# Patient Record
Sex: Female | Born: 1940
Health system: Southern US, Community
[De-identification: ages and names within clinical notes are randomized; demographics above are authoritative.]

## PROBLEM LIST (undated history)

## (undated) DIAGNOSIS — Z1211 Encounter for screening for malignant neoplasm of colon: Secondary | ICD-10-CM

## (undated) DIAGNOSIS — E7800 Pure hypercholesterolemia, unspecified: Secondary | ICD-10-CM

## (undated) DIAGNOSIS — N6459 Other signs and symptoms in breast: Secondary | ICD-10-CM

## (undated) DIAGNOSIS — K219 Gastro-esophageal reflux disease without esophagitis: Secondary | ICD-10-CM

## (undated) DIAGNOSIS — I480 Paroxysmal atrial fibrillation: Secondary | ICD-10-CM

## (undated) DIAGNOSIS — I2510 Atherosclerotic heart disease of native coronary artery without angina pectoris: Secondary | ICD-10-CM

## (undated) DIAGNOSIS — C801 Malignant (primary) neoplasm, unspecified: Secondary | ICD-10-CM

## (undated) DIAGNOSIS — IMO0002 Reserved for concepts with insufficient information to code with codable children: Secondary | ICD-10-CM

## (undated) DIAGNOSIS — I5189 Other ill-defined heart diseases: Secondary | ICD-10-CM

## (undated) DIAGNOSIS — Z9889 Other specified postprocedural states: Secondary | ICD-10-CM

## (undated) DIAGNOSIS — N3010 Interstitial cystitis (chronic) without hematuria: Secondary | ICD-10-CM

## (undated) DIAGNOSIS — Z1239 Encounter for other screening for malignant neoplasm of breast: Secondary | ICD-10-CM

## (undated) DIAGNOSIS — R55 Syncope and collapse: Secondary | ICD-10-CM

## (undated) DIAGNOSIS — Z87891 Personal history of nicotine dependence: Secondary | ICD-10-CM

## (undated) DIAGNOSIS — I10 Essential (primary) hypertension: Secondary | ICD-10-CM

## (undated) DIAGNOSIS — R112 Nausea with vomiting, unspecified: Secondary | ICD-10-CM

## (undated) HISTORY — DX: Personal history of nicotine dependence: Z87.891

## (undated) HISTORY — DX: Other ill-defined heart diseases: I51.89

## (undated) HISTORY — DX: Syncope and collapse: R55

## (undated) HISTORY — DX: Malignant (primary) neoplasm, unspecified: C80.1

## (undated) HISTORY — DX: Atherosclerotic heart disease of native coronary artery without angina pectoris: I25.10

## (undated) HISTORY — PX: APPENDECTOMY: SHX54

## (undated) HISTORY — DX: Pure hypercholesterolemia, unspecified: E78.00

## (undated) HISTORY — PX: CERVICAL DISCECTOMY: SHX98

## (undated) HISTORY — DX: Essential (primary) hypertension: I10

## (undated) HISTORY — DX: Interstitial cystitis (chronic) without hematuria: N30.10

## (undated) HISTORY — PX: DILATION AND CURETTAGE OF UTERUS: SHX78

## (undated) HISTORY — PX: MELANOMA EXCISION: SHX5266

## (undated) HISTORY — DX: Gastro-esophageal reflux disease without esophagitis: K21.9

## (undated) HISTORY — DX: Reserved for concepts with insufficient information to code with codable children: IMO0002

## (undated) HISTORY — DX: Encounter for other screening for malignant neoplasm of breast: Z12.39

## (undated) HISTORY — DX: Paroxysmal atrial fibrillation: I48.0

## (undated) HISTORY — DX: Other signs and symptoms in breast: N64.59

## (undated) HISTORY — DX: Encounter for screening for malignant neoplasm of colon: Z12.11

---

## 1988-09-29 HISTORY — PX: CHOLECYSTECTOMY: SHX55

## 1990-09-29 DIAGNOSIS — C801 Malignant (primary) neoplasm, unspecified: Secondary | ICD-10-CM

## 1990-09-29 HISTORY — PX: ABDOMINAL HYSTERECTOMY: SHX81

## 1990-09-29 HISTORY — DX: Malignant (primary) neoplasm, unspecified: C80.1

## 1999-12-11 ENCOUNTER — Encounter: Payer: Self-pay | Admitting: Emergency Medicine

## 1999-12-11 ENCOUNTER — Inpatient Hospital Stay (HOSPITAL_COMMUNITY): Admission: EM | Admit: 1999-12-11 | Discharge: 1999-12-12 | Payer: Self-pay | Admitting: Emergency Medicine

## 2000-08-05 ENCOUNTER — Observation Stay (HOSPITAL_COMMUNITY): Admission: RE | Admit: 2000-08-05 | Discharge: 2000-08-07 | Payer: Self-pay | Admitting: Neurosurgery

## 2000-09-04 ENCOUNTER — Encounter: Admission: RE | Admit: 2000-09-04 | Discharge: 2000-09-04 | Payer: Self-pay | Admitting: Neurosurgery

## 2000-12-04 ENCOUNTER — Encounter: Admission: RE | Admit: 2000-12-04 | Discharge: 2000-12-04 | Payer: Self-pay | Admitting: Neurosurgery

## 2004-11-21 ENCOUNTER — Ambulatory Visit: Payer: Self-pay | Admitting: Internal Medicine

## 2004-12-31 ENCOUNTER — Ambulatory Visit: Payer: Self-pay | Admitting: Internal Medicine

## 2005-06-04 ENCOUNTER — Ambulatory Visit: Payer: Self-pay | Admitting: Unknown Physician Specialty

## 2005-06-12 ENCOUNTER — Ambulatory Visit: Payer: Self-pay | Admitting: Unknown Physician Specialty

## 2005-11-06 ENCOUNTER — Ambulatory Visit: Payer: Self-pay | Admitting: Unknown Physician Specialty

## 2006-01-07 ENCOUNTER — Ambulatory Visit: Payer: Self-pay | Admitting: Internal Medicine

## 2006-06-25 ENCOUNTER — Ambulatory Visit: Payer: Self-pay | Admitting: Internal Medicine

## 2006-09-29 DIAGNOSIS — E7800 Pure hypercholesterolemia, unspecified: Secondary | ICD-10-CM

## 2006-09-29 HISTORY — DX: Pure hypercholesterolemia, unspecified: E78.00

## 2006-11-27 ENCOUNTER — Ambulatory Visit: Payer: Self-pay | Admitting: Urology

## 2007-01-25 ENCOUNTER — Ambulatory Visit: Payer: Self-pay | Admitting: Internal Medicine

## 2007-03-30 DIAGNOSIS — C44722 Squamous cell carcinoma of skin of right lower limb, including hip: Secondary | ICD-10-CM

## 2007-03-30 DIAGNOSIS — C4491 Basal cell carcinoma of skin, unspecified: Secondary | ICD-10-CM

## 2007-03-30 HISTORY — DX: Squamous cell carcinoma of skin of right lower limb, including hip: C44.722

## 2007-03-30 HISTORY — DX: Basal cell carcinoma of skin, unspecified: C44.91

## 2007-11-19 ENCOUNTER — Encounter (INDEPENDENT_AMBULATORY_CARE_PROVIDER_SITE_OTHER): Payer: Self-pay | Admitting: Urology

## 2007-11-19 ENCOUNTER — Ambulatory Visit (HOSPITAL_BASED_OUTPATIENT_CLINIC_OR_DEPARTMENT_OTHER): Admission: RE | Admit: 2007-11-19 | Discharge: 2007-11-19 | Payer: Self-pay | Admitting: Urology

## 2007-11-25 ENCOUNTER — Ambulatory Visit: Payer: Self-pay | Admitting: Internal Medicine

## 2008-02-18 ENCOUNTER — Ambulatory Visit: Payer: Self-pay | Admitting: Internal Medicine

## 2008-03-28 DIAGNOSIS — C4492 Squamous cell carcinoma of skin, unspecified: Secondary | ICD-10-CM

## 2008-03-28 HISTORY — DX: Squamous cell carcinoma of skin, unspecified: C44.92

## 2009-02-08 ENCOUNTER — Ambulatory Visit: Payer: Self-pay | Admitting: Internal Medicine

## 2009-02-21 ENCOUNTER — Ambulatory Visit: Payer: Self-pay | Admitting: Internal Medicine

## 2010-01-15 ENCOUNTER — Ambulatory Visit: Payer: Self-pay | Admitting: Unknown Physician Specialty

## 2010-05-20 ENCOUNTER — Ambulatory Visit: Payer: Self-pay | Admitting: Internal Medicine

## 2010-12-09 ENCOUNTER — Ambulatory Visit: Payer: Self-pay | Admitting: Internal Medicine

## 2011-02-11 NOTE — Op Note (Signed)
Rachael Austin, Rachael Austin NO.:  [id number]   MEDICAL RECORD NO.:  [id number]          PATIENT TYPE:  AMB   LOCATION:  [facility]                         FACILITY:  [facility]   PHYSICIAN:  Jamison Neighbor, M.D.  DATE OF BIRTH:  1941-01-06   DATE OF PROCEDURE:  11/19/2007  DATE OF DISCHARGE:                               OPERATIVE REPORT   PREOPERATIVE DIAGNOSIS:  Interstitial cystitis.   POSTOPERATIVE DIAGNOSIS:  Interstitial cystitis.   OPERATION PERFORMED:  1. Cystoscopy.  2. Urethral calibration.  3. Hydrodistention of the bladder with Marcaine and Pyridium      instillation.  4. Marcaine and Kenalog injection.  5. Bladder biopsy.   SURGEON:  Jamison Neighbor, M.D.   ANESTHESIA:  General.   COMPLICATIONS:  None.   DRAINS:  None.   BRIEF HISTORY:  This 70 year old female has had a tentative diagnosis of  interstitial cystitis made on clinical grounds.  She was initially seen  and evaluated by Dr. Assunta Gambles over in [place].  Review of the  records indicated that she had a pubovaginal sling placed for correction  of stress incontinence.  The patient has tried to for medication and has  had a whole lot of improvement.  She has not had  instillation therapy.  She would like to have a formal cystoscopy and hydrodistention to  determine if she does indeed have interstitial cystitis and to render  her a second opinion as to the best method of treatment that problem  if  it exists.  When she undergoes the evaluation we will also determine if  there are any complications or problems from the sling.  The patient  understood the risks and benefits of the procedure and gave full and  informed consent.   DESCRIPTION OF THE OPERATION:  After the successful induction of general  anesthesia the patient was placed in the dorsal lithotomy position, was  prepped with Betadine and draped in the usual sterile fashion.  Careful  bimanual examination revealed a well-supported  urethra with no signs of  over correction.  There was no angulation to the urethra.  There was no  cystocele, rectocele or enterocele to speak of.  There was no sign of  sling erosion and no erosion in the vagina.   The urethra was calibrated to 32 French 15-mm urethral sound with no  signs of stenosis or stricture.  The cystoscope was inserted.  The  bladder was carefully inspected.  No tumors or stones could be seen.  Both ureteral orifices were normal in configuration and location.  The  bladder neck appeared to be appropriately supported, but not overly  angulated.  There was no sign of any erosion of sling material into the  bladder.  Hydrodistention of the bladder was performed.  The bladder was  distended at a pressure of 100 cmH2O for 5 minutes and then the bladder  was drained.  Glomerulation was seen throughout the bladder.  The  bladder capacity with a thousand milliliters was very close to normal,  but clear cut ulcer formation and glomerulation were consistent with the  interstitial cystitis diagnosis.  It was felt, however, that  prognostically her good bladder capacity would portend a good long-term  result with appropriate medical therapy.   A bladder biopsy was done and was sent for mast cell analysis.  The  biopsy site was cauterized with a Bugbee electrode.  A mixture of  Marcaine and Pyridium was left in the bladder.  Marcaine and Kenalog  were injected paraurethrally.   The patient tolerated the procedure and was taken to recovery in good  condition.      Jamison Neighbor, M.D.  Electronically Signed     RJE/MEDQ  D:  11/19/2007  T:  11/20/2007  Job:  [id number]   cc:   Dale Bath  Fax: [phone]

## 2011-02-14 NOTE — Op Note (Signed)
[place]. [facility]  Patient:    Rachael Austin, Rachael Austin                     MRN: [id number] Proc. Date: 08/05/00 Adm. Date:  [id number] Attending:  Tressie Stalker D                           Operative Report  PREOPERATIVE DIAGNOSIS:  C7-T1 degenerative disease, herniated nucleus pulposus, spinal stenosis, spondylosis.  POSTOPERATIVE DIAGNOSIS: C7-T1 degenerative disease, herniated nucleus pulposus, spinal stenosis, spondylosis.  OPERATION:  C7-T1 anterior cervical diskectomy, interbody iliac crest Allograft arthrodesis, anterior cervical plating at C7-T1 (Codman titanium).  SURGEON:  Cristi Loron, M.D.  ASSISTANT:  Stefani Dama, M.D.  ANESTHESIA:  General endotracheal.  ESTIMATED BLOOD LOSS:  Less than 100.  SPECIMENS:  None.  DRAINS:  None.  COMPLICATIONS:  None.  BRIEF HISTORY:  The patient is a 70 year old white female who suffers from neck and left arm pain.  She failed medical management and was worked up with cervical MRI that demonstrated a herniated nucleus pulposus C7-T1 on the left. Her signs, symptoms and physical exam were consistent with a left C8 radiculopathy.  She therefore weighed the risks, benefits and alternatives of surgery and decided to proceed with surgery.  DESCRIPTION OF PROCEDURE:  The patient was brought to the operating room by the anesthesia team.  General endotracheal anesthesia was induced.  She remained in the supine position. A roll was placed under her shoulder to place the neck in slight extension.  Her anterior cervical region was then prepped with Betadine scrub and Betadine solution.  Sterile drapes were applied and I injected the area to be incised with Marcaine with epinephrine solution.  I used the scalpel to make a left sided transverse incision in her anterior neck.  I used Metzenbaum scissors to dissect down to the platysma muscle and divided it along the direction of the skin incision.  I then  dissected medial to the sternocleidomastoid muscle, jugular vein and carotid artery with the Metzenbaum scissors and then bluntly dissected down to the anterior cervical spine.  I carefully identified the esophagus and retracted it medially.  I carefully cleared the soft tissue from the anterior cervical spine using Kitner swabs.  I inserted a bent spinal needle into the upper exposed interspace and obtained intraoperative radiograph.  It demonstrated the needle was in the C6-7. I then turned my attention to the next lower interspace, at the C7-T1 interspace.  I used the electrocautery to detach the medial border of the longus colli muscle bilaterally from the C7-T1 interspace and then inserted the Caspar self retaining retractor for exposure.  Outside the C7-T1 intervertebral disc performed a partial diskectomy with pituitary forceps.  I then inserted distraction pin to the C7 and T1 vertebral body.  I distracted the interspace and then used the Midas Rex high speed drill to decorticate the vertebral end plates at J4-N8 and throw away the remainder of the intravertebral disc.  I thinned out the posterior longitudinal ligament with drill and incised it with arachnoid knife.  There was a large free fragment disc herniation on the left compressing the left C8 nerve root.  I removed several fragments with the pituitary forceps and then removed the ligamentum flavum with the Kerrison punch undercutting the vertebral end plates of C7 and T1 and performed a generous foraminotomy about the C8 nerve roots.  At this point, I had  a good decompression of the thecal sac and bilateral C8 nerve root.  I now turned my attention to arthrodesis.  I obtained a 7 mm iliac crest tricortical Allograft bone graft and fashioned it to these proximal dimensions, 7 mm in height and 1 cm in depth.  I then inserted the distracted C7-T1 interspace and then removed distraction pins. There was a good snug fit of the  bone graft.  I then obtained the appropriate length Codman anterior cervical plate, laid along the anterior aspect of C7-T1 drilled two holes at C7, two at T1, tapped the holes and secured the plate to the vertebral bodies with 12 mm screws.  I then obtained intraoperative radiograph.  It demonstrated good position of the plate and screws.  I then secured the screws to the plate with the camp tightener. I copiously irrigated the wound with Bacitracin solution and removed the solution and then removed the Caspar self retaining retractor.  Inspected the esophagus for any damage. There was none.  I then reapproximated the patients platysma muscle with interrupted 3-0 Vicryl and the subcutaneous tissue with interrupted 3-0 Vicryl and the skin with Steri-Strips and Benzoin.  The wound was then coated with Bacitracin ointment.  Sterile dressing was applied.  Drapes were removed and the patient was subsequently extubated by the anesthesia team and transported to the post-anesthesia care unit in stable condition.  All sponge, instrument and needle counts were correct at the end of the case. DD:  08/05/00 TD:  08/06/00 Job: [id number]

## 2011-02-14 NOTE — Discharge Summary (Signed)
[place]. [facility]  Patient:    Rachael Austin, Rachael Austin                     MRN: [id number] Adm. Date:  [id number] Disc. Date: [id number] Attending:  Tressie Stalker D                           Discharge Summary  ADMISSION DIAGNOSIS:  Left arm pain secondary to a displaced disk C7-T1 on the left side.  PROCEDURES:  Anterior cervical diskectomy and arthrodesis C7-T1 with Codman plate and iliac crest wedge 7 mm allograft.  COMPLICATIONS:  None.  CONDITION ON DISCHARGE:  Alive and well.  MEDICATIONS:  Vicodin 1-2 p.o. q.4h. for pain.  HOSPITAL COURSE:  [name] was admitted secondary to pain in the left upper extremity due to a displaced disk at C7-T1.  Conservative treatment did not alleviate the pain.  It was, therefore, recommended that she undergo an anterior cervical diskectomy and arthrodesis.  She was admitted to the hospital on August 05, 2000, and had an uncomplicated procedure. Postoperatively she did quite well, having only some minor anterior chest wall pain as a result of the dissection for operation.  At the time of discharge, that has resolved.  Her wound is clean, dry, and without signs of infection. She is moving upper and lower extremities well.  Her voice is normal at the time of discharge.  [name] was given instructions of no lifting, bending, twisting, or driving.  She will have a return appointment to see Dr. Lovell Sheehan in two to three weeks. DD:  08/07/00 TD:  08/08/00 Job: [id number]

## 2011-02-14 NOTE — H&P (Signed)
[place]. [facility]  Patient:    Rachael Austin, Rachael Austin                     MRN: [id number] Adm. Date:  [id number] Disc. Date: [id number] Attending:  Tressie Stalker D                         History and Physical  CHIEF COMPLAINT: Left arm pain.  HISTORY OF PRESENT ILLNESS: The patient is a 70 year old white female who complains of a several month history of severe left arm pain.  She failed medical management and was worked up with a cervical MRI that demonstrated herniated disk at C7-T1, and she therefore weighed the risks and benefits and alternatives of surgery and decided to proceed with anterior cervical diskectomy and fusion with plating.  PAST MEDICAL HISTORY:  1. Melanoma in 1994.  2. Remote history of cholecystitis.  PAST SURGICAL HISTORY:  1. Colonoscopy in June 2001.  2. Removal of melanoma in 1994.  3. Cholecystectomy in 1991.  4. Hysterectomy in 1992.  MEDICATIONS PRIOR TO ADMISSION:  1. Prevacid 30 mg p.o. q.d.  2. Premarin 1 p.o. q.d.  3. OxyContin 20 mg p.o. b.i.d.  4. Hydrocodone p.r.n.  ALLERGIES: CODEINE causes nausea.  FAMILY HISTORY: The patients mother died at the age of 77 secondary to Hodgkins disease and melanoma.  The patients father died at the age of 86 secondary to emphysema and heart disease.  SOCIAL HISTORY: The patient is married.  She has two daughters.  [place]  She denies tobacco, ethanol, and drug use.  She is employed as a Geologist, engineering.  REVIEW OF SYSTEMS: Negative except as above.  PHYSICAL EXAMINATION:  GENERAL: The patient is a pleasant, well-developed, well-nourished 70 year old white female complaining of left arm pain.  VITAL SIGNS: Height 5 feet 6 inches.  Weight 150 pounds.  HEENT: Normal.  NECK: Supple.  No masses, deformities, tracheal deviation, jugular venous distention, or carotid bruits.  She has moderately limited cervical range of motion.  Spurling  test positive on the left, negative on the right. Lhermittes sign present.  THORAX: Symmetric.  LUNGS: Clear to auscultation.  HEART: Regular rate and rhythm.  ABDOMEN: Soft, nontender.  EXTREMITIES: No obvious deformities.  BACK: Examination normal.  NEUROLOGIC: The patient is alert and oriented x 3.  Cranial nerves 2-12 grossly intact bilaterally except she had some chronic decreased visual acuity of the left eye.  Hearing grossly normal bilaterally.  Motor strength 5/5 in bilateral deltoid, biceps, triceps, wrist extensors, psoas, quadriceps, gastrocnemius, extensor hallucis longus, right hand grip, and interosseous. Left hand grip and interosseous strength is diminished at 4/5.  Cerebellar examination intact with rapid alternating movements of the upper extremities bilaterally.  Sensory examination demonstrates decreased light touch sensation in the left C8 distribution, otherwise normal.  IMAGING STUDIES: Cervical MRI was performed at [facility] on July 16, 2000 and demonstrated a large left-sided herniated nucleus pulposus at C7-T1 on the left.  She had some mild spondylosis and spinal stenosis at C5-6, C6-7.  ASSESSMENT/PLAN: C7-T1 herniated nucleus pulposus, spondylosis, stenosis, degenerative disk disease, C8 radiculopathy.  I discussed the situation with the patient and her husband and reviewed the MRI scan and pointed out the abnormalities.  With her signs and symptoms and physical examination with left C8 radiculopathy I discussed the options including continued medical management, doing nothing, and surgery.  I described the surgical  options of both anterior and posterior surgery and recommended that if she was to proceed with surgery she should consider a C7-T1 anterior cervical diskectomy with fusion and plating.  I described the surgical procedure, showed her surgical models, and discussed the risks of the surgery extensively.  The  patient weighed the risks and benefits and alternatives of surgery and elected to proceed with a C7-T1 anterior cervical diskectomy with fusion and plating on August 05, 2000. DD:  08/05/00 TD:  08/06/00 Job: [id number]

## 2011-02-14 NOTE — Discharge Summary (Signed)
[place]. [facility]  Patient:    Rachael Austin, Rachael Austin                     MRN: [id number] Adm. Date:  [id number] Disc. Date: 12/12/99 Attending:  Heber  Dictator:   Lavella Hammock, P.A.-C. CC:         Dale Richardson, M.D.                           Discharge Summary  DATE OF BIRTH:  1940-10-22  PROCEDURE:  Stress echocardiogram.  HISTORY OF PRESENT ILLNESS:  Ms. Selle is a 70 year old female with no known  coronary artery disease, who was seen in the office by Dr. Lewayne Bunting on November 29, 1999, for chest pain and scheduled for a stress echocardiogram.  She had recurrent chest pain, however, and presented to the emergency room and was admitted for further evaluation from there.  She was pain-free at the time of admission. She ruled out for a myocardial infarction, and the next day had a stress echocardiogram which was negative for ischemia.  She reached stage 4 on the exercise protocol nd her heart rate was into the target range.  She had good blood pressure response to exertion and had no chest pain and no significant electrocardiogram changes, and no wall motion abnormalities.  With enzymes negative for a myocardial infarction and a stress echocardiogram negative for ischemia, she was considered stable for discharge on December 12, 1999.  LABORATORY DATA:  CPK-MB and troponin I serially negative for a myocardial infarction.  D-dimer 0.15.  Sodium 140, potassium 3.8, chloride 104, CO2 of 25, BUN 7, creatinine 0.7, glucose 111.  Hemoglobin 12.2, hematocrit 34.5, WBC 6.0, platelets 252.  CONDITION ON DISCHARGE:  Stable.  CONSULTATION:  None.  COMPLICATIONS:  None.  DISCHARGE DIAGNOSES: 1. Chest pain, no ischemia by stress echocardiogram and no myocardial infarction    by enzymes.  FOLLOWUP:  With Dr. Lewayne Bunting and with Dr. Dale Lakota.  2. Degenerative joint disease. 3. Occasional sinusitis. 4. History of  gastroesophageal reflux disease symptoms. 5. History of appendectomy, cholecystectomy, D&C, hysterectomy, and melanoma    removal.  DISCHARGE INSTRUCTIONS: 1. Her activity is not restricted. 2. She is to stick to a low-fat diet. 3. She is to call Dr. Lorin Picket for a follow-up appointment. 4. She will be set up with an appointment with Dr. Andee Lineman prior to discharge.  DISCHARGE MEDICATIONS: 1. Zantac 150 mg b.i.d. 2. Coated aspirin 81 mg q.d. 3. Premarin 0.625 mg q.d. 4. Multivitamin q.d. 5. Garlic pills q.d. DD:  12/12/99 TD:  12/12/99 Job: [id number]

## 2011-06-17 ENCOUNTER — Ambulatory Visit: Payer: Self-pay | Admitting: Internal Medicine

## 2011-06-17 LAB — HM MAMMOGRAPHY

## 2011-06-20 LAB — POCT HEMOGLOBIN-HEMACUE
Hemoglobin: 14.6
Operator id: 268271

## 2011-07-22 LAB — FECAL OCCULT BLOOD, GUAIAC: Fecal Occult Blood: NEGATIVE

## 2011-09-30 DIAGNOSIS — N6459 Other signs and symptoms in breast: Secondary | ICD-10-CM

## 2011-09-30 DIAGNOSIS — Z1239 Encounter for other screening for malignant neoplasm of breast: Secondary | ICD-10-CM

## 2011-09-30 HISTORY — DX: Other signs and symptoms in breast: N64.59

## 2011-09-30 HISTORY — DX: Encounter for other screening for malignant neoplasm of breast: Z12.39

## 2011-12-16 LAB — LIPID PANEL: Cholesterol: 182 mg/dL (ref 0–200)

## 2012-07-06 ENCOUNTER — Telehealth: Payer: Self-pay | Admitting: Internal Medicine

## 2012-07-06 ENCOUNTER — Ambulatory Visit: Payer: Self-pay | Admitting: Internal Medicine

## 2012-07-06 DIAGNOSIS — N644 Mastodynia: Secondary | ICD-10-CM

## 2012-07-06 NOTE — Telephone Encounter (Signed)
Rachael Austin is calling saying that she is having pain at the 2 o'clock position on left Austin. Delford Field wants to know if you could put in Diagnostic Mammo order and Ultrasound Order for patient.

## 2012-07-06 NOTE — Telephone Encounter (Signed)
I put in the order for the diagnostic mammo and ultrasound

## 2012-07-09 ENCOUNTER — Telehealth: Payer: Self-pay | Admitting: Internal Medicine

## 2012-07-09 NOTE — Telephone Encounter (Signed)
Please notify pt that mammogram and ultrasound per radiology read - no significant abnormality.  I would like to get her in for a follow up breast exam.  I can work her in next week or if she desires - can do at the 08/09/12 appt.

## 2012-07-16 NOTE — Telephone Encounter (Signed)
Left message at home number for pt to return call.

## 2012-07-16 NOTE — Telephone Encounter (Signed)
Left message on mobile # to call back. 

## 2012-07-21 NOTE — Telephone Encounter (Signed)
Called patient, informed her about results and also about breast exam. Patient stated that she would rather come next week. Will call patient back with new appointment time.

## 2012-08-04 NOTE — Telephone Encounter (Signed)
Called pt back.  She is ok with waiting until her appt 08/09/12.

## 2012-08-06 ENCOUNTER — Encounter: Payer: Self-pay | Admitting: *Deleted

## 2012-08-09 ENCOUNTER — Ambulatory Visit (INDEPENDENT_AMBULATORY_CARE_PROVIDER_SITE_OTHER): Payer: Medicare Other | Admitting: Internal Medicine

## 2012-08-09 ENCOUNTER — Encounter: Payer: Self-pay | Admitting: Internal Medicine

## 2012-08-09 VITALS — BP 151/82 | HR 82 | Temp 98.2°F | Ht 64.0 in | Wt 152.0 lb

## 2012-08-09 DIAGNOSIS — R5381 Other malaise: Secondary | ICD-10-CM

## 2012-08-09 DIAGNOSIS — N3010 Interstitial cystitis (chronic) without hematuria: Secondary | ICD-10-CM

## 2012-08-09 DIAGNOSIS — E7800 Pure hypercholesterolemia, unspecified: Secondary | ICD-10-CM | POA: Insufficient documentation

## 2012-08-09 DIAGNOSIS — N644 Mastodynia: Secondary | ICD-10-CM

## 2012-08-09 DIAGNOSIS — Z139 Encounter for screening, unspecified: Secondary | ICD-10-CM

## 2012-08-09 DIAGNOSIS — R5383 Other fatigue: Secondary | ICD-10-CM

## 2012-08-09 LAB — COMPREHENSIVE METABOLIC PANEL
ALT: 14 U/L (ref 0–35)
AST: 17 U/L (ref 0–37)
Alkaline Phosphatase: 63 U/L (ref 39–117)
CO2: 28 mEq/L (ref 19–32)
Sodium: 140 mEq/L (ref 135–145)
Total Bilirubin: 0.6 mg/dL (ref 0.3–1.2)
Total Protein: 6.8 g/dL (ref 6.0–8.3)

## 2012-08-09 LAB — LIPID PANEL
HDL: 68.8 mg/dL (ref >39.00)
Total CHOL/HDL Ratio: 3
Triglycerides: 63 mg/dL (ref 0.0–149.0)

## 2012-08-09 LAB — CBC WITH DIFFERENTIAL/PLATELET
Basophils Absolute: 0 10*3/uL (ref 0.0–0.1)
Eosinophils Absolute: 0.1 10*3/uL (ref 0.0–0.7)
HCT: 40.4 % (ref 36.0–46.0)
Lymphs Abs: 1.5 10*3/uL (ref 0.7–4.0)
Monocytes Absolute: 0.5 10*3/uL (ref 0.1–1.0)
Monocytes Relative: 10.1 % (ref 3.0–12.0)
Platelets: 216 10*3/uL (ref 150.0–400.0)
RDW: 13.8 % (ref 11.5–14.6)

## 2012-08-09 NOTE — Progress Notes (Signed)
Subjective:    Patient ID: ONYX EDGLEY, female    DOB: 1940/11/12, 71 y.o.   MRN: [id number]  HPI 71 year old female with past history of hypercholesterolemia and interstitial cystitis who comes in today to follow up on these issues as well as for a complete physical exam.  States overall she has been doing relatively well.  Increased stress with her husband's medical issues.  She does not feel she needs any further intervention at this point.  She has noticed some discomfort - left breast.  States she has noticed more fullness of the breast for a while, but recently noticed some increased discomfort and what she describes as a lump.  She also has noticed some "wetness" in her bra.  Has not been able to express anything from her nipple.  No erythema.    Past Medical History  Diagnosis Date  . Degenerative disk disease   . Hypercholesterolemia   . GERD (gastroesophageal reflux disease)   . Interstitial cystitis     followed by Dr Achilles Dunk    Review of Systems Patient denies any headache, lightheadedness or dizziness.  No significant sinus or allergy symptoms.  No chest pain, tightness or palpitations.  No increased shortness of breath, cough or congestion.  No nausea or vomiting.  No abdominal pain or cramping.  No bowel change, such as diarrhea, constipation, BRBPR or melana.  No urine change.  States her bladder is stable.  Continues to follow with Dr Achilles Dunk.        Objective:   Physical Exam Filed Vitals:   08/09/12 0801  BP: 151/82  Pulse: 82  Temp: 98.2 F (36.8 C)   Blood pressure recheck:  58/69  71 year old female in no acute distress.   HEENT:  Nares- clear.  Oropharynx - without lesions. NECK:  Supple.  Nontender.  No audible bruit.  HEART:  Appears to be regular. LUNGS:  No crackles or wheezing audible.  Respirations even and unlabored.  RADIAL PULSE:  Equal bilaterally.    BREASTS:  No nipple discharge or nipple retraction present.  Could not appreciate any distinct  nodules or axillary adenopathy.  She did have some increased tissue fullness - 2 o'clock region left breast (outer edge).   ABDOMEN:  Soft, nontender.  Bowel sounds present and normal.  No audible abdominal bruit.  GU:  Normal external genitalia.  Vaginal vault without lesions.  S/P hysterectomy.  Could not appreciate any adnexal masses or tenderness.   RECTAL:  Heme negative.   EXTREMITIES:  No increased edema present.  DP pulses palpable and equal bilaterally.          Assessment & Plan:  BREAST TENDERNESS.  Some fullness on exam.  No distinct nodule palpated.  Recent mammogram and ultrasound - negative.  Discussed with pt today - regarding options.  Will refer to Dr Lemar Livings for evaluation.    INCREASED PSYCHOSOCIAL STRESSORS.  She feels she is handling things relatively well.  Does not feel she needs anything more at this point.  Follow.  Will let me know if she desires or needs any further intervention.    ELEVATED BLOOD PRESSURE.  Blood pressure on recheck today wnl.  Follow.    FATIGUE.  Did report some fatigue.  May be related to the increased stress.  States she is having to do everything around the house.  Will check cbc, met c and tsh to confirm no underlying metabolic etiology.   HEALTH MAINTENANCE.  Physical today.  Is s/p hysterectomy and does not require yearly pap smears.  Overdue colonoscopy.  Will schedule an appt with GI for screening.  She has declined bone density.

## 2012-08-09 NOTE — Patient Instructions (Addendum)
It was nice seeing you today. We are going to get an appt scheduled with Dr Lemar Livings - to evaluate your breast.  I am also going to refer you back to GI for you screening colonoscopy.  Let me know if you need anything.

## 2012-08-10 NOTE — Assessment & Plan Note (Signed)
Stable.  Following with Dr.  Cope.    

## 2012-08-10 NOTE — Assessment & Plan Note (Signed)
Low cholesterol diet and exercise.  Continue lovastatin.  Check lipid panel and liver panel today.

## 2012-10-18 ENCOUNTER — Encounter: Payer: Self-pay | Admitting: Internal Medicine

## 2012-12-11 ENCOUNTER — Encounter: Payer: Self-pay | Admitting: General Surgery

## 2013-01-14 ENCOUNTER — Other Ambulatory Visit: Payer: Self-pay | Admitting: Internal Medicine

## 2013-01-14 MED ORDER — OMEPRAZOLE 20 MG PO CPDR: 20.0000 mg | DELAYED_RELEASE_CAPSULE | Freq: Two times a day (BID) | ORAL | Status: DC

## 2013-01-14 NOTE — Progress Notes (Signed)
Refilled omeprazole x 5

## 2013-01-17 NOTE — Telephone Encounter (Signed)
Rx sent to pharmacy by escript  

## 2013-01-18 ENCOUNTER — Telehealth: Payer: Self-pay

## 2013-01-24 ENCOUNTER — Encounter: Payer: Self-pay | Admitting: *Deleted

## 2013-01-24 DIAGNOSIS — R251 Tremor, unspecified: Secondary | ICD-10-CM | POA: Insufficient documentation

## 2013-01-28 NOTE — Telephone Encounter (Signed)
Opened in error

## 2013-02-04 DIAGNOSIS — N3281 Overactive bladder: Secondary | ICD-10-CM | POA: Insufficient documentation

## 2013-02-08 ENCOUNTER — Ambulatory Visit (INDEPENDENT_AMBULATORY_CARE_PROVIDER_SITE_OTHER): Payer: Medicare Other | Admitting: Internal Medicine

## 2013-02-08 ENCOUNTER — Encounter: Payer: Self-pay | Admitting: Internal Medicine

## 2013-02-08 ENCOUNTER — Telehealth: Payer: Self-pay | Admitting: Internal Medicine

## 2013-02-08 VITALS — BP 130/80 | HR 75 | Temp 98.1°F | Ht 64.0 in | Wt 155.5 lb

## 2013-02-08 DIAGNOSIS — E7800 Pure hypercholesterolemia, unspecified: Secondary | ICD-10-CM

## 2013-02-08 DIAGNOSIS — N3010 Interstitial cystitis (chronic) without hematuria: Secondary | ICD-10-CM

## 2013-02-08 MED ORDER — LOVASTATIN 20 MG PO TABS: 20.0000 mg | ORAL_TABLET | Freq: Every day | ORAL | Status: DC

## 2013-02-08 MED ORDER — OMEPRAZOLE 20 MG PO CPDR: DELAYED_RELEASE_CAPSULE | ORAL | Status: DC

## 2013-02-14 ENCOUNTER — Other Ambulatory Visit: Payer: Medicare Other

## 2013-02-16 ENCOUNTER — Encounter: Payer: Self-pay | Admitting: General Surgery

## 2013-02-16 ENCOUNTER — Other Ambulatory Visit (INDEPENDENT_AMBULATORY_CARE_PROVIDER_SITE_OTHER): Payer: Medicare Other

## 2013-02-16 ENCOUNTER — Ambulatory Visit (INDEPENDENT_AMBULATORY_CARE_PROVIDER_SITE_OTHER): Payer: Medicare Other | Admitting: General Surgery

## 2013-02-16 VITALS — BP 130/72 | HR 72 | Resp 14 | Ht 64.0 in | Wt 155.0 lb

## 2013-02-16 DIAGNOSIS — N644 Mastodynia: Secondary | ICD-10-CM

## 2013-02-16 DIAGNOSIS — E7800 Pure hypercholesterolemia, unspecified: Secondary | ICD-10-CM

## 2013-02-16 LAB — HEPATIC FUNCTION PANEL
AST: 17 U/L (ref 0–37)
Albumin: 4.1 g/dL (ref 3.5–5.2)

## 2013-02-16 LAB — LIPID PANEL
HDL: 64.4 mg/dL (ref >39.00)
LDL Cholesterol: 119 mg/dL — ABNORMAL HIGH (ref 0–99)
Total CHOL/HDL Ratio: 3
Triglycerides: 62 mg/dL (ref 0.0–149.0)
VLDL: 12.4 mg/dL (ref 0.0–40.0)

## 2013-02-16 NOTE — Progress Notes (Signed)
[name], female   DOB: 06/24/1941, 72 y.o.   MRN: [id number]  No chief complaint on file.   HPI Rachael Austin is a 72 y.o. female.  Patient here today for follow up left breast evaluation.  No new breast issues and still has soreness/aching in left breast upper outer which is ongoing. She was here in Nov 2013 for similar symptoms.  No nipple drainage. Denies any family history of breast cancer.  The discomfort the patient reported in October 2013 is still present, although less prominent than at that time. She notes that she is more likely to have discomfort when she wears her bra than not. Underwire bra seem to be particularly uncomfortable. There is a significant size discrepancy in her breast, and she normally fits her brought [place] to the smaller breast raising the possibility that compression is aggravating her discomfort.  HPI  Past Medical History  Diagnosis Date  . Degenerative disk disease   . GERD (gastroesophageal reflux disease)   . Interstitial cystitis     followed by Dr Achilles Dunk  . Cancer 1992    skin  . Hypercholesterolemia 2008  . Personal history of tobacco use, presenting hazards to health   . Breast screening, unspecified 2013  . Special screening for malignant neoplasms, colon   . Other sign and symptom in breast 2013    Left upper outer quadrant breast "soreness" Ultrasound exam of right breast in the 2 o'clock position with the breast distracted medially showed a 0.3-0.4 with 0.5 cm simple cyst. In the 1 o'clock position where pt reported tenderness US exam was negatiive.  Because of her history of intermittent nipple drainage, ultrasound was completed of the retroareolar area.    Past Surgical History  Procedure Laterality Date  . Cervical discectomy      S/P C7-T1 discectomy with fusion  . Appendectomy    . Cholecystectomy  1990  . Dilation and curettage of uterus    . Abdominal hysterectomy  1992  . Melanoma excision      removed from  Left calf 1994    Family History  Problem Relation Age of Onset  . Hodgkin's lymphoma Mother   . Heart failure Father   . Heart attack Father   . Arthritis Sister     Three sisters w/ degeneratve disk disease  . Headache Sister     Two sisters hx of headache  . Breast cancer Neg Hx   . Colon cancer Neg Hx     Social History History  Substance Use Topics  . Smoking status: Former Smoker -- 1.00 packs/day for 15 years    Types: Cigarettes  . Smokeless tobacco: Never Used  . Alcohol Use: No    Allergies  Allergen Reactions  . Augmentin (Amoxicillin-Pot Clavulanate) Swelling and Rash    Swelling of the lips  . Codeine Sulfate Other (See Comments)    "Makes her hyper"    Current Outpatient Prescriptions  Medication Sig Dispense Refill  . aspirin EC 81 MG tablet Take 81 mg by mouth daily.      Marland Kitchen imipramine (TOFRANIL) 25 MG tablet       . lovastatin (MEVACOR) 20 MG tablet Take 1 tablet (20 mg total) by mouth at bedtime.  90 tablet  3  . Multiple Vitamin (MULTI-VITAMIN DAILY PO) Take 1 tablet by mouth daily.      Marland Kitchen omeprazole (PRILOSEC) 20 MG capsule Take one per day  90 capsule  3   No  current facility-administered medications for this visit.    Review of Systems Review of Systems  Constitutional: Negative.   Respiratory: Negative.   Cardiovascular: Negative.    the patient reports no further nipple drainage.  Blood pressure 130/72, pulse 72, resp. rate 14, height 5\' 4"  (1.626 m), weight 155 lb (70.308 kg), last menstrual period 08/09/2012.  Physical Exam Physical Exam  Constitutional: She is oriented to person, place, and time. She appears well-developed and well-nourished.  Cardiovascular: Normal rate and regular rhythm.   Pulmonary/Chest: Effort normal and breath sounds normal. Right breast exhibits no inverted nipple, no mass, no nipple discharge, no skin change and no tenderness. Left breast exhibits tenderness. Left breast exhibits no inverted nipple, no mass,  no nipple discharge and no skin change.  Lymphadenopathy:    She has no cervical adenopathy.    She has no axillary adenopathy.  Neurological: She is alert and oriented to person, place, and time.  Skin: Skin is warm and dry.  Lateral tenderness along pecterlaterious muscle lateral to the left breast  Left > right breast 1 cup size.   Data Reviewed 2013 mammograms were reviewed.  Assessment    Chest wall/mild breast discomfort, improving.    Plan    The patient reports that she has had her brought [place] that in the past. She is encouraged to be sure a copy large enough to encompass a larger breast is worn.  With no point tenderness within the breast and no palpable abnormality or persistent nipple drainage, followup here will be on as-needed basis. She should resume screening mammograms in October 2014 with her primary care provider.       Rachael Austin 02/16/2013, 1:43 PM

## 2013-02-16 NOTE — Patient Instructions (Addendum)
Continue self breast exams. Call office for any new breast issues or concerns. 

## 2013-02-17 ENCOUNTER — Encounter: Payer: Self-pay | Admitting: *Deleted

## 2013-02-21 ENCOUNTER — Encounter: Payer: Self-pay | Admitting: Internal Medicine

## 2013-02-21 NOTE — Progress Notes (Signed)
Subjective:    [name], female    DOB: 10/17/1940, 72 y.o.   MRN: [id number]  HPI 73 year old female with past history of hypercholesterolemia and interstitial cystitis who comes in today for a scheduled follow up.  States overall she has been doing relatively well.  Increased stress with her husband's medical issues.  She does not feel she needs any further intervention at this point. Eating and drinking well.  Bowels stable.  Feels things are stable from a urinary standpoint.  On imipramine.  Sees Dr Achilles Dunk.  Had seen Dr Lemar Livings for her breast.  He had recommended a 6 month f/u.  She did not feel she needed to f/u.  Discussed with her today.  She is in agreement for a f/u.  Overall she feels things are stable.    Past Medical History  Diagnosis Date  . Degenerative disk disease   . GERD (gastroesophageal reflux disease)   . Interstitial cystitis     followed by Dr Achilles Dunk  . Cancer 1992    skin  . Hypercholesterolemia 2008  . Personal history of tobacco use, presenting hazards to health   . Breast screening, unspecified 2013  . Special screening for malignant neoplasms, colon   . Other sign and symptom in breast 2013    Left upper outer quadrant breast "soreness" Ultrasound exam of right breast in the 2 o'clock position with the breast distracted medially showed a 0.3-0.4 with 0.5 cm simple cyst. In the 1 o'clock position where pt reported tenderness US exam was negatiive.  Because of her history of intermittent nipple drainage, ultrasound was completed of the retroareolar area.    Outpatient Encounter Prescriptions as of 02/08/2013  Medication Sig Dispense Refill  . aspirin EC 81 MG tablet Take 81 mg by mouth daily.      Marland Kitchen lovastatin (MEVACOR) 20 MG tablet Take 1 tablet (20 mg total) by mouth at bedtime.  90 tablet  3  . Multiple Vitamin (MULTI-VITAMIN DAILY PO) Take 1 tablet by mouth daily.      Marland Kitchen omeprazole (PRILOSEC) 20 MG capsule Take one per day  90 capsule  3   . [DISCONTINUED] lovastatin (MEVACOR) 20 MG tablet TAKE ONE TABLET BY MOUTH EVERY NIGHT AT BEDTIME FOR CHOLESTEROL  30 tablet  0  . [DISCONTINUED] omeprazole (PRILOSEC) 20 MG capsule Take 1 capsule (20 mg total) by mouth 2 (two) times daily.  60 capsule  4   No facility-administered encounter medications on file as of 02/08/2013.    Review of Systems Patient denies any headache, lightheadedness or dizziness.  No significant sinus or allergy symptoms.  No chest pain, tightness or palpitations.  No increased shortness of breath, cough or congestion.  No nausea or vomiting.  No abdominal pain or cramping.  No bowel change, such as diarrhea, constipation, BRBPR or melana.  No urine change.  States her bladder is stable.  Continues to follow with Dr Achilles Dunk.        Objective:   Physical Exam  Filed Vitals:   02/08/13 1107  BP: 130/80  Pulse: 75  Temp: 98.1 F (60.13 C)   72 year old female in no acute distress.   HEENT:  Nares- clear.  Oropharynx - without lesions. NECK:  Supple.  Nontender.  No audible bruit.  HEART:  Appears to be regular. LUNGS:  No crackles or wheezing audible.  Respirations even and unlabored.  RADIAL PULSE:  Equal bilaterally.   ABDOMEN:  Soft, nontender.  Bowel sounds present and normal.  No audible abdominal bruit.    EXTREMITIES:  No increased edema present.  DP pulses palpable and equal bilaterally.          Assessment & Plan:  BREAST EXAM.  Will schedule her six month f/u with Dr Lemar Livings.  To discuss with him regarding need for futher w/up.    INCREASED PSYCHOSOCIAL STRESSORS.  She feels she is handling things relatively well.  Does not feel she needs anything more at this point.  Follow.  Will let me know if she desires or needs any further intervention.    HEALTH MAINTENANCE.  Physical 08/09/12.  Is s/p hysterectomy and does not require yearly pap smears.  Overdue colonoscopy.  Was referred to GI last visit for colon screening.  She has declined bone density.   Mammogram as outlined above.  Will see Dr Lemar Livings.

## 2013-02-21 NOTE — Assessment & Plan Note (Signed)
Low cholesterol diet and exercise.  Continue lovastatin.  Follow lipid panel and liver function.   

## 2013-02-21 NOTE — Assessment & Plan Note (Signed)
Stable.  Following with Dr.  Cope.    

## 2013-03-08 ENCOUNTER — Encounter: Payer: Self-pay | Admitting: Internal Medicine

## 2013-05-02 ENCOUNTER — Encounter: Payer: Self-pay | Admitting: Adult Health

## 2013-05-02 ENCOUNTER — Ambulatory Visit (INDEPENDENT_AMBULATORY_CARE_PROVIDER_SITE_OTHER): Payer: Medicare Other | Admitting: Adult Health

## 2013-05-02 VITALS — BP 140/66 | HR 79 | Temp 98.2°F | Resp 12 | Wt 155.0 lb

## 2013-05-02 DIAGNOSIS — T148 Other injury of unspecified body region: Secondary | ICD-10-CM

## 2013-05-02 DIAGNOSIS — W57XXXA Bitten or stung by nonvenomous insect and other nonvenomous arthropods, initial encounter: Secondary | ICD-10-CM

## 2013-05-02 DIAGNOSIS — R5381 Other malaise: Secondary | ICD-10-CM

## 2013-05-02 DIAGNOSIS — R5383 Other fatigue: Secondary | ICD-10-CM

## 2013-05-02 DIAGNOSIS — J329 Chronic sinusitis, unspecified: Secondary | ICD-10-CM

## 2013-05-02 LAB — BASIC METABOLIC PANEL
CO2: 28 mEq/L (ref 19–32)
Calcium: 9.4 mg/dL (ref 8.4–10.5)
Sodium: 140 mEq/L (ref 135–145)

## 2013-05-02 LAB — CBC WITH DIFFERENTIAL/PLATELET
Basophils Relative: 0.6 % (ref 0.0–3.0)
Eosinophils Absolute: 0 10*3/uL (ref 0.0–0.7)
Hemoglobin: 13.4 g/dL (ref 12.0–15.0)
Lymphocytes Relative: 30.8 % (ref 12.0–46.0)
MCHC: 32.9 g/dL (ref 30.0–36.0)
Monocytes Relative: 8.7 % (ref 3.0–12.0)
Neutro Abs: 4 10*3/uL (ref 1.4–7.7)
RBC: 4.46 Mil/uL (ref 3.87–5.11)

## 2013-05-02 MED ORDER — DOXYCYCLINE HYCLATE 100 MG PO TABS: 100.0000 mg | ORAL_TABLET | Freq: Two times a day (BID) | ORAL | Status: DC

## 2013-05-02 NOTE — Progress Notes (Signed)
  Subjective:    [name], female    DOB: [birth year], 72 y.o.   MRN: [id number]  HPI  Patient reports having a tick bite approximately 2 weeks ago. She was bit on the right buttock. Her husband noticed it. She denies ever getting a rash or fever with this tick bite. She has reported decreased energy, stomach upset, HA. She reports these symptoms began approximately 1 week ago. She has not started any new medications. No changes in diet.   Additionally she is reporting left ear fullness, sinus drainage. She began to notice this several days ago. She has yellow drainage. Muffled sound in her ear especially when swallowing.    Current Outpatient Prescriptions on File Prior to Visit  Medication Sig Dispense Refill  . aspirin EC 81 MG tablet Take 81 mg by mouth daily.      Marland Kitchen imipramine (TOFRANIL) 25 MG tablet Take 25 mg by mouth at bedtime.       . lovastatin (MEVACOR) 20 MG tablet Take 1 tablet (20 mg total) by mouth at bedtime.  90 tablet  3  . Multiple Vitamin (MULTI-VITAMIN DAILY PO) Take 1 tablet by mouth daily.      Marland Kitchen omeprazole (PRILOSEC) 20 MG capsule Take one per day  90 capsule  3   No current facility-administered medications on file prior to visit.      Review of Systems  Constitutional: Positive for fatigue.  HENT: Positive for ear pain, postnasal drip and sinus pressure.   Respiratory: Negative.  Negative for cough.   Cardiovascular: Negative.   Gastrointestinal:       Occasional stomach upset  Genitourinary: Negative.   Neurological: Positive for light-headedness and headaches. Negative for dizziness, tremors, weakness and numbness.  Psychiatric/Behavioral: Negative.      BP 140/66  Pulse 79  Temp(Src) 98.2 F (36.8 C) (Oral)  Resp 12  Wt 155 lb (70.308 kg)  BMI 26.59 kg/m2  SpO2 97%  LMP 08/09/2012    Objective:   Physical Exam  Constitutional: She is oriented to person, place, and time. She appears well-developed and well-nourished.   HENT:  Head: Normocephalic and atraumatic.  Pharyngeal drainage. Mild erythema without exudate.  Cardiovascular: Normal rate, regular rhythm, normal heart sounds and intact distal pulses.  Exam reveals no gallop and no friction rub.   No murmur heard. Pulmonary/Chest: Effort normal and breath sounds normal. No respiratory distress. She has no wheezes. She has no rales.  Abdominal: Soft. Bowel sounds are normal.  Musculoskeletal: Normal range of motion.  Lymphadenopathy:    She has no cervical adenopathy.  Neurological: She is alert and oriented to person, place, and time.  Psychiatric: She has a normal mood and affect. Her behavior is normal. Judgment and thought content normal.          Assessment & Plan:

## 2013-05-02 NOTE — Patient Instructions (Addendum)
  I am treating you for a sinus infection and also for your recent hx of tick bite.  I suspect your symptoms are more related to the sinus problem however.  Start doxycycline 100 mg twice a day for 10 days.  Make sure her to drink plenty of fluids to stay hydrated.  Please have your labs drawn prior to leaving the office.  I will call you once the results are available.  Please let us know if your symptoms worsen or if they do not improve within 4-5 days.

## 2013-05-02 NOTE — Assessment & Plan Note (Addendum)
Patient's symptoms are consistent with sinusitis with yellow drainage and pressure within sinuses and ears. She is allergic to penicillin. I will treat her with doxycycline to cover the sinus infection as well as treat empirically for tickborne illness. Check CBC and metabolic panel.

## 2013-05-02 NOTE — Assessment & Plan Note (Addendum)
Patient reports take it approximately 2 weeks ago with symptoms reported in history of present illness developing approximately one week later. Patient without rash or fever. She also presents with sinus symptoms. I suspect her symptoms are more related to her sinus problem then to this tick bite. I will treat empirically for tick borne illness. Check CBC and metabolic panel.

## 2013-06-17 ENCOUNTER — Ambulatory Visit (INDEPENDENT_AMBULATORY_CARE_PROVIDER_SITE_OTHER): Payer: Medicare Other | Admitting: Adult Health

## 2013-06-17 ENCOUNTER — Encounter: Payer: Self-pay | Admitting: Adult Health

## 2013-06-17 VITALS — BP 124/62 | HR 87 | Temp 98.2°F | Resp 12 | Ht 64.0 in | Wt 152.5 lb

## 2013-06-17 DIAGNOSIS — H9209 Otalgia, unspecified ear: Secondary | ICD-10-CM

## 2013-06-17 DIAGNOSIS — H9202 Otalgia, left ear: Secondary | ICD-10-CM

## 2013-06-17 DIAGNOSIS — K219 Gastro-esophageal reflux disease without esophagitis: Secondary | ICD-10-CM | POA: Insufficient documentation

## 2013-06-17 MED ORDER — AZELASTINE-FLUTICASONE 137-50 MCG/ACT NA SUSP: 1.0000 | Freq: Two times a day (BID) | NASAL | Status: DC

## 2013-06-17 MED ORDER — AZITHROMYCIN 250 MG PO TABS: ORAL_TABLET | ORAL | Status: DC

## 2013-06-17 NOTE — Assessment & Plan Note (Addendum)
Having considerable amount of sinus drainage. No s/s of otitis. Will treat sinus since symptoms ongoing for greater than 10 days. Start Azithromycin. Provided with sample of Dymista.

## 2013-06-17 NOTE — Progress Notes (Signed)
  Subjective:    [name], female    DOB: 1941-09-19, 72 y.o.   MRN: [id number]  HPI  Patient present to clinic with c/o sinus drainage, irritated throat, clearing her throat constantly. She reports starting to cough mainly when she lies down. She reports having hx of GERD and has been taking omeprazole for years. Some hoarseness. She also has pain in her left ear. She has been taking claritin D and takes benadryl occasionally. She has taken tylenol for the pain in her ear which alleviated the symptoms. She is getting ready to leave for vacation and wanted to make sure she did not get sick on vacation.   Current Outpatient Prescriptions on File Prior to Visit  Medication Sig Dispense Refill  . aspirin EC 81 MG tablet Take 81 mg by mouth daily.      Marland Kitchen imipramine (TOFRANIL) 25 MG tablet Take 25 mg by mouth at bedtime.       . lovastatin (MEVACOR) 20 MG tablet Take 1 tablet (20 mg total) by mouth at bedtime.  90 tablet  3  . Multiple Vitamin (MULTI-VITAMIN DAILY PO) Take 1 tablet by mouth daily.      Marland Kitchen omeprazole (PRILOSEC) 20 MG capsule Take one per day  90 capsule  3   No current facility-administered medications on file prior to visit.    Review of Systems  HENT: Positive for ear pain, rhinorrhea, voice change and postnasal drip. Negative for congestion, sore throat and trouble swallowing.   Neurological: Negative.   Psychiatric/Behavioral: Negative.        Objective:   Physical Exam  Constitutional: She appears well-developed and well-nourished. No distress.  HENT:  Head: Normocephalic and atraumatic.  Right Ear: External ear normal.  Left Ear: External ear normal.  Nose: Nose normal.  Pharyngeal erythema  Cardiovascular: Normal rate and regular rhythm.   Pulmonary/Chest: Effort normal. No respiratory distress.  Psychiatric: She has a normal mood and affect. Her behavior is normal. Judgment and thought content normal.          Assessment & Plan:

## 2013-06-17 NOTE — Assessment & Plan Note (Signed)
Coughing when lying down. Also with hoarseness. She has been on omeprazole for years. Try Nexium for 10 days for medication holiday with omeprazole.

## 2013-08-15 ENCOUNTER — Encounter: Payer: Self-pay | Admitting: Internal Medicine

## 2013-08-15 ENCOUNTER — Ambulatory Visit (INDEPENDENT_AMBULATORY_CARE_PROVIDER_SITE_OTHER): Payer: Medicare Other | Admitting: Internal Medicine

## 2013-08-15 ENCOUNTER — Encounter (INDEPENDENT_AMBULATORY_CARE_PROVIDER_SITE_OTHER): Payer: Self-pay

## 2013-08-15 VITALS — BP 122/80 | HR 105 | Temp 98.1°F | Ht 64.0 in | Wt 151.5 lb

## 2013-08-15 DIAGNOSIS — K219 Gastro-esophageal reflux disease without esophagitis: Secondary | ICD-10-CM

## 2013-08-15 DIAGNOSIS — H9192 Unspecified hearing loss, left ear: Secondary | ICD-10-CM

## 2013-08-15 DIAGNOSIS — R251 Tremor, unspecified: Secondary | ICD-10-CM

## 2013-08-15 DIAGNOSIS — R5381 Other malaise: Secondary | ICD-10-CM

## 2013-08-15 DIAGNOSIS — R6884 Jaw pain: Secondary | ICD-10-CM

## 2013-08-15 DIAGNOSIS — N3010 Interstitial cystitis (chronic) without hematuria: Secondary | ICD-10-CM

## 2013-08-15 DIAGNOSIS — Z1211 Encounter for screening for malignant neoplasm of colon: Secondary | ICD-10-CM

## 2013-08-15 DIAGNOSIS — H9190 Unspecified hearing loss, unspecified ear: Secondary | ICD-10-CM

## 2013-08-15 DIAGNOSIS — R5383 Other fatigue: Secondary | ICD-10-CM

## 2013-08-15 DIAGNOSIS — R259 Unspecified abnormal involuntary movements: Secondary | ICD-10-CM

## 2013-08-15 DIAGNOSIS — E7800 Pure hypercholesterolemia, unspecified: Secondary | ICD-10-CM

## 2013-08-15 LAB — CBC WITH DIFFERENTIAL/PLATELET
Basophils Absolute: 0 10*3/uL (ref 0.0–0.1)
Eosinophils Absolute: 0 10*3/uL (ref 0.0–0.7)
Hemoglobin: 13.7 g/dL (ref 12.0–15.0)
Lymphocytes Relative: 25.1 % (ref 12.0–46.0)
Monocytes Relative: 10.3 % (ref 3.0–12.0)
Neutro Abs: 3.3 10*3/uL (ref 1.4–7.7)
Neutrophils Relative %: 63.2 % (ref 43.0–77.0)
RBC: 4.51 Mil/uL (ref 3.87–5.11)
RDW: 14 % (ref 11.5–14.6)

## 2013-08-15 LAB — LIPID PANEL
HDL: 78.6 mg/dL (ref >39.00)
Triglycerides: 50 mg/dL (ref 0.0–149.0)
VLDL: 10 mg/dL (ref 0.0–40.0)

## 2013-08-15 LAB — TSH: TSH: 2.08 u[IU]/mL (ref 0.35–5.50)

## 2013-08-15 LAB — HEPATIC FUNCTION PANEL
Albumin: 4.5 g/dL (ref 3.5–5.2)
Alkaline Phosphatase: 73 U/L (ref 39–117)
Total Protein: 7.4 g/dL (ref 6.0–8.3)

## 2013-08-15 LAB — BASIC METABOLIC PANEL
BUN: 11 mg/dL (ref 6–23)
CO2: 27 mEq/L (ref 19–32)
Calcium: 9.5 mg/dL (ref 8.4–10.5)
Creatinine, Ser: 0.9 mg/dL (ref 0.4–1.2)
GFR: 68.63 mL/min (ref >60.00)
Glucose, Bld: 109 mg/dL — ABNORMAL HIGH (ref 70–99)
Sodium: 138 mEq/L (ref 135–145)

## 2013-08-15 LAB — VITAMIN B12: Vitamin B-12: 384 pg/mL (ref 211–911)

## 2013-08-15 NOTE — Assessment & Plan Note (Addendum)
Stable.  Following with Dr.  Cope.    

## 2013-08-15 NOTE — Progress Notes (Signed)
Subjective:    [name], female    DOB: 1941/02/17, 72 y.o.   MRN: [id number]  HPI 72 year old female with past history of hypercholesterolemia and interstitial cystitis who comes in today to follow up on these issues as well as for a complete physical exam.   States overall she has been doing relatively well.  Increased stress with her husband's medical issues.  She does not feel she needs any further intervention at this point.  Eating and drinking well.  Bowels stable.  Feels things are stable from a urinary standpoint.  On imipramine.  Sees Dr Achilles Dunk.  Had seen Dr Lemar Livings for her breast.  He had recommended a 6 month f/u.  He saw her and recommended keeping her on a yearly schedule.  She does report having some increased pain left ear, throat and neck.  Does hurt to swallow.  Has been seen on two recent occasions by our nurse practitioner.  Given abx.  No relief.  Nasal spray did help some.  Does report some increase drainage.  No chest congestion or cough.  Increased pain when she opens and closes her mouth.     Past Medical History  Diagnosis Date  . Degenerative disk disease   . GERD (gastroesophageal reflux disease)   . Interstitial cystitis     followed by Dr Achilles Dunk  . Cancer 1992    skin  . Hypercholesterolemia 2008  . Personal history of tobacco use, presenting hazards to health   . Breast screening, unspecified 2013  . Special screening for malignant neoplasms, colon   . Other sign and symptom in breast 2013    Left upper outer quadrant breast "soreness" Ultrasound exam of right breast in the 2 o'clock position with the breast distracted medially showed a 0.3-0.4 with 0.5 cm simple cyst. In the 1 o'clock position where pt reported tenderness US exam was negatiive.  Because of her history of intermittent nipple drainage, ultrasound was completed of the retroareolar area.    Outpatient Encounter Prescriptions as of 08/15/2013  Medication Sig  . aspirin EC 81 MG  tablet Take 81 mg by mouth daily.  Marland Kitchen imipramine (TOFRANIL) 25 MG tablet Take 25 mg by mouth at bedtime.   . lovastatin (MEVACOR) 20 MG tablet Take 1 tablet (20 mg total) by mouth at bedtime.  . Multiple Vitamin (MULTI-VITAMIN DAILY PO) Take 1 tablet by mouth daily.  Marland Kitchen omeprazole (PRILOSEC) 20 MG capsule Take one per day  . [DISCONTINUED] Azelastine-Fluticasone 137-50 MCG/ACT SUSP Place 1 spray into the nose 2 (two) times daily.  . [DISCONTINUED] azithromycin (ZITHROMAX) 250 MG tablet Take 2 tablets today then take 1 tablet daily for the next 4 days.    Review of Systems Patient denies any headache, lightheadedness or dizziness.  No significant sinus or allergy symptoms.  Does report some increased drainage.  Increased pain - angle of the jaw.  Some decreased hearing.  No chest pain, tightness or palpitations.  No increased shortness of breath, cough or congestion.  No nausea or vomiting.  No abdominal pain or cramping.  No bowel change, such as diarrhea, constipation, BRBPR or melana.  No urine change.  States her bladder is stable.  Continues to follow with Dr Achilles Dunk.  Increased stress with her husband's medical issues.        Objective:   Physical Exam  Filed Vitals:   08/15/13 1038  BP: 122/80  Pulse: 105  Temp: 98.1 F (36.7 C)   72  year old female in no acute distress.   HEENT:  Nares- clear.  Oropharynx - without lesions.  Increased pain - angle of the jaw.  Increased pain with opening and closing her mouth.  TMs - clear, no erythema.   NECK:  Supple.  Nontender.  No audible bruit.  HEART:  Appears to be regular. LUNGS:  No crackles or wheezing audible.  Respirations even and unlabored.  RADIAL PULSE:  Equal bilaterally.    BREASTS:  No nipple discharge or nipple retraction present.  Could not appreciate any distinct nodules or axillary adenopathy.  ABDOMEN:  Soft, nontender.  Bowel sounds present and normal.  No audible abdominal bruit.  GU:  Not performed.     EXTREMITIES:  No  increased edema present.  DP pulses palpable and equal bilaterally.          Assessment & Plan:  BREAST EXAM.  Saw Dr Lemar Livings.  Recommended keeping her on a yearly schedule.     INCREASED PSYCHOSOCIAL STRESSORS.  She feels she is handling things relatively well.  Does not feel she needs anything more at this point.  Follow.  Will let me know if she desires or needs any further intervention.    HEALTH MAINTENANCE.  Physical today.  Is s/p hysterectomy and does not require yearly pap smears.  Overdue colonoscopy.  Was referred to GI last visit for colon screening.  She will notify me when agreeable.  She has declined bone density.  Mammogram as outlined above.  Saw Dr Lemar Livings.  Recommended keeping her on a yearly schedule.

## 2013-08-15 NOTE — Progress Notes (Signed)
Pre-visit discussion using our clinic review tool. No additional management support is needed unless otherwise documented below in the visit note.  

## 2013-08-15 NOTE — Assessment & Plan Note (Addendum)
Low cholesterol diet and exercise.  Continue lovastatin.  Follow lipid panel and liver function.   

## 2013-08-16 ENCOUNTER — Encounter: Payer: Self-pay | Admitting: *Deleted

## 2013-08-16 ENCOUNTER — Encounter: Payer: Self-pay | Admitting: Internal Medicine

## 2013-08-16 ENCOUNTER — Other Ambulatory Visit: Payer: Self-pay | Admitting: Internal Medicine

## 2013-08-16 DIAGNOSIS — H9190 Unspecified hearing loss, unspecified ear: Secondary | ICD-10-CM | POA: Insufficient documentation

## 2013-08-16 DIAGNOSIS — R739 Hyperglycemia, unspecified: Secondary | ICD-10-CM

## 2013-08-16 DIAGNOSIS — R6884 Jaw pain: Secondary | ICD-10-CM | POA: Insufficient documentation

## 2013-08-16 NOTE — Assessment & Plan Note (Signed)
Taking her omeprazole.  No reported problems of acid reflux.  Follow.  Refer to ENT as outlined.

## 2013-08-16 NOTE — Progress Notes (Signed)
Order placed for f/u labs.  

## 2013-08-16 NOTE — Assessment & Plan Note (Signed)
Persistent.  Exam as outlined.  Concern regarding possible TMJ.  Does have some congestion.  Will restart her steroid nasal spray.  Flush with saline as directed.  Does have some pain with swallowing.  Unclear etiology.  Acid reflux controlled.  No evidence of infection.  Will refer to ENT for evaluation.  Has also noticed a change in her hearing.  Will have ENT evaluate.

## 2013-08-16 NOTE — Assessment & Plan Note (Signed)
Has noticed a change in her hearing.  I noticed some change today.  Will refer to ENT for evaluation.

## 2013-08-30 ENCOUNTER — Ambulatory Visit: Payer: Self-pay | Admitting: Internal Medicine

## 2013-08-30 ENCOUNTER — Encounter: Payer: Self-pay | Admitting: Internal Medicine

## 2013-09-12 ENCOUNTER — Encounter: Payer: Self-pay | Admitting: *Deleted

## 2013-09-12 ENCOUNTER — Other Ambulatory Visit (INDEPENDENT_AMBULATORY_CARE_PROVIDER_SITE_OTHER): Payer: Medicare Other

## 2013-09-12 DIAGNOSIS — R739 Hyperglycemia, unspecified: Secondary | ICD-10-CM

## 2013-09-12 DIAGNOSIS — R7309 Other abnormal glucose: Secondary | ICD-10-CM

## 2013-09-12 LAB — HEMOGLOBIN A1C: Hgb A1c MFr Bld: 5.7 % (ref 4.6–6.5)

## 2013-09-13 LAB — GLUCOSE, FASTING: Glucose, Fasting: 80 mg/dL (ref 70–99)

## 2013-09-19 ENCOUNTER — Other Ambulatory Visit (INDEPENDENT_AMBULATORY_CARE_PROVIDER_SITE_OTHER): Payer: Medicare Other

## 2013-09-19 DIAGNOSIS — Z1211 Encounter for screening for malignant neoplasm of colon: Secondary | ICD-10-CM

## 2013-09-20 ENCOUNTER — Encounter: Payer: Self-pay | Admitting: *Deleted

## 2013-10-24 ENCOUNTER — Ambulatory Visit (INDEPENDENT_AMBULATORY_CARE_PROVIDER_SITE_OTHER): Payer: Medicare Other | Admitting: Internal Medicine

## 2013-10-24 ENCOUNTER — Encounter: Payer: Self-pay | Admitting: Internal Medicine

## 2013-10-24 VITALS — BP 120/80 | HR 81 | Temp 98.1°F | Ht 64.0 in | Wt 156.2 lb

## 2013-10-24 DIAGNOSIS — K219 Gastro-esophageal reflux disease without esophagitis: Secondary | ICD-10-CM

## 2013-10-24 DIAGNOSIS — R259 Unspecified abnormal involuntary movements: Secondary | ICD-10-CM

## 2013-10-24 DIAGNOSIS — N3010 Interstitial cystitis (chronic) without hematuria: Secondary | ICD-10-CM

## 2013-10-24 DIAGNOSIS — E7800 Pure hypercholesterolemia, unspecified: Secondary | ICD-10-CM

## 2013-10-24 DIAGNOSIS — R6884 Jaw pain: Secondary | ICD-10-CM

## 2013-10-24 DIAGNOSIS — R251 Tremor, unspecified: Secondary | ICD-10-CM

## 2013-10-24 NOTE — Progress Notes (Signed)
Pre-visit discussion using our clinic review tool. No additional management support is needed unless otherwise documented below in the visit note.  

## 2013-10-25 ENCOUNTER — Encounter: Payer: Self-pay | Admitting: Internal Medicine

## 2013-10-25 NOTE — Assessment & Plan Note (Signed)
Stable.  Following with Dr.  Jacqlyn Larsen.

## 2013-10-25 NOTE — Assessment & Plan Note (Signed)
Low cholesterol diet and exercise.  Continue lovastatin.  Follow lipid panel and liver function.   

## 2013-10-25 NOTE — Assessment & Plan Note (Signed)
Resolved

## 2013-10-25 NOTE — Assessment & Plan Note (Signed)
Taking her omeprazole.  No reported problems of acid reflux.  Follow.

## 2013-10-25 NOTE — Assessment & Plan Note (Signed)
Persistent worsening tremor.  Exam as outlined.  Refer to neurology for further neuro evaluation and testing.

## 2013-10-25 NOTE — Progress Notes (Signed)
Subjective:    [name], female    DOB: 04-21-1941, 73 y.o.   MRN: [id number]  HPI 73 year old female with past history of hypercholesterolemia and interstitial cystitis who comes in today for a scheduled follow up.  States overall she has been doing relatively well.  Increased stress with her husband's medical issues.  He is doing some better.  She does not feel she needs any further intervention at this point.  Eating and drinking well.  Bowels stable.  Feels things are stable from a urinary standpoint.  On imipramine.  Sees Dr Jacqlyn Larsen.  Had seen Dr Bary Castilla for her breast.  He had recommended a 6 month f/u.  He saw her and recommended keeping her on a yearly schedule.  She reported previously having some increased pain left ear, throat and neck.  See last note for details.  Saw ENT.  Instructed to take motrin.  No problems now.  She has started back walking.  She does report increasing tremors.  More noticeable to family and friends.  Affects her with carrying plates, etc.     Past Medical History  Diagnosis Date  . Degenerative disk disease   . GERD (gastroesophageal reflux disease)   . Interstitial cystitis     followed by Dr Jacqlyn Larsen  . Cancer 1992    skin  . Hypercholesterolemia 2008  . Personal history of tobacco use, presenting hazards to health   . Breast screening, unspecified 2013  . Special screening for malignant neoplasms, colon   . Other sign and symptom in breast 2013    Left upper outer quadrant breast "soreness" Ultrasound exam of right breast in the 2 o'clock position with the breast distracted medially showed a 0.3-0.4 with 0.5 cm simple cyst. In the 1 o'clock position where pt reported tenderness US exam was negatiive.  Because of her history of intermittent nipple drainage, ultrasound was completed of the retroareolar area.    Outpatient Encounter Prescriptions as of 10/24/2013  Medication Sig  . acetaminophen (TYLENOL) 325 MG tablet Take 650 mg by mouth  as needed.  Marland Kitchen aspirin EC 81 MG tablet Take 81 mg by mouth daily.  Marland Kitchen imipramine (TOFRANIL) 25 MG tablet Take 25 mg by mouth at bedtime.   . lovastatin (MEVACOR) 20 MG tablet Take 1 tablet (20 mg total) by mouth at bedtime.  . Multiple Vitamin (MULTI-VITAMIN DAILY PO) Take 1 tablet by mouth daily.  Marland Kitchen omeprazole (PRILOSEC) 20 MG capsule Take one per day    Review of Systems Patient denies any headache, lightheadedness or dizziness.  No significant sinus or allergy symptoms.  No significant jaw pain now.   No chest pain, tightness or palpitations.  No increased shortness of breath, cough or congestion.  No nausea or vomiting.  No abdominal pain or cramping.  No bowel change, such as diarrhea, constipation, BRBPR or melana.  No urine change.  States her bladder is stable.  Continues to follow with Dr Jacqlyn Larsen.  Increased stress with her husband's medical issues.  He is dong better.  Feels she is doing relatively well.  Increased tremor as outlined.         Objective:   Physical Exam  Filed Vitals:   10/24/13 1058  BP: 120/80  Pulse: 81  Temp: 98.1 F (36.7 C)   Blood pressure recheck:  5/62  73 year old female in no acute distress.   HEENT:  Nares- clear.  Oropharynx - without lesions.   NECK:  Supple.  Nontender.  No audible bruit.  HEART:  Appears to be regular. LUNGS:  No crackles or wheezing audible.  Respirations even and unlabored.  RADIAL PULSE:  Equal bilaterally.  ABDOMEN:  Soft, nontender.  Bowel sounds present and normal.  No audible abdominal bruit.     EXTREMITIES:  No increased edema present.  DP pulses palpable and equal bilaterally.  NERUO:  Increased tremor with outstretched hands.  No cog wheeling on exam.   Gait appears normal.  No shuffling of gait.          Assessment & Plan:  BREAST EXAM.  Saw Dr Bary Castilla.  Recommended keeping her on a yearly schedule.   Mammogram 08/30/13 - [facility] I.   INCREASED PSYCHOSOCIAL STRESSORS.  She feels she is handling things relatively  well.  Does not feel she needs anything more at this point.  Follow.  Will let me know if she desires or needs any further intervention.    HEALTH MAINTENANCE.  Physical 08/15/13.   Is s/p hysterectomy and does not require yearly pap smears.  Overdue colonoscopy.  Was referred to GI for colon screening.  She will notify me when agreeable.  She has declined bone density.  Mammogram as outlined above.  Saw Dr Bary Castilla.  Recommended keeping her on a yearly schedule.  Mammogram 08/30/13 - [facility] I.

## 2013-11-10 ENCOUNTER — Other Ambulatory Visit: Payer: Self-pay | Admitting: Internal Medicine

## 2013-12-14 ENCOUNTER — Other Ambulatory Visit: Payer: Self-pay | Admitting: Internal Medicine

## 2014-02-20 ENCOUNTER — Other Ambulatory Visit: Payer: Self-pay | Admitting: Internal Medicine

## 2014-02-21 ENCOUNTER — Ambulatory Visit (INDEPENDENT_AMBULATORY_CARE_PROVIDER_SITE_OTHER): Payer: Medicare Other | Admitting: Internal Medicine

## 2014-02-21 ENCOUNTER — Encounter: Payer: Self-pay | Admitting: Internal Medicine

## 2014-02-21 VITALS — BP 130/70 | HR 86 | Temp 98.3°F | Ht 64.0 in | Wt 155.2 lb

## 2014-02-21 DIAGNOSIS — K219 Gastro-esophageal reflux disease without esophagitis: Secondary | ICD-10-CM

## 2014-02-21 DIAGNOSIS — E7800 Pure hypercholesterolemia, unspecified: Secondary | ICD-10-CM

## 2014-02-21 DIAGNOSIS — R6884 Jaw pain: Secondary | ICD-10-CM

## 2014-02-21 DIAGNOSIS — R109 Unspecified abdominal pain: Secondary | ICD-10-CM

## 2014-02-21 DIAGNOSIS — N3010 Interstitial cystitis (chronic) without hematuria: Secondary | ICD-10-CM

## 2014-02-21 LAB — CBC WITH DIFFERENTIAL/PLATELET
BASOS ABS: 0 10*3/uL (ref 0.0–0.1)
Basophils Relative: 0.7 % (ref 0.0–3.0)
EOS PCT: 2.1 % (ref 0.0–5.0)
Eosinophils Absolute: 0.1 10*3/uL (ref 0.0–0.7)
HEMATOCRIT: 39.3 % (ref 36.0–46.0)
Hemoglobin: 13 g/dL (ref 12.0–15.0)
LYMPHS ABS: 1.5 10*3/uL (ref 0.7–4.0)
Lymphocytes Relative: 24.2 % (ref 12.0–46.0)
MCHC: 33.2 g/dL (ref 30.0–36.0)
MCV: 91.5 fl (ref 78.0–100.0)
Monocytes Absolute: 0.6 10*3/uL (ref 0.1–1.0)
Monocytes Relative: 9.4 % (ref 3.0–12.0)
Neutro Abs: 3.9 10*3/uL (ref 1.4–7.7)
Neutrophils Relative %: 63.6 % (ref 43.0–77.0)
PLATELETS: 230 10*3/uL (ref 150.0–400.0)
RBC: 4.29 Mil/uL (ref 3.87–5.11)
RDW: 13.8 % (ref 11.5–15.5)
WBC: 6.1 10*3/uL (ref 4.0–10.5)

## 2014-02-21 LAB — COMPREHENSIVE METABOLIC PANEL
ALBUMIN: 4 g/dL (ref 3.5–5.2)
ALT: 12 U/L (ref 0–35)
AST: 18 U/L (ref 0–37)
Alkaline Phosphatase: 71 U/L (ref 39–117)
BUN: 8 mg/dL (ref 6–23)
CALCIUM: 9.3 mg/dL (ref 8.4–10.5)
CHLORIDE: 104 meq/L (ref 96–112)
CO2: 25 mEq/L (ref 19–32)
Creatinine, Ser: 0.8 mg/dL (ref 0.4–1.2)
GFR: 70.42 mL/min (ref >60.00)
GLUCOSE: 102 mg/dL — AB (ref 70–99)
POTASSIUM: 4.6 meq/L (ref 3.5–5.1)
Sodium: 140 mEq/L (ref 135–145)
Total Bilirubin: 0.6 mg/dL (ref 0.2–1.2)
Total Protein: 6.5 g/dL (ref 6.0–8.3)

## 2014-02-21 LAB — LIPID PANEL
CHOLESTEROL: 181 mg/dL (ref 0–200)
HDL: 71.5 mg/dL (ref >39.00)
LDL Cholesterol: 102 mg/dL — ABNORMAL HIGH (ref 0–99)
TRIGLYCERIDES: 37 mg/dL (ref 0.0–149.0)
Total CHOL/HDL Ratio: 3
VLDL: 7.4 mg/dL (ref 0.0–40.0)

## 2014-02-21 LAB — LIPASE: Lipase: 9 U/L — ABNORMAL LOW (ref 11.0–59.0)

## 2014-02-21 LAB — AMYLASE: Amylase: 27 U/L (ref 27–131)

## 2014-02-21 MED ORDER — OMEPRAZOLE 20 MG PO CPDR: 20.0000 mg | DELAYED_RELEASE_CAPSULE | Freq: Two times a day (BID) | ORAL | Status: DC

## 2014-02-21 NOTE — Progress Notes (Signed)
Subjective:    [name], female    DOB: 25-Aug-1941, 73 y.o.   MRN: [id number]  HPI 73 year old female with past history of hypercholesterolemia and interstitial cystitis who comes in today for a scheduled follow up.  States overall she has been doing relatively well.  Increased stress with her husband's medical issues.  She does not feel she needs any further intervention at this point.  Eating and drinking well.  She does report if she eats something greasy, then she will notice some abdominal discomfort.  (notices with pizza and also notices with salad).  No notice of acid reflux.  Bowels stable.  Takes fiber daily.  Makes her regular.  Still having some issues with her bladder.  On imipramine.  Sees Dr Jacqlyn Larsen.  Just recently increased imipramine.  Plans to discuss with Dr Jacqlyn Larsen.  Had seen Dr Bary Castilla for her breast.  He had recommended a 6 month f/u.  He then saw her and recommended keeping her on a yearly schedule.  Had mammogram 08/30/13 - [facility] I.      Past Medical History  Diagnosis Date  . Degenerative disk disease   . GERD (gastroesophageal reflux disease)   . Interstitial cystitis     followed by Dr Jacqlyn Larsen  . Cancer 1992    skin  . Hypercholesterolemia 2008  . Personal history of tobacco use, presenting hazards to health   . Breast screening, unspecified 2013  . Special screening for malignant neoplasms, colon   . Other sign and symptom in breast 2013    Left upper outer quadrant breast "soreness" Ultrasound exam of right breast in the 2 o'clock position with the breast distracted medially showed a 0.3-0.4 with 0.5 cm simple cyst. In the 1 o'clock position where pt reported tenderness US exam was negatiive.  Because of her history of intermittent nipple drainage, ultrasound was completed of the retroareolar area.    Outpatient Encounter Prescriptions as of 02/21/2014  Medication Sig  . acetaminophen (TYLENOL) 325 MG tablet Take 650 mg by mouth as needed.  Marland Kitchen aspirin  EC 81 MG tablet Take 81 mg by mouth daily.  Marland Kitchen imipramine (TOFRANIL) 25 MG tablet Take 25 mg by mouth at bedtime.   . lovastatin (MEVACOR) 20 MG tablet TAKE ONE TABLET DAILY AT BEDTIME  . Multiple Vitamin (MULTI-VITAMIN DAILY PO) Take 1 tablet by mouth daily.  Marland Kitchen omeprazole (PRILOSEC) 20 MG capsule Take one per day  . [DISCONTINUED] omeprazole (PRILOSEC) 20 MG capsule TAKE ONE (1) CAPSULE EACH DAY    Review of Systems Patient denies any headache, lightheadedness or dizziness.  No significant sinus or allergy symptoms.  No chest pain, tightness or palpitations.  No increased shortness of breath, cough or congestion.  No nausea or vomiting.  Does report some abdominal discomfort if she eats something greasy.  No bowel change, such as diarrhea, constipation, BRBPR or melana. Fiber keeps her regular.   No significant urine change.  Just increased her imipramine.  Continues to follow with Dr Jacqlyn Larsen.  Increased stress with her husband's medical issues.  Feels she is doing relatively well.           Objective:   Physical Exam  Filed Vitals:   02/21/14 0924  BP: 130/70  Pulse: 86  Temp: 98.3 F (36.8 C)   Blood pressure recheck:  34/67  73 year old female in no acute distress.   HEENT:  Nares- clear.  Oropharynx - without lesions.   NECK:  Supple.  Nontender.  No audible bruit.  HEART:  Appears to be regular. LUNGS:  No crackles or wheezing audible.  Respirations even and unlabored.  RADIAL PULSE:  Equal bilaterally.  ABDOMEN:  Soft, nontender.  Bowel sounds present and normal.  No audible abdominal bruit.     EXTREMITIES:  No increased edema present.  DP pulses palpable and equal bilaterally.          Assessment & Plan:  BREAST EXAM.  Saw Dr Bary Castilla.  Recommended keeping her on a yearly schedule.   Mammogram 08/30/13 - [facility] I.   INCREASED PSYCHOSOCIAL STRESSORS.  She feels she is handling things relatively well.  Does not feel she needs anything more at this point.  Follow.  Will let me  know if she desires or needs any further intervention.    HEALTH MAINTENANCE.  Physical 08/15/13.   Is s/p hysterectomy and does not require yearly pap smears.  Overdue colonoscopy.  Was referred to GI for colon screening.  She will notify me when agreeable.  She has declined bone density.  Mammogram as outlined above.  Saw Dr Bary Castilla.  Recommended keeping her on a yearly schedule.  Mammogram 08/30/13 - [facility] I.    I spent 25 minutes with the patient and more than 50% of the time was spent in consultation regarding the above.

## 2014-02-21 NOTE — Progress Notes (Signed)
Pre visit review using our clinic review tool, if applicable. No additional management support is needed unless otherwise documented below in the visit note. 

## 2014-02-22 ENCOUNTER — Other Ambulatory Visit: Payer: Self-pay | Admitting: *Deleted

## 2014-02-22 MED ORDER — OMEPRAZOLE 20 MG PO CPDR: 20.0000 mg | DELAYED_RELEASE_CAPSULE | Freq: Two times a day (BID) | ORAL | Status: DC

## 2014-02-24 ENCOUNTER — Encounter: Payer: Self-pay | Admitting: *Deleted

## 2014-02-25 ENCOUNTER — Encounter: Payer: Self-pay | Admitting: Internal Medicine

## 2014-02-25 NOTE — Assessment & Plan Note (Signed)
Taking her omeprazole.  Does report the increased abdominal discomfort if she eats salads or something greasy.  Will increase omeprazole to bid.  Get her back in soon to reassess.  Follow closely.

## 2014-02-25 NOTE — Assessment & Plan Note (Signed)
Low cholesterol diet and exercise.  Continue lovastatin.  Follow lipid panel and liver function.   

## 2014-02-25 NOTE — Assessment & Plan Note (Signed)
Symptoms and exam as outlined.  Discussed further w/up including scanning, etc.  She declines further w/up.  Increase omeprazole to bid.  Get her back in soon to reassess.  Follow closely.

## 2014-02-25 NOTE — Assessment & Plan Note (Signed)
Resolved

## 2014-02-25 NOTE — Assessment & Plan Note (Signed)
Just recently increased imipramine.  Following with Dr.  Jacqlyn Larsen.

## 2014-03-17 ENCOUNTER — Other Ambulatory Visit: Payer: Self-pay | Admitting: Internal Medicine

## 2014-05-08 ENCOUNTER — Encounter: Payer: Self-pay | Admitting: Internal Medicine

## 2014-05-08 ENCOUNTER — Ambulatory Visit (INDEPENDENT_AMBULATORY_CARE_PROVIDER_SITE_OTHER): Payer: Medicare Other | Admitting: Internal Medicine

## 2014-05-08 VITALS — BP 130/80 | HR 83 | Temp 98.1°F | Ht 64.0 in | Wt 157.8 lb

## 2014-05-08 DIAGNOSIS — F439 Reaction to severe stress, unspecified: Secondary | ICD-10-CM

## 2014-05-08 DIAGNOSIS — R259 Unspecified abnormal involuntary movements: Secondary | ICD-10-CM

## 2014-05-08 DIAGNOSIS — K219 Gastro-esophageal reflux disease without esophagitis: Secondary | ICD-10-CM

## 2014-05-08 DIAGNOSIS — R109 Unspecified abdominal pain: Secondary | ICD-10-CM

## 2014-05-08 DIAGNOSIS — Z733 Stress, not elsewhere classified: Secondary | ICD-10-CM

## 2014-05-08 DIAGNOSIS — N3010 Interstitial cystitis (chronic) without hematuria: Secondary | ICD-10-CM

## 2014-05-08 DIAGNOSIS — E7800 Pure hypercholesterolemia, unspecified: Secondary | ICD-10-CM

## 2014-05-08 DIAGNOSIS — R251 Tremor, unspecified: Secondary | ICD-10-CM

## 2014-05-08 MED ORDER — SERTRALINE HCL 50 MG PO TABS: 50.0000 mg | ORAL_TABLET | Freq: Every day | ORAL | Status: DC

## 2014-05-08 NOTE — Patient Instructions (Signed)
Take zoloft 50mg  1/2 tablet per day for one week and then one whole tablet per day

## 2014-05-08 NOTE — Progress Notes (Signed)
Pre visit review using our clinic review tool, if applicable. No additional management support is needed unless otherwise documented below in the visit note. 

## 2014-05-09 ENCOUNTER — Encounter: Payer: Self-pay | Admitting: Internal Medicine

## 2014-05-09 DIAGNOSIS — F439 Reaction to severe stress, unspecified: Secondary | ICD-10-CM | POA: Insufficient documentation

## 2014-05-09 NOTE — Progress Notes (Signed)
Subjective:    [name], female    DOB: 12-24-1940, 73 y.o.   MRN: [id number]  HPI 73 year old female with past history of hypercholesterolemia and interstitial cystitis who comes in today for a scheduled follow up.  States overall she has been doing relatively well.  Increased stress with her husband's medical issues.  This appears to have worsened.  We discussed further intervention.  She is talking to her pastor.  Feels she needs something to help level things out.  We discussed medication.  She is in agreement.   Eating and drinking well.   No notice of acid reflux.  Bowels stable.  Takes fiber daily.  Makes her regular.  Still having some issues with her bladder.  On imipramine.  Has been seeing Dr Jacqlyn Larsen.  On imipramine.  Will need follow up with urology.   Had seen Dr Bary Castilla for her breast.  He had recommended a 6 month f/u.  He then saw her and recommended keeping her on a yearly schedule.  Had mammogram 08/30/13 - [facility] I.  She is concerned about a tremor.  States notices more in her left hand and notices more when she is carrying something.  No pain in the arm.  Occasionally will notice a little tingling.  No weakness.  No trouble eating.  She is right handed.       Past Medical History  Diagnosis Date  . Degenerative disk disease   . GERD (gastroesophageal reflux disease)   . Interstitial cystitis     followed by Dr Jacqlyn Larsen  . Cancer 1992    skin  . Hypercholesterolemia 2008  . Personal history of tobacco use, presenting hazards to health   . Breast screening, unspecified 2013  . Special screening for malignant neoplasms, colon   . Other sign and symptom in breast 2013    Left upper outer quadrant breast "soreness" Ultrasound exam of right breast in the 2 o'clock position with the breast distracted medially showed a 0.3-0.4 with 0.5 cm simple cyst. In the 1 o'clock position where pt reported tenderness US exam was negatiive.  Because of her history of intermittent  nipple drainage, ultrasound was completed of the retroareolar area.    Outpatient Encounter Prescriptions as of 05/08/2014  Medication Sig  . acetaminophen (TYLENOL) 325 MG tablet Take 650 mg by mouth as needed.  Marland Kitchen aspirin EC 81 MG tablet Take 81 mg by mouth daily.  Marland Kitchen imipramine (TOFRANIL) 25 MG tablet Take 50 mg by mouth at bedtime.   . lovastatin (MEVACOR) 20 MG tablet TAKE ONE TABLET BY MOUTH EVERY NIGHT AT BEDTIME  . Multiple Vitamin (MULTI-VITAMIN DAILY PO) Take 1 tablet by mouth daily.  Marland Kitchen omeprazole (PRILOSEC) 20 MG capsule Take 1 capsule (20 mg total) by mouth 2 (two) times daily before a meal.  . sertraline (ZOLOFT) 50 MG tablet Take 1 tablet (50 mg total) by mouth daily.    Review of Systems Patient denies any headache, lightheadedness or dizziness.  No significant sinus or allergy symptoms.  No chest pain, tightness or palpitations.  No increased shortness of breath, cough or congestion.  No nausea or vomiting.  No abdominal pain reported.   No bowel change, such as diarrhea, constipation, BRBPR or melana.  Fiber keeps her regular.   No significant urine change.  On imipramine.  Has been seeing Dr Jacqlyn Larsen.  Increased stress with her husband's medical issues.  Feels she needs something more to help control.  Talks to  her pastor.  Does not feel needs more counseling.   Tremor as outlined.          Objective:   Physical Exam  Filed Vitals:   05/08/14 1048  BP: 130/80  Pulse: 83  Temp: 98.1 F (2.81 C)   73 year old female in no acute distress.   HEENT:  Nares- clear.  Oropharynx - without lesions.   NECK:  Supple.  Nontender.  No audible bruit.  HEART:  Appears to be regular. LUNGS:  No crackles or wheezing audible.  Respirations even and unlabored.  RADIAL PULSE:  Equal bilaterally.  ABDOMEN:  Soft, nontender.  Bowel sounds present and normal.  No audible abdominal bruit.     EXTREMITIES:  No increased edema present.  DP pulses palpable and equal bilaterally.   NEURO:  Finger  to nose intact.  No past pointing or increased tremor noted.  Grip strength normal and equal bilaterally.  Able to stand and walk without difficulty.  No significant gait abnormality noted.          Assessment & Plan:  BREAST EXAM.  Saw Dr Bary Castilla.  Recommended keeping her on a yearly schedule.   Mammogram 08/30/13 - [facility] I.   HEALTH MAINTENANCE.  Physical 08/15/13.   Is s/p hysterectomy and does not require yearly pap smears.  Overdue colonoscopy.  Was referred to GI for colon screening.  She will notify me when agreeable.  IFOB 09/19/13 - negative.  She has declined bone density. Mammogram as outlined above.  Saw Dr Bary Castilla.  Recommended keeping her on a yearly schedule.  Mammogram 08/30/13 - [facility] I.    I spent 25 minutes with the patient and more than 50% of the time was spent in consultation regarding the above.

## 2014-05-09 NOTE — Assessment & Plan Note (Signed)
Persistent worsening tremor.  Exam as outlined.  Was referred to neurology for further neuro evaluation and testing.  Apparently did not keep appt.  Discussed again today.  She wants to hold on referral.  Wants to treat her increased stress.  She feels this is contributing.

## 2014-05-09 NOTE — Assessment & Plan Note (Signed)
Not an issue for her today.  Follow.

## 2014-05-09 NOTE — Assessment & Plan Note (Signed)
On imipramine.  Has been ollowing with Dr.  Jacqlyn Larsen.

## 2014-05-09 NOTE — Assessment & Plan Note (Signed)
Taking her omeprazole.  Currently stable.

## 2014-05-09 NOTE — Assessment & Plan Note (Signed)
Increased stress as outlined.  Talks with her pastor.  Does not feel she need further counseling.  After discussion, will start zoloft 1/2 tablet x 1 week and then increased to one tablet per day.  Follow.

## 2014-05-09 NOTE — Assessment & Plan Note (Signed)
Low cholesterol diet and exercise.  Continue lovastatin.  Follow lipid panel and liver function.

## 2014-05-11 ENCOUNTER — Other Ambulatory Visit: Payer: Self-pay | Admitting: *Deleted

## 2014-05-11 MED ORDER — SERTRALINE HCL 50 MG PO TABS: 50.0000 mg | ORAL_TABLET | Freq: Every day | ORAL | Status: DC

## 2014-06-14 ENCOUNTER — Telehealth: Payer: Self-pay | Admitting: Internal Medicine

## 2014-06-14 NOTE — Telephone Encounter (Signed)
If the injury is localized to her foot, then I would recommend a referral to podiatry.  I can refer to a podiatrist of her choice.  If she does not have a preference, would she be agreeable to see Dr Milinda Pointer.  If so, let me know and I will place the order for the referral.

## 2014-06-14 NOTE — Telephone Encounter (Signed)
Need more information.  Did she break any bones?  What area of her body is injured?  Is she back in town?  Is she looking for a referral here in [place]?

## 2014-06-14 NOTE — Telephone Encounter (Signed)
Spoke with pt she states she fell yesterday while at [facility], was seen at [facility].  Please advise referral

## 2014-06-14 NOTE — Telephone Encounter (Signed)
Fell at [employer] wants to recommended  to a bone specialist.

## 2014-06-14 NOTE — Telephone Encounter (Signed)
Spoke with pt, she states it was her right foot.  States Xray was unclear as degenerative changes were found.  She did return to [place] and does want to be referred here.

## 2014-06-15 ENCOUNTER — Other Ambulatory Visit: Payer: Self-pay | Admitting: Internal Medicine

## 2014-06-15 DIAGNOSIS — M79673 Pain in unspecified foot: Secondary | ICD-10-CM

## 2014-06-15 NOTE — Telephone Encounter (Signed)
Spoke with pt, she states Dr Milinda Pointer is fine.  Please refer.

## 2014-06-15 NOTE — Progress Notes (Signed)
Order placed for podiatry referral.   

## 2014-06-15 NOTE — Telephone Encounter (Signed)
Order placed for referral.  

## 2014-07-06 ENCOUNTER — Encounter: Payer: Self-pay | Admitting: Internal Medicine

## 2014-07-06 ENCOUNTER — Ambulatory Visit (INDEPENDENT_AMBULATORY_CARE_PROVIDER_SITE_OTHER): Payer: Medicare Other | Admitting: Internal Medicine

## 2014-07-06 VITALS — BP 124/70 | HR 92 | Temp 98.4°F | Ht 64.0 in | Wt 154.0 lb

## 2014-07-06 DIAGNOSIS — K219 Gastro-esophageal reflux disease without esophagitis: Secondary | ICD-10-CM

## 2014-07-06 DIAGNOSIS — Z658 Other specified problems related to psychosocial circumstances: Secondary | ICD-10-CM

## 2014-07-06 DIAGNOSIS — F439 Reaction to severe stress, unspecified: Secondary | ICD-10-CM

## 2014-07-06 DIAGNOSIS — S96911D Strain of unspecified muscle and tendon at ankle and foot level, right foot, subsequent encounter: Secondary | ICD-10-CM

## 2014-07-06 DIAGNOSIS — E7800 Pure hypercholesterolemia, unspecified: Secondary | ICD-10-CM

## 2014-07-06 DIAGNOSIS — R739 Hyperglycemia, unspecified: Secondary | ICD-10-CM

## 2014-07-06 DIAGNOSIS — N3010 Interstitial cystitis (chronic) without hematuria: Secondary | ICD-10-CM

## 2014-07-06 NOTE — Progress Notes (Signed)
Subjective:    [name], female    DOB: [birth year], 74 y.o.   MRN: [id number]  HPI 73 year old female with past history of hypercholesterolemia and interstitial cystitis who comes in today for a scheduled follow up.   States overall she has been doing relatively well.  Increased stress with her husband's medical issues.  We discussed this last visit and she was started on zoloft. She did not feel this helped, but only took it for a short time.  She stopped the zoloft when she started on pain medication for her ankle.  [place] 9[place]15[place]  [place]  [place]aw Dr Vickki Muff here in f/u.  Has a right ankle sprain.  Was in a boot.  Now wearing a shoe.  Still with pain.  Now mostly taking tylenol and ibuprofen.   Eating and drinking well.   No notice of acid reflux.  Bowels stable.  Takes fiber daily.  Makes her regular.  Still having some issues with her bladder.  On imipramine.  Has been seeing Dr Jacqlyn Larsen.  Had seen Dr Bary Castilla for her breast.  He had recommended a 6 month f/u.  He then saw her and recommended keeping her on a yearly schedule.  Had mammogram 08/30/13 - [facility] I.       Past Medical History  Diagnosis Date  . Degenerative disk disease   . GERD (gastroesophageal reflux disease)   . Interstitial cystitis     followed by Dr Jacqlyn Larsen  . Cancer 1992    skin  . Hypercholesterolemia 2008  . Personal history of tobacco use, presenting hazards to health   . Breast screening, unspecified 2013  . Special screening for malignant neoplasms, colon   . Other sign and symptom in breast 2013    Left upper outer quadrant breast "soreness" Ultrasound exam of right breast in the 2 o'clock position with the breast distracted medially showed a 0.3-0.4 with 0.5 cm simple cyst. In the 1 o'clock position where pt reported tenderness US exam was negatiive.  Because of her history of intermittent nipple drainage, ultrasound was completed of the retroareolar area.    Outpatient Encounter Prescriptions  as of 07/06/2014  Medication Sig  . acetaminophen (TYLENOL) 325 MG tablet Take 650 mg by mouth as needed.  Marland Kitchen aspirin EC 81 MG tablet Take 81 mg by mouth daily.  Marland Kitchen HYDROcodone-acetaminophen (NORCO/VICODIN) 5-325 MG per tablet Take 1-2 tablets by mouth every 6 (six) hours as needed for moderate pain.  Marland Kitchen ibuprofen (ADVIL,MOTRIN) 200 MG tablet Take 200 mg by mouth every 6 (six) hours as needed.  Marland Kitchen imipramine (TOFRANIL) 25 MG tablet Take 50 mg by mouth at bedtime.   . lovastatin (MEVACOR) 20 MG tablet TAKE ONE TABLET BY MOUTH EVERY NIGHT AT BEDTIME  . Multiple Vitamin (MULTI-VITAMIN DAILY PO) Take 1 tablet by mouth daily.  Marland Kitchen omeprazole (PRILOSEC) 20 MG capsule Take 1 capsule (20 mg total) by mouth 2 (two) times daily before a meal.  . sertraline (ZOLOFT) 50 MG tablet Take 1 tablet (50 mg total) by mouth daily.    Review of Systems Patient denies any headache, lightheadedness or dizziness.  No significant sinus or allergy symptoms.  No chest pain, tightness or palpitations.  No increased shortness of breath, cough or congestion.  No nausea or vomiting.  No abdominal pain reported.   No bowel change, such as diarrhea, constipation, BRBPR or melana.  Fiber keeps her regular.   No significant urine change.  On imipramine.  Has been seeing Dr Jacqlyn Larsen.  Increased stress with her husband's medical issues.  Stopped her zoloft when she hurt her foot.  Only took for a brief period.  Right ankle sprain as outlined. Seeing Dr Vickki Muff.  Still with increased pain.  Taking tylenol and ibuprofen.           Objective:   Physical Exam  Filed Vitals:   07/06/14 1521  BP: 124/70  Pulse: 92  Temp: 98.4 F (38.41 C)   73 year old female in no acute distress.   HEENT:  Nares- clear.  Oropharynx - without lesions.   NECK:  Supple.  Nontender.  No audible bruit.  HEART:  Appears to be regular. LUNGS:  No crackles or wheezing audible.  Respirations even and unlabored.  RADIAL PULSE:  Equal bilaterally.  ABDOMEN:  Soft,  nontender.  Bowel sounds present and normal.  No audible abdominal bruit.     EXTREMITIES:  No increased edema present.  Some pedal edema - right.       Assessment & Plan:  BREAST EXAM.  Saw Dr Bary Castilla.  Recommended keeping her on a yearly schedule.   Mammogram 08/30/13 - [facility] I.   HEALTH MAINTENANCE.  Physical 08/15/13.   Is s/p hysterectomy and does not require yearly pap smears.  Overdue colonoscopy.  Was referred to GI for colon screening.  She will notify me when agreeable.  IFOB 09/19/13 - negative.  She has declined bone density. Mammogram as outlined above.  Saw Dr Bary Castilla.  Recommended keeping her on a yearly schedule.  Mammogram 08/30/13 - Birads I.    I spent 25 minutes with the patient and more than 50% of the time was spent in consultation regarding the above.

## 2014-07-06 NOTE — Progress Notes (Signed)
Pre visit review using our clinic review tool, if applicable. No additional management support is needed unless otherwise documented below in the visit note. 

## 2014-07-10 ENCOUNTER — Encounter: Payer: Self-pay | Admitting: Internal Medicine

## 2014-07-10 DIAGNOSIS — S96911A Strain of unspecified muscle and tendon at ankle and foot level, right foot, initial encounter: Secondary | ICD-10-CM | POA: Insufficient documentation

## 2014-07-10 NOTE — Assessment & Plan Note (Signed)
Seeing Dr Vickki Muff.  Still with increased pain.  Taking tylenol and ibuprofen.  Follow.

## 2014-07-10 NOTE — Assessment & Plan Note (Signed)
Taking her omeprazole.  Currently stable.

## 2014-07-10 NOTE — Assessment & Plan Note (Signed)
On imipramine.  Has been ollowing with Dr.  Jacqlyn Larsen.

## 2014-07-10 NOTE — Assessment & Plan Note (Signed)
Low cholesterol diet and exercise.  Continue lovastatin.  Follow lipid panel and liver function.

## 2014-07-10 NOTE — Assessment & Plan Note (Signed)
Increased stress as outlined.  Talks with her pastor.  Does not feel she need further counseling.  Restart zoloft.  Follow.

## 2014-07-27 DIAGNOSIS — R259 Unspecified abnormal involuntary movements: Secondary | ICD-10-CM | POA: Insufficient documentation

## 2014-07-31 ENCOUNTER — Encounter: Payer: Self-pay | Admitting: Internal Medicine

## 2014-08-16 ENCOUNTER — Other Ambulatory Visit: Payer: Self-pay | Admitting: Internal Medicine

## 2014-09-15 ENCOUNTER — Other Ambulatory Visit (INDEPENDENT_AMBULATORY_CARE_PROVIDER_SITE_OTHER): Payer: Medicare Other

## 2014-09-15 DIAGNOSIS — Z658 Other specified problems related to psychosocial circumstances: Secondary | ICD-10-CM

## 2014-09-15 DIAGNOSIS — F439 Reaction to severe stress, unspecified: Secondary | ICD-10-CM

## 2014-09-15 DIAGNOSIS — E7800 Pure hypercholesterolemia, unspecified: Secondary | ICD-10-CM

## 2014-09-15 DIAGNOSIS — R739 Hyperglycemia, unspecified: Secondary | ICD-10-CM

## 2014-09-15 LAB — COMPREHENSIVE METABOLIC PANEL
ALT: 10 U/L (ref 0–35)
AST: 15 U/L (ref 0–37)
Albumin: 4.4 g/dL (ref 3.5–5.2)
Alkaline Phosphatase: 86 U/L (ref 39–117)
BUN: 8 mg/dL (ref 6–23)
CALCIUM: 9.3 mg/dL (ref 8.4–10.5)
CO2: 27 mEq/L (ref 19–32)
CREATININE: 0.9 mg/dL (ref 0.4–1.2)
Chloride: 104 mEq/L (ref 96–112)
GFR: 65.78 mL/min (ref >60.00)
Glucose, Bld: 95 mg/dL (ref 70–99)
Potassium: 4.5 mEq/L (ref 3.5–5.1)
Sodium: 138 mEq/L (ref 135–145)
Total Bilirubin: 0.8 mg/dL (ref 0.2–1.2)
Total Protein: 6.7 g/dL (ref 6.0–8.3)

## 2014-09-15 LAB — LIPID PANEL
Cholesterol: 203 mg/dL — ABNORMAL HIGH (ref 0–200)
HDL: 68.5 mg/dL (ref >39.00)
LDL Cholesterol: 119 mg/dL — ABNORMAL HIGH (ref 0–99)
NONHDL: 134.5
Total CHOL/HDL Ratio: 3
Triglycerides: 79 mg/dL (ref 0.0–149.0)
VLDL: 15.8 mg/dL (ref 0.0–40.0)

## 2014-09-15 LAB — HEMOGLOBIN A1C: Hgb A1c MFr Bld: 5.8 % (ref 4.6–6.5)

## 2014-09-18 ENCOUNTER — Other Ambulatory Visit: Payer: Medicare Other

## 2014-09-18 DIAGNOSIS — E038 Other specified hypothyroidism: Secondary | ICD-10-CM

## 2014-09-19 ENCOUNTER — Ambulatory Visit (INDEPENDENT_AMBULATORY_CARE_PROVIDER_SITE_OTHER): Payer: Medicare Other | Admitting: Internal Medicine

## 2014-09-19 ENCOUNTER — Encounter: Payer: Self-pay | Admitting: Internal Medicine

## 2014-09-19 VITALS — BP 138/70 | HR 89 | Temp 98.3°F | Ht 64.5 in | Wt 159.5 lb

## 2014-09-19 DIAGNOSIS — F439 Reaction to severe stress, unspecified: Secondary | ICD-10-CM

## 2014-09-19 DIAGNOSIS — N3010 Interstitial cystitis (chronic) without hematuria: Secondary | ICD-10-CM

## 2014-09-19 DIAGNOSIS — R251 Tremor, unspecified: Secondary | ICD-10-CM

## 2014-09-19 DIAGNOSIS — Z0000 Encounter for general adult medical examination without abnormal findings: Secondary | ICD-10-CM

## 2014-09-19 DIAGNOSIS — R109 Unspecified abdominal pain: Secondary | ICD-10-CM

## 2014-09-19 DIAGNOSIS — Z658 Other specified problems related to psychosocial circumstances: Secondary | ICD-10-CM

## 2014-09-19 DIAGNOSIS — K219 Gastro-esophageal reflux disease without esophagitis: Secondary | ICD-10-CM

## 2014-09-19 DIAGNOSIS — E7800 Pure hypercholesterolemia, unspecified: Secondary | ICD-10-CM

## 2014-09-19 LAB — TSH: TSH: 3.855 u[IU]/mL (ref 0.350–4.500)

## 2014-09-19 NOTE — Progress Notes (Signed)
Pre visit review using our clinic review tool, if applicable. No additional management support is needed unless otherwise documented below in the visit note. 

## 2014-09-25 ENCOUNTER — Encounter: Payer: Self-pay | Admitting: Internal Medicine

## 2014-09-25 NOTE — Addendum Note (Signed)
Addended by: Alisa Graff on: 09/25/2014 03:58 PM   Modules accepted: Orders, SmartSet

## 2014-09-25 NOTE — Progress Notes (Signed)
Subjective:    [name], female    DOB: Nov 18, 1940, 73 y.o.   MRN: [id number]  HPI The patient is here for annual Medicare wellness examination and management of other chronic and acute problems and for her physical exam.    The risk factors are reflected in the social history.  States quit smoking in 1981.  No alcohol or illicit drug use.    The roster of all physicians providing medical care to patient -  Dr Almon Register - ophthalmologist Dr Sharlett Iles - dentist  Activities of daily living:  The patient is 100% independent in all ADLs: dressing, toileting, feeding as well as independent mobility  Home safety : The patient has smoke detectors in the home. She wears seatbelts.  There are no firearms at home. There is no violence in the home.   There is no risks for hepatitis, STDs or HIV. There is no history of blood transfusion. They have no travel history to infectious disease endemic areas of the world.  The patient has seen her dentist in the last six month. She has seen her eye doctor in the last year. She admits to slight hearing difficulty with regard to whispered voices.  She reports hearing ringing in her ears (occasionally).   She has had formal hearing evaluation.  She does not  have excessive sun exposure. Discussed the need for sun protection: hats, long sleeves and use of sunscreen if there is significant sun exposure.   Diet: the importance of a healthy diet is discussed.  She tries to watch what she eats.  Has not been able to exercise secondary to her foot injury.  Does stretches.    The benefits of regular aerobic exercise were discussed.     Depression screen: there are no signs or vegatative symptoms of depression- irritability, change in appetite, anhedonia, sadness/tearfullness.  Cognitive assessment: the patient manages all her financial and personal affairs and is actively engaged. She could relate day,date,year and events; recalled 3/3 objects at  3 minutes; performed clock-face test normally.  Able to do serial 7s.    The following portions of the patient's history were reviewed and updated as appropriate: allergies, current medications, past family history, past medical history,  past surgical history, past social history  and problem list.  Visual acuity was not assessed since she has regular follow up with her ophthalmologist. Hearing and body mass index were reviewed.   During the course of the visit the patient was educated and counseled about appropriate screening and preventive services including : fall prevention , diabetes screening, nutrition counseling, colorectal cancer screening, and recommended immunizations.     Review of Systems Patient denies any headache, lightheadedness or dizziness.  No sinus or allergy symptoms.  No chest pain, tightness or palpitations. No increased shortness of breath, cough or congestion.  No nausea or vomiting.  No abdominal pain or cramping.  Acid reflux controlled.  On prilosec.  No bowel change, such as diarrhea, constipation, BRBPR or melana.  No urine change.  She does report noticing a tremor.  No problems eating.  Increased stress.  On zoloft.  Not taking regularly.  We discussed taking regularly.       Objective:   Physical Exam Filed Vitals:   09/19/14 1420  BP: 138/70  Pulse: 89  Temp: 98.3 F (3.44 C)   73 year old female in no acute distress.   HEENT:  Nares- clear.  Oropharynx - without lesions. NECK:  Supple.  Nontender.  No audible bruit.  HEART:  Appears to be regular. LUNGS:  No crackles or wheezing audible.  Respirations even and unlabored.  RADIAL PULSE:  Equal bilaterally.    BREASTS:  No nipple discharge or nipple retraction present.  Could not appreciate any distinct nodules or axillary adenopathy.  ABDOMEN:  Soft, nontender.  Bowel sounds present and normal.  No audible abdominal bruit.  GU:  Not performed.     EXTREMITIES:  No increased edema present.  DP pulses  palpable and equal bilaterally.   NEURO:  No cogwheel.  Minimal tremor with outstretched  Hands.  No focal neuro deficits.        Assessment & Plan:  This is a routine wellness examination for this patient.  I reviewed all health maintenance protocols including mammography, colonoscopy, bone density.  Needed referrals placed.  Age and diagnosis appropriate screening labs were ordered.  Her immunization history was reviewed and appropriate vaccinations were ordered.  Her current medications and allergies were reviewed and refills of her chronic medications were ordered if needed.  The plan for yearly health maintenance was discussed.     MEDICARE ATTESTATION I have personally reviewed:  The patient's medical and social history. The use of alcohol, tobacco and illicit drugs. The current medications and supplements. The patient's function ability including ADLs, fall risks, home safety risks, cognitive, and hearing and visual impairment.   Diet and physical activities. Evaluation for depression and mood disorders.    The patient's weight, height, BMI have been recorded in the chart.  I have made referrals, counseled and provided education to the patient based on review of the above.   Pt was given a list of screening tests and procedures that are recommended for women her age.    Other problems addressed during this visit:   Tremor Persistent.  No problems eating.  No cogwheel noted on exam.  No focal neuro deficits noted.  Refer to neurology for further evaluation.   Stress Increased stress as outlined.  Has been taking zoloft prn.  Take regularly.  Follow.    Abdominal pain, unspecified abdominal location Resolved.    Hypercholesterolemia Low cholesterol diet and exercise. On lovastatin.  Follow lipid panel and liver function tests.  Lab Results  Component Value Date   CHOL 203* 09/15/2014   HDL 68.50 09/15/2014   LDLCALC 119* 09/15/2014   TRIG 79.0 09/15/2014   CHOLHDL 3  09/15/2014   Interstitial cystitis Followed by Dr Jacqlyn Larsen.  Stable.    Gastroesophageal reflux disease, esophagitis presence not specified Controlled on prilosec.  Follow.    HEALTH MAINTENANCE.  Physical today.  Is s/p hysterectomy and does not require yearly pap smears.  Will notify me when agreeable for colonoscopy.  She has declined bone density.  Mammogram 08/30/13 - Birads I.

## 2014-11-16 ENCOUNTER — Other Ambulatory Visit: Payer: Self-pay | Admitting: Internal Medicine

## 2014-11-17 ENCOUNTER — Other Ambulatory Visit: Payer: Self-pay | Admitting: Internal Medicine

## 2014-11-24 ENCOUNTER — Ambulatory Visit: Payer: Self-pay | Admitting: Neurology

## 2015-01-17 ENCOUNTER — Ambulatory Visit: Payer: Medicare Other | Admitting: Internal Medicine

## 2015-01-17 ENCOUNTER — Telehealth: Payer: Self-pay | Admitting: *Deleted

## 2015-01-17 NOTE — Telephone Encounter (Signed)
See previous message. Pt already has refills remaining on her Lovastatin. A 90 day supply was sent in on 11/16/14 with one additional refill.

## 2015-01-17 NOTE — Telephone Encounter (Signed)
Can schedule for 02/13/15 at 3:00 - block 30 minutes.

## 2015-02-06 ENCOUNTER — Other Ambulatory Visit: Payer: Self-pay | Admitting: Internal Medicine

## 2015-02-06 NOTE — Telephone Encounter (Signed)
Last OV 12.22.15, last refill 4.23.16 next appoint. 5.17.16.  Please advise refill

## 2015-02-06 NOTE — Telephone Encounter (Signed)
Refilled zoloft - #90 with one refill.  

## 2015-02-13 ENCOUNTER — Ambulatory Visit (INDEPENDENT_AMBULATORY_CARE_PROVIDER_SITE_OTHER): Payer: Medicare Other | Admitting: Internal Medicine

## 2015-02-13 ENCOUNTER — Encounter: Payer: Self-pay | Admitting: Internal Medicine

## 2015-02-13 VITALS — BP 128/78 | HR 76 | Temp 97.8°F | Ht 64.5 in | Wt 157.5 lb

## 2015-02-13 DIAGNOSIS — K219 Gastro-esophageal reflux disease without esophagitis: Secondary | ICD-10-CM

## 2015-02-13 DIAGNOSIS — E7800 Pure hypercholesterolemia, unspecified: Secondary | ICD-10-CM

## 2015-02-13 DIAGNOSIS — N3010 Interstitial cystitis (chronic) without hematuria: Secondary | ICD-10-CM | POA: Diagnosis not present

## 2015-02-13 DIAGNOSIS — F439 Reaction to severe stress, unspecified: Secondary | ICD-10-CM

## 2015-02-13 DIAGNOSIS — R251 Tremor, unspecified: Secondary | ICD-10-CM

## 2015-02-13 DIAGNOSIS — Z0000 Encounter for general adult medical examination without abnormal findings: Secondary | ICD-10-CM

## 2015-02-13 DIAGNOSIS — Z1239 Encounter for other screening for malignant neoplasm of breast: Secondary | ICD-10-CM

## 2015-02-13 DIAGNOSIS — Z658 Other specified problems related to psychosocial circumstances: Secondary | ICD-10-CM

## 2015-02-13 DIAGNOSIS — R030 Elevated blood-pressure reading, without diagnosis of hypertension: Secondary | ICD-10-CM

## 2015-02-13 DIAGNOSIS — IMO0001 Reserved for inherently not codable concepts without codable children: Secondary | ICD-10-CM

## 2015-02-13 MED ORDER — LOVASTATIN 20 MG PO TABS: 20.0000 mg | ORAL_TABLET | Freq: Every day | ORAL | Status: DC

## 2015-02-13 NOTE — Progress Notes (Signed)
Pre visit review using our clinic review tool, if applicable. No additional management support is needed unless otherwise documented below in the visit note. 

## 2015-02-14 ENCOUNTER — Encounter: Payer: Self-pay | Admitting: Internal Medicine

## 2015-02-14 DIAGNOSIS — Z0000 Encounter for general adult medical examination without abnormal findings: Secondary | ICD-10-CM | POA: Insufficient documentation

## 2015-02-14 DIAGNOSIS — I10 Essential (primary) hypertension: Secondary | ICD-10-CM | POA: Insufficient documentation

## 2015-02-14 DIAGNOSIS — R030 Elevated blood-pressure reading, without diagnosis of hypertension: Secondary | ICD-10-CM | POA: Insufficient documentation

## 2015-02-14 NOTE — Assessment & Plan Note (Signed)
On omeprazole.  Symptoms controlled.  Follow.   

## 2015-02-14 NOTE — Assessment & Plan Note (Signed)
Physical 09/19/14.   S/p hysterectomy.  Overdue mammogram.  Discussed with her today.  She will schedule her own.  Will let me know when agreeable for colonoscopy.  Has declined bone density.

## 2015-02-14 NOTE — Assessment & Plan Note (Signed)
Low cholesterol diet and exercise.  On lovastatin.  Follow lipid panel and liver function tests.   

## 2015-02-14 NOTE — Assessment & Plan Note (Signed)
Has been followed by Dr Jacqlyn Larsen.  Will let me know when ready for referral back.  Wants to hold at this point.

## 2015-02-14 NOTE — Progress Notes (Signed)
[name], female   DOB: 02/13/1941, 74 y.o.   MRN: [id number]   Subjective:    [name], female    DOB: 01-Feb-1941, 74 y.o.   MRN: [id number]  HPI  Patient here for a scheduled follow up.  Increased stress with her husband's medical issues.  Having to do all the work around the house.  Some fatigue.  She relates to the above.  Tremor stable.  Saw Dr Manuella Ghazi.  Desires no further intervention.  Tries to stay active.  No cardiac symptoms with increased activity or exertion.  Eating and drinking well.  Bowels stable.  Discussed f/u with urology.    Past Medical History  Diagnosis Date  . Degenerative disk disease   . GERD (gastroesophageal reflux disease)   . Interstitial cystitis     followed by Dr Jacqlyn Larsen  . Cancer 1992    skin  . Hypercholesterolemia 2008  . Personal history of tobacco use, presenting hazards to health   . Breast screening, unspecified 2013  . Special screening for malignant neoplasms, colon   . Other sign and symptom in breast 2013    Left upper outer quadrant breast "soreness" Ultrasound exam of right breast in the 2 o'clock position with the breast distracted medially showed a 0.3-0.4 with 0.5 cm simple cyst. In the 1 o'clock position where pt reported tenderness US exam was negatiive.  Because of her history of intermittent nipple drainage, ultrasound was completed of the retroareolar area.     Current Outpatient Prescriptions on File Prior to Visit  Medication Sig Dispense Refill  . acetaminophen (TYLENOL) 325 MG tablet Take 650 mg by mouth as needed.    Marland Kitchen aspirin EC 81 MG tablet Take 81 mg by mouth daily.    Marland Kitchen ibuprofen (ADVIL,MOTRIN) 200 MG tablet Take 200 mg by mouth every 6 (six) hours as needed.    Marland Kitchen imipramine (TOFRANIL) 25 MG tablet Take 50 mg by mouth at bedtime.     . Multiple Vitamin (MULTI-VITAMIN DAILY PO) Take 1 tablet by mouth daily.    Marland Kitchen omeprazole (PRILOSEC) 20 MG capsule TAKE ONE (1) CAPSULE BY MOUTH 2 TIMES  DAILY 180 capsule 1  . sertraline (ZOLOFT) 50 MG tablet TAKE ONE (1) TABLET BY MOUTH EVERY DAY (Patient not taking: Reported on 02/13/2015) 90 tablet 1   No current facility-administered medications on file prior to visit.    Review of Systems  Constitutional: Positive for fatigue. Negative for appetite change and unexpected weight change.  HENT: Negative for congestion and sinus pressure.   Respiratory: Negative for cough, chest tightness and shortness of breath.   Cardiovascular: Negative for chest pain, palpitations and leg swelling.  Gastrointestinal: Negative for nausea, vomiting, abdominal pain and diarrhea.  Skin: Negative for color change and rash.  Neurological: Negative for dizziness, light-headedness and headaches.  Psychiatric/Behavioral: Negative for dysphoric mood.       Increased stress as outlined.         Objective:     Blood pressure recheck:  128/78  Physical Exam  Constitutional: She appears well-developed and well-nourished. No distress.  HENT:  Nose: Nose normal.  Mouth/Throat: Oropharynx is clear and moist.  Neck: Neck supple. No thyromegaly present.  Cardiovascular: Normal rate and regular rhythm.   Pulmonary/Chest: Breath sounds normal. No respiratory distress. She has no wheezes.  Abdominal: Soft. Bowel sounds are normal. There is no tenderness.  Musculoskeletal: She exhibits no edema or tenderness.  Lymphadenopathy:    She  has no cervical adenopathy.  Skin: No rash noted. No erythema.  Psychiatric: She has a normal mood and affect. Her behavior is normal.    BP 128/78 mmHg  Pulse 76  Temp(Src) 97.8 F (36.6 C) (Oral)  Ht 5' 4.5" (1.638 m)  Wt 157 lb 8 oz (71.442 kg)  BMI 26.63 kg/m2  SpO2 98%  LMP 08/09/2012 Wt Readings from Last 3 Encounters:  02/13/15 157 lb 8 oz (71.442 kg)  09/19/14 159 lb 8 oz (72.349 kg)  07/06/14 154 lb (69.854 kg)     Lab Results  Component Value Date   WBC 6.1 02/21/2014   HGB 13.0 02/21/2014   HCT 39.3  02/21/2014   PLT 230.0 02/21/2014   GLUCOSE 95 09/15/2014   CHOL 203* 09/15/2014   TRIG 79.0 09/15/2014   HDL 68.50 09/15/2014   LDLCALC 119* 09/15/2014   ALT 10 09/15/2014   AST 15 09/15/2014   NA 138 09/15/2014   K 4.5 09/15/2014   CL 104 09/15/2014   CREATININE 0.9 09/15/2014   BUN 8 09/15/2014   CO2 27 09/15/2014   TSH 3.855 09/18/2014   HGBA1C 5.8 09/15/2014       Assessment & Plan:   Problem List Items Addressed This Visit    Elevated blood pressure    Blood pressure on recheck wnl.  Follow.  Check metabolic panel with next labs.       Relevant Orders   Basic metabolic panel   GERD (gastroesophageal reflux disease)    On omeprazole.  Symptoms controlled.  Follow.       Relevant Orders   CBC with Differential/Platelet   Health care maintenance    Physical 09/19/14.   S/p hysterectomy.  Overdue mammogram.  Discussed with her today.  She will schedule her own.  Will let me know when agreeable for colonoscopy.  Has declined bone density.       Hypercholesterolemia - Primary    Low cholesterol diet and exercise.  On lovastatin.  Follow lipid panel and liver function tests.        Relevant Medications   lovastatin (MEVACOR) 20 MG tablet   Other Relevant Orders   Lipid panel   Hepatic function panel   Interstitial cystitis    Has been followed by Dr Jacqlyn Larsen.  Will let me know when ready for referral back.  Wants to hold at this point.        Stress    Increased stress as outlined.  Discussed at length with her today.  Desires no further intervention at this time.  Follow.       Tremor    Saw Dr Manuella Ghazi.  See his note for details.  MRI without acute changes.  She desires no further intervention at this time.         Other Visit Diagnoses    Breast cancer screening        Relevant Orders    MM DIGITAL SCREENING BILATERAL      I spent 25 minutes with the patient and more than 50% of the time was spent in consultation regarding the above.     Einar Pheasant, MD

## 2015-02-14 NOTE — Assessment & Plan Note (Signed)
Saw Dr Manuella Ghazi.  See his note for details.  MRI without acute changes.  She desires no further intervention at this time.

## 2015-02-14 NOTE — Assessment & Plan Note (Signed)
Increased stress as outlined.  Discussed at length with her today.  Desires no further intervention at this time.  Follow.

## 2015-02-14 NOTE — Assessment & Plan Note (Signed)
Blood pressure on recheck wnl.  Follow.  Check metabolic panel with next labs.

## 2015-02-19 DIAGNOSIS — Z87898 Personal history of other specified conditions: Secondary | ICD-10-CM | POA: Insufficient documentation

## 2015-02-19 DIAGNOSIS — Z85820 Personal history of malignant melanoma of skin: Secondary | ICD-10-CM | POA: Insufficient documentation

## 2015-02-22 ENCOUNTER — Other Ambulatory Visit: Payer: Medicare Other

## 2015-05-01 ENCOUNTER — Other Ambulatory Visit (INDEPENDENT_AMBULATORY_CARE_PROVIDER_SITE_OTHER): Payer: 59

## 2015-05-01 DIAGNOSIS — R030 Elevated blood-pressure reading, without diagnosis of hypertension: Secondary | ICD-10-CM

## 2015-05-01 DIAGNOSIS — K219 Gastro-esophageal reflux disease without esophagitis: Secondary | ICD-10-CM

## 2015-05-01 DIAGNOSIS — E7800 Pure hypercholesterolemia, unspecified: Secondary | ICD-10-CM

## 2015-05-01 DIAGNOSIS — IMO0001 Reserved for inherently not codable concepts without codable children: Secondary | ICD-10-CM

## 2015-05-01 LAB — BASIC METABOLIC PANEL
BUN: 9 mg/dL (ref 6–23)
CO2: 30 meq/L (ref 19–32)
Calcium: 9.3 mg/dL (ref 8.4–10.5)
Chloride: 102 mEq/L (ref 96–112)
Creatinine, Ser: 0.91 mg/dL (ref 0.40–1.20)
GFR: 64 mL/min (ref >60.00)
GLUCOSE: 95 mg/dL (ref 70–99)
Potassium: 4.2 mEq/L (ref 3.5–5.1)
Sodium: 139 mEq/L (ref 135–145)

## 2015-05-01 LAB — LIPID PANEL
CHOLESTEROL: 177 mg/dL (ref 0–200)
HDL: 76.1 mg/dL (ref >39.00)
LDL CALC: 89 mg/dL (ref 0–99)
NonHDL: 100.52
Total CHOL/HDL Ratio: 2
Triglycerides: 59 mg/dL (ref 0.0–149.0)
VLDL: 11.8 mg/dL (ref 0.0–40.0)

## 2015-05-01 LAB — CBC WITH DIFFERENTIAL/PLATELET
Basophils Absolute: 0 10*3/uL (ref 0.0–0.1)
Basophils Relative: 0.8 % (ref 0.0–3.0)
EOS ABS: 0.1 10*3/uL (ref 0.0–0.7)
Eosinophils Relative: 1.1 % (ref 0.0–5.0)
HCT: 40.7 % (ref 36.0–46.0)
HEMOGLOBIN: 13.7 g/dL (ref 12.0–15.0)
LYMPHS ABS: 1.8 10*3/uL (ref 0.7–4.0)
Lymphocytes Relative: 31.7 % (ref 12.0–46.0)
MCHC: 33.6 g/dL (ref 30.0–36.0)
MCV: 89.7 fl (ref 78.0–100.0)
MONO ABS: 0.5 10*3/uL (ref 0.1–1.0)
Monocytes Relative: 8.3 % (ref 3.0–12.0)
NEUTROS ABS: 3.3 10*3/uL (ref 1.4–7.7)
Neutrophils Relative %: 58.1 % (ref 43.0–77.0)
Platelets: 230 10*3/uL (ref 150.0–400.0)
RBC: 4.54 Mil/uL (ref 3.87–5.11)
RDW: 13.3 % (ref 11.5–15.5)
WBC: 5.7 10*3/uL (ref 4.0–10.5)

## 2015-05-01 LAB — HEPATIC FUNCTION PANEL
ALBUMIN: 4.4 g/dL (ref 3.5–5.2)
ALT: 10 U/L (ref 0–35)
AST: 16 U/L (ref 0–37)
Alkaline Phosphatase: 78 U/L (ref 39–117)
Bilirubin, Direct: 0.1 mg/dL (ref 0.0–0.3)
TOTAL PROTEIN: 7 g/dL (ref 6.0–8.3)
Total Bilirubin: 0.6 mg/dL (ref 0.2–1.2)

## 2015-05-02 ENCOUNTER — Encounter: Payer: Self-pay | Admitting: *Deleted

## 2015-05-23 ENCOUNTER — Telehealth: Payer: Self-pay | Admitting: Internal Medicine

## 2015-05-23 ENCOUNTER — Ambulatory Visit (INDEPENDENT_AMBULATORY_CARE_PROVIDER_SITE_OTHER): Payer: 59 | Admitting: Family Medicine

## 2015-05-23 ENCOUNTER — Encounter: Payer: Self-pay | Admitting: Family Medicine

## 2015-05-23 VITALS — BP 130/74 | HR 90 | Temp 98.5°F | Ht 64.5 in | Wt 154.2 lb

## 2015-05-23 DIAGNOSIS — J029 Acute pharyngitis, unspecified: Secondary | ICD-10-CM | POA: Diagnosis not present

## 2015-05-23 DIAGNOSIS — R1310 Dysphagia, unspecified: Secondary | ICD-10-CM | POA: Diagnosis not present

## 2015-05-23 DIAGNOSIS — R490 Dysphonia: Secondary | ICD-10-CM

## 2015-05-23 NOTE — Patient Instructions (Signed)
It was nice to see you today.  We will be in touch regarding your ENT referral.  Take the omeprazole twice daily.  Follow up with Dr. Nicki Reaper following your ENT appt.  Take care  Dr. Lacinda Axon

## 2015-05-23 NOTE — Progress Notes (Signed)
Pre visit review using our clinic review tool, if applicable. No additional management support is needed unless otherwise documented below in the visit note. 

## 2015-05-23 NOTE — Telephone Encounter (Signed)
Pt states she has

## 2015-05-23 NOTE — Assessment & Plan Note (Signed)
Unclear etiology although I suspect reflux is playing a role. Given hoarseness as well as dysphagia I recommended ENT evaluation for laryngoscopy to ensure no underlying malignancy given smoking history. Advised compliance with Omeprazole 20 mg BID. May need GI evaluation/swallow study following ENT evaluation.

## 2015-05-23 NOTE — Progress Notes (Signed)
   Subjective:  [name], female    DOB: [birth year]  Age: 74 y.o. MRN: [id number]  CC: Sore throat  HPI 74 year old female with a PMH of GERD presents with complaints of sore throat.  1) Sore throat  Has been present for 6 months.  She reports associated intermittent hoarseness and difficulty swallowing (pills, meats; no difficulty with fluids).  No known inciting factor.  No relieving factors. Sore throat exacerbated by swallowing.  She does have a history of GERD. She has been recently cutting back on her medication as she feels that it exacerbates her interstitial cystitis.   ROS: No fever, chills, weight loss, night sweats.  Social Hx - Former smoker.  Review of Systems  Constitutional: Negative for fever, chills and unexpected weight change.  HENT: Positive for sore throat, trouble swallowing and voice change.    Objective:  BP 130/74 mmHg  Pulse 90  Temp(Src) 98.5 F (36.9 C) (Oral)  Ht 5' 4.5" (1.638 m)  Wt 154 lb 4 oz (69.967 kg)  BMI 26.08 kg/m2  SpO2 99%  LMP 08/09/2012  BP/Weight 05/23/2015 02/13/2015 87/56/4332  Systolic BP 951 884 166  Diastolic BP 74 78 70  Wt. (Lbs) 154.25 157.5 159.5  BMI 26.08 26.63 26.97   Physical Exam  Constitutional: She is oriented to person, place, and time. She appears well-developed and well-nourished.  HENT:  Head: Normocephalic and atraumatic.  Mouth/Throat: No oropharyngeal exudate.  Normal TM's bilaterally. Oropharynx with erythema.  Cardiovascular: Normal rate and regular rhythm.   Pulmonary/Chest: Effort normal and breath sounds normal. No respiratory distress. She has no wheezes. She has no rales.  Abdominal: Soft. She exhibits no distension. There is no tenderness. There is no rebound and no guarding.  Neurological: She is alert and oriented to person, place, and time.  Psychiatric: She has a normal mood and affect.  Vitals reviewed.  Assessment & Plan:   Problem List Items Addressed This  Visit    Sore throat - Primary    Unclear etiology although I suspect reflux is playing a role. Given hoarseness as well as dysphagia I recommended ENT evaluation for laryngoscopy to ensure no underlying malignancy given smoking history. Advised compliance with Omeprazole 20 mg BID. May need GI evaluation/swallow study following ENT evaluation.       Relevant Orders   Ambulatory referral to ENT    Other Visit Diagnoses    Hoarseness        Relevant Orders    Ambulatory referral to ENT    Dysphagia        Relevant Orders    Ambulatory referral to ENT       Follow-up: PRN (as previously scheduled)    Thersa Salt, DO

## 2015-07-04 ENCOUNTER — Other Ambulatory Visit: Payer: Self-pay | Admitting: Internal Medicine

## 2015-07-04 ENCOUNTER — Ambulatory Visit
Admission: RE | Admit: 2015-07-04 | Discharge: 2015-07-04 | Disposition: A | Discharge status: Home / Self Care | Payer: Medicare Other | Source: Ambulatory Visit | Attending: Internal Medicine | Admitting: Internal Medicine

## 2015-07-04 DIAGNOSIS — Z1231 Encounter for screening mammogram for malignant neoplasm of breast: Secondary | ICD-10-CM | POA: Diagnosis not present

## 2015-07-04 DIAGNOSIS — Z1239 Encounter for other screening for malignant neoplasm of breast: Secondary | ICD-10-CM

## 2015-10-29 ENCOUNTER — Encounter: Payer: 59 | Admitting: Internal Medicine

## 2015-11-05 ENCOUNTER — Other Ambulatory Visit: Payer: Self-pay | Admitting: Internal Medicine

## 2016-01-09 ENCOUNTER — Telehealth: Payer: Self-pay | Admitting: Internal Medicine

## 2016-01-09 NOTE — Telephone Encounter (Signed)
Lm for pt to call back and schedule a physical.. Please schedule when pt calls back

## 2016-01-23 ENCOUNTER — Telehealth: Payer: Self-pay | Admitting: *Deleted

## 2016-01-23 ENCOUNTER — Other Ambulatory Visit: Payer: Self-pay | Admitting: *Deleted

## 2016-01-23 MED ORDER — LOVASTATIN 20 MG PO TABS: 20.0000 mg | ORAL_TABLET | Freq: Every day | ORAL | Status: DC

## 2016-01-23 NOTE — Telephone Encounter (Signed)
Rx refilled until for #90. I also spoke with patient to inform her that she will need to keep her appt on 04/07/16 for additional refills. She was last seen in May 2016 & no showed for CPE in January. Pt stated that she has been consumed with taking care of her husband whom passed recently & appreciated the courtesy refill.

## 2016-01-23 NOTE — Telephone Encounter (Signed)
Patient requested a medication refill for lovastatin  Ph[facility] in [place]

## 2016-02-01 ENCOUNTER — Other Ambulatory Visit: Payer: Self-pay | Admitting: Internal Medicine

## 2016-02-05 ENCOUNTER — Encounter: Payer: Self-pay | Admitting: Gastroenterology

## 2016-02-05 ENCOUNTER — Ambulatory Visit (INDEPENDENT_AMBULATORY_CARE_PROVIDER_SITE_OTHER): Payer: Medicare Other | Admitting: Gastroenterology

## 2016-02-05 VITALS — BP 154/71 | HR 94 | Temp 97.9°F | Ht 64.5 in | Wt 154.0 lb

## 2016-02-05 DIAGNOSIS — R1310 Dysphagia, unspecified: Secondary | ICD-10-CM

## 2016-02-05 DIAGNOSIS — Z8739 Personal history of other diseases of the musculoskeletal system and connective tissue: Secondary | ICD-10-CM | POA: Insufficient documentation

## 2016-02-05 NOTE — Progress Notes (Signed)
Gastroenterology Consultation  Referring Provider:     Einar Pheasant, MD Primary Care Physician:  Einar Pheasant, MD Primary Gastroenterologist:  Dr. Allen Norris     Reason for Consultation:     Dysphagia         HPI:   Rachael Austin is a 75 y.o. y/o female referred for consultation & management of Dysphagia by Dr. Einar Pheasant, MD.   This patient comes in today with a report of problems swallowing pills. She also reports that she sometimes has problems with food but not with liquids. She states that the pills get stuck when she tries to take then and even with small pills. She feels like to get stuck in the back of the throat. There is no report of any unexplained weight loss, fevers, chills, nausea or vomiting. The patient does report that she was seen by ENT and Dr. Tami Ribas thought that because of her red vocal cords she should be evaluated by me for her dysphagia and sore throat. The patient denies any unexplained weight loss. She also denies ever having food stuck in her esophagus where she Have to bring it up.    Past Medical History  Diagnosis Date  . Degenerative disk disease   . GERD (gastroesophageal reflux disease)   . Interstitial cystitis     followed by Dr Jacqlyn Larsen  . Cancer ([place]) 1992    skin  . Hypercholesterolemia 2008  . Personal history of tobacco use, presenting hazards to health   . Breast screening, unspecified 2013  . Special screening for malignant neoplasms, colon   . Other sign and symptom in breast 2013    Left upper outer quadrant breast "soreness" Ultrasound exam of right breast in the 2 o'clock position with the breast distracted medially showed a 0.3-0.4 with 0.5 cm simple cyst. In the 1 o'clock position where pt reported tenderness US exam was negatiive.  Because of her history of intermittent nipple drainage, ultrasound was completed of the retroareolar area.    Past Surgical History  Procedure Laterality Date  . Cervical discectomy      S/P C7-T1  discectomy with fusion  . Appendectomy    . Cholecystectomy  1990  . Dilation and curettage of uterus    . Abdominal hysterectomy  1992  . Melanoma excision      removed from Left calf 1994    Prior to Admission medications   Medication Sig Start Date End Date Taking? Authorizing Provider  acetaminophen (TYLENOL) 325 MG tablet Take 650 mg by mouth as needed.   Yes Historical Provider, MD  aspirin EC 81 MG tablet Take 81 mg by mouth daily.   Yes Historical Provider, MD  ibuprofen (ADVIL,MOTRIN) 200 MG tablet Take 200 mg by mouth every 6 (six) hours as needed.   Yes Historical Provider, MD  imipramine (TOFRANIL) 25 MG tablet Take 50 mg by mouth at bedtime.  01/28/13  Yes Historical Provider, MD  lovastatin (MEVACOR) 20 MG tablet Take 1 tablet (20 mg total) by mouth at bedtime. 01/23/16  Yes Einar Pheasant, MD  Multiple Vitamin (MULTI-VITAMIN DAILY PO) Take 1 tablet by mouth daily.   Yes Historical Provider, MD  omeprazole (PRILOSEC) 20 MG capsule TAKE ONE (1) CAPSULE BY MOUTH 2 TIMES DAILY 02/01/16  Yes Einar Pheasant, MD  sertraline (ZOLOFT) 50 MG tablet TAKE ONE (1) TABLET BY MOUTH EVERY DAY Patient not taking: Reported on 02/13/2015 02/06/15   Einar Pheasant, MD    Family History  Problem Relation  Age of Onset  . Hodgkin's lymphoma Mother   . Heart failure Father   . Heart attack Father   . Arthritis Sister     Three sisters w/ degeneratve disk disease  . Headache Sister     Two sisters hx of headache  . Breast cancer Neg Hx   . Colon cancer Neg Hx      Social History  Substance Use Topics  . Smoking status: Former Smoker -- 1.00 packs/day for 15 years    Types: Cigarettes  . Smokeless tobacco: Never Used  . Alcohol Use: No    Allergies as of 02/05/2016 - Review Complete 02/05/2016  Allergen Reaction Noted  . Augmentin [amoxicillin-pot clavulanate] Swelling and Rash 12/21/2009  . Codeine sulfate Other (See Comments) 05/03/1999    Review of Systems:    All systems reviewed  and negative except where noted in HPI.   Physical Exam:  BP 154/71 mmHg  Pulse 94  Temp(Src) 97.9 F (36.6 C) (Oral)  Ht 5' 4.5" (1.638 m)  Wt 154 lb (69.854 kg)  BMI 26.04 kg/m2  LMP 08/09/2012 Patient's last menstrual period was 08/09/2012. Psych:  Alert and cooperative. Normal mood and affect. General:   Alert,  Well-developed, well-nourished, pleasant and cooperative in NAD Head:  Normocephalic and atraumatic. Eyes:  Sclera clear, no icterus.   Conjunctiva pink. Ears:  Normal auditory acuity. Nose:  No deformity, discharge, or lesions. Mouth:  No deformity or lesions,oropharynx pink & moist. Neck:  Supple; no masses or thyromegaly. Lungs:  Respirations even and unlabored.  Clear throughout to auscultation.   No wheezes, crackles, or rhonchi. No acute distress. Heart:  Regular rate and rhythm; no murmurs, clicks, rubs, or gallops. Abdomen:  Normal bowel sounds.  No bruits.  Soft, non-tender and non-distended without masses, hepatosplenomegaly or hernias noted.  No guarding or rebound tenderness.  Negative Carnett sign.   Rectal:  Deferred.  Msk:  Symmetrical without gross deformities.  Good, equal movement & strength bilaterally. Pulses:  Normal pulses noted. Extremities:  No clubbing or edema.  No cyanosis. Neurologic:  Alert and oriented x3;  grossly normal neurologically. Skin:  Intact without significant lesions or rashes.  No jaundice. Lymph Nodes:  No significant cervical adenopathy. Psych:  Alert and cooperative. Normal mood and affect.  Imaging Studies: No results found.  Assessment and Plan:   CAEDENCE Austin is a 75 y.o. y/o female  Who comes in today with a history of dysphagia and a sore throat. The patient was suggested by Dr. Tami Ribas from ENT to be evaluated by gastroenterology. The patient will be set up for an upper endoscopy due to her dysphagia and possible reflux disease. The patient also reports that she has not had a colonoscopy 12 years and is due to  have one done.I have discussed risks & benefits which include, but are not limited to, bleeding, infection, perforation & drug reaction.  The patient agrees with this plan & written consent will be obtained.      Note: This dictation was prepared with Dragon dictation along with smaller phrase technology. Any transcriptional errors that result from this process are unintentional.

## 2016-02-06 ENCOUNTER — Other Ambulatory Visit: Payer: Self-pay

## 2016-02-06 MED ORDER — PEG 3350-KCL-NABCB-NACL-NASULF 236 G PO SOLR: 4000.0000 mL | Freq: Once | ORAL | Status: DC

## 2016-02-08 ENCOUNTER — Telehealth: Payer: Self-pay | Admitting: Gastroenterology

## 2016-02-08 NOTE — Telephone Encounter (Signed)
Patient called and stated that she is having a colonoscopy and EGD next week and she wanted to let Dr. Allen Norris know that she is having some constipation and at times blood on the toilet paper. She thinks she might have internal hemorroids. Please call.

## 2016-02-12 NOTE — Telephone Encounter (Signed)
Tried returning pt's call but phone was busy. Will try again.

## 2016-02-13 NOTE — Discharge Instructions (Signed)

## 2016-02-13 NOTE — Telephone Encounter (Signed)
Spoke with pt and she is no longer having blood on her toilet paper. Pt is scheduled for her procedure tomorrow. 02/14/16.

## 2016-02-14 ENCOUNTER — Encounter
Admission: RE | Disposition: A | Discharge status: Home / Self Care | Payer: Self-pay | Source: Ambulatory Visit | Attending: Gastroenterology

## 2016-02-14 ENCOUNTER — Ambulatory Visit
Admission: RE | Admit: 2016-02-14 | Discharge: 2016-02-14 | Disposition: A | Discharge status: Home / Self Care | Payer: Medicare Other | Source: Ambulatory Visit | Attending: Gastroenterology | Admitting: Gastroenterology

## 2016-02-14 ENCOUNTER — Ambulatory Visit: Payer: Medicare Other | Admitting: Anesthesiology

## 2016-02-14 ENCOUNTER — Encounter: Payer: Self-pay | Admitting: *Deleted

## 2016-02-14 DIAGNOSIS — Z885 Allergy status to narcotic agent status: Secondary | ICD-10-CM | POA: Insufficient documentation

## 2016-02-14 DIAGNOSIS — K219 Gastro-esophageal reflux disease without esophagitis: Secondary | ICD-10-CM | POA: Diagnosis not present

## 2016-02-14 DIAGNOSIS — Z8249 Family history of ischemic heart disease and other diseases of the circulatory system: Secondary | ICD-10-CM | POA: Insufficient documentation

## 2016-02-14 DIAGNOSIS — C8190 Hodgkin lymphoma, unspecified, unspecified site: Secondary | ICD-10-CM | POA: Insufficient documentation

## 2016-02-14 DIAGNOSIS — Z1211 Encounter for screening for malignant neoplasm of colon: Secondary | ICD-10-CM | POA: Diagnosis not present

## 2016-02-14 DIAGNOSIS — K449 Diaphragmatic hernia without obstruction or gangrene: Secondary | ICD-10-CM | POA: Insufficient documentation

## 2016-02-14 DIAGNOSIS — Z881 Allergy status to other antibiotic agents status: Secondary | ICD-10-CM | POA: Diagnosis not present

## 2016-02-14 DIAGNOSIS — K641 Second degree hemorrhoids: Secondary | ICD-10-CM | POA: Insufficient documentation

## 2016-02-14 DIAGNOSIS — N3010 Interstitial cystitis (chronic) without hematuria: Secondary | ICD-10-CM | POA: Diagnosis not present

## 2016-02-14 DIAGNOSIS — Z85820 Personal history of malignant melanoma of skin: Secondary | ICD-10-CM | POA: Insufficient documentation

## 2016-02-14 DIAGNOSIS — Z79899 Other long term (current) drug therapy: Secondary | ICD-10-CM | POA: Diagnosis not present

## 2016-02-14 DIAGNOSIS — R1310 Dysphagia, unspecified: Secondary | ICD-10-CM | POA: Insufficient documentation

## 2016-02-14 DIAGNOSIS — Z7982 Long term (current) use of aspirin: Secondary | ICD-10-CM | POA: Insufficient documentation

## 2016-02-14 DIAGNOSIS — Z87891 Personal history of nicotine dependence: Secondary | ICD-10-CM | POA: Diagnosis not present

## 2016-02-14 DIAGNOSIS — Z8261 Family history of arthritis: Secondary | ICD-10-CM | POA: Diagnosis not present

## 2016-02-14 DIAGNOSIS — K5730 Diverticulosis of large intestine without perforation or abscess without bleeding: Secondary | ICD-10-CM | POA: Insufficient documentation

## 2016-02-14 DIAGNOSIS — E7800 Pure hypercholesterolemia, unspecified: Secondary | ICD-10-CM | POA: Insufficient documentation

## 2016-02-14 HISTORY — DX: Other specified postprocedural states: Z98.890

## 2016-02-14 HISTORY — PX: ESOPHAGOGASTRODUODENOSCOPY (EGD) WITH PROPOFOL: SHX5813

## 2016-02-14 HISTORY — DX: Nausea with vomiting, unspecified: R11.2

## 2016-02-14 HISTORY — PX: COLONOSCOPY WITH PROPOFOL: SHX5780

## 2016-02-14 SURGERY — COLONOSCOPY WITH PROPOFOL
Anesthesia: Monitor Anesthesia Care | Wound class: Contaminated

## 2016-02-14 MED ORDER — LIDOCAINE HCL (CARDIAC) 20 MG/ML IV SOLN
INTRAVENOUS | Status: DC | PRN
  Administered 2016-02-14: 40 mg via INTRAVENOUS

## 2016-02-14 MED ORDER — PROPOFOL 10 MG/ML IV BOLUS
INTRAVENOUS | Status: DC | PRN
  Administered 2016-02-14 (×2): 20 mg via INTRAVENOUS
  Administered 2016-02-14: 50 mg via INTRAVENOUS
  Administered 2016-02-14 (×2): 30 mg via INTRAVENOUS
  Administered 2016-02-14 (×5): 20 mg via INTRAVENOUS
  Administered 2016-02-14 (×2): 30 mg via INTRAVENOUS
  Administered 2016-02-14 (×4): 20 mg via INTRAVENOUS
  Administered 2016-02-14 (×2): 30 mg via INTRAVENOUS

## 2016-02-14 MED ORDER — ONDANSETRON HCL 4 MG/2ML IJ SOLN: 4.0000 mg | Freq: Once | INTRAMUSCULAR | Status: DC | PRN

## 2016-02-14 MED ORDER — LACTATED RINGERS IV SOLN
INTRAVENOUS | Status: DC
  Administered 2016-02-14 (×2): via INTRAVENOUS

## 2016-02-14 MED ORDER — STERILE WATER FOR IRRIGATION IR SOLN
Status: DC | PRN
  Administered 2016-02-14: 10:00:00

## 2016-02-14 MED ORDER — GLYCOPYRROLATE 0.2 MG/ML IJ SOLN
INTRAMUSCULAR | Status: DC | PRN
  Administered 2016-02-14: 0.2 mg via INTRAVENOUS

## 2016-02-14 SURGICAL SUPPLY — 33 items
BALLN DILATOR 10-12 8 (BALLOONS)
BALLN DILATOR 12-15 8 (BALLOONS)
BALLN DILATOR 15-18 8 (BALLOONS)
BALLN DILATOR CRE 0-12 8 (BALLOONS)
BALLN DILATOR ESOPH 8 10 CRE (MISCELLANEOUS) IMPLANT
BALLOON DILATOR 12-15 8 (BALLOONS) IMPLANT
BALLOON DILATOR 15-18 8 (BALLOONS) IMPLANT
BALLOON DILATOR CRE 0-12 8 (BALLOONS) IMPLANT
BLOCK BITE 60FR ADLT L/F GRN (MISCELLANEOUS) ×2 IMPLANT
CANISTER SUCT 1200ML W/VALVE (MISCELLANEOUS) ×2 IMPLANT
CLIP HMST 235XBRD CATH ROT (MISCELLANEOUS) IMPLANT
CLIP RESOLUTION 360 11X235 (MISCELLANEOUS)
FCP ESCP3.2XJMB 240X2.8X (MISCELLANEOUS)
FORCEPS BIOP RAD 4 LRG CAP 4 (CUTTING FORCEPS) IMPLANT
FORCEPS BIOP RJ4 240 W/NDL (MISCELLANEOUS)
FORCEPS ESCP3.2XJMB 240X2.8X (MISCELLANEOUS) IMPLANT
GOWN CVR UNV OPN BCK APRN NK (MISCELLANEOUS) ×2 IMPLANT
GOWN ISOL THUMB LOOP REG UNIV (MISCELLANEOUS) ×2
INJECTOR VARIJECT VIN23 (MISCELLANEOUS) IMPLANT
KIT DEFENDO VALVE AND CONN (KITS) IMPLANT
KIT ENDO PROCEDURE OLY (KITS) ×2 IMPLANT
MARKER SPOT ENDO TATTOO 5ML (MISCELLANEOUS) IMPLANT
PAD GROUND ADULT SPLIT (MISCELLANEOUS) IMPLANT
PROBE APC STR FIRE (PROBE) IMPLANT
SNARE SHORT THROW 13M SML OVAL (MISCELLANEOUS) IMPLANT
SNARE SHORT THROW 30M LRG OVAL (MISCELLANEOUS) IMPLANT
SNARE SNG USE RND 15MM (INSTRUMENTS) IMPLANT
SPOT EX ENDOSCOPIC TATTOO (MISCELLANEOUS)
SYR INFLATION 60ML (SYRINGE) IMPLANT
TRAP ETRAP POLY (MISCELLANEOUS) IMPLANT
VARIJECT INJECTOR VIN23 (MISCELLANEOUS)
WATER STERILE IRR 250ML POUR (IV SOLUTION) ×2 IMPLANT
WIRE CRE 18-20MM 8CM F G (MISCELLANEOUS) IMPLANT

## 2016-02-14 NOTE — Anesthesia Postprocedure Evaluation (Signed)
Anesthesia Post Note  Patient: Rachael Austin  Procedure(s) Performed: Procedure(s) (LRB): COLONOSCOPY WITH PROPOFOL (N/A) ESOPHAGOGASTRODUODENOSCOPY (EGD) WITH PROPOFOL with dialtion (N/A)  Patient location during evaluation: PACU Anesthesia Type: MAC Level of consciousness: awake and alert Pain management: pain level controlled Vital Signs Assessment: post-procedure vital signs reviewed and stable Respiratory status: spontaneous breathing, nonlabored ventilation, respiratory function stable and patient connected to nasal cannula oxygen Cardiovascular status: stable and blood pressure returned to baseline Anesthetic complications: no    Amaryllis Dyke

## 2016-02-14 NOTE — Op Note (Signed)
[facility] Patient Name: Rachael Austin Procedure Date: 02/14/2016 10:12 AM MRN: [id number] Account #: [id number] Date of Birth: 02/13/1941 Admit Type: Outpatient Age: 75 Room: [facility] OR ROOM 01 Gender: Female Note Status: Finalized Procedure:            Upper GI endoscopy Indications:          Dysphagia Providers:            Lucilla Lame, MD Referring MD:         Einar Pheasant, MD (Referring MD) Medicines:            Propofol per Anesthesia Complications:        No immediate complications. Procedure:            Pre-Anesthesia Assessment:                       - Prior to the procedure, a History and Physical was                        performed, and patient medications and allergies were                        reviewed. The patient's tolerance of previous                        anesthesia was also reviewed. The risks and benefits of                        the procedure and the sedation options and risks were                        discussed with the patient. All questions were                        answered, and informed consent was obtained. Prior                        Anticoagulants: The patient has taken no previous                        anticoagulant or antiplatelet agents. ASA Grade                        Assessment: II - A patient with mild systemic disease.                        After reviewing the risks and benefits, the patient was                        deemed in satisfactory condition to undergo the                        procedure.                       After obtaining informed consent, the endoscope was                        passed under direct vision. Throughout the procedure,  the patient's blood pressure, pulse, and oxygen                        saturations were monitored continuously. The Olympus                        GIF H180J endoscope (S#: [id number]) was introduced                        through the  mouth, and advanced to the second part of                        duodenum. The upper GI endoscopy was accomplished                        without difficulty. The patient tolerated the procedure                        well. Findings:      A small hiatal hernia was present.      The stomach was normal.      The examined duodenum was normal.      The scope was withdrawn. Dilation was performed in the entire esophagus       with a Maloney dilator with no resistance at 52 Fr. Impression:           - Small hiatal hernia.                       - Normal stomach.                       - Normal examined duodenum.                       - Dilation performed in the entire esophagus.                       - No specimens collected. Recommendation:       - Perform a colonoscopy today. Procedure Code(s):    --- Professional ---                       [phone], Esophagogastroduodenoscopy, flexible, transoral;                        diagnostic, including collection of specimen(s) by                        brushing or washing, when performed (separate procedure)                       43450, Dilation of esophagus, by unguided sound or                        bougie, single or multiple passes Diagnosis Code(s):    --- Professional ---                       R13.10, Dysphagia, unspecified                       K44.9, Diaphragmatic hernia without obstruction or  gangrene CPT copyright 2016 American Medical Association. All rights reserved. The codes documented in this report are preliminary and upon coder review may  be revised to meet current compliance requirements. Lucilla Lame, MD 02/14/2016 10:26:57 AM This report has been signed electronically. Number of Addenda: 0 Note Initiated On: 02/14/2016 10:12 AM Total Procedure Duration: 0 hours 4 minutes 7 seconds       [facility]

## 2016-02-14 NOTE — H&P (Signed)
[facility]  [address], [place], [place] [address] Phone: [phone] Fax : [phone]  Primary Care Physician:  Einar Pheasant, MD Primary Gastroenterologist:  Dr. Allen Norris  Pre-Procedure History & Physical: HPI:  Rachael Austin is a 75 y.o. female is here for an endoscopy and colonoscopy.   Past Medical History  Diagnosis Date  . Degenerative disk disease   . GERD (gastroesophageal reflux disease)   . Interstitial cystitis     followed by Dr Jacqlyn Larsen  . Cancer ([place]) 1992    skin  . Hypercholesterolemia 2008  . Personal history of tobacco use, presenting hazards to health   . Breast screening, unspecified 2013  . Special screening for malignant neoplasms, colon   . Other sign and symptom in breast 2013    Left upper outer quadrant breast "soreness" Ultrasound exam of right breast in the 2 o'clock position with the breast distracted medially showed a 0.3-0.4 with 0.5 cm simple cyst. In the 1 o'clock position where pt reported tenderness US exam was negatiive.  Because of her history of intermittent nipple drainage, ultrasound was completed of the retroareolar area.  Marland Kitchen PONV (postoperative nausea and vomiting)     Past Surgical History  Procedure Laterality Date  . Cervical discectomy      S/P C7-T1 discectomy with fusion  . Appendectomy    . Cholecystectomy  1990  . Dilation and curettage of uterus    . Abdominal hysterectomy  1992  . Melanoma excision      removed from Left calf 1994    Prior to Admission medications   Medication Sig Start Date End Date Taking? Authorizing Provider  acetaminophen (TYLENOL) 325 MG tablet Take 650 mg by mouth as needed.   Yes Historical Provider, MD  aspirin EC 81 MG tablet Take 81 mg by mouth daily.   Yes Historical Provider, MD  ibuprofen (ADVIL,MOTRIN) 200 MG tablet Take 200 mg by mouth every 6 (six) hours as needed.   Yes Historical Provider, MD  lovastatin (MEVACOR) 20 MG tablet Take 1 tablet (20 mg total) by  mouth at bedtime. 01/23/16  Yes Einar Pheasant, MD  Multiple Vitamin (MULTI-VITAMIN DAILY PO) Take 1 tablet by mouth daily.   Yes Historical Provider, MD  omeprazole (PRILOSEC) 20 MG capsule TAKE ONE (1) CAPSULE BY MOUTH 2 TIMES DAILY 02/01/16  Yes Einar Pheasant, MD  polyethylene glycol (GOLYTELY) 236 g solution Take 4,000 mLs by mouth once. Drink one 8 oz glass every 20 mins until stools are clear 02/06/16  Yes Lucilla Lame, MD  imipramine (TOFRANIL) 25 MG tablet Take 50 mg by mouth at bedtime. Reported on 02/08/2016 01/28/13   Historical Provider, MD  sertraline (ZOLOFT) 50 MG tablet TAKE ONE (1) TABLET BY MOUTH EVERY DAY Patient not taking: Reported on 02/13/2015 02/06/15   Einar Pheasant, MD    Allergies as of 02/06/2016 - Review Complete 02/05/2016  Allergen Reaction Noted  . Augmentin [amoxicillin-pot clavulanate] Swelling and Rash 12/21/2009  . Codeine sulfate Other (See Comments) 05/03/1999    Family History  Problem Relation Age of Onset  . Hodgkin's lymphoma Mother   . Heart failure Father   . Heart attack Father   . Arthritis Sister     Three sisters w/ degeneratve disk disease  . Headache Sister     Two sisters hx of headache  . Breast cancer Neg Hx   . Colon cancer Neg Hx     Social History   Social History  . Marital Status:  Widowed    Spouse Name: N/A  . Number of Children: 2  . Years of Education: N/A   Occupational History  . Not on file.   Social History Main Topics  . Smoking status: Former Smoker -- 1.00 packs/day for 15 years    Types: Cigarettes  . Smokeless tobacco: Never Used  . Alcohol Use: No  . Drug Use: No  . Sexual Activity: Not on file   Other Topics Concern  . Not on file   Social History Narrative   Married and has 2 children, daughters.    Review of Systems: See HPI, otherwise negative ROS  Physical Exam: Ht 5\' 4"  (1.626 m)  Wt 153 lb (69.4 kg)  BMI 26.25 kg/m2  LMP 08/09/2012 General:   Alert,  pleasant and cooperative in  NAD Head:  Normocephalic and atraumatic. Neck:  Supple; no masses or thyromegaly. Lungs:  Clear throughout to auscultation.    Heart:  Regular rate and rhythm. Abdomen:  Soft, nontender and nondistended. Normal bowel sounds, without guarding, and without rebound.   Neurologic:  Alert and  oriented x4;  grossly normal neurologically.  Impression/Plan: [name] is here for an endoscopy and colonoscopy to be performed for screening and dysphagia  Risks, benefits, limitations, and alternatives regarding  endoscopy and colonoscopy have been reviewed with the patient.  Questions have been answered.  All parties agreeable.   Lucilla Lame, MD  02/14/2016, 9:05 AM

## 2016-02-14 NOTE — Anesthesia Procedure Notes (Signed)
Procedure Name: MAC Performed by: ,  Pre-anesthesia Checklist: Patient identified, Emergency Drugs available, Suction available, Timeout performed and Patient being monitored Patient Re-evaluated:Patient Re-evaluated prior to inductionOxygen Delivery Method: Nasal cannula Placement Confirmation: positive ETCO2       

## 2016-02-14 NOTE — Transfer of Care (Signed)
Immediate Anesthesia Transfer of Care Note  Patient: Rachael Austin  Procedure(s) Performed: Procedure(s) with comments: COLONOSCOPY WITH PROPOFOL (N/A) - PT WOULD LIKE 10 ARRIVAL TIME OR LATER ESOPHAGOGASTRODUODENOSCOPY (EGD) WITH PROPOFOL with dialtion (N/A)  Patient Location: PACU  Anesthesia Type: MAC  Level of Consciousness: awake, alert  and patient cooperative  Airway and Oxygen Therapy: Patient Spontanous Breathing and Patient connected to supplemental oxygen  Post-op Assessment: Post-op Vital signs reviewed, Patient's Cardiovascular Status Stable, Respiratory Function Stable, Patent Airway and No signs of Nausea or vomiting  Post-op Vital Signs: Reviewed and stable  Complications: No apparent anesthesia complications

## 2016-02-14 NOTE — Anesthesia Preprocedure Evaluation (Signed)
Anesthesia Evaluation  Patient identified by MRN, date of birth, ID band Patient awake    Reviewed: Allergy & Precautions, H&P , NPO status   History of Anesthesia Complications (+) PONV and history of anesthetic complications  Airway Mallampati: II  TM Distance: >3 FB Neck ROM: full    Dental   Pulmonary former smoker,    breath sounds clear to auscultation       Cardiovascular  Rhythm:regular Rate:Normal     Neuro/Psych    GI/Hepatic GERD  ,  Endo/Other    Renal/GU      Musculoskeletal   Abdominal   Peds  Hematology   Anesthesia Other Findings   Reproductive/Obstetrics                             Anesthesia Physical Anesthesia Plan  ASA: II  Anesthesia Plan: MAC   Post-op Pain Management:    Induction:   Airway Management Planned:   Additional Equipment:   Intra-op Plan:   Post-operative Plan:   Informed Consent: I have reviewed the patients History and Physical, chart, labs and discussed the procedure including the risks, benefits and alternatives for the proposed anesthesia with the patient or authorized representative who has indicated his/her understanding and acceptance.     Plan Discussed with: CRNA  Anesthesia Plan Comments:         Anesthesia Quick Evaluation

## 2016-02-14 NOTE — Op Note (Signed)
[facility] Patient Name: Rachael Austin Procedure Date: 02/14/2016 10:11 AM MRN: [id number] Account #: [id number] Date of Birth: October 03, 1940 Admit Type: Outpatient Age: 75 Room: [facility] OR ROOM 01 Gender: Female Note Status: Finalized Procedure:            Colonoscopy Indications:          Screening for colorectal malignant neoplasm Providers:            Lucilla Lame, MD Medicines:            Propofol per Anesthesia Complications:        No immediate complications. Procedure:            Pre-Anesthesia Assessment:                       - Prior to the procedure, a History and Physical was                        performed, and patient medications and allergies were                        reviewed. The patient's tolerance of previous                        anesthesia was also reviewed. The risks and benefits of                        the procedure and the sedation options and risks were                        discussed with the patient. All questions were                        answered, and informed consent was obtained. Prior                        Anticoagulants: The patient has taken no previous                        anticoagulant or antiplatelet agents. ASA Grade                        Assessment: II - A patient with mild systemic disease.                        After reviewing the risks and benefits, the patient was                        deemed in satisfactory condition to undergo the                        procedure.                       After obtaining informed consent, the colonoscope was                        passed under direct vision. Throughout the procedure,                        the patient's blood pressure, pulse,  and oxygen                        saturations were monitored continuously. The Olympus CF                        H180AL colonoscope (S#: [id number]) was introduced through                        the anus and advanced to the  the cecum, identified by                        appendiceal orifice and ileocecal valve. The                        colonoscopy was performed without difficulty. The                        patient tolerated the procedure well. The quality of                        the bowel preparation was excellent. Findings:      The perianal and digital rectal examinations were normal.      Multiple small-mouthed diverticula were found in the sigmoid colon.      Non-bleeding internal hemorrhoids were found during retroflexion. The       hemorrhoids were Grade II (internal hemorrhoids that prolapse but reduce       spontaneously). Impression:           - Diverticulosis in the sigmoid colon.                       - Non-bleeding internal hemorrhoids.                       - No specimens collected. Recommendation:       - Resume previous diet.                       - Continue present medications. Procedure Code(s):    --- Professional ---                       [phone], Colonoscopy, flexible; diagnostic, including                        collection of specimen(s) by brushing or washing, when                        performed (separate procedure) Diagnosis Code(s):    --- Professional ---                       Z12.11, Encounter for screening for malignant neoplasm                        of colon CPT copyright 2016 American Medical Association. All rights reserved. The codes documented in this report are preliminary and upon coder review may  be revised to meet current compliance requirements. Lucilla Lame, MD 02/14/2016 10:49:21 AM This report has been signed electronically. Number of Addenda: 0 Note Initiated On: 02/14/2016 10:11 AM Scope Withdrawal Time: 0 hours 7 minutes 6 seconds  Total  Procedure Duration: 0 hours 12 minutes 36 seconds       [facility]

## 2016-02-15 ENCOUNTER — Encounter: Payer: Self-pay | Admitting: Gastroenterology

## 2016-04-08 ENCOUNTER — Encounter: Payer: Self-pay | Admitting: Internal Medicine

## 2016-04-08 ENCOUNTER — Ambulatory Visit (INDEPENDENT_AMBULATORY_CARE_PROVIDER_SITE_OTHER): Payer: Medicare Other | Admitting: Internal Medicine

## 2016-04-08 VITALS — BP 128/70 | HR 83 | Temp 97.9°F | Resp 18 | Ht 63.25 in | Wt 151.2 lb

## 2016-04-08 DIAGNOSIS — N3010 Interstitial cystitis (chronic) without hematuria: Secondary | ICD-10-CM | POA: Diagnosis not present

## 2016-04-08 DIAGNOSIS — Z0000 Encounter for general adult medical examination without abnormal findings: Secondary | ICD-10-CM | POA: Diagnosis not present

## 2016-04-08 DIAGNOSIS — Z658 Other specified problems related to psychosocial circumstances: Secondary | ICD-10-CM

## 2016-04-08 DIAGNOSIS — K219 Gastro-esophageal reflux disease without esophagitis: Secondary | ICD-10-CM | POA: Diagnosis not present

## 2016-04-08 DIAGNOSIS — E7800 Pure hypercholesterolemia, unspecified: Secondary | ICD-10-CM

## 2016-04-08 DIAGNOSIS — R739 Hyperglycemia, unspecified: Secondary | ICD-10-CM

## 2016-04-08 DIAGNOSIS — F439 Reaction to severe stress, unspecified: Secondary | ICD-10-CM

## 2016-04-08 MED ORDER — FLUOXETINE HCL 10 MG PO CAPS: 10.0000 mg | ORAL_CAPSULE | Freq: Every day | ORAL | Status: DC

## 2016-04-08 MED ORDER — BUSPIRONE HCL 5 MG PO TABS: 5.0000 mg | ORAL_TABLET | Freq: Every day | ORAL | Status: DC

## 2016-04-08 MED ORDER — LOVASTATIN 20 MG PO TABS: 20.0000 mg | ORAL_TABLET | Freq: Every day | ORAL | Status: DC

## 2016-04-08 NOTE — Progress Notes (Signed)
[name], female   DOB: 04-27-1941, 75 y.o.   MRN: [id number]   Subjective:    [name], female    DOB: 07/11/1941, 75 y.o.   MRN: [id number]  HPI  Patient here for her physical exam.  She has been under increased stress recently.  Her husband recently passed.  She has good support.  Does not feel needs anything more at this time.  Tries to stay active.  No chest pain.  Breathing stable.  No abdominal pain or cramping.  Bowels stable.     Past Medical History  Diagnosis Date  . Degenerative disk disease   . GERD (gastroesophageal reflux disease)   . Interstitial cystitis     followed by Dr Jacqlyn Larsen  . Cancer ([place]) 1992    skin  . Hypercholesterolemia 2008  . Personal history of tobacco use, presenting hazards to health   . Breast screening, unspecified 2013  . Special screening for malignant neoplasms, colon   . Other sign and symptom in breast 2013    Left upper outer quadrant breast "soreness" Ultrasound exam of right breast in the 2 o'clock position with the breast distracted medially showed a 0.3-0.4 with 0.5 cm simple cyst. In the 1 o'clock position where pt reported tenderness US exam was negatiive.  Because of her history of intermittent nipple drainage, ultrasound was completed of the retroareolar area.  Marland Kitchen PONV (postoperative nausea and vomiting)    Past Surgical History  Procedure Laterality Date  . Cervical discectomy      S/P C7-T1 discectomy with fusion  . Appendectomy    . Cholecystectomy  1990  . Dilation and curettage of uterus    . Abdominal hysterectomy  1992  . Melanoma excision      removed from Left calf 1994  . Colonoscopy with propofol N/A 02/14/2016    Procedure: COLONOSCOPY WITH PROPOFOL;  Surgeon: Lucilla Lame, MD;  Location: [facility];  Service: Endoscopy;  Laterality: N/A;  PT WOULD LIKE 10 ARRIVAL TIME OR LATER  . Esophagogastroduodenoscopy (egd) with propofol N/A 02/14/2016    Procedure:  ESOPHAGOGASTRODUODENOSCOPY (EGD) WITH PROPOFOL with dialtion;  Surgeon: Lucilla Lame, MD;  Location: [facility];  Service: Endoscopy;  Laterality: N/A;   Family History  Problem Relation Age of Onset  . Hodgkin's lymphoma Mother   . Heart failure Father   . Heart attack Father   . Arthritis Sister     Three sisters w/ degeneratve disk disease  . Headache Sister     Two sisters hx of headache  . Breast cancer Neg Hx   . Colon cancer Neg Hx    Social History   Social History  . Marital Status: Widowed    Spouse Name: N/A  . Number of Children: 2  . Years of Education: N/A   Social History Main Topics  . Smoking status: Former Smoker -- 1.00 packs/day for 15 years    Types: Cigarettes  . Smokeless tobacco: Never Used  . Alcohol Use: No  . Drug Use: No  . Sexual Activity: Not Asked   Other Topics Concern  . None   Social History Narrative   Married and has 2 children, daughters.    Outpatient Encounter Prescriptions as of 04/08/2016  Medication Sig  . acetaminophen (TYLENOL) 325 MG tablet Take 650 mg by mouth as needed.  Marland Kitchen aspirin EC 81 MG tablet Take 81 mg by mouth daily.  Marland Kitchen ibuprofen (ADVIL,MOTRIN) 200 MG tablet Take 200  mg by mouth every 6 (six) hours as needed.  Marland Kitchen imipramine (TOFRANIL) 25 MG tablet Take 50 mg by mouth at bedtime. Reported on 02/08/2016  . lovastatin (MEVACOR) 20 MG tablet Take 1 tablet (20 mg total) by mouth at bedtime.  . Multiple Vitamin (MULTI-VITAMIN DAILY PO) Take 1 tablet by mouth daily.  Marland Kitchen omeprazole (PRILOSEC) 20 MG capsule TAKE ONE (1) CAPSULE BY MOUTH 2 TIMES DAILY  . polyethylene glycol (GOLYTELY) 236 g solution Take 4,000 mLs by mouth once. Drink one 8 oz glass every 20 mins until stools are clear  . sertraline (ZOLOFT) 50 MG tablet TAKE ONE (1) TABLET BY MOUTH EVERY DAY  . [DISCONTINUED] lovastatin (MEVACOR) 20 MG tablet Take 1 tablet (20 mg total) by mouth at bedtime.  . busPIRone (BUSPAR) 5 MG tablet Take 1 tablet (5 mg total) by  mouth at bedtime.  . [DISCONTINUED] FLUoxetine (PROZAC) 10 MG capsule Take 1 capsule (10 mg total) by mouth daily.   No facility-administered encounter medications on file as of 04/08/2016.    Review of Systems  Constitutional: Negative for appetite change and unexpected weight change.  HENT: Negative for congestion and sinus pressure.   Eyes: Negative for pain and visual disturbance.  Respiratory: Negative for cough, chest tightness and shortness of breath.   Cardiovascular: Negative for chest pain, palpitations and leg swelling.  Gastrointestinal: Negative for nausea, vomiting, abdominal pain and diarrhea.  Genitourinary: Negative for frequency and difficulty urinating.  Musculoskeletal: Negative for back pain and joint swelling.  Skin: Negative for color change and rash.  Neurological: Negative for dizziness, light-headedness and headaches.  Hematological: Negative for adenopathy. Does not bruise/bleed easily.  Psychiatric/Behavioral: Negative for dysphoric mood and agitation.       Objective:    Physical Exam  Constitutional: She is oriented to person, place, and time. She appears well-developed and well-nourished. No distress.  HENT:  Nose: Nose normal.  Mouth/Throat: Oropharynx is clear and moist.  Eyes: Right eye exhibits no discharge. Left eye exhibits no discharge. No scleral icterus.  Neck: Neck supple. No thyromegaly present.  Cardiovascular: Normal rate and regular rhythm.   Pulmonary/Chest: Breath sounds normal. No accessory muscle usage. No tachypnea. No respiratory distress. She has no decreased breath sounds. She has no wheezes. She has no rhonchi. Right breast exhibits no inverted nipple, no mass, no nipple discharge and no tenderness (no axillary adenopathy). Left breast exhibits no inverted nipple, no mass, no nipple discharge and no tenderness (no axilarry adenopathy).  Abdominal: Soft. Bowel sounds are normal. There is no tenderness.  Musculoskeletal: She  exhibits no edema or tenderness.  Lymphadenopathy:    She has no cervical adenopathy.  Neurological: She is alert and oriented to person, place, and time.  Skin: Skin is warm. No rash noted. No erythema.  Psychiatric: She has a normal mood and affect. Her behavior is normal.    BP 128/70 mmHg  Pulse 83  Temp(Src) 97.9 F (36.6 C) (Oral)  Resp 18  Ht 5' 3.25" (1.607 m)  Wt 151 lb 4 oz (68.607 kg)  BMI 26.57 kg/m2  SpO2 98%  LMP 08/09/2012 Wt Readings from Last 3 Encounters:  04/08/16 151 lb 4 oz (68.607 kg)  02/14/16 152 lb (68.947 kg)  02/05/16 154 lb (69.854 kg)     Lab Results  Component Value Date   WBC 5.7 05/01/2015   HGB 13.7 05/01/2015   HCT 40.7 05/01/2015   PLT 230.0 05/01/2015   GLUCOSE 95 05/01/2015   CHOL 177 05/01/2015  TRIG 59.0 05/01/2015   HDL 76.10 05/01/2015   LDLCALC 89 05/01/2015   ALT 10 05/01/2015   AST 16 05/01/2015   NA 139 05/01/2015   K 4.2 05/01/2015   CL 102 05/01/2015   CREATININE 0.91 05/01/2015   BUN 9 05/01/2015   CO2 30 05/01/2015   TSH 3.855 09/18/2014   HGBA1C 5.8 09/15/2014       Assessment & Plan:   Problem List Items Addressed This Visit    Acid reflux    On omeprazole.        Chronic interstitial cystitis    Has been followed by Dr Jacqlyn Larsen.  Stable.        Health care maintenance    Physical today 04/08/16.  S/p hysterectomy.  Mammogram 07/04/15 - Birads I.  Colonoscopy 02/14/16 - diverticulosis and non bleeding internal hemorrhoids.       Hypercholesterolemia    On lovastatin.  Low cholesterol diet and exercise.  Follow lipid panel and liver function tests.        Relevant Medications   lovastatin (MEVACOR) 20 MG tablet   Other Relevant Orders   Lipid panel   Hepatic function panel   Stress    Increased stress as outlined.  Has good support.  Does not feel needs anything more at this point.  Follow.       Relevant Orders   CBC with Differential/Platelet   TSH    Other Visit Diagnoses    Routine  general medical examination at a health care facility    -  Primary    Hyperglycemia        Relevant Orders    Hemoglobin [id number]    Basic metabolic panel        Einar Pheasant, MD

## 2016-04-08 NOTE — Progress Notes (Signed)
Pre-visit discussion using our clinic review tool. No additional management support is needed unless otherwise documented below in the visit note.  

## 2016-04-14 ENCOUNTER — Encounter: Payer: Self-pay | Admitting: Internal Medicine

## 2016-04-14 NOTE — Assessment & Plan Note (Signed)
On lovastatin.  Low cholesterol diet and exercise.  Follow lipid panel and liver function tests.   

## 2016-04-14 NOTE — Assessment & Plan Note (Signed)
Increased stress as outlined.  Has good support.  Does not feel needs anything more at this point.  Follow.

## 2016-04-14 NOTE — Assessment & Plan Note (Signed)
Has been followed by Dr Jacqlyn Larsen.  Stable.

## 2016-04-14 NOTE — Assessment & Plan Note (Signed)
On omeprazole.  

## 2016-04-14 NOTE — Assessment & Plan Note (Signed)
Physical today 04/08/16.  S/p hysterectomy.  Mammogram 07/04/15 - Birads I.  Colonoscopy 02/14/16 - diverticulosis and non bleeding internal hemorrhoids.

## 2016-04-17 ENCOUNTER — Other Ambulatory Visit: Payer: Medicare Other

## 2016-04-18 ENCOUNTER — Other Ambulatory Visit (INDEPENDENT_AMBULATORY_CARE_PROVIDER_SITE_OTHER): Payer: Medicare Other

## 2016-04-18 DIAGNOSIS — F439 Reaction to severe stress, unspecified: Secondary | ICD-10-CM

## 2016-04-18 DIAGNOSIS — Z658 Other specified problems related to psychosocial circumstances: Secondary | ICD-10-CM

## 2016-04-18 DIAGNOSIS — R739 Hyperglycemia, unspecified: Secondary | ICD-10-CM

## 2016-04-18 DIAGNOSIS — E7800 Pure hypercholesterolemia, unspecified: Secondary | ICD-10-CM

## 2016-04-18 LAB — CBC WITH DIFFERENTIAL/PLATELET
Basophils Absolute: 0 10*3/uL (ref 0.0–0.1)
Basophils Relative: 0.7 % (ref 0.0–3.0)
EOS PCT: 1 % (ref 0.0–5.0)
Eosinophils Absolute: 0 10*3/uL (ref 0.0–0.7)
HCT: 39 % (ref 36.0–46.0)
HEMOGLOBIN: 13.1 g/dL (ref 12.0–15.0)
Lymphocytes Relative: 27.7 % (ref 12.0–46.0)
Lymphs Abs: 1.3 10*3/uL (ref 0.7–4.0)
MCHC: 33.5 g/dL (ref 30.0–36.0)
MCV: 89 fl (ref 78.0–100.0)
MONOS PCT: 8.8 % (ref 3.0–12.0)
Monocytes Absolute: 0.4 10*3/uL (ref 0.1–1.0)
Neutro Abs: 3 10*3/uL (ref 1.4–7.7)
Neutrophils Relative %: 61.8 % (ref 43.0–77.0)
Platelets: 251 10*3/uL (ref 150.0–400.0)
RBC: 4.38 Mil/uL (ref 3.87–5.11)
RDW: 14.6 % (ref 11.5–15.5)
WBC: 4.8 10*3/uL (ref 4.0–10.5)

## 2016-04-18 LAB — HEPATIC FUNCTION PANEL
ALBUMIN: 4.4 g/dL (ref 3.5–5.2)
ALT: 10 U/L (ref 0–35)
AST: 15 U/L (ref 0–37)
Alkaline Phosphatase: 77 U/L (ref 39–117)
BILIRUBIN DIRECT: 0.2 mg/dL (ref 0.0–0.3)
TOTAL PROTEIN: 7.1 g/dL (ref 6.0–8.3)
Total Bilirubin: 0.7 mg/dL (ref 0.2–1.2)

## 2016-04-18 LAB — BASIC METABOLIC PANEL
BUN: 7 mg/dL (ref 6–23)
CHLORIDE: 102 meq/L (ref 96–112)
CO2: 30 mEq/L (ref 19–32)
Calcium: 9.6 mg/dL (ref 8.4–10.5)
Creatinine, Ser: 0.96 mg/dL (ref 0.40–1.20)
GFR: 60.02 mL/min (ref >60.00)
Glucose, Bld: 107 mg/dL — ABNORMAL HIGH (ref 70–99)
POTASSIUM: 4.6 meq/L (ref 3.5–5.1)
SODIUM: 137 meq/L (ref 135–145)

## 2016-04-18 LAB — TSH: TSH: 2.83 u[IU]/mL (ref 0.35–4.50)

## 2016-04-18 LAB — LIPID PANEL
CHOL/HDL RATIO: 2
Cholesterol: 178 mg/dL (ref 0–200)
HDL: 75.2 mg/dL (ref >39.00)
LDL CALC: 90 mg/dL (ref 0–99)
NONHDL: 102.38
TRIGLYCERIDES: 60 mg/dL (ref 0.0–149.0)
VLDL: 12 mg/dL (ref 0.0–40.0)

## 2016-04-18 LAB — HEMOGLOBIN A1C: Hgb A1c MFr Bld: 5.8 % (ref 4.6–6.5)

## 2016-04-21 ENCOUNTER — Encounter: Payer: Self-pay | Admitting: *Deleted

## 2016-05-13 ENCOUNTER — Telehealth: Payer: Self-pay | Admitting: Internal Medicine

## 2016-05-13 NOTE — Telephone Encounter (Signed)
Pt called about the Rx that was prescribed to her is not doing her any good. Medication is busPIRone (BUSPAR) 5 MG tablet. Please advise?   Call pt @ [phone] or home [phone] Thank you!

## 2016-05-13 NOTE — Telephone Encounter (Signed)
Spoke with the patient, patient is not having any relief with Buspar.  Per Patient nothing has changed. Please advise. thanks

## 2016-05-14 NOTE — Telephone Encounter (Signed)
Please advise for a appt, thanks

## 2016-05-14 NOTE — Telephone Encounter (Signed)
Spoke with the patient.   Buspar- taking one at night per the prescription.  No Zoloft  Biggest issues is her Anxiety.  States that she gets very Restless at night since her husband passed away and the smallest noises wake her up and she gets scared.  Anxiety gets worse.   Please advise.

## 2016-05-14 NOTE — Telephone Encounter (Signed)
If having persistent issues, can schedule an appt to discuss other treatment options.  If agreeable, let Caryl Pina know for work in appt.   I can get in with her and work her in.  Thanks.

## 2016-05-14 NOTE — Telephone Encounter (Signed)
How often is she taking the medication?  Confirm still taking zoloft.  What symptoms is she currently having and when does she feel she has the most trouble?

## 2016-05-15 NOTE — Telephone Encounter (Signed)
Lm on vm to schedule appt (soon)

## 2016-05-28 ENCOUNTER — Ambulatory Visit (INDEPENDENT_AMBULATORY_CARE_PROVIDER_SITE_OTHER): Payer: Medicare Other | Admitting: Internal Medicine

## 2016-05-28 ENCOUNTER — Encounter: Payer: Self-pay | Admitting: Internal Medicine

## 2016-05-28 DIAGNOSIS — Z658 Other specified problems related to psychosocial circumstances: Secondary | ICD-10-CM | POA: Diagnosis not present

## 2016-05-28 DIAGNOSIS — F439 Reaction to severe stress, unspecified: Secondary | ICD-10-CM

## 2016-05-28 MED ORDER — ALPRAZOLAM 0.25 MG PO TABS: 0.2500 mg | ORAL_TABLET | Freq: Every evening | ORAL | 0 refills | Status: DC | PRN

## 2016-05-28 NOTE — Progress Notes (Signed)
[name], female   DOB: 15-Jan-1941, 75 y.o.   MRN: [id number]   Subjective:    [name], female    DOB: 02-17-1941, 75 y.o.   MRN: [id number]  HPI  Patient here for a scheduled follow up.  She is still having issues with increased anxiety and sleeping.  Has been dealing with increased anxiety since her husband passed away.  She does not have issues during the day. Notices issues at night.  Hears noise.  Has tried buspar, sertraline and tylenol pm.  Eating and drinking well.  No nausea or vomiting.  Bowels stable.     Past Medical History:  Diagnosis Date  . Breast screening, unspecified 2013  . Cancer ([place]) 1992   skin  . Degenerative disk disease   . GERD (gastroesophageal reflux disease)   . Hypercholesterolemia 2008  . Interstitial cystitis    followed by Dr Jacqlyn Larsen  . Other sign and symptom in breast 2013   Left upper outer quadrant breast "soreness" Ultrasound exam of right breast in the 2 o'clock position with the breast distracted medially showed a 0.3-0.4 with 0.5 cm simple cyst. In the 1 o'clock position where pt reported tenderness US exam was negatiive.  Because of her history of intermittent nipple drainage, ultrasound was completed of the retroareolar area.  . Personal history of tobacco use, presenting hazards to health   . PONV (postoperative nausea and vomiting)   . Special screening for malignant neoplasms, colon    Past Surgical History:  Procedure Laterality Date  . ABDOMINAL HYSTERECTOMY  1992  . APPENDECTOMY    . CERVICAL DISCECTOMY     S/P C7-T1 discectomy with fusion  . CHOLECYSTECTOMY  1990  . COLONOSCOPY WITH PROPOFOL N/A 02/14/2016   Procedure: COLONOSCOPY WITH PROPOFOL;  Surgeon: Lucilla Lame, MD;  Location: [facility];  Service: Endoscopy;  Laterality: N/A;  PT WOULD LIKE 10 ARRIVAL TIME OR LATER  . DILATION AND CURETTAGE OF UTERUS    . ESOPHAGOGASTRODUODENOSCOPY (EGD) WITH PROPOFOL N/A 02/14/2016   Procedure: ESOPHAGOGASTRODUODENOSCOPY (EGD) WITH PROPOFOL with dialtion;  Surgeon: Lucilla Lame, MD;  Location: [facility];  Service: Endoscopy;  Laterality: N/A;  . MELANOMA EXCISION     removed from Left calf 1994   Family History  Problem Relation Age of Onset  . Hodgkin's lymphoma Mother   . Heart failure Father   . Heart attack Father   . Arthritis Sister     Three sisters w/ degeneratve disk disease  . Headache Sister     Two sisters hx of headache  . Breast cancer Neg Hx   . Colon cancer Neg Hx    Social History   Social History  . Marital status: Widowed    Spouse name: N/A  . Number of children: 2  . Years of education: N/A   Social History Main Topics  . Smoking status: Former Smoker    Packs/day: 1.00    Years: 15.00    Types: Cigarettes  . Smokeless tobacco: Never Used  . Alcohol use No  . Drug use: No  . Sexual activity: Not Asked   Other Topics Concern  . None   Social History Narrative   Married and has 2 children, daughters.    Outpatient Encounter Prescriptions as of 05/28/2016  Medication Sig  . acetaminophen (TYLENOL) 325 MG tablet Take 650 mg by mouth as needed.  Marland Kitchen aspirin EC 81 MG tablet Take 81 mg by mouth daily.  Marland Kitchen  ibuprofen (ADVIL,MOTRIN) 200 MG tablet Take 200 mg by mouth every 6 (six) hours as needed.  Marland Kitchen imipramine (TOFRANIL) 25 MG tablet Take 50 mg by mouth at bedtime. Reported on 02/08/2016  . lovastatin (MEVACOR) 20 MG tablet Take 1 tablet (20 mg total) by mouth at bedtime.  . Multiple Vitamin (MULTI-VITAMIN DAILY PO) Take 1 tablet by mouth daily.  Marland Kitchen omeprazole (PRILOSEC) 20 MG capsule TAKE ONE (1) CAPSULE BY MOUTH 2 TIMES DAILY  . polyethylene glycol (GOLYTELY) 236 g solution Take 4,000 mLs by mouth once. Drink one 8 oz glass every 20 mins until stools are clear  . [DISCONTINUED] busPIRone (BUSPAR) 5 MG tablet Take 1 tablet (5 mg total) by mouth at bedtime.  . [DISCONTINUED] sertraline (ZOLOFT) 50 MG tablet TAKE ONE (1) TABLET BY  MOUTH EVERY DAY  . ALPRAZolam (XANAX) 0.25 MG tablet Take 1 tablet (0.25 mg total) by mouth at bedtime as needed for anxiety.   No facility-administered encounter medications on file as of 05/28/2016.     Review of Systems  Constitutional: Negative for appetite change and unexpected weight change.  Respiratory: Negative for cough and shortness of breath.   Cardiovascular: Negative for chest pain and palpitations.  Gastrointestinal: Negative for diarrhea, nausea and vomiting.  Neurological: Negative for light-headedness and headaches.  Psychiatric/Behavioral: Positive for sleep disturbance.       Increased stress as outlined.         Objective:      Physical Exam  Constitutional: She appears well-developed and well-nourished. No distress.  Neck: Neck supple.  Cardiovascular: Normal rate and regular rhythm.   Pulmonary/Chest: Effort normal and breath sounds normal. No respiratory distress.  Abdominal: Soft. Bowel sounds are normal. There is no tenderness.  Lymphadenopathy:    She has no cervical adenopathy.  Skin: No rash noted. No erythema.  Psychiatric: She has a normal mood and affect. Her behavior is normal.    BP (!) 166/88 (BP Location: Right Arm, Patient Position: Sitting, Cuff Size: Normal)   Pulse 96   Temp 98.1 F (36.7 C) (Oral)   Resp 16   Wt 151 lb (68.5 kg)   LMP 08/09/2012   BMI 26.54 kg/m  Wt Readings from Last 3 Encounters:  05/28/16 151 lb (68.5 kg)  04/08/16 151 lb 4 oz (68.6 kg)  02/14/16 152 lb (68.9 kg)     Lab Results  Component Value Date   WBC 4.8 04/18/2016   HGB 13.1 04/18/2016   HCT 39.0 04/18/2016   PLT 251.0 04/18/2016   GLUCOSE 107 (H) 04/18/2016   CHOL 178 04/18/2016   TRIG 60.0 04/18/2016   HDL 75.20 04/18/2016   LDLCALC 90 04/18/2016   ALT 10 04/18/2016   AST 15 04/18/2016   NA 137 04/18/2016   K 4.6 04/18/2016   CL 102 04/18/2016   CREATININE 0.96 04/18/2016   BUN 7 04/18/2016   CO2 30 04/18/2016   TSH 2.83 04/18/2016     HGBA1C 5.8 04/18/2016       Assessment & Plan:   Problem List Items Addressed This Visit    Stress    Increased stress as outlined.  Not able to sleep.  Controlled during day.  Has tried buspar, sertraline and tylenol pm.  Just needs something to help her relax.  Will give her xanax .25mg  to take q hs prn.  Follow closely.  Notify me if persistent problems.         Other Visit Diagnoses   None.  Einar Pheasant, MD

## 2016-06-02 ENCOUNTER — Encounter: Payer: Self-pay | Admitting: Internal Medicine

## 2016-06-02 NOTE — Assessment & Plan Note (Signed)
Increased stress as outlined.  Not able to sleep.  Controlled during day.  Has tried buspar, sertraline and tylenol pm.  Just needs something to help her relax.  Will give her xanax .25mg  to take q hs prn.  Follow closely.  Notify me if persistent problems.

## 2016-07-09 ENCOUNTER — Encounter: Payer: Self-pay | Admitting: Internal Medicine

## 2016-07-09 ENCOUNTER — Ambulatory Visit (INDEPENDENT_AMBULATORY_CARE_PROVIDER_SITE_OTHER): Payer: Medicare Other | Admitting: Internal Medicine

## 2016-07-09 VITALS — BP 130/84 | HR 90 | Temp 98.4°F | Ht 63.0 in | Wt 151.2 lb

## 2016-07-09 DIAGNOSIS — Z1231 Encounter for screening mammogram for malignant neoplasm of breast: Secondary | ICD-10-CM | POA: Diagnosis not present

## 2016-07-09 DIAGNOSIS — E7800 Pure hypercholesterolemia, unspecified: Secondary | ICD-10-CM | POA: Diagnosis not present

## 2016-07-09 DIAGNOSIS — Z1239 Encounter for other screening for malignant neoplasm of breast: Secondary | ICD-10-CM

## 2016-07-09 DIAGNOSIS — N3010 Interstitial cystitis (chronic) without hematuria: Secondary | ICD-10-CM | POA: Diagnosis not present

## 2016-07-09 DIAGNOSIS — F439 Reaction to severe stress, unspecified: Secondary | ICD-10-CM

## 2016-07-09 DIAGNOSIS — R739 Hyperglycemia, unspecified: Secondary | ICD-10-CM

## 2016-07-09 DIAGNOSIS — K219 Gastro-esophageal reflux disease without esophagitis: Secondary | ICD-10-CM

## 2016-07-09 NOTE — Progress Notes (Signed)
[name], female   DOB: Dec 29, 1940, 75 y.o.   MRN: [id number]   Subjective:    [name], female    DOB: Nov 17, 1940, 75 y.o.   MRN: [id number]  HPI  Patient here for a scheduled follow up.  Overall doing better.  Sleeping better with melatonin.  Taking 5mg .  No chest pain.  States she is eating.  No nausea or vomiting.  Bowels stable.  No increased acid reflux reported.     Past Medical History:  Diagnosis Date  . Breast screening, unspecified 2013  . Cancer ([place]) 1992   skin  . Degenerative disk disease   . GERD (gastroesophageal reflux disease)   . Hypercholesterolemia 2008  . Interstitial cystitis    followed by Dr Jacqlyn Larsen  . Other sign and symptom in breast 2013   Left upper outer quadrant breast "soreness" Ultrasound exam of right breast in the 2 o'clock position with the breast distracted medially showed a 0.3-0.4 with 0.5 cm simple cyst. In the 1 o'clock position where pt reported tenderness US exam was negatiive.  Because of her history of intermittent nipple drainage, ultrasound was completed of the retroareolar area.  . Personal history of tobacco use, presenting hazards to health   . PONV (postoperative nausea and vomiting)   . Special screening for malignant neoplasms, colon    Past Surgical History:  Procedure Laterality Date  . ABDOMINAL HYSTERECTOMY  1992  . APPENDECTOMY    . CERVICAL DISCECTOMY     S/P C7-T1 discectomy with fusion  . CHOLECYSTECTOMY  1990  . COLONOSCOPY WITH PROPOFOL N/A 02/14/2016   Procedure: COLONOSCOPY WITH PROPOFOL;  Surgeon: Lucilla Lame, MD;  Location: [facility];  Service: Endoscopy;  Laterality: N/A;  PT WOULD LIKE 10 ARRIVAL TIME OR LATER  . DILATION AND CURETTAGE OF UTERUS    . ESOPHAGOGASTRODUODENOSCOPY (EGD) WITH PROPOFOL N/A 02/14/2016   Procedure: ESOPHAGOGASTRODUODENOSCOPY (EGD) WITH PROPOFOL with dialtion;  Surgeon: Lucilla Lame, MD;  Location: [facility];  Service: Endoscopy;   Laterality: N/A;  . MELANOMA EXCISION     removed from Left calf 1994   Family History  Problem Relation Age of Onset  . Hodgkin's lymphoma Mother   . Heart failure Father   . Heart attack Father   . Arthritis Sister     Three sisters w/ degeneratve disk disease  . Headache Sister     Two sisters hx of headache  . Breast cancer Neg Hx   . Colon cancer Neg Hx    Social History   Social History  . Marital status: Widowed    Spouse name: N/A  . Number of children: 2  . Years of education: N/A   Social History Main Topics  . Smoking status: Former Smoker    Packs/day: 1.00    Years: 15.00    Types: Cigarettes  . Smokeless tobacco: Never Used  . Alcohol use No  . Drug use: No  . Sexual activity: Not Asked   Other Topics Concern  . None   Social History Narrative   Married and has 2 children, daughters.    Outpatient Encounter Prescriptions as of 07/09/2016  Medication Sig  . acetaminophen (TYLENOL) 325 MG tablet Take 650 mg by mouth as needed.  . ALPRAZolam (XANAX) 0.25 MG tablet Take 1 tablet (0.25 mg total) by mouth at bedtime as needed for anxiety.  Marland Kitchen aspirin EC 81 MG tablet Take 81 mg by mouth daily.  Marland Kitchen ibuprofen (ADVIL,MOTRIN)  200 MG tablet Take 200 mg by mouth every 6 (six) hours as needed.  Marland Kitchen imipramine (TOFRANIL) 25 MG tablet Take 50 mg by mouth at bedtime. Reported on 02/08/2016  . lovastatin (MEVACOR) 20 MG tablet Take 1 tablet (20 mg total) by mouth at bedtime.  . Multiple Vitamin (MULTI-VITAMIN DAILY PO) Take 1 tablet by mouth daily.  . polyethylene glycol (GOLYTELY) 236 g solution Take 4,000 mLs by mouth once. Drink one 8 oz glass every 20 mins until stools are clear  . [DISCONTINUED] omeprazole (PRILOSEC) 20 MG capsule TAKE ONE (1) CAPSULE BY MOUTH 2 TIMES DAILY   No facility-administered encounter medications on file as of 07/09/2016.     Review of Systems  Constitutional: Negative for appetite change and unexpected weight change.  HENT: Negative for  congestion and sinus pressure.   Respiratory: Negative for cough, chest tightness and shortness of breath.   Cardiovascular: Negative for chest pain, palpitations and leg swelling.  Gastrointestinal: Negative for abdominal pain, diarrhea, nausea and vomiting.  Genitourinary: Negative for difficulty urinating and hematuria.  Musculoskeletal:       Some back pain.  Overall stable.  Desires no further intervention.   Skin: Negative for color change and rash.  Neurological: Negative for dizziness, light-headedness and headaches.  Psychiatric/Behavioral: Negative for agitation and dysphoric mood.       Objective:     Blood pressure rechecked by me:  136/78  Physical Exam  Constitutional: She appears well-developed and well-nourished. No distress.  HENT:  Nose: Nose normal.  Mouth/Throat: Oropharynx is clear and moist.  Neck: Neck supple. No thyromegaly present.  Cardiovascular: Normal rate and regular rhythm.   Pulmonary/Chest: Breath sounds normal. No respiratory distress. She has no wheezes.  Abdominal: Soft. Bowel sounds are normal. There is no tenderness.  Musculoskeletal: She exhibits no edema or tenderness.  Lymphadenopathy:    She has no cervical adenopathy.  Skin: No rash noted. No erythema.  Psychiatric: She has a normal mood and affect. Her behavior is normal.    BP 130/84   Pulse 90   Temp 98.4 F (36.9 C) (Oral)   Ht 5\' 3"  (1.6 m)   Wt 151 lb 3.2 oz (68.6 kg)   LMP 08/09/2012   SpO2 96%   BMI 26.78 kg/m  Wt Readings from Last 3 Encounters:  07/09/16 151 lb 3.2 oz (68.6 kg)  05/28/16 151 lb (68.5 kg)  04/08/16 151 lb 4 oz (68.6 kg)     Lab Results  Component Value Date   WBC 4.8 04/18/2016   HGB 13.1 04/18/2016   HCT 39.0 04/18/2016   PLT 251.0 04/18/2016   GLUCOSE 107 (H) 04/18/2016   CHOL 178 04/18/2016   TRIG 60.0 04/18/2016   HDL 75.20 04/18/2016   LDLCALC 90 04/18/2016   ALT 10 04/18/2016   AST 15 04/18/2016   NA 137 04/18/2016   K 4.6  04/18/2016   CL 102 04/18/2016   CREATININE 0.96 04/18/2016   BUN 7 04/18/2016   CO2 30 04/18/2016   TSH 2.83 04/18/2016   HGBA1C 5.8 04/18/2016       Assessment & Plan:   Problem List Items Addressed This Visit    GERD (gastroesophageal reflux disease)    Symptoms controlled on omeprazole.        Hypercholesterolemia    On lovastatin.  Low cholesterol diet and exercise.  Follow lipid panel and liver function tests.        Relevant Orders   Lipid panel  Hepatic function panel   Interstitial cystitis    Has been followed by Dr Jacqlyn Larsen.  Stable.        Stress    Increased stress dealing with her husband's death.  She is doing better. Using melatonin and sleeping better.  Follow.         Other Visit Diagnoses    Breast cancer screening    -  Primary   she will schedule her own mammogram.  order placed.     Relevant Orders   MM DIGITAL SCREENING BILATERAL   Hyperglycemia       Relevant Orders   Basic metabolic panel   Hemoglobin A1c       Einar Pheasant, MD

## 2016-07-09 NOTE — Progress Notes (Signed)
Pre visit review using our clinic review tool, if applicable. No additional management support is needed unless otherwise documented below in the visit note. 

## 2016-07-15 ENCOUNTER — Other Ambulatory Visit: Payer: Self-pay | Admitting: Internal Medicine

## 2016-07-20 ENCOUNTER — Encounter: Payer: Self-pay | Admitting: Internal Medicine

## 2016-07-20 NOTE — Assessment & Plan Note (Signed)
Symptoms controlled on omeprazole.   

## 2016-07-20 NOTE — Assessment & Plan Note (Signed)
On lovastatin.  Low cholesterol diet and exercise.  Follow lipid panel and liver function tests.   

## 2016-07-20 NOTE — Assessment & Plan Note (Signed)
Increased stress dealing with her husband's death.  She is doing better. Using melatonin and sleeping better.  Follow.

## 2016-07-20 NOTE — Assessment & Plan Note (Signed)
Has been followed by Dr Jacqlyn Larsen.  Stable.

## 2016-08-15 ENCOUNTER — Ambulatory Visit
Admission: RE | Admit: 2016-08-15 | Discharge: 2016-08-15 | Disposition: A | Discharge status: Home / Self Care | Payer: Medicare Other | Source: Ambulatory Visit | Attending: Internal Medicine | Admitting: Internal Medicine

## 2016-08-15 DIAGNOSIS — Z1231 Encounter for screening mammogram for malignant neoplasm of breast: Secondary | ICD-10-CM | POA: Insufficient documentation

## 2016-08-15 DIAGNOSIS — Z1239 Encounter for other screening for malignant neoplasm of breast: Secondary | ICD-10-CM

## 2016-08-18 ENCOUNTER — Encounter: Payer: Self-pay | Admitting: Family Medicine

## 2016-08-18 ENCOUNTER — Ambulatory Visit (INDEPENDENT_AMBULATORY_CARE_PROVIDER_SITE_OTHER): Payer: Medicare Other | Admitting: Family Medicine

## 2016-08-18 VITALS — BP 147/76 | HR 101 | Temp 98.2°F | Resp 14 | Wt 152.2 lb

## 2016-08-18 DIAGNOSIS — R3915 Urgency of urination: Secondary | ICD-10-CM | POA: Diagnosis not present

## 2016-08-18 LAB — POCT URINALYSIS DIPSTICK
Blood, UA: NEGATIVE
GLUCOSE UA: NEGATIVE
KETONES UA: NEGATIVE
Nitrite, UA: NEGATIVE
Protein, UA: NEGATIVE
SPEC GRAV UA: 1.015
Urobilinogen, UA: 2
pH, UA: 7

## 2016-08-18 MED ORDER — CIPROFLOXACIN HCL 500 MG PO TABS: 500.0000 mg | ORAL_TABLET | Freq: Two times a day (BID) | ORAL | 0 refills | Status: DC

## 2016-08-18 NOTE — Assessment & Plan Note (Signed)
New acute problem.  Small leukocytes on UA. Discussed waiting on culture versus empiric treatment while awaiting culture. Patient elected for the latter. Starting on Cipro.

## 2016-08-18 NOTE — Patient Instructions (Signed)
Take the antibiotic as prescribed.  We will call regarding the culture results.  Take care  Dr. Lacinda Axon

## 2016-08-18 NOTE — Progress Notes (Signed)
Subjective:  [name], female    DOB: 05/10/1941  Age: 75 y.o. MRN: [id number]  CC: Urinary urgency  HPI:  75 year old female with a history of interstitial cystitis presents with the above complaint.  Patient reports a 3-4 day history of urinary urgency. She states she feels the urge to go and then urinates very little. No associated dysuria. Worse at night. No known exacerbating factors. No known relieving factors. No associated fever or chills. No reports of current back pain. No flank pain. No other associated symptoms. No other complaints this time.  Social Hx   Social History   Social History  . Marital status: Widowed    Spouse name: N/A  . Number of children: 2  . Years of education: N/A   Social History Main Topics  . Smoking status: Former Smoker    Packs/day: 1.00    Years: 15.00    Types: Cigarettes  . Smokeless tobacco: Never Used  . Alcohol use No  . Drug use: No  . Sexual activity: Not Asked   Other Topics Concern  . None   Social History Narrative   Married and has 2 children, daughters.    Review of Systems  Constitutional: Negative.   Genitourinary: Positive for urgency. Negative for dysuria.   Objective:  BP (!) 147/76 (BP Location: Left Arm, Patient Position: Sitting, Cuff Size: Normal)   Pulse (!) 101   Temp 98.2 F (36.8 C) (Oral)   Resp 14   Wt 152 lb 4 oz (69.1 kg)   LMP 08/09/2012   SpO2 97%   BMI 26.97 kg/m   BP/Weight 08/18/2016 07/09/2016 [id number]  Systolic BP [id number]  Diastolic BP 76 84 88  Wt. (Lbs) 152.25 151.2 151  BMI 26.97 26.78 26.54   Physical Exam  Constitutional: She is oriented to person, place, and time. She appears well-developed. No distress.  Cardiovascular: Normal rate and regular rhythm.   Pulmonary/Chest: Effort normal.  Abdominal: Soft. She exhibits no distension. There is no tenderness.  Neurological: She is alert and oriented to person, place, and time.  Psychiatric: She has  a normal mood and affect.  Vitals reviewed.  Lab Results  Component Value Date   WBC 4.8 04/18/2016   HGB 13.1 04/18/2016   HCT 39.0 04/18/2016   PLT 251.0 04/18/2016   GLUCOSE 107 (H) 04/18/2016   CHOL 178 04/18/2016   TRIG 60.0 04/18/2016   HDL 75.20 04/18/2016   LDLCALC 90 04/18/2016   ALT 10 04/18/2016   AST 15 04/18/2016   NA 137 04/18/2016   K 4.6 04/18/2016   CL 102 04/18/2016   CREATININE 0.96 04/18/2016   BUN 7 04/18/2016   CO2 30 04/18/2016   TSH 2.83 04/18/2016   HGBA1C 5.8 04/18/2016   Results for orders placed or performed in visit on 08/18/16 (from the past 24 hour(s))  POCT Urinalysis Dipstick     Status: Abnormal   Collection Time: 08/18/16 10:23 AM  Result Value Ref Range   Color, UA yellow    Clarity, UA clear    Glucose, UA neg    Bilirubin, UA small    Ketones, UA neg    Spec Grav, UA 1.015    Blood, UA neg    pH, UA 7.0    Protein, UA neg    Urobilinogen, UA 2.0    Nitrite, UA neg    Leukocytes, UA small (1+) (A) Negative   Assessment & Plan:   Problem List  Items Addressed This Visit    Urinary urgency - Primary    New acute problem.  Small leukocytes on UA. Discussed waiting on culture versus empiric treatment while awaiting culture. Patient elected for the latter. Starting on Cipro.      Relevant Orders   POCT Urinalysis Dipstick (Completed)   Urine Culture      Meds ordered this encounter  Medications  . ciprofloxacin (CIPRO) 500 MG tablet    Sig: Take 1 tablet (500 mg total) by mouth 2 (two) times daily.    Dispense:  10 tablet    Refill:  0    Follow-up: PRN  [place]

## 2016-08-20 LAB — URINE CULTURE

## 2016-10-20 ENCOUNTER — Telehealth: Payer: Self-pay | Admitting: Internal Medicine

## 2016-10-20 ENCOUNTER — Other Ambulatory Visit: Payer: Self-pay

## 2016-10-20 MED ORDER — OMEPRAZOLE 20 MG PO CPDR: 20.0000 mg | DELAYED_RELEASE_CAPSULE | Freq: Two times a day (BID) | ORAL | 1 refills | Status: DC

## 2016-10-20 NOTE — Telephone Encounter (Signed)
rx sent

## 2016-10-20 NOTE — Telephone Encounter (Signed)
Pt called requesting a refill on omeprazole (PRILOSEC) 20 MG capsule, she will going out of town and needs to refill before Wednesday. Please advise, thank you!  [place], [place]  Call pt @ [phone]

## 2016-11-03 ENCOUNTER — Telehealth: Payer: Self-pay | Admitting: Internal Medicine

## 2016-11-03 DIAGNOSIS — R3915 Urgency of urination: Secondary | ICD-10-CM

## 2016-11-03 NOTE — Telephone Encounter (Signed)
Pt called about needing a refill for lovastatin (MEVACOR) 20 MG tablet.   Pharmacy is [facility], [place]  Call pt @ [phone]. Thank you!   Pt also would like to get a referral to the urologist. Pt is requesting a lady doctor. Thank you!

## 2016-11-04 ENCOUNTER — Other Ambulatory Visit: Payer: Self-pay

## 2016-11-04 MED ORDER — LOVASTATIN 20 MG PO TABS
20.0000 mg | ORAL_TABLET | Freq: Every day | ORAL | 1 refills | Status: DC
Start: 2016-11-04 — End: 2017-01-29

## 2016-11-04 NOTE — Telephone Encounter (Signed)
I do not mind placing an order for a referral to a female urologist.  There is one in town  - Dr Hollice Espy.  Is she ok seeing her?  Also, need to know if anything new is going on.  She has been seeing Dr Jacqlyn Larsen for urinary urgency, etc and just need to know what referral is for .  Thanks

## 2016-11-04 NOTE — Telephone Encounter (Signed)
Pt returned call wanted to move providers to someone here in [place] for urinary urgency. Dr. Jacqlyn Larsen is to far of drive.

## 2016-11-04 NOTE — Telephone Encounter (Signed)
Have called in refill on meds. She would like referral to urologist that is female.

## 2016-11-04 NOTE — Telephone Encounter (Signed)
Lm to call office

## 2016-11-05 NOTE — Telephone Encounter (Signed)
Order placed for urology referral.  

## 2016-11-20 ENCOUNTER — Ambulatory Visit: Payer: Medicare Other | Admitting: Urology

## 2016-11-20 ENCOUNTER — Encounter: Payer: Self-pay | Admitting: Urology

## 2016-11-20 VITALS — BP 137/76 | HR 86 | Ht 63.0 in | Wt 154.8 lb

## 2016-11-20 DIAGNOSIS — R3915 Urgency of urination: Secondary | ICD-10-CM

## 2016-11-20 DIAGNOSIS — N952 Postmenopausal atrophic vaginitis: Secondary | ICD-10-CM | POA: Diagnosis not present

## 2016-11-20 LAB — MICROSCOPIC EXAMINATION: Epithelial Cells (non renal): >10 /hpf — AB (ref 0–10)

## 2016-11-20 LAB — URINALYSIS, COMPLETE
Bilirubin, UA: NEGATIVE
Glucose, UA: NEGATIVE
Ketones, UA: NEGATIVE
NITRITE UA: NEGATIVE
PH UA: 6 (ref 5.0–7.5)
Protein, UA: NEGATIVE
RBC, UA: NEGATIVE
Specific Gravity, UA: 1.01 (ref 1.005–1.030)
UUROB: 0.2 mg/dL (ref 0.2–1.0)

## 2016-11-20 LAB — BLADDER SCAN AMB NON-IMAGING: Scan Result: 24

## 2016-11-20 NOTE — Progress Notes (Signed)
11/20/2016 3:59 PM   Rachael Austin 02/07/1941 [id number] Referring provider: Einar Pheasant, MD [id number] [name], [name] [address]  Chief Complaint  Patient presents with  . New Patient (Initial Visit)    Urinary urgency referred by Nils Flack MD    HPI: Patient is a 76 year old Caucasian female who is referred to [place] by, Dr. Nicki Reaper, for urinary urgency.    Patient states that she has had urinary urgency for several years and has been on Tofranil for the last few years.  Patient has incontinence with urgency.   She is experiencing many incontinent episodes during the day. She is experiencing no incontinent episodes during the night.  Her incontinence volume is minor.   She is wearing panty liners daily.    She is having associated urinary frequency, urgency, nocturia x 3, intermittency, hesitancy and straining to urinate.  She does not have a history of urinary tract infections, STI's or injury to the bladder.   She denies dysuria, gross hematuria, suprapubic pain, back pain, abdominal pain or flank pain.  She has not had any recent fevers, chills, nausea or vomiting.   She underwent a vaginal sling 10 years ago.  She does not have a history of nephrolithiasis or GU trauma.  She is not sexually active.  She is post menopausal.    She denies constipation and/or diarrhea.  She is not having pain with bladder filling.    She has not had any recent imaging studies.    She is drinking water with meals.  She is having one cup of coffee daily.  She is not drinking alcoholic beverages daily.  No sodas.  No artificial sweeteners.    Her risk factors for incontinence are a family history of incontinence, age, caffeine, vaginal atrophy and pelvic surgery.  She is taking benzo's and antidepressants.    Her UA today demonstrates 11-30 WBC's.  PVR was 24 mL.    PMH: Past Medical History:  Diagnosis Date  . Breast screening, unspecified 2013  . Cancer ([place])  1992   skin  . Degenerative disk disease   . GERD (gastroesophageal reflux disease)   . Hypercholesterolemia 2008  . Interstitial cystitis    followed by Dr Jacqlyn Larsen  . Other sign and symptom in breast 2013   Left upper outer quadrant breast "soreness" Ultrasound exam of right breast in the 2 o'clock position with the breast distracted medially showed a 0.3-0.4 with 0.5 cm simple cyst. In the 1 o'clock position where pt reported tenderness US exam was negatiive.  Because of her history of intermittent nipple drainage, ultrasound was completed of the retroareolar area.  . Personal history of tobacco use, presenting hazards to health   . PONV (postoperative nausea and vomiting)   . Special screening for malignant neoplasms, colon     Surgical History: Past Surgical History:  Procedure Laterality Date  . ABDOMINAL HYSTERECTOMY  1992  . APPENDECTOMY    . CERVICAL DISCECTOMY     S/P C7-T1 discectomy with fusion  . CHOLECYSTECTOMY  1990  . COLONOSCOPY WITH PROPOFOL N/A 02/14/2016   Procedure: COLONOSCOPY WITH PROPOFOL;  Surgeon: Lucilla Lame, MD;  Location: [facility];  Service: Endoscopy;  Laterality: N/A;  PT WOULD LIKE 10 ARRIVAL TIME OR LATER  . DILATION AND CURETTAGE OF UTERUS    . ESOPHAGOGASTRODUODENOSCOPY (EGD) WITH PROPOFOL N/A 02/14/2016   Procedure: ESOPHAGOGASTRODUODENOSCOPY (EGD) WITH PROPOFOL with dialtion;  Surgeon: Lucilla Lame, MD;  Location: [facility];  Service: Endoscopy;  Laterality: N/A;  . MELANOMA EXCISION     removed from Left calf 1994    Home Medications:  Allergies as of 11/20/2016      Reactions   Augmentin [amoxicillin-pot Clavulanate] Swelling, Rash   Swelling of the lips   Codeine Sulfate Other (See Comments)   "Makes her hyper"      Medication List       Accurate as of 11/20/16  3:59 PM. Always use your most recent med list.          acetaminophen 325 MG tablet Commonly known as:  TYLENOL Take 650 mg by mouth as needed.     ALPRAZolam 0.25 MG tablet Commonly known as:  XANAX Take 1 tablet (0.25 mg total) by mouth at bedtime as needed for anxiety.   aspirin EC 81 MG tablet Take 81 mg by mouth daily.   ibuprofen 200 MG tablet Commonly known as:  ADVIL,MOTRIN Take 200 mg by mouth every 6 (six) hours as needed.   imipramine 25 MG tablet Commonly known as:  TOFRANIL Take 50 mg by mouth at bedtime. Reported on 02/08/2016   lovastatin 20 MG tablet Commonly known as:  MEVACOR Take 1 tablet (20 mg total) by mouth at bedtime.   Melatonin 10 MG Caps Take by mouth.   MULTI-VITAMIN DAILY PO Take 1 tablet by mouth daily.   omeprazole 20 MG capsule Commonly known as:  PRILOSEC Take 1 capsule (20 mg total) by mouth 2 (two) times daily before a meal.   polyethylene glycol 236 g solution Commonly known as:  GOLYTELY Take 4,000 mLs by mouth once. Drink one 8 oz glass every 20 mins until stools are clear       Allergies:  Allergies  Allergen Reactions  . Augmentin [Amoxicillin-Pot Clavulanate] Swelling and Rash    Swelling of the lips  . Codeine Sulfate Other (See Comments)    "Makes her hyper"    Family History: Family History  Problem Relation Age of Onset  . Hodgkin's lymphoma Mother   . Heart failure Father   . Heart attack Father   . Arthritis Sister     Three sisters w/ degeneratve disk disease  . Headache Sister     Two sisters hx of headache  . Breast cancer Neg Hx   . Colon cancer Neg Hx   . Bladder Cancer Neg Hx   . Kidney cancer Neg Hx     Social History:  reports that she has quit smoking. Her smoking use included Cigarettes. She has a 15.00 pack-year smoking history. She has never used smokeless tobacco. She reports that she does not drink alcohol or use drugs.  ROS: UROLOGY Frequent Urination?: Yes Hard to postpone urination?: No Burning/pain with urination?: No Get up at night to urinate?: Yes Leakage of urine?: Yes Urine stream starts and stops?: Yes Trouble starting  stream?: Yes Do you have to strain to urinate?: Yes Blood in urine?: No Urinary tract infection?: No Sexually transmitted disease?: No Injury to kidneys or bladder?: No Painful intercourse?: No Weak stream?: No Currently pregnant?: No Vaginal bleeding?: No Last menstrual period?: n  Gastrointestinal Nausea?: No Vomiting?: No Indigestion/heartburn?: No Diarrhea?: No Constipation?: No  Constitutional Fever: No Night sweats?: No Weight loss?: No Fatigue?: Yes  Skin Skin rash/lesions?: No Itching?: No  Eyes Blurred vision?: No Double vision?: No  Ears/Nose/Throat Sore throat?: No Sinus problems?: No  Hematologic/Lymphatic Swollen glands?: No Easy bruising?: Yes  Cardiovascular Leg swelling?: No Chest pain?: No  Respiratory Cough?: No Shortness of breath?: No  Endocrine Excessive thirst?: Yes  Musculoskeletal Back pain?: No Joint pain?: No  Neurological Headaches?: No Dizziness?: No  Psychologic Depression?: No Anxiety?: No  Physical Exam: BP 137/76 (BP Location: Left Arm, Patient Position: Sitting, Cuff Size: Normal)   Pulse 86   Ht 5\' 3"  (1.6 m)   Wt 154 lb 12.8 oz (70.2 kg)   LMP 11/20/1976   BMI 27.42 kg/m   Constitutional: Well nourished. Alert and oriented, No acute distress. HEENT: [place] AT, moist mucus membranes. Trachea midline, no masses. Cardiovascular: No clubbing, cyanosis, or edema. Respiratory: Normal respiratory effort, no increased work of breathing. GI: Abdomen is soft, non tender, non distended, no abdominal masses. Liver and spleen not palpable.  No hernias appreciated.  Stool sample for occult testing is not indicated.   GU: No CVA tenderness.  No bladder fullness or masses.  Atrophic external genitalia, normal pubic hair distribution, no lesions.  Normal urethral meatus, no lesions, no prolapse, no discharge.   No urethral masses, tenderness and/or tenderness. No bladder fullness, tenderness or masses. Pale vagina mucosa,  poor estrogen effect, no discharge, no lesions, good pelvic support, no cystocele or rectocele noted.  Cervix, uterus and adnexa are surgically absent.  Anus and perineum are without rashes or lesions.  Skin: No rashes, bruises or suspicious lesions. Lymph: No cervical or inguinal adenopathy. Neurologic: Grossly intact, no focal deficits, moving all 4 extremities. Psychiatric: Normal mood and affect.  Laboratory Data: Lab Results  Component Value Date   WBC 4.8 04/18/2016   HGB 13.1 04/18/2016   HCT 39.0 04/18/2016   MCV 89.0 04/18/2016   PLT 251.0 04/18/2016    Lab Results  Component Value Date   CREATININE 0.96 04/18/2016    Lab Results  Component Value Date   HGBA1C 5.8 04/18/2016    Lab Results  Component Value Date   TSH 2.83 04/18/2016       Component Value Date/Time   CHOL 178 04/18/2016 1006   HDL 75.20 04/18/2016 1006   CHOLHDL 2 04/18/2016 1006   VLDL 12.0 04/18/2016 1006   LDLCALC 90 04/18/2016 1006    Lab Results  Component Value Date   AST 15 04/18/2016   Lab Results  Component Value Date   ALT 10 04/18/2016    Urinalysis 11-30 WBC's.  See EPIC.   Pertinent Imaging: Results for SAMATHA, LOMBERA (MRN [id number]) as of 11/29/2016 18:55  Ref. Range 11/20/2016 15:05  Scan Result Unknown 24    Assessment & Plan:    1. Urgency  - offered behavioral therapies, bladder training, bladder control strategies and pelvic floor muscle training - patient deferred at this time  - fluid management - encouraged to increase the water intake  - not a candidate for anticholinergic therapy due to age  - offered therapy with beta-3 adrenergic receptor agonist and the potential side effects of each therapy   - would like to try the beta-3 adrenergic receptor agonist (Myrbetriq).  Given Myrbetriq 25 mg samples, #28.  I have reviewed with the patient of the side effects of Myrbetriq, such as: elevation in BP, urinary retention and/or HA.  She will return in one month  for PVR and symptom recheck.    -discontinue the Tofranil  - RTC in 3 weeks for PVR and symptom recheck   2. Vaginal atrophy  - advise patient to use coconut oil or olive oil   Return in about 3 years (around 11/21/2019) for PVR and OAB  questionnaire.  These notes generated with voice recognition software. I apologize for typographical errors.  Zara Council, [facility] [address] [place], [place] [address] [phone]

## 2016-12-11 ENCOUNTER — Ambulatory Visit: Payer: Medicare Other | Admitting: Urology

## 2017-01-14 ENCOUNTER — Telehealth: Payer: Self-pay | Admitting: Internal Medicine

## 2017-01-14 NOTE — Telephone Encounter (Signed)
Called pt. No answer, just kept ringing. Pt due for AWV  [facility] Call back # [phone]

## 2017-01-19 ENCOUNTER — Ambulatory Visit: Payer: Medicare Other | Admitting: Family

## 2017-01-19 ENCOUNTER — Telehealth: Payer: Self-pay | Admitting: Internal Medicine

## 2017-01-19 NOTE — Telephone Encounter (Signed)
Patient walk-n to clinic late for 1:15 appointment patient had complaint of frequency of urination, left flank pain, and left lower back pain on scale of 8 out 0-10 , with nausea. Patient stated she was out shopping and just forgot appointment advised patient I would take vitals and talk with PCP . Vital Temp 98.4 pulse 82 BP 158/72  02 sats 99. Advised PCP of patient complaint and missed appointment was advised by PCP to have patient evaluated by [facility] walk-in ,[facility] er or [facility] urgent care.  Patient agreed and called daughter and they are going together to one of the facilities recommended.

## 2017-01-20 NOTE — Telephone Encounter (Signed)
Reviewed information.  Had appt with Joycelyn Schmid at 1:15.  Walked into office at approximately 1530.  Given increased pain as outlined, feel evaluation warranted.  Pt agreed to go to be evaluated.

## 2017-01-26 ENCOUNTER — Telehealth: Payer: Self-pay | Admitting: Internal Medicine

## 2017-01-26 NOTE — Telephone Encounter (Signed)
Please see 01/19/17 phone note as well

## 2017-01-26 NOTE — Telephone Encounter (Signed)
Pt called stating that she wanted to see Dr. Nicki Reaper on a certain day and time this week I told her that there were no available appt for this week and I could send a message back and see what we could do. She stated that she had an appt last week that she was late for and she says that "we did nothing to try to help her" (she was triage because she was 2 hours late). She started yelling and getting rude with me stating that "I didn't know how to do my job".. That "Its ridiculous  that she cant even see her Doctor that does nothing for her just pushes her off on other Doctors". She told me thanks for nothing and hung up on me.. Please advise

## 2017-01-27 NOTE — Telephone Encounter (Signed)
Can you call Rachael Austin and let her know that I was not in the office 01/26/17 (when she called).  Need to know what she needs to be seen for to see about working her in for an appointment.  Also, need to clarify (given her statement about me) if she continues to plan to f/u here with me.  If no, then we can help facilitate transfer of her records to make sure she gets the care she needs.  See me before calling.

## 2017-01-27 NOTE — Telephone Encounter (Signed)
Patient was seen at [facility] clinic for possible UTI symptoms 01/19/17, which was DX as constipation with gastritis, patient is still feeling constipated and not emptying her bowels. Having Back Pain with nausea no vomiting , stools are hard brown in color. Patient was very happy to get appointment scheduled for 01/29/17 @ 9.

## 2017-01-29 ENCOUNTER — Ambulatory Visit (INDEPENDENT_AMBULATORY_CARE_PROVIDER_SITE_OTHER): Payer: Medicare Other | Admitting: Internal Medicine

## 2017-01-29 ENCOUNTER — Encounter: Payer: Self-pay | Admitting: Internal Medicine

## 2017-01-29 ENCOUNTER — Telehealth: Payer: Self-pay

## 2017-01-29 VITALS — BP 140/78 | HR 79 | Temp 98.4°F | Resp 12 | Ht 64.0 in | Wt 156.0 lb

## 2017-01-29 DIAGNOSIS — K219 Gastro-esophageal reflux disease without esophagitis: Secondary | ICD-10-CM

## 2017-01-29 DIAGNOSIS — R140 Abdominal distension (gaseous): Secondary | ICD-10-CM | POA: Diagnosis not present

## 2017-01-29 DIAGNOSIS — R109 Unspecified abdominal pain: Secondary | ICD-10-CM | POA: Diagnosis not present

## 2017-01-29 DIAGNOSIS — N3010 Interstitial cystitis (chronic) without hematuria: Secondary | ICD-10-CM | POA: Diagnosis not present

## 2017-01-29 LAB — HEPATIC FUNCTION PANEL
ALK PHOS: 72 U/L (ref 39–117)
ALT: 11 U/L (ref 0–35)
AST: 15 U/L (ref 0–37)
Albumin: 4.3 g/dL (ref 3.5–5.2)
BILIRUBIN DIRECT: 0.1 mg/dL (ref 0.0–0.3)
BILIRUBIN TOTAL: 0.5 mg/dL (ref 0.2–1.2)
Total Protein: 6.7 g/dL (ref 6.0–8.3)

## 2017-01-29 LAB — LIPASE: Lipase: 34 U/L (ref 11.0–59.0)

## 2017-01-29 LAB — BASIC METABOLIC PANEL
BUN: 9 mg/dL (ref 6–23)
CHLORIDE: 105 meq/L (ref 96–112)
CO2: 29 mEq/L (ref 19–32)
CREATININE: 0.81 mg/dL (ref 0.40–1.20)
Calcium: 9 mg/dL (ref 8.4–10.5)
GFR: 72.86 mL/min (ref >60.00)
GLUCOSE: 101 mg/dL — AB (ref 70–99)
POTASSIUM: 4.5 meq/L (ref 3.5–5.1)
Sodium: 140 mEq/L (ref 135–145)

## 2017-01-29 LAB — AMYLASE: Amylase: 26 U/L — ABNORMAL LOW (ref 27–131)

## 2017-01-29 LAB — TSH: TSH: 3.9 u[IU]/mL (ref 0.35–4.50)

## 2017-01-29 MED ORDER — OMEPRAZOLE 20 MG PO CPDR: 20.0000 mg | DELAYED_RELEASE_CAPSULE | Freq: Two times a day (BID) | ORAL | 1 refills | Status: DC

## 2017-01-29 MED ORDER — LOVASTATIN 20 MG PO TABS: 20.0000 mg | ORAL_TABLET | Freq: Every day | ORAL | 1 refills | Status: DC

## 2017-01-29 NOTE — Telephone Encounter (Signed)
I reviewed the notes and xray from acute care.  Xray revealed no obstruction.  She did have a moderate amount of stool in the bowel.  I am going to get a little more labs to check liver function, kidney function and pancreas.  I am also going to schedule her for CT (abdomen and pelvis) - to try to help determine etiology of her symptoms and bowel change.

## 2017-01-29 NOTE — Progress Notes (Signed)
[name], female   DOB: 09-Mar-1941, 76 y.o.   MRN: [id number]   Subjective:    [name], female    DOB: 1941-01-23, 76 y.o.   MRN: [id number]  HPI  Patient here for an acute care follow up.  Presented to acute care with nausea and left flank and left lower abdomina pain.  Was diagnosed with gastritis and constipation.  Had xray and cbc.  Xray revealed no evidence of bowel obstruction or ileus.  Moderate stool seen throughout the colon.  Recommended miralax and enemas.  Has not used enema.  Is taking miralax.  Eating and drinking.  Reports LLQ pain and some back pain over the last month.  Some nausea.  No vomiting.  She is eating.  Feels hungry.  Taking PPI bid.  Does feel some better, but still with discomfort as outlined.  No fever.  No blood in her stool.     Past Medical History:  Diagnosis Date  . Breast screening, unspecified 2013  . Cancer ([place]) 1992   skin  . Degenerative disk disease   . GERD (gastroesophageal reflux disease)   . Hypercholesterolemia 2008  . Interstitial cystitis    followed by Dr Jacqlyn Larsen  . Other sign and symptom in breast 2013   Left upper outer quadrant breast "soreness" Ultrasound exam of right breast in the 2 o'clock position with the breast distracted medially showed a 0.3-0.4 with 0.5 cm simple cyst. In the 1 o'clock position where pt reported tenderness US exam was negatiive.  Because of her history of intermittent nipple drainage, ultrasound was completed of the retroareolar area.  . Personal history of tobacco use, presenting hazards to health   . PONV (postoperative nausea and vomiting)   . Special screening for malignant neoplasms, colon    Past Surgical History:  Procedure Laterality Date  . ABDOMINAL HYSTERECTOMY  1992  . APPENDECTOMY    . CERVICAL DISCECTOMY     S/P C7-T1 discectomy with fusion  . CHOLECYSTECTOMY  1990  . COLONOSCOPY WITH PROPOFOL N/A 02/14/2016   Procedure: COLONOSCOPY WITH PROPOFOL;   Surgeon: Lucilla Lame, MD;  Location: [facility];  Service: Endoscopy;  Laterality: N/A;  PT WOULD LIKE 10 ARRIVAL TIME OR LATER  . DILATION AND CURETTAGE OF UTERUS    . ESOPHAGOGASTRODUODENOSCOPY (EGD) WITH PROPOFOL N/A 02/14/2016   Procedure: ESOPHAGOGASTRODUODENOSCOPY (EGD) WITH PROPOFOL with dialtion;  Surgeon: Lucilla Lame, MD;  Location: [facility];  Service: Endoscopy;  Laterality: N/A;  . MELANOMA EXCISION     removed from Left calf 1994   Family History  Problem Relation Age of Onset  . Hodgkin's lymphoma Mother   . Heart failure Father   . Heart attack Father   . Arthritis Sister     Three sisters w/ degeneratve disk disease  . Headache Sister     Two sisters hx of headache  . Breast cancer Neg Hx   . Colon cancer Neg Hx   . Bladder Cancer Neg Hx   . Kidney cancer Neg Hx    Social History   Social History  . Marital status: Widowed    Spouse name: N/A  . Number of children: 2  . Years of education: N/A   Social History Main Topics  . Smoking status: Former Smoker    Packs/day: 1.00    Years: 15.00    Types: Cigarettes  . Smokeless tobacco: Never Used  . Alcohol use No  . Drug use: No  .  Sexual activity: Not Asked   Other Topics Concern  . None   Social History Narrative   Married and has 2 children, daughters.    Outpatient Encounter Prescriptions as of 01/29/2017  Medication Sig  . acetaminophen (TYLENOL) 325 MG tablet Take 650 mg by mouth as needed.  . ALPRAZolam (XANAX) 0.25 MG tablet Take 1 tablet (0.25 mg total) by mouth at bedtime as needed for anxiety.  Marland Kitchen aspirin EC 81 MG tablet Take 81 mg by mouth daily.  Marland Kitchen ibuprofen (ADVIL,MOTRIN) 200 MG tablet Take 200 mg by mouth every 6 (six) hours as needed.  . lovastatin (MEVACOR) 20 MG tablet Take 1 tablet (20 mg total) by mouth at bedtime.  . Melatonin 10 MG CAPS Take by mouth.  . Multiple Vitamin (MULTI-VITAMIN DAILY PO) Take 1 tablet by mouth daily.  Marland Kitchen omeprazole (PRILOSEC) 20 MG capsule  Take 1 capsule (20 mg total) by mouth 2 (two) times daily before a meal.  . polyethylene glycol (GOLYTELY) 236 g solution Take 4,000 mLs by mouth once. Drink one 8 oz glass every 20 mins until stools are clear  . [DISCONTINUED] imipramine (TOFRANIL) 25 MG tablet Take 50 mg by mouth at bedtime. Reported on 02/08/2016  . [DISCONTINUED] lovastatin (MEVACOR) 20 MG tablet Take 1 tablet (20 mg total) by mouth at bedtime.  . [DISCONTINUED] omeprazole (PRILOSEC) 20 MG capsule Take 1 capsule (20 mg total) by mouth 2 (two) times daily before a meal.   No facility-administered encounter medications on file as of 01/29/2017.     Review of Systems  Constitutional: Negative for appetite change, fever and unexpected weight change.  HENT: Negative for congestion and sinus pressure.   Respiratory: Negative for chest tightness and shortness of breath.   Cardiovascular: Negative for chest pain, palpitations and leg swelling.  Gastrointestinal: Positive for abdominal pain and nausea. Negative for vomiting.  Genitourinary: Negative for difficulty urinating and dysuria.  Musculoskeletal: Positive for back pain. Negative for joint swelling.  Skin: Negative for color change and rash.  Neurological: Negative for dizziness, light-headedness and headaches.  Psychiatric/Behavioral: Negative for agitation and dysphoric mood.       Objective:    Physical Exam  Constitutional: She appears well-developed and well-nourished. No distress.  HENT:  Nose: Nose normal.  Mouth/Throat: Oropharynx is clear and moist.  Neck: Neck supple. No thyromegaly present.  Cardiovascular: Normal rate and regular rhythm.   Pulmonary/Chest: Breath sounds normal. No respiratory distress. She has no wheezes.  Abdominal: Soft. Bowel sounds are normal.  Minimal tenderness to palpation lower abdomen.  No rebound or guarding.    Musculoskeletal: She exhibits no edema or tenderness.  Lymphadenopathy:    She has no cervical adenopathy.  Skin:  No rash noted. No erythema.  Psychiatric: She has a normal mood and affect. Her behavior is normal.    BP 140/78 (BP Location: Left Arm, Patient Position: Sitting, Cuff Size: Normal)   Pulse 79   Temp 98.4 F (36.9 C) (Oral)   Resp 12   Ht 5\' 4"  (1.626 m)   Wt 156 lb (70.8 kg)   LMP 08/09/2012   SpO2 99%   BMI 26.78 kg/m  Wt Readings from Last 3 Encounters:  01/29/17 156 lb (70.8 kg)  11/20/16 154 lb 12.8 oz (70.2 kg)  08/18/16 152 lb 4 oz (69.1 kg)     Lab Results  Component Value Date   WBC 4.8 04/18/2016   HGB 13.1 04/18/2016   HCT 39.0 04/18/2016   PLT 251.0 04/18/2016  GLUCOSE 101 (H) 01/29/2017   CHOL 178 04/18/2016   TRIG 60.0 04/18/2016   HDL 75.20 04/18/2016   LDLCALC 90 04/18/2016   ALT 11 01/29/2017   AST 15 01/29/2017   NA 140 01/29/2017   K 4.5 01/29/2017   CL 105 01/29/2017   CREATININE 0.81 01/29/2017   BUN 9 01/29/2017   CO2 29 01/29/2017   TSH 3.90 01/29/2017   HGBA1C 5.8 04/18/2016    Mm Screening Breast Tomo Bilateral  Result Date: 08/18/2016 CLINICAL DATA:  Screening. EXAM: 2D DIGITAL SCREENING BILATERAL MAMMOGRAM WITH CAD AND ADJUNCT TOMO COMPARISON:  None. ACR Breast Density Category b: There are scattered areas of fibroglandular density. FINDINGS: There are no findings suspicious for malignancy. Images were processed with CAD. IMPRESSION: No mammographic evidence of malignancy. A result letter of this screening mammogram will be mailed directly to the patient. RECOMMENDATION: Screening mammogram in one year. (Code:SM-B-01Y) BI-RADS CATEGORY  1: Negative. Electronically Signed   By: Everlean Alstrom M.D.   On: 08/18/2016 07:38       Assessment & Plan:   Problem List Items Addressed This Visit    Abdominal pain - Primary    Pain as outlined.  Persistent.  Xray as outlined.  Eating.  Taking miralax.  Check amylase, lipase and liver panel.  Also check CT abdomen and pelvis.  Further w/up pending results.        Relevant Orders   CT  Abdomen Pelvis W Contrast   TSH (Completed)   Hepatic function panel (Completed)   Basic metabolic panel (Completed)   Amylase (Completed)   Lipase (Completed)   Bloating    Check CT and labs as outlined.  Continue miralax and PPI.        Relevant Orders   CT Abdomen Pelvis W Contrast   TSH (Completed)   Chronic interstitial cystitis    Appears to be stable.  Followed by Dr Jacqlyn Larsen.        GERD (gastroesophageal reflux disease)    Continue bid PPI.        Relevant Medications   omeprazole (PRILOSEC) 20 MG capsule       Einar Pheasant, MD

## 2017-01-29 NOTE — Telephone Encounter (Signed)
Spoke with daughter she did not have any further questions

## 2017-01-29 NOTE — Telephone Encounter (Signed)
Left message to return call to our office.  

## 2017-01-29 NOTE — Progress Notes (Signed)
Pre-visit discussion using our clinic review tool. No additional management support is needed unless otherwise documented below in the visit note.  

## 2017-01-29 NOTE — Telephone Encounter (Signed)
Daughter wanted to see if I could call after patient visit. She was wanting to get an overview of what you felt was going on with her mom. Informed that I will call back at later time to review

## 2017-02-02 ENCOUNTER — Encounter: Payer: Self-pay | Admitting: Internal Medicine

## 2017-02-02 NOTE — Assessment & Plan Note (Signed)
Check CT and labs as outlined.  Continue miralax and PPI.

## 2017-02-02 NOTE — Assessment & Plan Note (Signed)
Appears to be stable.  Followed by Dr Jacqlyn Larsen.

## 2017-02-02 NOTE — Assessment & Plan Note (Signed)
Continue bid PPI.

## 2017-02-02 NOTE — Assessment & Plan Note (Signed)
Pain as outlined.  Persistent.  Xray as outlined.  Eating.  Taking miralax.  Check amylase, lipase and liver panel.  Also check CT abdomen and pelvis.  Further w/up pending results.

## 2017-02-03 NOTE — Telephone Encounter (Signed)
Patient is scheduled for her CT on 5/11. Pt states that she can not drink the contrast d/t making her sick. She asked about the pill that she has heard of. Dr. Nicki Reaper is not aware of a pill. Told pt that if she can not do the scan then Dr. Nicki Reaper would like to refer her to GI for her abdominal pain.  She is going to think about it and call me back to let me know what she wants to do.

## 2017-02-05 ENCOUNTER — Encounter: Payer: Self-pay | Admitting: Family Medicine

## 2017-02-05 ENCOUNTER — Ambulatory Visit (INDEPENDENT_AMBULATORY_CARE_PROVIDER_SITE_OTHER): Payer: Medicare Other | Admitting: Family Medicine

## 2017-02-05 DIAGNOSIS — J988 Other specified respiratory disorders: Secondary | ICD-10-CM | POA: Diagnosis not present

## 2017-02-05 DIAGNOSIS — J069 Acute upper respiratory infection, unspecified: Secondary | ICD-10-CM | POA: Insufficient documentation

## 2017-02-05 MED ORDER — DOXYCYCLINE HYCLATE 100 MG PO TABS: 100.0000 mg | ORAL_TABLET | Freq: Two times a day (BID) | ORAL | 0 refills | Status: DC

## 2017-02-05 MED ORDER — HYDROCOD POLST-CPM POLST ER 10-8 MG/5ML PO SUER: 5.0000 mL | Freq: Two times a day (BID) | ORAL | 0 refills | Status: DC | PRN

## 2017-02-05 NOTE — Progress Notes (Signed)
Subjective:  [name], female    DOB: [birth year]  Age: 76 y.o. MRN: [id number]  CC: URI symptoms  HPI:  76 year old female presents with respiratory complaints.  Patient states she's been sick for the past week. She's had cough, congestion, sinus pressure, sneezing. Cough is dry. Severe. She has associated chest tightness from the cough. She's also had sore throat and postnasal drip. No associated fever. She's been using Mucinex and Claritin without improvement. No known exacerbating factors. No other associated symptoms. No other complaints or concerns at this time.  Social Hx   Social History   Social History  . Marital status: Widowed    Spouse name: N/A  . Number of children: 2  . Years of education: N/A   Social History Main Topics  . Smoking status: Former Smoker    Packs/day: 1.00    Years: 15.00    Types: Cigarettes  . Smokeless tobacco: Never Used  . Alcohol use No  . Drug use: No  . Sexual activity: Not Asked   Other Topics Concern  . None   Social History Narrative   Married and has 2 children, daughters.    Review of Systems  Constitutional: Positive for fatigue.  HENT: Positive for congestion, postnasal drip, sinus pain, sinus pressure and sore throat.   Respiratory: Positive for cough.    Objective:  BP 110/68 (BP Location: Left Arm, Patient Position: Sitting, Cuff Size: Normal)   Pulse 85   Temp 98.6 F (37 C) (Oral)   Resp 18   Wt 155 lb 6 oz (70.5 kg)   LMP 08/09/2012   SpO2 99%   BMI 26.67 kg/m   BP/Weight 02/05/2017 01/29/2017 01/21/9562  Systolic BP 875 643 329  Diastolic BP 68 78 76  Wt. (Lbs) 155.38 156 154.8  BMI 26.67 26.78 27.42   Physical Exam  Constitutional: She is oriented to person, place, and time. She appears well-developed. No distress.  HENT:  Head: Normocephalic and atraumatic.  Mouth/Throat: Oropharynx is clear and moist.  TM's normal.   Cardiovascular: Normal rate and regular rhythm.     Pulmonary/Chest: Effort normal and breath sounds normal. She has no wheezes. She has no rales.  Neurological: She is alert and oriented to person, place, and time.  Psychiatric: She has a normal mood and affect.  Vitals reviewed.   Lab Results  Component Value Date   WBC 4.8 04/18/2016   HGB 13.1 04/18/2016   HCT 39.0 04/18/2016   PLT 251.0 04/18/2016   GLUCOSE 101 (H) 01/29/2017   CHOL 178 04/18/2016   TRIG 60.0 04/18/2016   HDL 75.20 04/18/2016   LDLCALC 90 04/18/2016   ALT 11 01/29/2017   AST 15 01/29/2017   NA 140 01/29/2017   K 4.5 01/29/2017   CL 105 01/29/2017   CREATININE 0.81 01/29/2017   BUN 9 01/29/2017   CO2 29 01/29/2017   TSH 3.90 01/29/2017   HGBA1C 5.8 04/18/2016    Assessment & Plan:   Problem List Items Addressed This Visit    Respiratory infection    New problem. Given the duration of illness, treating empirically with doxycycline. Tussionex for cough.         Meds ordered this encounter  Medications  . doxycycline (VIBRA-TABS) 100 MG tablet    Sig: Take 1 tablet (100 mg total) by mouth 2 (two) times daily.    Dispense:  14 tablet    Refill:  0  . chlorpheniramine-HYDROcodone (TUSSIONEX PENNKINETIC ER) 10-8  MG/5ML SUER    Sig: Take 5 mLs by mouth every 12 (twelve) hours as needed.    Dispense:  115 mL    Refill:  0   Follow-up: PRN  Door

## 2017-02-05 NOTE — Patient Instructions (Signed)
Antibiotic as prescribed.  Take care  Dr.   

## 2017-02-05 NOTE — Assessment & Plan Note (Signed)
New problem. Given the duration of illness, treating empirically with doxycycline. Tussionex for cough.

## 2017-02-06 ENCOUNTER — Ambulatory Visit: Payer: Medicare Other

## 2017-02-09 ENCOUNTER — Ambulatory Visit: Payer: Medicare Other | Admitting: Urology

## 2017-02-25 NOTE — Telephone Encounter (Signed)
Spoke to pt. She would like me to call her sometime in October

## 2017-03-02 ENCOUNTER — Encounter: Payer: Self-pay | Admitting: Internal Medicine

## 2017-03-02 ENCOUNTER — Ambulatory Visit (INDEPENDENT_AMBULATORY_CARE_PROVIDER_SITE_OTHER): Payer: Medicare Other | Admitting: Internal Medicine

## 2017-03-02 VITALS — BP 112/68 | HR 88 | Temp 98.6°F | Resp 12 | Ht 64.0 in | Wt 155.2 lb

## 2017-03-02 DIAGNOSIS — N3010 Interstitial cystitis (chronic) without hematuria: Secondary | ICD-10-CM

## 2017-03-02 DIAGNOSIS — F439 Reaction to severe stress, unspecified: Secondary | ICD-10-CM

## 2017-03-02 DIAGNOSIS — R739 Hyperglycemia, unspecified: Secondary | ICD-10-CM

## 2017-03-02 DIAGNOSIS — E7800 Pure hypercholesterolemia, unspecified: Secondary | ICD-10-CM

## 2017-03-02 DIAGNOSIS — Z87898 Personal history of other specified conditions: Secondary | ICD-10-CM

## 2017-03-02 DIAGNOSIS — K219 Gastro-esophageal reflux disease without esophagitis: Secondary | ICD-10-CM | POA: Diagnosis not present

## 2017-03-02 NOTE — Progress Notes (Signed)
[name], female   DOB: December 09, 1940, 76 y.o.   MRN: [id number]   Subjective:    [name], female    DOB: 1941/01/05, 76 y.o.   MRN: [id number]  HPI  Patient here for a scheduled follow up.  She reports she is doing better.  Feels better.  No abdominal pain.  Bowels moving. Cancelled CT.  Desires no further w/up.  No chest pain.  No sob.  No acid reflux.  Bowels stable.  She saw Dr Lacinda Axon recently.  Diagnosed with URI.  Treated with doxycycline.  Cough and congestion resolved.  Has f/u planned with new urologist 04/28/17.  Overall stable.     Past Medical History:  Diagnosis Date  . Breast screening, unspecified 2013  . Cancer ([place]) 1992   skin  . Degenerative disk disease   . GERD (gastroesophageal reflux disease)   . Hypercholesterolemia 2008  . Interstitial cystitis    followed by Dr Jacqlyn Larsen  . Other sign and symptom in breast 2013   Left upper outer quadrant breast "soreness" Ultrasound exam of right breast in the 2 o'clock position with the breast distracted medially showed a 0.3-0.4 with 0.5 cm simple cyst. In the 1 o'clock position where pt reported tenderness US exam was negatiive.  Because of her history of intermittent nipple drainage, ultrasound was completed of the retroareolar area.  . Personal history of tobacco use, presenting hazards to health   . PONV (postoperative nausea and vomiting)   . Special screening for malignant neoplasms, colon    Past Surgical History:  Procedure Laterality Date  . ABDOMINAL HYSTERECTOMY  1992  . APPENDECTOMY    . CERVICAL DISCECTOMY     S/P C7-T1 discectomy with fusion  . CHOLECYSTECTOMY  1990  . COLONOSCOPY WITH PROPOFOL N/A 02/14/2016   Procedure: COLONOSCOPY WITH PROPOFOL;  Surgeon: Lucilla Lame, MD;  Location: [facility];  Service: Endoscopy;  Laterality: N/A;  PT WOULD LIKE 10 ARRIVAL TIME OR LATER  . DILATION AND CURETTAGE OF UTERUS    . ESOPHAGOGASTRODUODENOSCOPY (EGD) WITH PROPOFOL N/A  02/14/2016   Procedure: ESOPHAGOGASTRODUODENOSCOPY (EGD) WITH PROPOFOL with dialtion;  Surgeon: Lucilla Lame, MD;  Location: [facility];  Service: Endoscopy;  Laterality: N/A;  . MELANOMA EXCISION     removed from Left calf 1994   Family History  Problem Relation Age of Onset  . Hodgkin's lymphoma Mother   . Heart failure Father   . Heart attack Father   . Arthritis Sister        Three sisters w/ degeneratve disk disease  . Headache Sister        Two sisters hx of headache  . Breast cancer Neg Hx   . Colon cancer Neg Hx   . Bladder Cancer Neg Hx   . Kidney cancer Neg Hx    Social History   Social History  . Marital status: Widowed    Spouse name: N/A  . Number of children: 2  . Years of education: N/A   Social History Main Topics  . Smoking status: Former Smoker    Packs/day: 1.00    Years: 15.00    Types: Cigarettes  . Smokeless tobacco: Never Used  . Alcohol use No  . Drug use: No  . Sexual activity: Not Asked   Other Topics Concern  . None   Social History Narrative   Married and has 2 children, daughters.    Outpatient Encounter Prescriptions as of 03/02/2017  Medication  Sig  . acetaminophen (TYLENOL) 325 MG tablet Take 650 mg by mouth as needed.  . ALPRAZolam (XANAX) 0.25 MG tablet Take 1 tablet (0.25 mg total) by mouth at bedtime as needed for anxiety.  Marland Kitchen aspirin EC 81 MG tablet Take 81 mg by mouth daily.  Marland Kitchen ibuprofen (ADVIL,MOTRIN) 200 MG tablet Take 200 mg by mouth every 6 (six) hours as needed.  . lovastatin (MEVACOR) 20 MG tablet Take 1 tablet (20 mg total) by mouth at bedtime.  . Melatonin 10 MG CAPS Take by mouth.  . Multiple Vitamin (MULTI-VITAMIN DAILY PO) Take 1 tablet by mouth daily.  Marland Kitchen omeprazole (PRILOSEC) 20 MG capsule Take 1 capsule (20 mg total) by mouth 2 (two) times daily before a meal.  . polyethylene glycol (GOLYTELY) 236 g solution Take 4,000 mLs by mouth once. Drink one 8 oz glass every 20 mins until stools are clear  .  [DISCONTINUED] chlorpheniramine-HYDROcodone (TUSSIONEX PENNKINETIC ER) 10-8 MG/5ML SUER Take 5 mLs by mouth every 12 (twelve) hours as needed.  . [DISCONTINUED] doxycycline (VIBRA-TABS) 100 MG tablet Take 1 tablet (100 mg total) by mouth 2 (two) times daily.   No facility-administered encounter medications on file as of 03/02/2017.     Review of Systems  Constitutional: Negative for appetite change and unexpected weight change.  HENT: Negative for congestion and sinus pressure.   Respiratory: Negative for cough, chest tightness and shortness of breath.   Cardiovascular: Negative for chest pain, palpitations and leg swelling.  Gastrointestinal: Negative for abdominal pain, diarrhea, nausea and vomiting.  Musculoskeletal: Negative for back pain and joint swelling.  Skin: Negative for color change and rash.  Neurological: Negative for dizziness, light-headedness and headaches.  Psychiatric/Behavioral: Negative for agitation and dysphoric mood.       Objective:    Physical Exam  Constitutional: She appears well-developed and well-nourished. No distress.  HENT:  Nose: Nose normal.  Mouth/Throat: Oropharynx is clear and moist.  Neck: Neck supple. No thyromegaly present.  Cardiovascular: Normal rate and regular rhythm.   Pulmonary/Chest: Breath sounds normal. No respiratory distress. She has no wheezes.  Abdominal: Soft. Bowel sounds are normal. There is no tenderness.  Musculoskeletal: She exhibits no edema or tenderness.  Lymphadenopathy:    She has no cervical adenopathy.  Skin: No rash noted. No erythema.  Psychiatric: She has a normal mood and affect. Her behavior is normal.    BP 112/68 (BP Location: Left Arm, Patient Position: Sitting, Cuff Size: Normal)   Pulse 88   Temp 98.6 F (37 C) (Oral)   Resp 12   Ht 5\' 4"  (1.626 m)   Wt 155 lb 3.2 oz (70.4 kg)   LMP 08/09/2012   SpO2 96%   BMI 26.64 kg/m  Wt Readings from Last 3 Encounters:  03/02/17 155 lb 3.2 oz (70.4 kg)    02/05/17 155 lb 6 oz (70.5 kg)  01/29/17 156 lb (70.8 kg)     Lab Results  Component Value Date   WBC 4.8 04/18/2016   HGB 13.1 04/18/2016   HCT 39.0 04/18/2016   PLT 251.0 04/18/2016   GLUCOSE 101 (H) 01/29/2017   CHOL 178 04/18/2016   TRIG 60.0 04/18/2016   HDL 75.20 04/18/2016   LDLCALC 90 04/18/2016   ALT 11 01/29/2017   AST 15 01/29/2017   NA 140 01/29/2017   K 4.5 01/29/2017   CL 105 01/29/2017   CREATININE 0.81 01/29/2017   BUN 9 01/29/2017   CO2 29 01/29/2017   TSH 3.90 01/29/2017  HGBA1C 5.8 04/18/2016    Mm Screening Breast Tomo Bilateral  Result Date: 08/18/2016 CLINICAL DATA:  Screening. EXAM: 2D DIGITAL SCREENING BILATERAL MAMMOGRAM WITH CAD AND ADJUNCT TOMO COMPARISON:  None. ACR Breast Density Category b: There are scattered areas of fibroglandular density. FINDINGS: There are no findings suspicious for malignancy. Images were processed with CAD. IMPRESSION: No mammographic evidence of malignancy. A result letter of this screening mammogram will be mailed directly to the patient. RECOMMENDATION: Screening mammogram in one year. (Code:SM-B-01Y) BI-RADS CATEGORY  1: Negative. Electronically Signed   By: Everlean Alstrom M.D.   On: 08/18/2016 07:38       Assessment & Plan:   Problem List Items Addressed This Visit    Chronic interstitial cystitis    Appears to be relatively stable.  Has appt scheduled with urology 04/28/17.       GERD (gastroesophageal reflux disease)    Controlled on omeprazole.        Hypercholesterolemia    On lovastatin.  Low cholesterol diet and exercise.  Follow lipid panel and liver function tests.        Relevant Orders   CBC with Differential/Platelet   Hepatic function panel   Lipid panel   Stress    Doing better.  Follow.         Other Visit Diagnoses    History of abdominal pain    -  Primary   no abdominal pain now.  cancelled CT.     Hyperglycemia       Relevant Orders   Hemoglobin Z7H   Basic metabolic  panel       Einar Pheasant, MD

## 2017-03-04 ENCOUNTER — Encounter: Payer: Self-pay | Admitting: Internal Medicine

## 2017-03-04 NOTE — Assessment & Plan Note (Signed)
Appears to be relatively stable.  Has appt scheduled with urology 04/28/17.

## 2017-03-04 NOTE — Assessment & Plan Note (Signed)
Controlled on omeprazole.   

## 2017-03-04 NOTE — Assessment & Plan Note (Signed)
Doing better.  Follow.   

## 2017-03-04 NOTE — Assessment & Plan Note (Signed)
On lovastatin.  Low cholesterol diet and exercise.  Follow lipid panel and liver function tests.   

## 2017-03-16 ENCOUNTER — Encounter: Payer: Self-pay | Admitting: Urology

## 2017-03-16 ENCOUNTER — Ambulatory Visit (INDEPENDENT_AMBULATORY_CARE_PROVIDER_SITE_OTHER): Payer: Medicare Other | Admitting: Urology

## 2017-03-16 VITALS — BP 124/84 | HR 75 | Ht 64.0 in | Wt 153.2 lb

## 2017-03-16 DIAGNOSIS — R3915 Urgency of urination: Secondary | ICD-10-CM | POA: Diagnosis not present

## 2017-03-16 DIAGNOSIS — N3946 Mixed incontinence: Secondary | ICD-10-CM | POA: Diagnosis not present

## 2017-03-16 LAB — URINALYSIS, COMPLETE
BILIRUBIN UA: NEGATIVE
GLUCOSE, UA: NEGATIVE
Ketones, UA: NEGATIVE
LEUKOCYTES UA: NEGATIVE
Nitrite, UA: NEGATIVE
PH UA: 7 (ref 5.0–7.5)
Protein, UA: NEGATIVE
RBC, UA: NEGATIVE
Specific Gravity, UA: 1.015 (ref 1.005–1.030)
Urobilinogen, Ur: 0.2 mg/dL (ref 0.2–1.0)

## 2017-03-16 LAB — BLADDER SCAN AMB NON-IMAGING: SCAN RESULT: 32

## 2017-03-16 NOTE — Progress Notes (Signed)
In and Out Catheterization  Patient is present today for a I & O catheterization, patient unable to void on her own. Patient was cleaned and prepped in a sterile fashion with betadine and Lidocaine 2% jelly was instilled into the urethra.  A 14FR cath was inserted no complications were noted , 270ml of urine return was noted, urine was yellow  in color. A clean urine sample was collected for UA. Bladder was drained  And catheter was removed with out difficulty.    Preformed by: Fonnie Jarvis, CMA

## 2017-03-16 NOTE — Progress Notes (Signed)
03/16/2017 9:48 AM   Rachael Austin 27-Oct-1940 [id number]  Referring provider: Einar Pheasant, [place] [address] [place], [place] [address]  Chief Complaint  Patient presents with  . Urinary Frequency    HPI: Rachael Austin: The patient was assessed for urgency incontinence. She had a sling 10 years ago. She was given the beta 3 agonist 25 mg. Her residual was normal. She stopped her Tofranil.   Today  The patient was diagnosed to have interstitial cystitis many years ago by Dr. Amalia Hailey but does not get a lot of pain. She no longer wants to drive to visit Dr. Jacqlyn Larsen but this is likely why she was on the Tofranil. She never tried the beta 3 agonist because she was not feeling well at the time  She gets up 4 times a night. She voids every hour during the day with urgency. She wears 2 liners a day. She will leak with urgency if she holds it too long. She will leak with coughing and sneezing that she holds it too long. She has not tried other medications for her bladder. She does not give bladder infection   PMH: Past Medical History:  Diagnosis Date  . Breast screening, unspecified 2013  . Cancer ([place]) 1992   skin  . Degenerative disk disease   . GERD (gastroesophageal reflux disease)   . Hypercholesterolemia 2008  . Interstitial cystitis    followed by Dr Jacqlyn Larsen  . Other sign and symptom in breast 2013   Left upper outer quadrant breast "soreness" Ultrasound exam of right breast in the 2 o'clock position with the breast distracted medially showed a 0.3-0.4 with 0.5 cm simple cyst. In the 1 o'clock position where pt reported tenderness US exam was negatiive.  Because of her history of intermittent nipple drainage, ultrasound was completed of the retroareolar area.  . Personal history of tobacco use, presenting hazards to health   . PONV (postoperative nausea and vomiting)   . Special screening for malignant neoplasms, colon     Surgical History: Past Surgical  History:  Procedure Laterality Date  . ABDOMINAL HYSTERECTOMY  1992  . APPENDECTOMY    . CERVICAL DISCECTOMY     S/P C7-T1 discectomy with fusion  . CHOLECYSTECTOMY  1990  . COLONOSCOPY WITH PROPOFOL N/A 02/14/2016   Procedure: COLONOSCOPY WITH PROPOFOL;  Surgeon: Lucilla Lame, MD;  Location: [facility];  Service: Endoscopy;  Laterality: N/A;  PT WOULD LIKE 10 ARRIVAL TIME OR LATER  . DILATION AND CURETTAGE OF UTERUS    . ESOPHAGOGASTRODUODENOSCOPY (EGD) WITH PROPOFOL N/A 02/14/2016   Procedure: ESOPHAGOGASTRODUODENOSCOPY (EGD) WITH PROPOFOL with dialtion;  Surgeon: Lucilla Lame, MD;  Location: [facility];  Service: Endoscopy;  Laterality: N/A;  . MELANOMA EXCISION     removed from Left calf 1994    Home Medications:  Allergies as of 03/16/2017      Reactions   Augmentin [amoxicillin-pot Clavulanate] Swelling, Rash   Swelling of the lips   Codeine Sulfate Other (See Comments)   "Makes her hyper"      Medication List       Accurate as of 03/16/17  9:48 AM. Always use your most recent med list.          acetaminophen 325 MG tablet Commonly known as:  TYLENOL Take 650 mg by mouth as needed.   aspirin EC 81 MG tablet Take 81 mg by mouth daily.   ibuprofen 200 MG tablet Commonly known as:  ADVIL,MOTRIN Take 200 mg  by mouth every 6 (six) hours as needed.   lovastatin 20 MG tablet Commonly known as:  MEVACOR Take 1 tablet (20 mg total) by mouth at bedtime.   Melatonin 10 MG Caps Take by mouth as needed.   MULTI-VITAMIN DAILY PO Take 1 tablet by mouth daily.   omeprazole 20 MG capsule Commonly known as:  PRILOSEC Take 1 capsule (20 mg total) by mouth 2 (two) times daily before a meal.   polyethylene glycol 236 g solution Commonly known as:  GOLYTELY Take 4,000 mLs by mouth once. Drink one 8 oz glass every 20 mins until stools are clear       Allergies:  Allergies  Allergen Reactions  . Augmentin [Amoxicillin-Pot Clavulanate] Swelling and Rash     Swelling of the lips  . Codeine Sulfate Other (See Comments)    "Makes her hyper"    Family History: Family History  Problem Relation Age of Onset  . Hodgkin's lymphoma Mother   . Heart failure Father   . Heart attack Father   . Arthritis Sister        Three sisters w/ degeneratve disk disease  . Headache Sister        Two sisters hx of headache  . Breast cancer Neg Hx   . Colon cancer Neg Hx   . Bladder Cancer Neg Hx   . Kidney cancer Neg Hx     Social History:  reports that she has quit smoking. Her smoking use included Cigarettes. She has a 15.00 pack-year smoking history. She has never used smokeless tobacco. She reports that she does not drink alcohol or use drugs.  ROS: UROLOGY Frequent Urination?: Yes Hard to postpone urination?: Yes Burning/pain with urination?: No Get up at night to urinate?: Yes Leakage of urine?: Yes Urine stream starts and stops?: No Trouble starting stream?: No Do you have to strain to urinate?: No Blood in urine?: No Urinary tract infection?: No Sexually transmitted disease?: No Injury to kidneys or bladder?: No Painful intercourse?: No Weak stream?: No Currently pregnant?: No Vaginal bleeding?: No Last menstrual period?: n  Gastrointestinal Nausea?: No Vomiting?: No Indigestion/heartburn?: Yes Diarrhea?: No Constipation?: No  Constitutional Fever: No Night sweats?: No Weight loss?: No Fatigue?: No  Skin Skin rash/lesions?: No Itching?: No  Eyes Blurred vision?: No Double vision?: No  Ears/Nose/Throat Sore throat?: No Sinus problems?: Yes  Hematologic/Lymphatic Swollen glands?: No Easy bruising?: No  Cardiovascular Leg swelling?: No Chest pain?: No  Respiratory Cough?: Yes Shortness of breath?: No  Endocrine Excessive thirst?: No  Musculoskeletal Back pain?: No Joint pain?: No  Neurological Headaches?: No Dizziness?: No  Psychologic Depression?: No Anxiety?: No  Physical Exam: BP 124/84 (BP  Location: Left Arm, Patient Position: Sitting, Cuff Size: Large)   Pulse 75   Ht 5\' 4"  (1.626 m)   Wt 69.5 kg (153 lb 3.2 oz)   LMP 08/09/2012   BMI 26.30 kg/m   Constitutional:  Alert and oriented, No acute distress.  Laboratory Data: Lab Results  Component Value Date   WBC 4.8 04/18/2016   HGB 13.1 04/18/2016   HCT 39.0 04/18/2016   MCV 89.0 04/18/2016   PLT 251.0 04/18/2016    Lab Results  Component Value Date   CREATININE 0.81 01/29/2017    No results found for: PSA  No results found for: TESTOSTERONE  Lab Results  Component Value Date   HGBA1C 5.8 04/18/2016    Urinalysis    Component Value Date/Time   APPEARANCEUR Clear 11/20/2016 1452  GLUCOSEU Negative 11/20/2016 1452   BILIRUBINUR Negative 11/20/2016 1452   PROTEINUR Negative 11/20/2016 1452   UROBILINOGEN 2.0 08/18/2016 1023   NITRITE Negative 11/20/2016 1452   LEUKOCYTESUR 1+ (A) 11/20/2016 1452    Pertinent Imaging: none  Assessment & Plan:  Clinically the patient has an overactive bladder. If she does have interstitial cystitis she does not have a lot of pain. Her urine looked excellent today. She has nighttime frequency. DDAVP may be a good option for her in the future. I will see her back in 4-6 weeks on the beta 3 agonists. She is somewhat cost conscious  1. Urgency of urination 2. Mixed incontinence - Urinalysis, Complete - Urine Culture   No Follow-up on file.  Reece Packer, MD  [facility] [address] [place], [place] [address] [phone]

## 2017-03-18 LAB — URINE CULTURE

## 2017-03-20 ENCOUNTER — Telehealth: Payer: Self-pay

## 2017-03-20 MED ORDER — CIPROFLOXACIN HCL 250 MG PO TABS: 250.0000 mg | ORAL_TABLET | Freq: Two times a day (BID) | ORAL | 0 refills | Status: DC

## 2017-03-20 NOTE — Telephone Encounter (Signed)
Called pt. Gave cx results and med instructions per Dr. Matilde Sprang. Verified pharmacy on file. Sent Rx. Patient verbalized understanding.

## 2017-04-15 ENCOUNTER — Ambulatory Visit: Payer: Medicare Other

## 2017-04-20 ENCOUNTER — Ambulatory Visit (INDEPENDENT_AMBULATORY_CARE_PROVIDER_SITE_OTHER): Payer: Medicare Other | Admitting: Urology

## 2017-04-20 ENCOUNTER — Encounter: Payer: Self-pay | Admitting: Urology

## 2017-04-20 VITALS — BP 146/68 | HR 81 | Ht 64.0 in | Wt 155.6 lb

## 2017-04-20 DIAGNOSIS — N3946 Mixed incontinence: Secondary | ICD-10-CM | POA: Diagnosis not present

## 2017-04-20 MED ORDER — SOLIFENACIN SUCCINATE 5 MG PO TABS: 5.0000 mg | ORAL_TABLET | Freq: Every day | ORAL | 11 refills | Status: DC

## 2017-04-20 NOTE — Progress Notes (Signed)
04/20/2017 11:35 AM   Rachael Austin Apr 12, 1941 [id number]  Referring provider: Einar Pheasant, [address] [place], [place] [address]  Chief Complaint  Patient presents with  . Urinary Urgency    HPI: Rachael Austin: The patient was assessed for urgency incontinence. She had a sling 10 years ago. She was given the beta 3 agonist 25 mg. Her residual was normal. She stopped her Tofranil.   Today  The patient was diagnosed to have interstitial cystitis many years ago by Dr. Amalia Hailey but does not get a lot of pain. She no longer wants to drive to visit Dr. Jacqlyn Larsen but this is likely why she was on the Tofranil. She never tried the beta 3 agonist because she was not feeling well at the time  She gets up 4 times a night. She voids every hour during the day with urgency. She wears 2 liners a day. She will leak with urgency if she holds it too long. She will leak with coughing and sneezing that she holds it too long. She has not tried other medications for her bladder. She does not give bladder infection  Clinically the patient has an overactive bladder. If she does have interstitial cystitis she does not have a lot of pain. Her urine looked excellent today. She has nighttime frequency. DDAVP may be a good option for her in the future. I will see her back in 4-6 weeks on the beta 3 agonists. She is somewhat cost conscious  Today Frequency every 2 or 3 hours during the day. The beta 3 agonist did not help. She still gets up 4-6 times a night with no ankle edema. She did not have contraindications to DDAVP  PMH: Past Medical History:  Diagnosis Date  . Breast screening, unspecified 2013  . Cancer ([place]) 1992   skin  . Degenerative disk disease   . GERD (gastroesophageal reflux disease)   . Hypercholesterolemia 2008  . Interstitial cystitis    followed by Dr Jacqlyn Larsen  . Other sign and symptom in breast 2013   Left upper outer quadrant breast "soreness" Ultrasound exam of  right breast in the 2 o'clock position with the breast distracted medially showed a 0.3-0.4 with 0.5 cm simple cyst. In the 1 o'clock position where pt reported tenderness US exam was negatiive.  Because of her history of intermittent nipple drainage, ultrasound was completed of the retroareolar area.  . Personal history of tobacco use, presenting hazards to health   . PONV (postoperative nausea and vomiting)   . Special screening for malignant neoplasms, colon     Surgical History: Past Surgical History:  Procedure Laterality Date  . ABDOMINAL HYSTERECTOMY  1992  . APPENDECTOMY    . CERVICAL DISCECTOMY     S/P C7-T1 discectomy with fusion  . CHOLECYSTECTOMY  1990  . COLONOSCOPY WITH PROPOFOL N/A 02/14/2016   Procedure: COLONOSCOPY WITH PROPOFOL;  Surgeon: Lucilla Lame, MD;  Location: [facility];  Service: Endoscopy;  Laterality: N/A;  PT WOULD LIKE 10 ARRIVAL TIME OR LATER  . DILATION AND CURETTAGE OF UTERUS    . ESOPHAGOGASTRODUODENOSCOPY (EGD) WITH PROPOFOL N/A 02/14/2016   Procedure: ESOPHAGOGASTRODUODENOSCOPY (EGD) WITH PROPOFOL with dialtion;  Surgeon: Lucilla Lame, MD;  Location: [facility];  Service: Endoscopy;  Laterality: N/A;  . MELANOMA EXCISION     removed from Left calf 1994    Home Medications:  Allergies as of 04/20/2017      Reactions   Augmentin [amoxicillin-pot Clavulanate] Swelling, Rash  Swelling of the lips   Codeine Sulfate Other (See Comments)   "Makes her hyper"      Medication List       Accurate as of 04/20/17 11:35 AM. Always use your most recent med list.          acetaminophen 325 MG tablet Commonly known as:  TYLENOL Take 650 mg by mouth as needed.   aspirin EC 81 MG tablet Take 81 mg by mouth daily.   ibuprofen 200 MG tablet Commonly known as:  ADVIL,MOTRIN Take 200 mg by mouth every 6 (six) hours as needed.   lovastatin 20 MG tablet Commonly known as:  MEVACOR Take 1 tablet (20 mg total) by mouth at bedtime.     Melatonin 10 MG Caps Take by mouth as needed.   MULTI-VITAMIN DAILY PO Take 1 tablet by mouth daily.   omeprazole 20 MG capsule Commonly known as:  PRILOSEC Take 1 capsule (20 mg total) by mouth 2 (two) times daily before a meal.   polyethylene glycol 236 g solution Commonly known as:  GOLYTELY Take 4,000 mLs by mouth once. Drink one 8 oz glass every 20 mins until stools are clear   solifenacin 5 MG tablet Commonly known as:  VESICARE Take 1 tablet (5 mg total) by mouth daily.       Allergies:  Allergies  Allergen Reactions  . Augmentin [Amoxicillin-Pot Clavulanate] Swelling and Rash    Swelling of the lips  . Codeine Sulfate Other (See Comments)    "Makes her hyper"    Family History: Family History  Problem Relation Age of Onset  . Hodgkin's lymphoma Mother   . Heart failure Father   . Heart attack Father   . Arthritis Sister        Three sisters w/ degeneratve disk disease  . Headache Sister        Two sisters hx of headache  . Breast cancer Neg Hx   . Colon cancer Neg Hx   . Bladder Cancer Neg Hx   . Kidney cancer Neg Hx     Social History:  reports that she has quit smoking. Her smoking use included Cigarettes. She has a 15.00 pack-year smoking history. She has never used smokeless tobacco. She reports that she does not drink alcohol or use drugs.  ROS: UROLOGY Frequent Urination?: Yes Hard to postpone urination?: Yes Burning/pain with urination?: No Get up at night to urinate?: Yes Leakage of urine?: Yes Urine stream starts and stops?: Yes Trouble starting stream?: Yes Do you have to strain to urinate?: No Blood in urine?: No Urinary tract infection?: No Sexually transmitted disease?: No Injury to kidneys or bladder?: No Painful intercourse?: No Weak stream?: Yes Currently pregnant?: No Vaginal bleeding?: No Last menstrual period?: n  Gastrointestinal Nausea?: No Vomiting?: No Indigestion/heartburn?: Yes Diarrhea?: No Constipation?:  Yes  Constitutional Fever: No Night sweats?: No Weight loss?: No Fatigue?: Yes  Skin Skin rash/lesions?: No Itching?: No  Eyes Blurred vision?: No Double vision?: No  Ears/Nose/Throat Sore throat?: No Sinus problems?: Yes  Hematologic/Lymphatic Swollen glands?: No Easy bruising?: No  Cardiovascular Leg swelling?: No Chest pain?: No  Respiratory Cough?: No Shortness of breath?: No  Endocrine Excessive thirst?: No  Musculoskeletal Back pain?: Yes Joint pain?: No  Neurological Headaches?: No Dizziness?: No  Psychologic Depression?: No Anxiety?: Yes  Physical Exam: BP (!) 146/68 (BP Location: Left Arm, Patient Position: Sitting, Cuff Size: Normal)   Pulse 81   Ht 5\' 4"  (1.626 m)  Wt 155 lb 9.6 oz (70.6 kg)   LMP 08/09/2012   BMI 26.71 kg/m    Laboratory Data: Lab Results  Component Value Date   WBC 4.8 04/18/2016   HGB 13.1 04/18/2016   HCT 39.0 04/18/2016   MCV 89.0 04/18/2016   PLT 251.0 04/18/2016    Lab Results  Component Value Date   CREATININE 0.81 01/29/2017    No results found for: PSA  No results found for: TESTOSTERONE  Lab Results  Component Value Date   HGBA1C 5.8 04/18/2016    Urinalysis    Component Value Date/Time   APPEARANCEUR Clear 03/16/2017 0921   GLUCOSEU Negative 03/16/2017 0921   BILIRUBINUR Negative 03/16/2017 0921   PROTEINUR Negative 03/16/2017 0921   UROBILINOGEN 2.0 08/18/2016 1023   NITRITE Negative 03/16/2017 0921   LEUKOCYTESUR Negative 03/16/2017 0921    Pertinent Imaging: none  Assessment & Plan:  Reassess in 6 weeks on Vesicare 5 mg samples and perception. She did not want DDAVP with pros and cons and risks and black box discussed.  There are no diagnoses linked to this encounter.  No Follow-up on file.  Reece Packer, MD  [facility] [address] [place], [place] [address] [phone]

## 2017-05-11 ENCOUNTER — Telehealth: Payer: Self-pay | Admitting: Internal Medicine

## 2017-05-11 NOTE — Telephone Encounter (Signed)
LVM for pt to call Ebony Hail back at [phone] to confirm AWV at [facility] after labs on 06/12/17

## 2017-05-18 ENCOUNTER — Ambulatory Visit: Payer: Medicare Other

## 2017-05-18 ENCOUNTER — Telehealth: Payer: Self-pay

## 2017-05-18 DIAGNOSIS — N3281 Overactive bladder: Secondary | ICD-10-CM

## 2017-05-18 NOTE — Telephone Encounter (Signed)
According to his note, he wanted her on the Vesicare at follow up.  Go ahead and refill the medication.

## 2017-05-18 NOTE — Telephone Encounter (Signed)
This is a MacDiarmid pt that was started on vesicare 5mg  at her last visit in July. Pt stated that she ran out of medication yesterday. Pt stated that she is not sure if her s/s are better or worse on the medication. Pt stated that she continues to have lower abd pain and some urgency. Pt was unsure if she should get a rx of the medication or come off of it. Please advise.

## 2017-05-19 MED ORDER — SOLIFENACIN SUCCINATE 5 MG PO TABS: 5.0000 mg | ORAL_TABLET | Freq: Every day | ORAL | 11 refills | Status: DC

## 2017-05-19 NOTE — Telephone Encounter (Signed)
Spoke with pt and made aware Dr. Matilde Sprang wanted her on vesicare at f/u. Pt voiced understanding. Medication sent to pharmacy.

## 2017-06-03 ENCOUNTER — Telehealth: Payer: Self-pay

## 2017-06-03 NOTE — Telephone Encounter (Signed)
Pt called stating she has developed an allergic reaction to the vesicare she was taken. Pt stated she has stopped the medication. Pt described allergic reaction to be rash, feeling faint, heart racing, stomach pain, and constipation.

## 2017-06-10 ENCOUNTER — Ambulatory Visit: Payer: Medicare Other

## 2017-06-10 ENCOUNTER — Encounter: Payer: Self-pay | Admitting: Urology

## 2017-06-10 ENCOUNTER — Ambulatory Visit (INDEPENDENT_AMBULATORY_CARE_PROVIDER_SITE_OTHER): Payer: Medicare Other | Admitting: Urology

## 2017-06-10 VITALS — BP 148/81 | HR 85 | Ht 64.0 in | Wt 155.4 lb

## 2017-06-10 DIAGNOSIS — N3020 Other chronic cystitis without hematuria: Secondary | ICD-10-CM

## 2017-06-10 MED ORDER — PENTOSAN POLYSULFATE SODIUM 100 MG PO CAPS: 100.0000 mg | ORAL_CAPSULE | Freq: Two times a day (BID) | ORAL | 11 refills | Status: DC

## 2017-06-10 NOTE — Telephone Encounter (Signed)
confirmed

## 2017-06-10 NOTE — Progress Notes (Signed)
06/10/2017 10:37 AM   Rachael Austin 11/05/1940 [id number]  Referring provider: Einar Pheasant, [address] [place], [place] [address]  Chief Complaint  Patient presents with  . Urinary Incontinence    HPI: Rachael Austin: The patient was assessed for urgency incontinence. She had a sling 10 years ago. She was given the beta 3 agonist 25 mg. Her residual was normal. She stopped her Tofranil.   Today The patient was diagnosed to have interstitial cystitis many years ago by Dr. Amalia Hailey but does not get a lot of pain. She no longer wants to drive to visit Dr. Jacqlyn Larsen but this is likely why she was on the Tofranil. She never tried the beta 3 agonist because she was not feeling well at the time  She gets up 4 times a night. She voids every hour during the day with urgency. She wears 2 liners a day. She will leak with urgency if she holds it too long. She will leak with coughing and sneezing that she holds it too long. She has not tried other medications for her bladder. She does not give bladder infection  Clinically the patient has an overactive bladder. If she does have interstitial cystitis she does not have a lot of pain. Her urine looked excellent today. She has nighttime frequency. DDAVP may be a good option for her in the future. I will see her back in 4-6 weeks on the beta 3 agonists. She is somewhat cost conscious  Last visit Frequency every 2 or 3 hours during the day. The beta 3 agonist did not help. She still gets up 4-6 times a night with no ankle edema. She did not have contraindications to DDAVP  The patient did not want desmopressin last visit and was given Vesicare  Today Frequency is stable  The patient did not respond to [place] and had some CNS side effects. When asked she said a little bit of burning and stinging is the primary symptom especially at night and especially when she eats acidic foods and clinically not infected    PMH: Past  Medical History:  Diagnosis Date  . Breast screening, unspecified 2013  . Cancer ([place]) 1992   skin  . Degenerative disk disease   . GERD (gastroesophageal reflux disease)   . Hypercholesterolemia 2008  . Interstitial cystitis    followed by Dr Jacqlyn Larsen  . Other sign and symptom in breast 2013   Left upper outer quadrant breast "soreness" Ultrasound exam of right breast in the 2 o'clock position with the breast distracted medially showed a 0.3-0.4 with 0.5 cm simple cyst. In the 1 o'clock position where pt reported tenderness US exam was negatiive.  Because of her history of intermittent nipple drainage, ultrasound was completed of the retroareolar area.  . Personal history of tobacco use, presenting hazards to health   . PONV (postoperative nausea and vomiting)   . Special screening for malignant neoplasms, colon     Surgical History: Past Surgical History:  Procedure Laterality Date  . ABDOMINAL HYSTERECTOMY  1992  . APPENDECTOMY    . CERVICAL DISCECTOMY     S/P C7-T1 discectomy with fusion  . CHOLECYSTECTOMY  1990  . COLONOSCOPY WITH PROPOFOL N/A 02/14/2016   Procedure: COLONOSCOPY WITH PROPOFOL;  Surgeon: Lucilla Lame, MD;  Location: [facility];  Service: Endoscopy;  Laterality: N/A;  PT WOULD LIKE 10 ARRIVAL TIME OR LATER  . DILATION AND CURETTAGE OF UTERUS    . ESOPHAGOGASTRODUODENOSCOPY (EGD) WITH PROPOFOL N/A  02/14/2016   Procedure: ESOPHAGOGASTRODUODENOSCOPY (EGD) WITH PROPOFOL with dialtion;  Surgeon: Lucilla Lame, MD;  Location: [facility];  Service: Endoscopy;  Laterality: N/A;  . MELANOMA EXCISION     removed from Left calf 1994    Home Medications:  Allergies as of 06/10/2017      Reactions   Augmentin [amoxicillin-pot Clavulanate] Swelling, Rash   Swelling of the lips   Codeine Sulfate Other (See Comments)   "Makes her hyper"   Vesicare [solifenacin Succinate] Nausea And Vomiting, Rash      Medication List       Accurate as of 06/10/17 10:37 AM.  Always use your most recent med list.          acetaminophen 325 MG tablet Commonly known as:  TYLENOL Take 650 mg by mouth as needed.   aspirin EC 81 MG tablet Take 81 mg by mouth daily.   ibuprofen 200 MG tablet Commonly known as:  ADVIL,MOTRIN Take 200 mg by mouth every 6 (six) hours as needed.   lovastatin 20 MG tablet Commonly known as:  MEVACOR Take 1 tablet (20 mg total) by mouth at bedtime.   Melatonin 10 MG Caps Take by mouth as needed.   MULTI-VITAMIN DAILY PO Take 1 tablet by mouth daily.   omeprazole 20 MG capsule Commonly known as:  PRILOSEC Take 1 capsule (20 mg total) by mouth 2 (two) times daily before a meal.   polyethylene glycol 236 g solution Commonly known as:  GOLYTELY Take 4,000 mLs by mouth once. Drink one 8 oz glass every 20 mins until stools are clear   solifenacin 5 MG tablet Commonly known as:  VESICARE Take 1 tablet (5 mg total) by mouth daily.       Allergies:  Allergies  Allergen Reactions  . Augmentin [Amoxicillin-Pot Clavulanate] Swelling and Rash    Swelling of the lips  . Codeine Sulfate Other (See Comments)    "Makes her hyper"  . Vesicare [Solifenacin Succinate] Nausea And Vomiting and Rash    Family History: Family History  Problem Relation Age of Onset  . Hodgkin's lymphoma Mother   . Heart failure Father   . Heart attack Father   . Arthritis Sister        Three sisters w/ degeneratve disk disease  . Headache Sister        Two sisters hx of headache  . Breast cancer Neg Hx   . Colon cancer Neg Hx   . Bladder Cancer Neg Hx   . Kidney cancer Neg Hx     Social History:  reports that she has quit smoking. Her smoking use included Cigarettes. She has a 15.00 pack-year smoking history. She has never used smokeless tobacco. She reports that she does not drink alcohol or use drugs.  ROS: UROLOGY Frequent Urination?: Yes Hard to postpone urination?: Yes Burning/pain with urination?: No Get up at night to urinate?:  Yes Leakage of urine?: Yes Urine stream starts and stops?: Yes Trouble starting stream?: Yes Do you have to strain to urinate?: Yes Blood in urine?: No Urinary tract infection?: No Sexually transmitted disease?: No Injury to kidneys or bladder?: No Painful intercourse?: No Weak stream?: No Currently pregnant?: No Vaginal bleeding?: No Last menstrual period?: n  Gastrointestinal Nausea?: No Vomiting?: No Indigestion/heartburn?: Yes Diarrhea?: Yes Constipation?: Yes  Constitutional Fever: No Night sweats?: No Weight loss?: No Fatigue?: Yes  Skin Skin rash/lesions?: No Itching?: No  Eyes Blurred vision?: No Double vision?: No  Ears/Nose/Throat Sore throat?:  No Sinus problems?: No  Hematologic/Lymphatic Swollen glands?: No Easy bruising?: No  Cardiovascular Leg swelling?: No Chest pain?: No  Respiratory Cough?: No Shortness of breath?: No  Endocrine Excessive thirst?: No  Musculoskeletal Back pain?: No Joint pain?: No  Neurological Headaches?: No Dizziness?: No  Psychologic Depression?: No Anxiety?: No  Physical Exam: BP (!) 148/81 (BP Location: Left Arm, Patient Position: Sitting, Cuff Size: Normal)   Pulse 85   Ht 5\' 4"  (1.626 m)   Wt 155 lb 6.4 oz (70.5 kg)   LMP 08/09/2012   BMI 26.67 kg/m   Constitutional:  Alert and oriented, No acute distress.   Laboratory Data: Lab Results  Component Value Date   WBC 4.8 04/18/2016   HGB 13.1 04/18/2016   HCT 39.0 04/18/2016   MCV 89.0 04/18/2016   PLT 251.0 04/18/2016    Lab Results  Component Value Date   CREATININE 0.81 01/29/2017    No results found for: PSA  No results found for: TESTOSTERONE  Lab Results  Component Value Date   HGBA1C 5.8 04/18/2016    Urinalysis    Component Value Date/Time   APPEARANCEUR Clear 03/16/2017 0921   GLUCOSEU Negative 03/16/2017 0921   BILIRUBINUR Negative 03/16/2017 0921   PROTEINUR Negative 03/16/2017 0921   UROBILINOGEN 2.0  08/18/2016 1023   NITRITE Negative 03/16/2017 0921   LEUKOCYTESUR Negative 03/16/2017 0921    Pertinent Imaging: none  Assessment & Plan:  The patient does appear to have interstitial cystitis. We gave her an interstitial cystitis diet. I mentioned rescue treatments but she does not have a lot of pain otherwise. Recognizing potential cost she would like to try Elmiron 2 tablets twice today on the stomach. Pros cons the risk was discussed. Reevaluate in 6 months.  There are no diagnoses linked to this encounter.  No Follow-up on file.  Reece Packer, MD  [facility] [address] [place], [place] [address] [phone]

## 2017-06-12 ENCOUNTER — Ambulatory Visit: Payer: Medicare Other

## 2017-06-12 ENCOUNTER — Other Ambulatory Visit: Payer: Medicare Other

## 2017-06-15 ENCOUNTER — Other Ambulatory Visit: Payer: Medicare Other

## 2017-06-15 ENCOUNTER — Encounter: Payer: Self-pay | Admitting: Internal Medicine

## 2017-06-15 ENCOUNTER — Ambulatory Visit (INDEPENDENT_AMBULATORY_CARE_PROVIDER_SITE_OTHER): Payer: Medicare Other | Admitting: Internal Medicine

## 2017-06-15 VITALS — BP 138/84 | HR 74 | Temp 98.6°F | Resp 12 | Ht 64.0 in | Wt 153.6 lb

## 2017-06-15 DIAGNOSIS — F439 Reaction to severe stress, unspecified: Secondary | ICD-10-CM | POA: Diagnosis not present

## 2017-06-15 DIAGNOSIS — Z1231 Encounter for screening mammogram for malignant neoplasm of breast: Secondary | ICD-10-CM

## 2017-06-15 DIAGNOSIS — K6289 Other specified diseases of anus and rectum: Secondary | ICD-10-CM | POA: Diagnosis not present

## 2017-06-15 DIAGNOSIS — R739 Hyperglycemia, unspecified: Secondary | ICD-10-CM | POA: Diagnosis not present

## 2017-06-15 DIAGNOSIS — Z1239 Encounter for other screening for malignant neoplasm of breast: Secondary | ICD-10-CM

## 2017-06-15 DIAGNOSIS — E7800 Pure hypercholesterolemia, unspecified: Secondary | ICD-10-CM

## 2017-06-15 DIAGNOSIS — N3010 Interstitial cystitis (chronic) without hematuria: Secondary | ICD-10-CM

## 2017-06-15 DIAGNOSIS — K219 Gastro-esophageal reflux disease without esophagitis: Secondary | ICD-10-CM

## 2017-06-15 DIAGNOSIS — R109 Unspecified abdominal pain: Secondary | ICD-10-CM | POA: Diagnosis not present

## 2017-06-15 DIAGNOSIS — Z0000 Encounter for general adult medical examination without abnormal findings: Secondary | ICD-10-CM | POA: Diagnosis not present

## 2017-06-15 LAB — HEMOGLOBIN A1C: HEMOGLOBIN A1C: 5.9 % (ref 4.6–6.5)

## 2017-06-15 LAB — AMYLASE: AMYLASE: 22 U/L — AB (ref 27–131)

## 2017-06-15 LAB — LIPID PANEL
CHOLESTEROL: 183 mg/dL (ref 0–200)
HDL: 95.8 mg/dL (ref >39.00)
LDL CALC: 74 mg/dL (ref 0–99)
NonHDL: 87.43
TRIGLYCERIDES: 65 mg/dL (ref 0.0–149.0)
Total CHOL/HDL Ratio: 2
VLDL: 13 mg/dL (ref 0.0–40.0)

## 2017-06-15 LAB — BASIC METABOLIC PANEL
BUN: 8 mg/dL (ref 6–23)
CALCIUM: 9.7 mg/dL (ref 8.4–10.5)
CO2: 26 meq/L (ref 19–32)
Chloride: 102 mEq/L (ref 96–112)
Creatinine, Ser: 0.87 mg/dL (ref 0.40–1.20)
GFR: 67.03 mL/min (ref >60.00)
GLUCOSE: 104 mg/dL — AB (ref 70–99)
POTASSIUM: 4.3 meq/L (ref 3.5–5.1)
SODIUM: 138 meq/L (ref 135–145)

## 2017-06-15 LAB — URINALYSIS, ROUTINE W REFLEX MICROSCOPIC
BILIRUBIN URINE: NEGATIVE
HGB URINE DIPSTICK: NEGATIVE
KETONES UR: NEGATIVE
LEUKOCYTES UA: NEGATIVE
Nitrite: NEGATIVE
PH: 5.5 (ref 5.0–8.0)
RBC / HPF: NONE SEEN (ref 0–?)
Total Protein, Urine: NEGATIVE
UROBILINOGEN UA: 0.2 (ref 0.0–1.0)
Urine Glucose: NEGATIVE
WBC, UA: NONE SEEN (ref 0–?)

## 2017-06-15 LAB — HEPATIC FUNCTION PANEL
ALBUMIN: 4.4 g/dL (ref 3.5–5.2)
ALT: 9 U/L (ref 0–35)
AST: 14 U/L (ref 0–37)
Alkaline Phosphatase: 73 U/L (ref 39–117)
Bilirubin, Direct: 0.1 mg/dL (ref 0.0–0.3)
TOTAL PROTEIN: 6.8 g/dL (ref 6.0–8.3)
Total Bilirubin: 0.6 mg/dL (ref 0.2–1.2)

## 2017-06-15 LAB — CBC WITH DIFFERENTIAL/PLATELET
Basophils Absolute: 0.1 10*3/uL (ref 0.0–0.1)
Basophils Relative: 0.9 % (ref 0.0–3.0)
EOS PCT: 0.9 % (ref 0.0–5.0)
Eosinophils Absolute: 0.1 10*3/uL (ref 0.0–0.7)
HCT: 40.8 % (ref 36.0–46.0)
HEMOGLOBIN: 13.4 g/dL (ref 12.0–15.0)
Lymphocytes Relative: 31.5 % (ref 12.0–46.0)
Lymphs Abs: 2 10*3/uL (ref 0.7–4.0)
MCHC: 32.9 g/dL (ref 30.0–36.0)
MCV: 89.5 fl (ref 78.0–100.0)
MONO ABS: 0.6 10*3/uL (ref 0.1–1.0)
MONOS PCT: 8.8 % (ref 3.0–12.0)
Neutro Abs: 3.7 10*3/uL (ref 1.4–7.7)
Neutrophils Relative %: 57.9 % (ref 43.0–77.0)
Platelets: 235 10*3/uL (ref 150.0–400.0)
RBC: 4.56 Mil/uL (ref 3.87–5.11)
RDW: 14.5 % (ref 11.5–15.5)
WBC: 6.4 10*3/uL (ref 4.0–10.5)

## 2017-06-15 LAB — LIPASE: LIPASE: 22 U/L (ref 11.0–59.0)

## 2017-06-15 MED ORDER — HYDROCORTISONE ACE-PRAMOXINE 1-1 % RE CREA: 1.0000 "application " | TOPICAL_CREAM | Freq: Two times a day (BID) | RECTAL | 0 refills | Status: DC

## 2017-06-15 MED ORDER — PANTOPRAZOLE SODIUM 40 MG PO TBEC: 40.0000 mg | DELAYED_RELEASE_TABLET | Freq: Two times a day (BID) | ORAL | 2 refills | Status: DC

## 2017-06-15 NOTE — Progress Notes (Signed)
[name], female   DOB: June 19, 1941, 76 y.o.   MRN: [id number]   Subjective:    [name], female    DOB: Feb 23, 1941, 76 y.o.   MRN: [id number]  HPI  Patient here for her physical exam.  She has a history of issues with her bladder.  Sees urology.  Was recently placed on vesicare. No response.  Felt worse.  Was recently reevaluated 06/10/17.  Recommended starting on elmiron.  She does report having some stomach pain.  Reports burning and discomfort.  Eating aggravates.  Worse at night.  Some intermittent constipation.  Takes stool softener and miralax.  Yesterday she had two episodes of loose stool.  No persistent diarrhea.  Also reports some itching - rectal area.  Feels irritation up in the rectum.  Some burning.  No vaginal discharge or vaginal itching.  Previously treated for uti.       Past Medical History:  Diagnosis Date  . Breast screening, unspecified 2013  . Cancer ([place]) 1992   skin  . Degenerative disk disease   . GERD (gastroesophageal reflux disease)   . Hypercholesterolemia 2008  . Interstitial cystitis    followed by Dr Jacqlyn Larsen  . Other sign and symptom in breast 2013   Left upper outer quadrant breast "soreness" Ultrasound exam of right breast in the 2 o'clock position with the breast distracted medially showed a 0.3-0.4 with 0.5 cm simple cyst. In the 1 o'clock position where pt reported tenderness US exam was negatiive.  Because of her history of intermittent nipple drainage, ultrasound was completed of the retroareolar area.  . Personal history of tobacco use, presenting hazards to health   . PONV (postoperative nausea and vomiting)   . Special screening for malignant neoplasms, colon    Past Surgical History:  Procedure Laterality Date  . ABDOMINAL HYSTERECTOMY  1992  . APPENDECTOMY    . CERVICAL DISCECTOMY     S/P C7-T1 discectomy with fusion  . CHOLECYSTECTOMY  1990  . COLONOSCOPY WITH PROPOFOL N/A 02/14/2016   Procedure:  COLONOSCOPY WITH PROPOFOL;  Surgeon: Lucilla Lame, MD;  Location: [facility];  Service: Endoscopy;  Laterality: N/A;  PT WOULD LIKE 10 ARRIVAL TIME OR LATER  . DILATION AND CURETTAGE OF UTERUS    . ESOPHAGOGASTRODUODENOSCOPY (EGD) WITH PROPOFOL N/A 02/14/2016   Procedure: ESOPHAGOGASTRODUODENOSCOPY (EGD) WITH PROPOFOL with dialtion;  Surgeon: Lucilla Lame, MD;  Location: [place];  Service: Endoscopy;  Laterality: N/A;  . MELANOMA EXCISION     removed from Left calf 1994   Family History  Problem Relation Age of Onset  . Hodgkin's lymphoma Mother   . Heart failure Father   . Heart attack Father   . Arthritis Sister        Three sisters w/ degeneratve disk disease  . Headache Sister        Two sisters hx of headache  . Breast cancer Neg Hx   . Colon cancer Neg Hx   . Bladder Cancer Neg Hx   . Kidney cancer Neg Hx    Social History   Social History  . Marital status: Widowed    Spouse name: N/A  . Number of children: 2  . Years of education: N/A   Social History Main Topics  . Smoking status: Former Smoker    Packs/day: 1.00    Years: 15.00    Types: Cigarettes  . Smokeless tobacco: Never Used  . Alcohol use No  . Drug  use: No  . Sexual activity: Not Asked   Other Topics Concern  . None   Social History Narrative   Married and has 2 children, daughters.    Outpatient Encounter Prescriptions as of 06/15/2017  Medication Sig  . acetaminophen (TYLENOL) 325 MG tablet Take 650 mg by mouth as needed.  Marland Kitchen aspirin EC 81 MG tablet Take 81 mg by mouth daily.  Marland Kitchen ibuprofen (ADVIL,MOTRIN) 200 MG tablet Take 200 mg by mouth every 6 (six) hours as needed.  . lovastatin (MEVACOR) 20 MG tablet Take 1 tablet (20 mg total) by mouth at bedtime.  . Melatonin 10 MG CAPS Take by mouth as needed.   . Multiple Vitamin (MULTI-VITAMIN DAILY PO) Take 1 tablet by mouth daily.  . pentosan polysulfate (ELMIRON) 100 MG capsule Take 1 capsule (100 mg total) by mouth 2 (two) times  daily.  . [DISCONTINUED] omeprazole (PRILOSEC) 20 MG capsule Take 1 capsule (20 mg total) by mouth 2 (two) times daily before a meal.  . [DISCONTINUED] solifenacin (VESICARE) 5 MG tablet Take 1 tablet (5 mg total) by mouth daily.  . pantoprazole (PROTONIX) 40 MG tablet Take 1 tablet (40 mg total) by mouth 2 (two) times daily before a meal.  . polyethylene glycol (GOLYTELY) 236 g solution Take 4,000 mLs by mouth once. Drink one 8 oz glass every 20 mins until stools are clear (Patient not taking: Reported on 06/15/2017)  . pramoxine-hydrocortisone (ANALPRAM-HC) 1-1 % rectal cream Place 1 application rectally 2 (two) times daily.  . [DISCONTINUED] pantoprazole (PROTONIX) 40 MG tablet Take 1 tablet (40 mg total) by mouth 2 (two) times daily before a meal.   No facility-administered encounter medications on file as of 06/15/2017.     Review of Systems  Constitutional: Negative for appetite change and unexpected weight change.  HENT: Negative for congestion and sinus pressure.   Eyes: Negative for pain and visual disturbance.  Respiratory: Negative for cough, chest tightness and shortness of breath.   Cardiovascular: Negative for chest pain, palpitations and leg swelling.  Gastrointestinal: Positive for abdominal pain. Negative for nausea and vomiting.       Intermittent constipation.  Had two episodes of loose stool yesterday.  No persistent diarrhea.   Genitourinary: Negative for difficulty urinating and dysuria.       Rectal irritation as outlined.   Musculoskeletal: Negative for back pain and joint swelling.  Skin: Negative for color change and rash.  Neurological: Negative for dizziness, light-headedness and headaches.  Hematological: Negative for adenopathy. Does not bruise/bleed easily.  Psychiatric/Behavioral: Negative for agitation and dysphoric mood.       Objective:    Physical Exam  Constitutional: She is oriented to person, place, and time. She appears well-developed and  well-nourished. No distress.  HENT:  Nose: Nose normal.  Mouth/Throat: Oropharynx is clear and moist.  Eyes: Right eye exhibits no discharge. Left eye exhibits no discharge. No scleral icterus.  Neck: Neck supple. No thyromegaly present.  Cardiovascular: Normal rate and regular rhythm.   Pulmonary/Chest: Breath sounds normal. No accessory muscle usage. No tachypnea. No respiratory distress. She has no decreased breath sounds. She has no wheezes. She has no rhonchi. Right breast exhibits no inverted nipple, no mass, no nipple discharge and no tenderness (no axillary adenopathy). Left breast exhibits no inverted nipple, no mass, no nipple discharge and no tenderness (no axilarry adenopathy).  Abdominal: Soft. Bowel sounds are normal. There is no tenderness.  Genitourinary:  Genitourinary Comments: Declines vaginal exam.  Rectal exam -  minimal peri rectal irritation.  Heme negative.  No mass palpated.    Musculoskeletal: She exhibits no edema or tenderness.  Lymphadenopathy:    She has no cervical adenopathy.  Neurological: She is alert and oriented to person, place, and time.  Skin: Skin is warm. No rash noted. No erythema.  Psychiatric: She has a normal mood and affect. Her behavior is normal.    BP 138/84   Pulse 74   Temp 98.6 F (37 C) (Oral)   Resp 12   Ht 5\' 4"  (1.626 m)   Wt 153 lb 9.6 oz (69.7 kg)   LMP 08/09/2012   SpO2 95%   BMI 26.37 kg/m  Wt Readings from Last 3 Encounters:  06/15/17 153 lb 9.6 oz (69.7 kg)  06/10/17 155 lb 6.4 oz (70.5 kg)  04/20/17 155 lb 9.6 oz (70.6 kg)     Lab Results  Component Value Date   WBC 6.4 06/15/2017   HGB 13.4 06/15/2017   HCT 40.8 06/15/2017   PLT 235.0 06/15/2017   GLUCOSE 104 (H) 06/15/2017   CHOL 183 06/15/2017   TRIG 65.0 06/15/2017   HDL 95.80 06/15/2017   LDLCALC 74 06/15/2017   ALT 9 06/15/2017   AST 14 06/15/2017   NA 138 06/15/2017   K 4.3 06/15/2017   CL 102 06/15/2017   CREATININE 0.87 06/15/2017   BUN 8  06/15/2017   CO2 26 06/15/2017   TSH 3.90 01/29/2017   HGBA1C 5.9 06/15/2017    Mm Screening Breast Tomo Bilateral  Result Date: 08/18/2016 CLINICAL DATA:  Screening. EXAM: 2D DIGITAL SCREENING BILATERAL MAMMOGRAM WITH CAD AND ADJUNCT TOMO COMPARISON:  None. ACR Breast Density Category b: There are scattered areas of fibroglandular density. FINDINGS: There are no findings suspicious for malignancy. Images were processed with CAD. IMPRESSION: No mammographic evidence of malignancy. A result letter of this screening mammogram will be mailed directly to the patient. RECOMMENDATION: Screening mammogram in one year. (Code:SM-B-01Y) BI-RADS CATEGORY  1: Negative. Electronically Signed   By: Everlean Alstrom M.D.   On: 08/18/2016 07:38       Assessment & Plan:   Problem List Items Addressed This Visit    Abdominal pain - Primary    Abdominal pain and discomfort as outlined.  Change to protonix.  Obtain labs.  Check CT scan.  May need GI referral.        Relevant Orders   Amylase (Completed)   Lipase (Completed)   Urinalysis, Routine w reflex microscopic (Completed)   Urine Culture (Completed)   CT Abdomen Pelvis W Contrast   Chronic interstitial cystitis    Recent evaluation by urology.  Recommended for her to start on elmiron.  Continue f/u with urology.       GERD (gastroesophageal reflux disease)    With symptoms as outlined.  Describes burning, etc.  Will stop prilosec.  Start protonix.  Obtain CT scan as outlined.  May need GI evaluation.  Check lipase, amylase, liver panel and metabolic panel.        Relevant Medications   pantoprazole (PROTONIX) 40 MG tablet   Health care maintenance    Physical today 06/15/17.  S/p hysterectomy.  Mammogram 07/2016 - birads I.  Colonoscopy 02/14/16 - diverticulosis and non bleeding internal hemorrhoids.  Schedule f/u mammogram.       Hypercholesterolemia    On lovastatin.  Low cholesterol diet and exercise.  Follow lipid panel and liver  function tests.        Stress  Overall appears to be stable.         Other Visit Diagnoses    Screening for breast cancer       Relevant Orders   MM DIGITAL SCREENING BILATERAL   Hyperglycemia       Rectal irritation       rectal irritation and exam as outlined.  analpram as directed.  follow.         Einar Pheasant, MD

## 2017-06-15 NOTE — Assessment & Plan Note (Addendum)
Physical today 06/15/17.  S/p hysterectomy.  Mammogram 07/2016 - birads I.  Colonoscopy 02/14/16 - diverticulosis and non bleeding internal hemorrhoids.  Schedule f/u mammogram.

## 2017-06-16 ENCOUNTER — Telehealth: Payer: Self-pay | Admitting: Internal Medicine

## 2017-06-16 LAB — URINE CULTURE
MICRO NUMBER:: 81023963
Result:: NO GROWTH
SPECIMEN QUALITY:: ADEQUATE

## 2017-06-16 NOTE — Telephone Encounter (Signed)
Declined AWV  °

## 2017-06-18 ENCOUNTER — Telehealth: Payer: Self-pay | Admitting: Internal Medicine

## 2017-06-18 ENCOUNTER — Encounter: Payer: Self-pay | Admitting: Internal Medicine

## 2017-06-18 NOTE — Assessment & Plan Note (Signed)
With symptoms as outlined.  Describes burning, etc.  Will stop prilosec.  Start protonix.  Obtain CT scan as outlined.  May need GI evaluation.  Check lipase, amylase, liver panel and metabolic panel.

## 2017-06-18 NOTE — Telephone Encounter (Signed)
Pt declined AWV. °

## 2017-06-18 NOTE — Assessment & Plan Note (Signed)
Recent evaluation by urology.  Recommended for her to start on elmiron.  Continue f/u with urology.

## 2017-06-18 NOTE — Telephone Encounter (Signed)
See lab note.  

## 2017-06-18 NOTE — Assessment & Plan Note (Signed)
Abdominal pain and discomfort as outlined.  Change to protonix.  Obtain labs.  Check CT scan.  May need GI referral.

## 2017-06-18 NOTE — Assessment & Plan Note (Signed)
Overall appears to be stable.

## 2017-06-18 NOTE — Telephone Encounter (Signed)
Pt called back returning your call. Please advise, thank you!  Call pt @ [phone]

## 2017-06-18 NOTE — Assessment & Plan Note (Signed)
On lovastatin.  Low cholesterol diet and exercise.  Follow lipid panel and liver function tests.   

## 2017-06-18 NOTE — Assessment & Plan Note (Signed)
Has been on omeprazole.  Now with episodes of increased burning in her stomach and discomfort.  Stop omeprazole.  Start protonix 40mg  bid.  Pursue CT scan as outlined.  May need GI referral.

## 2017-07-01 ENCOUNTER — Ambulatory Visit
Admission: RE | Admit: 2017-07-01 | Discharge: 2017-07-01 | Disposition: A | Discharge status: Home / Self Care | Payer: Medicare Other | Source: Ambulatory Visit | Attending: Internal Medicine | Admitting: Internal Medicine

## 2017-07-01 DIAGNOSIS — I700 Atherosclerosis of aorta: Secondary | ICD-10-CM | POA: Insufficient documentation

## 2017-07-01 DIAGNOSIS — R109 Unspecified abdominal pain: Secondary | ICD-10-CM | POA: Diagnosis not present

## 2017-07-01 DIAGNOSIS — Z90710 Acquired absence of both cervix and uterus: Secondary | ICD-10-CM | POA: Insufficient documentation

## 2017-07-01 DIAGNOSIS — Z9049 Acquired absence of other specified parts of digestive tract: Secondary | ICD-10-CM | POA: Insufficient documentation

## 2017-07-01 MED ORDER — IOPAMIDOL (ISOVUE-300) INJECTION 61%
100.0000 mL | Freq: Once | INTRAVENOUS | Status: AC | PRN
Start: 2017-07-01 — End: 2017-07-01
  Administered 2017-07-01: 100 mL via INTRAVENOUS

## 2017-07-02 ENCOUNTER — Encounter: Payer: Self-pay | Admitting: Internal Medicine

## 2017-07-02 DIAGNOSIS — I700 Atherosclerosis of aorta: Secondary | ICD-10-CM | POA: Insufficient documentation

## 2017-07-03 ENCOUNTER — Other Ambulatory Visit: Payer: Self-pay | Admitting: Internal Medicine

## 2017-07-03 DIAGNOSIS — R109 Unspecified abdominal pain: Secondary | ICD-10-CM

## 2017-07-03 NOTE — Progress Notes (Signed)
Order placed for GI referral.   

## 2017-07-27 ENCOUNTER — Encounter: Payer: Self-pay | Admitting: Urology

## 2017-07-27 ENCOUNTER — Ambulatory Visit (INDEPENDENT_AMBULATORY_CARE_PROVIDER_SITE_OTHER): Payer: Medicare Other | Admitting: Urology

## 2017-07-27 VITALS — BP 173/82 | HR 77 | Ht 64.0 in | Wt 153.0 lb

## 2017-07-27 DIAGNOSIS — N3020 Other chronic cystitis without hematuria: Secondary | ICD-10-CM | POA: Diagnosis not present

## 2017-07-27 LAB — URINALYSIS, COMPLETE
Bilirubin, UA: NEGATIVE
Glucose, UA: NEGATIVE
KETONES UA: NEGATIVE
NITRITE UA: NEGATIVE
Protein, UA: NEGATIVE
RBC, UA: NEGATIVE
Urobilinogen, Ur: 0.2 mg/dL (ref 0.2–1.0)
pH, UA: 7 (ref 5.0–7.5)

## 2017-07-27 LAB — MICROSCOPIC EXAMINATION
RBC MICROSCOPIC, UA: NONE SEEN /HPF (ref 0–?)
WBC, UA: NONE SEEN /hpf (ref 0–?)

## 2017-07-27 NOTE — Progress Notes (Signed)
07/27/2017 10:38 AM   Rachael Austin March 07, 1941 [id number]  Referring provider: Einar Pheasant, MD [address] [place], [place] [address]  Chief Complaint  Patient presents with  . Follow-up    medication management   HPI The patient was diagnosed to have interstitial cystitis many years ago by Dr. Amalia Hailey but does not get a lot of pain. She no longer wants to drive to visit Dr. Jacqlyn Larsen but this is likely why she was on the Tofranil.   She gets up 4 times a night. She voids every hour during the day with urgency. She wears 2 liners a day. She will leak with urgency if she holds it too long. She will leak with coughing and sneezing that she holds it too long. She has not tried other medications for her bladder. She does not give bladder infection  Clinically the patient has an overactive bladder. If she does have interstitial cystitis she does not have a lot of pain. Her urine looked excellent today.She is somewhat cost conscious  Last visit Frequency every 2 or 3 hours during the day. The beta 3 agonist did not help. She still gets up 4-6 times a night with no ankle edema. She did not have contraindications to DDAVP The patient did not respond to [place]a and had some CNS side effects. When asked she said a little bit of burning and stinging is the primary symptom especially at night and especially when she eats acidic foods and clinically not infected   The patient does appear to have interstitial cystitis. We gave her an interstitial cystitis diet. I mentioned rescue treatments but she does not have a lot of pain. Recognizing potential cost she would like to try Elmiron 2 tablets twice today on the stomach. Reevaluate in 6 months.  Today The patient has been on the [facility] on for about 2 months with no relief.  She is now having suprapubic pain.  When I looked that Dr. Bjorn Loser notes from last year and earlier it appears he had her on imipramine but was really  managing urgency incontinence.  I did not see where he mentioned she had interstitial cystitis but it was diagnosed by Dr. Amalia Hailey before.  The patient feels her symptoms are worsening and the abdominal discomfort is bothering her as is the nighttime frequency getting up as many as 6 times.  She thinks it is getting worse.  When asked she says the abdominal discomfort is there chronically.  Modifying factors: There are no other modifying factors  Associated signs and symptoms: There are no other associated signs and symptoms Aggravating and relieving factors: There are no other aggravating or relieving factors Severity: Moderate Duration: Persistent     PMH: Past Medical History:  Diagnosis Date  . Breast screening, unspecified 2013  . Cancer ([place]) 1992   skin  . Degenerative disk disease   . GERD (gastroesophageal reflux disease)   . Hypercholesterolemia 2008  . Interstitial cystitis    followed by Dr Jacqlyn Larsen  . Other sign and symptom in breast 2013   Left upper outer quadrant breast "soreness" Ultrasound exam of right breast in the 2 o'clock position with the breast distracted medially showed a 0.3-0.4 with 0.5 cm simple cyst. In the 1 o'clock position where pt reported tenderness US exam was negatiive.  Because of her history of intermittent nipple drainage, ultrasound was completed of the retroareolar area.  . Personal history of tobacco use, presenting hazards to health   . PONV (  postoperative nausea and vomiting)   . Special screening for malignant neoplasms, colon     Surgical History: Past Surgical History:  Procedure Laterality Date  . ABDOMINAL HYSTERECTOMY  1992  . APPENDECTOMY    . CERVICAL DISCECTOMY     S/P C7-T1 discectomy with fusion  . CHOLECYSTECTOMY  1990  . COLONOSCOPY WITH PROPOFOL N/A 02/14/2016   Procedure: COLONOSCOPY WITH PROPOFOL;  Surgeon: Lucilla Lame, MD;  Location: [facility];  Service: Endoscopy;  Laterality: N/A;  PT WOULD LIKE 10 ARRIVAL TIME  OR LATER  . DILATION AND CURETTAGE OF UTERUS    . ESOPHAGOGASTRODUODENOSCOPY (EGD) WITH PROPOFOL N/A 02/14/2016   Procedure: ESOPHAGOGASTRODUODENOSCOPY (EGD) WITH PROPOFOL with dialtion;  Surgeon: Lucilla Lame, MD;  Location: [facility];  Service: Endoscopy;  Laterality: N/A;  . MELANOMA EXCISION     removed from Left calf 1994    Home Medications:  Allergies as of 07/27/2017      Reactions   Augmentin [amoxicillin-pot Clavulanate] Swelling, Rash   Swelling of the lips   Codeine Sulfate Other (See Comments)   "Makes her hyper"   Vesicare [solifenacin Succinate] Nausea And Vomiting, Rash      Medication List       Accurate as of 07/27/17 10:38 AM. Always use your most recent med list.          acetaminophen 325 MG tablet Commonly known as:  TYLENOL Take 650 mg by mouth as needed.   aspirin EC 81 MG tablet Take 81 mg by mouth daily.   ibuprofen 200 MG tablet Commonly known as:  ADVIL,MOTRIN Take 200 mg by mouth every 6 (six) hours as needed.   lovastatin 20 MG tablet Commonly known as:  MEVACOR Take 1 tablet (20 mg total) by mouth at bedtime.   Melatonin 10 MG Caps Take by mouth as needed.   MULTI-VITAMIN DAILY PO Take 1 tablet by mouth daily.   pantoprazole 40 MG tablet Commonly known as:  PROTONIX Take 1 tablet (40 mg total) by mouth 2 (two) times daily before a meal.   pentosan polysulfate 100 MG capsule Commonly known as:  ELMIRON Take 1 capsule (100 mg total) by mouth 2 (two) times daily.   polyethylene glycol 236 g solution Commonly known as:  GOLYTELY Take 4,000 mLs by mouth once. Drink one 8 oz glass every 20 mins until stools are clear   pramoxine-hydrocortisone 1-1 % rectal cream Commonly known as:  ANALPRAM-HC Place 1 application rectally 2 (two) times daily.       Allergies:  Allergies  Allergen Reactions  . Augmentin [Amoxicillin-Pot Clavulanate] Swelling and Rash    Swelling of the lips  . Codeine Sulfate Other (See Comments)      "Makes her hyper"  . Vesicare [Solifenacin Succinate] Nausea And Vomiting and Rash    Family History: Family History  Problem Relation Age of Onset  . Hodgkin's lymphoma Mother   . Heart failure Father   . Heart attack Father   . Arthritis Sister        Three sisters w/ degeneratve disk disease  . Headache Sister        Two sisters hx of headache  . Breast cancer Neg Hx   . Colon cancer Neg Hx   . Bladder Cancer Neg Hx   . Kidney cancer Neg Hx     Social History:  reports that she has quit smoking. Her smoking use included Cigarettes. She has a 15.00 pack-year smoking history. She has never used  smokeless tobacco. She reports that she does not drink alcohol or use drugs.  ROS:                                        Physical Exam: BP (!) 173/82 (BP Location: Right Arm, Patient Position: Sitting, Cuff Size: Normal)   Pulse 77   Ht 5\' 4"  (1.626 m)   Wt 153 lb (69.4 kg)   LMP 08/09/2012   BMI 26.26 kg/m   Constitutional:  Alert and oriented, No acute distress.   Laboratory Data: Lab Results  Component Value Date   WBC 6.4 06/15/2017   HGB 13.4 06/15/2017   HCT 40.8 06/15/2017   MCV 89.5 06/15/2017   PLT 235.0 06/15/2017    Lab Results  Component Value Date   CREATININE 0.87 06/15/2017    No results found for: PSA  No results found for: TESTOSTERONE  Lab Results  Component Value Date   HGBA1C 5.9 06/15/2017    Urinalysis    Component Value Date/Time   COLORURINE YELLOW 06/15/2017 1417   APPEARANCEUR CLEAR 06/15/2017 1417   APPEARANCEUR Clear 03/16/2017 0921   LABSPEC <=1.005 (A) 06/15/2017 1417   PHURINE 5.5 06/15/2017 1417   GLUCOSEU NEGATIVE 06/15/2017 1417   HGBUR NEGATIVE 06/15/2017 1417   BILIRUBINUR NEGATIVE 06/15/2017 1417   BILIRUBINUR Negative 03/16/2017 0921   KETONESUR NEGATIVE 06/15/2017 1417   PROTEINUR Negative 03/16/2017 0921   UROBILINOGEN 0.2 06/15/2017 1417   NITRITE NEGATIVE 06/15/2017 1417    LEUKOCYTESUR NEGATIVE 06/15/2017 1417   LEUKOCYTESUR Negative 03/16/2017 0921    Pertinent Imaging: As noted  Assessment & Plan:  She remembers treatment options discussed previously and I am not sure if I will be able to reach her treatment goal.  She understands that it is difficult sometimes to take over other person's care.  The role of hydrodistention was discussed.  Importantly in October 2018 she had a normal CT scan of the abdomen.  In September she had a normal urine culture  The patient appears to be nonspecific at times with her complaints and when offered treatments tends not to want to do them.  I empathize with her cost concerns as well  She understands that she probably or may have interstitial cystitis.  We are not come to proceed with a hydrodistention.  My first recommendation is installation treatments hoping it would help pain and frequency at night.  I was very polite to her and her family but I did speak to the fact that she tends to not say yes to recommendations when she may have a difficult to treat problem.  This compounds one another.  I will reassess the medication in 4 months.  She will call me if she would like instillations.  In a very polite way I told her and her family that I would not be repeating her counseling from today a third or fourth time  There are no diagnoses linked to this encounter.  No Follow-up on file.  Reece Packer, MD  [facility] [address] [place], [place] [address] [phone]

## 2017-07-30 LAB — CULTURE, URINE COMPREHENSIVE

## 2017-08-10 ENCOUNTER — Ambulatory Visit: Payer: Medicare Other | Admitting: Internal Medicine

## 2017-08-10 ENCOUNTER — Other Ambulatory Visit: Payer: Self-pay | Admitting: Internal Medicine

## 2017-08-14 ENCOUNTER — Encounter: Payer: Self-pay | Admitting: Internal Medicine

## 2017-08-14 ENCOUNTER — Ambulatory Visit (INDEPENDENT_AMBULATORY_CARE_PROVIDER_SITE_OTHER): Payer: Medicare Other | Admitting: Internal Medicine

## 2017-08-14 VITALS — BP 136/78 | HR 62 | Temp 98.6°F | Resp 14 | Wt 156.4 lb

## 2017-08-14 DIAGNOSIS — N3010 Interstitial cystitis (chronic) without hematuria: Secondary | ICD-10-CM | POA: Diagnosis not present

## 2017-08-14 DIAGNOSIS — R030 Elevated blood-pressure reading, without diagnosis of hypertension: Secondary | ICD-10-CM

## 2017-08-14 DIAGNOSIS — F439 Reaction to severe stress, unspecified: Secondary | ICD-10-CM

## 2017-08-14 DIAGNOSIS — E7800 Pure hypercholesterolemia, unspecified: Secondary | ICD-10-CM

## 2017-08-14 DIAGNOSIS — I700 Atherosclerosis of aorta: Secondary | ICD-10-CM

## 2017-08-14 DIAGNOSIS — R739 Hyperglycemia, unspecified: Secondary | ICD-10-CM

## 2017-08-14 DIAGNOSIS — K219 Gastro-esophageal reflux disease without esophagitis: Secondary | ICD-10-CM

## 2017-08-14 MED ORDER — TIZANIDINE HCL 4 MG PO TABS: 4.0000 mg | ORAL_TABLET | Freq: Every evening | ORAL | 0 refills | Status: DC | PRN

## 2017-08-14 NOTE — Progress Notes (Signed)
[name], female   DOB: [birth year], 76 y.o.   MRN: [id number]   Subjective:    [name], female    DOB: Aug 04, 1941, 76 y.o.   MRN: [id number]  HPI  Patient here for a scheduled follow up.  She has had problems with persistent bladder issues.  Has been seeing Dr Jacqlyn Larsen.  Was having issues with getting to [facility].  Saw urology here in town. No changes made.  She is on elmiron.  Still with urinary issues.  States other than her bladder, she feels she is doing relatively well.  Saw GI.  Note reviewed.  Recommended citrucel and f/u in 6 months.  States bowels are doing relatively well.  Eating and drinking.  No nausea or vomiting.  No chest pain.  No sob.  Handling stress.  States blood pressures averaging 122-130s/60s.    Past Medical History:  Diagnosis Date  . Breast screening, unspecified 2013  . Cancer ([place]) 1992   skin  . Degenerative disk disease   . GERD (gastroesophageal reflux disease)   . Hypercholesterolemia 2008  . Interstitial cystitis    followed by Dr Jacqlyn Larsen  . Other sign and symptom in breast 2013   Left upper outer quadrant breast "soreness" Ultrasound exam of right breast in the 2 o'clock position with the breast distracted medially showed a 0.3-0.4 with 0.5 cm simple cyst. In the 1 o'clock position where pt reported tenderness US exam was negatiive.  Because of her history of intermittent nipple drainage, ultrasound was completed of the retroareolar area.  . Personal history of tobacco use, presenting hazards to health   . PONV (postoperative nausea and vomiting)   . Special screening for malignant neoplasms, colon    Past Surgical History:  Procedure Laterality Date  . ABDOMINAL HYSTERECTOMY  1992  . APPENDECTOMY    . CERVICAL DISCECTOMY     S/P C7-T1 discectomy with fusion  . CHOLECYSTECTOMY  1990  . COLONOSCOPY WITH PROPOFOL N/A 02/14/2016   Performed by Lucilla Lame, MD at [place]  . DILATION AND CURETTAGE OF  UTERUS    . ESOPHAGOGASTRODUODENOSCOPY (EGD) WITH PROPOFOL with dialtion N/A 02/14/2016   Performed by Lucilla Lame, MD at [place]  . MELANOMA EXCISION     removed from Left calf 1994   Family History  Problem Relation Age of Onset  . Hodgkin's lymphoma Mother   . Heart failure Father   . Heart attack Father   . Arthritis Sister        Three sisters w/ degeneratve disk disease  . Headache Sister        Two sisters hx of headache  . Breast cancer Neg Hx   . Colon cancer Neg Hx   . Bladder Cancer Neg Hx   . Kidney cancer Neg Hx    Social History   Socioeconomic History  . Marital status: Widowed    Spouse name: None  . Number of children: 2  . Years of education: None  . Highest education level: None  Social Needs  . Financial resource strain: None  . Food insecurity - worry: None  . Food insecurity - inability: None  . Transportation needs - medical: None  . Transportation needs - non-medical: None  Occupational History  . None  Tobacco Use  . Smoking status: Former Smoker    Packs/day: 1.00    Years: 15.00    Pack years: 15.00    Types: Cigarettes  .  Smokeless tobacco: Never Used  Substance and Sexual Activity  . Alcohol use: No    Alcohol/week: 0.0 oz  . Drug use: No  . Sexual activity: None  Other Topics Concern  . None  Social History Narrative   Married and has 2 children, daughters.    Outpatient Encounter Medications as of 08/14/2017  Medication Sig  . acetaminophen (TYLENOL) 325 MG tablet Take 650 mg by mouth as needed.  Marland Kitchen aspirin EC 81 MG tablet Take 81 mg by mouth daily.  Marland Kitchen ibuprofen (ADVIL,MOTRIN) 200 MG tablet Take 200 mg by mouth every 6 (six) hours as needed.  . lovastatin (MEVACOR) 20 MG tablet TAKE 1 TABLET BY MOUTH AT BEDTIME  . Melatonin 10 MG CAPS Take by mouth as needed.   . Multiple Vitamin (MULTI-VITAMIN DAILY PO) Take 1 tablet by mouth daily.  . pantoprazole (PROTONIX) 40 MG tablet Take 1 tablet (40 mg total) by mouth 2  (two) times daily before a meal.  . pentosan polysulfate (ELMIRON) 100 MG capsule Take 1 capsule (100 mg total) by mouth 2 (two) times daily.  . polyethylene glycol (GOLYTELY) 236 g solution Take 4,000 mLs by mouth once. Drink one 8 oz glass every 20 mins until stools are clear  . pramoxine-hydrocortisone (ANALPRAM-HC) 1-1 % rectal cream Place 1 application rectally 2 (two) times daily.  Marland Kitchen tiZANidine (ZANAFLEX) 4 MG tablet Take 1 tablet (4 mg total) at bedtime as needed by mouth for muscle spasms.   No facility-administered encounter medications on file as of 08/14/2017.     Review of Systems  Constitutional: Negative for appetite change and unexpected weight change.  HENT: Negative for congestion and sinus pressure.   Respiratory: Negative for cough, chest tightness and shortness of breath.   Cardiovascular: Negative for chest pain, palpitations and leg swelling.  Gastrointestinal: Negative for abdominal pain, diarrhea and nausea.  Genitourinary: Negative for vaginal discharge.       Urinary and bladder issues as outlined.    Musculoskeletal: Negative for joint swelling and myalgias.  Skin: Negative for color change and rash.  Neurological: Negative for dizziness, light-headedness and headaches.  Psychiatric/Behavioral: Negative for agitation and dysphoric mood.       Objective:     Blood pressure rechecked by me:  136/78  Physical Exam  Constitutional: She appears well-developed and well-nourished. No distress.  HENT:  Nose: Nose normal.  Mouth/Throat: Oropharynx is clear and moist.  Neck: Neck supple. No thyromegaly present.  Cardiovascular: Normal rate and regular rhythm.  Pulmonary/Chest: Breath sounds normal. No respiratory distress. She has no wheezes.  Abdominal: Soft. Bowel sounds are normal. There is no tenderness.  Musculoskeletal: She exhibits no edema or tenderness.  Lymphadenopathy:    She has no cervical adenopathy.  Skin: No rash noted. No erythema.    Psychiatric: She has a normal mood and affect. Her behavior is normal.    BP 136/78   Pulse 62   Temp 98.6 F (37 C) (Oral)   Resp 14   Wt 156 lb 6.4 oz (70.9 kg)   LMP 08/09/2012   SpO2 97%   BMI 26.85 kg/m  Wt Readings from Last 3 Encounters:  08/14/17 156 lb 6.4 oz (70.9 kg)  07/27/17 153 lb (69.4 kg)  06/15/17 153 lb 9.6 oz (69.7 kg)     Lab Results  Component Value Date   WBC 6.4 06/15/2017   HGB 13.4 06/15/2017   HCT 40.8 06/15/2017   PLT 235.0 06/15/2017   GLUCOSE 104 (H) 06/15/2017  CHOL 183 06/15/2017   TRIG 65.0 06/15/2017   HDL 95.80 06/15/2017   LDLCALC 74 06/15/2017   ALT 9 06/15/2017   AST 14 06/15/2017   NA 138 06/15/2017   K 4.3 06/15/2017   CL 102 06/15/2017   CREATININE 0.87 06/15/2017   BUN 8 06/15/2017   CO2 26 06/15/2017   TSH 3.90 01/29/2017   HGBA1C 5.9 06/15/2017    Ct Abdomen Pelvis W Contrast  Result Date: 07/01/2017 CLINICAL DATA:  Pt c/o Mid upper abdominal pain and states it's worse after eating x 3 months. She also c/o intermittent pelvic pain with constipation x 3 months. NKI Hx Melanoma. Hx Cholecystectomy, Appendectomy and total hysterectomy. EXAM: CT ABDOMEN AND PELVIS WITH CONTRAST TECHNIQUE: Multidetector CT imaging of the abdomen and pelvis was performed using the standard protocol following bolus administration of intravenous contrast. CONTRAST:  130mL ISOVUE-300 IOPAMIDOL (ISOVUE-300) INJECTION 61% COMPARISON:  Ultrasound, 12/09/2010 FINDINGS: Lower chest: Lung bases are clear.  Heart normal in size. Hepatobiliary: No focal liver abnormality is seen. Status post cholecystectomy. No biliary dilatation. Pancreas: Unremarkable. No pancreatic ductal dilatation or surrounding inflammatory changes. Spleen: Normal in size without focal abnormality. Adrenals/Urinary Tract: No adrenal masses. There is a vague area of mildly decreased attenuation from the upper pole the left kidney with thinning of the overlying cortex. This is likely a  chronic area of prior scarring. There is no defined mass. Kidneys are otherwise unremarkable. There are no intrarenal stones. No hydronephrosis. Ureters are normal course and caliber. Bladder is unremarkable. Stomach/Bowel: Stomach is unremarkable. Small bowel is normal in caliber with no wall thickening or inflammation. Colon is normal in caliber. There is no colonic wall thickening or inflammation. No significant increase in stool. Vascular/Lymphatic: Aortic atherosclerosis. No enlarged abdominal or pelvic lymph nodes. Reproductive: Status post hysterectomy. No adnexal masses. Other: No abdominal wall hernia or abnormality. No abdominopelvic ascites. Musculoskeletal: No fracture or acute finding. No osteoblastic or osteolytic lesions. IMPRESSION: 1. No acute findings. No findings to account for the patient's abdominal pain. 2. No bowel wall thickening or inflammation. No significant increase in colonic stool. 3. Status post cholecystectomy, appendectomy and hysterectomy. 4. Aortic atherosclerosis. Electronically Signed   By: Lajean Manes M.D.   On: 07/01/2017 12:27       Assessment & Plan:   Problem List Items Addressed This Visit    Aortic atherosclerosis (Hodgkins)    On lovastatin.       Chronic interstitial cystitis    Has been followed by Dr Jacqlyn Larsen.  Recently evaluated by urology here in town.  States was having difficulty with getting to [place].  Plans to return for f/u with Dr Jacqlyn Larsen.  She is calling to schedule f/u.  On elmiron.        Elevated blood pressure reading    Blood pressure recheck improved.  Follow.        GERD (gastroesophageal reflux disease)    On protonix.  Stable.        Hypercholesterolemia    On lovastatin.  Low cholesterol diet and exercise.  Follow lipid panel and liver function tests.        Relevant Orders   Hepatic function panel   Lipid panel   Stress    Stable.  Does not feel needs any further intervention.  Follow.        Other Visit Diagnoses     Hyperglycemia    -  Primary   Relevant Orders   Hemoglobin B2W   Basic metabolic  panel       Einar Pheasant, MD

## 2017-08-16 ENCOUNTER — Encounter: Payer: Self-pay | Admitting: Internal Medicine

## 2017-08-16 NOTE — Assessment & Plan Note (Signed)
Stable.  Does not feel needs any further intervention.  Follow.   

## 2017-08-16 NOTE — Assessment & Plan Note (Signed)
On lovastatin.  Low cholesterol diet and exercise.  Follow lipid panel and liver function tests.   

## 2017-08-16 NOTE — Assessment & Plan Note (Signed)
Has been followed by Dr Jacqlyn Larsen.  Recently evaluated by urology here in town.  States was having difficulty with getting to [place].  Plans to return for f/u with Dr Jacqlyn Larsen.  She is calling to schedule f/u.  On elmiron.

## 2017-08-16 NOTE — Assessment & Plan Note (Signed)
On lovastatin 

## 2017-08-16 NOTE — Assessment & Plan Note (Signed)
On protonix.  Stable.  

## 2017-08-16 NOTE — Assessment & Plan Note (Signed)
Blood pressure recheck improved.  Follow.

## 2017-08-18 ENCOUNTER — Ambulatory Visit
Admission: RE | Admit: 2017-08-18 | Discharge: 2017-08-18 | Disposition: A | Discharge status: Home / Self Care | Payer: Medicare Other | Source: Ambulatory Visit | Attending: Internal Medicine | Admitting: Internal Medicine

## 2017-08-18 ENCOUNTER — Other Ambulatory Visit: Payer: Self-pay | Admitting: Internal Medicine

## 2017-08-18 DIAGNOSIS — Z1231 Encounter for screening mammogram for malignant neoplasm of breast: Secondary | ICD-10-CM | POA: Insufficient documentation

## 2017-08-18 DIAGNOSIS — Z1239 Encounter for other screening for malignant neoplasm of breast: Secondary | ICD-10-CM

## 2017-09-30 ENCOUNTER — Telehealth: Payer: Self-pay | Admitting: Internal Medicine

## 2017-09-30 NOTE — Telephone Encounter (Signed)
Copied from [place] [id number]. Topic: Quick Communication - See Telephone Encounter >> Sep 30, 2017  1:24 PM Synthia Innocent wrote: CRM for notification. See Telephone encounter for:  Patient having chest and head congestion with a cough, would like something called in. Unable to come in for a appointment. [facility] [address] Please advise 09/30/17.

## 2017-09-30 NOTE — Telephone Encounter (Signed)
Patient scheduled for tomorrow

## 2017-10-01 ENCOUNTER — Encounter: Payer: Self-pay | Admitting: Internal Medicine

## 2017-10-01 ENCOUNTER — Ambulatory Visit (INDEPENDENT_AMBULATORY_CARE_PROVIDER_SITE_OTHER): Payer: Medicare Other

## 2017-10-01 ENCOUNTER — Ambulatory Visit: Payer: Medicare Other | Admitting: Internal Medicine

## 2017-10-01 VITALS — BP 146/80 | HR 71 | Temp 98.7°F | Ht 64.0 in | Wt 156.8 lb

## 2017-10-01 DIAGNOSIS — J069 Acute upper respiratory infection, unspecified: Secondary | ICD-10-CM | POA: Diagnosis not present

## 2017-10-01 DIAGNOSIS — R059 Cough, unspecified: Secondary | ICD-10-CM

## 2017-10-01 DIAGNOSIS — R0789 Other chest pain: Secondary | ICD-10-CM

## 2017-10-01 DIAGNOSIS — R05 Cough: Secondary | ICD-10-CM | POA: Diagnosis not present

## 2017-10-01 DIAGNOSIS — R030 Elevated blood-pressure reading, without diagnosis of hypertension: Secondary | ICD-10-CM

## 2017-10-01 DIAGNOSIS — K219 Gastro-esophageal reflux disease without esophagitis: Secondary | ICD-10-CM

## 2017-10-01 MED ORDER — AZITHROMYCIN 250 MG PO TABS: ORAL_TABLET | ORAL | 0 refills | Status: DC

## 2017-10-01 MED ORDER — PREDNISONE 10 MG PO TABS: ORAL_TABLET | ORAL | 0 refills | Status: DC

## 2017-10-01 NOTE — Patient Instructions (Addendum)
Take mucinex DM in the am and robitussin DM in the evening.    Saline nasal spray - flush nose at least 2-3x/day  nasacort nasal spray - 2 sprays each nostril one time per day.  Do this in the evening.    Take a probiotic while you are on antibiotics and for two weeks after completing antibiotics.    Take the prednisone taper as directed.

## 2017-10-01 NOTE — Progress Notes (Signed)
[name], female   DOB: [birth year], 77 y.o.   MRN: [id number]   Subjective:    [name], female    DOB: 09-Aug-1941, 77 y.o.   MRN: [id number]  HPI  Patient here as a work in with concerns regarding congestion and cough.  She is accompanied by her daughter.  History obtained from both of them.  States symptoms started over 10 days ago.  States she has had increased cough and nasal congestion - productive of colored mucus - green mucus.  No fever. Some wheezing.  Also has noticed  Heaviness in her chest.  No acid reflux.  Ears stopped up at times.  Taking a cough syrup, alka seltzer plus.  Symptoms progressing.  She is eating.  No vomiting and no diarrhea.  Coughing fits.     Past Medical History:  Diagnosis Date  . Breast screening, unspecified 2013  . Cancer ([place]) 1992   skin  . Degenerative disk disease   . GERD (gastroesophageal reflux disease)   . Hypercholesterolemia 2008  . Interstitial cystitis    followed by Dr Jacqlyn Larsen  . Other sign and symptom in breast 2013   Left upper outer quadrant breast "soreness" Ultrasound exam of right breast in the 2 o'clock position with the breast distracted medially showed a 0.3-0.4 with 0.5 cm simple cyst. In the 1 o'clock position where pt reported tenderness US exam was negatiive.  Because of her history of intermittent nipple drainage, ultrasound was completed of the retroareolar area.  . Personal history of tobacco use, presenting hazards to health   . PONV (postoperative nausea and vomiting)   . Special screening for malignant neoplasms, colon    Past Surgical History:  Procedure Laterality Date  . ABDOMINAL HYSTERECTOMY  1992  . APPENDECTOMY    . CERVICAL DISCECTOMY     S/P C7-T1 discectomy with fusion  . CHOLECYSTECTOMY  1990  . COLONOSCOPY WITH PROPOFOL N/A 02/14/2016   Procedure: COLONOSCOPY WITH PROPOFOL;  Surgeon: Lucilla Lame, MD;  Location: [place];  Service: Endoscopy;  Laterality:  N/A;  PT WOULD LIKE 10 ARRIVAL TIME OR LATER  . DILATION AND CURETTAGE OF UTERUS    . ESOPHAGOGASTRODUODENOSCOPY (EGD) WITH PROPOFOL N/A 02/14/2016   Procedure: ESOPHAGOGASTRODUODENOSCOPY (EGD) WITH PROPOFOL with dialtion;  Surgeon: Lucilla Lame, MD;  Location: [facility];  Service: Endoscopy;  Laterality: N/A;  . MELANOMA EXCISION     removed from Left calf 1994   Family History  Problem Relation Age of Onset  . Hodgkin's lymphoma Mother   . Heart failure Father   . Heart attack Father   . Arthritis Sister        Three sisters w/ degeneratve disk disease  . Headache Sister        Two sisters hx of headache  . Breast cancer Neg Hx   . Colon cancer Neg Hx   . Bladder Cancer Neg Hx   . Kidney cancer Neg Hx    Social History   Socioeconomic History  . Marital status: Widowed    Spouse name: None  . Number of children: 2  . Years of education: None  . Highest education level: None  Social Needs  . Financial resource strain: None  . Food insecurity - worry: None  . Food insecurity - inability: None  . Transportation needs - medical: None  . Transportation needs - non-medical: None  Occupational History  . None  Tobacco Use  . Smoking status:  Former Smoker    Packs/day: 1.00    Years: 15.00    Pack years: 15.00    Types: Cigarettes  . Smokeless tobacco: Never Used  Substance and Sexual Activity  . Alcohol use: No    Alcohol/week: 0.0 oz  . Drug use: No  . Sexual activity: None  Other Topics Concern  . None  Social History Narrative   Married and has 2 children, daughters.    Outpatient Encounter Medications as of 10/01/2017  Medication Sig  . acetaminophen (TYLENOL) 325 MG tablet Take 650 mg by mouth as needed.  Marland Kitchen aspirin EC 81 MG tablet Take 81 mg by mouth daily.  Marland Kitchen ibuprofen (ADVIL,MOTRIN) 200 MG tablet Take 200 mg by mouth every 6 (six) hours as needed.  . lovastatin (MEVACOR) 20 MG tablet TAKE 1 TABLET BY MOUTH AT BEDTIME  . Melatonin 10 MG CAPS Take by  mouth as needed.   . Multiple Vitamin (MULTI-VITAMIN DAILY PO) Take 1 tablet by mouth daily.  . pantoprazole (PROTONIX) 40 MG tablet Take 1 tablet (40 mg total) by mouth 2 (two) times daily before a meal.  . pentosan polysulfate (ELMIRON) 100 MG capsule Take 1 capsule (100 mg total) by mouth 2 (two) times daily.  Marland Kitchen tiZANidine (ZANAFLEX) 4 MG tablet Take 1 tablet (4 mg total) at bedtime as needed by mouth for muscle spasms.  Marland Kitchen azithromycin (ZITHROMAX) 250 MG tablet Take 2 tablets x 1 day and then one tablet per day for four more days  . predniSONE (DELTASONE) 10 MG tablet Take 4 tablets x 1 day and then decrease by 1/2 tablet per day until down to zero mg.  . [DISCONTINUED] polyethylene glycol (GOLYTELY) 236 g solution Take 4,000 mLs by mouth once. Drink one 8 oz glass every 20 mins until stools are clear  . [DISCONTINUED] pramoxine-hydrocortisone (ANALPRAM-HC) 1-1 % rectal cream Place 1 application rectally 2 (two) times daily.   No facility-administered encounter medications on file as of 10/01/2017.     Review of Systems  Constitutional: Negative for fever and unexpected weight change.  HENT: Positive for congestion, postnasal drip and sinus pressure.   Respiratory: Positive for cough, chest tightness and wheezing.   Cardiovascular: Negative for chest pain, palpitations and leg swelling.  Gastrointestinal: Negative for abdominal pain, diarrhea, nausea and vomiting.  Skin: Negative for color change and rash.  Neurological: Negative for dizziness, light-headedness and headaches.  Psychiatric/Behavioral: Negative for agitation and dysphoric mood.       Objective:    Physical Exam  Constitutional: She appears well-developed and well-nourished. No distress.  HENT:  Mouth/Throat: Oropharynx is clear and moist.  Nares:  Slightly erythematous turbinates.  TMs without erythema.    Neck: Neck supple.  Cardiovascular: Normal rate and regular rhythm.  Pulmonary/Chest: Breath sounds normal. No  respiratory distress.  Increased cough with forced expiration.    Musculoskeletal: She exhibits no edema or tenderness.  Lymphadenopathy:    She has no cervical adenopathy.  Skin: No rash noted. No erythema.    BP (!) 146/80   Pulse 71   Temp 98.7 F (37.1 C) (Oral)   Ht 5\' 4"  (1.626 m)   Wt 156 lb 12.8 oz (71.1 kg)   LMP 08/09/2012   SpO2 97%   BMI 26.91 kg/m  Wt Readings from Last 3 Encounters:  10/01/17 156 lb 12.8 oz (71.1 kg)  08/14/17 156 lb 6.4 oz (70.9 kg)  07/27/17 153 lb (69.4 kg)     Lab Results  Component Value  Date   WBC 6.4 06/15/2017   HGB 13.4 06/15/2017   HCT 40.8 06/15/2017   PLT 235.0 06/15/2017   GLUCOSE 104 (H) 06/15/2017   CHOL 183 06/15/2017   TRIG 65.0 06/15/2017   HDL 95.80 06/15/2017   LDLCALC 74 06/15/2017   ALT 9 06/15/2017   AST 14 06/15/2017   NA 138 06/15/2017   K 4.3 06/15/2017   CL 102 06/15/2017   CREATININE 0.87 06/15/2017   BUN 8 06/15/2017   CO2 26 06/15/2017   TSH 3.90 01/29/2017   HGBA1C 5.9 06/15/2017    Mm Screening Breast Tomo Bilateral  Result Date: 08/18/2017 CLINICAL DATA:  Screening. EXAM: 2D DIGITAL SCREENING BILATERAL MAMMOGRAM WITH CAD AND ADJUNCT TOMO COMPARISON:  Previous exam(s). ACR Breast Density Category b: There are scattered areas of fibroglandular density. FINDINGS: There are no findings suspicious for malignancy. Images were processed with CAD. IMPRESSION: No mammographic evidence of malignancy. A result letter of this screening mammogram will be mailed directly to the patient. RECOMMENDATION: Screening mammogram in one year. (Code:SM-B-01Y) BI-RADS CATEGORY  1: Negative. Electronically Signed   By: Fidela Salisbury M.D.   On: 08/18/2017 15:43       Assessment & Plan:   Problem List Items Addressed This Visit    Acid reflux    Controlled.  On protonix now.       Chest tightness - Primary    EKG - SR with RBBB.  No old EKG for comparison.  Requested EKG from [place].  Discussed with her and  her daughter today.  Tightness started with her cough and congestion.  Treat infection.  Follow.  Hold on further cardiac w/up at this time.  Knows to be reevaluated if symptoms change/worsen.  Check cxr.       Relevant Orders   EKG 12-Lead (Completed)   DG Chest 2 View (Completed)   Elevated blood pressure reading    Elevated today, but she is sick and has been taking otc decongestants.  Will stop the decongestants.  Treat infection.  Follow pressures.  Hold on additional medication.  Follow.        URI (upper respiratory infection)    Persistent worsening symptoms.  Concern over bacterial infection.  Check cxr.  Treat with zpak.  Also prednisone taper as directed.  Saline nasal spray and nasacort nasal spray as directed.  Robitussin DM as outlined.  Take probiotic as instructed.        Relevant Medications   azithromycin (ZITHROMAX) 250 MG tablet    Other Visit Diagnoses    Cough       Relevant Orders   DG Chest 2 View (Completed)       Einar Pheasant, MD

## 2017-10-01 NOTE — Progress Notes (Signed)
Pre visit review using our clinic review tool, if applicable. No additional management support is needed unless otherwise documented below in the visit note. 

## 2017-10-04 ENCOUNTER — Encounter: Payer: Self-pay | Admitting: Internal Medicine

## 2017-10-04 DIAGNOSIS — R0789 Other chest pain: Secondary | ICD-10-CM | POA: Insufficient documentation

## 2017-10-04 NOTE — Assessment & Plan Note (Addendum)
EKG - SR with RBBB.  No old EKG for comparison.  Requested EKG from [place].  Discussed with her and her daughter today.  Tightness started with her cough and congestion.  Treat infection.  Follow.  Hold on further cardiac w/up at this time.  Knows to be reevaluated if symptoms change/worsen.  Check cxr.

## 2017-10-04 NOTE — Assessment & Plan Note (Signed)
Elevated today, but she is sick and has been taking otc decongestants.  Will stop the decongestants.  Treat infection.  Follow pressures.  Hold on additional medication.  Follow.

## 2017-10-04 NOTE — Assessment & Plan Note (Signed)
Persistent worsening symptoms.  Concern over bacterial infection.  Check cxr.  Treat with zpak.  Also prednisone taper as directed.  Saline nasal spray and nasacort nasal spray as directed.  Robitussin DM as outlined.  Take probiotic as instructed.

## 2017-10-04 NOTE — Assessment & Plan Note (Signed)
Controlled.  On protonix now.

## 2017-10-29 ENCOUNTER — Other Ambulatory Visit: Payer: Self-pay | Admitting: Internal Medicine

## 2017-11-25 ENCOUNTER — Other Ambulatory Visit (INDEPENDENT_AMBULATORY_CARE_PROVIDER_SITE_OTHER): Payer: Medicare Other

## 2017-11-25 DIAGNOSIS — E7800 Pure hypercholesterolemia, unspecified: Secondary | ICD-10-CM | POA: Diagnosis not present

## 2017-11-25 DIAGNOSIS — R739 Hyperglycemia, unspecified: Secondary | ICD-10-CM

## 2017-11-25 LAB — LIPID PANEL
CHOL/HDL RATIO: 2
CHOLESTEROL: 180 mg/dL (ref 0–200)
HDL: 76.3 mg/dL (ref >39.00)
LDL Cholesterol: 91 mg/dL (ref 0–99)
NONHDL: 103.88
Triglycerides: 64 mg/dL (ref 0.0–149.0)
VLDL: 12.8 mg/dL (ref 0.0–40.0)

## 2017-11-25 LAB — BASIC METABOLIC PANEL
BUN: 8 mg/dL (ref 6–23)
CHLORIDE: 106 meq/L (ref 96–112)
CO2: 29 meq/L (ref 19–32)
CREATININE: 0.9 mg/dL (ref 0.40–1.20)
Calcium: 9.6 mg/dL (ref 8.4–10.5)
GFR: 64.38 mL/min (ref >60.00)
GLUCOSE: 99 mg/dL (ref 70–99)
POTASSIUM: 4.1 meq/L (ref 3.5–5.1)
Sodium: 141 mEq/L (ref 135–145)

## 2017-11-25 LAB — HEPATIC FUNCTION PANEL
ALT: 8 U/L (ref 0–35)
AST: 13 U/L (ref 0–37)
Albumin: 4.2 g/dL (ref 3.5–5.2)
Alkaline Phosphatase: 61 U/L (ref 39–117)
BILIRUBIN TOTAL: 0.7 mg/dL (ref 0.2–1.2)
Bilirubin, Direct: 0.1 mg/dL (ref 0.0–0.3)
Total Protein: 6.8 g/dL (ref 6.0–8.3)

## 2017-11-25 LAB — HEMOGLOBIN A1C: HEMOGLOBIN A1C: 5.8 % (ref 4.6–6.5)

## 2017-11-26 ENCOUNTER — Ambulatory Visit (INDEPENDENT_AMBULATORY_CARE_PROVIDER_SITE_OTHER): Payer: Medicare Other | Admitting: Internal Medicine

## 2017-11-26 ENCOUNTER — Encounter: Payer: Self-pay | Admitting: Internal Medicine

## 2017-11-26 DIAGNOSIS — F439 Reaction to severe stress, unspecified: Secondary | ICD-10-CM | POA: Diagnosis not present

## 2017-11-26 DIAGNOSIS — N3010 Interstitial cystitis (chronic) without hematuria: Secondary | ICD-10-CM

## 2017-11-26 DIAGNOSIS — R030 Elevated blood-pressure reading, without diagnosis of hypertension: Secondary | ICD-10-CM

## 2017-11-26 DIAGNOSIS — R109 Unspecified abdominal pain: Secondary | ICD-10-CM

## 2017-11-26 DIAGNOSIS — E7800 Pure hypercholesterolemia, unspecified: Secondary | ICD-10-CM

## 2017-11-26 DIAGNOSIS — I700 Atherosclerosis of aorta: Secondary | ICD-10-CM | POA: Diagnosis not present

## 2017-11-26 DIAGNOSIS — R3989 Other symptoms and signs involving the genitourinary system: Secondary | ICD-10-CM

## 2017-11-26 DIAGNOSIS — K219 Gastro-esophageal reflux disease without esophagitis: Secondary | ICD-10-CM

## 2017-11-26 DIAGNOSIS — R739 Hyperglycemia, unspecified: Secondary | ICD-10-CM

## 2017-11-26 LAB — URINALYSIS, ROUTINE W REFLEX MICROSCOPIC
BILIRUBIN URINE: NEGATIVE
HGB URINE DIPSTICK: NEGATIVE
Ketones, ur: NEGATIVE
LEUKOCYTES UA: NEGATIVE
NITRITE: NEGATIVE
RBC / HPF: NONE SEEN (ref 0–?)
Specific Gravity, Urine: <=1.005 — AB (ref 1.000–1.030)
Total Protein, Urine: NEGATIVE
Urine Glucose: NEGATIVE
Urobilinogen, UA: 0.2 (ref 0.0–1.0)
WBC UA: NONE SEEN (ref 0–?)
pH: 7 (ref 5.0–8.0)

## 2017-11-26 NOTE — Patient Instructions (Signed)
Restart citrucel  Zantac (ranitidine) 150mg  - take one tablet 30 minutes before your evening meal

## 2017-11-26 NOTE — Progress Notes (Signed)
[name], female   DOB: 1941-04-29, 77 y.o.   MRN: [id number]   Subjective:    [name], female    DOB: 06-16-1941, 77 y.o.   MRN: [id number]  HPI  Patient here for a scheduled follow up.  She is off all bladder medication now.  Still with spasm .  Going to the bathroom - eases.  Has f/u appt with Dr Jacqlyn Larsen 12/2017.  Wants urine checked today to confirm no infection.  She also is still having abdominal and bowel issues.  Is on protonix.  Has some epigastric discomfort and persistent intermittent bloating.  Some loose stool.  May only occur one time per day, but describes as an explosion.  On probiotic.  Saw GI.  Note reviewed.  No blood.  No vomiting.  Eating.     Past Medical History:  Diagnosis Date  . Breast screening, unspecified 2013  . Cancer ([place]) 1992   skin  . Degenerative disk disease   . GERD (gastroesophageal reflux disease)   . Hypercholesterolemia 2008  . Interstitial cystitis    followed by Dr Jacqlyn Larsen  . Other sign and symptom in breast 2013   Left upper outer quadrant breast "soreness" Ultrasound exam of right breast in the 2 o'clock position with the breast distracted medially showed a 0.3-0.4 with 0.5 cm simple cyst. In the 1 o'clock position where pt reported tenderness US exam was negatiive.  Because of her history of intermittent nipple drainage, ultrasound was completed of the retroareolar area.  . Personal history of tobacco use, presenting hazards to health   . PONV (postoperative nausea and vomiting)   . Special screening for malignant neoplasms, colon    Past Surgical History:  Procedure Laterality Date  . ABDOMINAL HYSTERECTOMY  1992  . APPENDECTOMY    . CERVICAL DISCECTOMY     S/P C7-T1 discectomy with fusion  . CHOLECYSTECTOMY  1990  . COLONOSCOPY WITH PROPOFOL N/A 02/14/2016   Procedure: COLONOSCOPY WITH PROPOFOL;  Surgeon: Lucilla Lame, MD;  Location: [facility];  Service: Endoscopy;  Laterality: N/A;  PT  WOULD LIKE 10 ARRIVAL TIME OR LATER  . DILATION AND CURETTAGE OF UTERUS    . ESOPHAGOGASTRODUODENOSCOPY (EGD) WITH PROPOFOL N/A 02/14/2016   Procedure: ESOPHAGOGASTRODUODENOSCOPY (EGD) WITH PROPOFOL with dialtion;  Surgeon: Lucilla Lame, MD;  Location: [facility];  Service: Endoscopy;  Laterality: N/A;  . MELANOMA EXCISION     removed from Left calf 1994   Family History  Problem Relation Age of Onset  . Hodgkin's lymphoma Mother   . Heart failure Father   . Heart attack Father   . Arthritis Sister        Three sisters w/ degeneratve disk disease  . Headache Sister        Two sisters hx of headache  . Breast cancer Neg Hx   . Colon cancer Neg Hx   . Bladder Cancer Neg Hx   . Kidney cancer Neg Hx    Social History   Socioeconomic History  . Marital status: Widowed    Spouse name: None  . Number of children: 2  . Years of education: None  . Highest education level: None  Social Needs  . Financial resource strain: None  . Food insecurity - worry: None  . Food insecurity - inability: None  . Transportation needs - medical: None  . Transportation needs - non-medical: None  Occupational History  . None  Tobacco Use  . Smoking  status: Former Smoker    Packs/day: 1.00    Years: 15.00    Pack years: 15.00    Types: Cigarettes  . Smokeless tobacco: Never Used  Substance and Sexual Activity  . Alcohol use: No    Alcohol/week: 0.0 oz  . Drug use: No  . Sexual activity: None  Other Topics Concern  . None  Social History Narrative   Married and has 2 children, daughters.    Outpatient Encounter Medications as of 11/26/2017  Medication Sig  . acetaminophen (TYLENOL) 325 MG tablet Take 650 mg by mouth as needed.  Marland Kitchen aspirin EC 81 MG tablet Take 81 mg by mouth daily.  Marland Kitchen azithromycin (ZITHROMAX) 250 MG tablet Take 2 tablets x 1 day and then one tablet per day for four more days  . ibuprofen (ADVIL,MOTRIN) 200 MG tablet Take 200 mg by mouth every 6 (six) hours as needed.   . lovastatin (MEVACOR) 20 MG tablet TAKE 1 TABLET BY MOUTH AT BEDTIME  . Melatonin 10 MG CAPS Take by mouth as needed.   . Multiple Vitamin (MULTI-VITAMIN DAILY PO) Take 1 tablet by mouth daily.  . pantoprazole (PROTONIX) 40 MG tablet TAKE 1 TABLET BY MOUTH TWICE DAILY BEFORE  A  MEAL  . pentosan polysulfate (ELMIRON) 100 MG capsule Take 1 capsule (100 mg total) by mouth 2 (two) times daily.  . predniSONE (DELTASONE) 10 MG tablet Take 4 tablets x 1 day and then decrease by 1/2 tablet per day until down to zero mg.  Marland Kitchen tiZANidine (ZANAFLEX) 4 MG tablet Take 1 tablet (4 mg total) at bedtime as needed by mouth for muscle spasms.   No facility-administered encounter medications on file as of 11/26/2017.     Review of Systems  Constitutional: Negative for appetite change and unexpected weight change.  HENT: Negative for congestion and sinus pressure.   Respiratory: Negative for cough, chest tightness and shortness of breath.   Cardiovascular: Negative for chest pain, palpitations and leg swelling.  Gastrointestinal: Negative for nausea and vomiting.       Abdominal discomfort and bloating as outlined.  Loose stool as outlined.    Genitourinary: Negative for difficulty urinating and dysuria.  Musculoskeletal: Negative for joint swelling and myalgias.  Skin: Negative for color change and rash.  Neurological: Negative for dizziness, light-headedness and headaches.  Psychiatric/Behavioral: Negative for agitation and dysphoric mood.       Objective:    Physical Exam  Constitutional: She appears well-developed and well-nourished. No distress.  HENT:  Nose: Nose normal.  Mouth/Throat: Oropharynx is clear and moist.  Neck: Neck supple. No thyromegaly present.  Cardiovascular: Normal rate and regular rhythm.  Pulmonary/Chest: Breath sounds normal. No respiratory distress. She has no wheezes.  Abdominal: Soft. Bowel sounds are normal. There is no tenderness.  Musculoskeletal: She exhibits no  edema or tenderness.  Lymphadenopathy:    She has no cervical adenopathy.  Skin: No rash noted. No erythema.  Psychiatric: She has a normal mood and affect. Her behavior is normal.    BP (!) 144/82 (BP Location: Left Arm, Patient Position: Sitting, Cuff Size: Large)   Pulse 73   Temp 98.8 F (37.1 C) (Oral)   Resp 16   Wt 154 lb 9.6 oz (70.1 kg)   LMP 08/09/2012   SpO2 99%   BMI 26.54 kg/m  Wt Readings from Last 3 Encounters:  11/26/17 154 lb 9.6 oz (70.1 kg)  10/01/17 156 lb 12.8 oz (71.1 kg)  08/14/17 156 lb 6.4 oz (  70.9 kg)     Lab Results  Component Value Date   WBC 6.4 06/15/2017   HGB 13.4 06/15/2017   HCT 40.8 06/15/2017   PLT 235.0 06/15/2017   GLUCOSE 99 11/25/2017   CHOL 180 11/25/2017   TRIG 64.0 11/25/2017   HDL 76.30 11/25/2017   LDLCALC 91 11/25/2017   ALT 8 11/25/2017   AST 13 11/25/2017   NA 141 11/25/2017   K 4.1 11/25/2017   CL 106 11/25/2017   CREATININE 0.90 11/25/2017   BUN 8 11/25/2017   CO2 29 11/25/2017   TSH 3.90 01/29/2017   HGBA1C 5.8 11/25/2017    Mm Screening Breast Tomo Bilateral  Result Date: 08/18/2017 CLINICAL DATA:  Screening. EXAM: 2D DIGITAL SCREENING BILATERAL MAMMOGRAM WITH CAD AND ADJUNCT TOMO COMPARISON:  Previous exam(s). ACR Breast Density Category b: There are scattered areas of fibroglandular density. FINDINGS: There are no findings suspicious for malignancy. Images were processed with CAD. IMPRESSION: No mammographic evidence of malignancy. A result letter of this screening mammogram will be mailed directly to the patient. RECOMMENDATION: Screening mammogram in one year. (Code:SM-B-01Y) BI-RADS CATEGORY  1: Negative. Electronically Signed   By: Fidela Salisbury M.D.   On: 08/18/2017 15:43       Assessment & Plan:   Problem List Items Addressed This Visit    Abdominal pain    Abdominal pain and bloating as outlined.  Just had CT scan.  Unrevealing.  Saw GI.  Not on citrucel.  Will restart.  Continue probiotic.   Get her back in with GI for further evaluation.        Relevant Orders   Ambulatory referral to Gastroenterology   Aortic atherosclerosis ([place])    On mevacor.        Chronic interstitial cystitis    Has been followed by Dr Jacqlyn Larsen.  On no bladder medication now.  Has f/u planned with Dr Jacqlyn Larsen in 12/2017.  Wants urine checked to confirm no infection.  Follow.       Elevated blood pressure reading    Slight elevation today.  Improved on recheck.  Have her spot check her pressure.  Follow.  If persistent elevation, may need medication.        Relevant Orders   TSH   Basic metabolic panel   GERD (gastroesophageal reflux disease)    On protonix.  Still with some epigastric discomfort as outlined.  Add zantac prior to her evening meal.  Get her back in with GI.        Hypercholesterolemia    On lovastatin.  Low cholesterol diet and exercise.  Follow lipid panel and liver function tests.        Relevant Orders   Lipid panel   Hepatic function panel   Stress    Overall stable.  Desires no further intervention at this time.  Follow.         Other Visit Diagnoses    Abnormal urine color       Relevant Orders   Urinalysis, Routine w reflex microscopic (Completed)   Urine Culture (Completed)   Hyperglycemia       Relevant Orders   Hemoglobin A1c       Einar Pheasant, MD

## 2017-11-27 LAB — URINE CULTURE
MICRO NUMBER:: 90262222
SPECIMEN QUALITY:: ADEQUATE

## 2017-11-29 ENCOUNTER — Encounter: Payer: Self-pay | Admitting: Internal Medicine

## 2017-11-29 NOTE — Assessment & Plan Note (Signed)
On lovastatin.  Low cholesterol diet and exercise.  Follow lipid panel and liver function tests.   

## 2017-11-29 NOTE — Assessment & Plan Note (Signed)
On mevacor.  

## 2017-11-29 NOTE — Assessment & Plan Note (Signed)
Abdominal pain and bloating as outlined.  Just had CT scan.  Unrevealing.  Saw GI.  Not on citrucel.  Will restart.  Continue probiotic.  Get her back in with GI for further evaluation.

## 2017-11-29 NOTE — Assessment & Plan Note (Signed)
On protonix.  Still with some epigastric discomfort as outlined.  Add zantac prior to her evening meal.  Get her back in with GI.

## 2017-11-29 NOTE — Assessment & Plan Note (Signed)
Overall stable.  Desires no further intervention at this time.  Follow.

## 2017-11-29 NOTE — Assessment & Plan Note (Signed)
Has been followed by Dr Jacqlyn Larsen.  On no bladder medication now.  Has f/u planned with Dr Jacqlyn Larsen in 12/2017.  Wants urine checked to confirm no infection.  Follow.

## 2017-11-29 NOTE — Assessment & Plan Note (Signed)
Slight elevation today.  Improved on recheck.  Have her spot check her pressure.  Follow.  If persistent elevation, may need medication.

## 2017-12-10 ENCOUNTER — Ambulatory Visit: Payer: Medicare Other

## 2018-02-01 ENCOUNTER — Other Ambulatory Visit: Payer: Self-pay | Admitting: Internal Medicine

## 2018-06-21 IMAGING — CT CT ABD-PELV W/ CM
1 of 3 series · 13 of 32 positions shown, 18 images · IV contrast (APPLIED)
Comparison: Ultrasound, 12/09/2010

CLINICAL DATA: Pt c/o Mid upper abdominal pain and states it's
worse after eating x 3 months. She also c/o intermittent pelvic pain
with constipation x 3 months. NKI Hx Melanoma. Hx Cholecystectomy,
Appendectomy and total hysterectomy.

EXAM:
CT ABDOMEN AND PELVIS WITH CONTRAST
TECHNIQUE: Multidetector CT imaging of the abdomen and pelvis was performed
using the standard protocol following bolus administration of
intravenous contrast.
CONTRAST:  100mL 5PGDT2-AJJ IOPAMIDOL (5PGDT2-AJJ) INJECTION 61%

[Series 2: axial st · axial · 0.71mm/px · z∈[-959,-579]mm · 13 of 86 slices shown, 18 images]
[im 5/86  soft-tissue]
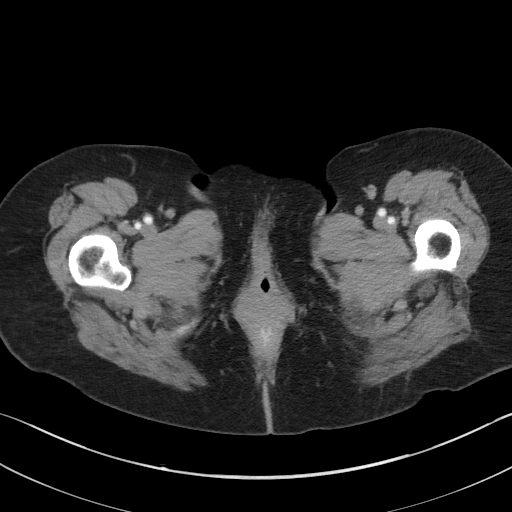
[im 5/86  bone]
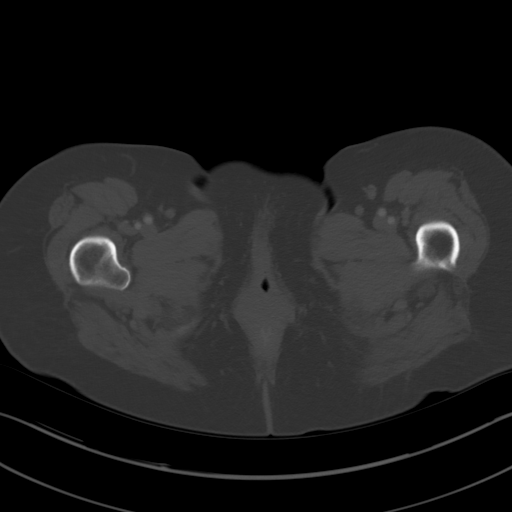
[im 14/86  soft-tissue]
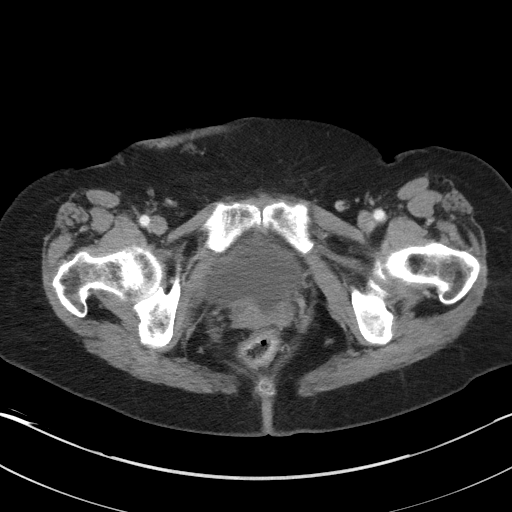
[im 18/86  soft-tissue]
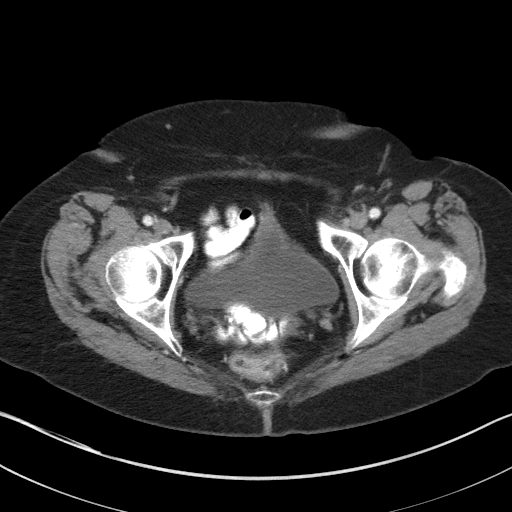
[im 27/86  soft-tissue]
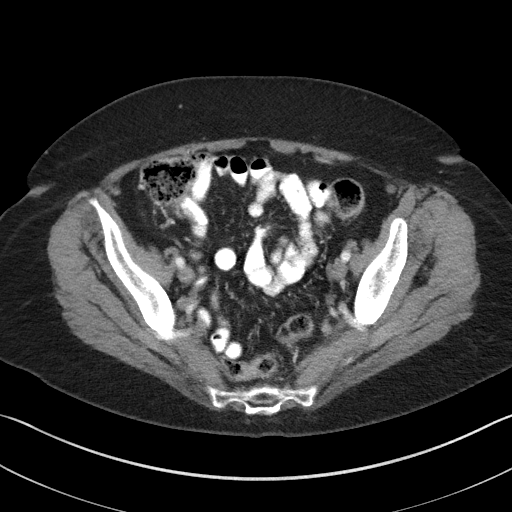
[im 32/86  soft-tissue]
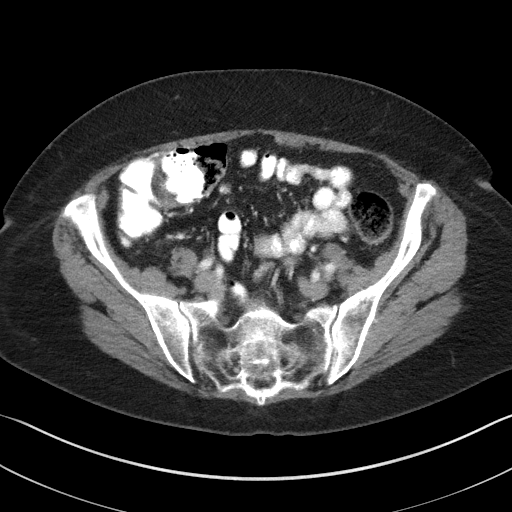
[im 41/86  soft-tissue]
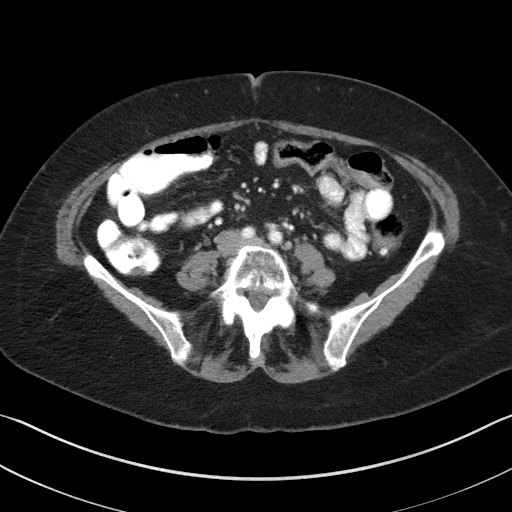
[im 45/86  soft-tissue]
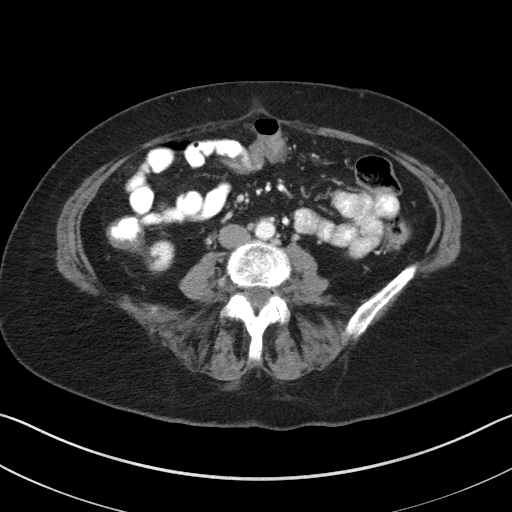
[im 54/86  soft-tissue]
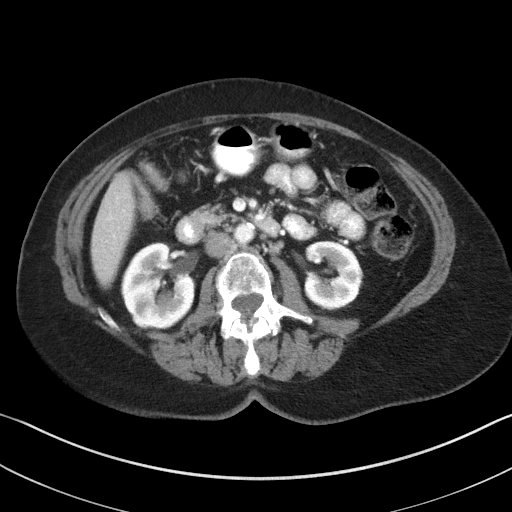
[im 59/86  soft-tissue]
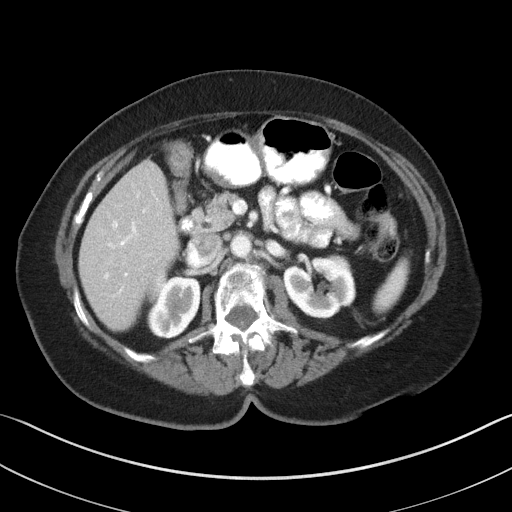
[im 59/86  bone]
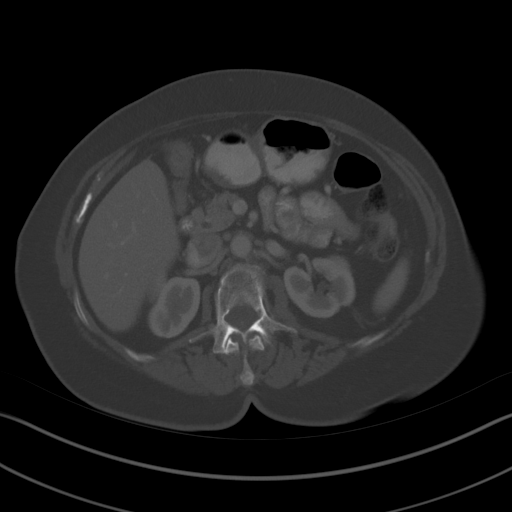
[im 68/86  soft-tissue]
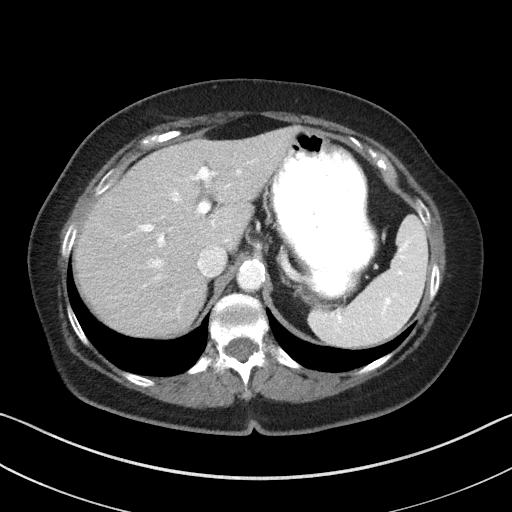
[im 68/86  lung]
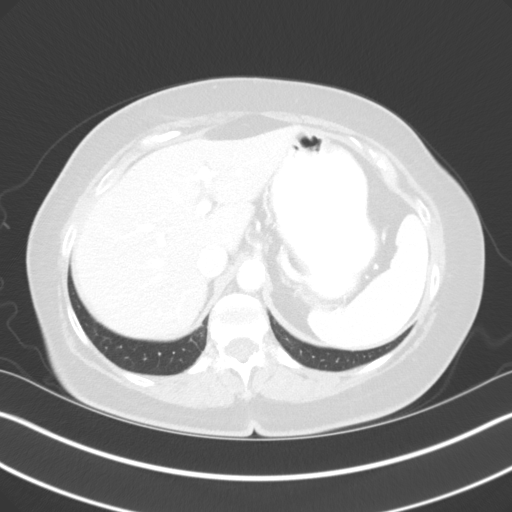
[im 72/86  soft-tissue]
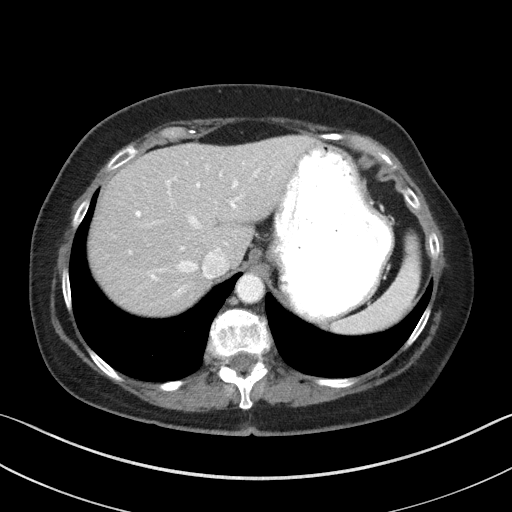
[im 72/86  lung]
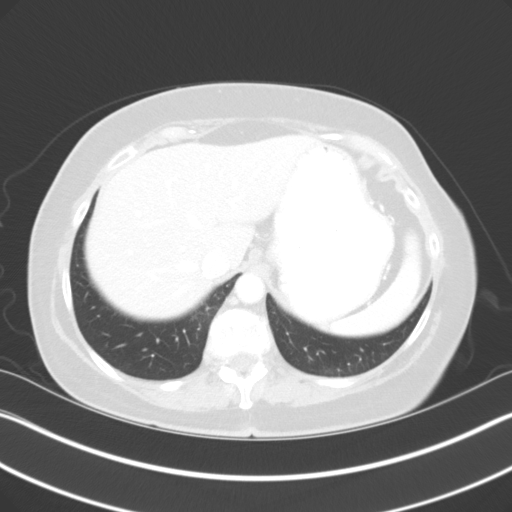
[im 77/86  lung]
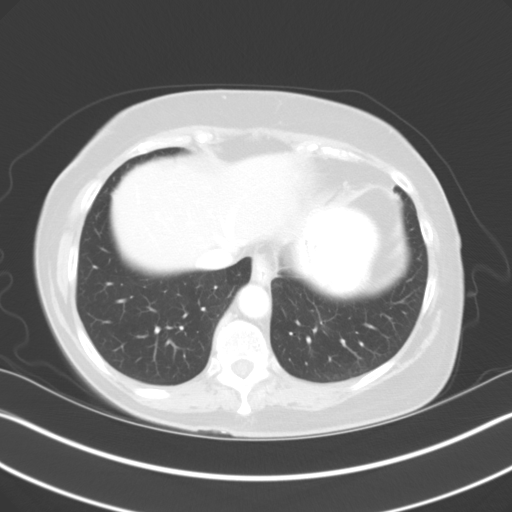
[im 81/86  soft-tissue]
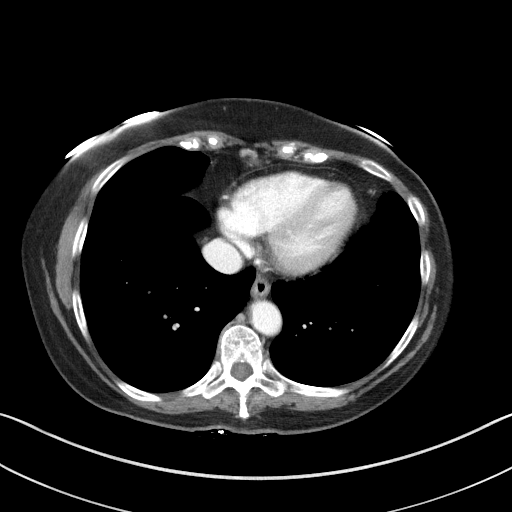
[im 81/86  lung]
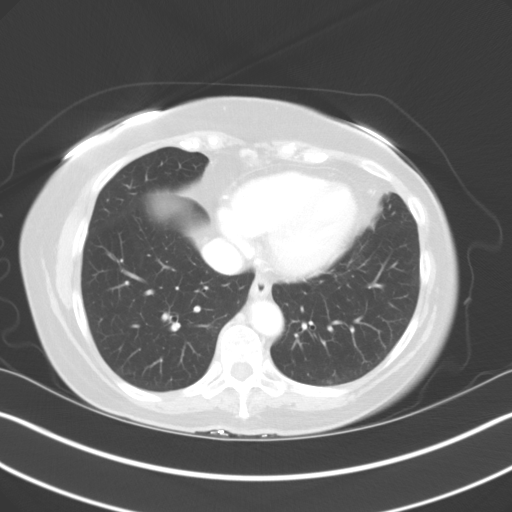

[13 of 32 positions shown; findings below may reference images not displayed]

FINDINGS: Lower chest: Lung bases are clear.  Heart normal in size.

Hepatobiliary: No focal liver abnormality is seen. Status post
cholecystectomy. No biliary dilatation.

Pancreas: Unremarkable. No pancreatic ductal dilatation or
surrounding inflammatory changes.

Spleen: Normal in size without focal abnormality.

Adrenals/Urinary Tract: No adrenal masses.

There is a vague area of mildly decreased attenuation from the upper
pole the left kidney with thinning of the overlying cortex. This is
likely a chronic area of prior scarring. There is no defined mass.
Kidneys are otherwise unremarkable. There are no intrarenal stones.
No hydronephrosis. Ureters are normal course and caliber. Bladder is
unremarkable.

Stomach/Bowel: Stomach is unremarkable. Small bowel is normal in
caliber with no wall thickening or inflammation. Colon is normal in
caliber. There is no colonic wall thickening or inflammation. No
significant increase in stool.

Vascular/Lymphatic: Aortic atherosclerosis. No enlarged abdominal or
pelvic lymph nodes.

Reproductive: Status post hysterectomy. No adnexal masses.

Other: No abdominal wall hernia or abnormality. No abdominopelvic
ascites.

Musculoskeletal: No fracture or acute finding. No osteoblastic or
osteolytic lesions.
IMPRESSION: 1. No acute findings. No findings to account for the patient's
abdominal pain.
2. No bowel wall thickening or inflammation. No significant increase
in colonic stool.
3. Status post cholecystectomy, appendectomy and hysterectomy.
4. Aortic atherosclerosis.

## 2018-08-03 ENCOUNTER — Other Ambulatory Visit: Payer: Self-pay | Admitting: Internal Medicine

## 2018-08-03 DIAGNOSIS — Z1231 Encounter for screening mammogram for malignant neoplasm of breast: Secondary | ICD-10-CM

## 2018-08-10 ENCOUNTER — Other Ambulatory Visit: Payer: Self-pay | Admitting: Internal Medicine

## 2018-08-19 ENCOUNTER — Ambulatory Visit
Admission: RE | Admit: 2018-08-19 | Discharge: 2018-08-19 | Disposition: A | Discharge status: Home / Self Care | Payer: Medicare Other | Source: Ambulatory Visit | Attending: Internal Medicine | Admitting: Internal Medicine

## 2018-08-19 DIAGNOSIS — Z1231 Encounter for screening mammogram for malignant neoplasm of breast: Secondary | ICD-10-CM | POA: Diagnosis not present

## 2018-09-21 IMAGING — DX DG CHEST 2V
2 series · 2 of 2 positions shown · non-contrast
Comparison: None

CLINICAL DATA: Chest tightness.  Cough

EXAM:
CHEST  2 VIEW

[chest pa]
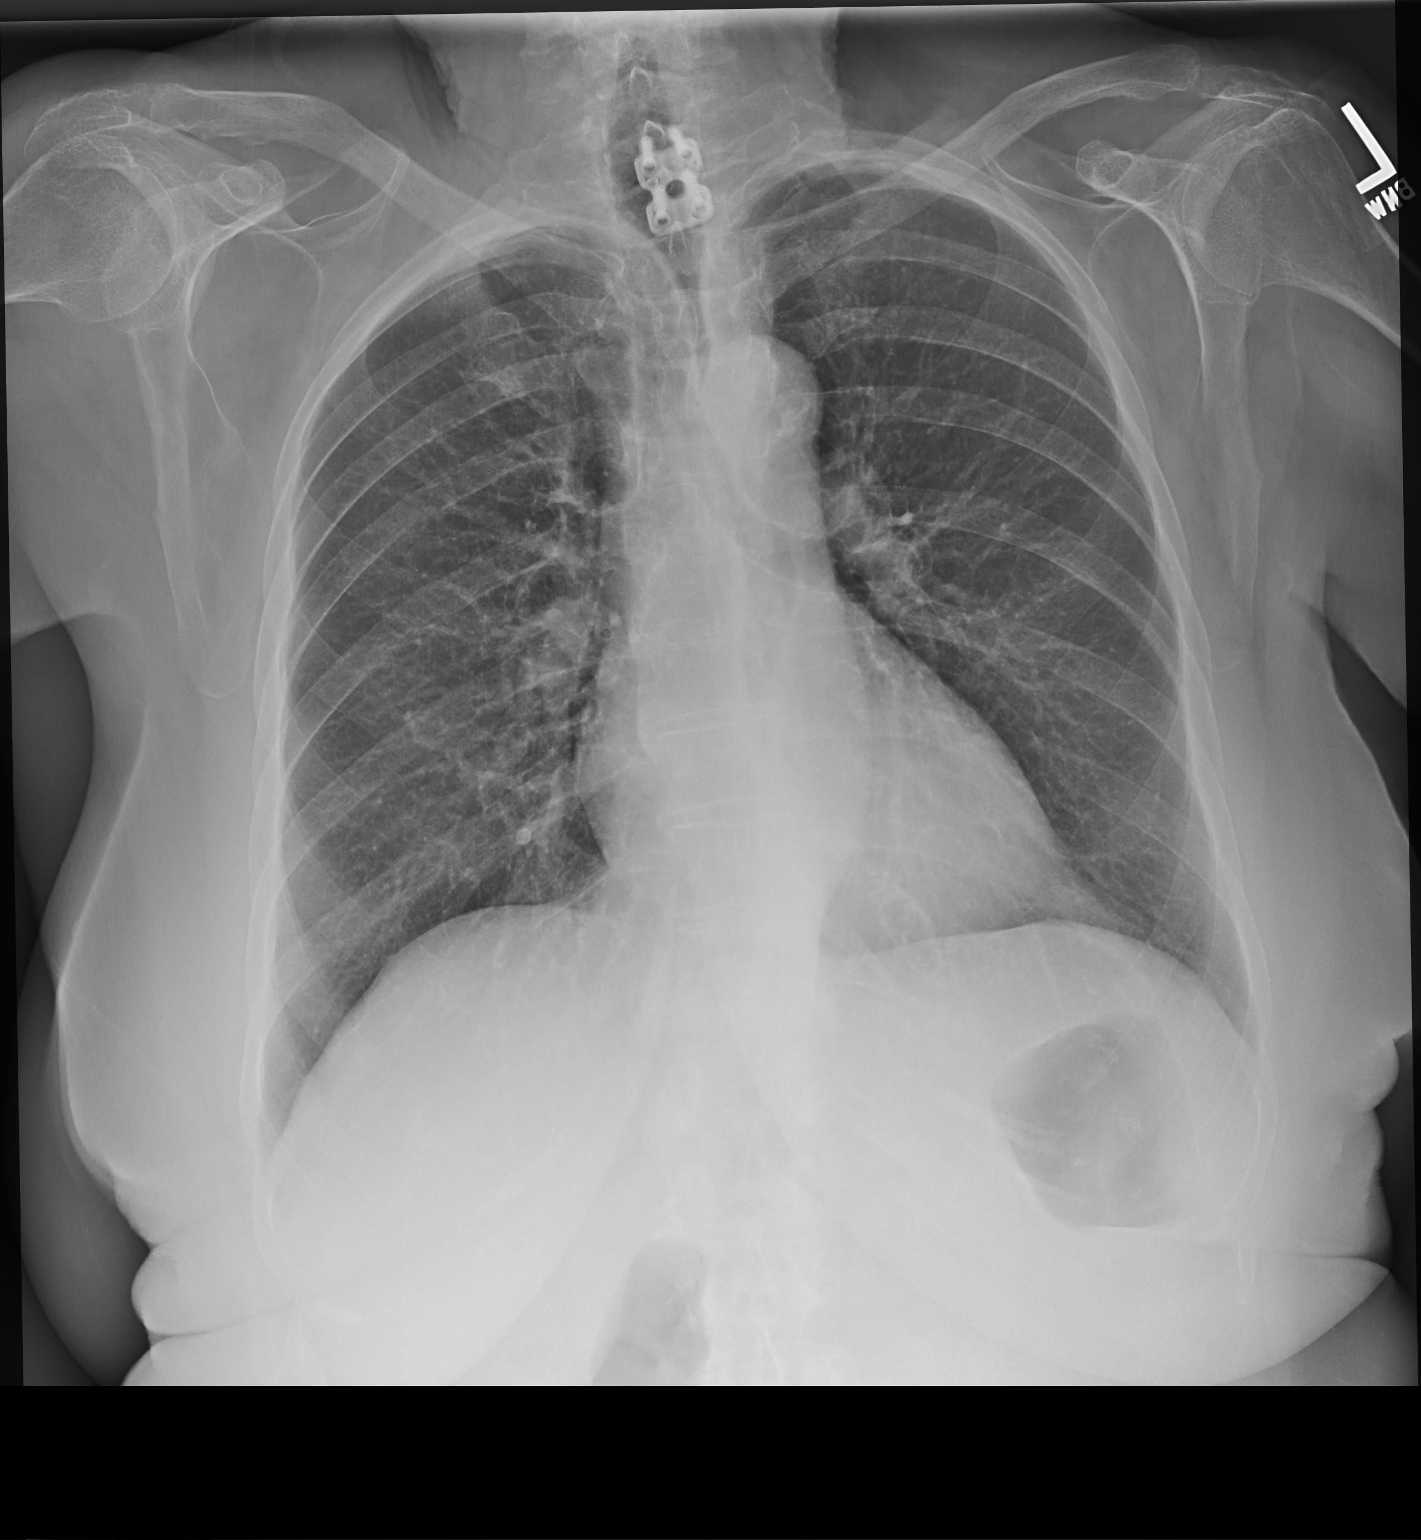

[chest lat]
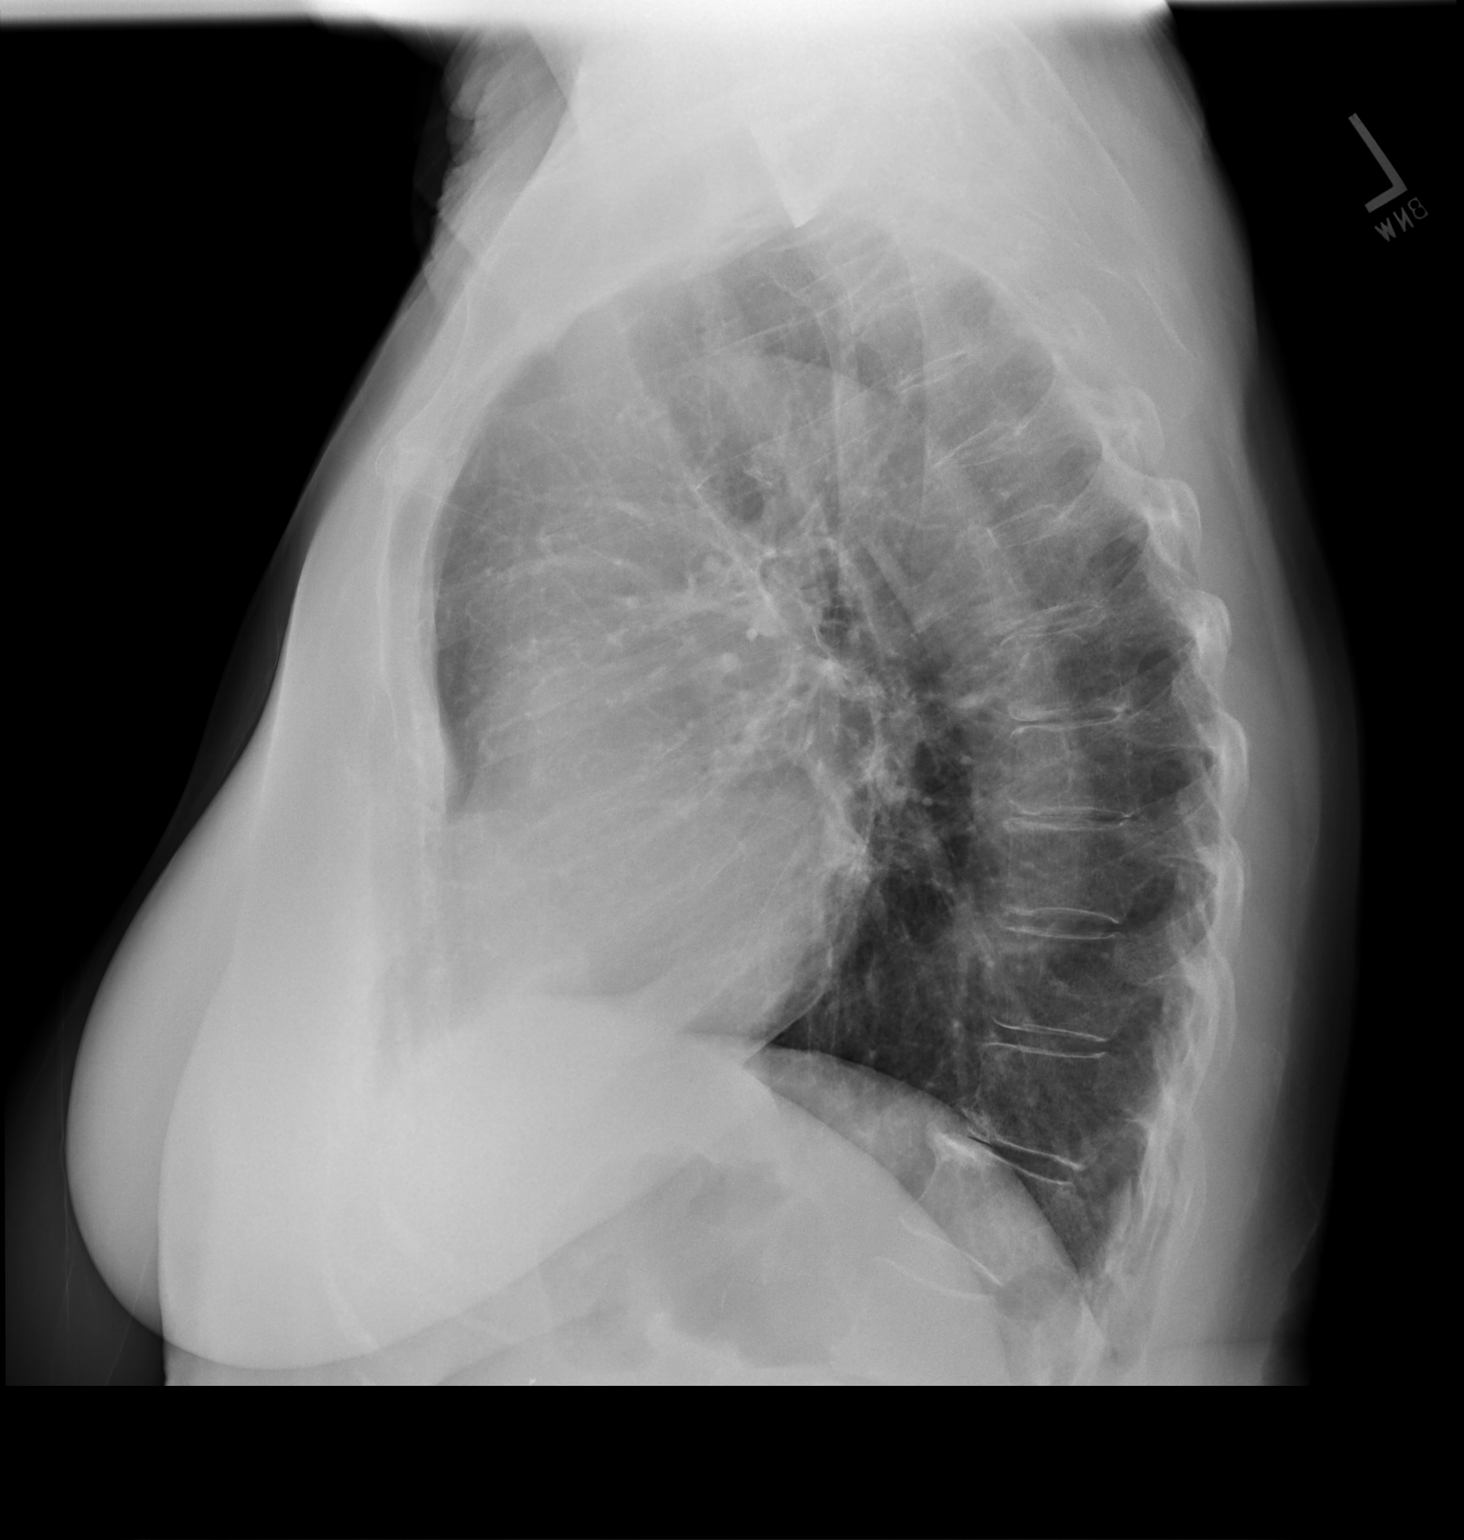

[2 of 2 positions shown; findings below may reference images not displayed]

FINDINGS: The heart size and mediastinal contours are within normal limits.
Both lungs are clear. The visualized skeletal structures are
unremarkable.
IMPRESSION: No active cardiopulmonary disease.

## 2018-10-26 ENCOUNTER — Telehealth: Payer: Self-pay | Admitting: Urology

## 2018-10-26 NOTE — Telephone Encounter (Signed)
Please let this patient know that we are not able to send her anything in, because she is technically not an established patient. Pt has been almost 75yrs since MacDarimid saw her and she is coming to see Erlene Quan who she has never seen. Thanks

## 2018-10-26 NOTE — Telephone Encounter (Signed)
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  She has seen MacDiarmid in the past, but made appt w/Dr Erlene Quan, because she wants to see a lady dr.  She needs a refill on Imipramine, prescribed by [facility] to last until her appt.  She has 5 left, appt w/Brandon is 11/16/18

## 2018-11-01 ENCOUNTER — Other Ambulatory Visit: Payer: Self-pay | Admitting: Internal Medicine

## 2018-11-01 NOTE — Telephone Encounter (Signed)
Patient notified and voiced understanding. She states she has a refill remaining on her RX bottle but the pharmacy was not filling it. She is going to go up there and find out what the issue is. She will try [facility] 1 more time if she does not get the refill from her pharmacy.

## 2018-11-01 NOTE — Telephone Encounter (Signed)
This pt is very frustrated that she can't get any help.  She has been on Imipramine for years.  She is completely out and has an upcoming appt w/Brandon on 2/18.  She has called the pharmacy and [facility] asking for a couple of weeks to last til her appt, but they will not.  The original RX from Dr Jacqlyn Larsen doesn't expire until 4/20, but no one will help her.  She wants to change providers from [place], who she hasn't seen since 10/18 to Dr Erlene Quan.  Is there anything we can do?  I read message that [facility] sent back to me.

## 2018-11-01 NOTE — Telephone Encounter (Signed)
Unfortunately, this is not a medication that anyone in our practice has prescribed for her.  She is not been seen in over a year at our practice (almost two years).  As such, we are not going to be able to prescribe this medication for her.  That is our office policy for many reasons primarily due to our concern for patient safety.  My best recommendation would be that she call [facility] back and ask that it be refilled x 1 month.  She has been seen within a year there.  It is unclear why she was not given a prescription to last her a year in April 2019 as her follow-up was scheduled for a year.   Hollice Espy, MD

## 2018-11-16 ENCOUNTER — Ambulatory Visit: Payer: Medicare Other | Admitting: Urology

## 2018-11-16 ENCOUNTER — Encounter: Payer: Self-pay | Admitting: Urology

## 2018-11-16 VITALS — BP 166/72 | HR 99 | Ht 64.0 in | Wt 151.0 lb

## 2018-11-16 DIAGNOSIS — N3281 Overactive bladder: Secondary | ICD-10-CM

## 2018-11-16 DIAGNOSIS — N3010 Interstitial cystitis (chronic) without hematuria: Secondary | ICD-10-CM

## 2018-11-16 DIAGNOSIS — R339 Retention of urine, unspecified: Secondary | ICD-10-CM

## 2018-11-16 DIAGNOSIS — N3946 Mixed incontinence: Secondary | ICD-10-CM | POA: Diagnosis not present

## 2018-11-16 LAB — URINALYSIS, COMPLETE
Bilirubin, UA: NEGATIVE
Glucose, UA: NEGATIVE
KETONES UA: NEGATIVE
Leukocytes, UA: NEGATIVE
NITRITE UA: NEGATIVE
Protein, UA: NEGATIVE
RBC, UA: NEGATIVE
Specific Gravity, UA: 1.015 (ref 1.005–1.030)
Urobilinogen, Ur: 0.2 mg/dL (ref 0.2–1.0)
pH, UA: 7.5 (ref 5.0–7.5)

## 2018-11-16 LAB — BLADDER SCAN AMB NON-IMAGING

## 2018-11-16 MED ORDER — IMIPRAMINE HCL 25 MG PO TABS: 25.0000 mg | ORAL_TABLET | Freq: Every day | ORAL | 3 refills | Status: DC

## 2018-11-16 NOTE — Progress Notes (Signed)
11/16/2018 11:32 AM   Rachael Austin 03-Apr-1941 [id number]  Referring provider: Einar Pheasant, MD [address] [place], [place] [address]  Chief Complaint  Patient presents with  . IC    follow up    HPI: 78 year old female with personal history of interstitial cystitis who returns today to reestablish care.  She is previously seen and evaluated by Dr. Matilde Sprang in 06/2017 but is also been followed by Dr. Amalia Hailey where she received her diagnosis as well as more recently Dr. Jacqlyn Larsen.  Review of her chart, it appears that she has seen multiple urologists in been going back and forth.  She was previously managed on imipramine with presumably good result per Dr. Bjorn Loser notes.  This was stopped and she was transitioned to [facility] by Dr. Allen Kell.  She became frustrated that this medication did not work and return back to Dr. Jacqlyn Larsen which she was resumed on imipramine (last seen 12/2017).  She reports that the imipramine "calms her bladder".  This is the medication that works best for her.  She can tell the difference when she does not take this medication.  She reports that if she eats any acidic foods she has bladder pain.  She is careful about her diet.  No dysuria or hematuria.    She does have nocturia x 3 with bladder pressure without pain.     She also a personal history of bladder overactivity with urge incontinence episdoes.  She is tried numerous meds in the past including beta 3 agonist, Vesicare.  She does have very mild SUI but minimal bother from this.  No UTIs.   She does feel like she empty her bladder but PVR elevated today.  No history of retention.   PMH: Past Medical History:  Diagnosis Date  . Breast screening, unspecified 2013  . Cancer ([place]) 1992   skin  . Degenerative disk disease   . GERD (gastroesophageal reflux disease)   . Hypercholesterolemia 2008  . Interstitial cystitis    followed by Dr Jacqlyn Larsen  . Other sign and symptom in  breast 2013   Left upper outer quadrant breast "soreness" Ultrasound exam of right breast in the 2 o'clock position with the breast distracted medially showed a 0.3-0.4 with 0.5 cm simple cyst. In the 1 o'clock position where pt reported tenderness US exam was negatiive.  Because of her history of intermittent nipple drainage, ultrasound was completed of the retroareolar area.  . Personal history of tobacco use, presenting hazards to health   . PONV (postoperative nausea and vomiting)   . Special screening for malignant neoplasms, colon     Surgical History: Past Surgical History:  Procedure Laterality Date  . ABDOMINAL HYSTERECTOMY  1992  . APPENDECTOMY    . CERVICAL DISCECTOMY     S/P C7-T1 discectomy with fusion  . CHOLECYSTECTOMY  1990  . COLONOSCOPY WITH PROPOFOL N/A 02/14/2016   Procedure: COLONOSCOPY WITH PROPOFOL;  Surgeon: Lucilla Lame, MD;  Location: [facility];  Service: Endoscopy;  Laterality: N/A;  PT WOULD LIKE 10 ARRIVAL TIME OR LATER  . DILATION AND CURETTAGE OF UTERUS    . ESOPHAGOGASTRODUODENOSCOPY (EGD) WITH PROPOFOL N/A 02/14/2016   Procedure: ESOPHAGOGASTRODUODENOSCOPY (EGD) WITH PROPOFOL with dialtion;  Surgeon: Lucilla Lame, MD;  Location: [facility];  Service: Endoscopy;  Laterality: N/A;  . MELANOMA EXCISION     removed from Left calf 1994    Home Medications:  Allergies as of 11/16/2018      Reactions  Augmentin [amoxicillin-pot Clavulanate] Swelling, Rash   Swelling of the lips   Codeine Sulfate Other (See Comments)   "Makes her hyper"   Vesicare [solifenacin Succinate] Nausea And Vomiting, Rash      Medication List       Accurate as of November 16, 2018 11:32 AM. Always use your most recent med list.        acetaminophen 325 MG tablet Commonly known as:  TYLENOL Take 650 mg by mouth as needed.   aspirin EC 81 MG tablet Take 81 mg by mouth daily.   ibuprofen 200 MG tablet Commonly known as:  ADVIL,MOTRIN Take 200 mg by mouth  every 6 (six) hours as needed.   imipramine 25 MG tablet Commonly known as:  TOFRANIL Take 1 tablet (25 mg total) by mouth at bedtime.   lovastatin 20 MG tablet Commonly known as:  MEVACOR TAKE 1 TABLET BY MOUTH AT BEDTIME   Melatonin 10 MG Caps Take by mouth as needed.   MULTI-VITAMIN DAILY PO Take 1 tablet by mouth daily.   pantoprazole 40 MG tablet Commonly known as:  PROTONIX TAKE 1 TABLET BY MOUTH TWICE DAILY BEFORE  A  MEAL       Allergies:  Allergies  Allergen Reactions  . Augmentin [Amoxicillin-Pot Clavulanate] Swelling and Rash    Swelling of the lips  . Codeine Sulfate Other (See Comments)    "Makes her hyper"  . Vesicare [Solifenacin Succinate] Nausea And Vomiting and Rash    Family History: Family History  Problem Relation Age of Onset  . Hodgkin's lymphoma Mother   . Heart failure Father   . Heart attack Father   . Arthritis Sister        Three sisters w/ degeneratve disk disease  . Headache Sister        Two sisters hx of headache  . Breast cancer Neg Hx   . Colon cancer Neg Hx   . Bladder Cancer Neg Hx   . Kidney cancer Neg Hx     Social History:  reports that she has quit smoking. Her smoking use included cigarettes. She has a 15.00 pack-year smoking history. She has never used smokeless tobacco. She reports that she does not drink alcohol or use drugs.  ROS: UROLOGY Frequent Urination?: Yes Hard to postpone urination?: No Burning/pain with urination?: No Get up at night to urinate?: Yes Leakage of urine?: Yes Urine stream starts and stops?: Yes Trouble starting stream?: Yes Do you have to strain to urinate?: No Blood in urine?: No Urinary tract infection?: No Sexually transmitted disease?: No Injury to kidneys or bladder?: No Painful intercourse?: No Weak stream?: No Currently pregnant?: No Vaginal bleeding?: No Last menstrual period?: n  Gastrointestinal Nausea?: No Vomiting?: No Indigestion/heartburn?: Yes Diarrhea?:  No Constipation?: No  Constitutional Fever: No Night sweats?: No Weight loss?: No Fatigue?: Yes  Skin Skin rash/lesions?: No Itching?: Yes  Eyes Blurred vision?: No Double vision?: No  Ears/Nose/Throat Sore throat?: Yes Sinus problems?: Yes  Hematologic/Lymphatic Swollen glands?: No Easy bruising?: Yes  Cardiovascular Leg swelling?: No Chest pain?: No  Respiratory Cough?: Yes Shortness of breath?: No  Endocrine Excessive thirst?: Yes  Musculoskeletal Back pain?: No Joint pain?: No  Neurological Headaches?: No Dizziness?: No  Psychologic Depression?: No Anxiety?: Yes  Physical Exam: BP (!) 166/72   Pulse 99   Ht 5\' 4"  (1.626 m)   Wt 151 lb (68.5 kg)   LMP 08/09/2012   BMI 25.92 kg/m   Constitutional:  Alert and  oriented, No acute distress. HEENT: [place] AT, moist mucus membranes.  Trachea midline, no masses. Cardiovascular: No clubbing, cyanosis, or edema. Respiratory: Normal respiratory effort, no increased work of breathing. GI: Abdomen is soft, nontender, nondistended, no abdominal masses GU: No CVA tenderness Skin: No rashes, bruises or suspicious lesions. Neurologic: Grossly intact, no focal deficits, moving all 4 extremities. Psychiatric: Normal mood and affect.  Laboratory Data: Lab Results  Component Value Date   WBC 6.4 06/15/2017   HGB 13.4 06/15/2017   HCT 40.8 06/15/2017   MCV 89.5 06/15/2017   PLT 235.0 06/15/2017    Lab Results  Component Value Date   CREATININE 0.90 11/25/2017     Lab Results  Component Value Date   HGBA1C 5.8 11/25/2017    Results for orders placed or performed in visit on 11/16/18  BLADDER SCAN AMB NON-IMAGING  Result Value Ref Range   Scan Result 210ml      Pertinent Imaging: n/a  Assessment & Plan:    1. Interstitial cystitis Continue to follow IC diet, appears to be working well Advised return sooner if she develops any worsening painful symptoms Imipramine 25 mg daily seems to be  working well for control of her urinary symptoms, will continue this medication Follow-up in a year  2. OAB (overactive bladder) Some overactivity symptoms and is failed multiple medications including Myrbetriq and Vesicare in the past Overall her bother is minimal this will continue above regimen and reassess as needed UA today reassuring, PVR minimal  3. Mixed incontinence As above  4. Incomplete bladder emptying Incidental asymptomatic elevated PVR today Discussed timed and double void as well as how to perform daily procedure Warning symptoms are urinary retention were reviewed We discussed this may be secondary to imipramine We will have her return next week for repeat PVR, if remains elevated will either need to cut back or stop imipramine She understands but is not happy about this   Return in about 1 year (around 11/17/2019) for UA/ PVR (also PVR in 1 week with MA).  Hollice Espy, MD  [facility] [address], [place], [place] [address] [phone]  I spent 25 min with this patient of which greater than 50% was spent in counseling and coordination of care with the patient.

## 2018-11-25 ENCOUNTER — Ambulatory Visit (INDEPENDENT_AMBULATORY_CARE_PROVIDER_SITE_OTHER): Payer: Medicare Other | Admitting: Urology

## 2018-11-25 DIAGNOSIS — N3281 Overactive bladder: Secondary | ICD-10-CM | POA: Diagnosis not present

## 2018-11-25 LAB — BLADDER SCAN AMB NON-IMAGING: SCAN RESULT: 356

## 2018-11-25 NOTE — Progress Notes (Signed)
Patient presents today for a bladder scan. The residual today was 356Ml. I spoke to Dr. Erlene Quan and she wants patient to cut the tablet in half. Patient states the pill is very tiny and there would be no way for her to cut it in half. I informed her to take the medication every other day. She is to return in 1 week for a PVR .

## 2018-12-02 ENCOUNTER — Ambulatory Visit (INDEPENDENT_AMBULATORY_CARE_PROVIDER_SITE_OTHER): Payer: Medicare Other | Admitting: Family Medicine

## 2018-12-02 DIAGNOSIS — N3281 Overactive bladder: Secondary | ICD-10-CM

## 2018-12-02 LAB — BLADDER SCAN AMB NON-IMAGING: SCAN RESULT: 113

## 2018-12-02 NOTE — Progress Notes (Signed)
Patient presents today for a PVR. The residual was 113ML, spoke to [place] and she wants patient to continue with taking her Imipramine every other day. Patient voiced understanding.

## 2018-12-20 ENCOUNTER — Other Ambulatory Visit: Payer: Self-pay | Admitting: Internal Medicine

## 2019-02-17 ENCOUNTER — Other Ambulatory Visit: Payer: Self-pay | Admitting: Internal Medicine

## 2019-02-17 DIAGNOSIS — K219 Gastro-esophageal reflux disease without esophagitis: Secondary | ICD-10-CM

## 2019-02-17 DIAGNOSIS — R1313 Dysphagia, pharyngeal phase: Secondary | ICD-10-CM

## 2019-02-17 DIAGNOSIS — R1013 Epigastric pain: Secondary | ICD-10-CM

## 2019-02-22 ENCOUNTER — Other Ambulatory Visit: Payer: Medicare Other

## 2019-02-22 ENCOUNTER — Other Ambulatory Visit: Payer: Self-pay

## 2019-02-22 ENCOUNTER — Ambulatory Visit
Admission: RE | Admit: 2019-02-22 | Discharge: 2019-02-22 | Disposition: A | Discharge status: Home / Self Care | Payer: Medicare Other | Source: Ambulatory Visit | Attending: Internal Medicine | Admitting: Internal Medicine

## 2019-02-22 DIAGNOSIS — K219 Gastro-esophageal reflux disease without esophagitis: Secondary | ICD-10-CM | POA: Diagnosis present

## 2019-02-22 DIAGNOSIS — R1013 Epigastric pain: Secondary | ICD-10-CM | POA: Diagnosis not present

## 2019-02-22 DIAGNOSIS — R1313 Dysphagia, pharyngeal phase: Secondary | ICD-10-CM | POA: Diagnosis present

## 2019-03-16 ENCOUNTER — Other Ambulatory Visit: Payer: Self-pay | Admitting: Internal Medicine

## 2019-03-16 DIAGNOSIS — R1013 Epigastric pain: Secondary | ICD-10-CM

## 2019-03-16 DIAGNOSIS — I700 Atherosclerosis of aorta: Secondary | ICD-10-CM

## 2019-03-24 ENCOUNTER — Other Ambulatory Visit: Payer: Self-pay

## 2019-03-24 ENCOUNTER — Ambulatory Visit
Admission: RE | Admit: 2019-03-24 | Discharge: 2019-03-24 | Disposition: A | Discharge status: Home / Self Care | Payer: Medicare Other | Source: Ambulatory Visit | Attending: Internal Medicine | Admitting: Internal Medicine

## 2019-03-24 DIAGNOSIS — I700 Atherosclerosis of aorta: Secondary | ICD-10-CM | POA: Diagnosis present

## 2019-03-24 DIAGNOSIS — R1013 Epigastric pain: Secondary | ICD-10-CM | POA: Insufficient documentation

## 2019-03-24 MED ORDER — IOHEXOL 350 MG/ML SOLN
75.0000 mL | Freq: Once | INTRAVENOUS | Status: AC | PRN
  Administered 2019-03-24: 75 mL via INTRAVENOUS

## 2019-04-08 ENCOUNTER — Other Ambulatory Visit: Payer: Self-pay

## 2019-04-08 ENCOUNTER — Other Ambulatory Visit
Admission: RE | Admit: 2019-04-08 | Discharge: 2019-04-08 | Disposition: A | Discharge status: Home / Self Care | Payer: Medicare Other | Source: Ambulatory Visit | Attending: Internal Medicine | Admitting: Internal Medicine

## 2019-04-08 DIAGNOSIS — Z01812 Encounter for preprocedural laboratory examination: Secondary | ICD-10-CM | POA: Diagnosis present

## 2019-04-08 DIAGNOSIS — Z1159 Encounter for screening for other viral diseases: Secondary | ICD-10-CM | POA: Insufficient documentation

## 2019-04-08 LAB — SARS CORONAVIRUS 2 (TAT 6-24 HRS): SARS Coronavirus 2: NEGATIVE

## 2019-04-13 ENCOUNTER — Ambulatory Visit: Payer: Medicare Other | Admitting: Certified Registered Nurse Anesthetist

## 2019-04-13 ENCOUNTER — Encounter
Admission: RE | Disposition: A | Discharge status: Home / Self Care | Payer: Self-pay | Source: Home / Self Care | Attending: Internal Medicine

## 2019-04-13 ENCOUNTER — Ambulatory Visit
Admission: RE | Admit: 2019-04-13 | Discharge: 2019-04-13 | Disposition: A | Discharge status: Home / Self Care | Payer: Medicare Other | Attending: Internal Medicine | Admitting: Internal Medicine

## 2019-04-13 ENCOUNTER — Other Ambulatory Visit: Payer: Self-pay

## 2019-04-13 DIAGNOSIS — K449 Diaphragmatic hernia without obstruction or gangrene: Secondary | ICD-10-CM | POA: Insufficient documentation

## 2019-04-13 DIAGNOSIS — Z79899 Other long term (current) drug therapy: Secondary | ICD-10-CM | POA: Insufficient documentation

## 2019-04-13 DIAGNOSIS — R1310 Dysphagia, unspecified: Secondary | ICD-10-CM | POA: Diagnosis not present

## 2019-04-13 DIAGNOSIS — K210 Gastro-esophageal reflux disease with esophagitis: Secondary | ICD-10-CM | POA: Insufficient documentation

## 2019-04-13 DIAGNOSIS — Z87891 Personal history of nicotine dependence: Secondary | ICD-10-CM | POA: Insufficient documentation

## 2019-04-13 DIAGNOSIS — N3010 Interstitial cystitis (chronic) without hematuria: Secondary | ICD-10-CM | POA: Insufficient documentation

## 2019-04-13 DIAGNOSIS — E7800 Pure hypercholesterolemia, unspecified: Secondary | ICD-10-CM | POA: Insufficient documentation

## 2019-04-13 DIAGNOSIS — Z7982 Long term (current) use of aspirin: Secondary | ICD-10-CM | POA: Diagnosis not present

## 2019-04-13 DIAGNOSIS — Z881 Allergy status to other antibiotic agents status: Secondary | ICD-10-CM | POA: Diagnosis not present

## 2019-04-13 DIAGNOSIS — Z791 Long term (current) use of non-steroidal anti-inflammatories (NSAID): Secondary | ICD-10-CM | POA: Diagnosis not present

## 2019-04-13 DIAGNOSIS — M1990 Unspecified osteoarthritis, unspecified site: Secondary | ICD-10-CM | POA: Diagnosis not present

## 2019-04-13 DIAGNOSIS — R1013 Epigastric pain: Secondary | ICD-10-CM | POA: Diagnosis present

## 2019-04-13 DIAGNOSIS — Z880 Allergy status to penicillin: Secondary | ICD-10-CM | POA: Insufficient documentation

## 2019-04-13 DIAGNOSIS — Z85828 Personal history of other malignant neoplasm of skin: Secondary | ICD-10-CM | POA: Insufficient documentation

## 2019-04-13 DIAGNOSIS — Z888 Allergy status to other drugs, medicaments and biological substances status: Secondary | ICD-10-CM | POA: Insufficient documentation

## 2019-04-13 DIAGNOSIS — Z885 Allergy status to narcotic agent status: Secondary | ICD-10-CM | POA: Insufficient documentation

## 2019-04-13 DIAGNOSIS — K2970 Gastritis, unspecified, without bleeding: Secondary | ICD-10-CM | POA: Diagnosis not present

## 2019-04-13 HISTORY — PX: ESOPHAGOGASTRODUODENOSCOPY (EGD) WITH PROPOFOL: SHX5813

## 2019-04-13 SURGERY — ESOPHAGOGASTRODUODENOSCOPY (EGD) WITH PROPOFOL
Anesthesia: General

## 2019-04-13 MED ORDER — SODIUM CHLORIDE 0.9 % IV SOLN
INTRAVENOUS | Status: DC
  Administered 2019-04-13: 11:00:00 via INTRAVENOUS

## 2019-04-13 MED ORDER — PROPOFOL 500 MG/50ML IV EMUL
INTRAVENOUS | Status: DC | PRN
  Administered 2019-04-13: 150 ug/kg/min via INTRAVENOUS

## 2019-04-13 MED ORDER — LIDOCAINE HCL (CARDIAC) PF 100 MG/5ML IV SOSY
PREFILLED_SYRINGE | INTRAVENOUS | Status: DC | PRN
  Administered 2019-04-13: 100 mg via INTRAVENOUS

## 2019-04-13 MED ORDER — PROPOFOL 10 MG/ML IV BOLUS
INTRAVENOUS | Status: DC | PRN
  Administered 2019-04-13: 50 mg via INTRAVENOUS

## 2019-04-13 NOTE — Anesthesia Postprocedure Evaluation (Signed)
Anesthesia Post Note  Patient: Rachael Austin  Procedure(s) Performed: ESOPHAGOGASTRODUODENOSCOPY (EGD) WITH PROPOFOL (N/A )  Patient location during evaluation: Endoscopy Anesthesia Type: General Level of consciousness: awake and alert and oriented Pain management: pain level controlled Vital Signs Assessment: post-procedure vital signs reviewed and stable Respiratory status: spontaneous breathing, nonlabored ventilation and respiratory function stable Cardiovascular status: blood pressure returned to baseline and stable Postop Assessment: no signs of nausea or vomiting Anesthetic complications: no     Last Vitals:  Vitals:   04/13/19 1130 04/13/19 1140  BP: 129/69 (!) 154/80  Pulse: 73 70  Resp: (!) 24 18  Temp:    SpO2: 100% 100%    Last Pain:  Vitals:   04/13/19 1110  TempSrc: Tympanic  PainSc:                   

## 2019-04-13 NOTE — Transfer of Care (Signed)
Immediate Anesthesia Transfer of Care Note  Patient: Rachael Austin  Procedure(s) Performed: ESOPHAGOGASTRODUODENOSCOPY (EGD) WITH PROPOFOL (N/A )  Patient Location: PACU  Anesthesia Type:General  Level of Consciousness: awake and alert   Airway & Oxygen Therapy: Patient Spontanous Breathing and Patient connected to nasal cannula oxygen  Post-op Assessment: Report given to RN and Post -op Vital signs reviewed and stable  Post vital signs: Reviewed and stable  Last Vitals:  Vitals Value Taken Time  BP 138/66 04/13/19 1115  Temp 36.1 C 04/13/19 1110  Pulse 91 04/13/19 1116  Resp 19 04/13/19 1116  SpO2 99 % 04/13/19 1116  Vitals shown include unvalidated device data.  Last Pain:  Vitals:   04/13/19 1110  TempSrc: Tympanic  PainSc:          Complications: No apparent anesthesia complications

## 2019-04-13 NOTE — Anesthesia Preprocedure Evaluation (Signed)
Anesthesia Evaluation  Patient identified by MRN, date of birth, ID band Patient awake    Reviewed: Allergy & Precautions, NPO status , Patient's Chart, lab work & pertinent test results  History of Anesthesia Complications (+) PONV and history of anesthetic complications  Airway Mallampati: II  TM Distance: >3 FB Neck ROM: Full    Dental no notable dental hx.    Pulmonary neg sleep apnea, neg COPD, former smoker,    breath sounds clear to auscultation- rhonchi (-) wheezing      Cardiovascular (-) hypertension(-) CAD, (-) Past MI, (-) Cardiac Stents and (-) CABG  Rhythm:Regular Rate:Normal - Systolic murmurs and - Diastolic murmurs    Neuro/Psych neg Seizures negative neurological ROS  negative psych ROS   GI/Hepatic Neg liver ROS, hiatal hernia, GERD  ,  Endo/Other  negative endocrine ROSneg diabetes  Renal/GU negative Renal ROS     Musculoskeletal  (+) Arthritis ,   Abdominal (+) - obese,   Peds  Hematology negative hematology ROS (+)   Anesthesia Other Findings Past Medical History: 2013: Breast screening, unspecified 1992: Cancer ([place])     Comment:  skin No date: Degenerative disk disease No date: GERD (gastroesophageal reflux disease) 2008: Hypercholesterolemia No date: Interstitial cystitis     Comment:  followed by Dr Jacqlyn Larsen 2013: Other sign and symptom in breast     Comment:  Left upper outer quadrant breast "soreness" Ultrasound               exam of right breast in the 2 o'clock position with the               breast distracted medially showed a 0.3-0.4 with 0.5 cm               simple cyst. In the 1 o'clock position where pt reported               tenderness US exam was negatiive.  Because of her history              of intermittent nipple drainage, ultrasound was completed              of the retroareolar area. No date: Personal history of tobacco use, presenting hazards to health No date: PONV  (postoperative nausea and vomiting) No date: Special screening for malignant neoplasms, colon   Reproductive/Obstetrics                             Anesthesia Physical Anesthesia Plan  ASA: II  Anesthesia Plan: General   Post-op Pain Management:    Induction: Intravenous  PONV Risk Score and Plan: 3 and Propofol infusion  Airway Management Planned: Natural Airway  Additional Equipment:   Intra-op Plan:   Post-operative Plan:   Informed Consent: I have reviewed the patients History and Physical, chart, labs and discussed the procedure including the risks, benefits and alternatives for the proposed anesthesia with the patient or authorized representative who has indicated his/her understanding and acceptance.     Dental advisory given  Plan Discussed with: CRNA and Anesthesiologist  Anesthesia Plan Comments:         Anesthesia Quick Evaluation

## 2019-04-13 NOTE — Anesthesia Procedure Notes (Signed)
Performed by: , , CRNA Pre-anesthesia Checklist: Patient identified, Emergency Drugs available, Suction available, Patient being monitored and Timeout performed Patient Re-evaluated:Patient Re-evaluated prior to induction Oxygen Delivery Method: Nasal cannula Induction Type: IV induction       

## 2019-04-13 NOTE — H&P (Signed)
Outpatient short stay form Pre-procedure 04/13/2019 10:44 AM Rachael Austin, M.D.  Primary Physician:Rachael Austin, M.D.  Reason for visit:  Hx epigastric pain, pill dysphagia, GERD with some minimal result to PPI therapy.  History of present illness: Patient has intermittent pill dysphagia, GERD and epigastric pain, Workup including H Pylori, CTA were negative. Last EGD in 2017 showed small hiatal hernia. Empiric esophageal dilation was performed.     Current Facility-Administered Medications:  .  0.9 %  sodium chloride infusion, , Intravenous, Continuous, [place], Benay Pike, MD, Last Rate: 20 mL/hr at 04/13/19 1038  Medications Prior to Admission  Medication Sig Dispense Refill Last Dose  . acetaminophen (TYLENOL) 325 MG tablet Take 650 mg by mouth as needed.   Past Month at Unknown time  . aspirin EC 81 MG tablet Take 81 mg by mouth daily.   Past Month at Unknown time  . ibuprofen (ADVIL,MOTRIN) 200 MG tablet Take 200 mg by mouth every 6 (six) hours as needed.   Past Month at Unknown time  . lovastatin (MEVACOR) 20 MG tablet TAKE 1 TABLET BY MOUTH AT BEDTIME 90 tablet 0 04/12/2019 at Unknown time  . Melatonin 10 MG CAPS Take by mouth as needed.    Past Month at Unknown time  . Multiple Vitamin (MULTI-VITAMIN DAILY PO) Take 1 tablet by mouth daily.   Past Month at Unknown time  . imipramine (TOFRANIL) 25 MG tablet Take 1 tablet (25 mg total) by mouth at bedtime. (Patient not taking: Reported on 04/13/2019) 90 tablet 3 Completed Course at Unknown time  . pantoprazole (PROTONIX) 40 MG tablet TAKE 1 TABLET BY MOUTH TWICE DAILY BEFORE  A  MEAL (Patient not taking: Reported on 04/13/2019) 180 tablet 0 Not Taking at Unknown time     Allergies  Allergen Reactions  . Augmentin [Amoxicillin-Pot Clavulanate] Swelling and Rash    Swelling of the lips  . Codeine Sulfate Other (See Comments)    "Makes her hyper"  . Vesicare [Solifenacin Succinate] Nausea And Vomiting and Rash     Past  Medical History:  Diagnosis Date  . Breast screening, unspecified 2013  . Cancer ([place]) 1992   skin  . Degenerative disk disease   . GERD (gastroesophageal reflux disease)   . Hypercholesterolemia 2008  . Interstitial cystitis    followed by Dr Jacqlyn Larsen  . Other sign and symptom in breast 2013   Left upper outer quadrant breast "soreness" Ultrasound exam of right breast in the 2 o'clock position with the breast distracted medially showed a 0.3-0.4 with 0.5 cm simple cyst. In the 1 o'clock position where pt reported tenderness US exam was negatiive.  Because of her history of intermittent nipple drainage, ultrasound was completed of the retroareolar area.  . Personal history of tobacco use, presenting hazards to health   . PONV (postoperative nausea and vomiting)   . Special screening for malignant neoplasms, colon     Review of systems:  Otherwise negative.    Physical Exam  Gen: Alert, oriented. Appears stated age.  HEENT: /AT. PERRLA. Lungs: CTA, no wheezes. CV: RR nl S1, S2. Abd: soft, benign, no masses. BS+ Ext: No edema. Pulses 2+    Planned procedures: Proceed with EGD. The patient understands the nature of the planned procedure, indications, risks, alternatives and potential complications including but not limited to bleeding, infection, perforation, damage to internal organs and possible oversedation/side effects from anesthesia. The patient agrees and gives consent to proceed.  Please refer to procedure notes for findings,  recommendations and patient disposition/instructions.     Rachael Austin, M.D. Gastroenterology 04/13/2019  10:44 AM

## 2019-04-13 NOTE — OR Nursing (Signed)
PT C/O OF JAW PAIN ON THE LEFT JAW. Dr Andree Coss to examine pt. Dr Alice Reichert aware. Pt instructed to take tylenol and apply warm compress.

## 2019-04-13 NOTE — Anesthesia Post-op Follow-up Note (Signed)
Anesthesia QCDR form completed.        

## 2019-04-13 NOTE — Op Note (Signed)
[facility] Patient Name: Fajr Fife Procedure Date: 04/13/2019 10:43 AM MRN: [id number] Account #: [id number] Date of Birth: [birth year] Admit Type: Outpatient Age: 78 Room: [facility] ENDO ROOM 4 Gender: Female Note Status: Finalized Procedure:            Upper GI endoscopy Indications:          Epigastric abdominal pain, Dysphagia, Follow-up of                        esophageal reflux, Failure to respond to medical                        treatment Providers:            Benay Pike.  MD, MD Medicines:            Propofol per Anesthesia Complications:        No immediate complications. Procedure:            Pre-Anesthesia Assessment:                       - The risks and benefits of the procedure and the                        sedation options and risks were discussed with the                        patient. All questions were answered and informed                        consent was obtained.                       - Patient identification and proposed procedure were                        verified prior to the procedure by the nurse. The                        procedure was verified in the procedure room.                       - ASA Grade Assessment: III - A patient with severe                        systemic disease.                       - After reviewing the risks and benefits, the patient                        was deemed in satisfactory condition to undergo the                        procedure.                       After obtaining informed consent, the endoscope was                        passed under direct vision. Throughout the procedure,  the patient's blood pressure, pulse, and oxygen                        saturations were monitored continuously. The Endoscope                        was introduced through the mouth, and advanced to the                        third part of duodenum. The upper GI endoscopy was                         accomplished without difficulty. The patient tolerated                        the procedure well. Findings:      Mucosal changes including circumferential folds and crepe paper       esophagus were found in the entire esophagus. Esophageal findings were       graded using the Eosinophilic Esophagitis Endoscopic Reference Score       (EoE-EREFS) as: Edema Grade 0 Normal (distinct vascular markings), Rings       Grade 1 Mild (subtle circumferential ridges seen on esophageal       distension), Exudates Grade 0 None (no white lesions seen), Furrows       Grade 0 None (no vertical lines seen) and Stricture none (no stricture       found). Biopsies were obtained from the proximal and distal esophagus       with cold forceps for histology of suspected eosinophilic esophagitis.      Localized minimal inflammation characterized by erythema was found in       the gastric antrum. Biopsies were taken with a cold forceps for       Helicobacter pylori testing.      The examined duodenum was normal.      The exam was otherwise without abnormality. Impression:           - Esophageal mucosal changes suggestive of eosinophilic                        esophagitis. Biopsied.                       - Gastritis. Biopsied.                       - Normal examined duodenum.                       - The examination was otherwise normal. Recommendation:       - Patient has a contact number available for                        emergencies. The signs and symptoms of potential                        delayed complications were discussed with the patient.                        Return to normal activities tomorrow. Written discharge  instructions were provided to the patient.                       - Resume previous diet.                       - Continue present medications.                       - Await pathology results.                       - Return to physician assistant  in 3 months.                       - You will be seen by Octavia Bruckner, PA-C for your                        follow up visit in the office.                       - The findings and recommendations were discussed with                        the patient. Procedure Code(s):    --- Professional ---                       [phone], Esophagogastroduodenoscopy, flexible, transoral;                        with biopsy, single or multiple Diagnosis Code(s):    --- Professional ---                       K21.9, Gastro-esophageal reflux disease without                        esophagitis                       R13.10, Dysphagia, unspecified                       R10.13, Epigastric pain                       K29.70, Gastritis, unspecified, without bleeding                       K22.8, Other specified diseases of esophagus CPT copyright 2019 American Medical Association. All rights reserved. The codes documented in this report are preliminary and upon coder review may  be revised to meet current compliance requirements. Efrain Sella MD, MD 04/13/2019 11:11:49 AM This report has been signed electronically. Number of Addenda: 0 Note Initiated On: 04/13/2019 10:43 AM Estimated Blood Loss: Estimated blood loss was minimal.      [facility]

## 2019-04-14 ENCOUNTER — Encounter: Payer: Self-pay | Admitting: Internal Medicine

## 2019-04-15 LAB — SURGICAL PATHOLOGY

## 2019-05-09 ENCOUNTER — Other Ambulatory Visit: Payer: Self-pay | Admitting: Internal Medicine

## 2019-06-07 ENCOUNTER — Other Ambulatory Visit: Payer: Self-pay

## 2019-06-07 ENCOUNTER — Ambulatory Visit (INDEPENDENT_AMBULATORY_CARE_PROVIDER_SITE_OTHER): Payer: Medicare Other | Admitting: Internal Medicine

## 2019-06-07 ENCOUNTER — Encounter: Payer: Self-pay | Admitting: Internal Medicine

## 2019-06-07 VITALS — BP 128/72 | HR 67 | Temp 97.4°F | Resp 16 | Ht 64.0 in | Wt 150.4 lb

## 2019-06-07 DIAGNOSIS — F439 Reaction to severe stress, unspecified: Secondary | ICD-10-CM

## 2019-06-07 DIAGNOSIS — E7800 Pure hypercholesterolemia, unspecified: Secondary | ICD-10-CM | POA: Diagnosis not present

## 2019-06-07 DIAGNOSIS — Z0000 Encounter for general adult medical examination without abnormal findings: Secondary | ICD-10-CM

## 2019-06-07 DIAGNOSIS — I700 Atherosclerosis of aorta: Secondary | ICD-10-CM

## 2019-06-07 DIAGNOSIS — Z1231 Encounter for screening mammogram for malignant neoplasm of breast: Secondary | ICD-10-CM | POA: Diagnosis not present

## 2019-06-07 DIAGNOSIS — K219 Gastro-esophageal reflux disease without esophagitis: Secondary | ICD-10-CM

## 2019-06-07 DIAGNOSIS — R300 Dysuria: Secondary | ICD-10-CM

## 2019-06-07 DIAGNOSIS — R109 Unspecified abdominal pain: Secondary | ICD-10-CM

## 2019-06-07 DIAGNOSIS — R739 Hyperglycemia, unspecified: Secondary | ICD-10-CM | POA: Diagnosis not present

## 2019-06-07 DIAGNOSIS — Z23 Encounter for immunization: Secondary | ICD-10-CM | POA: Diagnosis not present

## 2019-06-07 DIAGNOSIS — N3010 Interstitial cystitis (chronic) without hematuria: Secondary | ICD-10-CM

## 2019-06-07 DIAGNOSIS — Z0001 Encounter for general adult medical examination with abnormal findings: Secondary | ICD-10-CM

## 2019-06-07 DIAGNOSIS — E2839 Other primary ovarian failure: Secondary | ICD-10-CM

## 2019-06-07 LAB — POCT URINALYSIS DIPSTICK
Bilirubin, UA: NEGATIVE
Blood, UA: NEGATIVE
Glucose, UA: NEGATIVE
Ketones, UA: NEGATIVE
Leukocytes, UA: NEGATIVE
Nitrite, UA: NEGATIVE
Protein, UA: NEGATIVE
Spec Grav, UA: <=1.005 — AB (ref 1.010–1.025)
Urobilinogen, UA: 0.2 E.U./dL
pH, UA: 6 (ref 5.0–8.0)

## 2019-06-07 LAB — CBC WITH DIFFERENTIAL/PLATELET
Basophils Absolute: 0 10*3/uL (ref 0.0–0.1)
Basophils Relative: 1.1 % (ref 0.0–3.0)
Eosinophils Absolute: 0.1 10*3/uL (ref 0.0–0.7)
Eosinophils Relative: 1.7 % (ref 0.0–5.0)
HCT: 40.4 % (ref 36.0–46.0)
Hemoglobin: 13.5 g/dL (ref 12.0–15.0)
Lymphocytes Relative: 29.7 % (ref 12.0–46.0)
Lymphs Abs: 1.4 10*3/uL (ref 0.7–4.0)
MCHC: 33.5 g/dL (ref 30.0–36.0)
MCV: 90 fl (ref 78.0–100.0)
Monocytes Absolute: 0.5 10*3/uL (ref 0.1–1.0)
Monocytes Relative: 10.1 % (ref 3.0–12.0)
Neutro Abs: 2.7 10*3/uL (ref 1.4–7.7)
Neutrophils Relative %: 57.4 % (ref 43.0–77.0)
Platelets: 231 10*3/uL (ref 150.0–400.0)
RBC: 4.49 Mil/uL (ref 3.87–5.11)
RDW: 14 % (ref 11.5–15.5)
WBC: 4.7 10*3/uL (ref 4.0–10.5)

## 2019-06-07 LAB — BASIC METABOLIC PANEL
BUN: 7 mg/dL (ref 6–23)
CO2: 28 mEq/L (ref 19–32)
Calcium: 9.8 mg/dL (ref 8.4–10.5)
Chloride: 104 mEq/L (ref 96–112)
Creatinine, Ser: 0.93 mg/dL (ref 0.40–1.20)
GFR: 58.1 mL/min — ABNORMAL LOW (ref >60.00)
Glucose, Bld: 97 mg/dL (ref 70–99)
Potassium: 4.5 mEq/L (ref 3.5–5.1)
Sodium: 141 mEq/L (ref 135–145)

## 2019-06-07 LAB — LIPID PANEL
Cholesterol: 181 mg/dL (ref 0–200)
HDL: 75 mg/dL (ref >39.00)
LDL Cholesterol: 91 mg/dL (ref 0–99)
NonHDL: 106.21
Total CHOL/HDL Ratio: 2
Triglycerides: 75 mg/dL (ref 0.0–149.0)
VLDL: 15 mg/dL (ref 0.0–40.0)

## 2019-06-07 LAB — HEPATIC FUNCTION PANEL
ALT: 9 U/L (ref 0–35)
AST: 15 U/L (ref 0–37)
Albumin: 4.6 g/dL (ref 3.5–5.2)
Alkaline Phosphatase: 73 U/L (ref 39–117)
Bilirubin, Direct: 0.1 mg/dL (ref 0.0–0.3)
Total Bilirubin: 0.8 mg/dL (ref 0.2–1.2)
Total Protein: 6.9 g/dL (ref 6.0–8.3)

## 2019-06-07 LAB — TSH: TSH: 3.13 u[IU]/mL (ref 0.35–4.50)

## 2019-06-07 LAB — URINALYSIS, MICROSCOPIC ONLY
RBC / HPF: NONE SEEN (ref 0–?)
WBC, UA: NONE SEEN (ref 0–?)

## 2019-06-07 LAB — HEMOGLOBIN A1C: Hgb A1c MFr Bld: 5.8 % (ref 4.6–6.5)

## 2019-06-07 MED ORDER — SERTRALINE HCL 50 MG PO TABS: ORAL_TABLET | ORAL | 2 refills | Status: DC

## 2019-06-07 MED ORDER — RABEPRAZOLE SODIUM 20 MG PO TBEC: 20.0000 mg | DELAYED_RELEASE_TABLET | Freq: Every day | ORAL | 2 refills | Status: DC

## 2019-06-07 NOTE — Progress Notes (Signed)
[name], female   DOB: 1940/12/25, 78 y.o.   MRN: [id number]   Subjective:    [name], female    DOB: [birth year], 78 y.o.   MRN: [id number]  HPI  Patient here for her physical exam.  She has been seeing GI for GERD, epigastric pain and pill dysphagia.  Is s/p EGD.  Has been on dexilant and pepcid.  Reports to me that she feels worse on the dexilant.  Wants to try something else.  Not taking pepcid.  Some issues with constipation.  Discussed miralax and benefiber.  No chest pain.  No sob.  Breathing stable.  No fever or chest congestion.  Does have issues with her bladder.  Seeing urology.  Per their note, back on imipramine.  She states she is not taking.  States medication has not helped her urinary symptoms.  Plans to f/u with urology and discuss with them.  Discussed immunizations.     Past Medical History:  Diagnosis Date  . Breast screening, unspecified 2013  . Cancer ([place]) 1992   skin  . Degenerative disk disease   . GERD (gastroesophageal reflux disease)   . Hypercholesterolemia 2008  . Interstitial cystitis    followed by Dr Jacqlyn Larsen  . Other sign and symptom in breast 2013   Left upper outer quadrant breast "soreness" Ultrasound exam of right breast in the 2 o'clock position with the breast distracted medially showed a 0.3-0.4 with 0.5 cm simple cyst. In the 1 o'clock position where pt reported tenderness US exam was negatiive.  Because of her history of intermittent nipple drainage, ultrasound was completed of the retroareolar area.  . Personal history of tobacco use, presenting hazards to health   . PONV (postoperative nausea and vomiting)   . Special screening for malignant neoplasms, colon    Past Surgical History:  Procedure Laterality Date  . ABDOMINAL HYSTERECTOMY  1992  . APPENDECTOMY    . CERVICAL DISCECTOMY     S/P C7-T1 discectomy with fusion  . CHOLECYSTECTOMY  1990  . COLONOSCOPY WITH PROPOFOL N/A 02/14/2016   Procedure:  COLONOSCOPY WITH PROPOFOL;  Surgeon: Lucilla Lame, MD;  Location: [facility] [facility];  Service: Endoscopy;  Laterality: N/A;  PT WOULD LIKE 10 ARRIVAL TIME OR LATER  . DILATION AND CURETTAGE OF UTERUS    . ESOPHAGOGASTRODUODENOSCOPY (EGD) WITH PROPOFOL N/A 02/14/2016   Procedure: ESOPHAGOGASTRODUODENOSCOPY (EGD) WITH PROPOFOL with dialtion;  Surgeon: Lucilla Lame, MD;  Location: [place] [facility];  Service: Endoscopy;  Laterality: N/A;  . ESOPHAGOGASTRODUODENOSCOPY (EGD) WITH PROPOFOL N/A 04/13/2019   Procedure: ESOPHAGOGASTRODUODENOSCOPY (EGD) WITH PROPOFOL;  Surgeon: Toledo, Benay Pike, MD;  Location: [facility];  Service: Gastroenterology;  Laterality: N/A;  . MELANOMA EXCISION     removed from Left calf 1994   Family History  Problem Relation Age of Onset  . Hodgkin's lymphoma Mother   . Heart failure Father   . Heart attack Father   . Arthritis Sister        Three sisters w/ degeneratve disk disease  . Headache Sister        Two sisters hx of headache  . Breast cancer Neg Hx   . Colon cancer Neg Hx   . Bladder Cancer Neg Hx   . Kidney cancer Neg Hx    Social History   Socioeconomic History  . Marital status: Widowed    Spouse name: Not on file  . Number of children: 2  . Years of education: Not  on file  . Highest education level: Not on file  Occupational History  . Not on file  Social Needs  . Financial resource strain: Not on file  . Food insecurity    Worry: Not on file    Inability: Not on file  . Transportation needs    Medical: Not on file    Non-medical: Not on file  Tobacco Use  . Smoking status: Former Smoker    Packs/day: 1.00    Years: 15.00    Pack years: 15.00    Types: Cigarettes  . Smokeless tobacco: Never Used  Substance and Sexual Activity  . Alcohol use: No    Alcohol/week: 0.0 standard drinks  . Drug use: No  . Sexual activity: Not on file  Lifestyle  . Physical activity    Days per week: Not on file    Minutes per session: Not on file   . Stress: Not on file  Relationships  . Social Herbalist on phone: Not on file    Gets together: Not on file    Attends religious service: Not on file    Active member of club or organization: Not on file    Attends meetings of clubs or organizations: Not on file    Relationship status: Not on file  Other Topics Concern  . Not on file  Social History Narrative   Married and has 2 children, daughters.    Outpatient Encounter Medications as of 06/07/2019  Medication Sig  . acetaminophen (TYLENOL) 325 MG tablet Take 650 mg by mouth as needed.  Marland Kitchen aspirin EC 81 MG tablet Take 81 mg by mouth daily.  Marland Kitchen ibuprofen (ADVIL,MOTRIN) 200 MG tablet Take 200 mg by mouth every 6 (six) hours as needed.  . lovastatin (MEVACOR) 20 MG tablet TAKE 1 TABLET BY MOUTH AT BEDTIME  . Melatonin 10 MG CAPS Take by mouth as needed.   . Multiple Vitamin (MULTI-VITAMIN DAILY PO) Take 1 tablet by mouth daily.  . RABEprazole (ACIPHEX) 20 MG tablet Take 1 tablet (20 mg total) by mouth daily.  . sertraline (ZOLOFT) 50 MG tablet Take 1/2 tablet x 1 week and then on tablet per day.  . [DISCONTINUED] DEXILANT 60 MG capsule Take 1 capsule by mouth daily.  . [DISCONTINUED] imipramine (TOFRANIL) 25 MG tablet Take 1 tablet (25 mg total) by mouth at bedtime. (Patient not taking: Reported on 04/13/2019)  . [DISCONTINUED] pantoprazole (PROTONIX) 40 MG tablet TAKE 1 TABLET BY MOUTH TWICE DAILY BEFORE  A  MEAL (Patient not taking: Reported on 04/13/2019)   No facility-administered encounter medications on file as of 06/07/2019.     Review of Systems  Constitutional: Negative for appetite change and unexpected weight change.  HENT: Negative for congestion and sinus pressure.   Eyes: Negative for pain and visual disturbance.  Respiratory: Negative for cough, chest tightness and shortness of breath.   Cardiovascular: Negative for chest pain, palpitations and leg swelling.  Gastrointestinal: Negative for abdominal pain,  diarrhea, nausea and vomiting.  Genitourinary: Negative for vaginal discharge.       Still with bladder issues.  No hematuria.    Musculoskeletal: Negative for joint swelling and myalgias.  Skin: Negative for color change and rash.  Neurological: Negative for dizziness, light-headedness and headaches.  Hematological: Negative for adenopathy. Does not bruise/bleed easily.  Psychiatric/Behavioral: Negative for agitation and dysphoric mood.       Objective:    Physical Exam Constitutional:      General: She  is not in acute distress.    Appearance: Normal appearance. She is well-developed.  HENT:     Right Ear: External ear normal.     Left Ear: External ear normal.  Eyes:     General: No scleral icterus.       Right eye: No discharge.        Left eye: No discharge.     Conjunctiva/sclera: Conjunctivae normal.  Neck:     Musculoskeletal: Neck supple. No muscular tenderness.     Thyroid: No thyromegaly.  Cardiovascular:     Rate and Rhythm: Normal rate and regular rhythm.  Pulmonary:     Effort: No tachypnea, accessory muscle usage or respiratory distress.     Breath sounds: Normal breath sounds. No decreased breath sounds or wheezing.  Chest:     Breasts:        Right: No inverted nipple, mass, nipple discharge or tenderness (no axillary adenopathy).        Left: No inverted nipple, mass, nipple discharge or tenderness (no axilarry adenopathy).  Abdominal:     General: Bowel sounds are normal.     Palpations: Abdomen is soft.     Tenderness: There is no abdominal tenderness.  Musculoskeletal:        General: No swelling or tenderness.  Lymphadenopathy:     Cervical: No cervical adenopathy.  Skin:    Findings: No erythema or rash.  Neurological:     Mental Status: She is alert and oriented to person, place, and time.  Psychiatric:        Mood and Affect: Mood normal.        Behavior: Behavior normal.     BP 128/72   Pulse 67   Temp (!) 97.4 F (36.3 C) (Temporal)    Resp 16   Ht 5\' 4"  (1.626 m)   Wt 150 lb 6.4 oz (68.2 kg)   LMP 08/09/2012   SpO2 97%   BMI 25.82 kg/m  Wt Readings from Last 3 Encounters:  06/07/19 150 lb 6.4 oz (68.2 kg)  04/13/19 149 lb (67.6 kg)  11/16/18 151 lb (68.5 kg)     Lab Results  Component Value Date   WBC 4.7 06/07/2019   HGB 13.5 06/07/2019   HCT 40.4 06/07/2019   PLT 231.0 06/07/2019   GLUCOSE 97 06/07/2019   CHOL 181 06/07/2019   TRIG 75.0 06/07/2019   HDL 75.00 06/07/2019   LDLCALC 91 06/07/2019   ALT 9 06/07/2019   AST 15 06/07/2019   NA 141 06/07/2019   K 4.5 06/07/2019   CL 104 06/07/2019   CREATININE 0.93 06/07/2019   BUN 7 06/07/2019   CO2 28 06/07/2019   TSH 3.13 06/07/2019   HGBA1C 5.8 06/07/2019       Assessment & Plan:   Problem List Items Addressed This Visit    Abdominal pain    Has seen GI.  Has had CT and EGD.  Change to aciphex as outlined.  F/u with urology as outlined.  Follow.        Acid reflux    Recently evaluated by GI.  S/p EGD - gastritis.  On dexilant now.  Reports intolerance.  Wants to change medication.  Trial of aciphex.  pepcid in pm if needed.  Call with update.  Get her back in soon to reassess.        Relevant Medications   RABEprazole (ACIPHEX) 20 MG tablet   Aortic atherosclerosis (HCC)    On mevacor.  Chronic interstitial cystitis    Previously followed by Dr Jacqlyn Larsen.  Being followed by urology in [place] now.  Persistent symptoms.  States she is off imipramine.  Plans to f/u with urology to discuss persistent concerns.        Health care maintenance    Physical today 06/07/19.  S/p hysterectomy.  Mammogram 08/19/18 -  Birads I.  Schedule f/u mammogram.  Colonoscopy 02/14/16 - diverticulosis and non bleeding internal hemorrhoids.  Schedule bone density.        Hypercholesterolemia    On lovastatin.  Low cholesterol diet and exercise.  Follow lipid panel and liver function tests.        Relevant Orders   CBC with Differential/Platelet  (Completed)   Hepatic function panel (Completed)   Lipid panel (Completed)   TSH (Completed)   Basic metabolic panel (Completed)   Stress    Increased stress with her ongoing health issues, etc.  Discussed with her today.  Overall she feels she is handling things relatively well.  Follow.         Other Visit Diagnoses    Routine general medical examination at a health care facility    -  Primary   Dysuria       Relevant Orders   POCT urinalysis dipstick (Completed)   Urine Microscopic (Completed)   Urine Culture (Completed)   Hyperglycemia       Relevant Orders   Hemoglobin A1c (Completed)   Estrogen deficiency       Relevant Orders   DG Bone Density   Visit for screening mammogram       Relevant Orders   MM 3D SCREEN BREAST BILATERAL   Need for immunization against influenza       Relevant Orders   Flu Vaccine QUAD High Dose(Fluad) (Completed)       Einar Pheasant, MD

## 2019-06-09 ENCOUNTER — Encounter: Payer: Self-pay | Admitting: *Deleted

## 2019-06-09 LAB — URINE CULTURE
MICRO NUMBER:: 855501
Result:: NO GROWTH
SPECIMEN QUALITY:: ADEQUATE

## 2019-06-11 NOTE — Assessment & Plan Note (Signed)
Previously followed by Dr Jacqlyn Larsen.  Being followed by urology in [place] now.  Persistent symptoms.  States she is off imipramine.  Plans to f/u with urology to discuss persistent concerns.

## 2019-06-11 NOTE — Assessment & Plan Note (Signed)
Recently evaluated by GI.  S/p EGD - gastritis.  On dexilant now.  Reports intolerance.  Wants to change medication.  Trial of aciphex.  pepcid in pm if needed.  Call with update.  Get her back in soon to reassess.

## 2019-06-11 NOTE — Assessment & Plan Note (Signed)
Physical today 06/07/19.  S/p hysterectomy.  Mammogram 08/19/18 -  Birads I.  Schedule f/u mammogram.  Colonoscopy 02/14/16 - diverticulosis and non bleeding internal hemorrhoids.  Schedule bone density.

## 2019-06-11 NOTE — Assessment & Plan Note (Signed)
On mevacor.  

## 2019-06-11 NOTE — Assessment & Plan Note (Signed)
On lovastatin.  Low cholesterol diet and exercise.  Follow lipid panel and liver function tests.   

## 2019-06-11 NOTE — Assessment & Plan Note (Signed)
Has seen GI.  Has had CT and EGD.  Change to aciphex as outlined.  F/u with urology as outlined.  Follow.

## 2019-06-11 NOTE — Assessment & Plan Note (Signed)
Increased stress with her ongoing health issues, etc.  Discussed with her today.  Overall she feels she is handling things relatively well.  Follow.

## 2019-06-30 ENCOUNTER — Encounter: Payer: Self-pay | Admitting: Internal Medicine

## 2019-07-08 ENCOUNTER — Ambulatory Visit: Payer: Medicare Other | Admitting: Internal Medicine

## 2019-07-25 ENCOUNTER — Ambulatory Visit (INDEPENDENT_AMBULATORY_CARE_PROVIDER_SITE_OTHER): Payer: Medicare Other | Admitting: Internal Medicine

## 2019-07-25 ENCOUNTER — Other Ambulatory Visit: Payer: Self-pay

## 2019-07-25 DIAGNOSIS — R1032 Left lower quadrant pain: Secondary | ICD-10-CM

## 2019-07-25 DIAGNOSIS — I700 Atherosclerosis of aorta: Secondary | ICD-10-CM

## 2019-07-25 DIAGNOSIS — E7800 Pure hypercholesterolemia, unspecified: Secondary | ICD-10-CM

## 2019-07-25 DIAGNOSIS — K219 Gastro-esophageal reflux disease without esophagitis: Secondary | ICD-10-CM | POA: Diagnosis not present

## 2019-07-25 DIAGNOSIS — N3010 Interstitial cystitis (chronic) without hematuria: Secondary | ICD-10-CM

## 2019-07-25 DIAGNOSIS — F439 Reaction to severe stress, unspecified: Secondary | ICD-10-CM

## 2019-07-25 NOTE — Progress Notes (Addendum)
[name], female   DOB: January 19, 1941, 78 y.o.   MRN: [id number]   Subjective:    [name], female    DOB: 09/25/1941, 78 y.o.   MRN: [id number]  HPI  Patient here for a scheduled follow  Up.  She is accompanied by her daughter. History obtained from both of them.  Reports some persistent abdominal pain.  Has had extensive GI w/up.  EGD - 04/13/19 - gastritis with gastric biopsies showing minimal chronic inflammation.  Was placed on dexilant. Had abdominal xray that revealed gas and stool throughout her colon.  Recommended miralax and benefiber.  Per our last visit, she reported that she felt worse on dexilant.  We changed to aciphex.  Daughter reports she feels aciphex has help to improve symptoms.  Pt unsure.  Discussed f/u labs.  In reviewing, she had recent CT scan 02/2019.  No acute abnormality.  She is eating.  Discussed stress, anxiety and depression. Has good support.  Feels under reasonable control.  Still having issues with her bladder.  Previously saw Dr Jacqlyn Larsen.  Seeing Dr Erlene Quan now.  Was on imipramine. Not taking. Discussed f/u.  She is in agreement.      Past Medical History:  Diagnosis Date  . Breast screening, unspecified 2013  . Cancer ([place]) 1992   skin  . Degenerative disk disease   . GERD (gastroesophageal reflux disease)   . Hypercholesterolemia 2008  . Interstitial cystitis    followed by Dr Jacqlyn Larsen  . Other sign and symptom in breast 2013   Left upper outer quadrant breast "soreness" Ultrasound exam of right breast in the 2 o'clock position with the breast distracted medially showed a 0.3-0.4 with 0.5 cm simple cyst. In the 1 o'clock position where pt reported tenderness US exam was negatiive.  Because of her history of intermittent nipple drainage, ultrasound was completed of the retroareolar area.  . Personal history of tobacco use, presenting hazards to health   . PONV (postoperative nausea and vomiting)   . Special screening for  malignant neoplasms, colon    Past Surgical History:  Procedure Laterality Date  . ABDOMINAL HYSTERECTOMY  1992  . APPENDECTOMY    . CERVICAL DISCECTOMY     S/P C7-T1 discectomy with fusion  . CHOLECYSTECTOMY  1990  . COLONOSCOPY WITH PROPOFOL N/A 02/14/2016   Procedure: COLONOSCOPY WITH PROPOFOL;  Surgeon: Lucilla Lame, MD;  Location: [facility];  Service: Endoscopy;  Laterality: N/A;  PT WOULD LIKE 10 ARRIVAL TIME OR LATER  . DILATION AND CURETTAGE OF UTERUS    . ESOPHAGOGASTRODUODENOSCOPY (EGD) WITH PROPOFOL N/A 02/14/2016   Procedure: ESOPHAGOGASTRODUODENOSCOPY (EGD) WITH PROPOFOL with dialtion;  Surgeon: Lucilla Lame, MD;  Location: [place];  Service: Endoscopy;  Laterality: N/A;  . ESOPHAGOGASTRODUODENOSCOPY (EGD) WITH PROPOFOL N/A 04/13/2019   Procedure: ESOPHAGOGASTRODUODENOSCOPY (EGD) WITH PROPOFOL;  Surgeon: Toledo, Benay Pike, MD;  Location: [facility];  Service: Gastroenterology;  Laterality: N/A;  . MELANOMA EXCISION     removed from Left calf 1994   Family History  Problem Relation Age of Onset  . Hodgkin's lymphoma Mother   . Heart failure Father   . Heart attack Father   . Arthritis Sister        Three sisters w/ degeneratve disk disease  . Headache Sister        Two sisters hx of headache  . Breast cancer Neg Hx   . Colon cancer Neg Hx   . Bladder Cancer Neg  Hx   . Kidney cancer Neg Hx    Social History   Socioeconomic History  . Marital status: Widowed    Spouse name: Not on file  . Number of children: 2  . Years of education: Not on file  . Highest education level: Not on file  Occupational History  . Not on file  Social Needs  . Financial resource strain: Not on file  . Food insecurity    Worry: Not on file    Inability: Not on file  . Transportation needs    Medical: Not on file    Non-medical: Not on file  Tobacco Use  . Smoking status: Former Smoker    Packs/day: 1.00    Years: 15.00    Pack years: 15.00    Types:  Cigarettes  . Smokeless tobacco: Never Used  Substance and Sexual Activity  . Alcohol use: No    Alcohol/week: 0.0 standard drinks  . Drug use: No  . Sexual activity: Not on file  Lifestyle  . Physical activity    Days per week: Not on file    Minutes per session: Not on file  . Stress: Not on file  Relationships  . Social Herbalist on phone: Not on file    Gets together: Not on file    Attends religious service: Not on file    Active member of club or organization: Not on file    Attends meetings of clubs or organizations: Not on file    Relationship status: Not on file  Other Topics Concern  . Not on file  Social History Narrative   Married and has 2 children, daughters.    Outpatient Encounter Medications as of 07/25/2019  Medication Sig  . acetaminophen (TYLENOL) 325 MG tablet Take 650 mg by mouth as needed.  Marland Kitchen ibuprofen (ADVIL,MOTRIN) 200 MG tablet Take 200 mg by mouth every 6 (six) hours as needed.  . lovastatin (MEVACOR) 20 MG tablet TAKE 1 TABLET BY MOUTH AT BEDTIME  . Melatonin 10 MG CAPS Take by mouth as needed.   . Multiple Vitamin (MULTI-VITAMIN DAILY PO) Take 1 tablet by mouth daily.  . RABEprazole (ACIPHEX) 20 MG tablet Take 1 tablet (20 mg total) by mouth daily.  . [DISCONTINUED] aspirin EC 81 MG tablet Take 81 mg by mouth daily.  . [DISCONTINUED] sertraline (ZOLOFT) 50 MG tablet Take 1/2 tablet x 1 week and then on tablet per day.   No facility-administered encounter medications on file as of 07/25/2019.    Review of Systems  Constitutional: Negative for appetite change and unexpected weight change.  HENT: Negative for congestion and sinus pressure.   Respiratory: Negative for cough, chest tightness and shortness of breath.   Cardiovascular: Negative for chest pain, palpitations and leg swelling.  Gastrointestinal: Positive for abdominal pain. Negative for diarrhea, nausea and vomiting.  Genitourinary: Negative for vaginal discharge.        Persistent bladder issues as outlined.    Musculoskeletal: Negative for joint swelling and myalgias.  Skin: Negative for color change and rash.  Neurological: Negative for dizziness, light-headedness and headaches.  Psychiatric/Behavioral: Negative for agitation and dysphoric mood.       Increased stress related to ongoing medical issues.         Objective:    Physical Exam Constitutional:      General: She is not in acute distress.    Appearance: Normal appearance.  HENT:     Head: Normocephalic and atraumatic.  Right Ear: External ear normal.     Left Ear: External ear normal.  Eyes:     General: No scleral icterus.       Right eye: No discharge.        Left eye: No discharge.     Conjunctiva/sclera: Conjunctivae normal.  Neck:     Musculoskeletal: Neck supple. No muscular tenderness.     Thyroid: No thyromegaly.  Cardiovascular:     Rate and Rhythm: Normal rate and regular rhythm.  Pulmonary:     Effort: No respiratory distress.     Breath sounds: Normal breath sounds. No wheezing.  Abdominal:     General: Bowel sounds are normal.     Palpations: Abdomen is soft.     Tenderness: There is no abdominal tenderness.  Musculoskeletal:        General: No swelling or tenderness.  Lymphadenopathy:     Cervical: No cervical adenopathy.  Skin:    Findings: No erythema or rash.  Neurological:     Mental Status: She is alert.  Psychiatric:        Mood and Affect: Mood normal.        Behavior: Behavior normal.     BP 124/64   Pulse 67   Temp (!) 96.5 F (35.8 C)   Resp 16   Wt 154 lb (69.9 kg)   LMP 08/09/2012   SpO2 99%   BMI 26.43 kg/m  Wt Readings from Last 3 Encounters:  07/25/19 154 lb (69.9 kg)  06/07/19 150 lb 6.4 oz (68.2 kg)  04/13/19 149 lb (67.6 kg)     Lab Results  Component Value Date   WBC 6.8 07/25/2019   HGB 13.4 07/25/2019   HCT 40.2 07/25/2019   PLT 228.0 07/25/2019   GLUCOSE 97 07/25/2019   CHOL 181 06/07/2019   TRIG 75.0  06/07/2019   HDL 75.00 06/07/2019   LDLCALC 91 06/07/2019   ALT 10 07/25/2019   AST 17 07/25/2019   NA 137 07/25/2019   K 4.6 07/25/2019   CL 102 07/25/2019   CREATININE 0.76 07/25/2019   BUN 7 07/25/2019   CO2 28 07/25/2019   TSH 3.13 06/07/2019   HGBA1C 5.8 06/07/2019       Assessment & Plan:   Problem List Items Addressed This Visit    Abdominal pain    Has seen GI.  Had CT and EGD as outlined.  Given persistent pain, will recheck liver panel and pancreas enzymes to assess for any abnormality.  Continue aciphex.        Relevant Orders   CBC with Differential/Platelet (Completed)   Hepatic function panel (Completed)   Basic metabolic panel (Completed)   Amylase (Completed)   Lipase (Completed)   Aortic atherosclerosis (HCC)    On mevacor.       Relevant Orders   Ambulatory referral to Urology   Chronic interstitial cystitis    Being followed by urology.  States she is off imipramine now. Desires not to restart.  Persistent issues.  F/u with urology.        Relevant Orders   Ambulatory referral to Urology   GERD (gastroesophageal reflux disease)    On aciphex.        Hypercholesterolemia    Low cholesterol diet and exercise.  On lovastatin.  Follow lipid panel and liver function tests.        Stress    Increased stress. Discussed with her today.  Wants to hold on any further intervention.  Follow.            Einar Pheasant, MD

## 2019-07-26 LAB — HEPATIC FUNCTION PANEL
ALT: 10 U/L (ref 0–35)
AST: 17 U/L (ref 0–37)
Albumin: 4.4 g/dL (ref 3.5–5.2)
Alkaline Phosphatase: 78 U/L (ref 39–117)
Bilirubin, Direct: 0 mg/dL (ref 0.0–0.3)
Total Bilirubin: 0.5 mg/dL (ref 0.2–1.2)
Total Protein: 6.9 g/dL (ref 6.0–8.3)

## 2019-07-26 LAB — BASIC METABOLIC PANEL
BUN: 7 mg/dL (ref 6–23)
CO2: 28 mEq/L (ref 19–32)
Calcium: 9.3 mg/dL (ref 8.4–10.5)
Chloride: 102 mEq/L (ref 96–112)
Creatinine, Ser: 0.76 mg/dL (ref 0.40–1.20)
GFR: 73.31 mL/min (ref >60.00)
Glucose, Bld: 97 mg/dL (ref 70–99)
Potassium: 4.6 mEq/L (ref 3.5–5.1)
Sodium: 137 mEq/L (ref 135–145)

## 2019-07-26 LAB — CBC WITH DIFFERENTIAL/PLATELET
Basophils Absolute: 0.1 10*3/uL (ref 0.0–0.1)
Basophils Relative: 1 % (ref 0.0–3.0)
Eosinophils Absolute: 0.1 10*3/uL (ref 0.0–0.7)
Eosinophils Relative: 1.3 % (ref 0.0–5.0)
HCT: 40.2 % (ref 36.0–46.0)
Hemoglobin: 13.4 g/dL (ref 12.0–15.0)
Lymphocytes Relative: 31 % (ref 12.0–46.0)
Lymphs Abs: 2.1 10*3/uL (ref 0.7–4.0)
MCHC: 33.2 g/dL (ref 30.0–36.0)
MCV: 89.2 fl (ref 78.0–100.0)
Monocytes Absolute: 0.6 10*3/uL (ref 0.1–1.0)
Monocytes Relative: 9.1 % (ref 3.0–12.0)
Neutro Abs: 3.9 10*3/uL (ref 1.4–7.7)
Neutrophils Relative %: 57.6 % (ref 43.0–77.0)
Platelets: 228 10*3/uL (ref 150.0–400.0)
RBC: 4.51 Mil/uL (ref 3.87–5.11)
RDW: 13.9 % (ref 11.5–15.5)
WBC: 6.8 10*3/uL (ref 4.0–10.5)

## 2019-07-26 LAB — AMYLASE: Amylase: 26 U/L — ABNORMAL LOW (ref 27–131)

## 2019-07-26 LAB — LIPASE: Lipase: 36 U/L (ref 11.0–59.0)

## 2019-07-31 ENCOUNTER — Encounter: Payer: Self-pay | Admitting: Internal Medicine

## 2019-07-31 NOTE — Assessment & Plan Note (Signed)
Has seen GI.  Had CT and EGD as outlined.  Given persistent pain, will recheck liver panel and pancreas enzymes to assess for any abnormality.  Continue aciphex.

## 2019-07-31 NOTE — Assessment & Plan Note (Signed)
On aciphex.

## 2019-07-31 NOTE — Assessment & Plan Note (Signed)
Increased stress. Discussed with her today.  Wants to hold on any further intervention.  Follow.

## 2019-07-31 NOTE — Assessment & Plan Note (Signed)
Low cholesterol diet and exercise.  On lovastatin.  Follow lipid panel and liver function tests.   

## 2019-07-31 NOTE — Assessment & Plan Note (Signed)
Being followed by urology.  States she is off imipramine now. Desires not to restart.  Persistent issues.  F/u with urology.

## 2019-07-31 NOTE — Addendum Note (Signed)
Addended by: Alisa Graff on: 07/31/2019 11:15 AM   Modules accepted: Orders

## 2019-07-31 NOTE — Assessment & Plan Note (Signed)
On mevacor.  

## 2019-08-30 ENCOUNTER — Other Ambulatory Visit: Payer: Self-pay

## 2019-08-30 ENCOUNTER — Encounter: Payer: Self-pay | Admitting: Urology

## 2019-08-30 ENCOUNTER — Ambulatory Visit: Payer: Medicare Other | Admitting: Urology

## 2019-08-30 VITALS — BP 187/72 | HR 81 | Ht 64.0 in | Wt 153.0 lb

## 2019-08-30 DIAGNOSIS — N3010 Interstitial cystitis (chronic) without hematuria: Secondary | ICD-10-CM | POA: Diagnosis not present

## 2019-08-30 DIAGNOSIS — N3281 Overactive bladder: Secondary | ICD-10-CM | POA: Diagnosis not present

## 2019-08-30 LAB — MICROSCOPIC EXAMINATION
Bacteria, UA: NONE SEEN
RBC, Urine: NONE SEEN /hpf (ref 0–2)
WBC, UA: NONE SEEN /hpf (ref 0–5)

## 2019-08-30 LAB — URINALYSIS, COMPLETE
Bilirubin, UA: NEGATIVE
Glucose, UA: NEGATIVE
Ketones, UA: NEGATIVE
Leukocytes,UA: NEGATIVE
Nitrite, UA: NEGATIVE
Protein,UA: NEGATIVE
RBC, UA: NEGATIVE
Specific Gravity, UA: <1.005 — ABNORMAL LOW (ref 1.005–1.030)
Urobilinogen, Ur: 0.2 mg/dL (ref 0.2–1.0)
pH, UA: 5.5 (ref 5.0–7.5)

## 2019-08-30 LAB — BLADDER SCAN AMB NON-IMAGING

## 2019-08-30 MED ORDER — IMIPRAMINE HCL 10 MG PO TABS: 10.0000 mg | ORAL_TABLET | Freq: Every day | ORAL | 3 refills | Status: DC

## 2019-08-30 NOTE — Progress Notes (Signed)
08/30/2019 4:58 PM   Rachael Austin [birth year] [id number]  Referring provider: Einar Pheasant, [id number] [place],  [place] [address]  Chief Complaint  Patient presents with  . Over Active Bladder    HPI: 78 year old female with presumed IC and OAB type symptoms who returns today sooner than scheduled.  In the past, she has been seen by multiple urologist: Dr. Amalia Hailey, Dr. Jacqlyn Larsen, and Dr. Arther Dames.  Please see previous notes for details.  She was last seen and evaluated by me in 10/2018 at which time she was noted to have an elevated PVR.  Her imipramine dose was changed to every other day and her PVR improved.  Today, she reports that she stopped taking her imipramine altogether.  She cannot recall why.  She stopped taking this shortly after her visit with [place] in February.  She seems a little bit confused today about her diagnosis as well as symptoms.  She reports that she has symptoms that her epigastric and mid abdomen with bladder filling which improves with urination.  She believes that she has cysts in her bladder and not interstitial cystitis.  She is able to recall however other details about her diagnosis which are accurate.  She tried multiple other agents in the past including anticholinergics, Myrbetriq, Elmiron, etc.  She been worried about cost and did not have good response to these medications.  She is never tried rescue solution.  This has been discussed in the past as an option as well as cystoscopy with hydrodistention.    She also reports today that she had 3 cups of coffee this morning.  She is not been following an IC diet.   PMH: Past Medical History:  Diagnosis Date  . Breast screening, unspecified 2013  . Cancer ([place]) 1992   skin  . Degenerative disk disease   . GERD (gastroesophageal reflux disease)   . Hypercholesterolemia 2008  . Interstitial cystitis    followed by Dr Jacqlyn Larsen  . Other sign and symptom in breast 2013   Left  upper outer quadrant breast "soreness" Ultrasound exam of right breast in the 2 o'clock position with the breast distracted medially showed a 0.3-0.4 with 0.5 cm simple cyst. In the 1 o'clock position where pt reported tenderness US exam was negatiive.  Because of her history of intermittent nipple drainage, ultrasound was completed of the retroareolar area.  . Personal history of tobacco use, presenting hazards to health   . PONV (postoperative nausea and vomiting)   . Special screening for malignant neoplasms, colon     Surgical History: Past Surgical History:  Procedure Laterality Date  . ABDOMINAL HYSTERECTOMY  1992  . APPENDECTOMY    . CERVICAL DISCECTOMY     S/P C7-T1 discectomy with fusion  . CHOLECYSTECTOMY  1990  . COLONOSCOPY WITH PROPOFOL N/A 02/14/2016   Procedure: COLONOSCOPY WITH PROPOFOL;  Surgeon: Lucilla Lame, MD;  Location: [facility];  Service: Endoscopy;  Laterality: N/A;  PT WOULD LIKE 10 ARRIVAL TIME OR LATER  . DILATION AND CURETTAGE OF UTERUS    . ESOPHAGOGASTRODUODENOSCOPY (EGD) WITH PROPOFOL N/A 02/14/2016   Procedure: ESOPHAGOGASTRODUODENOSCOPY (EGD) WITH PROPOFOL with dialtion;  Surgeon: Lucilla Lame, MD;  Location: [facility];  Service: Endoscopy;  Laterality: N/A;  . ESOPHAGOGASTRODUODENOSCOPY (EGD) WITH PROPOFOL N/A 04/13/2019   Procedure: ESOPHAGOGASTRODUODENOSCOPY (EGD) WITH PROPOFOL;  Surgeon: Toledo, Benay Pike, MD;  Location: [facility];  Service: Gastroenterology;  Laterality: N/A;  . MELANOMA EXCISION     removed  from Left calf 1994    Home Medications:  Allergies as of 08/30/2019      Reactions   Augmentin [amoxicillin-pot Clavulanate] Swelling, Rash   Swelling of the lips   Codeine Sulfate Other (See Comments)   "Makes her hyper"   Vesicare [solifenacin Succinate] Nausea And Vomiting, Rash      Medication List       Accurate as of August 30, 2019  4:58 PM. If you have any questions, ask your nurse or doctor.         acetaminophen 325 MG tablet Commonly known as: TYLENOL Take 650 mg by mouth as needed.   ibuprofen 200 MG tablet Commonly known as: ADVIL Take 200 mg by mouth every 6 (six) hours as needed.   imipramine 10 MG tablet Commonly known as: TOFRANIL Take 1 tablet (10 mg total) by mouth at bedtime. Started by: Hollice Espy, MD   lovastatin 20 MG tablet Commonly known as: MEVACOR TAKE 1 TABLET BY MOUTH AT BEDTIME   Melatonin 10 MG Caps Take by mouth as needed.   MULTI-VITAMIN DAILY PO Take 1 tablet by mouth daily.   RABEprazole 20 MG tablet Commonly known as: Aciphex Take 1 tablet (20 mg total) by mouth daily.       Allergies:  Allergies  Allergen Reactions  . Augmentin [Amoxicillin-Pot Clavulanate] Swelling and Rash    Swelling of the lips  . Codeine Sulfate Other (See Comments)    "Makes her hyper"  . Vesicare [Solifenacin Succinate] Nausea And Vomiting and Rash    Family History: Family History  Problem Relation Age of Onset  . Hodgkin's lymphoma Mother   . Heart failure Father   . Heart attack Father   . Arthritis Sister        Three sisters w/ degeneratve disk disease  . Headache Sister        Two sisters hx of headache  . Breast cancer Neg Hx   . Colon cancer Neg Hx   . Bladder Cancer Neg Hx   . Kidney cancer Neg Hx     Social History:  reports that she has quit smoking. Her smoking use included cigarettes. She has a 15.00 pack-year smoking history. She has never used smokeless tobacco. She reports that she does not drink alcohol or use drugs.  ROS: UROLOGY Frequent Urination?: Yes Hard to postpone urination?: Yes Burning/pain with urination?: No Get up at night to urinate?: Yes Leakage of urine?: Yes Urine stream starts and stops?: Yes Trouble starting stream?: No Do you have to strain to urinate?: No Blood in urine?: No Urinary tract infection?: No Sexually transmitted disease?: No Injury to kidneys or bladder?: No Painful intercourse?: No  Weak stream?: No Currently pregnant?: No Vaginal bleeding?: No Last menstrual period?: n  Gastrointestinal Nausea?: No Vomiting?: No Indigestion/heartburn?: Yes Diarrhea?: No Constipation?: Yes  Constitutional Fever: No Night sweats?: No Weight loss?: No Fatigue?: No  Skin Skin rash/lesions?: No Itching?: Yes  Eyes Blurred vision?: No Double vision?: No  Ears/Nose/Throat Sore throat?: No Sinus problems?: No  Hematologic/Lymphatic Swollen glands?: No Easy bruising?: Yes  Cardiovascular Leg swelling?: No Chest pain?: No  Respiratory Cough?: Yes Shortness of breath?: No  Endocrine Excessive thirst?: No  Musculoskeletal Back pain?: No Joint pain?: No  Neurological Headaches?: No Dizziness?: No  Psychologic Depression?: No Anxiety?: No  Physical Exam: BP (!) 187/72   Pulse 81   Ht 5\' 4"  (1.626 m)   Wt 153 lb (69.4 kg)   LMP 08/09/2012  BMI 26.26 kg/m   Constitutional:  Alert and oriented, No acute distress. HEENT: [place] AT, moist mucus membranes.  Trachea midline, no masses. Cardiovascular: No clubbing, cyanosis, or edema. Respiratory: Normal respiratory effort, no increased work of breathing. Skin: No rashes, bruises or suspicious lesions. Neurologic: Grossly intact, no focal deficits, moving all 4 extremities. Psychiatric: Normal mood and affect.  Laboratory Data: Lab Results  Component Value Date   WBC 6.8 07/25/2019   HGB 13.4 07/25/2019   HCT 40.2 07/25/2019   MCV 89.2 07/25/2019   PLT 228.0 07/25/2019    Lab Results  Component Value Date   CREATININE 0.76 07/25/2019    Lab Results  Component Value Date   HGBA1C 5.8 06/07/2019    Urinalysis Results for orders placed or performed in visit on 08/30/19  Microscopic Examination   URINE  Result Value Ref Range   WBC, UA None seen 0 - 5 /hpf   RBC None seen 0 - 2 /hpf   Epithelial Cells (non renal) 0-10 0 - 10 /hpf   Bacteria, UA None seen None seen/Few  Urinalysis,  Complete  Result Value Ref Range   Specific Gravity, UA <1.005 (L) 1.005 - 1.030   pH, UA 5.5 5.0 - 7.5   Color, UA Yellow Yellow   Appearance Ur Clear Clear   Leukocytes,UA Negative Negative   Protein,UA Negative Negative/Trace   Glucose, UA Negative Negative   Ketones, UA Negative Negative   RBC, UA Negative Negative   Bilirubin, UA Negative Negative   Urobilinogen, Ur 0.2 0.2 - 1.0 mg/dL   Nitrite, UA Negative Negative   Microscopic Examination See below:   Bladder Scan (Post Void Residual) in office  Result Value Ref Range   Scan Result 12ml     Assessment & Plan:    1. Interstitial cystitis Previously did well on imipramine but had incomplete bladder emptying  Recommend we resume this medication but a lower dose, 10 mg daily to which she is agreeable  We will continue to follow her closely and have her follow-up in February as previously scheduled  She was somewhat confused today about her diagnosis and other questions regarding medications, etc.  I typed up our conversation and included it with the AVS that she can share with her family members and reviewed at home.  Reminded of behavioral modification its importance in controlling urinary symptoms.  2. OAB (overactive bladder) As above, we will see if this improves with resumption of medication as above - Urinalysis, Complete - Bladder Scan (Post Void Residual) in office   Return for keep follow up with me early next year.  Hollice Espy, MD  [facility] [address] [place], [place] [address] [phone]

## 2019-08-30 NOTE — Patient Instructions (Signed)
You have a bladder condition called interstitial cystitis.  This is a chronic bladder pain syndrome no cure.  In the past, you have been told to avoid things like coffee which will likely exacerbate your symptoms i.e. make them worse.  Your bladder pain had improved on imipramine 25 mg that [facility] used to prescribed.  This however was causing you to retain urine and not emptying your bladder completely.  Earlier this year, we recommended that you cut back to every other day but it seems like you just stop your medication altogether.  This may be why your symptoms are worse again.  I will be prescribing the imipramine again, this time at a lower dose 10 mg daily.  If you have any trouble emptying her bladder, please return.  If your pain does not improve, please return.  If you have any questions or concerns, please return or call or send a MyChart message.

## 2019-09-03 ENCOUNTER — Encounter: Payer: Self-pay | Admitting: Emergency Medicine

## 2019-09-03 ENCOUNTER — Emergency Department: Payer: Medicare Other

## 2019-09-03 ENCOUNTER — Emergency Department
Admission: EM | Admit: 2019-09-03 | Discharge: 2019-09-03 | Disposition: A | Discharge status: Home / Self Care | Payer: Medicare Other | Attending: Emergency Medicine | Admitting: Emergency Medicine

## 2019-09-03 ENCOUNTER — Other Ambulatory Visit: Payer: Self-pay

## 2019-09-03 ENCOUNTER — Other Ambulatory Visit: Payer: Self-pay | Admitting: Internal Medicine

## 2019-09-03 DIAGNOSIS — U071 COVID-19: Secondary | ICD-10-CM

## 2019-09-03 DIAGNOSIS — R079 Chest pain, unspecified: Secondary | ICD-10-CM | POA: Diagnosis present

## 2019-09-03 DIAGNOSIS — Z87891 Personal history of nicotine dependence: Secondary | ICD-10-CM | POA: Insufficient documentation

## 2019-09-03 DIAGNOSIS — R42 Dizziness and giddiness: Secondary | ICD-10-CM | POA: Diagnosis not present

## 2019-09-03 DIAGNOSIS — Z79899 Other long term (current) drug therapy: Secondary | ICD-10-CM | POA: Insufficient documentation

## 2019-09-03 LAB — BASIC METABOLIC PANEL
Anion gap: 8 (ref 5–15)
BUN: 7 mg/dL — ABNORMAL LOW (ref 8–23)
CO2: 24 mmol/L (ref 22–32)
Calcium: 8.6 mg/dL — ABNORMAL LOW (ref 8.9–10.3)
Chloride: 101 mmol/L (ref 98–111)
Creatinine, Ser: 0.79 mg/dL (ref 0.44–1.00)
GFR calc Af Amer: >60 mL/min (ref >60)
GFR calc non Af Amer: >60 mL/min (ref >60)
Glucose, Bld: 108 mg/dL — ABNORMAL HIGH (ref 70–99)
Potassium: 4.4 mmol/L (ref 3.5–5.1)
Sodium: 133 mmol/L — ABNORMAL LOW (ref 135–145)

## 2019-09-03 LAB — URINALYSIS, COMPLETE (UACMP) WITH MICROSCOPIC
Bacteria, UA: NONE SEEN
Bilirubin Urine: NEGATIVE
Glucose, UA: NEGATIVE mg/dL
Hgb urine dipstick: NEGATIVE
Ketones, ur: NEGATIVE mg/dL
Leukocytes,Ua: NEGATIVE
Nitrite: NEGATIVE
Protein, ur: NEGATIVE mg/dL
Specific Gravity, Urine: 1.005 (ref 1.005–1.030)
pH: 6 (ref 5.0–8.0)

## 2019-09-03 LAB — POC SARS CORONAVIRUS 2 AG: SARS Coronavirus 2 Ag: POSITIVE — AB

## 2019-09-03 LAB — CBC
HCT: 37.8 % (ref 36.0–46.0)
Hemoglobin: 12.9 g/dL (ref 12.0–15.0)
MCH: 29.6 pg (ref 26.0–34.0)
MCHC: 34.1 g/dL (ref 30.0–36.0)
MCV: 86.7 fL (ref 80.0–100.0)
Platelets: 172 10*3/uL (ref 150–400)
RBC: 4.36 MIL/uL (ref 3.87–5.11)
RDW: 13.7 % (ref 11.5–15.5)
WBC: 4.3 10*3/uL (ref 4.0–10.5)
nRBC: 0 % (ref 0.0–0.2)

## 2019-09-03 LAB — INFLUENZA PANEL BY PCR (TYPE A & B)
Influenza A By PCR: NEGATIVE
Influenza B By PCR: NEGATIVE

## 2019-09-03 LAB — TROPONIN I (HIGH SENSITIVITY)
Troponin I (High Sensitivity): 5 ng/L (ref <18)
Troponin I (High Sensitivity): 5 ng/L (ref <18)

## 2019-09-03 MED ORDER — ONDANSETRON 4 MG PO TBDP: 4.0000 mg | ORAL_TABLET | Freq: Three times a day (TID) | ORAL | 0 refills | Status: DC | PRN

## 2019-09-03 NOTE — ED Triage Notes (Signed)
Pt arrived via POV with reports of feeling tired, having diarrhea and nausea and central chest pressure after waking up.  Pt reports the chest pressure radiated to her back, denies any SOB.  Pt states she felt like she was going to pass out.

## 2019-09-03 NOTE — Discharge Instructions (Signed)
Please call your primary care provider if you have any questions or concerns regarding COVID-19.  Take the Zofran if needed for nausea.  Rest, keep well-hydrated, and quarantine for the next 14 days.  If you develop a fever, please take Tylenol or ibuprofen.  Return to the emergency department for any urgent concerns if unable to see primary care.

## 2019-09-03 NOTE — ED Triage Notes (Signed)
FIRST NURSE NOTE- here for chest pressure.  Pulled next for EKG. NAD at this time.

## 2019-09-03 NOTE — ED Provider Notes (Signed)
[facility] Emergency Department Provider Note ____________________________________________   First MD Initiated Contact with Patient 09/03/19 1230     (approximate)  I have reviewed the triage vital signs and the nursing notes.   HISTORY  Chief Complaint Chest Pain and Near Syncope  HPI Rachael Austin is a 78 y.o. female who presents to the emergency department for treatment and evaluation of chest pressure that started upon awakening this morning.  Since yesterday, she has felt very tired and has had a couple of episodes of diarrhea and nausea without vomiting this morning.  She states this morning while she was attempting to make her bed, she felt "woozy" and decided to lay back down.  She did not actually pass out or lose consciousness.  This feeling lasted for just a few minutes and resolved without intervention.  She went to her PCP and was sent to the ER for further evaluation.  Patient denies any symptoms currently with the exception of fatigue.  She is concerned that she may have COVID-19 although she has not had any known exposure.  She has had no history of CAD, MI, or other cardiac disease.  She has not been evaluated by cardiology for over 20 years.     Past Medical History:  Diagnosis Date  . Breast screening, unspecified 2013  . Cancer ([place]) 1992   skin  . Degenerative disk disease   . GERD (gastroesophageal reflux disease)   . Hypercholesterolemia 2008  . Interstitial cystitis    followed by Dr Jacqlyn Larsen  . Other sign and symptom in breast 2013   Left upper outer quadrant breast "soreness" Ultrasound exam of right breast in the 2 o'clock position with the breast distracted medially showed a 0.3-0.4 with 0.5 cm simple cyst. In the 1 o'clock position where pt reported tenderness US exam was negatiive.  Because of her history of intermittent nipple drainage, ultrasound was completed of the retroareolar area.  . Personal history of tobacco use,  presenting hazards to health   . PONV (postoperative nausea and vomiting)   . Special screening for malignant neoplasms, colon     Patient Active Problem List   Diagnosis Date Noted  . Chest tightness 10/04/2017  . Aortic atherosclerosis ([place]) 07/02/2017  . Special screening for malignant neoplasms, colon   . Hiatal hernia   . H/O: osteoarthritis 02/05/2016  . H/O neoplasm 02/19/2015  . H/O Malignant melanoma 02/19/2015  . Elevated blood pressure reading 02/14/2015  . Health care maintenance 02/14/2015  . Abnormal involuntary movement 07/27/2014  . Stress 05/09/2014  . Abdominal pain 02/21/2014  . Difficulty hearing 08/16/2013  . GERD (gastroesophageal reflux disease) 06/17/2013  . Acid reflux 06/17/2013  . Detrusor dyssynergia 02/04/2013  . Tremor   . Hypercholesterolemia 08/09/2012  . Chronic interstitial cystitis 08/09/2012    Past Surgical History:  Procedure Laterality Date  . ABDOMINAL HYSTERECTOMY  1992  . APPENDECTOMY    . CERVICAL DISCECTOMY     S/P C7-T1 discectomy with fusion  . CHOLECYSTECTOMY  1990  . COLONOSCOPY WITH PROPOFOL N/A 02/14/2016   Procedure: COLONOSCOPY WITH PROPOFOL;  Surgeon: Lucilla Lame, MD;  Location: [facility];  Service: Endoscopy;  Laterality: N/A;  PT WOULD LIKE 10 ARRIVAL TIME OR LATER  . DILATION AND CURETTAGE OF UTERUS    . ESOPHAGOGASTRODUODENOSCOPY (EGD) WITH PROPOFOL N/A 02/14/2016   Procedure: ESOPHAGOGASTRODUODENOSCOPY (EGD) WITH PROPOFOL with dialtion;  Surgeon: Lucilla Lame, MD;  Location: [facility];  Service: Endoscopy;  Laterality: N/A;  .  ESOPHAGOGASTRODUODENOSCOPY (EGD) WITH PROPOFOL N/A 04/13/2019   Procedure: ESOPHAGOGASTRODUODENOSCOPY (EGD) WITH PROPOFOL;  Surgeon: Toledo, Benay Pike, MD;  Location: [facility];  Service: Gastroenterology;  Laterality: N/A;  . MELANOMA EXCISION     removed from Left calf 1994    Prior to Admission medications   Medication Sig Start Date End Date Taking? Authorizing  Provider  acetaminophen (TYLENOL) 325 MG tablet Take 650 mg by mouth as needed.    [provider]  ibuprofen (ADVIL,MOTRIN) 200 MG tablet Take 200 mg by mouth every 6 (six) hours as needed.    [provider]  imipramine (TOFRANIL) 10 MG tablet Take 1 tablet (10 mg total) by mouth at bedtime. 08/30/19   Hollice Espy, MD  lovastatin (MEVACOR) 20 MG tablet TAKE 1 TABLET BY MOUTH AT BEDTIME 05/10/19   Einar Pheasant, MD  Melatonin 10 MG CAPS Take by mouth as needed.     [provider]  Multiple Vitamin (MULTI-VITAMIN DAILY PO) Take 1 tablet by mouth daily.    [provider]  ondansetron (ZOFRAN-ODT) 4 MG disintegrating tablet Take 1 tablet (4 mg total) by mouth every 8 (eight) hours as needed for nausea or vomiting. 09/03/19   , Johnette Abraham B, FNP  RABEprazole (ACIPHEX) 20 MG tablet Take 1 tablet (20 mg total) by mouth daily. 06/07/19   Einar Pheasant, MD    Allergies Augmentin [amoxicillin-pot clavulanate], Codeine sulfate, and Vesicare [solifenacin succinate]  Family History  Problem Relation Age of Onset  . Hodgkin's lymphoma Mother   . Heart failure Father   . Heart attack Father   . Arthritis Sister        Three sisters w/ degeneratve disk disease  . Headache Sister        Two sisters hx of headache  . Breast cancer Neg Hx   . Colon cancer Neg Hx   . Bladder Cancer Neg Hx   . Kidney cancer Neg Hx     Social History Social History   Tobacco Use  . Smoking status: Former Smoker    Packs/day: 1.00    Years: 15.00    Pack years: 15.00    Types: Cigarettes  . Smokeless tobacco: Never Used  Substance Use Topics  . Alcohol use: No    Alcohol/week: 0.0 standard drinks  . Drug use: No    Review of Systems  Constitutional: No fever/chills Eyes: No visual changes. ENT: No sore throat. Cardiovascular: Positive for chest pressure this morning which is now resolved. Respiratory: Denies shortness of breath. Gastrointestinal: No abdominal  pain.  Positive for nausea, no vomiting.  Positive for diarrhea.  No constipation. Genitourinary: Negative for dysuria. Musculoskeletal: Positive for back ache. Skin: Negative for rash. Neurological: Negative for headaches, focal weakness or numbness. ___________________________________________   PHYSICAL EXAM:  VITAL SIGNS: ED Triage Vitals  Enc Vitals Group     BP 09/03/19 1113 (!) 169/79     Pulse Rate 09/03/19 1113 77     Resp 09/03/19 1113 18     Temp 09/03/19 1113 98.1 F (36.7 C)     Temp Source 09/03/19 1113 Oral     SpO2 09/03/19 1113 99 %     Weight 09/03/19 1114 153 lb (69.4 kg)     Height 09/03/19 1114 5\' 4"  (1.626 m)     Head Circumference --      Peak Flow --      Pain Score --      Pain Loc --      Pain  Edu? --      Excl. in [place]? --     Constitutional: Alert and oriented. Well appearing and in no acute distress. Eyes: Conjunctivae are normal. PERRL. EOMI. Head: Atraumatic. Nose: No congestion/rhinnorhea. Mouth/Throat: Mucous membranes are moist.  Oropharynx non-erythematous. Neck: No stridor.   Hematological/Lymphatic/Immunilogical: No cervical lymphadenopathy. Cardiovascular: Normal rate, regular rhythm. Grossly normal heart sounds.  Good peripheral circulation. Respiratory: Normal respiratory effort.  No retractions. Lungs CTAB. Gastrointestinal: Soft and nontender. No distention. No abdominal bruits. No CVA tenderness. Genitourinary:  Musculoskeletal: No lower extremity tenderness nor edema.  No joint effusions. Neurologic:  Normal speech and language. No gross focal neurologic deficits are appreciated. No gait instability. Skin:  Skin is warm, dry and intact. No rash noted. Psychiatric: Mood and affect are normal. Speech and behavior are normal.  ____________________________________________   LABS (all labs ordered are listed, but only abnormal results are displayed)  Labs Reviewed  BASIC METABOLIC PANEL - Abnormal; Notable for the following  components:      Result Value   Sodium 133 (*)    Glucose, Bld 108 (*)    BUN 7 (*)    Calcium 8.6 (*)    All other components within normal limits  URINALYSIS, COMPLETE (UACMP) WITH MICROSCOPIC - Abnormal; Notable for the following components:   Color, Urine YELLOW (*)    APPearance CLEAR (*)    All other components within normal limits  POC SARS CORONAVIRUS 2 AG - Abnormal; Notable for the following components:   SARS Coronavirus 2 Ag POSITIVE (*)    All other components within normal limits  CBC  INFLUENZA PANEL BY PCR (TYPE A & B)  POC SARS CORONAVIRUS 2 AG -  ED  TROPONIN I (HIGH SENSITIVITY)  TROPONIN I (HIGH SENSITIVITY)   ____________________________________________  EKG  ED ECG REPORT I,  , FNP-BC personally viewed and interpreted this ECG.   Date: 09/03/2019  EKG Time: 1110  Rate: 79  Rhythm: normal EKG, normal sinus rhythm, unchanged from previous tracings, RBBB  Axis: normal  Intervals:right bundle branch block  ST&T Change: no ST elevation  ____________________________________________  RADIOLOGY  ED MD interpretation:    No cardiopulmonary abnormality per radiology.  Official radiology report(s): Dg Chest 2 View  Result Date: 09/03/2019 CLINICAL DATA:  Chest pressure EXAM: CHEST - 2 VIEW COMPARISON:  Chest radiograph dated O 10/01/2017. FINDINGS: The heart size and mediastinal contours are within normal limits. Both lungs are clear. Fixation hardware overlies the lower cervical spine. IMPRESSION: No active cardiopulmonary disease. Electronically Signed   By: Zerita Boers M.D.   On: 09/03/2019 12:11    ____________________________________________   PROCEDURES  Procedure(s) performed (including Critical Care):  Procedures  ____________________________________________   INITIAL IMPRESSION / ASSESSMENT AND PLAN     78 year old female presenting to the emergency department with concern of COVID-19.  See HPI for further details.   Protocols were completed prior to assignment in ER.  Initial labs are reassuring including a troponin of 5.  Plan will be to test for influenza and COVID-19 and repeat a serial troponin.  EKG is reassuring and does not show any changes since previous.  DIFFERENTIAL DIAGNOSIS  Influenza, COVID-19, UTI, cardiology etiology  ED COURSE  PCR COVID-19 screening is positive.  Patient and daughter updated.  Quarantine discussed and recommended.  Awaiting second troponin and results of urinalysis.  Plan will be to discharge home with Zofran for nausea.  Otherwise, patient does not appear that she will need additional treatments.  She  has no history of lung disease or cardiac issues.  Oxygen saturation is 99% on room air and she has no dyspnea on exertion.  Patient was encouraged to follow-up with her primary care provider or call for any questions.  She was encouraged to see primary care or return to the emergency department for urgent concerns.  ____________________________________________   FINAL CLINICAL IMPRESSION(S) / ED DIAGNOSES  Final diagnoses:  [id number]     ED Discharge Orders         Ordered    ondansetron (ZOFRAN-ODT) 4 MG disintegrating tablet  Every 8 hours PRN     09/03/19 1442           Note:  This document was prepared using Dragon voice recognition software and may include unintentional dictation errors.   Victorino Dike, FNP 09/03/19 1447    Vanessa [place], MD 09/04/19 1130

## 2019-09-05 ENCOUNTER — Other Ambulatory Visit: Payer: Self-pay | Admitting: Internal Medicine

## 2019-09-05 MED ORDER — RABEPRAZOLE SODIUM 20 MG PO TBEC: 20.0000 mg | DELAYED_RELEASE_TABLET | Freq: Every day | ORAL | 2 refills | Status: DC

## 2019-09-05 NOTE — Telephone Encounter (Signed)
Medication Refill - Medication: RABEprazole (ACIPHEX) 20 MG tablet    Has the patient contacted their pharmacy? Yes.   Pt states she only has one pill left for tomorrow morning. Please advise.  (Agent: If no, request that the patient contact the pharmacy for the refill.) (Agent: If yes, when and what did the pharmacy advise?)  Preferred Pharmacy (with phone number or street name):  [place], [place] - [address]  [place] [address]  Phone: [phone] Fax: [phone]  Not a 24 hour pharmacy; exact hours not known.     Agent: Please be advised that RX refills may take up to 3 business days. We ask that you follow-up with your pharmacy.

## 2019-09-15 ENCOUNTER — Other Ambulatory Visit: Payer: Self-pay | Admitting: *Deleted

## 2019-09-15 DIAGNOSIS — N3281 Overactive bladder: Secondary | ICD-10-CM

## 2019-09-15 MED ORDER — IMIPRAMINE HCL 10 MG PO TABS: 10.0000 mg | ORAL_TABLET | Freq: Every day | ORAL | 3 refills | Status: DC

## 2019-09-15 NOTE — Telephone Encounter (Signed)
Patient called stated, [facility] did not receive original prescription-called and verified and resent.

## 2019-11-02 ENCOUNTER — Ambulatory Visit: Payer: Medicare Other | Admitting: Internal Medicine

## 2019-11-07 ENCOUNTER — Other Ambulatory Visit: Payer: Self-pay

## 2019-11-07 ENCOUNTER — Ambulatory Visit (INDEPENDENT_AMBULATORY_CARE_PROVIDER_SITE_OTHER): Payer: Medicare PPO | Admitting: Internal Medicine

## 2019-11-07 VITALS — Ht 64.0 in | Wt 153.0 lb

## 2019-11-07 DIAGNOSIS — E7800 Pure hypercholesterolemia, unspecified: Secondary | ICD-10-CM | POA: Diagnosis not present

## 2019-11-07 DIAGNOSIS — F439 Reaction to severe stress, unspecified: Secondary | ICD-10-CM | POA: Diagnosis not present

## 2019-11-07 DIAGNOSIS — I700 Atherosclerosis of aorta: Secondary | ICD-10-CM

## 2019-11-07 DIAGNOSIS — Z85820 Personal history of malignant melanoma of skin: Secondary | ICD-10-CM

## 2019-11-07 DIAGNOSIS — K219 Gastro-esophageal reflux disease without esophagitis: Secondary | ICD-10-CM | POA: Diagnosis not present

## 2019-11-07 DIAGNOSIS — Z8616 Personal history of COVID-19: Secondary | ICD-10-CM

## 2019-11-07 MED ORDER — ESOMEPRAZOLE MAGNESIUM 40 MG PO CPDR: 40.0000 mg | DELAYED_RELEASE_CAPSULE | Freq: Every day | ORAL | 2 refills | Status: DC

## 2019-11-07 MED ORDER — FAMOTIDINE 40 MG PO TABS: 40.0000 mg | ORAL_TABLET | Freq: Every day | ORAL | 2 refills | Status: DC

## 2019-11-07 NOTE — Progress Notes (Signed)
[name], female   DOB: [birth year], 79 y.o.   MRN: [id number]   Virtual Visit via telephone Note  This visit type was conducted due to national recommendations for restrictions regarding the COVID-19 pandemic (e.g. social distancing).  This format is felt to be most appropriate for this patient at this time.  All issues noted in this document were discussed and addressed.  No physical exam was performed (except for noted visual exam findings with Video Visits).   I connected with Marland Kitchen by telephone and verified that I am speaking with the correct person using two identifiers. Location patient: home Location provider: work  Persons participating in the telephone visit: patient, provider  The limitations, risks, security and privacy concerns of performing an evaluation and management service by telephone and the availability of in person appointments have been discussed.  The patient expressed understanding and agreed to proceed.   Reason for visit: scheduled follow up.    HPI: Was evaluated on 09/03/19 in ER for evaluation chest pressure, fatigue and diarrhea/nausea.  Tested positive for covid.  Discharged home with zofran and nausea.  She is feeling better, but does still have some fatigue.  No chest pain.  Breathing stable. No nausea or vomiting now.  Taking probiotics.  Taking aciphex bid now. Does not feel this works for her.  GI had previously documented dexilant controlled.  Last visit, reported not helping.  Was placed on aciphex.  Does not feel this is working for her.  Increased acid reflux.  Some constipation.  Still with urinary issues.  Plans to see urology next week.     ROS: See pertinent positives and negatives per HPI.  Past Medical History:  Diagnosis Date  . Breast screening, unspecified 2013  . Cancer ([place]) 1992   skin  . Degenerative disk disease   . GERD (gastroesophageal reflux disease)   . Hypercholesterolemia 2008  . Interstitial  cystitis    followed by Dr Jacqlyn Larsen  . Other sign and symptom in breast 2013   Left upper outer quadrant breast "soreness" Ultrasound exam of right breast in the 2 o'clock position with the breast distracted medially showed a 0.3-0.4 with 0.5 cm simple cyst. In the 1 o'clock position where pt reported tenderness US exam was negatiive.  Because of her history of intermittent nipple drainage, ultrasound was completed of the retroareolar area.  . Personal history of tobacco use, presenting hazards to health   . PONV (postoperative nausea and vomiting)   . Special screening for malignant neoplasms, colon     Past Surgical History:  Procedure Laterality Date  . ABDOMINAL HYSTERECTOMY  1992  . APPENDECTOMY    . CERVICAL DISCECTOMY     S/P C7-T1 discectomy with fusion  . CHOLECYSTECTOMY  1990  . COLONOSCOPY WITH PROPOFOL N/A 02/14/2016   Procedure: COLONOSCOPY WITH PROPOFOL;  Surgeon: Lucilla Lame, MD;  Location: [facility];  Service: Endoscopy;  Laterality: N/A;  PT WOULD LIKE 10 ARRIVAL TIME OR LATER  . DILATION AND CURETTAGE OF UTERUS    . ESOPHAGOGASTRODUODENOSCOPY (EGD) WITH PROPOFOL N/A 02/14/2016   Procedure: ESOPHAGOGASTRODUODENOSCOPY (EGD) WITH PROPOFOL with dialtion;  Surgeon: Lucilla Lame, MD;  Location: [facility];  Service: Endoscopy;  Laterality: N/A;  . ESOPHAGOGASTRODUODENOSCOPY (EGD) WITH PROPOFOL N/A 04/13/2019   Procedure: ESOPHAGOGASTRODUODENOSCOPY (EGD) WITH PROPOFOL;  Surgeon: Toledo, Benay Pike, MD;  Location: [facility];  Service: Gastroenterology;  Laterality: N/A;  . MELANOMA EXCISION     removed from Left calf 1994  Family History  Problem Relation Age of Onset  . Hodgkin's lymphoma Mother   . Heart failure Father   . Heart attack Father   . Arthritis Sister        Three sisters w/ degeneratve disk disease  . Headache Sister        Two sisters hx of headache  . Breast cancer Neg Hx   . Colon cancer Neg Hx   . Bladder Cancer Neg Hx   . Kidney  cancer Neg Hx     SOCIAL HX: reviewed.    Current Outpatient Medications:  .  acetaminophen (TYLENOL) 325 MG tablet, Take 650 mg by mouth as needed., Disp: , Rfl:  .  esomeprazole (NEXIUM) 40 MG capsule, Take 1 capsule (40 mg total) by mouth daily., Disp: 30 capsule, Rfl: 2 .  famotidine (PEPCID) 40 MG tablet, Take 1 tablet (40 mg total) by mouth daily., Disp: 30 tablet, Rfl: 2 .  ibuprofen (ADVIL,MOTRIN) 200 MG tablet, Take 200 mg by mouth every 6 (six) hours as needed., Disp: , Rfl:  .  imipramine (TOFRANIL) 10 MG tablet, Take 1 tablet (10 mg total) by mouth at bedtime., Disp: 90 tablet, Rfl: 3 .  lovastatin (MEVACOR) 20 MG tablet, TAKE 1 TABLET BY MOUTH AT BEDTIME, Disp: 90 tablet, Rfl: 1 .  Melatonin 10 MG CAPS, Take by mouth as needed. , Disp: , Rfl:  .  Multiple Vitamin (MULTI-VITAMIN DAILY PO), Take 1 tablet by mouth daily., Disp: , Rfl:  .  ondansetron (ZOFRAN-ODT) 4 MG disintegrating tablet, Take 1 tablet (4 mg total) by mouth every 8 (eight) hours as needed for nausea or vomiting., Disp: 20 tablet, Rfl: 0  EXAM:  GENERAL: alert.  Sounds to be in no acute distress.  Answering questions appropriately.    PSYCH/NEURO: pleasant and cooperative, no obvious depression or anxiety, speech and thought processing grossly intact  ASSESSMENT AND PLAN:  Discussed the following assessment and plan:  Acid reflux S/p EGD - gastritis. On aciphex now.  Feels not working. Change to nexium.  Follow closely.  Get her back in soon to reassess.    Aortic atherosclerosis (HCC) On mevacor.   GERD (gastroesophageal reflux disease) Reflux as outlined.  Raw foods and tomatoes aaggravate.  dexilant not working.  Change to nexium.  Schedule f/u soon to reassess.    Stress Overall handling things well.  Follow.   Hypercholesterolemia Low cholesterol diet and exercise.  Follow lipid panel and liver function tests.  On mevacor.   H/O Malignant melanoma Followed by dermatology.   History of  COVID-19 History of covid.  No chest pain or chest tightness now.  Breathing stable.  Some fatigue.  Stay hydrated.  Improving.  Follow.    Meds ordered this encounter  Medications  . esomeprazole (NEXIUM) 40 MG capsule    Sig: Take 1 capsule (40 mg total) by mouth daily.    Dispense:  30 capsule    Refill:  2  . famotidine (PEPCID) 40 MG tablet    Sig: Take 1 tablet (40 mg total) by mouth daily.    Dispense:  30 tablet    Refill:  2     I discussed the assessment and treatment plan with the patient. The patient was provided an opportunity to ask questions and all were answered. The patient agreed with the plan and demonstrated an understanding of the instructions.   The patient was advised to call back or seek an in-person evaluation if the symptoms worsen or  if the condition fails to improve as anticipated.  I provided 25 minutes of non-face-to-face time during this encounter.   Einar Pheasant, MD

## 2019-11-12 ENCOUNTER — Encounter: Payer: Self-pay | Admitting: Internal Medicine

## 2019-11-12 NOTE — Assessment & Plan Note (Addendum)
Low cholesterol diet and exercise.  Follow lipid panel and liver function tests.  On mevacor.

## 2019-11-12 NOTE — Assessment & Plan Note (Signed)
S/p EGD - gastritis. On aciphex now.  Feels not working. Change to nexium.  Follow closely.  Get her back in soon to reassess.

## 2019-11-12 NOTE — Assessment & Plan Note (Signed)
On mevacor.  

## 2019-11-12 NOTE — Assessment & Plan Note (Signed)
Reflux as outlined.  Raw foods and tomatoes aaggravate.  dexilant not working.  Change to nexium.  Schedule f/u soon to reassess.

## 2019-11-12 NOTE — Assessment & Plan Note (Signed)
Overall handling things well.  Follow.   

## 2019-11-12 NOTE — Assessment & Plan Note (Signed)
Followed by dermatology

## 2019-11-13 DIAGNOSIS — Z8616 Personal history of COVID-19: Secondary | ICD-10-CM | POA: Insufficient documentation

## 2019-11-13 NOTE — Assessment & Plan Note (Signed)
History of covid.  No chest pain or chest tightness now.  Breathing stable.  Some fatigue.  Stay hydrated.  Improving.  Follow.

## 2019-11-23 ENCOUNTER — Ambulatory Visit: Payer: Medicare Other | Admitting: Urology

## 2019-11-28 NOTE — Progress Notes (Signed)
11/29/2019 9:49 AM   Rachael Austin May 18, 1941 [id number]  Referring provider: Einar Pheasant, [id number] [place],  [place] [id number]  Chief Complaint  Patient presents with  . Over Active Bladder    HPI: Rachael Austin is a 79 yo F with a hx of IC and OAB returns today for follow up.  She was last seen in December. She was put on a lower dose of imipramine and she does not feel like dosage is enough due to pain at night. She reports of having urgency when she needs to go to bathroom. She also notes of leaking before going to the bathroom.   She reports of irritation/dryness externally on vagina and has not used lubricant or Replens. She denies discharge.  She drinks a lot of water.   Her PVR is 68 mL today.    PMH: Past Medical History:  Diagnosis Date  . Breast screening, unspecified 2013  . Cancer ([place]) 1992   skin  . Degenerative disk disease   . GERD (gastroesophageal reflux disease)   . Hypercholesterolemia 2008  . Interstitial cystitis    followed by Dr Jacqlyn Larsen  . Other sign and symptom in breast 2013   Left upper outer quadrant breast "soreness" Ultrasound exam of right breast in the 2 o'clock position with the breast distracted medially showed a 0.3-0.4 with 0.5 cm simple cyst. In the 1 o'clock position where pt reported tenderness US exam was negatiive.  Because of her history of intermittent nipple drainage, ultrasound was completed of the retroareolar area.  . Personal history of tobacco use, presenting hazards to health   . PONV (postoperative nausea and vomiting)   . Special screening for malignant neoplasms, colon     Surgical History: Past Surgical History:  Procedure Laterality Date  . ABDOMINAL HYSTERECTOMY  1992  . APPENDECTOMY    . CERVICAL DISCECTOMY     S/P C7-T1 discectomy with fusion  . CHOLECYSTECTOMY  1990  . COLONOSCOPY WITH PROPOFOL N/A 02/14/2016   Procedure: COLONOSCOPY WITH PROPOFOL;  Surgeon: Lucilla Lame, MD;  Location: [facility];  Service: Endoscopy;  Laterality: N/A;  PT WOULD LIKE 10 ARRIVAL TIME OR LATER  . DILATION AND CURETTAGE OF UTERUS    . ESOPHAGOGASTRODUODENOSCOPY (EGD) WITH PROPOFOL N/A 02/14/2016   Procedure: ESOPHAGOGASTRODUODENOSCOPY (EGD) WITH PROPOFOL with dialtion;  Surgeon: Lucilla Lame, MD;  Location: [facility];  Service: Endoscopy;  Laterality: N/A;  . ESOPHAGOGASTRODUODENOSCOPY (EGD) WITH PROPOFOL N/A 04/13/2019   Procedure: ESOPHAGOGASTRODUODENOSCOPY (EGD) WITH PROPOFOL;  Surgeon: Toledo, Benay Pike, MD;  Location: [facility];  Service: Gastroenterology;  Laterality: N/A;  . MELANOMA EXCISION     removed from Left calf 1994    Home Medications:  Allergies as of 11/29/2019      Reactions   Augmentin [amoxicillin-pot Clavulanate] Swelling, Rash   Swelling of the lips   Codeine Sulfate Other (See Comments)   "Makes her hyper"   Vesicare [solifenacin Succinate] Nausea And Vomiting, Rash      Medication List       Accurate as of November 29, 2019 11:59 PM. If you have any questions, ask your nurse or doctor.        acetaminophen 325 MG tablet Commonly known as: TYLENOL Take 650 mg by mouth as needed.   esomeprazole 40 MG capsule Commonly known as: NexIUM Take 1 capsule (40 mg total) by mouth daily.   famotidine 40 MG tablet Commonly known as: Pepcid Take 1 tablet (40  mg total) by mouth daily.   ibuprofen 200 MG tablet Commonly known as: ADVIL Take 200 mg by mouth every 6 (six) hours as needed.   imipramine 10 MG tablet Commonly known as: TOFRANIL Take 1 tablet (10 mg total) by mouth at bedtime.   lovastatin 20 MG tablet Commonly known as: MEVACOR TAKE 1 TABLET BY MOUTH AT BEDTIME   Melatonin 10 MG Caps Take by mouth as needed.   MULTI-VITAMIN DAILY PO Take 1 tablet by mouth daily.   ondansetron 4 MG disintegrating tablet Commonly known as: ZOFRAN-ODT Take 1 tablet (4 mg total) by mouth every 8 (eight) hours as needed for  nausea or vomiting.   RABEprazole 20 MG tablet Commonly known as: ACIPHEX Take 20 mg by mouth daily.       Allergies:  Allergies  Allergen Reactions  . Augmentin [Amoxicillin-Pot Clavulanate] Swelling and Rash    Swelling of the lips  . Codeine Sulfate Other (See Comments)    "Makes her hyper"  . Vesicare [Solifenacin Succinate] Nausea And Vomiting and Rash    Family History: Family History  Problem Relation Age of Onset  . Hodgkin's lymphoma Mother   . Heart failure Father   . Heart attack Father   . Arthritis Sister        Three sisters w/ degeneratve disk disease  . Headache Sister        Two sisters hx of headache  . Breast cancer Neg Hx   . Colon cancer Neg Hx   . Bladder Cancer Neg Hx   . Kidney cancer Neg Hx     Social History:  reports that she has quit smoking. Her smoking use included cigarettes. She has a 15.00 pack-year smoking history. She has never used smokeless tobacco. She reports that she does not drink alcohol or use drugs.   Physical Exam: BP (!) 174/79   Pulse (!) 101   Ht 5\' 4"  (1.626 m)   Wt 150 lb (68 kg)   LMP 08/09/2012   BMI 25.75 kg/m   Constitutional:  Alert and oriented, No acute distress. HEENT: [place] AT, moist mucus membranes.  Trachea midline, no masses. Cardiovascular: No clubbing, cyanosis, or edema. Respiratory: Normal respiratory effort, no increased work of breathing. Skin: No rashes, bruises or suspicious lesions. Neurologic: Grossly intact, no focal deficits, moving all 4 extremities. Psychiatric: Normal mood and affect.  Laboratory Data:  Urinalysis  Pertinent Imaging: Results for orders placed or performed in visit on 11/29/19  BLADDER SCAN AMB NON-IMAGING  Result Value Ref Range   Scan Result 68 ml     Assessment & Plan:    1. Interstitial cystitis  Continue imipramine 10 mg - previously did well with this but 25 mg caused incomplete bladder emtpying Symptoms primarily consistent with bladder overactivity   See below  2. OAB Pt notes of leakage before going to bathroom  Trial of Myrbetriq for 6 weeks, given samples today in combination with imipramine  F/u in 5 weeks for symptom recheck   3. History of Incomplete Bladder Emptying Recheck PVR today- Adequate emptying today   4. Vaginal dryness Recommended OTC trial Replens If it fails to improve, will consider estrogen cream    Return in about 5 weeks (around 01/03/2020) for PA for PVR, symptoms recheck.   [facility] [address] [place], [place] [address] [phone]  I, Lucas Mallow, am acting as a scribe for Dr. Hollice Espy,  I have reviewed the above documentation for  accuracy and completeness, and I agree with the above.   Hollice Espy, MD

## 2019-11-29 ENCOUNTER — Ambulatory Visit: Payer: Medicare PPO | Admitting: Urology

## 2019-11-29 ENCOUNTER — Other Ambulatory Visit: Payer: Self-pay

## 2019-11-29 VITALS — BP 174/79 | HR 101 | Ht 64.0 in | Wt 150.0 lb

## 2019-11-29 DIAGNOSIS — N3281 Overactive bladder: Secondary | ICD-10-CM

## 2019-11-29 LAB — BLADDER SCAN AMB NON-IMAGING: Scan Result: 68

## 2019-11-29 NOTE — Patient Instructions (Addendum)
Recommend Relens vaginal moisturizer for vaginal dryness

## 2019-12-06 ENCOUNTER — Encounter: Payer: Self-pay | Admitting: Internal Medicine

## 2019-12-06 ENCOUNTER — Ambulatory Visit (INDEPENDENT_AMBULATORY_CARE_PROVIDER_SITE_OTHER): Payer: Medicare PPO | Admitting: Internal Medicine

## 2019-12-06 ENCOUNTER — Other Ambulatory Visit: Payer: Self-pay

## 2019-12-06 DIAGNOSIS — K219 Gastro-esophageal reflux disease without esophagitis: Secondary | ICD-10-CM

## 2019-12-06 DIAGNOSIS — R030 Elevated blood-pressure reading, without diagnosis of hypertension: Secondary | ICD-10-CM

## 2019-12-06 DIAGNOSIS — I700 Atherosclerosis of aorta: Secondary | ICD-10-CM | POA: Diagnosis not present

## 2019-12-06 DIAGNOSIS — E7800 Pure hypercholesterolemia, unspecified: Secondary | ICD-10-CM | POA: Diagnosis not present

## 2019-12-06 DIAGNOSIS — F439 Reaction to severe stress, unspecified: Secondary | ICD-10-CM | POA: Diagnosis not present

## 2019-12-06 DIAGNOSIS — N3010 Interstitial cystitis (chronic) without hematuria: Secondary | ICD-10-CM | POA: Diagnosis not present

## 2019-12-06 MED ORDER — AMLODIPINE BESYLATE 2.5 MG PO TABS: 2.5000 mg | ORAL_TABLET | Freq: Every day | ORAL | 1 refills | Status: DC

## 2019-12-06 NOTE — Progress Notes (Signed)
[name], female   DOB: 07/09/1941, 79 y.o.   MRN: [id number]   Subjective:    [name], female    DOB: 03-04-1941, 79 y.o.   MRN: [id number]  HPI  Patient here for a scheduled follow up. She is using nexium in am and pepcid in the evening.  Acid improved.  myrbetriq helping. Followed by urology.   Still some hoarseness.  No chest pain or chest congestion.  no sob reported.  Concern regarding right neck - tender.  Question of fullness.  Discussed ENT evaluation.  No increased abdominal pain.  Bowels moving.  Handling stress.     Past Medical History:  Diagnosis Date  . Breast screening, unspecified 2013  . Cancer ([place]) 1992   skin  . Degenerative disk disease   . GERD (gastroesophageal reflux disease)   . Hypercholesterolemia 2008  . Interstitial cystitis    followed by Dr Jacqlyn Larsen  . Other sign and symptom in breast 2013   Left upper outer quadrant breast "soreness" Ultrasound exam of right breast in the 2 o'clock position with the breast distracted medially showed a 0.3-0.4 with 0.5 cm simple cyst. In the 1 o'clock position where pt reported tenderness US exam was negatiive.  Because of her history of intermittent nipple drainage, ultrasound was completed of the retroareolar area.  . Personal history of tobacco use, presenting hazards to health   . PONV (postoperative nausea and vomiting)   . Special screening for malignant neoplasms, colon    Past Surgical History:  Procedure Laterality Date  . ABDOMINAL HYSTERECTOMY  1992  . APPENDECTOMY    . CERVICAL DISCECTOMY     S/P C7-T1 discectomy with fusion  . CHOLECYSTECTOMY  1990  . COLONOSCOPY WITH PROPOFOL N/A 02/14/2016   Procedure: COLONOSCOPY WITH PROPOFOL;  Surgeon: Lucilla Lame, MD;  Location: [facility];  Service: Endoscopy;  Laterality: N/A;  PT WOULD LIKE 10 ARRIVAL TIME OR LATER  . DILATION AND CURETTAGE OF UTERUS    . ESOPHAGOGASTRODUODENOSCOPY (EGD) WITH PROPOFOL N/A 02/14/2016    Procedure: ESOPHAGOGASTRODUODENOSCOPY (EGD) WITH PROPOFOL with dialtion;  Surgeon: Lucilla Lame, MD;  Location: [facility];  Service: Endoscopy;  Laterality: N/A;  . ESOPHAGOGASTRODUODENOSCOPY (EGD) WITH PROPOFOL N/A 04/13/2019   Procedure: ESOPHAGOGASTRODUODENOSCOPY (EGD) WITH PROPOFOL;  Surgeon: Toledo, Benay Pike, MD;  Location: [facility];  Service: Gastroenterology;  Laterality: N/A;  . MELANOMA EXCISION     removed from Left calf 1994   Family History  Problem Relation Age of Onset  . Hodgkin's lymphoma Mother   . Heart failure Father   . Heart attack Father   . Arthritis Sister        Three sisters w/ degeneratve disk disease  . Headache Sister        Two sisters hx of headache  . Breast cancer Neg Hx   . Colon cancer Neg Hx   . Bladder Cancer Neg Hx   . Kidney cancer Neg Hx    Social History   Socioeconomic History  . Marital status: Widowed    Spouse name: Not on file  . Number of children: 2  . Years of education: Not on file  . Highest education level: Not on file  Occupational History  . Not on file  Tobacco Use  . Smoking status: Former Smoker    Packs/day: 1.00    Years: 15.00    Pack years: 15.00    Types: Cigarettes  . Smokeless tobacco: Never Used  Substance and Sexual Activity  . Alcohol use: No    Alcohol/week: 0.0 standard drinks  . Drug use: No  . Sexual activity: Not on file  Other Topics Concern  . Not on file  Social History Narrative   Married and has 2 children, daughters.   Social Determinants of Health   Financial Resource Strain:   . Difficulty of Paying Living Expenses:   Food Insecurity:   . Worried About Charity fundraiser in the Last Year:   . Arboriculturist in the Last Year:   Transportation Needs:   . Film/video editor (Medical):   Marland Kitchen Lack of Transportation (Non-Medical):   Physical Activity:   . Days of Exercise per Week:   . Minutes of Exercise per Session:   Stress:   . Feeling of Stress :   Social  Connections:   . Frequency of Communication with Friends and Family:   . Frequency of Social Gatherings with Friends and Family:   . Attends Religious Services:   . Active Member of Clubs or Organizations:   . Attends Archivist Meetings:   Marland Kitchen Marital Status:     Outpatient Encounter Medications as of 12/06/2019  Medication Sig  . acetaminophen (TYLENOL) 325 MG tablet Take 650 mg by mouth as needed.  Marland Kitchen esomeprazole (NEXIUM) 40 MG capsule Take 1 capsule (40 mg total) by mouth daily.  . famotidine (PEPCID) 40 MG tablet Take 1 tablet (40 mg total) by mouth daily.  Marland Kitchen ibuprofen (ADVIL,MOTRIN) 200 MG tablet Take 200 mg by mouth every 6 (six) hours as needed.  Marland Kitchen imipramine (TOFRANIL) 10 MG tablet Take 1 tablet (10 mg total) by mouth at bedtime.  . lovastatin (MEVACOR) 20 MG tablet TAKE 1 TABLET BY MOUTH AT BEDTIME  . Melatonin 10 MG CAPS Take by mouth as needed.   . Multiple Vitamin (MULTI-VITAMIN DAILY PO) Take 1 tablet by mouth daily.  . ondansetron (ZOFRAN-ODT) 4 MG disintegrating tablet Take 1 tablet (4 mg total) by mouth every 8 (eight) hours as needed for nausea or vomiting.  . RABEprazole (ACIPHEX) 20 MG tablet Take 20 mg by mouth daily.  Marland Kitchen amLODipine (NORVASC) 2.5 MG tablet Take 1 tablet (2.5 mg total) by mouth daily.   No facility-administered encounter medications on file as of 12/06/2019.    Review of Systems  Constitutional: Negative for appetite change and unexpected weight change.  HENT: Negative for congestion and sinus pressure.   Respiratory: Negative for cough, chest tightness and shortness of breath.   Cardiovascular: Negative for chest pain, palpitations and leg swelling.  Gastrointestinal: Negative for abdominal pain, diarrhea, nausea and vomiting.       Acid reflux better.    Genitourinary: Negative for difficulty urinating and dysuria.       Myrbetriq helping.    Musculoskeletal: Negative for joint swelling and myalgias.  Skin: Negative for color change and  rash.  Neurological: Negative for dizziness, light-headedness and headaches.  Psychiatric/Behavioral: Negative for agitation and dysphoric mood.       Objective:    Physical Exam Constitutional:      General: She is not in acute distress.    Appearance: Normal appearance.  HENT:     Head: Normocephalic and atraumatic.     Right Ear: External ear normal.     Left Ear: External ear normal.  Eyes:     General: No scleral icterus.       Right eye: No discharge.  Left eye: No discharge.     Conjunctiva/sclera: Conjunctivae normal.  Neck:     Thyroid: No thyromegaly.  Cardiovascular:     Rate and Rhythm: Normal rate and regular rhythm.  Pulmonary:     Effort: No respiratory distress.     Breath sounds: Normal breath sounds. No wheezing.  Abdominal:     General: Bowel sounds are normal.     Palpations: Abdomen is soft.     Tenderness: There is no abdominal tenderness.  Musculoskeletal:        General: No swelling or tenderness.     Cervical back: Neck supple. No tenderness.  Lymphadenopathy:     Cervical: No cervical adenopathy.  Skin:    Findings: No erythema or rash.  Neurological:     Mental Status: She is alert.  Psychiatric:        Mood and Affect: Mood normal.        Behavior: Behavior normal.     BP (!) 160/70   Pulse 84   Temp (!) 96.9 F (36.1 C)   Resp 16   Wt 149 lb 12.8 oz (67.9 kg)   LMP 08/09/2012   SpO2 99%   BMI 25.71 kg/m  Wt Readings from Last 3 Encounters:  12/06/19 149 lb 12.8 oz (67.9 kg)  11/29/19 150 lb (68 kg)  11/07/19 153 lb (69.4 kg)     Lab Results  Component Value Date   WBC 4.3 09/03/2019   HGB 12.9 09/03/2019   HCT 37.8 09/03/2019   PLT 172 09/03/2019   GLUCOSE 108 (H) 09/03/2019   CHOL 181 06/07/2019   TRIG 75.0 06/07/2019   HDL 75.00 06/07/2019   LDLCALC 91 06/07/2019   ALT 10 07/25/2019   AST 17 07/25/2019   NA 133 (L) 09/03/2019   K 4.4 09/03/2019   CL 101 09/03/2019   CREATININE 0.79 09/03/2019   BUN 7  (L) 09/03/2019   CO2 24 09/03/2019   TSH 3.13 06/07/2019   HGBA1C 5.8 06/07/2019    DG Chest 2 View  Result Date: 09/03/2019 CLINICAL DATA:  Chest pressure EXAM: CHEST - 2 VIEW COMPARISON:  Chest radiograph dated O 10/01/2017. FINDINGS: The heart size and mediastinal contours are within normal limits. Both lungs are clear. Fixation hardware overlies the lower cervical spine. IMPRESSION: No active cardiopulmonary disease. Electronically Signed   By: Zerita Boers M.D.   On: 09/03/2019 12:11       Assessment & Plan:   Problem List Items Addressed This Visit    Aortic atherosclerosis ([place])    On mevacor.       Relevant Medications   amLODipine (NORVASC) 2.5 MG tablet   Chronic interstitial cystitis    Being followed by urology.  Urinary symptoms have improved with myrbetriq.        Elevated blood pressure reading    Blood pressure remaining elevated.  Start amlodipine 2.5mg  q day.  Follow pressures.  Get her back in soon to reassess.  Check metabolic panel.        GERD (gastroesophageal reflux disease)    Doing better with nexium in am and pepcid in the evening.  Follow.        Hypercholesterolemia    On mevacor.  Low cholesterol diet and exercise.  Follow lipid panel and liver function tests.        Relevant Medications   amLODipine (NORVASC) 2.5 MG tablet   Other Relevant Orders   Hepatic function panel   Lipid panel  Basic metabolic panel   Stress    Increased stress.  Overall handling things relatively well.  Has good support.  Follow.           Einar Pheasant, MD

## 2019-12-11 ENCOUNTER — Encounter: Payer: Self-pay | Admitting: Internal Medicine

## 2019-12-11 NOTE — Assessment & Plan Note (Signed)
Doing better with nexium in am and pepcid in the evening.  Follow.

## 2019-12-11 NOTE — Assessment & Plan Note (Signed)
On mevacor.  

## 2019-12-11 NOTE — Assessment & Plan Note (Signed)
Blood pressure remaining elevated.  Start amlodipine 2.5mg  q day.  Follow pressures.  Get her back in soon to reassess.  Check metabolic panel.

## 2019-12-11 NOTE — Assessment & Plan Note (Signed)
Being followed by urology.  Urinary symptoms have improved with myrbetriq.

## 2019-12-11 NOTE — Assessment & Plan Note (Signed)
On mevacor.  Low cholesterol diet and exercise.  Follow lipid panel and liver function tests.  

## 2019-12-11 NOTE — Assessment & Plan Note (Signed)
Increased stress.  Overall handling things relatively well.  Has good support.  Follow.

## 2019-12-26 ENCOUNTER — Telehealth: Payer: Self-pay | Admitting: Internal Medicine

## 2019-12-26 MED ORDER — LOVASTATIN 20 MG PO TABS: 20.0000 mg | ORAL_TABLET | Freq: Every day | ORAL | 1 refills | Status: DC

## 2019-12-26 NOTE — Telephone Encounter (Signed)
Pt needs a refill on lovastatin (MEVACOR) 20 MG tablet sent to [facility]

## 2020-01-03 ENCOUNTER — Ambulatory Visit (INDEPENDENT_AMBULATORY_CARE_PROVIDER_SITE_OTHER): Payer: Medicare PPO | Admitting: Physician Assistant

## 2020-01-03 ENCOUNTER — Encounter: Payer: Self-pay | Admitting: Physician Assistant

## 2020-01-03 ENCOUNTER — Other Ambulatory Visit: Payer: Self-pay

## 2020-01-03 VITALS — BP 159/79 | HR 85 | Ht 64.0 in | Wt 149.0 lb

## 2020-01-03 DIAGNOSIS — N3010 Interstitial cystitis (chronic) without hematuria: Secondary | ICD-10-CM

## 2020-01-03 DIAGNOSIS — N3281 Overactive bladder: Secondary | ICD-10-CM

## 2020-01-03 DIAGNOSIS — N898 Other specified noninflammatory disorders of vagina: Secondary | ICD-10-CM | POA: Diagnosis not present

## 2020-01-03 LAB — BLADDER SCAN AMB NON-IMAGING: Scan Result: 12

## 2020-01-03 NOTE — Patient Instructions (Signed)
1. Stop taking Myrbetriq for about 4 days and see if your itching resolves. Call our office later this week with your results.  2. Keep taking imiprimine for your IC. 3. Try Replens, an over-the-counter vaginal moisturizer.

## 2020-01-03 NOTE — Progress Notes (Addendum)
01/03/2020 1:29 PM   Rachael Austin 08/05/1941 [id number]  CC: Symptom recheck  HPI: Rachael Austin is a 79 y.o. female with PMH IC, OAB, and atrophic vaginitis who presents today for symptom recheck on Myrbetriq.  She was seen by Dr. Erlene Quan on 11/29/2019 for follow-up of the above.  She continues to take imipramine 10 mg for management of her IC.  Today, she reports inadequate symptom control on this dose and reports constant bladder pain that is bothersome for her.  PVR 12 mL today.  She denies recent flares of her bladder pain but states her symptoms are worsened by acidic foods and chocolate.  She has been taking Myrbetriq daily since seeing Dr. Erlene Quan.  She states she also started a new antihypertensive medication at that time.  She states she has been having diffuse pruritus in the interim and is not sure which medication might be causing it.  She thinks the Myrbetriq has slightly improved her symptoms of urinary urgency and frequency, however she states she is most bothered by her bladder pain.  She has not yet tried Replens for management of vaginal dryness.  PMH: Past Medical History:  Diagnosis Date  . Breast screening, unspecified 2013  . Cancer ([place]) 1992   skin  . Degenerative disk disease   . GERD (gastroesophageal reflux disease)   . Hypercholesterolemia 2008  . Interstitial cystitis    followed by Dr Jacqlyn Larsen  . Other sign and symptom in breast 2013   Left upper outer quadrant breast "soreness" Ultrasound exam of right breast in the 2 o'clock position with the breast distracted medially showed a 0.3-0.4 with 0.5 cm simple cyst. In the 1 o'clock position where pt reported tenderness US exam was negatiive.  Because of her history of intermittent nipple drainage, ultrasound was completed of the retroareolar area.  . Personal history of tobacco use, presenting hazards to health   . PONV (postoperative nausea and vomiting)   . Special screening for malignant  neoplasms, colon     Surgical History: Past Surgical History:  Procedure Laterality Date  . ABDOMINAL HYSTERECTOMY  1992  . APPENDECTOMY    . CERVICAL DISCECTOMY     S/P C7-T1 discectomy with fusion  . CHOLECYSTECTOMY  1990  . COLONOSCOPY WITH PROPOFOL N/A 02/14/2016   Procedure: COLONOSCOPY WITH PROPOFOL;  Surgeon: Lucilla Lame, MD;  Location: [facility];  Service: Endoscopy;  Laterality: N/A;  PT WOULD LIKE 10 ARRIVAL TIME OR LATER  . DILATION AND CURETTAGE OF UTERUS    . ESOPHAGOGASTRODUODENOSCOPY (EGD) WITH PROPOFOL N/A 02/14/2016   Procedure: ESOPHAGOGASTRODUODENOSCOPY (EGD) WITH PROPOFOL with dialtion;  Surgeon: Lucilla Lame, MD;  Location: [facility];  Service: Endoscopy;  Laterality: N/A;  . ESOPHAGOGASTRODUODENOSCOPY (EGD) WITH PROPOFOL N/A 04/13/2019   Procedure: ESOPHAGOGASTRODUODENOSCOPY (EGD) WITH PROPOFOL;  Surgeon: Toledo, Benay Pike, MD;  Location: [facility] END[facility];  Service: Gastroenterology;  Laterality: N/A;  . MELANOMA EXCISION     removed from Left calf 1994    Home Medications:  Allergies as of 01/03/2020      Reactions   Augmentin [amoxicillin-pot Clavulanate] Swelling, Rash   Swelling of the lips   Codeine Sulfate Other (See Comments)   "Makes her hyper"   Vesicare [solifenacin Succinate] Nausea And Vomiting, Rash      Medication List       Accurate as of January 03, 2020  1:29 PM. If you have any questions, ask your nurse or doctor.        acetaminophen  325 MG tablet Commonly known as: TYLENOL Take 650 mg by mouth as needed.   amLODipine 2.5 MG tablet Commonly known as: NORVASC Take 1 tablet (2.5 mg total) by mouth daily.   esomeprazole 40 MG capsule Commonly known as: NexIUM Take 1 capsule (40 mg total) by mouth daily.   famotidine 40 MG tablet Commonly known as: Pepcid Take 1 tablet (40 mg total) by mouth daily.   ibuprofen 200 MG tablet Commonly known as: ADVIL Take 200 mg by mouth every 6 (six) hours as needed.   imipramine  10 MG tablet Commonly known as: TOFRANIL Take 1 tablet (10 mg total) by mouth at bedtime.   lovastatin 20 MG tablet Commonly known as: MEVACOR Take 1 tablet (20 mg total) by mouth at bedtime.   Melatonin 10 MG Caps Take by mouth as needed.   MULTI-VITAMIN DAILY PO Take 1 tablet by mouth daily.   ondansetron 4 MG disintegrating tablet Commonly known as: ZOFRAN-ODT Take 1 tablet (4 mg total) by mouth every 8 (eight) hours as needed for nausea or vomiting.   RABEprazole 20 MG tablet Commonly known as: ACIPHEX Take 20 mg by mouth daily.       Allergies:  Allergies  Allergen Reactions  . Augmentin [Amoxicillin-Pot Clavulanate] Swelling and Rash    Swelling of the lips  . Codeine Sulfate Other (See Comments)    "Makes her hyper"  . Vesicare [Solifenacin Succinate] Nausea And Vomiting and Rash    Family History: Family History  Problem Relation Age of Onset  . Hodgkin's lymphoma Mother   . Heart failure Father   . Heart attack Father   . Arthritis Sister        Three sisters w/ degeneratve disk disease  . Headache Sister        Two sisters hx of headache  . Breast cancer Neg Hx   . Colon cancer Neg Hx   . Bladder Cancer Neg Hx   . Kidney cancer Neg Hx     Social History:   reports that she has quit smoking. Her smoking use included cigarettes. She has a 15.00 pack-year smoking history. She has never used smokeless tobacco. She reports that she does not drink alcohol or use drugs.  Physical Exam: LMP 08/09/2012   Constitutional:  Alert and oriented, no acute distress, nontoxic appearing HEENT: [place], AT Cardiovascular: No clubbing, cyanosis, or edema Respiratory: Normal respiratory effort, no increased work of breathing Skin: No rashes, bruises or suspicious lesions Neurologic: Grossly intact, no focal deficits, moving all 4 extremities Psychiatric: Normal mood and affect  Laboratory Data: Results for orders placed or performed in visit on 01/03/20  Bladder Scan  (Post Void Residual) in office  Result Value Ref Range   Scan Result 12    Assessment & Plan:   1. OAB (overactive bladder) Slight improvement in urinary urgency and frequency after 4 weeks of Myrbetriq.  Counseled patient to stop Myrbetriq for approximately 4 days, see if her pruritus improves, and call our office with her results.  If pruritus resolves with cessation of medication, may consider a trial of trospium, however I am concerned about anticholinergic use in this patient given her age.  If pruritus does not improve, have counseled her that her antihypertensive medication is the likely culprit.  At that point, will recommend continuation of Myrbetriq with symptom recheck in 2 months.  She expressed understanding. - Bladder Scan (Post Void Residual) in office  2. Interstitial cystitis Continue imipramine.  Offered referral to  pelvic floor PT.  Patient would like to defer this at this time.  I do not believe she is a good candidate for rescue solutions given that she is not having an acute flare of her symptoms.  3. Vaginal dryness Recommended to start Replens.  Patient expressed understanding.   Return in about 2 months (around 03/04/2020) for Symptom recheck.  Debroah Loop, PA-C  [facility] [address] [place], [place] [address] [phone]

## 2020-01-04 ENCOUNTER — Ambulatory Visit
Admission: RE | Admit: 2020-01-04 | Discharge: 2020-01-04 | Disposition: A | Discharge status: Home / Self Care | Payer: Medicare PPO | Source: Ambulatory Visit | Attending: Internal Medicine | Admitting: Internal Medicine

## 2020-01-04 ENCOUNTER — Other Ambulatory Visit (INDEPENDENT_AMBULATORY_CARE_PROVIDER_SITE_OTHER): Payer: Medicare PPO

## 2020-01-04 DIAGNOSIS — E7800 Pure hypercholesterolemia, unspecified: Secondary | ICD-10-CM | POA: Diagnosis not present

## 2020-01-04 DIAGNOSIS — E2839 Other primary ovarian failure: Secondary | ICD-10-CM

## 2020-01-04 DIAGNOSIS — Z1231 Encounter for screening mammogram for malignant neoplasm of breast: Secondary | ICD-10-CM | POA: Insufficient documentation

## 2020-01-04 DIAGNOSIS — Z780 Asymptomatic menopausal state: Secondary | ICD-10-CM | POA: Diagnosis not present

## 2020-01-04 DIAGNOSIS — M810 Age-related osteoporosis without current pathological fracture: Secondary | ICD-10-CM | POA: Diagnosis not present

## 2020-01-04 LAB — BASIC METABOLIC PANEL
BUN: 8 mg/dL (ref 6–23)
CO2: 28 mEq/L (ref 19–32)
Calcium: 9.3 mg/dL (ref 8.4–10.5)
Chloride: 106 mEq/L (ref 96–112)
Creatinine, Ser: 0.95 mg/dL (ref 0.40–1.20)
GFR: 56.6 mL/min — ABNORMAL LOW (ref >60.00)
Glucose, Bld: 101 mg/dL — ABNORMAL HIGH (ref 70–99)
Potassium: 4.4 mEq/L (ref 3.5–5.1)
Sodium: 141 mEq/L (ref 135–145)

## 2020-01-04 LAB — HEPATIC FUNCTION PANEL
ALT: 10 U/L (ref 0–35)
AST: 15 U/L (ref 0–37)
Albumin: 4.3 g/dL (ref 3.5–5.2)
Alkaline Phosphatase: 68 U/L (ref 39–117)
Bilirubin, Direct: 0.2 mg/dL (ref 0.0–0.3)
Total Bilirubin: 0.7 mg/dL (ref 0.2–1.2)
Total Protein: 6.8 g/dL (ref 6.0–8.3)

## 2020-01-04 LAB — LIPID PANEL
Cholesterol: 184 mg/dL (ref 0–200)
HDL: 83 mg/dL (ref >39.00)
LDL Cholesterol: 90 mg/dL (ref 0–99)
NonHDL: 100.82
Total CHOL/HDL Ratio: 2
Triglycerides: 54 mg/dL (ref 0.0–149.0)
VLDL: 10.8 mg/dL (ref 0.0–40.0)

## 2020-01-06 ENCOUNTER — Encounter: Payer: Self-pay | Admitting: Dermatology

## 2020-01-06 ENCOUNTER — Ambulatory Visit: Payer: Medicare PPO | Admitting: Dermatology

## 2020-01-06 ENCOUNTER — Other Ambulatory Visit: Payer: Self-pay

## 2020-01-06 DIAGNOSIS — L570 Actinic keratosis: Secondary | ICD-10-CM | POA: Diagnosis not present

## 2020-01-06 DIAGNOSIS — C44729 Squamous cell carcinoma of skin of left lower limb, including hip: Secondary | ICD-10-CM

## 2020-01-06 DIAGNOSIS — Z85828 Personal history of other malignant neoplasm of skin: Secondary | ICD-10-CM | POA: Diagnosis not present

## 2020-01-06 DIAGNOSIS — D485 Neoplasm of uncertain behavior of skin: Secondary | ICD-10-CM

## 2020-01-06 DIAGNOSIS — L578 Other skin changes due to chronic exposure to nonionizing radiation: Secondary | ICD-10-CM | POA: Diagnosis not present

## 2020-01-06 DIAGNOSIS — D0472 Carcinoma in situ of skin of left lower limb, including hip: Secondary | ICD-10-CM

## 2020-01-06 DIAGNOSIS — Z85820 Personal history of malignant melanoma of skin: Secondary | ICD-10-CM

## 2020-01-06 DIAGNOSIS — C44719 Basal cell carcinoma of skin of left lower limb, including hip: Secondary | ICD-10-CM | POA: Diagnosis not present

## 2020-01-06 MED ORDER — MUPIROCIN 2 % EX OINT: TOPICAL_OINTMENT | CUTANEOUS | 1 refills | Status: DC

## 2020-01-06 NOTE — Patient Instructions (Signed)
Shave Excision Benign Lesion Wound Care Instructions  . Leave the original bandage on for 24 hours if possible.  If the bandage becomes soaked or soiled before that time, it is OK to remove it and examine the wound.  A small amount of post-operative bleeding is normal.  If excessive bleeding occurs, remove the bandage, place gauze over the site and apply continuous pressure (no peeking) over the area for 20-30 minutes.  If this does not stop the bleeding, try again for 40 minutes.  If this does not work, please call our clinic as soon as possible (even if after-hours).    . Twice a day, cleanse the wound with soap and water.  If a thick crust develops you may use a Q-tip dipped into dilute hydrogen peroxide (mix 1:1 with water) to dissolve it.  Hydrogen peroxide can slow the healing process, so use it only as needed.  After washing, apply Vaseline jelly or Polysporin ointment.  For best healing, the wound should be covered with a layer of ointment at all times.  This may mean re-applying the ointment several times a day.  For open wounds, continue until it has healed.    . If you have any swelling, keep the area elevated.  . Some redness, tenderness and white or yellow material in the wound is normal healing.  If the area becomes very sore and red, or develops a thick yellow-green material (pus), it may be infected; please notify us.    . Wound healing continues for up to one year following surgery.  It is not unusual to experience pain in the scar from time to time during the interval.  If the pain becomes severe or the scar thickens, you should notify the office.  A slight amount of redness in a scar is expected for the first six months.  After six months, the redness subsides and the scar will soften and fade.  The color difference becomes less noticeable with time.  If there are any problems, return for a post-op surgery check at your earliest convenience.  . Please call our office for any questions  or concerns.   Recommend taking Heliocare sun protection supplement daily in sunny weather for additional sun protection. For maximum protection on the sunniest days, you can take up to 2 capsules of regular Heliocare OR take 1 capsule of Heliocare Ultra. For prolonged exposure (such as a full day in the sun), you can repeat your dose of the supplement 4 hours after your first dose. Heliocare can be purchased at [facility] or at [email or web address].   Recommend Nicotinamide 500mg  twice per day to lower risk of non-melanoma skin cancer by approximately 25%.

## 2020-01-06 NOTE — Progress Notes (Signed)
Follow-Up Visit   Subjective  Rachael Austin is a 79 y.o. female who presents for the following: New skin lesion (on the post calf, area is tender and irritated. It has been there for about 3 weeks now.).   The following portions of the chart were reviewed this encounter and updated as appropriate: Tobacco  Allergies  Meds  Problems  Med Hx  Surg Hx  Fam Hx     Review of Systems: No other skin or systemic complaints.  Objective  Well appearing patient in no apparent distress; mood and affect are within normal limits.  A focused examination was performed including the bilateral legs.  Relevant physical exam findings are noted in the Assessment and Plan.  Objective  B/L leg: Erythematous thin papules/macules with gritty scale.   Objective  B/L leg: Diffuse scaly erythematous macules with underlying dyspigmentation.   Objective  L calf post sup: 0.8 cm scaly pink papule      Objective  L calf post inf: 0.6 cm scaly pink papule     Objective  L shin: 1.0 cm pink plaque     Objective  L med ankle: 0.7 cm scaly pink papule with pigment globules.      Assessment & Plan  AK (actinic keratosis) B/L leg  Plan Ln2 at f/u appointment (patient going on vacation next week and doesn't want a lot done today).   Actinic skin damage B/L leg  Recommend daily broad spectrum sunscreen SPF 30+ to sun-exposed areas, reapply every 2 hours as needed. Call for new or changing lesions.   Neoplasm of uncertain behavior of skin (4) L calf post sup  Skin / nail biopsy Type of biopsy: tangential   Informed consent: discussed and consent obtained   Timeout: patient name, date of birth, surgical site, and procedure verified   Procedure prep:  Patient was prepped and draped in usual sterile fashion Prep type:  Chlorhexidine Anesthesia: the lesion was anesthetized in a standard fashion   Anesthetic:  1% lidocaine w/ epinephrine 1-100,000 buffered w/ 8.4%  NaHCO3 Instrument used: flexible razor blade   Hemostasis achieved with: pressure and aluminum chloride   Outcome: patient tolerated procedure well   Post-procedure details: sterile dressing applied and wound care instructions given   Dressing type: bandage and petrolatum    mupirocin ointment (BACTROBAN) 2 %  Specimen 1 - Surgical pathology Differential Diagnosis: D48.5 r/o SCC vs BCC vs ISK  Check Margins: No 0.8 cm scaly pink papule  L calf post inf  Skin / nail biopsy Type of biopsy: tangential   Informed consent: discussed and consent obtained   Timeout: patient name, date of birth, surgical site, and procedure verified   Procedure prep:  Patient was prepped and draped in usual sterile fashion Prep type:  Chlorhexidine Anesthesia: the lesion was anesthetized in a standard fashion   Anesthetic:  1% lidocaine w/ epinephrine 1-100,000 buffered w/ 8.4% NaHCO3 Instrument used: flexible razor blade   Hemostasis achieved with: pressure, aluminum chloride and electrodesiccation   Outcome: patient tolerated procedure well   Post-procedure details: sterile dressing applied and wound care instructions given   Dressing type: bandage and petrolatum    Specimen 2 - Surgical pathology Differential Diagnosis: D48.5 r/o SCC vs BCC vs ISK  Check Margins: No 0.6 cm scaly pink papule  L shin  Skin / nail biopsy Type of biopsy: tangential   Informed consent: discussed and consent obtained   Timeout: patient name, date of birth, surgical site, and procedure  verified   Procedure prep:  Patient was prepped and draped in usual sterile fashion Prep type:  Chlorhexidine Anesthesia: the lesion was anesthetized in a standard fashion   Anesthetic:  1% lidocaine w/ epinephrine 1-100,000 buffered w/ 8.4% NaHCO3 Instrument used: flexible razor blade   Hemostasis achieved with: pressure, aluminum chloride and electrodesiccation   Outcome: patient tolerated procedure well   Post-procedure details:  sterile dressing applied and wound care instructions given   Dressing type: bandage and petrolatum    Specimen 3 - Surgical pathology Differential Diagnosis: D48.5 r/o BCC  Check Margins: No 1.0 cm pink plaque  L med ankle  Skin / nail biopsy Type of biopsy: tangential   Informed consent: discussed and consent obtained   Timeout: patient name, date of birth, surgical site, and procedure verified   Procedure prep:  Patient was prepped and draped in usual sterile fashion Prep type:  Chlorhexidine Anesthesia: the lesion was anesthetized in a standard fashion   Anesthetic:  1% lidocaine w/ epinephrine 1-100,000 buffered w/ 8.4% NaHCO3 Instrument used: flexible razor blade   Hemostasis achieved with: pressure, aluminum chloride and electrodesiccation   Outcome: patient tolerated procedure well   Post-procedure details: sterile dressing applied and wound care instructions given   Dressing type: bandage and petrolatum    Specimen 4 - Surgical pathology Differential Diagnosis: D48.5 r/o pigmented BCC vs MM  Check Margins: No 0.7 cm scaly pink papule with pigment globules.  Start Mupirocin 2% ointment to aa's QD until healed.  Actinic Damage - Extensive - iffuse scaly erythematous macules with underlying dyspigmentation - Recommend daily broad spectrum sunscreen SPF 30+ to sun-exposed areas, reapply every 2 hours as needed.  - Call for new or changing lesions.  Actinic Keratoses, hypertrophic -Many hypertrophic actinic keratoses are noted on exam today. Treatment is deferred due to a planned beach trip this weekend. Will recheck and treat at follow-up.   History of Melanoma, SCC and BCC - Patient has not had a full body skin exam recently  - We will plan a full body skin exam at follow-up  - Will request skin cancers be abstracted into her epic chart - Recommend daily broad spectrum sunscreen SPF 30+ to sun-exposed areas, reapply every 2 hours as needed.  - Call if any new or  changing lesions are noted between office visits - Recommend starting Nicotinamide 500 mg twice a day to lower risk of developing more non-melanoma skin cancers about approximately 25%   Return in 4 weeks (on 02/03/2020) for TBSE.   Luther Redo, CMA, am acting as scribe for Forest Gleason, MD .  Documentation: I have reviewed the above documentation for accuracy and completeness, and I agree with the above.  Forest Gleason, MD

## 2020-01-09 ENCOUNTER — Encounter: Payer: Self-pay | Admitting: Dermatology

## 2020-01-10 ENCOUNTER — Other Ambulatory Visit: Payer: Self-pay

## 2020-01-10 ENCOUNTER — Ambulatory Visit: Payer: Medicare PPO | Admitting: Internal Medicine

## 2020-01-10 DIAGNOSIS — I10 Essential (primary) hypertension: Secondary | ICD-10-CM | POA: Diagnosis not present

## 2020-01-10 DIAGNOSIS — I700 Atherosclerosis of aorta: Secondary | ICD-10-CM

## 2020-01-10 DIAGNOSIS — K219 Gastro-esophageal reflux disease without esophagitis: Secondary | ICD-10-CM | POA: Diagnosis not present

## 2020-01-10 DIAGNOSIS — E7800 Pure hypercholesterolemia, unspecified: Secondary | ICD-10-CM

## 2020-01-10 DIAGNOSIS — N3010 Interstitial cystitis (chronic) without hematuria: Secondary | ICD-10-CM

## 2020-01-10 DIAGNOSIS — F439 Reaction to severe stress, unspecified: Secondary | ICD-10-CM

## 2020-01-10 DIAGNOSIS — Z85820 Personal history of malignant melanoma of skin: Secondary | ICD-10-CM

## 2020-01-10 NOTE — Progress Notes (Signed)
The previous result note was entered in error. The patient's results are below, and I will call the patient first thing in the morning to review the results and recommended treatment.  1. Skin , left calf post sup SQUAMOUS CELL CARCINOMA, KERATOACANTHOMA TYPE --> Will discuss excision vs ED&C in clinic.  2. Skin , left calf post inf, shave biopsy SQUAMOUS CELL CARCINOMA IN SITU ARISING IN A SEBORRHEIC KERATOSIS, BASE INVOLVED --> recommend ED&C in clinic  3. Skin , left shin BASAL CELL CARCINOMA, SUPERFICIAL AND NODULAR PATTERNS --> recommend ED&C in clinic but will discuss Mohs as well  4. Skin , left med ankle BASAL CELL CARCINOMA, NODULAR PATTERN --> recommend ED&C in clinic but will discuss Mohs as well

## 2020-01-10 NOTE — Progress Notes (Addendum)
The previous result note was entered in error. The patient's results are below, and I will call the patient first thing in the morning to review the results and recommended treatment.  1. Skin , left calf post sup SQUAMOUS CELL CARCINOMA, KERATOACANTHOMA TYPE --> Will discuss excision vs ED&C in clinic.  2. Skin , left calf post inf, shave biopsy SQUAMOUS CELL CARCINOMA IN SITU ARISING IN A SEBORRHEIC KERATOSIS, BASE INVOLVED --> recommend ED&C in clinic  3. Skin , left shin BASAL CELL CARCINOMA, SUPERFICIAL AND NODULAR PATTERNS --> recommend ED&C in clinic but will discuss Mohs as well  4. Skin , left med ankle BASAL CELL CARCINOMA, NODULAR PATTERN --> recommend ED&C in clinic but will discuss Mohs as well

## 2020-01-10 NOTE — Progress Notes (Signed)
[name], female   DOB: 04/24/1941, 79 y.o.   MRN: [id number]   Subjective:    [name], female    DOB: 25-Jan-1941, 79 y.o.   MRN: [id number]  HPI This visit occurred during the SARS-CoV-2 public health emergency.  Safety protocols were in place, including screening questions prior to the visit, additional usage of staff PPE, and extensive cleaning of exam room while observing appropriate contact time as indicated for disinfecting solutions.  Patient here for a scheduled follow up.  She reports intolerance to amlodipine.  Was recently evaluated by urology.  Was started on myrbetriq.  Noticed itching.  Urology stopped myrbetriq.  Itching is better - resolved.  Still intolerance to amlodipine.  Aching.  Blood pressures on outside checks - averaging 130-140s/60-70s.  No chest pain.  Breathing stable.  No increased cough or congestion.  No acid reflux.  No abdominal pain or bowel change.    Past Medical History:  Diagnosis Date  . BCC (basal cell carcinoma of skin) 03/30/2007   R inf med pretibial - BCC  . BCC (basal cell carcinoma of skin) 10/12/2013   L nasal ala - BCC  . BCC (basal cell carcinoma of skin) 03/10/2018   L mid dorsum med forearm - superficial BCC   . Breast screening, unspecified 2013  . Cancer ([place]) 1992   skin  . Degenerative disk disease   . GERD (gastroesophageal reflux disease)   . Hypercholesterolemia 2008  . Interstitial cystitis    followed by Dr Jacqlyn Larsen  . Other sign and symptom in breast 2013   Left upper outer quadrant breast "soreness" Ultrasound exam of right breast in the 2 o'clock position with the breast distracted medially showed a 0.3-0.4 with 0.5 cm simple cyst. In the 1 o'clock position where pt reported tenderness US exam was negatiive.  Because of her history of intermittent nipple drainage, ultrasound was completed of the retroareolar area.  . Personal history of tobacco use, presenting hazards to health   . PONV  (postoperative nausea and vomiting)   . SCC (squamous cell carcinoma) 03/28/2008   R dorsum hand - SCC  . SCC (squamous cell carcinoma) 05/09/2009   R lat lower leg - SCCIS  . SCC (squamous cell carcinoma) 05/09/2009   L lat lower leg - SCCIS  . SCC (squamous cell carcinoma) 04/07/2013   L lower leg - SCCIS  . SCC (squamous cell carcinoma) 04/27/2013   R forearm - SCC  . SCC (squamous cell carcinoma) 04/28/2017   L lat knee - SCCIS  . SCC (squamous cell carcinoma) 05/12/2018   L prox dorsum forearm - SCC  . SCC (squamous cell carcinoma), leg, right 03/30/2007   R sup pretibial - SCCIS  . SCC (squamous cell carcinoma);BCC 10/12/2013   Mid back - superficial BCC with SCCIS   . Special screening for malignant neoplasms, colon    Past Surgical History:  Procedure Laterality Date  . ABDOMINAL HYSTERECTOMY  1992  . APPENDECTOMY    . CERVICAL DISCECTOMY     S/P C7-T1 discectomy with fusion  . CHOLECYSTECTOMY  1990  . COLONOSCOPY WITH PROPOFOL N/A 02/14/2016   Procedure: COLONOSCOPY WITH PROPOFOL;  Surgeon: Lucilla Lame, MD;  Location: [facility];  Service: Endoscopy;  Laterality: N/A;  PT WOULD LIKE 10 ARRIVAL TIME OR LATER  . DILATION AND CURETTAGE OF UTERUS    . ESOPHAGOGASTRODUODENOSCOPY (EGD) WITH PROPOFOL N/A 02/14/2016   Procedure: ESOPHAGOGASTRODUODENOSCOPY (EGD) WITH PROPOFOL with dialtion;  Surgeon: Lucilla Lame, MD;  Location: [facility];  Service: Endoscopy;  Laterality: N/A;  . ESOPHAGOGASTRODUODENOSCOPY (EGD) WITH PROPOFOL N/A 04/13/2019   Procedure: ESOPHAGOGASTRODUODENOSCOPY (EGD) WITH PROPOFOL;  Surgeon: Toledo, Benay Pike, MD;  Location: [facility];  Service: Gastroenterology;  Laterality: N/A;  . MELANOMA EXCISION     removed from Left calf 1994   Family History  Problem Relation Age of Onset  . Hodgkin's lymphoma Mother   . Heart failure Father   . Heart attack Father   . Arthritis Sister        Three sisters w/ degeneratve disk disease  .  Headache Sister        Two sisters hx of headache  . Breast cancer Neg Hx   . Colon cancer Neg Hx   . Bladder Cancer Neg Hx   . Kidney cancer Neg Hx    Social History   Socioeconomic History  . Marital status: Widowed    Spouse name: Not on file  . Number of children: 2  . Years of education: Not on file  . Highest education level: Not on file  Occupational History  . Not on file  Tobacco Use  . Smoking status: Former Smoker    Packs/day: 1.00    Years: 15.00    Pack years: 15.00    Types: Cigarettes  . Smokeless tobacco: Never Used  Substance and Sexual Activity  . Alcohol use: No    Alcohol/week: 0.0 standard drinks  . Drug use: No  . Sexual activity: Not on file  Other Topics Concern  . Not on file  Social History Narrative   Married and has 2 children, daughters.   Social Determinants of Health   Financial Resource Strain:   . Difficulty of Paying Living Expenses:   Food Insecurity:   . Worried About Charity fundraiser in the Last Year:   . Arboriculturist in the Last Year:   Transportation Needs:   . Film/video editor (Medical):   Marland Kitchen Lack of Transportation (Non-Medical):   Physical Activity:   . Days of Exercise per Week:   . Minutes of Exercise per Session:   Stress:   . Feeling of Stress :   Social Connections:   . Frequency of Communication with Friends and Family:   . Frequency of Social Gatherings with Friends and Family:   . Attends Religious Services:   . Active Member of Clubs or Organizations:   . Attends Archivist Meetings:   Marland Kitchen Marital Status:     Outpatient Encounter Medications as of 01/10/2020  Medication Sig  . acetaminophen (TYLENOL) 325 MG tablet Take 650 mg by mouth as needed.  Marland Kitchen amLODipine (NORVASC) 2.5 MG tablet Take 1 tablet (2.5 mg total) by mouth daily.  Marland Kitchen esomeprazole (NEXIUM) 40 MG capsule Take 1 capsule (40 mg total) by mouth daily.  . famotidine (PEPCID) 40 MG tablet Take 1 tablet (40 mg total) by mouth  daily.  Marland Kitchen ibuprofen (ADVIL,MOTRIN) 200 MG tablet Take 200 mg by mouth every 6 (six) hours as needed.  Marland Kitchen imipramine (TOFRANIL) 10 MG tablet Take 1 tablet (10 mg total) by mouth at bedtime.  . lovastatin (MEVACOR) 20 MG tablet Take 1 tablet (20 mg total) by mouth at bedtime.  . Melatonin 10 MG CAPS Take by mouth as needed.   . Multiple Vitamin (MULTI-VITAMIN DAILY PO) Take 1 tablet by mouth daily.  . mupirocin ointment (BACTROBAN) 2 % Apply to aa's of biopsy sites  QD until healed.  . ondansetron (ZOFRAN-ODT) 4 MG disintegrating tablet Take 1 tablet (4 mg total) by mouth every 8 (eight) hours as needed for nausea or vomiting.  . [DISCONTINUED] RABEprazole (ACIPHEX) 20 MG tablet Take 20 mg by mouth daily.   No facility-administered encounter medications on file as of 01/10/2020.   Review of Systems  Constitutional: Negative for appetite change and unexpected weight change.  HENT: Negative for congestion and sinus pressure.   Respiratory: Negative for cough, chest tightness and shortness of breath.   Cardiovascular: Negative for chest pain, palpitations and leg swelling.  Gastrointestinal: Negative for abdominal pain, diarrhea and nausea.  Genitourinary: Negative for difficulty urinating and dysuria.  Musculoskeletal: Negative for joint swelling.       Aching.    Skin: Negative for color change and rash.       Itching has subsided.    Neurological: Negative for dizziness, light-headedness and headaches.  Psychiatric/Behavioral: Negative for agitation and dysphoric mood.       Objective:    Physical Exam Vitals reviewed.  Constitutional:      General: She is not in acute distress.    Appearance: Normal appearance.  HENT:     Head: Normocephalic and atraumatic.     Right Ear: External ear normal.     Left Ear: External ear normal.  Eyes:     General: No scleral icterus.       Right eye: No discharge.        Left eye: No discharge.  Neck:     Thyroid: No thyromegaly.    Cardiovascular:     Rate and Rhythm: Normal rate and regular rhythm.  Pulmonary:     Effort: No respiratory distress.     Breath sounds: Normal breath sounds. No wheezing.  Abdominal:     General: Bowel sounds are normal.     Palpations: Abdomen is soft.     Tenderness: There is no abdominal tenderness.  Musculoskeletal:        General: No swelling or tenderness.     Cervical back: Neck supple. No tenderness.  Lymphadenopathy:     Cervical: No cervical adenopathy.  Skin:    Findings: No erythema or rash.  Neurological:     Mental Status: She is alert.  Psychiatric:        Mood and Affect: Mood normal.        Behavior: Behavior normal.     BP 122/78   Pulse 82   Temp 97.6 F (36.4 C)   Resp 16   Ht 5\' 4"  (1.626 m)   Wt 149 lb 9.6 oz (67.9 kg)   LMP 08/09/2012   SpO2 98%   BMI 25.68 kg/m  Wt Readings from Last 3 Encounters:  01/10/20 149 lb 9.6 oz (67.9 kg)  01/03/20 149 lb (67.6 kg)  12/06/19 149 lb 12.8 oz (67.9 kg)     Lab Results  Component Value Date   WBC 4.3 09/03/2019   HGB 12.9 09/03/2019   HCT 37.8 09/03/2019   PLT 172 09/03/2019   GLUCOSE 101 (H) 01/04/2020   CHOL 184 01/04/2020   TRIG 54.0 01/04/2020   HDL 83.00 01/04/2020   LDLCALC 90 01/04/2020   ALT 10 01/04/2020   AST 15 01/04/2020   NA 141 01/04/2020   K 4.4 01/04/2020   CL 106 01/04/2020   CREATININE 0.95 01/04/2020   BUN 8 01/04/2020   CO2 28 01/04/2020   TSH 3.13 06/07/2019   HGBA1C 5.8 06/07/2019  DG Bone Density  Result Date: 01/04/2020 EXAM: DUAL X-RAY ABSORPTIOMETRY (DXA) FOR BONE MINERAL DENSITY IMPRESSION: Your patient Deeandra Heling completed a BMD test on 01/04/2020 using the [place] (software version: 14.10) manufactured by [employer]. The following summarizes the results of our evaluation. Technologist: MTB PATIENT BIOGRAPHICAL: Name: Tekoa, Marohl Patient ID: [id number] Birth Date: 11-29-1940 Height:     64.0 in. Gender: Female Exam  Date: 01/04/2020 Weight:     149.0 lbs. Indications: Caucasian, Hysterectomy, Oophorectomy Bilateral, Postmenopausal Fractures: Treatments: Multi-Vitamin, Nexium, Vitamin D DENSITOMETRY RESULTS: Site         Region     Measured Date Measured Age WHO Classification Young Adult T-score BMD         %Change vs. Previous Significant Change (*) DualFemur Neck Right 01/04/2020 78.9 Osteoporosis -2.7 0.669 g/cm2 - - DualFemur Total Mean 01/04/2020 79.9 Osteopenia -2.3 0.720 g/cm2 - - Left Forearm Radius 33% 01/04/2020 79.9 Osteoporosis -3.1 0.602 g/cm2 - - ASSESSMENT: The BMD measured at Forearm Radius 33% is 0.602 g/cm2 with a T-score of -3.1. This patient is considered OSTEOPOROTIC according to [facility]) criteria. The scan quality is good. Lumbar spine was not utilized due to advanced degenerative changes. World Pharmacologist [facility]) criteria for post-menopausal, Caucasian Women: Normal:                   T-score at or above -1 SD Osteopenia/low bone mass: T-score between -1 and -2.5 SD Osteoporosis:             T-score at or below -2.5 SD RECOMMENDATIONS: 1. All patients should optimize calcium and vitamin D intake. 2. Consider FDA-approved medical therapies in postmenopausal women and men aged 36 years and older, based on the following: a. A hip or vertebral(clinical or morphometric) fracture b. T-score < -2.5 at the femoral neck or spine after appropriate evaluation to exclude secondary causes c. Low bone mass (T-score between -1.0 and -2.5 at the femoral neck or spine) and a 10-year probability of a hip fracture > 3% or a 10-year probability of a major osteoporosis-related fracture > 20% based on the [place]-adapted WHO algorithm 3. Clinician judgment and/or patient preferences may indicate treatment for people with 10-year fracture probabilities above or below these levels FOLLOW-UP: People with diagnosed cases of osteoporosis or at high risk for fracture should have regular bone mineral density  tests. For patients eligible for Medicare, routine testing is allowed once every 2 years. The testing frequency can be increased to one year for patients who have rapidly progressing disease, those who are receiving or discontinuing medical therapy to restore bone mass, or have additional risk factors. I have reviewed this report, and agree with the above findings. Mark A. Thornton Papas, M.D. [facility] Electronically Signed   By: Lavonia Dana M.D.   On: 01/04/2020 12:22   MM 3D SCREEN BREAST BILATERAL  Result Date: 01/04/2020 CLINICAL DATA:  Screening. EXAM: DIGITAL SCREENING BILATERAL MAMMOGRAM WITH TOMO AND CAD COMPARISON:  Previous exam(s). ACR Breast Density Category b: There are scattered areas of fibroglandular density. FINDINGS: There are no findings suspicious for malignancy. Images were processed with CAD. IMPRESSION: No mammographic evidence of malignancy. A result letter of this screening mammogram will be mailed directly to the patient. RECOMMENDATION: Screening mammogram in one year. (Code:SM-B-01Y) BI-RADS CATEGORY  1: Negative. Electronically Signed   By: Lillia Mountain M.D.   On: 01/04/2020 12:38       Assessment & Plan:  Problem List Items Addressed This Visit    Acid reflux    S/p EGD - gastritis.  On nexium now.  No problems reported today. States doing ok on this regimen.  Follow.        Aortic atherosclerosis (HCC)    On mevacor.        Chronic interstitial cystitis    Followed by urology.  Symptoms stable.       Essential hypertension    Blood pressure as outlined. Intolerant to amlodipine.  Remain off amlodipine.  Follow pressures.  Follow metabolic panel.       H/O Malignant melanoma    Followed by dermatology.        Hypercholesterolemia    On mevacor.  Low cholesterol diet and exercise.  Follow lipid panel and liver function tests.        Stress    Overall handling things relatively well.  Follow.            Einar Pheasant, MD

## 2020-01-11 ENCOUNTER — Telehealth: Payer: Self-pay | Admitting: Dermatology

## 2020-01-11 NOTE — Telephone Encounter (Signed)
Called home and mobile, no answer.

## 2020-01-11 NOTE — Telephone Encounter (Signed)
Called patient to review pathology results. No answer. Left voicemail. It is her birthday today as well. Will try back again later. If she calls in and I am in seeing patients, ok to give her results.

## 2020-01-12 ENCOUNTER — Encounter: Payer: Self-pay | Admitting: Dermatology

## 2020-01-12 NOTE — Telephone Encounter (Signed)
Spoke with patient around 5:30 today and reviewed the results and treatment options. She is in agreement with doing [facility] for all sites. She was scheduled for next Friday. We will apply topical lidocaine tetracaine for 15 minutes before injecting lidocaine for improved comfort per her preference. We will plan a follow-up for her after that for her full body skin exam, to recheck her ED&C sites and to treat actinic keratoses identified at her last visit.  All questions answered.

## 2020-01-15 ENCOUNTER — Encounter: Payer: Self-pay | Admitting: Internal Medicine

## 2020-01-15 NOTE — Assessment & Plan Note (Signed)
On mevacor.  Low cholesterol diet and exercise.  Follow lipid panel and liver function tests.  

## 2020-01-15 NOTE — Assessment & Plan Note (Signed)
Overall handling things relatively well.  Follow.  

## 2020-01-15 NOTE — Assessment & Plan Note (Signed)
On mevacor.  

## 2020-01-15 NOTE — Assessment & Plan Note (Signed)
Followed by dermatology

## 2020-01-15 NOTE — Assessment & Plan Note (Signed)
S/p EGD - gastritis.  On nexium now.  No problems reported today. States doing ok on this regimen.  Follow.

## 2020-01-15 NOTE — Assessment & Plan Note (Signed)
Blood pressure as outlined. Intolerant to amlodipine.  Remain off amlodipine.  Follow pressures.  Follow metabolic panel.

## 2020-01-15 NOTE — Assessment & Plan Note (Signed)
Followed by urology.  Symptoms stable.

## 2020-01-17 ENCOUNTER — Encounter: Payer: Self-pay | Admitting: Physician Assistant

## 2020-01-17 ENCOUNTER — Telehealth: Payer: Self-pay | Admitting: *Deleted

## 2020-01-17 MED ORDER — TROSPIUM CHLORIDE 20 MG PO TABS: 20.0000 mg | ORAL_TABLET | Freq: Every day | ORAL | 2 refills | Status: DC

## 2020-01-17 NOTE — Addendum Note (Signed)
Addended by: Mickle Plumb on: 01/17/2020 01:31 PM   Modules accepted: Orders

## 2020-01-17 NOTE — Telephone Encounter (Signed)
Patient stop taking Myrbetriq for about 4 days and see if your itching resolves. Patient states the itching has resolves.

## 2020-01-17 NOTE — Telephone Encounter (Signed)
Attempted to contact patient, left detailed message notifying patient as advised.

## 2020-01-17 NOTE — Telephone Encounter (Signed)
Please contact the patient and thank her for following up as I requested.  I would like her to stop Myrbetriq at this time and have noted a Myrbetriq allergy in her chart.  I have sent an alternative medication, trospium, to the [facility] in [place]. I would like her to start taking this medication once daily. We will recheck her symptoms at her next scheduled appointment with me in June.

## 2020-01-19 ENCOUNTER — Telehealth: Payer: Self-pay | Admitting: Internal Medicine

## 2020-01-19 DIAGNOSIS — H938X9 Other specified disorders of ear, unspecified ear: Secondary | ICD-10-CM

## 2020-01-19 NOTE — Telephone Encounter (Signed)
Some drainage. No nasal congestion. Going to try nasacort nasal spray and call with update.

## 2020-01-19 NOTE — Telephone Encounter (Signed)
Patient is having pressure in her ears x 1 week. Feels like she is under water. No fever, no chills, no SOB, no other acute symptoms noted. Pt is wondering if there is anything OTC she can try. Every now and then she has a tickle in her throat and will cough some but not persistent. She has tried benadryl- no relief.

## 2020-01-19 NOTE — Telephone Encounter (Signed)
Any nasal congestion or drainage?  Can try nasacort nasal spray - 2 sprays each nostril one time per day.  If persistnet problems, needs evaluation.

## 2020-01-19 NOTE — Telephone Encounter (Signed)
Please call pt back on her cell phone @ [phone]

## 2020-01-19 NOTE — Telephone Encounter (Signed)
Pt is having ear pressure and feels like her head is under water when she talks or when someone is talking to her.  Pt wanted to know what she needed to do

## 2020-01-20 ENCOUNTER — Encounter: Payer: Self-pay | Admitting: Dermatology

## 2020-01-20 ENCOUNTER — Ambulatory Visit: Payer: Medicare PPO | Admitting: Dermatology

## 2020-01-20 ENCOUNTER — Other Ambulatory Visit: Payer: Self-pay

## 2020-01-20 DIAGNOSIS — L570 Actinic keratosis: Secondary | ICD-10-CM | POA: Diagnosis not present

## 2020-01-20 DIAGNOSIS — C44719 Basal cell carcinoma of skin of left lower limb, including hip: Secondary | ICD-10-CM | POA: Diagnosis not present

## 2020-01-20 DIAGNOSIS — C4492 Squamous cell carcinoma of skin, unspecified: Secondary | ICD-10-CM

## 2020-01-20 DIAGNOSIS — C44729 Squamous cell carcinoma of skin of left lower limb, including hip: Secondary | ICD-10-CM

## 2020-01-20 DIAGNOSIS — D0472 Carcinoma in situ of skin of left lower limb, including hip: Secondary | ICD-10-CM | POA: Diagnosis not present

## 2020-01-20 NOTE — Telephone Encounter (Signed)
Patient called to let Dr. Nicki Reaper know she feels better.

## 2020-01-20 NOTE — Patient Instructions (Addendum)
Electrodesiccation and Curettage ("Scrape and Burn") Wound Care Instructions  1. Leave the original bandage on for 24 hours if possible.  If the bandage becomes soaked or soiled before that time, it is OK to remove it and examine the wound.  A small amount of post-operative bleeding is normal.  If excessive bleeding occurs, remove the bandage, place gauze over the site and apply continuous pressure (no peeking) over the area for 30 minutes. If this does not work, please call our clinic as soon as possible or page your doctor if it is after hours.   2. Once a day, cleanse the wound with soap and water. It is fine to shower. If a thick crust develops you may use a Q-tip dipped into dilute hydrogen peroxide (mix 1:1 with water) to dissolve it.  Hydrogen peroxide can slow the healing process, so use it only as needed.    3. After washing, apply petroleum jelly (Vaseline) or an antibiotic ointment if your doctor prescribed one for you, followed by a bandage.    4. For best healing, the wound should be covered with a layer of ointment at all times. If you are not able to keep the area covered with a bandage to hold the ointment in place, this may mean re-applying the ointment several times a day.  Continue this wound care until the wound has healed and is no longer open. It may take several weeks for the wound to heal and close.  Itching and mild discomfort is normal during the healing process.  If you have any discomfort, you can take Tylenol (acetaminophen) or ibuprofen as directed on the bottle. (Please do not take these if you have an allergy to them or cannot take them for another reason).  Some redness, tenderness and white or yellow material in the wound is normal healing.  If the area becomes very sore and red, or develops a thick yellow-green material (pus), it may be infected; please notify us.    Wound healing continues for up to one year following surgery. It is not unusual to experience pain  in the scar from time to time during the interval.  If the pain becomes severe or the scar thickens, you should notify the office.    A slight amount of redness in a scar is expected for the first six months.  After six months, the redness will fade and the scar will soften and fade.  The color difference becomes less noticeable with time.  If there are any problems, return for a post-op surgery check at your earliest convenience.  To improve the appearance of the scar, you can use silicone scar gel, cream, or sheets (such as Mederma or Serica) every night for up to one year. These are available over the counter (without a prescription).  Please call our office at [phone] for any questions or concerns.   Cryotherapy Aftercare  . Wash gently with soap and water everyday.   Marland Kitchen Apply Vaseline and Band-Aid daily until healed.

## 2020-01-20 NOTE — Telephone Encounter (Signed)
Noted  

## 2020-01-20 NOTE — Progress Notes (Signed)
Follow-Up Visit   Subjective  Rachael Austin is a 79 y.o. female who presents for the following: Skin Cancer (here for treatment) and Actinic Keratosis (here for treatment).  The following portions of the chart were reviewed this encounter and updated as appropriate:  Tobacco  Allergies  Meds  Problems  Med Hx  Surg Hx  Fam Hx      Review of Systems:  No other skin or systemic complaints except as noted in HPI or Assessment and Plan.  Objective  Well appearing patient in no apparent distress; mood and affect are within normal limits.  A focused examination was performed including bilateral lower legs. Relevant physical exam findings are noted in the Assessment and Plan.  Objective  Left Knee - Anterior, Right Knee - Anterior: Erythematous scaly pink plaques  Objective  Left Shin: Pink papule  Objective  Left med ankle: Pink papule  Objective  left calf post inf: Scaly pink plaque  Objective  left calf post sup: Keratotic pink plaque.    Assessment & Plan  AK (actinic keratosis) (2) Left Knee - Anterior; Right Knee - Anterior  Cryotherapy today, may consider biopsy at follow-up if not resolved  Prior to procedure, discussed risks of blister formation, small wound, skin dyspigmentation, or rare scar following cryotherapy.     Destruction of lesion - Left Knee - Anterior, Right Knee - Anterior  Destruction method: cryotherapy   Informed consent: discussed and consent obtained   Lesion destroyed using liquid nitrogen: Yes   Cryotherapy cycles:  2 Outcome: patient tolerated procedure well with no complications   Post-procedure details: wound care instructions given    Basal cell carcinoma (BCC) of skin of left lower extremity including hip (2) Left Shin  Destruction of lesion  Destruction method: electrodesiccation and curettage   Informed consent: discussed and consent obtained   Timeout:  patient name, date of birth, surgical site, and  procedure verified Anesthesia: the lesion was anesthetized in a standard fashion   Anesthetic:  1% lidocaine w/ epinephrine 1-100,000 buffered w/ 8.4% NaHCO3 Curettage performed in three different directions: Yes   Electrodesiccation performed over the curetted area: Yes   Curettage cycles:  3 Lesion length (cm):  0.8 Lesion width (cm):  0.8 Margin per side (cm):  0.2 Final wound size (cm):  1.2 Hemostasis achieved with:  electrodesiccation Outcome: patient tolerated procedure well with no complications   Post-procedure details: sterile dressing applied and wound care instructions given   Dressing type: petrolatum    Left med ankle  Destruction of lesion  Destruction method: electrodesiccation and curettage   Informed consent: discussed and consent obtained   Timeout:  patient name, date of birth, surgical site, and procedure verified Anesthesia: the lesion was anesthetized in a standard fashion   Anesthetic:  1% lidocaine w/ epinephrine 1-100,000 buffered w/ 8.4% NaHCO3 Curettage performed in three different directions: Yes   Electrodesiccation performed over the curetted area: Yes   Curettage cycles:  3 Lesion length (cm):  0.7 Lesion width (cm):  0.7 Margin per side (cm):  0.2 Final wound size (cm):  1.1 Hemostasis achieved with:  electrodesiccation Outcome: patient tolerated procedure well with no complications   Post-procedure details: sterile dressing applied and wound care instructions given   Dressing type: petrolatum    Carcinoma in situ of skin of left lower extremity including hip left calf post inf  Destruction of lesion  Destruction method: electrodesiccation and curettage   Informed consent: discussed and consent obtained  Timeout:  patient name, date of birth, surgical site, and procedure verified Anesthesia: the lesion was anesthetized in a standard fashion   Anesthetic:  1% lidocaine w/ epinephrine 1-100,000 buffered w/ 8.4% NaHCO3 Curettage performed  in three different directions: Yes   Electrodesiccation performed over the curetted area: Yes   Curettage cycles:  3 Lesion length (cm):  1 Lesion width (cm):  1 Margin per side (cm):  0.2 Final wound size (cm):  1.4 Hemostasis achieved with:  electrodesiccation Outcome: patient tolerated procedure well with no complications   Post-procedure details: sterile dressing applied and wound care instructions given   Dressing type: petrolatum    Squamous cell carcinoma of skin left calf post sup  Destruction of lesion  Destruction method: electrodesiccation and curettage   Informed consent: discussed and consent obtained   Timeout:  patient name, date of birth, surgical site, and procedure verified Anesthesia: the lesion was anesthetized in a standard fashion   Anesthetic:  1% lidocaine w/ epinephrine 1-100,000 buffered w/ 8.4% NaHCO3 Curettage performed in three different directions: Yes   Electrodesiccation performed over the curetted area: Yes   Curettage cycles:  3 Lesion length (cm):  1.1 Lesion width (cm):  1.2 Margin per side (cm):  0.2 Final wound size (cm):  1.6 Hemostasis achieved with:  electrodesiccation Outcome: patient tolerated procedure well with no complications   Post-procedure details: sterile dressing applied and wound care instructions given   Dressing type: petrolatum    Prior to numbing, we applied lidocaine 23%/tetracaine 7% cream at the [facility] sites for 20 minutes to help with patient's pain with lidocaine injections  5 cc lidocaine used total for all sites  Return in about 2 months (around 03/21/2020) for TBSE .  I, Marye Round, CMA, am acting as scribe for Forest Gleason, MD .  Documentation: I have reviewed the above documentation for accuracy and completeness, and I agree with the above.  Forest Gleason, MD

## 2020-01-23 NOTE — Telephone Encounter (Signed)
Pt scheduled for virtual appt with Dr Nicki Reaper tomorrow morning.

## 2020-01-23 NOTE — Telephone Encounter (Signed)
Pt called she is back to feeling like her head is underwater and she is coughing. She tried the Nasacort and it didn't help. She said that she feels like her BP is running high also it was 157/84 this morning

## 2020-01-24 ENCOUNTER — Encounter: Payer: Self-pay | Admitting: Internal Medicine

## 2020-01-24 ENCOUNTER — Telehealth (INDEPENDENT_AMBULATORY_CARE_PROVIDER_SITE_OTHER): Payer: Medicare PPO | Admitting: Internal Medicine

## 2020-01-24 ENCOUNTER — Other Ambulatory Visit: Payer: Self-pay

## 2020-01-24 DIAGNOSIS — R0982 Postnasal drip: Secondary | ICD-10-CM | POA: Diagnosis not present

## 2020-01-24 DIAGNOSIS — K219 Gastro-esophageal reflux disease without esophagitis: Secondary | ICD-10-CM

## 2020-01-24 NOTE — Progress Notes (Signed)
[name], female   DOB: 12/07/1940, 79 y.o.   MRN: [id number]   Virtual Visit via Telephone Note  This visit type was conducted due to national recommendations for restrictions regarding the COVID-19 pandemic (e.g. social distancing).  This format is felt to be most appropriate for this patient at this time.  All issues noted in this document were discussed and addressed.  No physical exam was performed (except for noted visual exam findings with Video Visits).   I connected with Marland Kitchen today by telephone and verified that I am speaking with the correct person using two identifiers. Location patient: home Location provider: work Persons participating in the telephone visit: patient, provider  The limitations, risks, security and privacy concerns of performing an evaluation and management service by telephone and the availability of in person appointments have been discussed.  The patient expressed understanding and agreed to proceed.   Reason for visit: work in appt.   HPI: Work in appt with concerns regarding drainage.  [facility] the [facility]10[facility].  States feels like her head is under water - fluttering in her ear.  No earache.  No fever.  No headache.  No nasal congestion.  Runny nose.  If goes outside, notices drainage.  Has to clear her throat.  No chest congestion.  Some previous cough, but no cough now.  Eating.  Staying hydrated.  Denies acid reflux.  Feels this is controlled.  Has hearing aid.  Not using.  Blood pressure this am - 129/74.  Discussed mucinex and nasacort.  Started mucinex recently.  Helping.  No chest pain or sob.     ROS: See pertinent positives and negatives per HPI.  Past Medical History:  Diagnosis Date  . BCC (basal cell carcinoma of skin) 03/30/2007   R inf med pretibial - BCC  . BCC (basal cell carcinoma of skin) 10/12/2013   L nasal ala - BCC  . BCC (basal cell carcinoma of skin) 03/10/2018   L mid dorsum med forearm -  superficial BCC   . Breast screening, unspecified 2013  . Cancer ([place]) 1992   skin  . Degenerative disk disease   . GERD (gastroesophageal reflux disease)   . Hypercholesterolemia 2008  . Interstitial cystitis    followed by Dr Jacqlyn Larsen  . Other sign and symptom in breast 2013   Left upper outer quadrant breast "soreness" Ultrasound exam of right breast in the 2 o'clock position with the breast distracted medially showed a 0.3-0.4 with 0.5 cm simple cyst. In the 1 o'clock position where pt reported tenderness US exam was negatiive.  Because of her history of intermittent nipple drainage, ultrasound was completed of the retroareolar area.  . Personal history of tobacco use, presenting hazards to health   . PONV (postoperative nausea and vomiting)   . SCC (squamous cell carcinoma) 03/28/2008   R dorsum hand - SCC  . SCC (squamous cell carcinoma) 05/09/2009   R lat lower leg - SCCIS  . SCC (squamous cell carcinoma) 05/09/2009   L lat lower leg - SCCIS  . SCC (squamous cell carcinoma) 04/07/2013   L lower leg - SCCIS  . SCC (squamous cell carcinoma) 04/27/2013   R forearm - SCC  . SCC (squamous cell carcinoma) 04/28/2017   L lat knee - SCCIS  . SCC (squamous cell carcinoma) 05/12/2018   L prox dorsum forearm - SCC  . SCC (squamous cell carcinoma), leg, right 03/30/2007   R sup pretibial - SCCIS  .  SCC (squamous cell carcinoma);BCC 10/12/2013   Mid back - superficial BCC with SCCIS   . Special screening for malignant neoplasms, colon     Past Surgical History:  Procedure Laterality Date  . ABDOMINAL HYSTERECTOMY  1992  . APPENDECTOMY    . CERVICAL DISCECTOMY     S/P C7-T1 discectomy with fusion  . CHOLECYSTECTOMY  1990  . COLONOSCOPY WITH PROPOFOL N/A 02/14/2016   Procedure: COLONOSCOPY WITH PROPOFOL;  Surgeon: Lucilla Lame, MD;  Location: [place];  Service: Endoscopy;  Laterality: N/A;  PT WOULD LIKE 10 ARRIVAL TIME OR LATER  . DILATION AND CURETTAGE OF UTERUS    .  ESOPHAGOGASTRODUODENOSCOPY (EGD) WITH PROPOFOL N/A 02/14/2016   Procedure: ESOPHAGOGASTRODUODENOSCOPY (EGD) WITH PROPOFOL with dialtion;  Surgeon: Lucilla Lame, MD;  Location: [place];  Service: Endoscopy;  Laterality: N/A;  . ESOPHAGOGASTRODUODENOSCOPY (EGD) WITH PROPOFOL N/A 04/13/2019   Procedure: ESOPHAGOGASTRODUODENOSCOPY (EGD) WITH PROPOFOL;  Surgeon: Toledo, Benay Pike, MD;  Location: [facility];  Service: Gastroenterology;  Laterality: N/A;  . MELANOMA EXCISION     removed from Left calf 1994    Family History  Problem Relation Age of Onset  . Hodgkin's lymphoma Mother   . Heart failure Father   . Heart attack Father   . Arthritis Sister        Three sisters w/ degeneratve disk disease  . Headache Sister        Two sisters hx of headache  . Breast cancer Neg Hx   . Colon cancer Neg Hx   . Bladder Cancer Neg Hx   . Kidney cancer Neg Hx     SOCIAL HX: reviewed.    Current Outpatient Medications:  .  acetaminophen (TYLENOL) 325 MG tablet, Take 650 mg by mouth as needed., Disp: , Rfl:  .  amLODipine (NORVASC) 2.5 MG tablet, Take 1 tablet (2.5 mg total) by mouth daily., Disp: 30 tablet, Rfl: 1 .  esomeprazole (NEXIUM) 40 MG capsule, Take 1 capsule (40 mg total) by mouth daily., Disp: 30 capsule, Rfl: 2 .  famotidine (PEPCID) 40 MG tablet, Take 1 tablet (40 mg total) by mouth daily., Disp: 30 tablet, Rfl: 2 .  ibuprofen (ADVIL,MOTRIN) 200 MG tablet, Take 200 mg by mouth every 6 (six) hours as needed., Disp: , Rfl:  .  imipramine (TOFRANIL) 10 MG tablet, Take 1 tablet (10 mg total) by mouth at bedtime., Disp: 90 tablet, Rfl: 3 .  lovastatin (MEVACOR) 20 MG tablet, Take 1 tablet (20 mg total) by mouth at bedtime., Disp: 90 tablet, Rfl: 1 .  Melatonin 10 MG CAPS, Take by mouth as needed. , Disp: , Rfl:  .  Multiple Vitamin (MULTI-VITAMIN DAILY PO), Take 1 tablet by mouth daily., Disp: , Rfl:  .  mupirocin ointment (BACTROBAN) 2 %, Apply to aa's of biopsy sites QD until  healed., Disp: 22 g, Rfl: 1 .  ondansetron (ZOFRAN-ODT) 4 MG disintegrating tablet, Take 1 tablet (4 mg total) by mouth every 8 (eight) hours as needed for nausea or vomiting., Disp: 20 tablet, Rfl: 0 .  trospium (SANCTURA) 20 MG tablet, Take 1 tablet (20 mg total) by mouth daily., Disp: 30 tablet, Rfl: 2  EXAM:  VITALS per patient if applicable: [id number]  GENERAL: alert.  Sounds to be in no acute distress.  Answering questions appropriately.    PSYCH/NEURO: pleasant and cooperative, no obvious depression or anxiety, speech and thought processing grossly intact  ASSESSMENT AND PLAN:  Discussed the following assessment and plan:  Post-nasal  drainage Drainage and ear fluttering as outlined.  Mucinex, nasacort nasal spray and saline nasal spray as outlined.  Hold abx.  Follow closely.  Notify me if symptoms progress or worsen.    GERD (gastroesophageal reflux disease) Controlled.  On nexium.  Follow.      I discussed the assessment and treatment plan with the patient. The patient was provided an opportunity to ask questions and all were answered. The patient agreed with the plan and demonstrated an understanding of the instructions.   The patient was advised to call back or seek an in-person evaluation if the symptoms worsen or if the condition fails to improve as anticipated.  I provided 22 minutes of non-face-to-face time during this encounter.   Einar Pheasant, MD

## 2020-01-29 ENCOUNTER — Encounter: Payer: Self-pay | Admitting: Internal Medicine

## 2020-01-29 DIAGNOSIS — R0982 Postnasal drip: Secondary | ICD-10-CM | POA: Insufficient documentation

## 2020-01-29 NOTE — Assessment & Plan Note (Signed)
Controlled.  On nexium.  Follow.

## 2020-01-29 NOTE — Assessment & Plan Note (Signed)
Drainage and ear fluttering as outlined.  Mucinex, nasacort nasal spray and saline nasal spray as outlined.  Hold abx.  Follow closely.  Notify me if symptoms progress or worsen.

## 2020-02-13 NOTE — Telephone Encounter (Signed)
Pt said she still feels like she is underwater. Her right ear is still stopped up. The medication didn't work and she believes she needs something else. She really feels like she has a sinus infection.

## 2020-02-13 NOTE — Telephone Encounter (Signed)
I have placed the order for ENT.  Let us know if any worsening problems.

## 2020-02-13 NOTE — Telephone Encounter (Signed)
Will you please triage her and make sure nothing else acute is going on. If Dr Nicki Reaper has any 12:00 openings this week, can be used for work in appt

## 2020-02-13 NOTE — Addendum Note (Signed)
Addended by: Alisa Graff on: 02/13/2020 04:53 PM   Modules accepted: Orders

## 2020-02-13 NOTE — Telephone Encounter (Signed)
Patient stated she has drainage down her throat and a headache. Keeps coughing up mucus. She believes she has a sinus infection. When she turns her head she feels like she has water in her ear. She hears bubbling. Appointment has been scheduled.

## 2020-02-13 NOTE — Telephone Encounter (Signed)
Patient is ok with an ENT referral. Very reluctant but she is ok with going. She feels all she needs is an antibiotic. Appointment would be better if it is this week per patient. Please advise.

## 2020-02-14 NOTE — Telephone Encounter (Signed)
Patient aware.

## 2020-02-17 DIAGNOSIS — H6501 Acute serous otitis media, right ear: Secondary | ICD-10-CM | POA: Diagnosis not present

## 2020-03-05 ENCOUNTER — Ambulatory Visit: Payer: Self-pay | Admitting: Physician Assistant

## 2020-03-05 ENCOUNTER — Other Ambulatory Visit: Payer: Self-pay

## 2020-03-05 ENCOUNTER — Ambulatory Visit: Payer: Medicare PPO | Admitting: Physician Assistant

## 2020-03-05 VITALS — BP 169/85 | HR 105 | Ht 64.0 in | Wt 146.0 lb

## 2020-03-05 DIAGNOSIS — N898 Other specified noninflammatory disorders of vagina: Secondary | ICD-10-CM | POA: Diagnosis not present

## 2020-03-05 DIAGNOSIS — N3010 Interstitial cystitis (chronic) without hematuria: Secondary | ICD-10-CM

## 2020-03-05 DIAGNOSIS — N3281 Overactive bladder: Secondary | ICD-10-CM

## 2020-03-05 LAB — BLADDER SCAN AMB NON-IMAGING: Scan Result: 60

## 2020-03-05 MED ORDER — TROSPIUM CHLORIDE 20 MG PO TABS: 20.0000 mg | ORAL_TABLET | Freq: Every day | ORAL | 11 refills | Status: DC

## 2020-03-05 NOTE — Progress Notes (Signed)
03/05/2020 10:08 AM   Rachael Austin 1941/04/16 [id number]  CC: Chief Complaint  Patient presents with  . Other    HPI: Rachael Austin is a 79 y.o. female with PMH IC on imipramine, OAB, and atrophic vaginitis who presents today for symptom recheck on trospium.  She was previously prescribed Myrbetriq for management of her urinary symptoms, however she developed diffuse pruritus that resolved with discontinuation of this med.  I prescribed trospium 20 mg daily as an alternative.  Today, patient reports improvement in her urgency and frequency on trospium.  She is still voiding every hour during the day, which represents an improvement over every 30 minutes prior to therapy.  She states her nocturia has improved as well.  She continues to experience urge incontinence, however this has also improved in volume and frequency; she wears pads for management of urinary leakage.  She has chronic constipation at baseline on a daily bowel regimen and notes does not feel this is significantly worse on anticholinergics.  Additionally, she reports significant improvement in her IC pain since starting imipramine.  She has not yet tried topical vaginal moisturizers, however she reports she has been using Desitin, which helps with her dryness.  She notes IC trigger triggers including acidic and high-fiber foods.  Overall, she is pleased with the "big improvement" in her urinary symptoms. PVR 73mL.  PMH: Past Medical History:  Diagnosis Date  . BCC (basal cell carcinoma of skin) 03/30/2007   R inf med pretibial - BCC  . BCC (basal cell carcinoma of skin) 10/12/2013   L nasal ala - BCC  . BCC (basal cell carcinoma of skin) 03/10/2018   L mid dorsum med forearm - superficial BCC   . Breast screening, unspecified 2013  . Cancer ([place]) 1992   skin  . Degenerative disk disease   . GERD (gastroesophageal reflux disease)   . Hypercholesterolemia 2008  . Interstitial cystitis    followed by  Dr Jacqlyn Larsen  . Other sign and symptom in breast 2013   Left upper outer quadrant breast "soreness" Ultrasound exam of right breast in the 2 o'clock position with the breast distracted medially showed a 0.3-0.4 with 0.5 cm simple cyst. In the 1 o'clock position where pt reported tenderness US exam was negatiive.  Because of her history of intermittent nipple drainage, ultrasound was completed of the retroareolar area.  . Personal history of tobacco use, presenting hazards to health   . PONV (postoperative nausea and vomiting)   . SCC (squamous cell carcinoma) 03/28/2008   R dorsum hand - SCC  . SCC (squamous cell carcinoma) 05/09/2009   R lat lower leg - SCCIS  . SCC (squamous cell carcinoma) 05/09/2009   L lat lower leg - SCCIS  . SCC (squamous cell carcinoma) 04/07/2013   L lower leg - SCCIS  . SCC (squamous cell carcinoma) 04/27/2013   R forearm - SCC  . SCC (squamous cell carcinoma) 04/28/2017   L lat knee - SCCIS  . SCC (squamous cell carcinoma) 05/12/2018   L prox dorsum forearm - SCC  . SCC (squamous cell carcinoma), leg, right 03/30/2007   R sup pretibial - SCCIS  . SCC (squamous cell carcinoma);BCC 10/12/2013   Mid back - superficial BCC with SCCIS   . Special screening for malignant neoplasms, colon     Surgical History: Past Surgical History:  Procedure Laterality Date  . ABDOMINAL HYSTERECTOMY  1992  . APPENDECTOMY    . CERVICAL DISCECTOMY  S/P C7-T1 discectomy with fusion  . CHOLECYSTECTOMY  1990  . COLONOSCOPY WITH PROPOFOL N/A 02/14/2016   Procedure: COLONOSCOPY WITH PROPOFOL;  Surgeon: Lucilla Lame, MD;  Location: [place];  Service: Endoscopy;  Laterality: N/A;  PT WOULD LIKE 10 ARRIVAL TIME OR LATER  . DILATION AND CURETTAGE OF UTERUS    . ESOPHAGOGASTRODUODENOSCOPY (EGD) WITH PROPOFOL N/A 02/14/2016   Procedure: ESOPHAGOGASTRODUODENOSCOPY (EGD) WITH PROPOFOL with dialtion;  Surgeon: Lucilla Lame, MD;  Location: [place];  Service: Endoscopy;   Laterality: N/A;  . ESOPHAGOGASTRODUODENOSCOPY (EGD) WITH PROPOFOL N/A 04/13/2019   Procedure: ESOPHAGOGASTRODUODENOSCOPY (EGD) WITH PROPOFOL;  Surgeon: Toledo, Benay Pike, MD;  Location: [facility];  Service: Gastroenterology;  Laterality: N/A;  . MELANOMA EXCISION     removed from Left calf 1994    Home Medications:  Allergies as of 03/05/2020      Reactions   Augmentin [amoxicillin-pot Clavulanate] Swelling, Rash   Swelling of the lips   Myrbetriq [mirabegron] Itching   Codeine Sulfate Other (See Comments)   "Makes her hyper"   Vesicare [solifenacin Succinate] Nausea And Vomiting, Rash      Medication List       Accurate as of March 05, 2020 10:08 AM. If you have any questions, ask your nurse or doctor.        acetaminophen 325 MG tablet Commonly known as: TYLENOL Take 650 mg by mouth as needed.   amLODipine 2.5 MG tablet Commonly known as: NORVASC Take 1 tablet (2.5 mg total) by mouth daily.   esomeprazole 40 MG capsule Commonly known as: NexIUM Take 1 capsule (40 mg total) by mouth daily.   famotidine 40 MG tablet Commonly known as: Pepcid Take 1 tablet (40 mg total) by mouth daily.   ibuprofen 200 MG tablet Commonly known as: ADVIL Take 200 mg by mouth every 6 (six) hours as needed.   imipramine 10 MG tablet Commonly known as: TOFRANIL Take 1 tablet (10 mg total) by mouth at bedtime.   lovastatin 20 MG tablet Commonly known as: MEVACOR Take 1 tablet (20 mg total) by mouth at bedtime.   Melatonin 10 MG Caps Take by mouth as needed.   MULTI-VITAMIN DAILY PO Take 1 tablet by mouth daily.   mupirocin ointment 2 % Commonly known as: BACTROBAN Apply to aa's of biopsy sites QD until healed.   ondansetron 4 MG disintegrating tablet Commonly known as: ZOFRAN-ODT Take 1 tablet (4 mg total) by mouth every 8 (eight) hours as needed for nausea or vomiting.   trospium 20 MG tablet Commonly known as: SANCTURA Take 1 tablet (20 mg total) by mouth daily.        Allergies:  Allergies  Allergen Reactions  . Augmentin [Amoxicillin-Pot Clavulanate] Swelling and Rash    Swelling of the lips  . Myrbetriq [Mirabegron] Itching  . Codeine Sulfate Other (See Comments)    "Makes her hyper"  . Vesicare [Solifenacin Succinate] Nausea And Vomiting and Rash    Family History: Family History  Problem Relation Age of Onset  . Hodgkin's lymphoma Mother   . Heart failure Father   . Heart attack Father   . Arthritis Sister        Three sisters w/ degeneratve disk disease  . Headache Sister        Two sisters hx of headache  . Breast cancer Neg Hx   . Colon cancer Neg Hx   . Bladder Cancer Neg Hx   . Kidney cancer Neg Hx  Social History:   reports that she has quit smoking. Her smoking use included cigarettes. She has a 15.00 pack-year smoking history. She has never used smokeless tobacco. She reports that she does not drink alcohol or use drugs.  Physical Exam: BP (!) 169/85   Pulse (!) 105   Ht 5\' 4"  (1.626 m)   Wt 146 lb (66.2 kg)   LMP 08/09/2012   BMI 25.06 kg/m   Constitutional:  Alert and oriented, no acute distress, nontoxic appearing HEENT: [place], AT Cardiovascular: No clubbing, cyanosis, or edema Respiratory: Normal respiratory effort, no increased work of breathing GI: Abdomen is soft, nontender, nondistended, no abdominal masses GU: No CVA tenderness Lymph: No cervical or inguinal lymphadenopathy Skin: No rashes, bruises or suspicious lesions Neurologic: Grossly intact, no focal deficits, moving all 4 extremities Psychiatric: Normal mood and affect  Laboratory Data: Results for orders placed or performed in visit on 03/05/20  Bladder Scan (Post Void Residual) in office  Result Value Ref Range   Scan Result 60    Assessment & Plan:   1. OAB (overactive bladder) Improved on trospium with PVR WNL; will refill for 1 year. Do not recommend dose increase due to patient age and history of chronic constipation. If OAB symptoms  or constipation worsen, recommend alternative therapies including intravesical Botox or PTNS. Reviewed these treatment options with patient today; she prefers to defer at this time. - Bladder Scan (Post Void Residual) in office - trospium (SANCTURA) 20 MG tablet; Take 1 tablet (20 mg total) by mouth daily.  Dispense: 30 tablet; Refill: 11  2. Interstitial cystitis Improved on imipramine; continue.  3. Vaginal dryness Improved with Desitin. Recommend Replens as alternative, may consider topical vaginal estrogen cream in the future if symptoms worsen or she develops rUTI.  Return in about 1 year (around 03/05/2021) for Symptom recheck with PVR.  Debroah Loop, PA-C  [facility] [address] [place],  [address] [phone]

## 2020-03-08 ENCOUNTER — Other Ambulatory Visit: Payer: Self-pay

## 2020-03-08 ENCOUNTER — Ambulatory Visit: Payer: Medicare PPO | Admitting: Internal Medicine

## 2020-03-08 DIAGNOSIS — K219 Gastro-esophageal reflux disease without esophagitis: Secondary | ICD-10-CM

## 2020-03-08 DIAGNOSIS — N3010 Interstitial cystitis (chronic) without hematuria: Secondary | ICD-10-CM

## 2020-03-08 DIAGNOSIS — M79604 Pain in right leg: Secondary | ICD-10-CM | POA: Diagnosis not present

## 2020-03-08 DIAGNOSIS — I700 Atherosclerosis of aorta: Secondary | ICD-10-CM | POA: Diagnosis not present

## 2020-03-08 DIAGNOSIS — E7800 Pure hypercholesterolemia, unspecified: Secondary | ICD-10-CM

## 2020-03-08 DIAGNOSIS — M79605 Pain in left leg: Secondary | ICD-10-CM | POA: Diagnosis not present

## 2020-03-08 DIAGNOSIS — I10 Essential (primary) hypertension: Secondary | ICD-10-CM | POA: Diagnosis not present

## 2020-03-08 DIAGNOSIS — F439 Reaction to severe stress, unspecified: Secondary | ICD-10-CM | POA: Diagnosis not present

## 2020-03-08 MED ORDER — TELMISARTAN 20 MG PO TABS: 20.0000 mg | ORAL_TABLET | Freq: Every day | ORAL | 1 refills | Status: DC

## 2020-03-08 MED ORDER — ESOMEPRAZOLE MAGNESIUM 40 MG PO CPDR: 40.0000 mg | DELAYED_RELEASE_CAPSULE | Freq: Every day | ORAL | 3 refills | Status: DC

## 2020-03-08 MED ORDER — LOVASTATIN 20 MG PO TABS: 20.0000 mg | ORAL_TABLET | Freq: Every day | ORAL | 1 refills | Status: DC

## 2020-03-08 MED ORDER — FAMOTIDINE 40 MG PO TABS: 40.0000 mg | ORAL_TABLET | Freq: Every day | ORAL | 3 refills | Status: DC

## 2020-03-08 NOTE — Progress Notes (Signed)
[name], female   DOB: 11-13-1940, 79 y.o.   MRN: [id number]   Subjective:    [name], female    DOB: 10-04-1940, 79 y.o.   MRN: [id number]  HPI This visit occurred during the SARS-CoV-2 public health emergency.  Safety protocols were in place, including screening questions prior to the visit, additional usage of staff PPE, and extensive cleaning of exam room while observing appropriate contact time as indicated for disinfecting solutions.  Patient here for a scheduled follow up.  She is having problems with elevated blood pressure.  Also seeing urology for overactive bladder.  Was started on trospium.  PVR wnl.  Per note, symptoms improved.  Has known IC.  Improved on imipramine.  She tries to stay active.  No chest pain or sob reported.  Some aching in her legs.  At times when standing, feels like legs are going to give away.  Was on amlodipine.  Caused itching.  Stopped recently.  Blood pressure elevated.  Discussed other treatment options.    Past Medical History:  Diagnosis Date  . BCC (basal cell carcinoma of skin) 03/30/2007   R inf med pretibial - BCC  . BCC (basal cell carcinoma of skin) 10/12/2013   L nasal ala - BCC  . BCC (basal cell carcinoma of skin) 03/10/2018   L mid dorsum med forearm - superficial BCC   . Breast screening, unspecified 2013  . Cancer ([place]) 1992   skin  . Degenerative disk disease   . GERD (gastroesophageal reflux disease)   . Hypercholesterolemia 2008  . Interstitial cystitis    followed by Dr Rachael Austin  . Other sign and symptom in breast 2013   Left upper outer quadrant breast "soreness" Ultrasound exam of right breast in the 2 o'clock position with the breast distracted medially showed a 0.3-0.4 with 0.5 cm simple cyst. In the 1 o'clock position where pt reported tenderness US exam was negatiive.  Because of her history of intermittent nipple drainage, ultrasound was completed of the retroareolar area.  . Personal  history of tobacco use, presenting hazards to health   . PONV (postoperative nausea and vomiting)   . SCC (squamous cell carcinoma) 03/28/2008   R dorsum hand - SCC  . SCC (squamous cell carcinoma) 05/09/2009   R lat lower leg - SCCIS  . SCC (squamous cell carcinoma) 05/09/2009   L lat lower leg - SCCIS  . SCC (squamous cell carcinoma) 04/07/2013   L lower leg - SCCIS  . SCC (squamous cell carcinoma) 04/27/2013   R forearm - SCC  . SCC (squamous cell carcinoma) 04/28/2017   L lat knee - SCCIS  . SCC (squamous cell carcinoma) 05/12/2018   L prox dorsum forearm - SCC  . SCC (squamous cell carcinoma), leg, right 03/30/2007   R sup pretibial - SCCIS  . SCC (squamous cell carcinoma);BCC 10/12/2013   Mid back - superficial BCC with SCCIS   . Special screening for malignant neoplasms, colon    Past Surgical History:  Procedure Laterality Date  . ABDOMINAL HYSTERECTOMY  1992  . APPENDECTOMY    . CERVICAL DISCECTOMY     S/P C7-T1 discectomy with fusion  . CHOLECYSTECTOMY  1990  . COLONOSCOPY WITH PROPOFOL N/A 02/14/2016   Procedure: COLONOSCOPY WITH PROPOFOL;  Surgeon: Lucilla Lame, MD;  Location: [facility];  Service: Endoscopy;  Laterality: N/A;  PT WOULD LIKE 10 ARRIVAL TIME OR LATER  . DILATION AND CURETTAGE OF UTERUS    .  ESOPHAGOGASTRODUODENOSCOPY (EGD) WITH PROPOFOL N/A 02/14/2016   Procedure: ESOPHAGOGASTRODUODENOSCOPY (EGD) WITH PROPOFOL with dialtion;  Surgeon: Lucilla Lame, MD;  Location: [facility];  Service: Endoscopy;  Laterality: N/A;  . ESOPHAGOGASTRODUODENOSCOPY (EGD) WITH PROPOFOL N/A 04/13/2019   Procedure: ESOPHAGOGASTRODUODENOSCOPY (EGD) WITH PROPOFOL;  Surgeon: Toledo, Benay Pike, MD;  Location: [facility];  Service: Gastroenterology;  Laterality: N/A;  . MELANOMA EXCISION     removed from Left calf 1994   Family History  Problem Relation Age of Onset  . Hodgkin's lymphoma Mother   . Heart failure Father   . Heart attack Father   . Arthritis  Sister        Three sisters w/ degeneratve disk disease  . Headache Sister        Two sisters hx of headache  . Breast cancer Neg Hx   . Colon cancer Neg Hx   . Bladder Cancer Neg Hx   . Kidney cancer Neg Hx    Social History   Socioeconomic History  . Marital status: Widowed    Spouse name: Not on file  . Number of children: 2  . Years of education: Not on file  . Highest education level: Not on file  Occupational History  . Not on file  Tobacco Use  . Smoking status: Former Smoker    Packs/day: 1.00    Years: 15.00    Pack years: 15.00    Types: Cigarettes  . Smokeless tobacco: Never Used  Substance and Sexual Activity  . Alcohol use: No    Alcohol/week: 0.0 standard drinks  . Drug use: No  . Sexual activity: Not on file  Other Topics Concern  . Not on file  Social History Narrative   Married and has 2 children, daughters.   Social Determinants of Health   Financial Resource Strain:   . Difficulty of Paying Living Expenses:   Food Insecurity:   . Worried About Charity fundraiser in the Last Year:   . Arboriculturist in the Last Year:   Transportation Needs:   . Film/video editor (Medical):   Marland Kitchen Lack of Transportation (Non-Medical):   Physical Activity:   . Days of Exercise per Week:   . Minutes of Exercise per Session:   Stress:   . Feeling of Stress :   Social Connections:   . Frequency of Communication with Friends and Family:   . Frequency of Social Gatherings with Friends and Family:   . Attends Religious Services:   . Active Member of Clubs or Organizations:   . Attends Archivist Meetings:   Marland Kitchen Marital Status:     Outpatient Encounter Medications as of 03/08/2020  Medication Sig  . acetaminophen (TYLENOL) 325 MG tablet Take 650 mg by mouth as needed.  Marland Kitchen esomeprazole (NEXIUM) 40 MG capsule Take 1 capsule (40 mg total) by mouth daily.  . famotidine (PEPCID) 40 MG tablet Take 1 tablet (40 mg total) by mouth daily.  Marland Kitchen ibuprofen  (ADVIL,MOTRIN) 200 MG tablet Take 200 mg by mouth every 6 (six) hours as needed.  Marland Kitchen imipramine (TOFRANIL) 10 MG tablet Take 1 tablet (10 mg total) by mouth at bedtime.  . lovastatin (MEVACOR) 20 MG tablet Take 1 tablet (20 mg total) by mouth at bedtime.  . Melatonin 10 MG CAPS Take by mouth as needed.   . Multiple Vitamin (MULTI-VITAMIN DAILY PO) Take 1 tablet by mouth daily.  . mupirocin ointment (BACTROBAN) 2 % Apply to aa's of biopsy sites  QD until healed.  . ondansetron (ZOFRAN-ODT) 4 MG disintegrating tablet Take 1 tablet (4 mg total) by mouth every 8 (eight) hours as needed for nausea or vomiting.  Marland Kitchen telmisartan (MICARDIS) 20 MG tablet Take 1 tablet (20 mg total) by mouth daily.  . trospium (SANCTURA) 20 MG tablet Take 1 tablet (20 mg total) by mouth daily.  . [DISCONTINUED] amLODipine (NORVASC) 2.5 MG tablet Take 1 tablet (2.5 mg total) by mouth daily.  . [DISCONTINUED] esomeprazole (NEXIUM) 40 MG capsule Take 1 capsule (40 mg total) by mouth daily.  . [DISCONTINUED] famotidine (PEPCID) 40 MG tablet Take 1 tablet (40 mg total) by mouth daily.  . [DISCONTINUED] lovastatin (MEVACOR) 20 MG tablet Take 1 tablet (20 mg total) by mouth at bedtime.   No facility-administered encounter medications on file as of 03/08/2020.    Review of Systems  Constitutional: Negative for appetite change and unexpected weight change.  HENT: Negative for congestion and sinus pressure.   Respiratory: Negative for cough, chest tightness and shortness of breath.   Cardiovascular: Negative for chest pain, palpitations and leg swelling.  Gastrointestinal: Negative for abdominal pain, diarrhea, nausea and vomiting.  Genitourinary: Negative for difficulty urinating and dysuria.  Musculoskeletal: Negative for joint swelling and myalgias.  Skin: Negative for color change and rash.  Neurological: Negative for dizziness, light-headedness and headaches.  Psychiatric/Behavioral: Negative for agitation and dysphoric mood.        Objective:    Physical Exam Vitals reviewed.  Constitutional:      General: She is not in acute distress.    Appearance: Normal appearance.  HENT:     Head: Normocephalic and atraumatic.     Right Ear: External ear normal.     Left Ear: External ear normal.  Eyes:     General: No scleral icterus.       Right eye: No discharge.        Left eye: No discharge.     Conjunctiva/sclera: Conjunctivae normal.  Neck:     Thyroid: No thyromegaly.  Cardiovascular:     Rate and Rhythm: Normal rate and regular rhythm.  Pulmonary:     Effort: No respiratory distress.     Breath sounds: Normal breath sounds. No wheezing.  Abdominal:     General: Bowel sounds are normal.     Palpations: Abdomen is soft.     Tenderness: There is no abdominal tenderness.  Musculoskeletal:        General: No swelling or tenderness.     Cervical back: Neck supple. No tenderness.  Lymphadenopathy:     Cervical: No cervical adenopathy.  Skin:    Findings: No erythema or rash.  Neurological:     Mental Status: She is alert.  Psychiatric:        Mood and Affect: Mood normal.        Behavior: Behavior normal.     BP (!) 152/78   Pulse 92   Temp (!) 97.2 F (36.2 C)   Resp 16   Ht 5\' 4"  (1.626 m)   Wt 147 lb 9.6 oz (67 kg)   LMP 08/09/2012   SpO2 99%   BMI 25.34 kg/m  Wt Readings from Last 3 Encounters:  03/08/20 147 lb 9.6 oz (67 kg)  03/05/20 146 lb (66.2 kg)  01/24/20 149 lb (67.6 kg)     Lab Results  Component Value Date   WBC 4.3 09/03/2019   HGB 12.9 09/03/2019   HCT 37.8 09/03/2019   PLT 172 09/03/2019  GLUCOSE 101 (H) 01/04/2020   CHOL 184 01/04/2020   TRIG 54.0 01/04/2020   HDL 83.00 01/04/2020   LDLCALC 90 01/04/2020   ALT 10 01/04/2020   AST 15 01/04/2020   NA 141 01/04/2020   K 4.4 01/04/2020   CL 106 01/04/2020   CREATININE 0.95 01/04/2020   BUN 8 01/04/2020   CO2 28 01/04/2020   TSH 3.13 06/07/2019   HGBA1C 5.8 06/07/2019    DG Bone Density  Result  Date: 01/04/2020 EXAM: DUAL X-RAY ABSORPTIOMETRY (DXA) FOR BONE MINERAL DENSITY IMPRESSION: Your patient Rachael Austin completed a BMD test on 01/04/2020 using the Axis (software version: 14.10) manufactured by [employer]. The following summarizes the results of our evaluation. Technologist: MTB PATIENT BIOGRAPHICAL: Name: Tyiesha, Brackney Patient ID: [id number] Birth Date: Jan 30, 1941 Height:     64.0 in. Gender: Female Exam Date: 01/04/2020 Weight:     149.0 lbs. Indications: Caucasian, Hysterectomy, Oophorectomy Bilateral, Postmenopausal Fractures: Treatments: Multi-Vitamin, Nexium, Vitamin D DENSITOMETRY RESULTS: Site         Region     Measured Date Measured Age WHO Classification Young Adult T-score BMD         %Change vs. Previous Significant Change (*) DualFemur Neck Right 01/04/2020 78.9 Osteoporosis -2.7 0.669 g/cm2 - - DualFemur Total Mean 01/04/2020 79.9 Osteopenia -2.3 0.720 g/cm2 - - Left Forearm Radius 33% 01/04/2020 79.9 Osteoporosis -3.1 0.602 g/cm2 - - ASSESSMENT: The BMD measured at Forearm Radius 33% is 0.602 g/cm2 with a T-score of -3.1. This patient is considered OSTEOPOROTIC according to [facility]) criteria. The scan quality is good. Lumbar spine was not utilized due to advanced degenerative changes. World Pharmacologist [facility]) criteria for post-menopausal, Caucasian Women: Normal:                   T-score at or above -1 SD Osteopenia/low bone mass: T-score between -1 and -2.5 SD Osteoporosis:             T-score at or below -2.5 SD RECOMMENDATIONS: 1. All patients should optimize calcium and vitamin D intake. 2. Consider FDA-approved medical therapies in postmenopausal women and men aged 30 years and older, based on the following: a. A hip or vertebral(clinical or morphometric) fracture b. T-score < -2.5 at the femoral neck or spine after appropriate evaluation to exclude secondary causes c. Low bone mass (T-score between -1.0 and  -2.5 at the femoral neck or spine) and a 10-year probability of a hip fracture > 3% or a 10-year probability of a major osteoporosis-related fracture > 20% based on the [place]-adapted WHO algorithm 3. Clinician judgment and/or patient preferences may indicate treatment for people with 10-year fracture probabilities above or below these levels FOLLOW-UP: People with diagnosed cases of osteoporosis or at high risk for fracture should have regular bone mineral density tests. For patients eligible for Medicare, routine testing is allowed once every 2 years. The testing frequency can be increased to one year for patients who have rapidly progressing disease, those who are receiving or discontinuing medical therapy to restore bone mass, or have additional risk factors. I have reviewed this report, and agree with the above findings. Mark A. Thornton Papas, M.D. [facility] Electronically Signed   By: Lavonia Dana M.D.   On: 01/04/2020 12:22   MM 3D SCREEN BREAST BILATERAL  Result Date: 01/04/2020 CLINICAL DATA:  Screening. EXAM: DIGITAL SCREENING BILATERAL MAMMOGRAM WITH TOMO AND CAD COMPARISON:  Previous exam(s). ACR Breast Density Category  b: There are scattered areas of fibroglandular density. FINDINGS: There are no findings suspicious for malignancy. Images were processed with CAD. IMPRESSION: No mammographic evidence of malignancy. A result letter of this screening mammogram will be mailed directly to the patient. RECOMMENDATION: Screening mammogram in one year. (Code:SM-B-01Y) BI-RADS CATEGORY  1: Negative. Electronically Signed   By: Lillia Mountain M.D.   On: 01/04/2020 12:38       Assessment & Plan:   Problem List Items Addressed This Visit    Aortic atherosclerosis ([place])    On mevacor.        Relevant Medications   telmisartan (MICARDIS) 20 MG tablet   lovastatin (MEVACOR) 20 MG tablet   Chronic interstitial cystitis    Followed by urology.  On imipramine.  Follow.       Essential hypertension      Blood pressure elevated.  Itching on amlodipine.  Remain off amlodipine.  Start micardis.  Follow pressures.  Follow metabolic panel.        Relevant Medications   telmisartan (MICARDIS) 20 MG tablet   lovastatin (MEVACOR) 20 MG tablet   Other Relevant Orders   Basic metabolic panel   GERD (gastroesophageal reflux disease)    No upper symptoms reported.  Nexium.  Follow.        Relevant Medications   esomeprazole (NEXIUM) 40 MG capsule   famotidine (PEPCID) 40 MG tablet   Hypercholesterolemia    On mevacor.  Low cholesterol diet and exercise.  Follow lipid panel and liver function tests.        Relevant Medications   telmisartan (MICARDIS) 20 MG tablet   lovastatin (MEVACOR) 20 MG tablet   Leg pain    Described leg aching.  Feeling as if will give away at times - if stands for long periods.  Will notify me if desires further evaluation and w/up.  Consider PT.       Stress    Overall handling things relatively well.  Follow.            Einar Pheasant, MD

## 2020-03-16 ENCOUNTER — Encounter: Payer: Self-pay | Admitting: Internal Medicine

## 2020-03-16 DIAGNOSIS — M79606 Pain in leg, unspecified: Secondary | ICD-10-CM | POA: Insufficient documentation

## 2020-03-16 NOTE — Assessment & Plan Note (Signed)
Overall handling things relatively well.  Follow.  

## 2020-03-16 NOTE — Assessment & Plan Note (Signed)
Blood pressure elevated.  Itching on amlodipine.  Remain off amlodipine.  Start micardis.  Follow pressures.  Follow metabolic panel.

## 2020-03-16 NOTE — Assessment & Plan Note (Signed)
On mevacor.

## 2020-03-16 NOTE — Assessment & Plan Note (Signed)
No upper symptoms reported.  Nexium.  Follow.

## 2020-03-16 NOTE — Assessment & Plan Note (Signed)
On mevacor.  Low cholesterol diet and exercise.  Follow lipid panel and liver function tests.  

## 2020-03-16 NOTE — Assessment & Plan Note (Signed)
Followed by urology.  On imipramine.  Follow.

## 2020-03-16 NOTE — Assessment & Plan Note (Signed)
Described leg aching.  Feeling as if will give away at times - if stands for long periods.  Will notify me if desires further evaluation and w/up.  Consider PT.

## 2020-03-19 ENCOUNTER — Other Ambulatory Visit: Payer: Self-pay

## 2020-03-19 ENCOUNTER — Other Ambulatory Visit (INDEPENDENT_AMBULATORY_CARE_PROVIDER_SITE_OTHER): Payer: Medicare PPO

## 2020-03-19 DIAGNOSIS — I10 Essential (primary) hypertension: Secondary | ICD-10-CM | POA: Diagnosis not present

## 2020-03-19 LAB — BASIC METABOLIC PANEL
BUN: 7 mg/dL (ref 6–23)
CO2: 27 mEq/L (ref 19–32)
Calcium: 8.9 mg/dL (ref 8.4–10.5)
Chloride: 100 mEq/L (ref 96–112)
Creatinine, Ser: 0.99 mg/dL (ref 0.40–1.20)
GFR: 53.94 mL/min — ABNORMAL LOW (ref >60.00)
Glucose, Bld: 98 mg/dL (ref 70–99)
Potassium: 4.7 mEq/L (ref 3.5–5.1)
Sodium: 133 mEq/L — ABNORMAL LOW (ref 135–145)

## 2020-03-28 ENCOUNTER — Ambulatory Visit: Payer: Medicare PPO | Admitting: Dermatology

## 2020-04-06 ENCOUNTER — Ambulatory Visit: Payer: Medicare PPO | Admitting: Internal Medicine

## 2020-04-06 ENCOUNTER — Encounter: Payer: Self-pay | Admitting: Internal Medicine

## 2020-04-06 ENCOUNTER — Other Ambulatory Visit: Payer: Self-pay

## 2020-04-06 DIAGNOSIS — R251 Tremor, unspecified: Secondary | ICD-10-CM | POA: Diagnosis not present

## 2020-04-06 DIAGNOSIS — E7800 Pure hypercholesterolemia, unspecified: Secondary | ICD-10-CM | POA: Diagnosis not present

## 2020-04-06 DIAGNOSIS — L989 Disorder of the skin and subcutaneous tissue, unspecified: Secondary | ICD-10-CM

## 2020-04-06 DIAGNOSIS — R0989 Other specified symptoms and signs involving the circulatory and respiratory systems: Secondary | ICD-10-CM

## 2020-04-06 DIAGNOSIS — F439 Reaction to severe stress, unspecified: Secondary | ICD-10-CM

## 2020-04-06 DIAGNOSIS — K219 Gastro-esophageal reflux disease without esophagitis: Secondary | ICD-10-CM

## 2020-04-06 DIAGNOSIS — D485 Neoplasm of uncertain behavior of skin: Secondary | ICD-10-CM

## 2020-04-06 DIAGNOSIS — I700 Atherosclerosis of aorta: Secondary | ICD-10-CM | POA: Diagnosis not present

## 2020-04-06 DIAGNOSIS — I10 Essential (primary) hypertension: Secondary | ICD-10-CM | POA: Diagnosis not present

## 2020-04-06 DIAGNOSIS — N3010 Interstitial cystitis (chronic) without hematuria: Secondary | ICD-10-CM | POA: Diagnosis not present

## 2020-04-06 MED ORDER — ESCITALOPRAM OXALATE 5 MG PO TABS
5.0000 mg | ORAL_TABLET | Freq: Every day | ORAL | 1 refills | Status: DC
Start: 2020-04-06 — End: 2020-05-22

## 2020-04-06 MED ORDER — MUPIROCIN 2 % EX OINT: TOPICAL_OINTMENT | CUTANEOUS | 0 refills | Status: DC

## 2020-04-06 NOTE — Progress Notes (Signed)
[name], female   DOB: Nov 03, 1940, 79 y.o.   MRN: [id number]   Subjective:    [name], female    DOB: 10/31/1940, 79 y.o.   MRN: [id number]  HPI This visit occurred during the SARS-CoV-2 public health emergency.  Safety protocols were in place, including screening questions prior to the visit, additional usage of staff PPE, and extensive cleaning of exam room while observing appropriate contact time as indicated for disinfecting solutions.  Patient here for a scheduled follow up.  Reports increased stress and anxiety.  Feels shaking inside more.  She relates this to increased stress.  Reports kids are knocking on doors late at night at her house and some of her neighbors houses.  Has called the police.  Some increased stress overall.  Feels she needs to be on something to help level things out.  No suicidal thoughts.  desires not to restart zoloft.  Taking micardis.  Feels better taking in the evening.  Blood pressure doing better.  No chest pain or sob reported.  No abdominal pain or bowel change reported.  Right anterior leg lesion.  No leg pain or swelling.     Past Medical History:  Diagnosis Date  . BCC (basal cell carcinoma of skin) 03/30/2007   R inf med pretibial - BCC  . BCC (basal cell carcinoma of skin) 10/12/2013   L nasal ala - BCC  . BCC (basal cell carcinoma of skin) 03/10/2018   L mid dorsum med forearm - superficial BCC   . Breast screening, unspecified 2013  . Cancer ([place]) 1992   skin  . Degenerative disk disease   . GERD (gastroesophageal reflux disease)   . Hypercholesterolemia 2008  . Interstitial cystitis    followed by Dr Jacqlyn Larsen  . Other sign and symptom in breast 2013   Left upper outer quadrant breast "soreness" Ultrasound exam of right breast in the 2 o'clock position with the breast distracted medially showed a 0.3-0.4 with 0.5 cm simple cyst. In the 1 o'clock position where pt reported tenderness US exam was negatiive.   Because of her history of intermittent nipple drainage, ultrasound was completed of the retroareolar area.  . Personal history of tobacco use, presenting hazards to health   . PONV (postoperative nausea and vomiting)   . SCC (squamous cell carcinoma) 03/28/2008   R dorsum hand - SCC  . SCC (squamous cell carcinoma) 05/09/2009   R lat lower leg - SCCIS  . SCC (squamous cell carcinoma) 05/09/2009   L lat lower leg - SCCIS  . SCC (squamous cell carcinoma) 04/07/2013   L lower leg - SCCIS  . SCC (squamous cell carcinoma) 04/27/2013   R forearm - SCC  . SCC (squamous cell carcinoma) 04/28/2017   L lat knee - SCCIS  . SCC (squamous cell carcinoma) 05/12/2018   L prox dorsum forearm - SCC  . SCC (squamous cell carcinoma), leg, right 03/30/2007   R sup pretibial - SCCIS  . SCC (squamous cell carcinoma);BCC 10/12/2013   Mid back - superficial BCC with SCCIS   . Special screening for malignant neoplasms, colon    Past Surgical History:  Procedure Laterality Date  . ABDOMINAL HYSTERECTOMY  1992  . APPENDECTOMY    . CERVICAL DISCECTOMY     S/P C7-T1 discectomy with fusion  . CHOLECYSTECTOMY  1990  . COLONOSCOPY WITH PROPOFOL N/A 02/14/2016   Procedure: COLONOSCOPY WITH PROPOFOL;  Surgeon: Lucilla Lame, MD;  Location: [facility];  Service: Endoscopy;  Laterality: N/A;  PT WOULD LIKE 10 ARRIVAL TIME OR LATER  . DILATION AND CURETTAGE OF UTERUS    . ESOPHAGOGASTRODUODENOSCOPY (EGD) WITH PROPOFOL N/A 02/14/2016   Procedure: ESOPHAGOGASTRODUODENOSCOPY (EGD) WITH PROPOFOL with dialtion;  Surgeon: Lucilla Lame, MD;  Location: [facility];  Service: Endoscopy;  Laterality: N/A;  . ESOPHAGOGASTRODUODENOSCOPY (EGD) WITH PROPOFOL N/A 04/13/2019   Procedure: ESOPHAGOGASTRODUODENOSCOPY (EGD) WITH PROPOFOL;  Surgeon: Toledo, Benay Pike, MD;  Location: [facility];  Service: Gastroenterology;  Laterality: N/A;  . MELANOMA EXCISION     removed from Left calf 1994   Family History  Problem  Relation Age of Onset  . Hodgkin's lymphoma Mother   . Heart failure Father   . Heart attack Father   . Arthritis Sister        Three sisters w/ degeneratve disk disease  . Headache Sister        Two sisters hx of headache  . Breast cancer Neg Hx   . Colon cancer Neg Hx   . Bladder Cancer Neg Hx   . Kidney cancer Neg Hx    Social History   Socioeconomic History  . Marital status: Widowed    Spouse name: Not on file  . Number of children: 2  . Years of education: Not on file  . Highest education level: Not on file  Occupational History  . Not on file  Tobacco Use  . Smoking status: Former Smoker    Packs/day: 1.00    Years: 15.00    Pack years: 15.00    Types: Cigarettes  . Smokeless tobacco: Never Used  Substance and Sexual Activity  . Alcohol use: No    Alcohol/week: 0.0 standard drinks  . Drug use: No  . Sexual activity: Not on file  Other Topics Concern  . Not on file  Social History Narrative   Married and has 2 children, daughters.   Social Determinants of Health   Financial Resource Strain:   . Difficulty of Paying Living Expenses:   Food Insecurity:   . Worried About Charity fundraiser in the Last Year:   . Arboriculturist in the Last Year:   Transportation Needs:   . Film/video editor (Medical):   Marland Kitchen Lack of Transportation (Non-Medical):   Physical Activity:   . Days of Exercise per Week:   . Minutes of Exercise per Session:   Stress:   . Feeling of Stress :   Social Connections:   . Frequency of Communication with Friends and Family:   . Frequency of Social Gatherings with Friends and Family:   . Attends Religious Services:   . Active Member of Clubs or Organizations:   . Attends Archivist Meetings:   Marland Kitchen Marital Status:     Outpatient Encounter Medications as of 04/06/2020  Medication Sig  . acetaminophen (TYLENOL) 325 MG tablet Take 650 mg by mouth as needed.  Marland Kitchen escitalopram (LEXAPRO) 5 MG tablet Take 1 tablet (5 mg total) by  mouth daily.  Marland Kitchen esomeprazole (NEXIUM) 40 MG capsule Take 1 capsule (40 mg total) by mouth daily.  . famotidine (PEPCID) 40 MG tablet Take 1 tablet (40 mg total) by mouth daily.  Marland Kitchen ibuprofen (ADVIL,MOTRIN) 200 MG tablet Take 200 mg by mouth every 6 (six) hours as needed.  Marland Kitchen imipramine (TOFRANIL) 10 MG tablet Take 1 tablet (10 mg total) by mouth at bedtime.  . lovastatin (MEVACOR) 20 MG tablet Take 1 tablet (20 mg  total) by mouth at bedtime.  . Melatonin 10 MG CAPS Take by mouth as needed.   . Multiple Vitamin (MULTI-VITAMIN DAILY PO) Take 1 tablet by mouth daily.  . mupirocin ointment (BACTROBAN) 2 % Apply to affected area bid  . ondansetron (ZOFRAN-ODT) 4 MG disintegrating tablet Take 1 tablet (4 mg total) by mouth every 8 (eight) hours as needed for nausea or vomiting.  Marland Kitchen telmisartan (MICARDIS) 20 MG tablet Take 1 tablet (20 mg total) by mouth daily.  . trospium (SANCTURA) 20 MG tablet Take 1 tablet (20 mg total) by mouth daily.  . [DISCONTINUED] mupirocin ointment (BACTROBAN) 2 % Apply to aa's of biopsy sites QD until healed.   No facility-administered encounter medications on file as of 04/06/2020.    Review of Systems  Constitutional: Negative for appetite change and unexpected weight change.  HENT: Negative for congestion and sinus pressure.   Respiratory: Negative for cough, chest tightness and shortness of breath.   Cardiovascular: Negative for chest pain, palpitations and leg swelling.  Gastrointestinal: Negative for abdominal pain, diarrhea, nausea and vomiting.  Genitourinary: Negative for difficulty urinating and dysuria.  Musculoskeletal: Negative for joint swelling and myalgias.  Skin: Negative for color change and rash.  Neurological: Negative for dizziness, light-headedness and headaches.  Psychiatric/Behavioral: Negative for dysphoric mood.       Increased stress as outlined.         Objective:    Physical Exam Constitutional:      General: She is not in acute  distress.    Appearance: Normal appearance.  HENT:     Head: Normocephalic and atraumatic.     Right Ear: External ear normal.     Left Ear: External ear normal.  Eyes:     General: No scleral icterus.       Right eye: No discharge.        Left eye: No discharge.     Conjunctiva/sclera: Conjunctivae normal.  Neck:     Thyroid: No thyromegaly.  Cardiovascular:     Rate and Rhythm: Normal rate and regular rhythm.     Comments: Left carotid bruit.  Pulmonary:     Effort: No respiratory distress.     Breath sounds: Normal breath sounds. No wheezing.  Abdominal:     General: Bowel sounds are normal.     Palpations: Abdomen is soft.     Tenderness: There is no abdominal tenderness.  Musculoskeletal:        General: No swelling or tenderness.     Cervical back: Neck supple. No tenderness.  Lymphadenopathy:     Cervical: No cervical adenopathy.  Skin:    Findings: No erythema or rash.  Neurological:     Mental Status: She is alert.  Psychiatric:        Mood and Affect: Mood normal.        Behavior: Behavior normal.     BP 126/68   Pulse 95   Temp 98 F (36.7 C)   Resp 16   Ht 5\' 4"  (1.626 m)   Wt 148 lb (67.1 kg)   LMP 08/09/2012   SpO2 98%   BMI 25.40 kg/m  Wt Readings from Last 3 Encounters:  04/06/20 148 lb (67.1 kg)  03/08/20 147 lb 9.6 oz (67 kg)  03/05/20 146 lb (66.2 kg)     Lab Results  Component Value Date   WBC 4.3 09/03/2019   HGB 12.9 09/03/2019   HCT 37.8 09/03/2019   PLT 172 09/03/2019  GLUCOSE 98 03/19/2020   CHOL 184 01/04/2020   TRIG 54.0 01/04/2020   HDL 83.00 01/04/2020   LDLCALC 90 01/04/2020   ALT 10 01/04/2020   AST 15 01/04/2020   NA 133 (L) 03/19/2020   K 4.7 03/19/2020   CL 100 03/19/2020   CREATININE 0.99 03/19/2020   BUN 7 03/19/2020   CO2 27 03/19/2020   TSH 3.13 06/07/2019   HGBA1C 5.8 06/07/2019    DG Bone Density  Result Date: 01/04/2020 EXAM: DUAL X-RAY ABSORPTIOMETRY (DXA) FOR BONE MINERAL DENSITY IMPRESSION:  Your patient Ninoska Goswick completed a BMD test on 01/04/2020 using the [employer] (software version: 14.10) manufactured by [employer]. The following summarizes the results of our evaluation. Technologist: MTB PATIENT BIOGRAPHICAL: Name: Lania, Zawistowski Patient ID: [id number] Birth Date: 01/26/1941 Height:     64.0 in. Gender: Female Exam Date: 01/04/2020 Weight:     149.0 lbs. Indications: Caucasian, Hysterectomy, Oophorectomy Bilateral, Postmenopausal Fractures: Treatments: Multi-Vitamin, Nexium, Vitamin D DENSITOMETRY RESULTS: Site         Region     Measured Date Measured Age WHO Classification Young Adult T-score BMD         %Change vs. Previous Significant Change (*) DualFemur Neck Right 01/04/2020 79.9 Osteoporosis -2.7 0.669 g/cm2 - - DualFemur Total Mean 01/04/2020 79.9 Osteopenia -2.3 0.720 g/cm2 - - Left Forearm Radius 33% 01/04/2020 79.9 Osteoporosis -3.1 0.602 g/cm2 - - ASSESSMENT: The BMD measured at Forearm Radius 33% is 0.602 g/cm2 with a T-score of -3.1. This patient is considered OSTEOPOROTIC according to [facility]) criteria. The scan quality is good. Lumbar spine was not utilized due to advanced degenerative changes. World Pharmacologist [facility]) criteria for post-menopausal, Caucasian Women: Normal:                   T-score at or above -1 SD Osteopenia/low bone mass: T-score between -1 and -2.5 SD Osteoporosis:             T-score at or below -2.5 SD RECOMMENDATIONS: 1. All patients should optimize calcium and vitamin D intake. 2. Consider FDA-approved medical therapies in postmenopausal women and men aged 79 years and older, based on the following: a. A hip or vertebral(clinical or morphometric) fracture b. T-score < -2.5 at the femoral neck or spine after appropriate evaluation to exclude secondary causes c. Low bone mass (T-score between -1.0 and -2.5 at the femoral neck or spine) and a 10-year probability of a hip fracture > 3% or a  10-year probability of a major osteoporosis-related fracture > 20% based on the [place]-adapted WHO algorithm 3. Clinician judgment and/or patient preferences may indicate treatment for people with 10-year fracture probabilities above or below these levels FOLLOW-UP: People with diagnosed cases of osteoporosis or at high risk for fracture should have regular bone mineral density tests. For patients eligible for Medicare, routine testing is allowed once every 2 years. The testing frequency can be increased to one year for patients who have rapidly progressing disease, those who are receiving or discontinuing medical therapy to restore bone mass, or have additional risk factors. I have reviewed this report, and agree with the above findings. Mark A. Thornton Papas, M.D. [facility] Electronically Signed   By: Lavonia Dana M.D.   On: 01/04/2020 12:22   MM 3D SCREEN BREAST BILATERAL  Result Date: 01/04/2020 CLINICAL DATA:  Screening. EXAM: DIGITAL SCREENING BILATERAL MAMMOGRAM WITH TOMO AND CAD COMPARISON:  Previous exam(s). ACR Breast Density Category  b: There are scattered areas of fibroglandular density. FINDINGS: There are no findings suspicious for malignancy. Images were processed with CAD. IMPRESSION: No mammographic evidence of malignancy. A result letter of this screening mammogram will be mailed directly to the patient. RECOMMENDATION: Screening mammogram in one year. (Code:SM-B-01Y) BI-RADS CATEGORY  1: Negative. Electronically Signed   By: Lillia Mountain M.D.   On: 01/04/2020 12:38       Assessment & Plan:   Problem List Items Addressed This Visit    Aortic atherosclerosis ([place])    On mevacor.       Chronic interstitial cystitis    Followed by urology.  On low dose imipramine and sanctura.  Follow.        Essential hypertension    On micardis.  Tolerating if takes in the evening.  Blood pressure better.  Follow pressures.  Follow metabolic panel.       GERD (gastroesophageal reflux  disease)    Upper symptoms controlled.  On nexium.  Follow.        Hypercholesterolemia    On mevacor.  Low cholesterol diet and exercise.  Follow lipid panel and liver function tests.        Left carotid bruit    Schedule carotid ultrasound.       Relevant Orders   VAS US CAROTID   Leg lesion    Right anterior leg lesion.  Bactroban.  Follow.        Stress    Increased stress as outlined.  Discussed with her today.  Will start low dose lexapro 5mg  q day.  Follow.  Get her back in soon to reassess.        Tremor    Has been evaluated previously by neurology.  MRI without acute changes.  Discussed with her today.  She feels increased stress contributing.  Wants something to help level things out.  Follow.         Other Visit Diagnoses    Neoplasm of uncertain behavior of skin       Relevant Medications   mupirocin ointment (BACTROBAN) 2 %       Einar Pheasant, MD

## 2020-04-07 ENCOUNTER — Encounter: Payer: Self-pay | Admitting: Internal Medicine

## 2020-04-07 DIAGNOSIS — R0989 Other specified symptoms and signs involving the circulatory and respiratory systems: Secondary | ICD-10-CM | POA: Insufficient documentation

## 2020-04-07 DIAGNOSIS — L989 Disorder of the skin and subcutaneous tissue, unspecified: Secondary | ICD-10-CM | POA: Insufficient documentation

## 2020-04-07 NOTE — Assessment & Plan Note (Signed)
Right anterior leg lesion.  Bactroban.  Follow.

## 2020-04-07 NOTE — Assessment & Plan Note (Signed)
On mevacor.  Low cholesterol diet and exercise.  Follow lipid panel and liver function tests.  

## 2020-04-07 NOTE — Assessment & Plan Note (Signed)
Upper symptoms controlled.  On nexium.  Follow.

## 2020-04-07 NOTE — Assessment & Plan Note (Signed)
Followed by urology.  On low dose imipramine and sanctura.  Follow.

## 2020-04-07 NOTE — Assessment & Plan Note (Signed)
Schedule carotid ultrasound.  

## 2020-04-07 NOTE — Assessment & Plan Note (Signed)
On mevacor.

## 2020-04-07 NOTE — Assessment & Plan Note (Signed)
Has been evaluated previously by neurology.  MRI without acute changes.  Discussed with her today.  She feels increased stress contributing.  Wants something to help level things out.  Follow.

## 2020-04-07 NOTE — Assessment & Plan Note (Signed)
On micardis.  Tolerating if takes in the evening.  Blood pressure better.  Follow pressures.  Follow metabolic panel.

## 2020-04-07 NOTE — Assessment & Plan Note (Signed)
Increased stress as outlined.  Discussed with her today.  Will start low dose lexapro 5mg  q day.  Follow.  Get her back in soon to reassess.

## 2020-04-20 ENCOUNTER — Other Ambulatory Visit: Payer: Self-pay

## 2020-04-20 ENCOUNTER — Ambulatory Visit (INDEPENDENT_AMBULATORY_CARE_PROVIDER_SITE_OTHER): Payer: Medicare PPO

## 2020-04-20 DIAGNOSIS — R0989 Other specified symptoms and signs involving the circulatory and respiratory systems: Secondary | ICD-10-CM

## 2020-05-07 ENCOUNTER — Other Ambulatory Visit: Payer: Self-pay | Admitting: Internal Medicine

## 2020-05-08 ENCOUNTER — Telehealth: Payer: Self-pay | Admitting: Physician Assistant

## 2020-05-08 NOTE — Telephone Encounter (Signed)
Patient states that over the past 3wks she has developed a rash which is now hives. She wonders if this could be a side effect of the Trospium. It was explained that she has been taking for 80mo and it would be unlikely to develop a rash after this amount of time typically if an allergic reaction were to take place it would be soon after starting a medication. Typical side effects of Trospium are dry mouth, dry eye and constipation. Patient states she does have these problems but her urinary symptoms are much better with the medication and she would like to continue and not have to stop. Patient then states the hives have been worsening with swelling and SOB. She was instructed to contact PCP, urgent care or acute care for further evaluation of possible allergic reaction.  She was told if unable to find another cause for reaction she should stop the trospium to see if symptoms resolve. Due to SOB she should still be evaluated further by PCP or urgent care. Patient verbalized understanding

## 2020-05-08 NOTE — Telephone Encounter (Signed)
Pt. Would like for someone to call her about side effects associated with this medication. She is not feeling well since starting this medication

## 2020-05-22 ENCOUNTER — Other Ambulatory Visit: Payer: Self-pay | Admitting: Internal Medicine

## 2020-05-22 ENCOUNTER — Telehealth: Payer: Self-pay | Admitting: Internal Medicine

## 2020-05-22 NOTE — Telephone Encounter (Signed)
Patient called in stated that she has blisters and they itch she thinks it is the new medication escitalopram (LEXAPRO) 5 MG tablet please call

## 2020-05-22 NOTE — Telephone Encounter (Signed)
Pt has small bumps on her upper thighs that itch x2 weeks. Has not spread and no other acute issues. Pt is going to stop lexapro and use hydrocortisone cream prn for the itching and f/u with Dr Nicki Reaper next week.

## 2020-05-29 ENCOUNTER — Ambulatory Visit (INDEPENDENT_AMBULATORY_CARE_PROVIDER_SITE_OTHER): Payer: Medicare PPO | Admitting: Internal Medicine

## 2020-05-29 ENCOUNTER — Other Ambulatory Visit: Payer: Self-pay

## 2020-05-29 ENCOUNTER — Encounter: Payer: Self-pay | Admitting: Internal Medicine

## 2020-05-29 DIAGNOSIS — I700 Atherosclerosis of aorta: Secondary | ICD-10-CM | POA: Diagnosis not present

## 2020-05-29 DIAGNOSIS — N3010 Interstitial cystitis (chronic) without hematuria: Secondary | ICD-10-CM | POA: Diagnosis not present

## 2020-05-29 DIAGNOSIS — F439 Reaction to severe stress, unspecified: Secondary | ICD-10-CM | POA: Diagnosis not present

## 2020-05-29 DIAGNOSIS — I10 Essential (primary) hypertension: Secondary | ICD-10-CM | POA: Diagnosis not present

## 2020-05-29 DIAGNOSIS — E7800 Pure hypercholesterolemia, unspecified: Secondary | ICD-10-CM

## 2020-05-29 MED ORDER — BUSPIRONE HCL 5 MG PO TABS: 5.0000 mg | ORAL_TABLET | Freq: Two times a day (BID) | ORAL | 1 refills | Status: DC

## 2020-05-29 MED ORDER — TELMISARTAN 20 MG PO TABS
20.0000 mg | ORAL_TABLET | Freq: Every day | ORAL | 1 refills | Status: DC
Start: 2020-05-29 — End: 2020-05-30

## 2020-05-29 NOTE — Progress Notes (Signed)
[name], female   DOB: Feb 29, 1941, 79 y.o.   MRN: [id number]   Subjective:    [name], female    DOB: [birth year], 79 y.o.   MRN: [id number]  HPI This visit occurred during the SARS-CoV-2 public health emergency.  Safety protocols were in place, including screening questions prior to the visit, additional usage of staff PPE, and extensive cleaning of exam room while observing appropriate contact time as indicated for disinfecting solutions.  Patient here for a scheduled follow up.  She last visit, discussed increased stress and anxiety.  Started on low dose lexapro.  She felt the lexapro was helping.  She recently developed a rash.  Isolated bumps - no diffuse rash.  Itch. She changed detergent and shampoo.  No change.  She was concerned lexapro was causing - so stopped the medication.  Still with individual lesions.  Has appt with dermatology later this month.  No chest pain or sob reported.  No abdominal pain or bowel change reported.     Past Medical History:  Diagnosis Date  . BCC (basal cell carcinoma of skin) 03/30/2007   R inf med pretibial - BCC  . BCC (basal cell carcinoma of skin) 10/12/2013   L nasal ala - BCC  . BCC (basal cell carcinoma of skin) 03/10/2018   L mid dorsum med forearm - superficial BCC   . Breast screening, unspecified 2013  . Cancer ([place]) 1992   skin  . Degenerative disk disease   . GERD (gastroesophageal reflux disease)   . Hypercholesterolemia 2008  . Interstitial cystitis    followed by Dr Jacqlyn Larsen  . Other sign and symptom in breast 2013   Left upper outer quadrant breast "soreness" Ultrasound exam of right breast in the 2 o'clock position with the breast distracted medially showed a 0.3-0.4 with 0.5 cm simple cyst. In the 1 o'clock position where pt reported tenderness US exam was negatiive.  Because of her history of intermittent nipple drainage, ultrasound was completed of the retroareolar area.  . Personal  history of tobacco use, presenting hazards to health   . PONV (postoperative nausea and vomiting)   . SCC (squamous cell carcinoma) 03/28/2008   R dorsum hand - SCC  . SCC (squamous cell carcinoma) 05/09/2009   R lat lower leg - SCCIS  . SCC (squamous cell carcinoma) 05/09/2009   L lat lower leg - SCCIS  . SCC (squamous cell carcinoma) 04/07/2013   L lower leg - SCCIS  . SCC (squamous cell carcinoma) 04/27/2013   R forearm - SCC  . SCC (squamous cell carcinoma) 04/28/2017   L lat knee - SCCIS  . SCC (squamous cell carcinoma) 05/12/2018   L prox dorsum forearm - SCC  . SCC (squamous cell carcinoma), leg, right 03/30/2007   R sup pretibial - SCCIS  . SCC (squamous cell carcinoma);BCC 10/12/2013   Mid back - superficial BCC with SCCIS   . Special screening for malignant neoplasms, colon    Past Surgical History:  Procedure Laterality Date  . ABDOMINAL HYSTERECTOMY  1992  . APPENDECTOMY    . CERVICAL DISCECTOMY     S/P C7-T1 discectomy with fusion  . CHOLECYSTECTOMY  1990  . COLONOSCOPY WITH PROPOFOL N/A 02/14/2016   Procedure: COLONOSCOPY WITH PROPOFOL;  Surgeon: Lucilla Lame, MD;  Location: [place];  Service: Endoscopy;  Laterality: N/A;  PT WOULD LIKE 10 ARRIVAL TIME OR LATER  . DILATION AND CURETTAGE OF UTERUS    .  ESOPHAGOGASTRODUODENOSCOPY (EGD) WITH PROPOFOL N/A 02/14/2016   Procedure: ESOPHAGOGASTRODUODENOSCOPY (EGD) WITH PROPOFOL with dialtion;  Surgeon: Lucilla Lame, MD;  Location: [place];  Service: Endoscopy;  Laterality: N/A;  . ESOPHAGOGASTRODUODENOSCOPY (EGD) WITH PROPOFOL N/A 04/13/2019   Procedure: ESOPHAGOGASTRODUODENOSCOPY (EGD) WITH PROPOFOL;  Surgeon: Toledo, Benay Pike, MD;  Location: [facility];  Service: Gastroenterology;  Laterality: N/A;  . MELANOMA EXCISION     removed from Left calf 1994   Family History  Problem Relation Age of Onset  . Hodgkin's lymphoma Mother   . Heart failure Father   . Heart attack Father   . Arthritis  Sister        Three sisters w/ degeneratve disk disease  . Headache Sister        Two sisters hx of headache  . Breast cancer Neg Hx   . Colon cancer Neg Hx   . Bladder Cancer Neg Hx   . Kidney cancer Neg Hx    Social History   Socioeconomic History  . Marital status: Widowed    Spouse name: Not on file  . Number of children: 2  . Years of education: Not on file  . Highest education level: Not on file  Occupational History  . Not on file  Tobacco Use  . Smoking status: Former Smoker    Packs/day: 1.00    Years: 15.00    Pack years: 15.00    Types: Cigarettes  . Smokeless tobacco: Never Used  Substance and Sexual Activity  . Alcohol use: No    Alcohol/week: 0.0 standard drinks  . Drug use: No  . Sexual activity: Not on file  Other Topics Concern  . Not on file  Social History Narrative   Married and has 2 children, daughters.   Social Determinants of Health   Financial Resource Strain:   . Difficulty of Paying Living Expenses: Not on file  Food Insecurity:   . Worried About Charity fundraiser in the Last Year: Not on file  . Ran Out of Food in the Last Year: Not on file  Transportation Needs:   . Lack of Transportation (Medical): Not on file  . Lack of Transportation (Non-Medical): Not on file  Physical Activity:   . Days of Exercise per Week: Not on file  . Minutes of Exercise per Session: Not on file  Stress:   . Feeling of Stress : Not on file  Social Connections:   . Frequency of Communication with Friends and Family: Not on file  . Frequency of Social Gatherings with Friends and Family: Not on file  . Attends Religious Services: Not on file  . Active Member of Clubs or Organizations: Not on file  . Attends Archivist Meetings: Not on file  . Marital Status: Not on file    Outpatient Encounter Medications as of 05/29/2020  Medication Sig  . acetaminophen (TYLENOL) 325 MG tablet Take 650 mg by mouth as needed.  Marland Kitchen esomeprazole (NEXIUM) 40 MG  capsule Take 1 capsule (40 mg total) by mouth daily.  . famotidine (PEPCID) 40 MG tablet Take 1 tablet (40 mg total) by mouth daily.  Marland Kitchen ibuprofen (ADVIL,MOTRIN) 200 MG tablet Take 200 mg by mouth every 6 (six) hours as needed.  Marland Kitchen imipramine (TOFRANIL) 10 MG tablet Take 1 tablet (10 mg total) by mouth at bedtime.  . lovastatin (MEVACOR) 20 MG tablet Take 1 tablet (20 mg total) by mouth at bedtime.  . Melatonin 10 MG CAPS Take by mouth as  needed.   . Multiple Vitamin (MULTI-VITAMIN DAILY PO) Take 1 tablet by mouth daily.  . mupirocin ointment (BACTROBAN) 2 % Apply to affected area bid  . ondansetron (ZOFRAN-ODT) 4 MG disintegrating tablet Take 1 tablet (4 mg total) by mouth every 8 (eight) hours as needed for nausea or vomiting.  . trospium (SANCTURA) 20 MG tablet Take 1 tablet (20 mg total) by mouth daily.  . [DISCONTINUED] escitalopram (LEXAPRO) 5 MG tablet Take 1 tablet by mouth once daily  . [DISCONTINUED] telmisartan (MICARDIS) 20 MG tablet Take 1 tablet by mouth once daily  . [DISCONTINUED] telmisartan (MICARDIS) 20 MG tablet Take 1 tablet (20 mg total) by mouth daily.  . busPIRone (BUSPAR) 5 MG tablet Take 1 tablet (5 mg total) by mouth 2 (two) times daily.   No facility-administered encounter medications on file as of 05/29/2020.    Review of Systems  Constitutional: Negative for appetite change and unexpected weight change.  HENT: Negative for congestion and sinus pressure.   Respiratory: Negative for cough, chest tightness and shortness of breath.   Cardiovascular: Negative for chest pain, palpitations and leg swelling.  Gastrointestinal: Negative for abdominal pain, diarrhea, nausea and vomiting.  Genitourinary: Negative for difficulty urinating and dysuria.  Musculoskeletal: Negative for joint swelling and myalgias.  Skin: Negative for color change.       Individual lesion as outlined.  No diffuse rash.    Neurological: Negative for dizziness, light-headedness and headaches.    Psychiatric/Behavioral: Negative for agitation and dysphoric mood.       Objective:    Physical Exam Constitutional:      General: She is not in acute distress.    Appearance: Normal appearance.  HENT:     Head: Normocephalic and atraumatic.     Right Ear: External ear normal.     Left Ear: External ear normal.  Eyes:     General: No scleral icterus.       Right eye: No discharge.        Left eye: No discharge.     Conjunctiva/sclera: Conjunctivae normal.  Neck:     Thyroid: No thyromegaly.  Cardiovascular:     Rate and Rhythm: Normal rate and regular rhythm.  Pulmonary:     Effort: No respiratory distress.     Breath sounds: Normal breath sounds. No wheezing.  Abdominal:     General: Bowel sounds are normal.     Palpations: Abdomen is soft.     Tenderness: There is no abdominal tenderness.  Musculoskeletal:        General: No swelling or tenderness.     Cervical back: Neck supple. No tenderness.  Lymphadenopathy:     Cervical: No cervical adenopathy.  Skin:    Findings: No erythema.     Comments: Individual lesions as outlined - no diffuse rash.  Does not appear to be c/w drug rash.    Neurological:     Mental Status: She is alert.  Psychiatric:        Mood and Affect: Mood normal.        Behavior: Behavior normal.     BP 136/72   Pulse 94   Temp 98.4 F (36.9 C) (Oral)   Resp 16   Ht 5\' 4"  (1.626 m)   Wt 146 lb 12.8 oz (66.6 kg)   LMP 08/09/2012   SpO2 97%   BMI 25.20 kg/m  Wt Readings from Last 3 Encounters:  05/29/20 146 lb 12.8 oz (66.6 kg)  04/06/20 148 lb (  67.1 kg)  03/08/20 147 lb 9.6 oz (67 kg)     Lab Results  Component Value Date   WBC 4.3 09/03/2019   HGB 12.9 09/03/2019   HCT 37.8 09/03/2019   PLT 172 09/03/2019   GLUCOSE 98 03/19/2020   CHOL 184 01/04/2020   TRIG 54.0 01/04/2020   HDL 83.00 01/04/2020   LDLCALC 90 01/04/2020   ALT 10 01/04/2020   AST 15 01/04/2020   NA 133 (L) 03/19/2020   K 4.7 03/19/2020   CL 100  03/19/2020   CREATININE 0.99 03/19/2020   BUN 7 03/19/2020   CO2 27 03/19/2020   TSH 3.13 06/07/2019   HGBA1C 5.8 06/07/2019    DG Bone Density  Result Date: 01/04/2020 EXAM: DUAL X-RAY ABSORPTIOMETRY (DXA) FOR BONE MINERAL DENSITY IMPRESSION: Your patient Tameia Rafferty completed a BMD test on 01/04/2020 using the [employer] (software version: 14.10) manufactured by [employer]. The following summarizes the results of our evaluation. Technologist: MTB PATIENT BIOGRAPHICAL: Name: Teyla, Skidgel Patient ID: [id number] Birth Date: 08/15/1941 Height:     64.0 in. Gender: Female Exam Date: 01/04/2020 Weight:     149.0 lbs. Indications: Caucasian, Hysterectomy, Oophorectomy Bilateral, Postmenopausal Fractures: Treatments: Multi-Vitamin, Nexium, Vitamin D DENSITOMETRY RESULTS: Site         Region     Measured Date Measured Age WHO Classification Young Adult T-score BMD         %Change vs. Previous Significant Change (*) DualFemur Neck Right 01/04/2020 78.9 Osteoporosis -2.7 0.669 g/cm2 - - DualFemur Total Mean 01/04/2020 79.9 Osteopenia -2.3 0.720 g/cm2 - - Left Forearm Radius 33% 01/04/2020 79.9 Osteoporosis -3.1 0.602 g/cm2 - - ASSESSMENT: The BMD measured at Forearm Radius 33% is 0.602 g/cm2 with a T-score of -3.1. This patient is considered OSTEOPOROTIC according to [facility]) criteria. The scan quality is good. Lumbar spine was not utilized due to advanced degenerative changes. World Pharmacologist [facility]) criteria for post-menopausal, Caucasian Women: Normal:                   T-score at or above -1 SD Osteopenia/low bone mass: T-score between -1 and -2.5 SD Osteoporosis:             T-score at or below -2.5 SD RECOMMENDATIONS: 1. All patients should optimize calcium and vitamin D intake. 2. Consider FDA-approved medical therapies in postmenopausal women and men aged 53 years and older, based on the following: a. A hip or vertebral(clinical or  morphometric) fracture b. T-score < -2.5 at the femoral neck or spine after appropriate evaluation to exclude secondary causes c. Low bone mass (T-score between -1.0 and -2.5 at the femoral neck or spine) and a 10-year probability of a hip fracture > 3% or a 10-year probability of a major osteoporosis-related fracture > 20% based on the [place]-adapted WHO algorithm 3. Clinician judgment and/or patient preferences may indicate treatment for people with 10-year fracture probabilities above or below these levels FOLLOW-UP: People with diagnosed cases of osteoporosis or at high risk for fracture should have regular bone mineral density tests. For patients eligible for Medicare, routine testing is allowed once every 2 years. The testing frequency can be increased to one year for patients who have rapidly progressing disease, those who are receiving or discontinuing medical therapy to restore bone mass, or have additional risk factors. I have reviewed this report, and agree with the above findings. Mark A. Thornton Papas, M.D. [facility] Electronically Signed  By: Lavonia Dana M.D.   On: 01/04/2020 12:22   MM 3D SCREEN BREAST BILATERAL  Result Date: 01/04/2020 CLINICAL DATA:  Screening. EXAM: DIGITAL SCREENING BILATERAL MAMMOGRAM WITH TOMO AND CAD COMPARISON:  Previous exam(s). ACR Breast Density Category b: There are scattered areas of fibroglandular density. FINDINGS: There are no findings suspicious for malignancy. Images were processed with CAD. IMPRESSION: No mammographic evidence of malignancy. A result letter of this screening mammogram will be mailed directly to the patient. RECOMMENDATION: Screening mammogram in one year. (Code:SM-B-01Y) BI-RADS CATEGORY  1: Negative. Electronically Signed   By: Lillia Mountain M.D.   On: 01/04/2020 12:38       Assessment & Plan:   Problem List Items Addressed This Visit    Stress    Increased stress as outlined.  Previously did not tolerate zoloft.  Did well with  lexapro, but she was concerned this "rash" was related to taking lexapro.  She has stopped. Has an appt with dermatology.  Will hold on restarting at this time.  Discussed other treatment options.  Start buspar bid.  Follow.       Hypercholesterolemia    On mevacor.  Low cholesterol diet and exercise.  Follow lipid panel and liver function tests.        Essential hypertension    Blood pressure as outlined. On micardis.  Follow pressures.  Follow metabolic panel.       Chronic interstitial cystitis    Followed by urology.       Aortic atherosclerosis (HCC)    Continue mevacor.           Einar Pheasant, MD

## 2020-05-30 ENCOUNTER — Other Ambulatory Visit: Payer: Self-pay | Admitting: Internal Medicine

## 2020-06-04 ENCOUNTER — Encounter: Payer: Self-pay | Admitting: Internal Medicine

## 2020-06-04 NOTE — Assessment & Plan Note (Signed)
Blood pressure as outlined. On micardis.  Follow pressures.  Follow metabolic panel.

## 2020-06-04 NOTE — Assessment & Plan Note (Signed)
Continue mevacor °

## 2020-06-04 NOTE — Assessment & Plan Note (Signed)
On mevacor.  Low cholesterol diet and exercise.  Follow lipid panel and liver function tests.  

## 2020-06-04 NOTE — Assessment & Plan Note (Signed)
Increased stress as outlined.  Previously did not tolerate zoloft.  Did well with lexapro, but she was concerned this "rash" was related to taking lexapro.  She has stopped. Has an appt with dermatology.  Will hold on restarting at this time.  Discussed other treatment options.  Start buspar bid.  Follow.

## 2020-06-04 NOTE — Assessment & Plan Note (Signed)
Followed by urology.   

## 2020-06-21 ENCOUNTER — Encounter: Payer: Self-pay | Admitting: Dermatology

## 2020-06-21 ENCOUNTER — Other Ambulatory Visit: Payer: Self-pay

## 2020-06-21 ENCOUNTER — Ambulatory Visit: Payer: Medicare PPO | Admitting: Dermatology

## 2020-06-21 DIAGNOSIS — D0462 Carcinoma in situ of skin of left upper limb, including shoulder: Secondary | ICD-10-CM | POA: Diagnosis not present

## 2020-06-21 DIAGNOSIS — L720 Epidermal cyst: Secondary | ICD-10-CM | POA: Diagnosis not present

## 2020-06-21 DIAGNOSIS — D1800 Hemangioma unspecified site: Secondary | ICD-10-CM | POA: Diagnosis not present

## 2020-06-21 DIAGNOSIS — D0472 Carcinoma in situ of skin of left lower limb, including hip: Secondary | ICD-10-CM

## 2020-06-21 DIAGNOSIS — Z85828 Personal history of other malignant neoplasm of skin: Secondary | ICD-10-CM | POA: Diagnosis not present

## 2020-06-21 DIAGNOSIS — C4491 Basal cell carcinoma of skin, unspecified: Secondary | ICD-10-CM

## 2020-06-21 DIAGNOSIS — C44519 Basal cell carcinoma of skin of other part of trunk: Secondary | ICD-10-CM | POA: Diagnosis not present

## 2020-06-21 DIAGNOSIS — L814 Other melanin hyperpigmentation: Secondary | ICD-10-CM

## 2020-06-21 DIAGNOSIS — C44719 Basal cell carcinoma of skin of left lower limb, including hip: Secondary | ICD-10-CM

## 2020-06-21 DIAGNOSIS — Z1283 Encounter for screening for malignant neoplasm of skin: Secondary | ICD-10-CM | POA: Diagnosis not present

## 2020-06-21 DIAGNOSIS — D2262 Melanocytic nevi of left upper limb, including shoulder: Secondary | ICD-10-CM

## 2020-06-21 DIAGNOSIS — C44619 Basal cell carcinoma of skin of left upper limb, including shoulder: Secondary | ICD-10-CM | POA: Diagnosis not present

## 2020-06-21 DIAGNOSIS — D485 Neoplasm of uncertain behavior of skin: Secondary | ICD-10-CM

## 2020-06-21 DIAGNOSIS — D229 Melanocytic nevi, unspecified: Secondary | ICD-10-CM | POA: Diagnosis not present

## 2020-06-21 DIAGNOSIS — L570 Actinic keratosis: Secondary | ICD-10-CM

## 2020-06-21 DIAGNOSIS — L578 Other skin changes due to chronic exposure to nonionizing radiation: Secondary | ICD-10-CM

## 2020-06-21 DIAGNOSIS — D099 Carcinoma in situ, unspecified: Secondary | ICD-10-CM

## 2020-06-21 DIAGNOSIS — L821 Other seborrheic keratosis: Secondary | ICD-10-CM

## 2020-06-21 HISTORY — DX: Carcinoma in situ, unspecified: D09.9

## 2020-06-21 HISTORY — DX: Basal cell carcinoma of skin, unspecified: C44.91

## 2020-06-21 NOTE — Progress Notes (Signed)
Follow-Up Visit   Subjective  Rachael Austin is a 79 y.o. female who presents for the following: Patient here for full body skin exam and skin cancer screening.  She has a history of BCC and SCC.  She has noticed some painful irregular lesions of the left foot and left leg that she is concerned about.  She would like these checked today.  The following portions of the chart were reviewed this encounter and updated as appropriate:  Tobacco  Allergies  Meds  Problems  Med Hx  Surg Hx  Fam Hx      Review of Systems:  No other skin or systemic complaints except as noted in HPI or Assessment and Plan.  Objective  Well appearing patient in no apparent distress; mood and affect are within normal limits.  A full examination was performed including scalp, head, eyes, ears, nose, lips, neck, chest, axillae, abdomen, back, buttocks, bilateral upper extremities, bilateral lower extremities, hands, feet, fingers, toes, fingernails, and toenails. All findings within normal limits unless otherwise noted below.  Objective  R cheek, Right Upper Back: Subcutaneous nodule.   Objective  L forearm x 2, L hand x 1, R shin x 1, R dorsal hand x 2, R upper back x 1, L dorsal foot x 2, L knee x 1, L shin x 1 (11): Erythematous thin papules/macules with gritty scale.   Objective  L dorsal forearm: 0.8 cm pink papule      Objective  L post shoulder sup: 1.0 cm pink plaque     Objective  L post shoulder inferior: 0.5 cm erythematous tan papule      Objective  L mid chest: 1.0 cm thin pink plaque      Objective  L lat thigh: 0.9 cm pink papule (3.0 mm punch)     Objective  L lat calf: 0.6 cm thick scaly pink papule      Assessment & Plan  Epidermal inclusion cyst (2) R cheek; Right Upper Back  Benign-appearing.  Observation.  Call clinic for new or changing lesions.  Recommend daily use of broad spectrum spf 30+ sunscreen to sun-exposed areas.  AK (actinic  keratosis) (11) L forearm x 2, L hand x 1, R shin x 1, R dorsal hand x 2, R upper back x 1, L dorsal foot x 2, L knee x 1, L shin x 1  Multiple hypertrophic AK's on the arms and hands   Hypertrophic AK Right shin treated with Ln2 today - consider biopsy if not resolved  Destruction of lesion - L forearm x 2, L hand x 1, R shin x 1, R dorsal hand x 2, R upper back x 1, L dorsal foot x 2, L knee x 1, L shin x 1  Destruction method: cryotherapy   Informed consent: discussed and consent obtained   Lesion destroyed using liquid nitrogen: Yes   Region frozen until ice ball extended beyond lesion: Yes   Outcome: patient tolerated procedure well with no complications   Post-procedure details: wound care instructions given    Neoplasm of uncertain behavior of skin (6) L dorsal forearm  Skin / nail biopsy Type of biopsy: tangential   Informed consent: discussed and consent obtained   Anesthesia: the lesion was anesthetized in a standard fashion   Anesthesia comment:  Area prepped with alcohol Anesthetic:  1% lidocaine w/ epinephrine 1-100,000 buffered w/ 8.4% NaHCO3 Instrument used: flexible razor blade   Hemostasis achieved with: pressure and aluminum chloride  Outcome: patient tolerated procedure well   Post-procedure details: wound care instructions given   Post-procedure details comment:  Ointment and small bandage applied  Specimen 1 - Surgical pathology Differential Diagnosis: D48.5 r/o BCC  Check Margins: No 0.8 cm pink papule  L post shoulder sup  Skin / nail biopsy Type of biopsy: tangential   Informed consent: discussed and consent obtained   Anesthesia: the lesion was anesthetized in a standard fashion   Anesthesia comment:  Area prepped with alcohol Anesthetic:  1% lidocaine w/ epinephrine 1-100,000 buffered w/ 8.4% NaHCO3 Instrument used: flexible razor blade   Hemostasis achieved with: pressure and aluminum chloride   Outcome: patient tolerated procedure well    Post-procedure details: wound care instructions given   Post-procedure details comment:  Ointment and small bandage applied  Specimen 2 - Surgical pathology Differential Diagnosis: D48.5 r/o BCC  Check Margins: No 1.0 cm pink plaque  L post shoulder inferior  Epidermal / dermal shaving  Lesion diameter (cm):  0.5 Informed consent: discussed and consent obtained   Patient was prepped and draped in usual sterile fashion: area prepped with alcohol. Anesthesia: the lesion was anesthetized in a standard fashion   Anesthetic:  1% lidocaine w/ epinephrine 1-100,000 buffered w/ 8.4% NaHCO3 Instrument used: flexible razor blade   Hemostasis achieved with: pressure, aluminum chloride and electrodesiccation   Outcome: patient tolerated procedure well   Post-procedure details: wound care instructions given   Post-procedure details comment:  Ointment and small bandage applied  Specimen 3 - Surgical pathology Differential Diagnosis: D48.5 irritated nevus r/o dysplasia Check Margins: No 0.5 cm erythematous tan papule  L mid chest  Skin / nail biopsy Type of biopsy: tangential   Informed consent: discussed and consent obtained   Anesthesia: the lesion was anesthetized in a standard fashion   Anesthesia comment:  Area prepped with alcohol Anesthetic:  1% lidocaine w/ epinephrine 1-100,000 buffered w/ 8.4% NaHCO3 Instrument used: flexible razor blade   Hemostasis achieved with: pressure and aluminum chloride   Outcome: patient tolerated procedure well   Post-procedure details: wound care instructions given   Post-procedure details comment:  Ointment and small bandage applied  Specimen 4 - Surgical pathology Differential Diagnosis: D48.5 r/o BCC  Check Margins: No 1.0 cm thin pink plaque  L lat thigh  Skin / nail biopsy Type of biopsy: punch   Informed consent: discussed and consent obtained   Patient was prepped and draped in usual sterile fashion: area prepped with  alcohol. Anesthesia: the lesion was anesthetized in a standard fashion   Anesthetic:  1% lidocaine w/ epinephrine 1-100,000 buffered w/ 8.4% NaHCO3 Punch size:  3 mm Hemostasis achieved with: suture, pressure and Gelfoam   Outcome: patient tolerated procedure well   Post-procedure details: wound care instructions given   Post-procedure details comment:  Ointment and small bandage applied  Specimen 5 - Surgical pathology Differential Diagnosis: D48.5  amelanotic MM vs BCC vs other  Check Margins: No 0.9 cm pink papule   L lat calf  Skin / nail biopsy Type of biopsy: tangential   Informed consent: discussed and consent obtained   Anesthesia: the lesion was anesthetized in a standard fashion   Anesthesia comment:  Area prepped with alcohol Anesthetic:  1% lidocaine w/ epinephrine 1-100,000 buffered w/ 8.4% NaHCO3 Instrument used: flexible razor blade   Hemostasis achieved with: pressure and aluminum chloride   Outcome: patient tolerated procedure well   Post-procedure details: wound care instructions given   Post-procedure details comment:  Ointment and  small bandage applied  Specimen 6 - Surgical pathology Differential Diagnosis: D48.5 r/o SCC  Check Margins: No 0.6 cm thick scaly pink papule  Start Mupirocin 2% ointment - apply to aa's QD until healed   Other Related Medications mupirocin ointment (BACTROBAN) 2 %   Lentigines - Scattered tan macules - Discussed due to sun exposure - Benign, observe - Call for any changes - Seborrheic Keratoses - Stuck-on, waxy, tan-brown papules and plaques  - Discussed benign etiology and prognosis. - Observe - Call for any changes  Melanocytic Nevi - Tan-brown and/or pink-flesh-colored symmetric macules and papules - Benign appearing on exam today - Observation - Call clinic for new or changing moles - Recommend daily use of broad spectrum spf 30+ sunscreen to sun-exposed areas.   Hemangiomas - Red papules - Discussed  benign nature - Observe - Call for any changes  Actinic Damage - Severe - diffuse scaly erythematous papules affecting face, hands, arms with underlying dyspigmentation - Discussed recommendation of field therapy with PDT or a topical chemotherapy cream (discussed 5-FU/calcipotriene as well as imiquimod) - Patient prefers to discuss treatment with her daughter before deciding on how to treat  - Recommend daily broad spectrum sunscreen SPF 30+ to sun-exposed areas, reapply every 2 hours as needed.  - Call for new or changing lesions.  Skin cancer screening performed today.  Return in about 2 months (around 08/21/2020).  Luther Redo, CMA, am acting as scribe for Forest Gleason, MD .  Documentation: I have reviewed the above documentation for accuracy and completeness, and I agree with the above.  Forest Gleason, MD

## 2020-06-21 NOTE — Patient Instructions (Signed)

## 2020-06-26 ENCOUNTER — Encounter: Payer: Self-pay | Admitting: Dermatology

## 2020-06-28 ENCOUNTER — Telehealth: Payer: Self-pay

## 2020-06-28 ENCOUNTER — Telehealth: Payer: Self-pay | Admitting: Internal Medicine

## 2020-06-28 ENCOUNTER — Telehealth: Payer: Self-pay | Admitting: Dermatology

## 2020-06-28 MED ORDER — TELMISARTAN 20 MG PO TABS
20.0000 mg | ORAL_TABLET | Freq: Every day | ORAL | 1 refills | Status: DC
Start: 2020-06-28 — End: 2020-07-27

## 2020-06-28 NOTE — Telephone Encounter (Signed)
Pathology report showed  1. Skin , left dorsal forearm SQUAMOUS CELL CARCINOMA IN SITU --> ED&C  2. Skin , left post shoulder sup BASAL CELL CARCINOMA, NODULAR PATTERN --> ED&C  3. Skin , left post shoulder inferior MELANOCYTIC NEVUS, INTRADERMAL TYPE, IRRITATED --> No treatment needed  4. Skin , left mid chest SUPERFICIAL AND NODULAR BASAL CELL CARCINOMA --> ED&C  5. Skin , left lat thigh BASAL CELL CARCINOMA WITH FOCAL SCLEROSIS --> Will discuss ED&C vs Excision given sclerosis  6. Skin , left lat calf SQUAMOUS CELL CARCINOMA IN SITU, HYPERTROPHIC, BASE INVOLVED --> ED&C  I discussed the results with the patient as well as recommended treatment and the treatment options for site #5.  She would like to consider whether to do an ED&C versus excision for site #5.  We will go ahead and scheduled for an [facility] of 3 other sites and she can let us know what she prefers at that visit or sooner if she makes a decision before then.  MAs please call to schedule for a [facility] appointment. Thank you!

## 2020-06-28 NOTE — Telephone Encounter (Signed)
Will plan ED&C x 5 on patient's follow up appt 08/29/20, JS

## 2020-06-28 NOTE — Addendum Note (Signed)
Addended by: Elpidio Galea T on: 06/28/2020 08:52 AM   Modules accepted: Orders

## 2020-06-28 NOTE — Telephone Encounter (Signed)
Pt needs a refill on telmisartan (MICARDIS) 20 MG tablet sent to [facility] pt is out of medication

## 2020-07-11 ENCOUNTER — Ambulatory Visit: Payer: Medicare PPO | Admitting: Internal Medicine

## 2020-07-11 ENCOUNTER — Encounter: Payer: Self-pay | Admitting: Internal Medicine

## 2020-07-11 ENCOUNTER — Other Ambulatory Visit: Payer: Self-pay

## 2020-07-11 VITALS — BP 136/84 | HR 87 | Temp 98.1°F | Resp 16 | Ht 64.0 in | Wt 149.0 lb

## 2020-07-11 DIAGNOSIS — I10 Essential (primary) hypertension: Secondary | ICD-10-CM

## 2020-07-11 DIAGNOSIS — E7800 Pure hypercholesterolemia, unspecified: Secondary | ICD-10-CM

## 2020-07-11 DIAGNOSIS — K219 Gastro-esophageal reflux disease without esophagitis: Secondary | ICD-10-CM | POA: Diagnosis not present

## 2020-07-11 DIAGNOSIS — F439 Reaction to severe stress, unspecified: Secondary | ICD-10-CM

## 2020-07-11 DIAGNOSIS — Z23 Encounter for immunization: Secondary | ICD-10-CM | POA: Diagnosis not present

## 2020-07-11 DIAGNOSIS — I700 Atherosclerosis of aorta: Secondary | ICD-10-CM | POA: Diagnosis not present

## 2020-07-11 NOTE — Progress Notes (Signed)
[name], female   DOB: 07-23-1941, 79 y.o.   MRN: [id number]   Subjective:    [name], female    DOB: 12/03/1940, 79 y.o.   MRN: [id number]  HPI This visit occurred during the SARS-CoV-2 public health emergency.  Safety protocols were in place, including screening questions prior to the visit, additional usage of staff PPE, and extensive cleaning of exam room while observing appropriate contact time as indicated for disinfecting solutions.  Patient here for a scheduled follow up.  Here to follow up regarding increased stress.  Started on buspar.  Taking bid.  Feel is helping.  Feels better.  Trying to stay active.  No chest pain or sob reported.  No abdominal pain or bowel change reported.  Blood pressure better.   Past Medical History:  Diagnosis Date  . Basal cell carcinoma 06/21/2020   left post shoulder sup, left mid chest, left lat thigh  . BCC (basal cell carcinoma of skin) 03/30/2007   R inf med pretibial - BCC  . BCC (basal cell carcinoma of skin) 10/12/2013   L nasal ala - BCC  . BCC (basal cell carcinoma of skin) 03/10/2018   L mid dorsum med forearm - superficial BCC   . Breast screening, unspecified 2013  . Cancer ([place]) 1992   skin  . Degenerative disk disease   . GERD (gastroesophageal reflux disease)   . Hypercholesterolemia 2008  . Interstitial cystitis    followed by Dr Jacqlyn Larsen  . Other sign and symptom in breast 2013   Left upper outer quadrant breast "soreness" Ultrasound exam of right breast in the 2 o'clock position with the breast distracted medially showed a 0.3-0.4 with 0.5 cm simple cyst. In the 1 o'clock position where pt reported tenderness US exam was negatiive.  Because of her history of intermittent nipple drainage, ultrasound was completed of the retroareolar area.  . Personal history of tobacco use, presenting hazards to health   . PONV (postoperative nausea and vomiting)   . SCC (squamous cell carcinoma)  03/28/2008   R dorsum hand - SCC  . SCC (squamous cell carcinoma) 05/09/2009   R lat lower leg - SCCIS  . SCC (squamous cell carcinoma) 05/09/2009   L lat lower leg - SCCIS  . SCC (squamous cell carcinoma) 04/07/2013   L lower leg - SCCIS  . SCC (squamous cell carcinoma) 04/27/2013   R forearm - SCC  . SCC (squamous cell carcinoma) 04/28/2017   L lat knee - SCCIS  . SCC (squamous cell carcinoma) 05/12/2018   L prox dorsum forearm - SCC  . SCC (squamous cell carcinoma), leg, right 03/30/2007   R sup pretibial - SCCIS  . SCC (squamous cell carcinoma);BCC 10/12/2013   Mid back - superficial BCC with SCCIS   . Special screening for malignant neoplasms, colon   . 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   left dorsal forearm, left lat calf   Past Surgical History:  Procedure Laterality Date  . ABDOMINAL HYSTERECTOMY  1992  . APPENDECTOMY    . CERVICAL DISCECTOMY     S/P C7-T1 discectomy with fusion  . CHOLECYSTECTOMY  1990  . COLONOSCOPY WITH PROPOFOL N/A 02/14/2016   Procedure: COLONOSCOPY WITH PROPOFOL;  Surgeon: Lucilla Lame, MD;  Location: [place];  Service: Endoscopy;  Laterality: N/A;  PT WOULD LIKE 10 ARRIVAL TIME OR LATER  . DILATION AND CURETTAGE OF UTERUS    . ESOPHAGOGASTRODUODENOSCOPY (EGD) WITH PROPOFOL N/A 02/14/2016  Procedure: ESOPHAGOGASTRODUODENOSCOPY (EGD) WITH PROPOFOL with dialtion;  Surgeon: Lucilla Lame, MD;  Location: [place];  Service: Endoscopy;  Laterality: N/A;  . ESOPHAGOGASTRODUODENOSCOPY (EGD) WITH PROPOFOL N/A 04/13/2019   Procedure: ESOPHAGOGASTRODUODENOSCOPY (EGD) WITH PROPOFOL;  Surgeon: Toledo, Benay Pike, MD;  Location: [facility];  Service: Gastroenterology;  Laterality: N/A;  . MELANOMA EXCISION     removed from Left calf 1994   Family History  Problem Relation Age of Onset  . Hodgkin's lymphoma Mother   . Heart failure Father   . Heart attack Father   . Arthritis Sister        Three sisters w/ degeneratve disk  disease  . Headache Sister        Two sisters hx of headache  . Breast cancer Neg Hx   . Colon cancer Neg Hx   . Bladder Cancer Neg Hx   . Kidney cancer Neg Hx    Social History   Socioeconomic History  . Marital status: Widowed    Spouse name: Not on file  . Number of children: 2  . Years of education: Not on file  . Highest education level: Not on file  Occupational History  . Not on file  Tobacco Use  . Smoking status: Former Smoker    Packs/day: 1.00    Years: 15.00    Pack years: 15.00    Types: Cigarettes  . Smokeless tobacco: Never Used  Substance and Sexual Activity  . Alcohol use: No    Alcohol/week: 0.0 standard drinks  . Drug use: No  . Sexual activity: Not on file  Other Topics Concern  . Not on file  Social History Narrative   Married and has 2 children, daughters.   Social Determinants of Health   Financial Resource Strain:   . Difficulty of Paying Living Expenses: Not on file  Food Insecurity:   . Worried About Charity fundraiser in the Last Year: Not on file  . Ran Out of Food in the Last Year: Not on file  Transportation Needs:   . Lack of Transportation (Medical): Not on file  . Lack of Transportation (Non-Medical): Not on file  Physical Activity:   . Days of Exercise per Week: Not on file  . Minutes of Exercise per Session: Not on file  Stress:   . Feeling of Stress : Not on file  Social Connections:   . Frequency of Communication with Friends and Family: Not on file  . Frequency of Social Gatherings with Friends and Family: Not on file  . Attends Religious Services: Not on file  . Active Member of Clubs or Organizations: Not on file  . Attends Archivist Meetings: Not on file  . Marital Status: Not on file    Outpatient Encounter Medications as of 07/11/2020  Medication Sig  . acetaminophen (TYLENOL) 325 MG tablet Take 650 mg by mouth as needed.  . busPIRone (BUSPAR) 5 MG tablet Take 1 tablet (5 mg total) by mouth 2 (two)  times daily.  Marland Kitchen esomeprazole (NEXIUM) 40 MG capsule Take 1 capsule (40 mg total) by mouth daily.  . famotidine (PEPCID) 40 MG tablet Take 1 tablet (40 mg total) by mouth daily.  Marland Kitchen ibuprofen (ADVIL,MOTRIN) 200 MG tablet Take 200 mg by mouth every 6 (six) hours as needed.  Marland Kitchen imipramine (TOFRANIL) 10 MG tablet Take 1 tablet (10 mg total) by mouth at bedtime.  . lovastatin (MEVACOR) 20 MG tablet Take 1 tablet (20 mg total) by mouth at  bedtime.  . Melatonin 10 MG CAPS Take by mouth as needed.   . Multiple Vitamin (MULTI-VITAMIN DAILY PO) Take 1 tablet by mouth daily.  . mupirocin ointment (BACTROBAN) 2 % Apply to affected area bid  . ondansetron (ZOFRAN-ODT) 4 MG disintegrating tablet Take 1 tablet (4 mg total) by mouth every 8 (eight) hours as needed for nausea or vomiting.  Marland Kitchen telmisartan (MICARDIS) 20 MG tablet Take 1 tablet (20 mg total) by mouth daily.  . trospium (SANCTURA) 20 MG tablet Take 1 tablet (20 mg total) by mouth daily.   No facility-administered encounter medications on file as of 07/11/2020.    Review of Systems  Constitutional: Negative for appetite change and unexpected weight change.  HENT: Negative for congestion and sinus pressure.   Respiratory: Negative for cough, chest tightness and shortness of breath.   Cardiovascular: Negative for chest pain, palpitations and leg swelling.  Gastrointestinal: Negative for abdominal pain, diarrhea, nausea and vomiting.  Genitourinary: Negative for difficulty urinating and dysuria.  Musculoskeletal: Negative for joint swelling and myalgias.  Skin: Negative for color change and rash.  Neurological: Negative for dizziness, light-headedness and headaches.  Psychiatric/Behavioral: Negative for agitation and dysphoric mood.       Objective:    Physical Exam Vitals reviewed.  Constitutional:      General: She is not in acute distress.    Appearance: Normal appearance.  HENT:     Head: Normocephalic and atraumatic.     Right Ear:  External ear normal.     Left Ear: External ear normal.  Eyes:     General: No scleral icterus.       Right eye: No discharge.        Left eye: No discharge.     Conjunctiva/sclera: Conjunctivae normal.  Neck:     Thyroid: No thyromegaly.  Cardiovascular:     Rate and Rhythm: Normal rate and regular rhythm.  Pulmonary:     Effort: No respiratory distress.     Breath sounds: Normal breath sounds. No wheezing.  Abdominal:     General: Bowel sounds are normal.     Palpations: Abdomen is soft.     Tenderness: There is no abdominal tenderness.  Musculoskeletal:        General: No swelling or tenderness.     Cervical back: Neck supple. No tenderness.  Lymphadenopathy:     Cervical: No cervical adenopathy.  Skin:    Findings: No erythema or rash.  Neurological:     Mental Status: She is alert.  Psychiatric:        Mood and Affect: Mood normal.        Behavior: Behavior normal.     BP 136/84   Pulse 87   Temp 98.1 F (36.7 C) (Oral)   Resp 16   Ht 5\' 4"  (1.626 m)   Wt 149 lb (67.6 kg)   LMP 08/09/2012   SpO2 98%   BMI 25.58 kg/m  Wt Readings from Last 3 Encounters:  07/11/20 149 lb (67.6 kg)  05/29/20 146 lb 12.8 oz (66.6 kg)  04/06/20 148 lb (67.1 kg)     Lab Results  Component Value Date   WBC 4.3 09/03/2019   HGB 12.9 09/03/2019   HCT 37.8 09/03/2019   PLT 172 09/03/2019   GLUCOSE 98 03/19/2020   CHOL 184 01/04/2020   TRIG 54.0 01/04/2020   HDL 83.00 01/04/2020   LDLCALC 90 01/04/2020   ALT 10 01/04/2020   AST 15 01/04/2020  NA 133 (L) 03/19/2020   K 4.7 03/19/2020   CL 100 03/19/2020   CREATININE 0.99 03/19/2020   BUN 7 03/19/2020   CO2 27 03/19/2020   TSH 3.13 06/07/2019   HGBA1C 5.8 06/07/2019    DG Bone Density  Result Date: 01/04/2020 EXAM: DUAL X-RAY ABSORPTIOMETRY (DXA) FOR BONE MINERAL DENSITY IMPRESSION: Your patient Rhandi Despain completed a BMD test on 01/04/2020 using the [place] [employer] (software version: 14.10) manufactured  by [employer]. The following summarizes the results of our evaluation. Technologist: MTB PATIENT BIOGRAPHICAL: Name: Euva, Rundell Patient ID: [id number] Birth Date: 1941-08-20 Height:     64.0 in. Gender: Female Exam Date: 01/04/2020 Weight:     149.0 lbs. Indications: Caucasian, Hysterectomy, Oophorectomy Bilateral, Postmenopausal Fractures: Treatments: Multi-Vitamin, Nexium, Vitamin D DENSITOMETRY RESULTS: Site         Region     Measured Date Measured Age WHO Classification Young Adult T-score BMD         %Change vs. Previous Significant Change (*) DualFemur Neck Right 01/04/2020 79.9 Osteoporosis -2.7 0.669 g/cm2 - - DualFemur Total Mean 01/04/2020 79.9 Osteopenia -2.3 0.720 g/cm2 - - Left Forearm Radius 33% 01/04/2020 79.9 Osteoporosis -3.1 0.602 g/cm2 - - ASSESSMENT: The BMD measured at Forearm Radius 33% is 0.602 g/cm2 with a T-score of -3.1. This patient is considered OSTEOPOROTIC according to [facility]) criteria. The scan quality is good. Lumbar spine was not utilized due to advanced degenerative changes. World Pharmacologist [facility]) criteria for post-menopausal, Caucasian Women: Normal:                   T-score at or above -1 SD Osteopenia/low bone mass: T-score between -1 and -2.5 SD Osteoporosis:             T-score at or below -2.5 SD RECOMMENDATIONS: 1. All patients should optimize calcium and vitamin D intake. 2. Consider FDA-approved medical therapies in postmenopausal women and men aged 70 years and older, based on the following: a. A hip or vertebral(clinical or morphometric) fracture b. T-score < -2.5 at the femoral neck or spine after appropriate evaluation to exclude secondary causes c. Low bone mass (T-score between -1.0 and -2.5 at the femoral neck or spine) and a 10-year probability of a hip fracture > 3% or a 10-year probability of a major osteoporosis-related fracture > 20% based on the [place]-adapted WHO algorithm 3. Clinician judgment and/or  patient preferences may indicate treatment for people with 10-year fracture probabilities above or below these levels FOLLOW-UP: People with diagnosed cases of osteoporosis or at high risk for fracture should have regular bone mineral density tests. For patients eligible for Medicare, routine testing is allowed once every 2 years. The testing frequency can be increased to one year for patients who have rapidly progressing disease, those who are receiving or discontinuing medical therapy to restore bone mass, or have additional risk factors. I have reviewed this report, and agree with the above findings. Mark A. Thornton Papas, M.D. [facility] Electronically Signed   By: Lavonia Dana M.D.   On: 01/04/2020 12:22   MM 3D SCREEN BREAST BILATERAL  Result Date: 01/04/2020 CLINICAL DATA:  Screening. EXAM: DIGITAL SCREENING BILATERAL MAMMOGRAM WITH TOMO AND CAD COMPARISON:  Previous exam(s). ACR Breast Density Category b: There are scattered areas of fibroglandular density. FINDINGS: There are no findings suspicious for malignancy. Images were processed with CAD. IMPRESSION: No mammographic evidence of malignancy. A result letter of this screening mammogram will  be mailed directly to the patient. RECOMMENDATION: Screening mammogram in one year. (Code:SM-B-01Y) BI-RADS CATEGORY  1: Negative. Electronically Signed   By: Lillia Mountain M.D.   On: 01/04/2020 12:38       Assessment & Plan:   Problem List Items Addressed This Visit    Stress    Increased stress.  See previous note.  On buspar bid.  Doing well on the medication.  Feels better.  Follow.       Hypercholesterolemia    On mevacor.  Low cholesterol diet and exercise.  Follow lipid panel and liver function tests.       Relevant Orders   Hepatic function panel   Lipid panel   GERD (gastroesophageal reflux disease)    No upper symptoms reported. On nexium.  Follow.       Essential hypertension    On micardis.  Blood pressure as outlined.   Follow pressures.  Follow metabolic panel.       Relevant Orders   CBC with Differential/Platelet   TSH   Basic metabolic panel   Aortic atherosclerosis (HCC)    Continue mevacor.  Follow lipid panel and liver function tests.         Other Visit Diagnoses    Need for immunization against influenza    -  Primary   Relevant Orders   Flu Vaccine QUAD High Dose(Fluad) (Completed)       Einar Pheasant, MD

## 2020-07-15 ENCOUNTER — Telehealth: Payer: Self-pay | Admitting: Internal Medicine

## 2020-07-15 ENCOUNTER — Encounter: Payer: Self-pay | Admitting: Internal Medicine

## 2020-07-15 NOTE — Assessment & Plan Note (Signed)
On mevacor.  Low cholesterol diet and exercise.  Follow lipid panel and liver function tests.

## 2020-07-15 NOTE — Assessment & Plan Note (Signed)
No upper symptoms reported. On nexium.  Follow.

## 2020-07-15 NOTE — Assessment & Plan Note (Signed)
On micardis.  Blood pressure as outlined.  Follow pressures.  Follow metabolic panel.

## 2020-07-15 NOTE — Assessment & Plan Note (Signed)
Continue mevacor.  Follow lipid panel and liver function tests.

## 2020-07-15 NOTE — Telephone Encounter (Signed)
Pt states had pneumonia vaccine at [facility] - she thinks.  Need documentation if had pneumonia vaccine.

## 2020-07-15 NOTE — Assessment & Plan Note (Signed)
Increased stress.  See previous note.  On buspar bid.  Doing well on the medication.  Feels better.  Follow.

## 2020-07-18 ENCOUNTER — Other Ambulatory Visit (INDEPENDENT_AMBULATORY_CARE_PROVIDER_SITE_OTHER): Payer: Medicare PPO

## 2020-07-18 ENCOUNTER — Other Ambulatory Visit: Payer: Self-pay

## 2020-07-18 DIAGNOSIS — I10 Essential (primary) hypertension: Secondary | ICD-10-CM

## 2020-07-18 DIAGNOSIS — E7800 Pure hypercholesterolemia, unspecified: Secondary | ICD-10-CM | POA: Diagnosis not present

## 2020-07-18 LAB — CBC WITH DIFFERENTIAL/PLATELET
Basophils Absolute: 0.1 10*3/uL (ref 0.0–0.1)
Basophils Relative: 1.1 % (ref 0.0–3.0)
Eosinophils Absolute: 0 10*3/uL (ref 0.0–0.7)
Eosinophils Relative: 1 % (ref 0.0–5.0)
HCT: 38.8 % (ref 36.0–46.0)
Hemoglobin: 13.1 g/dL (ref 12.0–15.0)
Lymphocytes Relative: 28.1 % (ref 12.0–46.0)
Lymphs Abs: 1.3 10*3/uL (ref 0.7–4.0)
MCHC: 33.8 g/dL (ref 30.0–36.0)
MCV: 89.1 fl (ref 78.0–100.0)
Monocytes Absolute: 0.5 10*3/uL (ref 0.1–1.0)
Monocytes Relative: 10.5 % (ref 3.0–12.0)
Neutro Abs: 2.8 10*3/uL (ref 1.4–7.7)
Neutrophils Relative %: 59.3 % (ref 43.0–77.0)
Platelets: 241 10*3/uL (ref 150.0–400.0)
RBC: 4.36 Mil/uL (ref 3.87–5.11)
RDW: 14.4 % (ref 11.5–15.5)
WBC: 4.7 10*3/uL (ref 4.0–10.5)

## 2020-07-18 LAB — BASIC METABOLIC PANEL
BUN: 6 mg/dL (ref 6–23)
CO2: 31 mEq/L (ref 19–32)
Calcium: 9.3 mg/dL (ref 8.4–10.5)
Chloride: 102 mEq/L (ref 96–112)
Creatinine, Ser: 0.96 mg/dL (ref 0.40–1.20)
GFR: 55.65 mL/min — ABNORMAL LOW (ref >60.00)
Glucose, Bld: 93 mg/dL (ref 70–99)
Potassium: 4.4 mEq/L (ref 3.5–5.1)
Sodium: 138 mEq/L (ref 135–145)

## 2020-07-18 LAB — LIPID PANEL
Cholesterol: 183 mg/dL (ref 0–200)
HDL: 77.3 mg/dL (ref >39.00)
LDL Cholesterol: 93 mg/dL (ref 0–99)
NonHDL: 105.72
Total CHOL/HDL Ratio: 2
Triglycerides: 66 mg/dL (ref 0.0–149.0)
VLDL: 13.2 mg/dL (ref 0.0–40.0)

## 2020-07-18 LAB — HEPATIC FUNCTION PANEL
ALT: 11 U/L (ref 0–35)
AST: 16 U/L (ref 0–37)
Albumin: 4.2 g/dL (ref 3.5–5.2)
Alkaline Phosphatase: 79 U/L (ref 39–117)
Bilirubin, Direct: 0.1 mg/dL (ref 0.0–0.3)
Total Bilirubin: 0.7 mg/dL (ref 0.2–1.2)
Total Protein: 6 g/dL (ref 6.0–8.3)

## 2020-07-18 LAB — TSH: TSH: 4.65 u[IU]/mL — ABNORMAL HIGH (ref 0.35–4.50)

## 2020-07-19 ENCOUNTER — Other Ambulatory Visit: Payer: Self-pay | Admitting: Internal Medicine

## 2020-07-19 DIAGNOSIS — R7989 Other specified abnormal findings of blood chemistry: Secondary | ICD-10-CM

## 2020-07-19 NOTE — Progress Notes (Signed)
Order placed for f/u tsh.  

## 2020-07-20 NOTE — Telephone Encounter (Signed)
Attempted to call [facility] on [address] twice at 2:35 pm and 4:20pm and the phone continued to ring without being answered by a pharmacist. Could not leave a message.

## 2020-07-20 NOTE — Telephone Encounter (Signed)
Called and spoke to the patient to ask about the date of the pneumonia vaccine. Rachael Austin states that she doesn't know when she had it, nor where she had it at. [name] states that it could have been at [facility] on [address] or at [facility] on [address].

## 2020-07-27 ENCOUNTER — Other Ambulatory Visit: Payer: Self-pay | Admitting: Internal Medicine

## 2020-07-27 ENCOUNTER — Other Ambulatory Visit: Payer: Self-pay

## 2020-07-27 MED ORDER — TELMISARTAN 20 MG PO TABS
20.0000 mg | ORAL_TABLET | Freq: Every day | ORAL | 1 refills | Status: DC
Start: 2020-07-27 — End: 2020-08-30

## 2020-07-27 NOTE — Telephone Encounter (Signed)
Pt said she needs refill on telmisartan and buspirone. She said that Dr. Nicki Reaper was supposed to put this in the last time she was here on 07/11/20. She will be out by tomorrow.

## 2020-08-07 NOTE — Telephone Encounter (Signed)
Per pharmacist at [facility]- patient has not had PNA vaccines with them

## 2020-08-29 ENCOUNTER — Other Ambulatory Visit: Payer: Self-pay | Admitting: Internal Medicine

## 2020-08-29 ENCOUNTER — Other Ambulatory Visit: Payer: Self-pay

## 2020-08-29 ENCOUNTER — Ambulatory Visit: Payer: Medicare PPO | Admitting: Dermatology

## 2020-08-29 DIAGNOSIS — D0472 Carcinoma in situ of skin of left lower limb, including hip: Secondary | ICD-10-CM

## 2020-08-29 DIAGNOSIS — D0462 Carcinoma in situ of skin of left upper limb, including shoulder: Secondary | ICD-10-CM | POA: Diagnosis not present

## 2020-08-29 DIAGNOSIS — L578 Other skin changes due to chronic exposure to nonionizing radiation: Secondary | ICD-10-CM | POA: Diagnosis not present

## 2020-08-29 DIAGNOSIS — D0471 Carcinoma in situ of skin of right lower limb, including hip: Secondary | ICD-10-CM | POA: Diagnosis not present

## 2020-08-29 DIAGNOSIS — C44719 Basal cell carcinoma of skin of left lower limb, including hip: Secondary | ICD-10-CM | POA: Diagnosis not present

## 2020-08-29 DIAGNOSIS — D099 Carcinoma in situ, unspecified: Secondary | ICD-10-CM

## 2020-08-29 DIAGNOSIS — C44712 Basal cell carcinoma of skin of right lower limb, including hip: Secondary | ICD-10-CM

## 2020-08-29 DIAGNOSIS — Z85828 Personal history of other malignant neoplasm of skin: Secondary | ICD-10-CM

## 2020-08-29 DIAGNOSIS — C4491 Basal cell carcinoma of skin, unspecified: Secondary | ICD-10-CM

## 2020-08-29 DIAGNOSIS — D485 Neoplasm of uncertain behavior of skin: Secondary | ICD-10-CM

## 2020-08-29 DIAGNOSIS — C44519 Basal cell carcinoma of skin of other part of trunk: Secondary | ICD-10-CM

## 2020-08-29 DIAGNOSIS — C44619 Basal cell carcinoma of skin of left upper limb, including shoulder: Secondary | ICD-10-CM

## 2020-08-29 HISTORY — DX: Personal history of other malignant neoplasm of skin: Z85.828

## 2020-08-29 NOTE — Progress Notes (Signed)
Follow-Up Visit   Subjective  Rachael Austin is a 79 y.o. female who presents for the following: Follow-up (f/u for [facility] treatment of SCCIS on left dorsal forearm and left lateral calf and BCC on left posterior shoulder superior, left mid chest, and left lateral thigh).  She also has some new or larger spots.  The following portions of the chart were reviewed this encounter and updated as appropriate:  Tobacco   Allergies   Meds   Problems   Med Hx   Surg Hx   Fam Hx       Review of Systems: No other skin or systemic complaints except as noted in HPI or Assessment and Plan.   Objective  Well appearing patient in no apparent distress; mood and affect are within normal limits.  A focused examination was performed including left shoulder, chest, left arm, legs. Relevant physical exam findings are noted in the Assessment and Plan.  Objective  Right posterior calf: 1.4 cm scaly pink plaque with erosion R/o SCCIS       Objective  Left inferior knee lateral: 0.6 cm scaly pink papule R/o BCC       Objective  Left lateral calf: 0.6 cm scaly pink papule R/o BCC       Objective  Left inferior knee medial: 0.7 cm scaly pink papule R/o SCCIS       Objective  Right pretibia: Right pretibia 1.1 cm scaly pink plaque R/o SCC       Objective  Left posterior shoulder superior: Biopsy proven SCCIS     Objective  Left Mid Chest: Biopsy proven BCC     Objective  left lateral thigh: Biopsy proven BCC     Objective  left dorsal forearm: Biopsy proven SCCIS     Objective  left lateral calf: Biopsy proven SCCIS     Assessment & Plan    At site of all biopsies and ED&Cs, topical lidocaine 23%/tetracaine was applied locally and left in place for 20 minutes prior to numbing with injectable lidocaine per patient preference.   Neoplasm of uncertain behavior of skin (5) Right posterior calf  Skin / nail biopsy Type of biopsy:  tangential   Informed consent: discussed and consent obtained   Timeout: patient name, date of birth, surgical site, and procedure verified   Procedure prep:  Patient was prepped and draped in usual sterile fashion Prep type:  Isopropyl alcohol Anesthesia: the lesion was anesthetized in a standard fashion   Anesthetic:  1% lidocaine w/ epinephrine 1-100,000 buffered w/ 8.4% NaHCO3 Instrument used: flexible razor blade   Hemostasis achieved with: pressure, aluminum chloride and electrodesiccation   Outcome: patient tolerated procedure well   Post-procedure details: sterile dressing applied and wound care instructions given   Dressing type: bandage and petrolatum    Specimen 1 - Surgical pathology Differential Diagnosis: R/o SCCIS Check Margins: No 1.4 cm scaly pink plaque with errosion  Left inferior knee lateral  Skin / nail biopsy Type of biopsy: tangential   Informed consent: discussed and consent obtained   Timeout: patient name, date of birth, surgical site, and procedure verified   Procedure prep:  Patient was prepped and draped in usual sterile fashion Prep type:  Isopropyl alcohol Anesthesia: the lesion was anesthetized in a standard fashion   Anesthetic:  1% lidocaine w/ epinephrine 1-100,000 buffered w/ 8.4% NaHCO3 Instrument used: flexible razor blade   Hemostasis achieved with: pressure, aluminum chloride and electrodesiccation   Outcome: patient tolerated procedure well  Post-procedure details: sterile dressing applied and wound care instructions given   Dressing type: bandage and petrolatum    Specimen 2 - Surgical pathology Differential Diagnosis: R/o BCC Check Margins: No 0.6 cm scaly pink papule  Left lateral calf  Skin / nail biopsy Type of biopsy: tangential   Informed consent: discussed and consent obtained   Timeout: patient name, date of birth, surgical site, and procedure verified   Procedure prep:  Patient was prepped and draped in usual sterile  fashion Prep type:  Isopropyl alcohol Anesthesia: the lesion was anesthetized in a standard fashion   Anesthetic:  1% lidocaine w/ epinephrine 1-100,000 buffered w/ 8.4% NaHCO3 Instrument used: flexible razor blade   Hemostasis achieved with: pressure, aluminum chloride and electrodesiccation   Outcome: patient tolerated procedure well   Post-procedure details: sterile dressing applied and wound care instructions given   Dressing type: bandage and petrolatum    Specimen 4 - Surgical pathology Differential Diagnosis: R/o BCC Check Margins: No 0.6 cm scaly pink papule  Left inferior knee medial  Skin / nail biopsy Type of biopsy: tangential   Informed consent: discussed and consent obtained   Timeout: patient name, date of birth, surgical site, and procedure verified   Procedure prep:  Patient was prepped and draped in usual sterile fashion Prep type:  Isopropyl alcohol Anesthesia: the lesion was anesthetized in a standard fashion   Anesthetic:  1% lidocaine w/ epinephrine 1-100,000 buffered w/ 8.4% NaHCO3 Instrument used: flexible razor blade   Hemostasis achieved with: pressure, aluminum chloride and electrodesiccation   Outcome: patient tolerated procedure well   Post-procedure details: sterile dressing applied and wound care instructions given   Dressing type: bandage and petrolatum    Specimen 3 - Surgical pathology Differential Diagnosis: R/o SCCIS Check Margins: No 0.7 cm scaly pink papule  Right pretibia  Skin / nail biopsy Type of biopsy: tangential   Informed consent: discussed and consent obtained   Timeout: patient name, date of birth, surgical site, and procedure verified   Procedure prep:  Patient was prepped and draped in usual sterile fashion Prep type:  Isopropyl alcohol Anesthesia: the lesion was anesthetized in a standard fashion   Anesthetic:  1% lidocaine w/ epinephrine 1-100,000 buffered w/ 8.4% NaHCO3 Instrument used: flexible razor blade    Hemostasis achieved with: pressure, aluminum chloride and electrodesiccation   Outcome: patient tolerated procedure well   Post-procedure details: sterile dressing applied and wound care instructions given   Dressing type: bandage and petrolatum    Specimen 5 - Surgical pathology Differential Diagnosis: R/o SCC Check Margins: No 1.1 cm scaly pink plaque  Other Related Medications mupirocin ointment (BACTROBAN) 2 %  Basal cell carcinoma (BCC) of skin of left upper extremity including shoulder Left posterior shoulder superior  Destruction of lesion  Destruction method: electrodesiccation and curettage   Informed consent: discussed and consent obtained   Timeout:  patient name, date of birth, surgical site, and procedure verified Anesthesia: the lesion was anesthetized in a standard fashion   Anesthetic:  1% lidocaine w/ epinephrine 1-100,000 buffered w/ 8.4% NaHCO3 Curettage performed in three different directions: Yes   Electrodesiccation performed over the curetted area: Yes   Final wound size (cm):  1.5 Hemostasis achieved with:  electrodesiccation Outcome: patient tolerated procedure well with no complications   Post-procedure details: sterile dressing applied and wound care instructions given   Dressing type: petrolatum    Basal cell carcinoma (BCC), unspecified site Left Mid Chest  Destruction of lesion  Destruction method:  electrodesiccation and curettage   Informed consent: discussed and consent obtained   Timeout:  patient name, date of birth, surgical site, and procedure verified Anesthesia: the lesion was anesthetized in a standard fashion   Anesthetic:  1% lidocaine w/ epinephrine 1-100,000 buffered w/ 8.4% NaHCO3 Curettage performed in three different directions: Yes   Electrodesiccation performed over the curetted area: Yes   Final wound size (cm):  1.6 Hemostasis achieved with:  electrodesiccation Outcome: patient tolerated procedure well with no  complications   Post-procedure details: sterile dressing applied and wound care instructions given   Dressing type: petrolatum    Final defect 1.6 cm  Basal cell carcinoma (BCC) of skin of left lower extremity including hip left lateral thigh  Destruction of lesion  Destruction method: electrodesiccation and curettage   Informed consent: discussed and consent obtained   Timeout:  patient name, date of birth, surgical site, and procedure verified Anesthesia: the lesion was anesthetized in a standard fashion   Anesthetic:  1% lidocaine w/ epinephrine 1-100,000 buffered w/ 8.4% NaHCO3 Curettage performed in three different directions: Yes   Electrodesiccation performed over the curetted area: Yes   Final wound size (cm):  1.4 Hemostasis achieved with:  electrodesiccation Outcome: patient tolerated procedure well with no complications   Post-procedure details: sterile dressing applied and wound care instructions given   Dressing type: petrolatum    Squamous cell carcinoma in situ (2) left dorsal forearm  Destruction of lesion Complexity: simple   Destruction method: electrodesiccation and curettage   Informed consent: discussed and consent obtained   Timeout:  patient name, date of birth, surgical site, and procedure verified Anesthesia: the lesion was anesthetized in a standard fashion   Anesthetic:  1% lidocaine w/ epinephrine 1-100,000 buffered w/ 8.4% NaHCO3 Curettage performed in three different directions: Yes   Electrodesiccation performed over the curetted area: Yes   Final wound size (cm):  2.2 Hemostasis achieved with:  electrodesiccation Outcome: patient tolerated procedure well with no complications   Post-procedure details: sterile dressing applied and wound care instructions given   Dressing type: petrolatum    left lateral calf  Destruction of lesion  Destruction method: electrodesiccation and curettage   Informed consent: discussed and consent obtained    Timeout:  patient name, date of birth, surgical site, and procedure verified Anesthesia: the lesion was anesthetized in a standard fashion   Anesthetic:  1% lidocaine w/ epinephrine 1-100,000 buffered w/ 8.4% NaHCO3 Curettage performed in three different directions: Yes   Electrodesiccation performed over the curetted area: Yes   Final wound size (cm):  2.1 Hemostasis achieved with:  electrodesiccation Outcome: patient tolerated procedure well with no complications   Post-procedure details: sterile dressing applied and wound care instructions given   Dressing type: petrolatum    Actinic Damage - Severe, chronic, secondary to cumulative UV radiation exposure over time - diffuse scaly erythematous macules and papules with underlying dyspigmentation - Discussed Prescription "Field Treatment" for Severe, Confluent Actinic Changes with Pre-Cancerous Actinic Keratoses Field treatment involves treatment of an entire area of skin that has confluent Actinic Changes (Sun/ Ultraviolet light damage) and PreCancerous Actinic Keratoses by method of PhotoDynamic Therapy (PDT) and/or prescription Topical Chemotherapy agents such as 5-fluorouracil, 5-fluorouracil/calcipotriene, and/or imiquimod.  The purpose is to decrease the number of clinically evident and subclinical PreCancerous lesions to prevent progression to development of skin cancer by chemically destroying early precancer changes that may or may not be visible.  It has been shown to reduce the risk of developing skin cancer  in the treated area. As a result of treatment, redness, scaling, crusting, and open sores may occur during treatment course. One or more than one of these methods may be used and may have to be used several times to control, suppress and eliminate the PreCancerous changes. Discussed treatment course, expected reaction, and possible side effects. - Recommend daily broad spectrum sunscreen SPF 30+ to sun-exposed areas, reapply every 2  hours as needed.  - Call for new or changing lesions. - She defers at this time but may consider at follow-up in new year. - Recommend taking Nicotinamide 500mg  twice per day to lower risk of non-melanoma skin cancer by approximately 25%.    RTC 6 weeks  I, Harriett Sine, CMA, am acting as scribe for Forest Gleason, MD.  Documentation: I have reviewed the above documentation for accuracy and completeness, and I agree with the above.  Forest Gleason, MD

## 2020-08-29 NOTE — Patient Instructions (Addendum)
Recommend Nicotinamide 500mg  twice per day to lower risk of non-melanoma skin cancer by approximately 25%.    Electrodesiccation and Curettage ("Scrape and Burn") Wound Care Instructions  1. Leave the original bandage on for 24 hours if possible.  If the bandage becomes soaked or soiled before that time, it is OK to remove it and examine the wound.  A small amount of post-operative bleeding is normal.  If excessive bleeding occurs, remove the bandage, place gauze over the site and apply continuous pressure (no peeking) over the area for 30 minutes. If this does not work, please call our clinic as soon as possible or page your doctor if it is after hours.   2. Once a day, cleanse the wound with soap and water. It is fine to shower. If a thick crust develops you may use a Q-tip dipped into dilute hydrogen peroxide (mix 1:1 with water) to dissolve it.  Hydrogen peroxide can slow the healing process, so use it only as needed.    3. After washing, apply petroleum jelly (Vaseline) or an antibiotic ointment if your doctor prescribed one for you, followed by a bandage.    4. For best healing, the wound should be covered with a layer of ointment at all times. If you are not able to keep the area covered with a bandage to hold the ointment in place, this may mean re-applying the ointment several times a day.  Continue this wound care until the wound has healed and is no longer open. It may take several weeks for the wound to heal and close.  Itching and mild discomfort is normal during the healing process.  If you have any discomfort, you can take Tylenol (acetaminophen) or ibuprofen as directed on the bottle. (Please do not take these if you have an allergy to them or cannot take them for another reason).  Some redness, tenderness and white or yellow material in the wound is normal healing.  If the area becomes very sore and red, or develops a thick yellow-green material (pus), it may be infected; please  notify us.    Wound healing continues for up to one year following surgery. It is not unusual to experience pain in the scar from time to time during the interval.  If the pain becomes severe or the scar thickens, you should notify the office.    A slight amount of redness in a scar is expected for the first six months.  After six months, the redness will fade and the scar will soften and fade.  The color difference becomes less noticeable with time.  If there are any problems, return for a post-op surgery check at your earliest convenience.  To improve the appearance of the scar, you can use silicone scar gel, cream, or sheets (such as Mederma or Serica) every night for up to one year. These are available over the counter (without a prescription).  Please call our office at [phone] for any questions or concerns.

## 2020-08-30 ENCOUNTER — Other Ambulatory Visit (INDEPENDENT_AMBULATORY_CARE_PROVIDER_SITE_OTHER): Payer: Medicare PPO

## 2020-08-30 ENCOUNTER — Other Ambulatory Visit: Payer: Self-pay

## 2020-08-30 ENCOUNTER — Telehealth: Payer: Self-pay | Admitting: Internal Medicine

## 2020-08-30 DIAGNOSIS — R7989 Other specified abnormal findings of blood chemistry: Secondary | ICD-10-CM

## 2020-08-30 LAB — TSH: TSH: 3.1 u[IU]/mL (ref 0.35–4.50)

## 2020-08-30 MED ORDER — TELMISARTAN 20 MG PO TABS
20.0000 mg | ORAL_TABLET | Freq: Every day | ORAL | 1 refills | Status: DC
Start: 2020-08-30 — End: 2020-10-02

## 2020-08-30 NOTE — Telephone Encounter (Signed)
Patient need refill on telmisartan (MICARDIS) 20 MG tablet she is out

## 2020-09-06 ENCOUNTER — Telehealth: Payer: Self-pay

## 2020-09-06 NOTE — Telephone Encounter (Signed)
Patient has been advised of all biopsies and plan to treat when returning on January 20th, 2022.

## 2020-09-06 NOTE — Telephone Encounter (Signed)
-----   Message from Alfonso Patten, MD sent at 09/05/2020  5:24 PM EST ----- 1. Skin , right posterior calf SQUAMOUS CELL CARCINOMA IN SITU OVERLYING FIBROSING GRANULATION TISSUE --> ED&C  2. Skin , left inferior knee lateral BASAL CELL CARCINOMA, SUPERFICIAL AND NODULAR PATTERNS --> ED&C  3. Skin , left inferior knee medial BASAL CELL CARCINOMA, SUPERFICIAL AND NODULAR PATTERNS --> ED&C  4. Skin , left lateral calf SQUAMOUS CELL CARCINOMA IN SITU --> ED&C  5. Skin , right pretibia SUPERFICIAL BASAL CELL CARCINOMA --> ED&C  Dr. Jerilynn Mages left voicemail 09/05/2020.   MAs please call starting 09/06/2020. We'll need to schedule a time when we have a longer block of time to do them all at once and I don't see any good openings. Please see if she can come at 9:30 on 10/19/2019 (she's scheduled at 10:30 for follow-up) so we can put on the numbing cream for 30 minutes and then I can do the treatments over my break. Thank you!

## 2020-09-26 ENCOUNTER — Encounter: Payer: Self-pay | Admitting: Dermatology

## 2020-10-02 ENCOUNTER — Other Ambulatory Visit: Payer: Self-pay | Admitting: Internal Medicine

## 2020-10-17 ENCOUNTER — Other Ambulatory Visit: Payer: Self-pay

## 2020-10-17 ENCOUNTER — Ambulatory Visit (INDEPENDENT_AMBULATORY_CARE_PROVIDER_SITE_OTHER): Payer: Medicare PPO | Admitting: Internal Medicine

## 2020-10-17 VITALS — BP 136/78 | HR 90 | Temp 98.2°F | Resp 16 | Ht 64.0 in | Wt 151.2 lb

## 2020-10-17 DIAGNOSIS — Z85820 Personal history of malignant melanoma of skin: Secondary | ICD-10-CM | POA: Diagnosis not present

## 2020-10-17 DIAGNOSIS — Z0000 Encounter for general adult medical examination without abnormal findings: Secondary | ICD-10-CM

## 2020-10-17 DIAGNOSIS — I10 Essential (primary) hypertension: Secondary | ICD-10-CM

## 2020-10-17 DIAGNOSIS — R739 Hyperglycemia, unspecified: Secondary | ICD-10-CM

## 2020-10-17 DIAGNOSIS — E7800 Pure hypercholesterolemia, unspecified: Secondary | ICD-10-CM

## 2020-10-17 DIAGNOSIS — F439 Reaction to severe stress, unspecified: Secondary | ICD-10-CM | POA: Diagnosis not present

## 2020-10-17 DIAGNOSIS — R0989 Other specified symptoms and signs involving the circulatory and respiratory systems: Secondary | ICD-10-CM

## 2020-10-17 DIAGNOSIS — K219 Gastro-esophageal reflux disease without esophagitis: Secondary | ICD-10-CM | POA: Diagnosis not present

## 2020-10-17 DIAGNOSIS — I700 Atherosclerosis of aorta: Secondary | ICD-10-CM

## 2020-10-17 NOTE — Progress Notes (Unsigned)
[name], female   DOB: 05-31-1941, 80 y.o.   MRN: [id number]   Subjective:    [name], female    DOB: Jun 19, 1941, 80 y.o.   MRN: [id number]  HPI This visit occurred during the SARS-CoV-2 public health emergency.  Safety protocols were in place, including screening questions prior to the visit, additional usage of staff PPE, and extensive cleaning of exam room while observing appropriate contact time as indicated for disinfecting solutions.  Patient here for physical exam.  She reports she is doing relatively well.  Tries to stay active.  No chest pain or sob with increased activity or exertion.  No abdominal pain.  Bowels moving. No urinary issues reported.  Taking buspar.  Feels she is doing well on this medication.  Handling stress.    Past Medical History:  Diagnosis Date  . Basal cell carcinoma 06/21/2020   left post shoulder sup, left mid chest, left lat thigh  . BCC (basal cell carcinoma of skin) 03/30/2007   R inf med pretibial - BCC  . BCC (basal cell carcinoma of skin) 10/12/2013   L nasal ala - BCC  . BCC (basal cell carcinoma of skin) 03/10/2018   L mid dorsum med forearm - superficial BCC   . Breast screening, unspecified 2013  . Cancer ([place]) 1992   skin  . Degenerative disk disease   . GERD (gastroesophageal reflux disease)   . History of basal cell carcinoma (BCC) 08/29/2020    left inferior knee lateral, , left inferior knee medial, right pretibia  . History of SCC (squamous cell carcinoma) of skin 08/29/2020   right posterior calf, left lateral calf, and   . Hypercholesterolemia 2008  . Interstitial cystitis    followed by Dr Jacqlyn Larsen  . Other sign and symptom in breast 2013   Left upper outer quadrant breast "soreness" Ultrasound exam of right breast in the 2 o'clock position with the breast distracted medially showed a 0.3-0.4 with 0.5 cm simple cyst. In the 1 o'clock position where pt reported tenderness US exam was  negatiive.  Because of her history of intermittent nipple drainage, ultrasound was completed of the retroareolar area.  . Personal history of tobacco use, presenting hazards to health   . PONV (postoperative nausea and vomiting)   . SCC (squamous cell carcinoma) 03/28/2008   R dorsum hand - SCC  . SCC (squamous cell carcinoma) 05/09/2009   R lat lower leg - SCCIS  . SCC (squamous cell carcinoma) 05/09/2009   L lat lower leg - SCCIS  . SCC (squamous cell carcinoma) 04/07/2013   L lower leg - SCCIS  . SCC (squamous cell carcinoma) 04/27/2013   R forearm - SCC  . SCC (squamous cell carcinoma) 04/28/2017   L lat knee - SCCIS  . SCC (squamous cell carcinoma) 05/12/2018   L prox dorsum forearm - SCC  . SCC (squamous cell carcinoma), leg, right 03/30/2007   R sup pretibial - SCCIS  . SCC (squamous cell carcinoma);BCC 10/12/2013   Mid back - superficial BCC with SCCIS   . Special screening for malignant neoplasms, colon   . 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   left dorsal forearm, left lat calf   Past Surgical History:  Procedure Laterality Date  . ABDOMINAL HYSTERECTOMY  1992  . APPENDECTOMY    . CERVICAL DISCECTOMY     S/P C7-T1 discectomy with fusion  . CHOLECYSTECTOMY  1990  . COLONOSCOPY WITH PROPOFOL N/A 02/14/2016  Procedure: COLONOSCOPY WITH PROPOFOL;  Surgeon: Lucilla Lame, MD;  Location: [facility];  Service: Endoscopy;  Laterality: N/A;  PT WOULD LIKE 10 ARRIVAL TIME OR LATER  . DILATION AND CURETTAGE OF UTERUS    . ESOPHAGOGASTRODUODENOSCOPY (EGD) WITH PROPOFOL N/A 02/14/2016   Procedure: ESOPHAGOGASTRODUODENOSCOPY (EGD) WITH PROPOFOL with dialtion;  Surgeon: Lucilla Lame, MD;  Location: [facility];  Service: Endoscopy;  Laterality: N/A;  . ESOPHAGOGASTRODUODENOSCOPY (EGD) WITH PROPOFOL N/A 04/13/2019   Procedure: ESOPHAGOGASTRODUODENOSCOPY (EGD) WITH PROPOFOL;  Surgeon: Toledo, Benay Pike, MD;  Location: [facility];  Service: Gastroenterology;   Laterality: N/A;  . MELANOMA EXCISION     removed from Left calf 1994   Family History  Problem Relation Age of Onset  . Hodgkin's lymphoma Mother   . Heart failure Father   . Heart attack Father   . Arthritis Sister        Three sisters w/ degeneratve disk disease  . Headache Sister        Two sisters hx of headache  . Breast cancer Neg Hx   . Colon cancer Neg Hx   . Bladder Cancer Neg Hx   . Kidney cancer Neg Hx    Social History   Socioeconomic History  . Marital status: Widowed    Spouse name: Not on file  . Number of children: 2  . Years of education: Not on file  . Highest education level: Not on file  Occupational History  . Not on file  Tobacco Use  . Smoking status: Former Smoker    Packs/day: 1.00    Years: 15.00    Pack years: 15.00    Types: Cigarettes  . Smokeless tobacco: Never Used  Substance and Sexual Activity  . Alcohol use: No    Alcohol/week: 0.0 standard drinks  . Drug use: No  . Sexual activity: Not on file  Other Topics Concern  . Not on file  Social History Narrative   Married and has 2 children, daughters.   Social Determinants of Health   Financial Resource Strain: Not on file  Food Insecurity: Not on file  Transportation Needs: Not on file  Physical Activity: Not on file  Stress: Not on file  Social Connections: Not on file    Outpatient Encounter Medications as of 10/17/2020  Medication Sig  . acetaminophen (TYLENOL) 325 MG tablet Take 650 mg by mouth as needed.  Marland Kitchen esomeprazole (NEXIUM) 40 MG capsule Take 1 capsule (40 mg total) by mouth daily.  . famotidine (PEPCID) 40 MG tablet Take 1 tablet (40 mg total) by mouth daily.  Marland Kitchen ibuprofen (ADVIL,MOTRIN) 200 MG tablet Take 200 mg by mouth every 6 (six) hours as needed.  Marland Kitchen imipramine (TOFRANIL) 10 MG tablet Take 1 tablet (10 mg total) by mouth at bedtime.  . Melatonin 10 MG CAPS Take by mouth as needed.   . Multiple Vitamin (MULTI-VITAMIN DAILY PO) Take 1 tablet by mouth daily.  .  mupirocin ointment (BACTROBAN) 2 % Apply to affected area bid  . ondansetron (ZOFRAN-ODT) 4 MG disintegrating tablet Take 1 tablet (4 mg total) by mouth every 8 (eight) hours as needed for nausea or vomiting.  . trospium (SANCTURA) 20 MG tablet Take 1 tablet (20 mg total) by mouth daily.  . [DISCONTINUED] busPIRone (BUSPAR) 5 MG tablet Take 1 tablet by mouth twice daily  . [DISCONTINUED] lovastatin (MEVACOR) 20 MG tablet Take 1 tablet (20 mg total) by mouth at bedtime.  . [DISCONTINUED] telmisartan (MICARDIS) 20 MG tablet Take  1 tablet by mouth once daily  . busPIRone (BUSPAR) 5 MG tablet Take 1 tablet (5 mg total) by mouth 2 (two) times daily.  Marland Kitchen. lovastatin (MEVACOR) 20 MG tablet Take 1 tablet (20 mg total) by mouth at bedtime.  Marland Kitchen. telmisartan (MICARDIS) 20 MG tablet Take 1 tablet (20 mg total) by mouth daily.   No facility-administered encounter medications on file as of 10/17/2020.    Review of Systems  Constitutional: Negative for appetite change and unexpected weight change.  HENT: Negative for congestion, sinus pressure and sore throat.   Eyes: Negative for pain and visual disturbance.  Respiratory: Negative for cough, chest tightness and shortness of breath.   Cardiovascular: Negative for chest pain, palpitations and leg swelling.  Gastrointestinal: Negative for abdominal pain, diarrhea, nausea and vomiting.  Genitourinary: Negative for difficulty urinating and dysuria.  Musculoskeletal: Negative for joint swelling and myalgias.  Skin: Negative for color change and rash.  Neurological: Negative for dizziness, light-headedness and headaches.  Hematological: Negative for adenopathy. Does not bruise/bleed easily.  Psychiatric/Behavioral: Negative for decreased concentration and dysphoric mood.       Objective:    Physical Exam Vitals reviewed.  Constitutional:      General: She is not in acute distress.    Appearance: Normal appearance. She is well-developed and well-nourished.   HENT:     Head: Normocephalic and atraumatic.     Right Ear: External ear normal.     Left Ear: External ear normal.     Mouth/Throat:     Mouth: Oropharynx is clear and moist.  Eyes:     General: No scleral icterus.       Right eye: No discharge.        Left eye: No discharge.     Conjunctiva/sclera: Conjunctivae normal.  Neck:     Thyroid: No thyromegaly.  Cardiovascular:     Rate and Rhythm: Normal rate and regular rhythm.  Pulmonary:     Effort: No tachypnea, accessory muscle usage or respiratory distress.     Breath sounds: Normal breath sounds. No decreased breath sounds or wheezing.  Chest:  Breasts:     Right: No inverted nipple, mass, nipple discharge or tenderness (no axillary adenopathy).     Left: No inverted nipple, mass, nipple discharge or tenderness (no axilarry adenopathy).    Abdominal:     General: Bowel sounds are normal.     Palpations: Abdomen is soft.     Tenderness: There is no abdominal tenderness.  Musculoskeletal:        General: No swelling, tenderness or edema.     Cervical back: Neck supple. No tenderness.  Lymphadenopathy:     Cervical: No cervical adenopathy.  Skin:    Findings: No erythema or rash.  Neurological:     Mental Status: She is alert and oriented to person, place, and time.  Psychiatric:        Mood and Affect: Mood and affect and mood normal.        Behavior: Behavior normal.     BP 136/78   Pulse 90   Temp 98.2 F (36.8 C) (Oral)   Resp 16   Ht 5\' 4"  (1.626 m)   Wt 151 lb 3.2 oz (68.6 kg)   LMP 08/09/2012   SpO2 98%   BMI 25.95 kg/m  Wt Readings from Last 3 Encounters:  10/17/20 151 lb 3.2 oz (68.6 kg)  07/11/20 149 lb (67.6 kg)  05/29/20 146 lb 12.8 oz (66.6 kg)  Lab Results  Component Value Date   WBC 4.7 07/18/2020   HGB 13.1 07/18/2020   HCT 38.8 07/18/2020   PLT 241.0 07/18/2020   GLUCOSE 93 07/18/2020   CHOL 183 07/18/2020   TRIG 66.0 07/18/2020   HDL 77.30 07/18/2020   LDLCALC 93  07/18/2020   ALT 11 07/18/2020   AST 16 07/18/2020   NA 138 07/18/2020   K 4.4 07/18/2020   CL 102 07/18/2020   CREATININE 0.96 07/18/2020   BUN 6 07/18/2020   CO2 31 07/18/2020   TSH 3.10 08/30/2020   HGBA1C 5.8 06/07/2019    DG Bone Density  Result Date: 01/04/2020 EXAM: DUAL X-RAY ABSORPTIOMETRY (DXA) FOR BONE MINERAL DENSITY IMPRESSION: Your patient Rachael Austin completed a BMD test on 01/04/2020 using the [place] (software version: 14.10) manufactured by [employer]. The following summarizes the results of our evaluation. Technologist: MTB PATIENT BIOGRAPHICAL: Name: Rachael Austin, Rachael Austin Patient ID: [id number] Birth Date: Mar 03, 1941 Height:     64.0 in. Gender: Female Exam Date: 01/04/2020 Weight:     149.0 lbs. Indications: Caucasian, Hysterectomy, Oophorectomy Bilateral, Postmenopausal Fractures: Treatments: Multi-Vitamin, Nexium, Vitamin D DENSITOMETRY RESULTS: Site         Region     Measured Date Measured Age WHO Classification Young Adult T-score BMD         %Change vs. Previous Significant Change (*) DualFemur Neck Right 01/04/2020 79.9 Osteoporosis -2.7 0.669 g/cm2 - - DualFemur Total Mean 01/04/2020 79.9 Osteopenia -2.3 0.720 g/cm2 - - Left Forearm Radius 33% 01/04/2020 79.9 Osteoporosis -3.1 0.602 g/cm2 - - ASSESSMENT: The BMD measured at Forearm Radius 33% is 0.602 g/cm2 with a T-score of -3.1. This patient is considered OSTEOPOROTIC according to [facility]) criteria. The scan quality is good. Lumbar spine was not utilized due to advanced degenerative changes. World Pharmacologist [facility]) criteria for post-menopausal, Caucasian Women: Normal:                   T-score at or above -1 SD Osteopenia/low bone mass: T-score between -1 and -2.5 SD Osteoporosis:             T-score at or below -2.5 SD RECOMMENDATIONS: 1. All patients should optimize calcium and vitamin D intake. 2. Consider FDA-approved medical therapies in postmenopausal  women and men aged 45 years and older, based on the following: a. A hip or vertebral(clinical or morphometric) fracture b. T-score < -2.5 at the femoral neck or spine after appropriate evaluation to exclude secondary causes c. Low bone mass (T-score between -1.0 and -2.5 at the femoral neck or spine) and a 10-year probability of a hip fracture > 3% or a 10-year probability of a major osteoporosis-related fracture > 20% based on the [place]-adapted WHO algorithm 3. Clinician judgment and/or patient preferences may indicate treatment for people with 10-year fracture probabilities above or below these levels FOLLOW-UP: People with diagnosed cases of osteoporosis or at high risk for fracture should have regular bone mineral density tests. For patients eligible for Medicare, routine testing is allowed once every 2 years. The testing frequency can be increased to one year for patients who have rapidly progressing disease, those who are receiving or discontinuing medical therapy to restore bone mass, or have additional risk factors. I have reviewed this report, and agree with the above findings. Mark A. Thornton Papas, M.D. [facility] Electronically Signed   By: Lavonia Dana M.D.   On: 01/04/2020 12:22   MM 3D SCREEN  BREAST BILATERAL  Result Date: 01/04/2020 CLINICAL DATA:  Screening. EXAM: DIGITAL SCREENING BILATERAL MAMMOGRAM WITH TOMO AND CAD COMPARISON:  Previous exam(s). ACR Breast Density Category b: There are scattered areas of fibroglandular density. FINDINGS: There are no findings suspicious for malignancy. Images were processed with CAD. IMPRESSION: No mammographic evidence of malignancy. A result letter of this screening mammogram will be mailed directly to the patient. RECOMMENDATION: Screening mammogram in one year. (Code:SM-B-01Y) BI-RADS CATEGORY  1: Negative. Electronically Signed   By: Lillia Mountain M.D.   On: 01/04/2020 12:38       Assessment & Plan:   Problem List Items Addressed This Visit     Aortic atherosclerosis ([place])    Continue lovastatin.       Relevant Medications   lovastatin (MEVACOR) 20 MG tablet   telmisartan (MICARDIS) 20 MG tablet   Essential hypertension    On micardis.  Blood pressure on recheck improved.  Continue micardis.  Follow pressures.  Follow metabolic panel.       Relevant Medications   lovastatin (MEVACOR) 20 MG tablet   telmisartan (MICARDIS) 20 MG tablet   Other Relevant Orders   TSH   Basic metabolic panel   GERD (gastroesophageal reflux disease)    No upper symptoms reported.  On nexium.        H/O Malignant melanoma    Followed by dermatology.       Health care maintenance    Physical today 10/18/19. S/p hysterectomy.  Mammogram 01/04/20 - Birads I.  Colonoscopy 01/2016 - diverticulosis and non bleeding internal hemorrhoids.       Hypercholesterolemia    Low cholesterol diet and exercise.  Follow lipid panel and liver function tests.  Continue on mevacor.        Relevant Medications   lovastatin (MEVACOR) 20 MG tablet   telmisartan (MICARDIS) 20 MG tablet   Other Relevant Orders   Lipid panel   Hepatic function panel   Hyperglycemia    Blood sugar checked recently and slightly elevated.  Check fasting glucose and a1c.        Relevant Orders   Hemoglobin A1c   Left carotid bruit    04/20/20 - carotid ultrasound - no significant blockage.        Stress    Doing well on buspar.  Does not feel needs anything more at this time.  Follow.         Other Visit Diagnoses    Routine general medical examination at a health care facility    -  Primary       Einar Pheasant, MD

## 2020-10-18 ENCOUNTER — Encounter: Payer: Self-pay | Admitting: Internal Medicine

## 2020-10-18 ENCOUNTER — Ambulatory Visit: Payer: Medicare PPO | Admitting: Dermatology

## 2020-10-18 DIAGNOSIS — R739 Hyperglycemia, unspecified: Secondary | ICD-10-CM | POA: Insufficient documentation

## 2020-10-18 MED ORDER — BUSPIRONE HCL 5 MG PO TABS
5.0000 mg | ORAL_TABLET | Freq: Two times a day (BID) | ORAL | 1 refills | Status: DC
Start: 2020-10-18 — End: 2021-05-15

## 2020-10-18 MED ORDER — TELMISARTAN 20 MG PO TABS
20.0000 mg | ORAL_TABLET | Freq: Every day | ORAL | 1 refills | Status: DC
Start: 2020-10-18 — End: 2020-12-17

## 2020-10-18 MED ORDER — LOVASTATIN 20 MG PO TABS
20.0000 mg | ORAL_TABLET | Freq: Every day | ORAL | 1 refills | Status: DC
Start: 2020-10-18 — End: 2021-06-19

## 2020-10-18 NOTE — Assessment & Plan Note (Signed)
Followed by dermatology

## 2020-10-18 NOTE — Assessment & Plan Note (Signed)
Continue lovastatin 

## 2020-10-18 NOTE — Assessment & Plan Note (Signed)
Doing well on buspar.  Does not feel needs anything more at this time.  Follow.

## 2020-10-18 NOTE — Assessment & Plan Note (Signed)
No upper symptoms reported.  On nexium.   

## 2020-10-18 NOTE — Assessment & Plan Note (Signed)
04/20/20 - carotid ultrasound - no significant blockage.

## 2020-10-18 NOTE — Assessment & Plan Note (Signed)
On micardis.  Blood pressure on recheck improved.  Continue micardis.  Follow pressures.  Follow metabolic panel.

## 2020-10-18 NOTE — Assessment & Plan Note (Signed)
Blood sugar checked recently and slightly elevated.  Check fasting glucose and a1c.

## 2020-10-18 NOTE — Assessment & Plan Note (Addendum)
Physical today 10/18/19. S/p hysterectomy.  Mammogram 01/04/20 - Birads I.  Colonoscopy 01/2016 - diverticulosis and non bleeding internal hemorrhoids.

## 2020-10-18 NOTE — Assessment & Plan Note (Signed)
Low cholesterol diet and exercise.  Follow lipid panel and liver function tests.  Continue on mevacor.

## 2020-11-02 ENCOUNTER — Telehealth: Payer: Self-pay | Admitting: Internal Medicine

## 2020-11-02 NOTE — Telephone Encounter (Signed)
FYI

## 2020-11-02 NOTE — Telephone Encounter (Signed)
Patient called in with B/p reading:2-3( 138/61),2-4 (125/65)

## 2020-11-02 NOTE — Telephone Encounter (Signed)
Notify pt blood pressure ok  continue to monitor

## 2020-11-05 NOTE — Telephone Encounter (Signed)
Pt aware.

## 2020-11-28 ENCOUNTER — Telehealth: Payer: Self-pay | Admitting: Internal Medicine

## 2020-11-28 NOTE — Telephone Encounter (Signed)
Patient called in wanted to know if the labs she is having on 3-8 includes her kidney functions if not she would like them to

## 2020-11-28 NOTE — Telephone Encounter (Signed)
Left detailed message for patient.

## 2020-12-03 ENCOUNTER — Other Ambulatory Visit: Payer: Self-pay | Admitting: Urology

## 2020-12-03 DIAGNOSIS — N3281 Overactive bladder: Secondary | ICD-10-CM

## 2020-12-04 ENCOUNTER — Other Ambulatory Visit: Payer: Self-pay

## 2020-12-04 ENCOUNTER — Other Ambulatory Visit (INDEPENDENT_AMBULATORY_CARE_PROVIDER_SITE_OTHER): Payer: Medicare PPO

## 2020-12-04 DIAGNOSIS — I10 Essential (primary) hypertension: Secondary | ICD-10-CM | POA: Diagnosis not present

## 2020-12-04 DIAGNOSIS — R739 Hyperglycemia, unspecified: Secondary | ICD-10-CM | POA: Diagnosis not present

## 2020-12-04 DIAGNOSIS — E7800 Pure hypercholesterolemia, unspecified: Secondary | ICD-10-CM

## 2020-12-04 LAB — BASIC METABOLIC PANEL
BUN: 8 mg/dL (ref 6–23)
CO2: 30 mEq/L (ref 19–32)
Calcium: 9.2 mg/dL (ref 8.4–10.5)
Chloride: 102 mEq/L (ref 96–112)
Creatinine, Ser: 0.95 mg/dL (ref 0.40–1.20)
GFR: 56.43 mL/min — ABNORMAL LOW (ref >60.00)
Glucose, Bld: 97 mg/dL (ref 70–99)
Potassium: 4.4 mEq/L (ref 3.5–5.1)
Sodium: 138 mEq/L (ref 135–145)

## 2020-12-04 LAB — HEPATIC FUNCTION PANEL
ALT: 9 U/L (ref 0–35)
AST: 15 U/L (ref 0–37)
Albumin: 4.2 g/dL (ref 3.5–5.2)
Alkaline Phosphatase: 76 U/L (ref 39–117)
Bilirubin, Direct: 0.1 mg/dL (ref 0.0–0.3)
Total Bilirubin: 0.6 mg/dL (ref 0.2–1.2)
Total Protein: 6.2 g/dL (ref 6.0–8.3)

## 2020-12-04 LAB — LIPID PANEL
Cholesterol: 182 mg/dL (ref 0–200)
HDL: 82.5 mg/dL (ref >39.00)
LDL Cholesterol: 87 mg/dL (ref 0–99)
NonHDL: 99.13
Total CHOL/HDL Ratio: 2
Triglycerides: 59 mg/dL (ref 0.0–149.0)
VLDL: 11.8 mg/dL (ref 0.0–40.0)

## 2020-12-04 LAB — HEMOGLOBIN A1C: Hgb A1c MFr Bld: 5.8 % (ref 4.6–6.5)

## 2020-12-04 LAB — TSH: TSH: 3.9 u[IU]/mL (ref 0.35–4.50)

## 2020-12-05 ENCOUNTER — Other Ambulatory Visit: Payer: Self-pay

## 2020-12-05 ENCOUNTER — Ambulatory Visit: Payer: Medicare PPO | Admitting: Dermatology

## 2020-12-05 DIAGNOSIS — D0471 Carcinoma in situ of skin of right lower limb, including hip: Secondary | ICD-10-CM | POA: Diagnosis not present

## 2020-12-05 DIAGNOSIS — C44712 Basal cell carcinoma of skin of right lower limb, including hip: Secondary | ICD-10-CM | POA: Diagnosis not present

## 2020-12-05 DIAGNOSIS — C44719 Basal cell carcinoma of skin of left lower limb, including hip: Secondary | ICD-10-CM

## 2020-12-05 DIAGNOSIS — D099 Carcinoma in situ, unspecified: Secondary | ICD-10-CM

## 2020-12-05 MED ORDER — IMIQUIMOD 5 % EX CREA: TOPICAL_CREAM | Freq: Every evening | CUTANEOUS | 1 refills | Status: DC

## 2020-12-05 NOTE — Patient Instructions (Signed)

## 2020-12-05 NOTE — Progress Notes (Signed)
Follow-Up Visit   Subjective  Rachael Austin is a 80 y.o. female who presents for the following: Procedure (Patient here today to treat bx proven BCC and SCC with EDC today. ).  The following portions of the chart were reviewed this encounter and updated as appropriate:   Tobacco  Allergies  Meds  Problems  Med Hx  Surg Hx  Fam Hx      Review of Systems:  No other skin or systemic complaints except as noted in HPI or Assessment and Plan.  Objective  Well appearing patient in no apparent distress; mood and affect are within normal limits.  A focused examination was performed including legs. Relevant physical exam findings are noted in the Assessment and Plan.  Objective  Right pretibia: Healing biopsy site  Objective  Right posterior calf: Pink plaque  Objective  Left lateral calf: clear  Objective  Left inf knee lateral: Healing bx site  Objective  Left inf knee medial: Healing bx site   Assessment & Plan  Basal cell carcinoma (BCC) of skin of right lower extremity including hip Right pretibia  Bx proven, superficial Chronic condition with expected duration over one year. Pt prefers to avoid ED&C if possible.  Start prescription imiquimod thin layer to affected area 5 days a week x 6 weeks. Reviewed expected irritation and inflammation.    Ordered Medications: imiquimod (ALDARA) 5 % cream  Squamous cell carcinoma in situ (2) Right posterior calf  Destruction of lesion  Destruction method: electrodesiccation and curettage   Informed consent: discussed and consent obtained   Timeout:  patient name, date of birth, surgical site, and procedure verified Patient was prepped and draped in usual sterile fashion: area prepped with isopropyl alcohol. Anesthesia: the lesion was anesthetized in a standard fashion   Anesthetic:  1% lidocaine w/ epinephrine 1-100,000 buffered w/ 8.4% NaHCO3 Curettage performed in three different directions: Yes    Electrodesiccation performed over the curetted area: Yes   Curettage cycles:  3 Final wound size (cm):  2.4 Hemostasis achieved with:  electrodesiccation Outcome: patient tolerated procedure well with no complications   Post-procedure details: wound care instructions given   Additional details:  Mupirocin and a pressure dressing applied  Left lateral calf  Will monitor left lateral calf for recurrence (unable to confirm location on exam today)  Basal cell carcinoma (BCC) of skin of left lower extremity including hip (2) Left inf knee lateral  Destruction of lesion  Destruction method: electrodesiccation and curettage   Informed consent: discussed and consent obtained   Timeout:  patient name, date of birth, surgical site, and procedure verified Patient was prepped and draped in usual sterile fashion: area prepped with isopropyl alcohol. Anesthesia: the lesion was anesthetized in a standard fashion   Anesthetic:  1% lidocaine w/ epinephrine 1-100,000 buffered w/ 8.4% NaHCO3 Curettage performed in three different directions: Yes   Electrodesiccation performed over the curetted area: Yes   Curettage cycles:  3 Final wound size (cm):  1.6 Hemostasis achieved with:  electrodesiccation Outcome: patient tolerated procedure well with no complications   Post-procedure details: wound care instructions given   Additional details:  Mupirocin and a pressure dressing applied  Left inf knee medial  Destruction of lesion  Destruction method: electrodesiccation and curettage   Informed consent: discussed and consent obtained   Timeout:  patient name, date of birth, surgical site, and procedure verified Patient was prepped and draped in usual sterile fashion: area prepped with isopropyl alcohol. Anesthesia: the lesion was  anesthetized in a standard fashion   Anesthetic:  1% lidocaine w/ epinephrine 1-100,000 buffered w/ 8.4% NaHCO3 Curettage performed in three different directions: Yes    Electrodesiccation performed over the curetted area: Yes   Curettage cycles:  3 Final wound size (cm):  1.4 Hemostasis achieved with:  electrodesiccation Outcome: patient tolerated procedure well with no complications   Post-procedure details: wound care instructions given   Additional details:  Mupirocin and a pressure dressing applied  Return in about 8 weeks (around 01/30/2021).  Graciella Belton, RMA, am acting as scribe for Forest Gleason, MD .  Documentation: I have reviewed the above documentation for accuracy and completeness, and I agree with the above.  Forest Gleason, MD

## 2020-12-06 ENCOUNTER — Telehealth (INDEPENDENT_AMBULATORY_CARE_PROVIDER_SITE_OTHER): Payer: Medicare PPO | Admitting: Internal Medicine

## 2020-12-06 ENCOUNTER — Other Ambulatory Visit: Payer: Self-pay

## 2020-12-06 ENCOUNTER — Encounter: Payer: Self-pay | Admitting: Internal Medicine

## 2020-12-06 DIAGNOSIS — I10 Essential (primary) hypertension: Secondary | ICD-10-CM

## 2020-12-06 DIAGNOSIS — R739 Hyperglycemia, unspecified: Secondary | ICD-10-CM

## 2020-12-06 DIAGNOSIS — K219 Gastro-esophageal reflux disease without esophagitis: Secondary | ICD-10-CM

## 2020-12-06 DIAGNOSIS — E7800 Pure hypercholesterolemia, unspecified: Secondary | ICD-10-CM

## 2020-12-06 DIAGNOSIS — F439 Reaction to severe stress, unspecified: Secondary | ICD-10-CM

## 2020-12-06 DIAGNOSIS — N3010 Interstitial cystitis (chronic) without hematuria: Secondary | ICD-10-CM | POA: Diagnosis not present

## 2020-12-06 DIAGNOSIS — I700 Atherosclerosis of aorta: Secondary | ICD-10-CM

## 2020-12-06 NOTE — Progress Notes (Signed)
[name], female   DOB: 06/26/1941, 80 y.o.   MRN: [id number]   Virtual Visit via video Note  This visit type was conducted due to national recommendations for restrictions regarding the COVID-19 pandemic (e.g. social distancing).  This format is felt to be most appropriate for this patient at this time.  All issues noted in this document were discussed and addressed.  No physical exam was performed (except for noted visual exam findings with Video Visits).   I connected with Marland Kitchen by a video enabled telemedicine application and verified that I am speaking with the correct person using two identifiers. Location patient: home Location provider: work  Persons participating in the virtual visit: patient, provider  The limitations, risks, security and privacy concerns of performing an evaluation and management service by video and the availability of in person appointments have been discussed.  It has also been discussed with the patient that there may be a patient responsible charge related to this service. The patient expressed understanding and agreed to proceed.   Reason for visit:  Scheduled follow up.   HPI: Follow up regarding his blood pressure, blood sugar and cholesterol.  She reports she is doing well.  Feels good.  Tries to stay active.  No chest pain or sob reported.  No abdominal pain or bowel change reported.  Handling stress.     ROS: See pertinent positives and negatives per HPI.  Past Medical History:  Diagnosis Date  . Basal cell carcinoma 06/21/2020   left post shoulder sup, left mid chest, left lat thigh  . BCC (basal cell carcinoma of skin) 03/30/2007   R inf med pretibial - BCC  . BCC (basal cell carcinoma of skin) 10/12/2013   L nasal ala - BCC  . BCC (basal cell carcinoma of skin) 03/10/2018   L mid dorsum med forearm - superficial BCC   . Breast screening, unspecified 2013  . Cancer ([facility]) 1992   skin  . Degenerative disk disease    . GERD (gastroesophageal reflux disease)   . History of basal cell carcinoma (BCC) 08/29/2020    left inferior knee lateral, , left inferior knee medial, right pretibia  . History of SCC (squamous cell carcinoma) of skin 08/29/2020   right posterior calf, left lateral calf, and   . Hypercholesterolemia 2008  . Interstitial cystitis    followed by Dr Jacqlyn Larsen  . Other sign and symptom in breast 2013   Left upper outer quadrant breast "soreness" Ultrasound exam of right breast in the 2 o'clock position with the breast distracted medially showed a 0.3-0.4 with 0.5 cm simple cyst. In the 1 o'clock position where pt reported tenderness US exam was negatiive.  Because of her history of intermittent nipple drainage, ultrasound was completed of the retroareolar area.  . Personal history of tobacco use, presenting hazards to health   . PONV (postoperative nausea and vomiting)   . SCC (squamous cell carcinoma) 03/28/2008   R dorsum hand - SCC  . SCC (squamous cell carcinoma) 05/09/2009   R lat lower leg - SCCIS  . SCC (squamous cell carcinoma) 05/09/2009   L lat lower leg - SCCIS  . SCC (squamous cell carcinoma) 04/07/2013   L lower leg - SCCIS  . SCC (squamous cell carcinoma) 04/27/2013   R forearm - SCC  . SCC (squamous cell carcinoma) 04/28/2017   L lat knee - SCCIS  . SCC (squamous cell carcinoma) 05/12/2018   L prox dorsum forearm -  SCC  . SCC (squamous cell carcinoma), leg, right 03/30/2007   R sup pretibial - SCCIS  . SCC (squamous cell carcinoma);BCC 10/12/2013   Mid back - superficial BCC with SCCIS   . Special screening for malignant neoplasms, colon   . 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   left dorsal forearm, left lat calf    Past Surgical History:  Procedure Laterality Date  . ABDOMINAL HYSTERECTOMY  1992  . APPENDECTOMY    . CERVICAL DISCECTOMY     S/P C7-T1 discectomy with fusion  . CHOLECYSTECTOMY  1990  . COLONOSCOPY WITH PROPOFOL N/A 02/14/2016   Procedure:  COLONOSCOPY WITH PROPOFOL;  Surgeon: Lucilla Lame, MD;  Location: [facility];  Service: Endoscopy;  Laterality: N/A;  PT WOULD LIKE 10 ARRIVAL TIME OR LATER  . DILATION AND CURETTAGE OF UTERUS    . ESOPHAGOGASTRODUODENOSCOPY (EGD) WITH PROPOFOL N/A 02/14/2016   Procedure: ESOPHAGOGASTRODUODENOSCOPY (EGD) WITH PROPOFOL with dialtion;  Surgeon: Lucilla Lame, MD;  Location: [facility];  Service: Endoscopy;  Laterality: N/A;  . ESOPHAGOGASTRODUODENOSCOPY (EGD) WITH PROPOFOL N/A 04/13/2019   Procedure: ESOPHAGOGASTRODUODENOSCOPY (EGD) WITH PROPOFOL;  Surgeon: Toledo, Benay Pike, MD;  Location: [facility];  Service: Gastroenterology;  Laterality: N/A;  . MELANOMA EXCISION     removed from Left calf 1994    Family History  Problem Relation Age of Onset  . Hodgkin's lymphoma Mother   . Heart failure Father   . Heart attack Father   . Arthritis Sister        Three sisters w/ degeneratve disk disease  . Headache Sister        Two sisters hx of headache  . Breast cancer Neg Hx   . Colon cancer Neg Hx   . Bladder Cancer Neg Hx   . Kidney cancer Neg Hx     SOCIAL HX: reviewed.    Current Outpatient Medications:  .  acetaminophen (TYLENOL) 325 MG tablet, Take 650 mg by mouth as needed., Disp: , Rfl:  .  busPIRone (BUSPAR) 5 MG tablet, Take 1 tablet (5 mg total) by mouth 2 (two) times daily., Disp: 180 tablet, Rfl: 1 .  esomeprazole (NEXIUM) 40 MG capsule, Take 1 capsule (40 mg total) by mouth daily., Disp: 90 capsule, Rfl: 3 .  famotidine (PEPCID) 40 MG tablet, Take 1 tablet (40 mg total) by mouth daily., Disp: 90 tablet, Rfl: 3 .  ibuprofen (ADVIL,MOTRIN) 200 MG tablet, Take 200 mg by mouth every 6 (six) hours as needed., Disp: , Rfl:  .  imipramine (TOFRANIL) 10 MG tablet, TAKE 1 TABLET BY MOUTH AT BEDTIME, Disp: 90 tablet, Rfl: 0 .  imiquimod (ALDARA) 5 % cream, Apply topically at bedtime. Start a thin layer to affected area at right leg 5 days a week for 6 weeks., Disp: 24  each, Rfl: 1 .  lovastatin (MEVACOR) 20 MG tablet, Take 1 tablet (20 mg total) by mouth at bedtime., Disp: 90 tablet, Rfl: 1 .  Melatonin 10 MG CAPS, Take by mouth as needed. , Disp: , Rfl:  .  Multiple Vitamin (MULTI-VITAMIN DAILY PO), Take 1 tablet by mouth daily., Disp: , Rfl:  .  mupirocin ointment (BACTROBAN) 2 %, Apply to affected area bid, Disp: 22 g, Rfl: 0 .  ondansetron (ZOFRAN-ODT) 4 MG disintegrating tablet, Take 1 tablet (4 mg total) by mouth every 8 (eight) hours as needed for nausea or vomiting., Disp: 20 tablet, Rfl: 0 .  telmisartan (MICARDIS) 20 MG tablet, Take 1 tablet (20 mg  total) by mouth daily., Disp: 90 tablet, Rfl: 1 .  trospium (SANCTURA) 20 MG tablet, Take 1 tablet (20 mg total) by mouth daily., Disp: 30 tablet, Rfl: 11  EXAM:  GENERAL: alert, oriented, appears well and in no acute distress  HEENT: atraumatic, conjunttiva clear, no obvious abnormalities on inspection of external nose and ears  NECK: normal movements of the head and neck  LUNGS: on inspection no signs of respiratory distress, breathing rate appears normal, no obvious gross SOB, gasping or wheezing  CV: no obvious cyanosis  PSYCH/NEURO: pleasant and cooperative, no obvious depression or anxiety, speech and thought processing grossly intact  ASSESSMENT AND PLAN:  Discussed the following assessment and plan:  Problem List Items Addressed This Visit    Aortic atherosclerosis ([place])    Continue mevacor.       Chronic interstitial cystitis    Followed by urology.       Essential hypertension    Continue micardis.  Follow pressures.  Follow metabolic panel.       GERD (gastroesophageal reflux disease)    No upper symptoms reported.  Continue nexium.       Hypercholesterolemia    Continue mevacor.  Low cholesterol diet and exercise.  Follow lipid panel and liver function tests.       Hyperglycemia    Low carb diet and exercise.  Follow met b and a1c.       Stress    Doing well on  buspar.  Stable.  Does not feel needs any further intervention.  Continue buspar.  Follow.           I discussed the assessment and treatment plan with the patient. The patient was provided an opportunity to ask questions and all were answered. The patient agreed with the plan and demonstrated an understanding of the instructions.   The patient was advised to call back or seek an in-person evaluation if the symptoms worsen or if the condition fails to improve as anticipated.   Einar Pheasant, MD

## 2020-12-08 ENCOUNTER — Encounter: Payer: Self-pay | Admitting: Internal Medicine

## 2020-12-08 NOTE — Assessment & Plan Note (Signed)
Low carb diet and exercise.  Follow met b and a1c.  

## 2020-12-08 NOTE — Assessment & Plan Note (Signed)
Continue mevacor.  Low cholesterol diet and exercise.  Follow lipid panel and liver function tests.  

## 2020-12-08 NOTE — Assessment & Plan Note (Signed)
Followed by urology.   

## 2020-12-08 NOTE — Assessment & Plan Note (Signed)
Continue mevacor °

## 2020-12-08 NOTE — Assessment & Plan Note (Signed)
No upper symptoms reported.  Continue nexium.  

## 2020-12-08 NOTE — Assessment & Plan Note (Signed)
Continue micardis.  Follow pressures.  Follow metabolic panel.  

## 2020-12-08 NOTE — Assessment & Plan Note (Signed)
Doing well on buspar.  Stable.  Does not feel needs any further intervention.  Continue buspar.  Follow.

## 2020-12-10 ENCOUNTER — Encounter: Payer: Self-pay | Admitting: Dermatology

## 2020-12-16 ENCOUNTER — Other Ambulatory Visit: Payer: Self-pay | Admitting: Internal Medicine

## 2020-12-27 ENCOUNTER — Telehealth: Payer: Self-pay | Admitting: Internal Medicine

## 2020-12-27 MED ORDER — TELMISARTAN 20 MG PO TABS: 20.0000 mg | ORAL_TABLET | Freq: Every day | ORAL | 0 refills | Status: DC

## 2020-12-27 NOTE — Telephone Encounter (Signed)
Patient called in for refiil for telmisartan (MICARDIS) 20 MG tablet she is out

## 2021-01-14 ENCOUNTER — Telehealth: Payer: Self-pay | Admitting: Internal Medicine

## 2021-01-14 NOTE — Telephone Encounter (Signed)
PT called in to state that she is breaking out in rashes that are itching bad from taking the telmisartan (MICARDIS) 20 MG tablet and states that she can not take this

## 2021-01-14 NOTE — Telephone Encounter (Signed)
Reviewed. Appears was getting better.  Please confirm how pt is doing now.  Confirm symptoms resolved.  Need to know how blood pressure is doing.  If elevated, will need to try another medication.

## 2021-01-14 NOTE — Telephone Encounter (Signed)
Called patient to confirm nothing more acute going on. No lip, facial, tongue swelling, no sob, etc. Rash and itching is better since stopping micardis. Advised patient to remain off of medication. Dr Nicki Reaper is not in the office but will need to discuss other treatment options if rash clears. Patient was ok with this plan. She will go to ED or urgent care if she starts having any new or worsening acute symptoms.

## 2021-01-16 NOTE — Telephone Encounter (Signed)
LMTCB

## 2021-01-22 ENCOUNTER — Encounter: Payer: Self-pay | Admitting: Dermatology

## 2021-01-22 ENCOUNTER — Other Ambulatory Visit: Payer: Self-pay

## 2021-01-22 ENCOUNTER — Ambulatory Visit: Payer: Medicare PPO | Admitting: Dermatology

## 2021-01-22 DIAGNOSIS — S81801A Unspecified open wound, right lower leg, initial encounter: Secondary | ICD-10-CM | POA: Diagnosis not present

## 2021-01-22 DIAGNOSIS — Z86007 Personal history of in-situ neoplasm of skin: Secondary | ICD-10-CM | POA: Diagnosis not present

## 2021-01-22 DIAGNOSIS — C44712 Basal cell carcinoma of skin of right lower limb, including hip: Secondary | ICD-10-CM

## 2021-01-22 DIAGNOSIS — T148XXA Other injury of unspecified body region, initial encounter: Secondary | ICD-10-CM

## 2021-01-22 DIAGNOSIS — Z85828 Personal history of other malignant neoplasm of skin: Secondary | ICD-10-CM | POA: Diagnosis not present

## 2021-01-22 MED ORDER — NIACINAMIDE 500 MG PO TABS: 500.0000 mg | ORAL_TABLET | Freq: Two times a day (BID) | ORAL | 11 refills | Status: DC

## 2021-01-22 MED ORDER — DOXYCYCLINE MONOHYDRATE 100 MG PO CAPS: 100.0000 mg | ORAL_CAPSULE | Freq: Two times a day (BID) | ORAL | 0 refills | Status: DC

## 2021-01-22 NOTE — Patient Instructions (Addendum)
Doxycycline should be taken with food to prevent nausea. Do not lay down for 30 minutes after taking. Be cautious with sun exposure and use good sun protection while on this medication. Pregnant women should not take this medication.   Take Doxycycline 100 mg 1 tablet twice a day with food and a full glass of water     Over the counter vitamin at the [facility]  Recommend Niacinamide or Nicotinamide 500mg  twice per day to lower risk of non-melanoma skin cancer by approximately 25%.

## 2021-01-22 NOTE — Progress Notes (Signed)
   Follow-Up Visit   Subjective  Rachael Austin is a 80 y.o. female who presents for the following: Follow-up (8 weeks f/u hx of SCCis at the L lateral calf, R posterior calf treated with EDC,  Hx of  superficial BCC at the left inferior knee, right pretibia treated with EDC).   The following portions of the chart were reviewed this encounter and updated as appropriate:   Tobacco  Allergies  Meds  Problems  Med Hx  Surg Hx  Fam Hx      Review of Systems:  No other skin or systemic complaints except as noted in HPI or Assessment and Plan.  Objective  Well appearing patient in no apparent distress; mood and affect are within normal limits.  A focused examination was performed including left leg, right leg. Relevant physical exam findings are noted in the Assessment and Plan.  Objective  Right posterior calf: Open wound within crusted papule within scar  Objective  left lateral calf: Well healed scar with no evidence of recurrence.   Objective  Left leg: Well healed scar with no evidence of recurrence.   Objective  Right Lower Leg - Anterior: Thin pink plaque   Assessment & Plan  Open wound Right posterior calf  Ddx includes recurrent SCC vs infection  Bacterial culture pending, biopsy proven SCCis site treated with [facility]  Start Doxycycline 100 mg take 1 tablet po bid x 2 weeks   We may consider re-biopsy if no better at next office visit   Doxycycline should be taken with food to prevent nausea. Do not lay down for 30 minutes after taking. Be cautious with sun exposure and use good sun protection while on this medication. Pregnant women should not take this medication.    Other Related Procedures Anaerobic and Aerobic Culture  Ordered Medications: doxycycline (MONODOX) 100 MG capsule  History of squamous cell carcinoma in situ (SCCIS) left lateral calf  Clear. Observe for recurrence. Call clinic for new or changing lesions.  Recommend regular skin  exams, daily broad-spectrum spf 30+ sunscreen use, and photoprotection.     Recommend Nicotinamide 500mg  twice per day to lower risk of non-melanoma skin cancer by approximately 25%.    Ordered Medications: niacinamide 500 MG tablet  History of basal cell carcinoma Left leg  Clear s/p ED&C. Observe for recurrence. Call clinic for new or changing lesions.  Recommend regular skin exams, daily broad-spectrum spf 30+ sunscreen use, and photoprotection.     Recommend Nicotinamide 500mg  twice per day to lower risk of non-melanoma skin cancer by approximately 25%.    Ordered Medications: niacinamide 500 MG tablet  Basal cell carcinoma (BCC) of pretibial region of right lower extremity Right Lower Leg - Anterior  Superficial  Once ED&C sites at other skin cancer locations are healed, plan treatment with imiquimod 5 times per week for 6 weeks. Not treated with ED&C at last visit per patient preference  Return in about 2 weeks (around 02/05/2021) for recheck biopsy sites .  I, Marye Round, CMA, am acting as scribe for Forest Gleason, MD .  Documentation: I have reviewed the above documentation for accuracy and completeness, and I agree with the above.  Forest Gleason, MD

## 2021-01-23 NOTE — Telephone Encounter (Signed)
BP is running 130-140s/60s-80s and itching has resolved. No rash. Do you want set her up to discuss other treatment options?

## 2021-01-23 NOTE — Telephone Encounter (Signed)
LMTCB

## 2021-01-23 NOTE — Telephone Encounter (Signed)
Yes.  Can schedule f/u appt to discuss blood pressure and treatment.

## 2021-01-24 NOTE — Telephone Encounter (Signed)
Pt scheduled  

## 2021-01-28 LAB — ANAEROBIC AND AEROBIC CULTURE

## 2021-01-28 LAB — SPECIMEN STATUS REPORT

## 2021-01-29 ENCOUNTER — Telehealth: Payer: Self-pay

## 2021-01-29 ENCOUNTER — Encounter: Payer: Self-pay | Admitting: Internal Medicine

## 2021-01-29 ENCOUNTER — Other Ambulatory Visit: Payer: Self-pay

## 2021-01-29 ENCOUNTER — Ambulatory Visit: Payer: Medicare PPO | Admitting: Internal Medicine

## 2021-01-29 DIAGNOSIS — F439 Reaction to severe stress, unspecified: Secondary | ICD-10-CM | POA: Diagnosis not present

## 2021-01-29 DIAGNOSIS — I10 Essential (primary) hypertension: Secondary | ICD-10-CM | POA: Diagnosis not present

## 2021-01-29 DIAGNOSIS — E7800 Pure hypercholesterolemia, unspecified: Secondary | ICD-10-CM

## 2021-01-29 DIAGNOSIS — K219 Gastro-esophageal reflux disease without esophagitis: Secondary | ICD-10-CM

## 2021-01-29 DIAGNOSIS — R739 Hyperglycemia, unspecified: Secondary | ICD-10-CM

## 2021-01-29 DIAGNOSIS — R55 Syncope and collapse: Secondary | ICD-10-CM | POA: Insufficient documentation

## 2021-01-29 DIAGNOSIS — I700 Atherosclerosis of aorta: Secondary | ICD-10-CM | POA: Diagnosis not present

## 2021-01-29 DIAGNOSIS — N3010 Interstitial cystitis (chronic) without hematuria: Secondary | ICD-10-CM

## 2021-01-29 LAB — CBC WITH DIFFERENTIAL/PLATELET
Basophils Absolute: 0 10*3/uL (ref 0.0–0.1)
Basophils Relative: 0.8 % (ref 0.0–3.0)
Eosinophils Absolute: 0 10*3/uL (ref 0.0–0.7)
Eosinophils Relative: 0.7 % (ref 0.0–5.0)
HCT: 37.2 % (ref 36.0–46.0)
Hemoglobin: 12.5 g/dL (ref 12.0–15.0)
Lymphocytes Relative: 26.3 % (ref 12.0–46.0)
Lymphs Abs: 1.6 10*3/uL (ref 0.7–4.0)
MCHC: 33.5 g/dL (ref 30.0–36.0)
MCV: 90.2 fl (ref 78.0–100.0)
Monocytes Absolute: 0.5 10*3/uL (ref 0.1–1.0)
Monocytes Relative: 9.1 % (ref 3.0–12.0)
Neutro Abs: 3.8 10*3/uL (ref 1.4–7.7)
Neutrophils Relative %: 63.1 % (ref 43.0–77.0)
Platelets: 233 10*3/uL (ref 150.0–400.0)
RBC: 4.13 Mil/uL (ref 3.87–5.11)
RDW: 13.8 % (ref 11.5–15.5)
WBC: 5.9 10*3/uL (ref 4.0–10.5)

## 2021-01-29 LAB — BASIC METABOLIC PANEL
BUN: 14 mg/dL (ref 6–23)
CO2: 28 mEq/L (ref 19–32)
Calcium: 9.2 mg/dL (ref 8.4–10.5)
Chloride: 102 mEq/L (ref 96–112)
Creatinine, Ser: 1.02 mg/dL (ref 0.40–1.20)
GFR: 51.76 mL/min — ABNORMAL LOW (ref >60.00)
Glucose, Bld: 112 mg/dL — ABNORMAL HIGH (ref 70–99)
Potassium: 4.2 mEq/L (ref 3.5–5.1)
Sodium: 137 mEq/L (ref 135–145)

## 2021-01-29 MED ORDER — HYDRALAZINE HCL 10 MG PO TABS
10.0000 mg | ORAL_TABLET | Freq: Three times a day (TID) | ORAL | 1 refills | Status: DC
Start: 2021-01-29 — End: 2021-03-06

## 2021-01-29 NOTE — Telephone Encounter (Signed)
Spoke with pt and informed her of results. She had no concerns.

## 2021-01-29 NOTE — Progress Notes (Signed)
[name], female   DOB: 1941/05/18, 80 y.o.   MRN: [id number]   Subjective:    [name], female    DOB: 07/17/1941, 80 y.o.   MRN: [id number]  HPI This visit occurred during the SARS-CoV-2 public health emergency.  Safety protocols were in place, including screening questions prior to the visit, additional usage of staff PPE, and extensive cleaning of exam room while observing appropriate contact time as indicated for disinfecting solutions.  Patient here for a scheduled follow up.  Here to follow up regarding her blood pressure.  Last visit, started on micardis. Developed itching.  Assumed to be due to micardis.  Stopped.  Itching stopped.  She reports she has been doing relatively well.  Has noticed episode when sitting - feels like she is going to pass out.  Has happened on a couple of occasions.  She has not actually passed out, but near syncope.  No chest pain.  Episodes may last 10-15 seconds.  Has not occurred while driving.  Does not occur during increased activity.  Breathing is stable.  Denies any increased heart rate or palpitations.  No nausea or vomiting. Eating.  Bowels moving.  Reviewed outside blood pressures - averaging 117-140/80-90.    Past Medical History:  Diagnosis Date  . Basal cell carcinoma 06/21/2020   left post shoulder sup, left mid chest, left lat thigh  . BCC (basal cell carcinoma of skin) 03/30/2007   R inf med pretibial - BCC  . BCC (basal cell carcinoma of skin) 10/12/2013   L nasal ala - BCC  . BCC (basal cell carcinoma of skin) 03/10/2018   L mid dorsum med forearm - superficial BCC   . Breast screening, unspecified 2013  . Cancer ([place]) 1992   skin  . Degenerative disk disease   . GERD (gastroesophageal reflux disease)   . History of basal cell carcinoma (BCC) 08/29/2020    left inferior knee lateral, , left inferior knee medial, right pretibia  . History of SCC (squamous cell carcinoma) of skin 08/29/2020    right posterior calf, left lateral calf, and   . Hypercholesterolemia 2008  . Interstitial cystitis    followed by Dr Jacqlyn Larsen  . Other sign and symptom in breast 2013   Left upper outer quadrant breast "soreness" Ultrasound exam of right breast in the 2 o'clock position with the breast distracted medially showed a 0.3-0.4 with 0.5 cm simple cyst. In the 1 o'clock position where pt reported tenderness US exam was negatiive.  Because of her history of intermittent nipple drainage, ultrasound was completed of the retroareolar area.  . Personal history of tobacco use, presenting hazards to health   . PONV (postoperative nausea and vomiting)   . SCC (squamous cell carcinoma) 03/28/2008   R dorsum hand - SCC  . SCC (squamous cell carcinoma) 05/09/2009   R lat lower leg - SCCIS  . SCC (squamous cell carcinoma) 05/09/2009   L lat lower leg - SCCIS  . SCC (squamous cell carcinoma) 04/07/2013   L lower leg - SCCIS  . SCC (squamous cell carcinoma) 04/27/2013   R forearm - SCC  . SCC (squamous cell carcinoma) 04/28/2017   L lat knee - SCCIS  . SCC (squamous cell carcinoma) 05/12/2018   L prox dorsum forearm - SCC  . SCC (squamous cell carcinoma), leg, right 03/30/2007   R sup pretibial - SCCIS  . SCC (squamous cell carcinoma);BCC 10/12/2013   Mid back - superficial  BCC with SCCIS   . Special screening for malignant neoplasms, colon   . 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   left dorsal forearm, left lat calf   Past Surgical History:  Procedure Laterality Date  . ABDOMINAL HYSTERECTOMY  1992  . APPENDECTOMY    . CERVICAL DISCECTOMY     S/P C7-T1 discectomy with fusion  . CHOLECYSTECTOMY  1990  . COLONOSCOPY WITH PROPOFOL N/A 02/14/2016   Procedure: COLONOSCOPY WITH PROPOFOL;  Surgeon: Lucilla Lame, MD;  Location: [facility];  Service: Endoscopy;  Laterality: N/A;  PT WOULD LIKE 10 ARRIVAL TIME OR LATER  . DILATION AND CURETTAGE OF UTERUS    . ESOPHAGOGASTRODUODENOSCOPY (EGD) WITH  PROPOFOL N/A 02/14/2016   Procedure: ESOPHAGOGASTRODUODENOSCOPY (EGD) WITH PROPOFOL with dialtion;  Surgeon: Lucilla Lame, MD;  Location: [facility];  Service: Endoscopy;  Laterality: N/A;  . ESOPHAGOGASTRODUODENOSCOPY (EGD) WITH PROPOFOL N/A 04/13/2019   Procedure: ESOPHAGOGASTRODUODENOSCOPY (EGD) WITH PROPOFOL;  Surgeon: Toledo, Benay Pike, MD;  Location: [facility];  Service: Gastroenterology;  Laterality: N/A;  . MELANOMA EXCISION     removed from Left calf 1994   Family History  Problem Relation Age of Onset  . Hodgkin's lymphoma Mother   . Heart failure Father   . Heart attack Father   . Arthritis Sister        Three sisters w/ degeneratve disk disease  . Headache Sister        Two sisters hx of headache  . Breast cancer Neg Hx   . Colon cancer Neg Hx   . Bladder Cancer Neg Hx   . Kidney cancer Neg Hx    Social History   Socioeconomic History  . Marital status: Widowed    Spouse name: Not on file  . Number of children: 2  . Years of education: Not on file  . Highest education level: Not on file  Occupational History  . Not on file  Tobacco Use  . Smoking status: Former Smoker    Packs/day: 1.00    Years: 15.00    Pack years: 15.00    Types: Cigarettes  . Smokeless tobacco: Never Used  Substance and Sexual Activity  . Alcohol use: No    Alcohol/week: 0.0 standard drinks  . Drug use: No  . Sexual activity: Not on file  Other Topics Concern  . Not on file  Social History Narrative   Married and has 2 children, daughters.   Social Determinants of Health   Financial Resource Strain: Not on file  Food Insecurity: Not on file  Transportation Needs: Not on file  Physical Activity: Not on file  Stress: Not on file  Social Connections: Not on file    Outpatient Encounter Medications as of 01/29/2021  Medication Sig  . acetaminophen (TYLENOL) 325 MG tablet Take 650 mg by mouth as needed.  . busPIRone (BUSPAR) 5 MG tablet Take 1 tablet (5 mg total) by  mouth 2 (two) times daily.  Marland Kitchen doxycycline (MONODOX) 100 MG capsule Take 1 capsule (100 mg total) by mouth 2 (two) times daily. Take 1 tablet twice a day with food  . esomeprazole (NEXIUM) 40 MG capsule Take 1 capsule (40 mg total) by mouth daily.  . famotidine (PEPCID) 40 MG tablet Take 1 tablet (40 mg total) by mouth daily.  . hydrALAZINE (APRESOLINE) 10 MG tablet Take 1 tablet (10 mg total) by mouth 3 (three) times daily.  Marland Kitchen ibuprofen (ADVIL,MOTRIN) 200 MG tablet Take 200 mg by mouth every 6 (six)  hours as needed.  Marland Kitchen imipramine (TOFRANIL) 10 MG tablet TAKE 1 TABLET BY MOUTH AT BEDTIME  . imiquimod (ALDARA) 5 % cream Apply topically at bedtime. Start a thin layer to affected area at right leg 5 days a week for 6 weeks.  . lovastatin (MEVACOR) 20 MG tablet Take 1 tablet (20 mg total) by mouth at bedtime.  . Melatonin 10 MG CAPS Take by mouth as needed.   . mupirocin ointment (BACTROBAN) 2 % Apply to affected area bid  . niacinamide 500 MG tablet Take 1 tablet (500 mg total) by mouth 2 (two) times daily with a meal.  . ondansetron (ZOFRAN-ODT) 4 MG disintegrating tablet Take 1 tablet (4 mg total) by mouth every 8 (eight) hours as needed for nausea or vomiting.  . trospium (SANCTURA) 20 MG tablet Take 1 tablet (20 mg total) by mouth daily.  . [DISCONTINUED] Multiple Vitamin (MULTI-VITAMIN DAILY PO) Take 1 tablet by mouth daily. (Patient not taking: Reported on 01/29/2021)  . [DISCONTINUED] telmisartan (MICARDIS) 20 MG tablet Take 1 tablet (20 mg total) by mouth daily. (Patient not taking: Reported on 01/29/2021)   No facility-administered encounter medications on file as of 01/29/2021.    Review of Systems  Constitutional: Negative for appetite change and unexpected weight change.  HENT: Negative for congestion and sinus pressure.   Respiratory: Negative for cough, chest tightness and shortness of breath.   Cardiovascular: Negative for chest pain, palpitations and leg swelling.  Gastrointestinal:  Negative for abdominal pain, diarrhea, nausea and vomiting.  Genitourinary: Negative for difficulty urinating and dysuria.  Musculoskeletal: Negative for joint swelling and myalgias.  Skin: Negative for color change and rash.  Neurological: Negative for dizziness, light-headedness and headaches.       Feeling like she is going to pass out as outlined.   Psychiatric/Behavioral: Negative for agitation and dysphoric mood.       Objective:    Physical Exam Vitals reviewed.  Constitutional:      General: She is not in acute distress.    Appearance: Normal appearance.  HENT:     Head: Normocephalic and atraumatic.     Right Ear: External ear normal.     Left Ear: External ear normal.  Eyes:     General: No scleral icterus.       Right eye: No discharge.     Conjunctiva/sclera: Conjunctivae normal.  Neck:     Thyroid: No thyromegaly.  Cardiovascular:     Rate and Rhythm: Normal rate and regular rhythm.  Pulmonary:     Effort: No respiratory distress.     Breath sounds: Normal breath sounds. No wheezing.  Abdominal:     General: Bowel sounds are normal.     Palpations: Abdomen is soft.     Tenderness: There is no abdominal tenderness.  Musculoskeletal:        General: No swelling or tenderness.     Cervical back: Neck supple. No tenderness.  Lymphadenopathy:     Cervical: No cervical adenopathy.  Skin:    Findings: No erythema or rash.  Neurological:     Mental Status: She is alert.  Psychiatric:        Mood and Affect: Mood normal.        Behavior: Behavior normal.     BP (!) 142/66 (BP Location: Left Arm, Patient Position: Sitting, Cuff Size: Normal)   Pulse 87   Temp 98.2 F (36.8 C)   Ht $R'5\' 4"'SU$  (1.626 m)   Wt 155 lb (70.3  kg)   LMP 08/09/2012   BMI 26.61 kg/m  Wt Readings from Last 3 Encounters:  01/29/21 155 lb (70.3 kg)  12/06/20 151 lb (68.5 kg)  10/17/20 151 lb 3.2 oz (68.6 kg)     Lab Results  Component Value Date   WBC 5.9 01/29/2021   HGB 12.5  01/29/2021   HCT 37.2 01/29/2021   PLT 233.0 01/29/2021   GLUCOSE 112 (H) 01/29/2021   CHOL 182 12/04/2020   TRIG 59.0 12/04/2020   HDL 82.50 12/04/2020   LDLCALC 87 12/04/2020   ALT 9 12/04/2020   AST 15 12/04/2020   NA 137 01/29/2021   K 4.2 01/29/2021   CL 102 01/29/2021   CREATININE 1.02 01/29/2021   BUN 14 01/29/2021   CO2 28 01/29/2021   TSH 3.90 12/04/2020   HGBA1C 5.8 12/04/2020    DG Bone Density  Result Date: 01/04/2020 EXAM: DUAL X-RAY ABSORPTIOMETRY (DXA) FOR BONE MINERAL DENSITY IMPRESSION: Your patient Minka Knight completed a BMD test on 01/04/2020 using the [place] (software version: 14.10) manufactured by [employer]. The following summarizes the results of our evaluation. Technologist: MTB PATIENT BIOGRAPHICAL: Name: Simone, Rodenbeck Patient ID: [id number] Birth Date: 1940/12/20 Height:     64.0 in. Gender: Female Exam Date: 01/04/2020 Weight:     149.0 lbs. Indications: Caucasian, Hysterectomy, Oophorectomy Bilateral, Postmenopausal Fractures: Treatments: Multi-Vitamin, Nexium, Vitamin D DENSITOMETRY RESULTS: Site         Region     Measured Date Measured Age WHO Classification Young Adult T-score BMD         %Change vs. Previous Significant Change (*) DualFemur Neck Right 01/04/2020 79.9 Osteoporosis -2.7 0.669 g/cm2 - - DualFemur Total Mean 01/04/2020 79.9 Osteopenia -2.3 0.720 g/cm2 - - Left Forearm Radius 33% 01/04/2020 79.9 Osteoporosis -3.1 0.602 g/cm2 - - ASSESSMENT: The BMD measured at Forearm Radius 33% is 0.602 g/cm2 with a T-score of -3.1. This patient is considered OSTEOPOROTIC according to [facility]) criteria. The scan quality is good. Lumbar spine was not utilized due to advanced degenerative changes. World Pharmacologist [facility]) criteria for post-menopausal, Caucasian Women: Normal:                   T-score at or above -1 SD Osteopenia/low bone mass: T-score between -1 and -2.5 SD Osteoporosis:              T-score at or below -2.5 SD RECOMMENDATIONS: 1. All patients should optimize calcium and vitamin D intake. 2. Consider FDA-approved medical therapies in postmenopausal women and men aged 37 years and older, based on the following: a. A hip or vertebral(clinical or morphometric) fracture b. T-score < -2.5 at the femoral neck or spine after appropriate evaluation to exclude secondary causes c. Low bone mass (T-score between -1.0 and -2.5 at the femoral neck or spine) and a 10-year probability of a hip fracture > 3% or a 10-year probability of a major osteoporosis-related fracture > 20% based on the [place]-adapted WHO algorithm 3. Clinician judgment and/or patient preferences may indicate treatment for people with 10-year fracture probabilities above or below these levels FOLLOW-UP: People with diagnosed cases of osteoporosis or at high risk for fracture should have regular bone mineral density tests. For patients eligible for Medicare, routine testing is allowed once every 2 years. The testing frequency can be increased to one year for patients who have rapidly progressing disease, those who are receiving or discontinuing medical therapy to restore bone mass,  or have additional risk factors. I have reviewed this report, and agree with the above findings. Mark A. Thornton Papas, M.D. [facility] Electronically Signed   By: Lavonia Dana M.D.   On: 01/04/2020 12:22   MM 3D SCREEN BREAST BILATERAL  Result Date: 01/04/2020 CLINICAL DATA:  Screening. EXAM: DIGITAL SCREENING BILATERAL MAMMOGRAM WITH TOMO AND CAD COMPARISON:  Previous exam(s). ACR Breast Density Category b: There are scattered areas of fibroglandular density. FINDINGS: There are no findings suspicious for malignancy. Images were processed with CAD. IMPRESSION: No mammographic evidence of malignancy. A result letter of this screening mammogram will be mailed directly to the patient. RECOMMENDATION: Screening mammogram in one year. (Code:SM-B-01Y) BI-RADS  CATEGORY  1: Negative. Electronically Signed   By: Lillia Mountain M.D.   On: 01/04/2020 12:38       Assessment & Plan:   Problem List Items Addressed This Visit    Aortic atherosclerosis (House)    Continue mevacor.       Relevant Medications   hydrALAZINE (APRESOLINE) 10 MG tablet   Chronic interstitial cystitis    Followed by urology.       Essential hypertension    Off micardis - caused itching.  Blood pressure elevated.  Had problems previously with amlodipine.  Given concern regarding presyncope, hold diuretic/beta blocker.  Start hydralazine $RemoveBeforeDEI'10mg'gCbcMLGovcuPhStR$  tid.  Follow pressures.  Follow metabolic panel.        Relevant Medications   hydrALAZINE (APRESOLINE) 10 MG tablet   GERD (gastroesophageal reflux disease)    No upper symptoms reported.  Continue nexium.       Hypercholesterolemia    Continue mevacor.  Low cholesterol diet and exercise.  Follow lipid panel and liver function tests.       Relevant Medications   hydrALAZINE (APRESOLINE) 10 MG tablet   Hyperglycemia    Low carb diet and exercise. Follow met b and a1c.       Near syncope    Noticed a couple of episodes recently as outlined.  Does not appear to occur with standing.  Denies increased heart rate or palpitations.  Blood pressure a little elevated, but no extremes.  EKG - SR with RBBB - no acute ischemic changes.  Given risk factors and recurring episode, will have cardiology evaluate with question of need for further cardiac w/up (for example, monitor, echo, etc).  Pt in agreement.  Recent carotid ultrasound 03/2020 - ok.       Relevant Medications   hydrALAZINE (APRESOLINE) 10 MG tablet   Other Relevant Orders   EKG 12-Lead (Completed)   CBC with Differential/Platelet (Completed)   Basic metabolic panel (Completed)   Ambulatory referral to Cardiology   Stress    Doing better.  Continue buspar.  Follow.           Einar Pheasant, MD

## 2021-01-29 NOTE — Telephone Encounter (Signed)
-----   Message from [place], MD sent at 01/29/2021 10:04 AM EDT ----- Culture showed small amount of normal skin bacteria --> No other treatment needed as long as she is doing ok. Would finish course of doxycycline and recheck at follow-up.  MAs please call. Thank you!

## 2021-02-03 ENCOUNTER — Encounter: Payer: Self-pay | Admitting: Internal Medicine

## 2021-02-03 NOTE — Assessment & Plan Note (Signed)
Continue mevacor °

## 2021-02-03 NOTE — Assessment & Plan Note (Signed)
Followed by urology.   

## 2021-02-03 NOTE — Assessment & Plan Note (Signed)
No upper symptoms reported.  Continue nexium.  

## 2021-02-03 NOTE — Assessment & Plan Note (Signed)
Low carb diet and exercise. Follow met b and a1c.  

## 2021-02-03 NOTE — Assessment & Plan Note (Signed)
Off micardis - caused itching.  Blood pressure elevated.  Had problems previously with amlodipine.  Given concern regarding presyncope, hold diuretic/beta blocker.  Start hydralazine 10mg  tid.  Follow pressures.  Follow metabolic panel.

## 2021-02-03 NOTE — Assessment & Plan Note (Signed)
Doing better.  Continue buspar.  Follow.

## 2021-02-03 NOTE — Assessment & Plan Note (Addendum)
Noticed a couple of episodes recently as outlined.  Does not appear to occur with standing.  Denies increased heart rate or palpitations.  Blood pressure a little elevated, but no extremes.  EKG - SR with RBBB - no acute ischemic changes.  Given risk factors and recurring episode, will have cardiology evaluate with question of need for further cardiac w/up (for example, monitor, echo, etc).  Pt in agreement.  Recent carotid ultrasound 03/2020 - ok.

## 2021-02-03 NOTE — Assessment & Plan Note (Signed)
Continue mevacor.  Low cholesterol diet and exercise.  Follow lipid panel and liver function tests.  

## 2021-02-14 ENCOUNTER — Other Ambulatory Visit: Payer: Self-pay

## 2021-02-14 ENCOUNTER — Ambulatory Visit: Payer: Medicare PPO | Admitting: Dermatology

## 2021-02-14 DIAGNOSIS — L821 Other seborrheic keratosis: Secondary | ICD-10-CM | POA: Diagnosis not present

## 2021-02-14 DIAGNOSIS — Z85828 Personal history of other malignant neoplasm of skin: Secondary | ICD-10-CM | POA: Diagnosis not present

## 2021-02-14 DIAGNOSIS — D492 Neoplasm of unspecified behavior of bone, soft tissue, and skin: Secondary | ICD-10-CM | POA: Diagnosis not present

## 2021-02-14 DIAGNOSIS — R21 Rash and other nonspecific skin eruption: Secondary | ICD-10-CM | POA: Diagnosis not present

## 2021-02-14 MED ORDER — TRIAMCINOLONE ACETONIDE 0.1 % EX CREA: 1.0000 "application " | TOPICAL_CREAM | Freq: Two times a day (BID) | CUTANEOUS | 2 refills | Status: DC | PRN

## 2021-02-14 NOTE — Patient Instructions (Addendum)
If you have any questions or concerns for your doctor, please call our main line at [phone] and press option 4 to reach your doctor's medical assistant. If no one answers, please leave a voicemail as directed and we will return your call as soon as possible. Messages left after 4 pm will be answered the following business day.   You may also send [place] a message via [place]. We typically respond to MyChart messages within 1-2 business days.  For prescription refills, please ask your pharmacy to contact our office. Our fax number is [phone].  If you have an urgent issue when the clinic is closed that cannot wait until the next business day, you can page your doctor at the number below.    Please note that while we do our best to be available for urgent issues outside of office hours, we are not available 24/7.   If you have an urgent issue and are unable to reach [place], you may choose to seek medical care at your doctor's office, retail clinic, urgent care center, or emergency room.  If you have a medical emergency, please immediately call 911 or go to the emergency department.  Pager Numbers  - Dr. Nehemiah Massed: [phone]  - Dr. Laurence Ferrari: [phone]  - Dr. Nicole Kindred: [phone]  In the event of inclement weather, please call our main line at [phone] for an update on the status of any delays or closures.  Dermatology Medication Tips: Please keep the boxes that topical medications come in in order to help keep track of the instructions about where and how to use these. Pharmacies typically print the medication instructions only on the boxes and not directly on the medication tubes.   If your medication is too expensive, please contact our office at [phone] option 4 or send [place] a message through [place].   We are unable to tell what your co-pay for medications will be in advance as this is different depending on your insurance coverage. However, we may be able to find a substitute  medication at lower cost or fill out paperwork to get insurance to cover a needed medication.   If a prior authorization is required to get your medication covered by your insurance company, please allow [place] 1-2 business days to complete this process.  Drug prices often vary depending on where the prescription is filled and some pharmacies may offer cheaper prices.  The website www.goodrx.com contains coupons for medications through different pharmacies. The prices here do not account for what the cost may be with help from insurance (it may be cheaper with your insurance), but the website can give you the price if you did not use any insurance.  - You can print the associated coupon and take it with your prescription to the pharmacy.  - You may also stop by our office during regular business hours and pick up a GoodRx coupon card.  - If you need your prescription sent electronically to a different pharmacy, notify our office through [facility] or by phone at [phone] option 4.  Topical steroids (such as triamcinolone, fluocinolone, fluocinonide, mometasone, clobetasol, halobetasol, betamethasone, hydrocortisone) can cause thinning and lightening of the skin if they are used for too long in the same area. Your physician has selected the right strength medicine for your problem and area affected on the body. Please use your medication only as directed by your physician to prevent side effects.

## 2021-02-14 NOTE — Progress Notes (Signed)
   Follow-Up Visit   Subjective  Rachael Austin is a 80 y.o. female who presents for the following: Wound Check (Posterior right leg. Improving per patient. HxBCC.) and Rash (Itching. Lower back. Dur: several days. Niacinamide caused itching. ).  The following portions of the chart were reviewed this encounter and updated as appropriate:  Tobacco  Allergies  Meds  Problems  Med Hx  Surg Hx  Fam Hx      Review of Systems: No other skin or systemic complaints except as noted in HPI or Assessment and Plan.   Objective  Well appearing patient in no apparent distress; mood and affect are within normal limits.  A focused examination was performed including face, back, right lower leg. Relevant physical exam findings are noted in the Assessment and Plan.  Objective  Right Lower Leg - Posterior: Well healing scar with no evidence of recurrence.   Objective  Left mid Back: ~2.5 cm pink scaly patch  Objective  Right mid Back: ~1.5 cm pink scaly patch  Objective  lower back: Scaly pink papules coalescing to plaques   Assessment & Plan  History of basal cell carcinoma (BCC) Right Lower Leg - Posterior  Continue wound care. No sign of recurrence.   Neoplasm of skin (2) Left mid Back  Right mid Back  Favor superficial BCCs.   Patient deferred biopsy at this time. Will return after summer.  Advised that I cannot confirm what these lesions are without a biopsy and it is possible for them to be something more aggressive than superficial BCC as well as for them to grow between now and follow-up. Advised to monitor closely and please call for any changes.   Rash lower back  Possibly secondary to niacinamide. She developed itch shortly after starting and it has improved since stopping.  Start triamcinolone cream twice a day as needed. Avoid applying to face, groin, and axilla. Use as directed. Risk of skin atrophy with long-term use reviewed.   Topical steroids (such  as triamcinolone, fluocinolone, fluocinonide, mometasone, clobetasol, halobetasol, betamethasone, hydrocortisone) can cause thinning and lightening of the skin if they are used for too long in the same area. Your physician has selected the right strength medicine for your problem and area affected on the body. Please use your medication only as directed by your physician to prevent side effects.    triamcinolone cream (KENALOG) 0.1 % - lower back  Seborrheic Keratoses back - Stuck-on, waxy, tan-brown papules and/or plaques  - Benign-appearing - Discussed benign etiology and prognosis. - Observe - Call for any changes   Return in about 2 months (around 04/16/2021) for Recheck back, biopsies.   I, Emelia Salisbury, CMA, am acting as scribe for Forest Gleason, MD.  Documentation: I have reviewed the above documentation for accuracy and completeness, and I agree with the above.  Forest Gleason, MD

## 2021-02-18 ENCOUNTER — Encounter: Payer: Self-pay | Admitting: Dermatology

## 2021-02-28 ENCOUNTER — Encounter: Payer: Self-pay | Admitting: Internal Medicine

## 2021-02-28 ENCOUNTER — Other Ambulatory Visit: Payer: Self-pay

## 2021-02-28 ENCOUNTER — Ambulatory Visit: Payer: Medicare PPO | Admitting: Internal Medicine

## 2021-03-05 ENCOUNTER — Other Ambulatory Visit: Payer: Self-pay | Admitting: Internal Medicine

## 2021-03-05 ENCOUNTER — Telehealth: Payer: Self-pay | Admitting: Internal Medicine

## 2021-03-05 ENCOUNTER — Other Ambulatory Visit: Payer: Self-pay | Admitting: Urology

## 2021-03-05 DIAGNOSIS — N3281 Overactive bladder: Secondary | ICD-10-CM

## 2021-03-05 NOTE — Telephone Encounter (Signed)
Nexium has been refilled

## 2021-03-05 NOTE — Telephone Encounter (Signed)
Patient called and is going out of town tomorrow. She needs her refill before leaving. She needs the following refill; esomeprazole (NEXIUM) 40 MG capsule.

## 2021-03-06 ENCOUNTER — Other Ambulatory Visit: Payer: Self-pay

## 2021-03-06 ENCOUNTER — Ambulatory Visit (INDEPENDENT_AMBULATORY_CARE_PROVIDER_SITE_OTHER): Payer: Medicare PPO | Admitting: Urology

## 2021-03-06 ENCOUNTER — Encounter: Payer: Self-pay | Admitting: Urology

## 2021-03-06 VITALS — BP 180/92 | HR 105

## 2021-03-06 DIAGNOSIS — N9089 Other specified noninflammatory disorders of vulva and perineum: Secondary | ICD-10-CM

## 2021-03-06 DIAGNOSIS — N3281 Overactive bladder: Secondary | ICD-10-CM | POA: Diagnosis not present

## 2021-03-06 DIAGNOSIS — N3010 Interstitial cystitis (chronic) without hematuria: Secondary | ICD-10-CM | POA: Diagnosis not present

## 2021-03-06 LAB — BLADDER SCAN AMB NON-IMAGING: Scan Result: 368

## 2021-03-06 MED ORDER — TROSPIUM CHLORIDE 20 MG PO TABS: 20.0000 mg | ORAL_TABLET | Freq: Every day | ORAL | 3 refills | Status: DC

## 2021-03-06 MED ORDER — IMIPRAMINE HCL 10 MG PO TABS: 10.0000 mg | ORAL_TABLET | Freq: Every day | ORAL | 3 refills | Status: DC

## 2021-03-06 NOTE — Progress Notes (Signed)
03/06/2021 1:18 PM   Rachael Austin 09/03/1941 [id number]  Referring provider: Einar Pheasant, MD [address] [place],  [place] [address]  Chief Complaint  Patient presents with   Over Active Bladder    HPI: 80 year old female with a personal history of IC and OAB returns today for routine annual follow-up.  In terms of IC, she is managed by dietary discretion and avoiding triggers as well as imipramine 10 mg.  She is requesting a refill of this today as she is traveling to her daughters for several weeks and will need to take a supply with her.  For her bladder overactivity, she is tried several medications including Myrbetriq and vesicare which caused itching and more recently trospium.  She continues to take this medication and believes that it is effective for her.  No side effects from this medication.  She also mentions today that she has some irritation of her left vulva.  She only has discomfort in that area when she wipes.  She would like to be examined.  She is previously using Desitin which helped but is no longer using this.  She like to know if she can resume it.  Initially, the patient was unable to void.  Bladder scan indicated that she had 368 cc in her bladder.  She then went to the bathroom with PVR of 105 cc.  Results for orders placed or performed in visit on 03/06/21  BLADDER SCAN AMB NON-IMAGING  Result Value Ref Range   Scan Result 368      PMH: Past Medical History:  Diagnosis Date   Basal cell carcinoma 06/21/2020   left post shoulder sup, left mid chest, left lat thigh   BCC (basal cell carcinoma of skin) 03/30/2007   R inf med pretibial - BCC   BCC (basal cell carcinoma of skin) 10/12/2013   L nasal ala - BCC   BCC (basal cell carcinoma of skin) 03/10/2018   L mid dorsum med forearm - superficial BCC    Breast screening, unspecified 2013   Cancer ([place]) 1992   skin   Degenerative disk disease    GERD  (gastroesophageal reflux disease)    History of basal cell carcinoma (BCC) 08/29/2020    left inferior knee lateral, , left inferior knee medial, right pretibia   History of SCC (squamous cell carcinoma) of skin 08/29/2020   right posterior calf, left lateral calf, and    Hypercholesterolemia 2008   Interstitial cystitis    followed by Dr Jacqlyn Larsen   Other sign and symptom in breast 2013   Left upper outer quadrant breast "soreness" Ultrasound exam of right breast in the 2 o'clock position with the breast distracted medially showed a 0.3-0.4 with 0.5 cm simple cyst. In the 1 o'clock position where pt reported tenderness US exam was negatiive.  Because of her history of intermittent nipple drainage, ultrasound was completed of the retroareolar area.   Personal history of tobacco use, presenting hazards to health    PONV (postoperative nausea and vomiting)    SCC (squamous cell carcinoma) 03/28/2008   R dorsum hand - SCC   SCC (squamous cell carcinoma) 05/09/2009   R lat lower leg - SCCIS   SCC (squamous cell carcinoma) 05/09/2009   L lat lower leg - SCCIS   SCC (squamous cell carcinoma) 04/07/2013   L lower leg - SCCIS   SCC (squamous cell carcinoma) 04/27/2013   R forearm - SCC   SCC (squamous cell carcinoma) 04/28/2017  L lat knee - SCCIS   SCC (squamous cell carcinoma) 05/12/2018   L prox dorsum forearm - SCC   SCC (squamous cell carcinoma), leg, right 03/30/2007   R sup pretibial - SCCIS   SCC (squamous cell carcinoma);BCC 10/12/2013   Mid back - superficial BCC with SCCIS    Special screening for malignant neoplasms, colon    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   left dorsal forearm, left lat calf    Surgical History: Past Surgical History:  Procedure Laterality Date   ABDOMINAL HYSTERECTOMY  1992   APPENDECTOMY     CERVICAL DISCECTOMY     S/P C7-T1 discectomy with fusion   CHOLECYSTECTOMY  1990   COLONOSCOPY WITH PROPOFOL N/A 02/14/2016   Procedure: COLONOSCOPY WITH  PROPOFOL;  Surgeon: Lucilla Lame, MD;  Location: [facility];  Service: Endoscopy;  Laterality: N/A;  PT WOULD LIKE 10 ARRIVAL TIME OR LATER   DILATION AND CURETTAGE OF UTERUS     ESOPHAGOGASTRODUODENOSCOPY (EGD) WITH PROPOFOL N/A 02/14/2016   Procedure: ESOPHAGOGASTRODUODENOSCOPY (EGD) WITH PROPOFOL with dialtion;  Surgeon: Lucilla Lame, MD;  Location: [facility];  Service: Endoscopy;  Laterality: N/A;   ESOPHAGOGASTRODUODENOSCOPY (EGD) WITH PROPOFOL N/A 04/13/2019   Procedure: ESOPHAGOGASTRODUODENOSCOPY (EGD) WITH PROPOFOL;  Surgeon: Toledo, Benay Pike, MD;  Location: [facility];  Service: Gastroenterology;  Laterality: N/A;   MELANOMA EXCISION     removed from Left calf 1994    Home Medications:  Allergies as of 03/06/2021       Reactions   Augmentin [amoxicillin-pot Clavulanate] Swelling, Rash   Swelling of the lips   Lexapro [escitalopram Oxalate]    Micardis [telmisartan] Itching   Myrbetriq [mirabegron] Itching   Niacin And Related Itching   Codeine Sulfate Other (See Comments)   "Makes her hyper"   Vesicare [solifenacin Succinate] Nausea And Vomiting, Rash        Medication List        Accurate as of March 06, 2021  1:18 PM. If you have any questions, ask your nurse or doctor.          STOP taking these medications    doxycycline 100 MG capsule Commonly known as: MONODOX Stopped by: Hollice Espy, MD   esomeprazole 40 MG capsule Commonly known as: Elaine by: Hollice Espy, MD   hydrALAZINE 10 MG tablet Commonly known as: APRESOLINE Stopped by: Hollice Espy, MD   niacinamide 500 MG tablet Stopped by: Hollice Espy, MD   ondansetron 4 MG disintegrating tablet Commonly known as: ZOFRAN-ODT Stopped by: Hollice Espy, MD       TAKE these medications    acetaminophen 325 MG tablet Commonly known as: TYLENOL Take 650 mg by mouth as needed.   busPIRone 5 MG tablet Commonly known as: BUSPAR Take 1 tablet (5 mg total) by  mouth 2 (two) times daily.   famotidine 40 MG tablet Commonly known as: Pepcid Take 1 tablet (40 mg total) by mouth daily.   ibuprofen 200 MG tablet Commonly known as: ADVIL Take 200 mg by mouth every 6 (six) hours as needed.   imipramine 10 MG tablet Commonly known as: TOFRANIL Take 1 tablet (10 mg total) by mouth at bedtime.   imiquimod 5 % cream Commonly known as: Aldara Apply topically at bedtime. Start a thin layer to affected area at right leg 5 days a week for 6 weeks.   lovastatin 20 MG tablet Commonly known as: MEVACOR Take 1 tablet (20 mg total) by mouth at bedtime.  Melatonin 10 MG Caps Take by mouth as needed.   mupirocin ointment 2 % Commonly known as: BACTROBAN Apply to affected area bid   triamcinolone cream 0.1 % Commonly known as: KENALOG Apply 1 application topically 2 (two) times daily as needed.   trospium 20 MG tablet Commonly known as: SANCTURA Take 1 tablet (20 mg total) by mouth daily.        Allergies:  Allergies  Allergen Reactions   Augmentin [Amoxicillin-Pot Clavulanate] Swelling and Rash    Swelling of the lips   Lexapro [Escitalopram Oxalate]    Micardis [Telmisartan] Itching   Myrbetriq [Mirabegron] Itching   Niacin And Related Itching   Codeine Sulfate Other (See Comments)    "Makes her hyper"   Vesicare [Solifenacin Succinate] Nausea And Vomiting and Rash    Family History: Family History  Problem Relation Age of Onset   Hodgkin's lymphoma Mother    Heart failure Father    Heart attack Father    Arthritis Sister        Three sisters w/ degeneratve disk disease   Headache Sister        Two sisters hx of headache   Breast cancer Neg Hx    Colon cancer Neg Hx    Bladder Cancer Neg Hx    Kidney cancer Neg Hx     Social History:  reports that she has quit smoking. Her smoking use included cigarettes. She has a 15.00 pack-year smoking history. She has never used smokeless tobacco. She reports that she does not drink  alcohol and does not use drugs.   Physical Exam: BP (!) 180/92   Pulse (!) 105   LMP 08/09/2012   Constitutional:  Alert and oriented, No acute distress. HEENT: [place] AT, moist mucus membranes.  Trachea midline, no masses. Cardiovascular: No clubbing, cyanosis, or edema. Respiratory: Normal respiratory effort, no increased work of breathing. GI: Abdomen is soft, nontender, nondistended, no abdominal masses Pelvic: Chaperoned by CMA, obese.  Normal external genitalia with normal urethral meatus and vaginal mucosa without discharge.  There is no labial excoriations, rashes, lesions, or any other findings on exam to explain her left vulvar irritation. Skin: No rashes, bruises or suspicious lesions. Neurologic: Grossly intact, no focal deficits, moving all 4 extremities. Psychiatric: Normal mood and affect.  Laboratory Data: Lab Results  Component Value Date   WBC 5.9 01/29/2021   HGB 12.5 01/29/2021   HCT 37.2 01/29/2021   MCV 90.2 01/29/2021   PLT 233.0 01/29/2021    Lab Results  Component Value Date   CREATININE 1.02 01/29/2021   Lab Results  Component Value Date   HGBA1C 5.8 12/04/2020     Assessment & Plan:    1. OAB (overactive bladder) Initially bladder scan was worrisome for incomplete emptying, however she was able to demonstrate more adequate emptying  As such, we will continue on [place] which has improved her irritative urinary symptoms including overactivity and incontinence - BLADDER SCAN AMB NON-IMAGING - trospium (SANCTURA) 20 MG tablet; Take 1 tablet (20 mg total) by mouth daily.  Dispense: 90 tablet; Refill: 3 - imipramine (TOFRANIL) 10 MG tablet; Take 1 tablet (10 mg total) by mouth at bedtime.  Dispense: 90 tablet; Refill: 3  2. Interstitial cystitis Continue behavioral modification and  Longstanding history of imipramine 10 mg which is her symptoms well controlled, will continue this medication  3. Labial irritation Pelvic exam shows no excoriation  ulceration or any other exam findings other than generalized atrophy  She has  previously done well with Desitin as a barrier cream to help protect her skin from irritation from her pad, may benefit from resuming this  Follow-up in 1 year with a PVR  Hollice Espy, MD  [facility] [address], [place], [place] [address] [phone]

## 2021-03-07 ENCOUNTER — Ambulatory Visit: Payer: Medicare PPO | Admitting: Internal Medicine

## 2021-03-08 ENCOUNTER — Encounter: Payer: Self-pay | Admitting: Internal Medicine

## 2021-03-08 NOTE — Assessment & Plan Note (Signed)
On hydralazine.  Schedule f/u - f/u for blood pressure.

## 2021-03-08 NOTE — Progress Notes (Signed)
Pt not evaluated.  Appt changed - 03/22/21.

## 2021-03-17 NOTE — Progress Notes (Signed)
Cardiology Office Note  Date:  03/18/2021   ID:  Rachael Austin, Rachael Austin 08-15-1941, MRN [id number]  PCP:  Einar Pheasant, MD   Chief Complaint  Patient presents with   New Patient (Initial Visit)    Ref by Einar Pheasant, MD for pre-syncope. Medications reviewed by the patient verbally. Patient c/o one spell of pre-syncope, chest pain in the mid-sternum, had a spell while walking down the beach last week with a weird sensation in her chest and legs gave out.      HPI:  Rachael Austin is a 80 year old woman with past medical history of Hypertension Hyperlipidemia Referred by Dr. Einar Pheasant for near syncope, aortic atherosclerosis, chest pain  Discussed recent events, Reports having 1 episode of chest pain lasting several seconds, was standing at the kitchen sink Mediastinal discomfort, resolved on its own, no further episodes since that time  Walks daily Has been at the beach, no chest pain  Last week, walking at the beach 1 hr at the beach in the hot sun Difficulty getting back home, on the sand, very hot,  "Forced herself to the chair" Drank water in the shade Has felt fine since then  Several medications give her itching My card is on list of intolerances Still with itching Feels it started after BP medication or anxiety medication  Also reports having some episodes of lightheadedness, near syncope Has noticed episode when sitting - feels like she is going to pass out.  Has happened on a couple of occasions.    Episodes may last 10-15 seconds.  Has not occurred while driving.  Does not occur during increased activity.     EKG personally reviewed by myself on todays visit Normal sinus rhythm rate 81 bpm right bundle branch block  PMH:   has a past medical history of Basal cell carcinoma (06/21/2020), BCC (basal cell carcinoma of skin) (03/30/2007), BCC (basal cell carcinoma of skin) (10/12/2013), BCC (basal cell carcinoma of skin) (03/10/2018), Breast screening,  unspecified (2013), Cancer ([place]) (1992), Degenerative disk disease, GERD (gastroesophageal reflux disease), History of basal cell carcinoma (BCC) (08/29/2020), History of SCC (squamous cell carcinoma) of skin (08/29/2020), Hypercholesterolemia (2008), Interstitial cystitis, Other sign and symptom in breast (2013), Personal history of tobacco use, presenting hazards to health, PONV (postoperative nausea and vomiting), SCC (squamous cell carcinoma) (03/28/2008), SCC (squamous cell carcinoma) (05/09/2009), SCC (squamous cell carcinoma) (05/09/2009), SCC (squamous cell carcinoma) (04/07/2013), SCC (squamous cell carcinoma) (04/27/2013), SCC (squamous cell carcinoma) (04/28/2017), SCC (squamous cell carcinoma) (05/12/2018), SCC (squamous cell carcinoma), leg, right (03/30/2007), SCC (squamous cell carcinoma);BCC (10/12/2013), Special screening for malignant neoplasms, colon, and Squamous cell carcinoma in situ (06/21/2020).  PSH:    Past Surgical History:  Procedure Laterality Date   ABDOMINAL HYSTERECTOMY  1992   APPENDECTOMY     CERVICAL DISCECTOMY     S/P C7-T1 discectomy with fusion   CHOLECYSTECTOMY  1990   COLONOSCOPY WITH PROPOFOL N/A 02/14/2016   Procedure: COLONOSCOPY WITH PROPOFOL;  Surgeon: Lucilla Lame, MD;  Location: [facility];  Service: Endoscopy;  Laterality: N/A;  PT WOULD LIKE 10 ARRIVAL TIME OR LATER   DILATION AND CURETTAGE OF UTERUS     ESOPHAGOGASTRODUODENOSCOPY (EGD) WITH PROPOFOL N/A 02/14/2016   Procedure: ESOPHAGOGASTRODUODENOSCOPY (EGD) WITH PROPOFOL with dialtion;  Surgeon: Lucilla Lame, MD;  Location: [facility];  Service: Endoscopy;  Laterality: N/A;   ESOPHAGOGASTRODUODENOSCOPY (EGD) WITH PROPOFOL N/A 04/13/2019   Procedure: ESOPHAGOGASTRODUODENOSCOPY (EGD) WITH PROPOFOL;  Surgeon: Toledo, Benay Pike, MD;  Location: [facility];  Service: Gastroenterology;  Laterality: N/A;   MELANOMA EXCISION     removed from Left calf 1994    Current Outpatient  Medications  Medication Sig Dispense Refill   acetaminophen (TYLENOL) 325 MG tablet Take 650 mg by mouth as needed.     busPIRone (BUSPAR) 5 MG tablet Take 1 tablet (5 mg total) by mouth 2 (two) times daily. 180 tablet 1   esomeprazole (NEXIUM) 40 MG packet Take 40 mg by mouth daily before breakfast.     hydrALAZINE (APRESOLINE) 10 MG tablet Take 10 mg by mouth 3 (three) times daily.     ibuprofen (ADVIL,MOTRIN) 200 MG tablet Take 200 mg by mouth every 6 (six) hours as needed.     imipramine (TOFRANIL) 10 MG tablet Take 1 tablet (10 mg total) by mouth at bedtime. 90 tablet 3   lovastatin (MEVACOR) 20 MG tablet Take 1 tablet (20 mg total) by mouth at bedtime. 90 tablet 1   Melatonin 10 MG CAPS Take by mouth as needed.      mupirocin ointment (BACTROBAN) 2 % Apply to affected area bid 22 g 0   triamcinolone cream (KENALOG) 0.1 % Apply 1 application topically 2 (two) times daily as needed. 80 g 2   trospium (SANCTURA) 20 MG tablet Take 1 tablet (20 mg total) by mouth daily. 90 tablet 3   No current facility-administered medications for this visit.     Allergies:   Augmentin [amoxicillin-pot clavulanate], Lexapro [escitalopram oxalate], Micardis [telmisartan], Myrbetriq [mirabegron], Niacin and related, Codeine sulfate, and Vesicare [solifenacin succinate]   Social History:  The patient  reports that she has quit smoking. Her smoking use included cigarettes. She has a 15.00 pack-year smoking history. She has never used smokeless tobacco. She reports that she does not drink alcohol and does not use drugs.   Family History:   family history includes Arthritis in her sister; Headache in her sister; Heart attack in her father; Heart failure in her father; Hodgkin's lymphoma in her mother.    Review of Systems: Review of Systems  Constitutional: Negative.   HENT: Negative.    Respiratory: Negative.    Cardiovascular: Negative.   Gastrointestinal: Negative.   Musculoskeletal: Negative.    Neurological: Negative.   Psychiatric/Behavioral: Negative.    All other systems reviewed and are negative.   PHYSICAL EXAM: VS:  BP (!) 150/78 (BP Location: Right Arm, Patient Position: Sitting, Cuff Size: Normal)   Pulse 81   Ht 5' 4.5" (1.638 m)   Wt 153 lb 4 oz (69.5 kg)   LMP 08/09/2012   SpO2 98%   BMI 25.90 kg/m  , BMI Body mass index is 25.9 kg/m. GEN: Well nourished, well developed, in no acute distress HEENT: normal Neck: no JVD, carotid bruits, or masses Cardiac: RRR; no murmurs, rubs, or gallops,no edema  Respiratory:  clear to auscultation bilaterally, normal work of breathing GI: soft, nontender, nondistended, + BS MS: no deformity or atrophy Skin: warm and dry, no rash Neuro:  Strength and sensation are intact Psych: euthymic mood, full affect    Recent Labs: 12/04/2020: ALT 9; TSH 3.90 01/29/2021: BUN 14; Creatinine, Ser 1.02; Hemoglobin 12.5; Platelets 233.0; Potassium 4.2; Sodium 137    Lipid Panel Lab Results  Component Value Date   CHOL 182 12/04/2020   HDL 82.50 12/04/2020   LDLCALC 87 12/04/2020   TRIG 59.0 12/04/2020      Wt Readings from Last 3 Encounters:  03/18/21 153 lb 4 oz (69.5 kg)  02/28/21 154  lb 12.8 oz (70.2 kg)  01/29/21 155 lb (70.3 kg)       ASSESSMENT AND PLAN:  Problem List Items Addressed This Visit       Cardiology Problems   Aortic atherosclerosis (HCC) - Primary   Relevant Medications   hydrALAZINE (APRESOLINE) 10 MG tablet   Near syncope   Relevant Medications   hydrALAZINE (APRESOLINE) 10 MG tablet   Near syncope Less likely blood pressure issue as it happens from a sitting position, blood pressure is not low Unable to exclude arrhythmia Options discussed with her, we have ordered a ZIO monitor to rule out tachyarrhythmia  Aortic atherosclerosis CT scan images pulled up and reviewed with her in detail, mild aortic atherosclerosis no significant coronary calcification Continue lovastatin  Chest  pain Atypical in nature Recommended if she has recurrent episodes that she call our office for further evaluation  Essential hypertension She is concerned blood pressure medication could be giving her a rash Blood pressure mildly elevated typically 135 up to low 140s at home No changes made Potentially could change to amlodipine 5 if hydralazine causing a rash Will avoid going up on the hydralazine in case this is a intolerance No urgency to her blood pressure   Total encounter time more than 60 minutes  Greater than 50% was spent in counseling and coordination of care with the patient    Signed, Esmond Plants, M.D., Ph.D. [place], [place]

## 2021-03-18 ENCOUNTER — Other Ambulatory Visit: Payer: Self-pay

## 2021-03-18 ENCOUNTER — Ambulatory Visit: Payer: Medicare PPO | Admitting: Cardiovascular Disease

## 2021-03-18 ENCOUNTER — Ambulatory Visit (INDEPENDENT_AMBULATORY_CARE_PROVIDER_SITE_OTHER): Payer: Medicare PPO

## 2021-03-18 ENCOUNTER — Encounter: Payer: Self-pay | Admitting: Cardiovascular Disease

## 2021-03-18 VITALS — BP 150/78 | HR 81 | Ht 64.5 in | Wt 153.2 lb

## 2021-03-18 DIAGNOSIS — R002 Palpitations: Secondary | ICD-10-CM

## 2021-03-18 DIAGNOSIS — R55 Syncope and collapse: Secondary | ICD-10-CM | POA: Diagnosis not present

## 2021-03-18 DIAGNOSIS — I700 Atherosclerosis of aorta: Secondary | ICD-10-CM

## 2021-03-18 NOTE — Patient Instructions (Addendum)
Medication Instructions:  No changes  If you need a refill on your cardiac medications before your next appointment, please call your pharmacy.    Lab work: No new labs needed   If you have labs (blood work) drawn today and your tests are completely normal, you will receive your results only by: [facility] (if you have MyChart) OR A paper copy in the mail If you have any lab test that is abnormal or we need to change your treatment, we will call you to review the results.   Testing/Procedures:  Your physician has recommended that you wear a Zio XT monitor x 14 days (placed in office today)  This monitor is a medical device that records the heart's electrical activity. Doctors most often use these monitors to diagnose arrhythmias. Arrhythmias are problems with the speed or rhythm of the heartbeat. The monitor is a small device applied to your chest. You can wear one while you do your normal daily activities. While wearing this monitor if you have any symptoms to push the button and record what you felt. Once you have worn this monitor for the period of time provider prescribed (Usually 14 days), you will return the monitor device in the postage paid box. Once it is returned they will download the data collected and provide [place] with a report which the provider will then review and we will call you with those results. Important tips:  Avoid showering during the first 24 hours of wearing the monitor. Avoid excessive sweating to help maximize wear time. Do not submerge the device, no hot tubs, and no swimming pools. Keep any lotions or oils away from the patch. After 24 hours you may shower with the patch on. Take brief showers with your back facing the shower head.  Do not remove patch once it has been placed because that will interrupt data and decrease adhesive wear time. Push the button when you have any symptoms and write down what you were feeling. Once you have completed wearing  your monitor, remove and place into box which has postage paid and place in your outgoing mailbox.  If for some reason you have misplaced your box then call our office and we can provide another box and/or mail it off for you.      Follow-Up: At [facility], you and your health needs are our priority.  As part of our continuing mission to provide you with exceptional heart care, we have created designated Provider Care Teams.  These Care Teams include your primary Cardiologist (physician) and Advanced Practice Providers (APPs -  Physician Assistants and Nurse Practitioners) who all work together to provide you with the care you need, when you need it.  You will need a follow up appointment as needed  Providers on your designated Care Team:   Murray Hodgkins, NP Christell Faith, PA-C Marrianne Mood, PA-C  Any Other Special Instructions Will Be Listed Below (If Applicable).  COVID-19 Vaccine Information can be found at: [email or web address] For questions related to vaccine distribution or appointments, please email [email or web address] or call [phone].

## 2021-03-22 ENCOUNTER — Other Ambulatory Visit: Payer: Self-pay

## 2021-03-22 ENCOUNTER — Encounter: Payer: Self-pay | Admitting: Internal Medicine

## 2021-03-22 ENCOUNTER — Ambulatory Visit: Payer: Medicare PPO | Admitting: Internal Medicine

## 2021-03-22 DIAGNOSIS — E7800 Pure hypercholesterolemia, unspecified: Secondary | ICD-10-CM

## 2021-03-22 DIAGNOSIS — R55 Syncope and collapse: Secondary | ICD-10-CM | POA: Diagnosis not present

## 2021-03-22 DIAGNOSIS — Z1231 Encounter for screening mammogram for malignant neoplasm of breast: Secondary | ICD-10-CM

## 2021-03-22 DIAGNOSIS — I10 Essential (primary) hypertension: Secondary | ICD-10-CM | POA: Diagnosis not present

## 2021-03-22 DIAGNOSIS — I700 Atherosclerosis of aorta: Secondary | ICD-10-CM | POA: Diagnosis not present

## 2021-03-22 DIAGNOSIS — R739 Hyperglycemia, unspecified: Secondary | ICD-10-CM

## 2021-03-22 DIAGNOSIS — F439 Reaction to severe stress, unspecified: Secondary | ICD-10-CM

## 2021-03-22 DIAGNOSIS — N3010 Interstitial cystitis (chronic) without hematuria: Secondary | ICD-10-CM

## 2021-03-22 DIAGNOSIS — L989 Disorder of the skin and subcutaneous tissue, unspecified: Secondary | ICD-10-CM

## 2021-03-22 DIAGNOSIS — K219 Gastro-esophageal reflux disease without esophagitis: Secondary | ICD-10-CM | POA: Diagnosis not present

## 2021-03-22 MED ORDER — HYDRALAZINE HCL 25 MG PO TABS: 25.0000 mg | ORAL_TABLET | Freq: Two times a day (BID) | ORAL | 2 refills | Status: DC

## 2021-03-22 NOTE — Progress Notes (Addendum)
[name], female   DOB: May 17, 1941, 80 y.o.   MRN: [id number]   Subjective:    [name], female    DOB: 15-Nov-1940, 80 y.o.   MRN: [id number]  HPI This visit occurred during the SARS-CoV-2 public health emergency.  Safety protocols were in place, including screening questions prior to the visit, additional usage of staff PPE, and extensive cleaning of exam room while observing appropriate contact time as indicated for disinfecting solutions.   Patient here for a scheduled follow up. Here to follow up regarding her blood pressure.  Started on hydralazine last visit.  Tolerating.  Saw cardiology recently.  Concerned initially was causing rash.  Believes now her rash was coming from shampoo.  No rash the last couple of days (since stopping).  Blood pressure elevated - outside checks 269S-854O systolic readings.  Has zio monitor.  No chest pain.  No increased heart rate.  Breathing stable.  No acid reflux.  On sanctura and imipramine - helping.  Followed by urology.     Past Medical History:  Diagnosis Date   Basal cell carcinoma 06/21/2020   left post shoulder sup, left mid chest, left lat thigh   BCC (basal cell carcinoma of skin) 03/30/2007   R inf med pretibial - BCC   BCC (basal cell carcinoma of skin) 10/12/2013   L nasal ala - BCC   BCC (basal cell carcinoma of skin) 03/10/2018   L mid dorsum med forearm - superficial BCC    Breast screening, unspecified 2013   Cancer ([place]) 1992   skin   Degenerative disk disease    GERD (gastroesophageal reflux disease)    History of basal cell carcinoma (BCC) 08/29/2020    left inferior knee lateral, , left inferior knee medial, right pretibia   History of SCC (squamous cell carcinoma) of skin 08/29/2020   right posterior calf, left lateral calf, and    Hypercholesterolemia 2008   Interstitial cystitis    followed by Dr Jacqlyn Larsen   Other sign and symptom in breast 2013   Left upper outer quadrant breast  "soreness" Ultrasound exam of right breast in the 2 o'clock position with the breast distracted medially showed a 0.3-0.4 with 0.5 cm simple cyst. In the 1 o'clock position where pt reported tenderness US exam was negatiive.  Because of her history of intermittent nipple drainage, ultrasound was completed of the retroareolar area.   Personal history of tobacco use, presenting hazards to health    PONV (postoperative nausea and vomiting)    SCC (squamous cell carcinoma) 03/28/2008   R dorsum hand - SCC   SCC (squamous cell carcinoma) 05/09/2009   R lat lower leg - SCCIS   SCC (squamous cell carcinoma) 05/09/2009   L lat lower leg - SCCIS   SCC (squamous cell carcinoma) 04/07/2013   L lower leg - SCCIS   SCC (squamous cell carcinoma) 04/27/2013   R forearm - SCC   SCC (squamous cell carcinoma) 04/28/2017   L lat knee - SCCIS   SCC (squamous cell carcinoma) 05/12/2018   L prox dorsum forearm - SCC   SCC (squamous cell carcinoma), leg, right 03/30/2007   R sup pretibial - SCCIS   SCC (squamous cell carcinoma);BCC 10/12/2013   Mid back - superficial BCC with SCCIS    Special screening for malignant neoplasms, colon    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   left dorsal forearm, left lat calf   Past Surgical History:  Procedure Laterality Date   ABDOMINAL HYSTERECTOMY  1992   APPENDECTOMY     CERVICAL DISCECTOMY     S/P C7-T1 discectomy with fusion   CHOLECYSTECTOMY  1990   COLONOSCOPY WITH PROPOFOL N/A 02/14/2016   Procedure: COLONOSCOPY WITH PROPOFOL;  Surgeon: Lucilla Lame, MD;  Location: [facility];  Service: Endoscopy;  Laterality: N/A;  PT WOULD LIKE 10 ARRIVAL TIME OR LATER   DILATION AND CURETTAGE OF UTERUS     ESOPHAGOGASTRODUODENOSCOPY (EGD) WITH PROPOFOL N/A 02/14/2016   Procedure: ESOPHAGOGASTRODUODENOSCOPY (EGD) WITH PROPOFOL with dialtion;  Surgeon: Lucilla Lame, MD;  Location: [facility];  Service: Endoscopy;  Laterality: N/A;    ESOPHAGOGASTRODUODENOSCOPY (EGD) WITH PROPOFOL N/A 04/13/2019   Procedure: ESOPHAGOGASTRODUODENOSCOPY (EGD) WITH PROPOFOL;  Surgeon: Toledo, Benay Pike, MD;  Location: [facility];  Service: Gastroenterology;  Laterality: N/A;   MELANOMA EXCISION     removed from Left calf 1994   Family History  Problem Relation Age of Onset   Hodgkin's lymphoma Mother    Heart failure Father    Heart attack Father    Arthritis Sister        Three sisters w/ degeneratve disk disease   Headache Sister        Two sisters hx of headache   Breast cancer Neg Hx    Colon cancer Neg Hx    Bladder Cancer Neg Hx    Kidney cancer Neg Hx    Social History   Socioeconomic History   Marital status: Widowed    Spouse name: Not on file   Number of children: 2   Years of education: Not on file   Highest education level: Not on file  Occupational History   Not on file  Tobacco Use   Smoking status: Former    Packs/day: 1.00    Years: 15.00    Pack years: 15.00    Types: Cigarettes   Smokeless tobacco: Never  Vaping Use   Vaping Use: Never used  Substance and Sexual Activity   Alcohol use: No    Alcohol/week: 0.0 standard drinks   Drug use: No   Sexual activity: Not on file  Other Topics Concern   Not on file  Social History Narrative   Married and has 2 children, daughters.   Social Determinants of Health   Financial Resource Strain: Not on file  Food Insecurity: Not on file  Transportation Needs: Not on file  Physical Activity: Not on file  Stress: Not on file  Social Connections: Not on file    Review of Systems  Constitutional:  Negative for appetite change and unexpected weight change.  HENT:  Negative for congestion and sinus pressure.   Respiratory:  Negative for cough, chest tightness and shortness of breath.   Cardiovascular:  Negative for chest pain, palpitations and leg swelling.  Gastrointestinal:  Negative for abdominal pain, diarrhea, nausea and vomiting.  Genitourinary:   Negative for difficulty urinating and dysuria.  Musculoskeletal:  Negative for joint swelling and myalgias.  Skin:  Negative for color change and rash.  Neurological:  Negative for dizziness, light-headedness and headaches.  Psychiatric/Behavioral:  Negative for agitation and dysphoric mood.       Objective:    Physical Exam Vitals reviewed.  Constitutional:      General: She is not in acute distress.    Appearance: Normal appearance.  HENT:     Head: Normocephalic and atraumatic.     Right Ear: External ear normal.     Left Ear:  External ear normal.  Eyes:     General: No scleral icterus.       Right eye: No discharge.        Left eye: No discharge.     Conjunctiva/sclera: Conjunctivae normal.  Neck:     Thyroid: No thyromegaly.  Cardiovascular:     Rate and Rhythm: Normal rate and regular rhythm.  Pulmonary:     Effort: No respiratory distress.     Breath sounds: Normal breath sounds. No wheezing.  Abdominal:     General: Bowel sounds are normal.     Palpations: Abdomen is soft.     Tenderness: There is no abdominal tenderness.  Musculoskeletal:        General: No swelling or tenderness.     Cervical back: Neck supple. No tenderness.  Lymphadenopathy:     Cervical: No cervical adenopathy.  Skin:    Findings: No erythema or rash.  Neurological:     Mental Status: She is alert.  Psychiatric:        Mood and Affect: Mood normal.        Behavior: Behavior normal.    BP 118/78 (BP Location: Left Arm, Patient Position: Sitting, Cuff Size: Normal)   Pulse 80   Temp 97.9 F (36.6 C) (Oral)   Ht 5' 4.5" (1.638 m)   Wt 152 lb 9.6 oz (69.2 kg)   LMP 08/09/2012   SpO2 98%   BMI 25.79 kg/m  Wt Readings from Last 3 Encounters:  03/22/21 152 lb 9.6 oz (69.2 kg)  03/18/21 153 lb 4 oz (69.5 kg)  02/28/21 154 lb 12.8 oz (70.2 kg)    Outpatient Encounter Medications as of 03/22/2021  Medication Sig   acetaminophen (TYLENOL) 325 MG tablet Take 650 mg by mouth as needed.    busPIRone (BUSPAR) 5 MG tablet Take 1 tablet (5 mg total) by mouth 2 (two) times daily.   esomeprazole (NEXIUM) 40 MG packet Take 40 mg by mouth daily before breakfast.   hydrALAZINE (APRESOLINE) 25 MG tablet Take 1 tablet (25 mg total) by mouth 2 (two) times daily.   ibuprofen (ADVIL,MOTRIN) 200 MG tablet Take 200 mg by mouth every 6 (six) hours as needed.   imipramine (TOFRANIL) 10 MG tablet Take 1 tablet (10 mg total) by mouth at bedtime.   lovastatin (MEVACOR) 20 MG tablet Take 1 tablet (20 mg total) by mouth at bedtime.   triamcinolone cream (KENALOG) 0.1 % Apply 1 application topically 2 (two) times daily as needed.   trospium (SANCTURA) 20 MG tablet Take 1 tablet (20 mg total) by mouth daily.   [DISCONTINUED] hydrALAZINE (APRESOLINE) 10 MG tablet Take 10 mg by mouth 3 (three) times daily.   [DISCONTINUED] Melatonin 10 MG CAPS Take by mouth as needed.  (Patient not taking: Reported on 03/22/2021)   [DISCONTINUED] mupirocin ointment (BACTROBAN) 2 % Apply to affected area bid (Patient not taking: Reported on 03/22/2021)   No facility-administered encounter medications on file as of 03/22/2021.     Lab Results  Component Value Date   WBC 5.9 01/29/2021   HGB 12.5 01/29/2021   HCT 37.2 01/29/2021   PLT 233.0 01/29/2021   GLUCOSE 112 (H) 01/29/2021   CHOL 182 12/04/2020   TRIG 59.0 12/04/2020   HDL 82.50 12/04/2020   LDLCALC 87 12/04/2020   ALT 9 12/04/2020   AST 15 12/04/2020   NA 137 01/29/2021   K 4.2 01/29/2021   CL 102 01/29/2021   CREATININE 1.02 01/29/2021   BUN  14 01/29/2021   CO2 28 01/29/2021   TSH 3.90 12/04/2020   HGBA1C 5.8 12/04/2020    DG Bone Density  Result Date: 01/04/2020 EXAM: DUAL X-RAY ABSORPTIOMETRY (DXA) FOR BONE MINERAL DENSITY IMPRESSION: Your patient Lanitra Battaglini completed a BMD test on 01/04/2020 using the [employer] (software version: 14.10) manufactured by [employer]. The following summarizes the results of our  evaluation. Technologist: MTB PATIENT BIOGRAPHICAL: Name: Shamya, Macfadden Patient ID: [id number] Birth Date: 06/18/1941 Height:     64.0 in. Gender: Female Exam Date: 01/04/2020 Weight:     149.0 lbs. Indications: Caucasian, Hysterectomy, Oophorectomy Bilateral, Postmenopausal Fractures: Treatments: Multi-Vitamin, Nexium, Vitamin D DENSITOMETRY RESULTS: Site         Region     Measured Date Measured Age WHO Classification Young Adult T-score BMD         %Change vs. Previous Significant Change (*) DualFemur Neck Right 01/04/2020 79.9 Osteoporosis -2.7 0.669 g/cm2 - - DualFemur Total Mean 01/04/2020 79.9 Osteopenia -2.3 0.720 g/cm2 - - Left Forearm Radius 33% 01/04/2020 79.9 Osteoporosis -3.1 0.602 g/cm2 - - ASSESSMENT: The BMD measured at Forearm Radius 33% is 0.602 g/cm2 with a T-score of -3.1. This patient is considered OSTEOPOROTIC according to [facility]) criteria. The scan quality is good. Lumbar spine was not utilized due to advanced degenerative changes. World Pharmacologist [facility]) criteria for post-menopausal, Caucasian Women: Normal:                   T-score at or above -1 SD Osteopenia/low bone mass: T-score between -1 and -2.5 SD Osteoporosis:             T-score at or below -2.5 SD RECOMMENDATIONS: 1. All patients should optimize calcium and vitamin D intake. 2. Consider FDA-approved medical therapies in postmenopausal women and men aged 38 years and older, based on the following: a. A hip or vertebral(clinical or morphometric) fracture b. T-score < -2.5 at the femoral neck or spine after appropriate evaluation to exclude secondary causes c. Low bone mass (T-score between -1.0 and -2.5 at the femoral neck or spine) and a 10-year probability of a hip fracture > 3% or a 10-year probability of a major osteoporosis-related fracture > 20% based on the [place]-adapted WHO algorithm 3. Clinician judgment and/or patient preferences may indicate treatment for people with 10-year fracture  probabilities above or below these levels FOLLOW-UP: People with diagnosed cases of osteoporosis or at high risk for fracture should have regular bone mineral density tests. For patients eligible for Medicare, routine testing is allowed once every 2 years. The testing frequency can be increased to one year for patients who have rapidly progressing disease, those who are receiving or discontinuing medical therapy to restore bone mass, or have additional risk factors. I have reviewed this report, and agree with the above findings. Mark A. Thornton Papas, M.D. [facility] Electronically Signed   By: Lavonia Dana M.D.   On: 01/04/2020 12:22   MM 3D SCREEN BREAST BILATERAL  Result Date: 01/04/2020 CLINICAL DATA:  Screening. EXAM: DIGITAL SCREENING BILATERAL MAMMOGRAM WITH TOMO AND CAD COMPARISON:  Previous exam(s). ACR Breast Density Category b: There are scattered areas of fibroglandular density. FINDINGS: There are no findings suspicious for malignancy. Images were processed with CAD. IMPRESSION: No mammographic evidence of malignancy. A result letter of this screening mammogram will be mailed directly to the patient. RECOMMENDATION: Screening mammogram in one year. (Code:SM-B-01Y) BI-RADS CATEGORY  1: Negative. Electronically Signed  By: Lillia Mountain M.D.   On: 01/04/2020 12:38       Assessment & Plan:   Problem List Items Addressed This Visit     Aortic atherosclerosis ([place])    Continue mevacor.        Relevant Medications   hydrALAZINE (APRESOLINE) 25 MG tablet   Breast cancer screening    Notify me when agreeable to schedule.        Chronic interstitial cystitis    Followed by urology.  Doing well on imipramine and sanctura.  Doing better.        Essential hypertension    Blood pressure remains elevated.  Had intolerance to amlodipine.  On hydralazine.  Tolerating.  Increased to $RemoveBefo'25mg'kfdWMcOuaYC$  bid.  Follow pressures.  Follow metabolic panel.        Relevant Medications   hydrALAZINE  (APRESOLINE) 25 MG tablet   Other Relevant Orders   Basic metabolic panel   GERD (gastroesophageal reflux disease)    No upper symptoms reported.  Continue nexium.        Hypercholesterolemia    Continue mevacor.  Low cholesterol diet and exercise.  Follow lipid panel and liver function tests.        Relevant Medications   hydrALAZINE (APRESOLINE) 25 MG tablet   Other Relevant Orders   Hepatic function panel   Lipid panel   Hyperglycemia    Low carb diet and exercise. Follow met b and a1c.        Relevant Orders   Hemoglobin A1c   Near syncope    Saw cardiology.  zio monitor in place.  No recurring episodes.  Follow.         Relevant Medications   hydrALAZINE (APRESOLINE) 25 MG tablet   Skin lesion    Triamcinolone cream as directed.   Follow.         Stress    Doing better.  Continue buspar.  Follow.          Einar Pheasant, MD

## 2021-03-22 NOTE — Patient Instructions (Signed)
Stop hydralazine 10mg .  Start hydralazine 25mg  twice a day.

## 2021-03-23 ENCOUNTER — Encounter: Payer: Self-pay | Admitting: Internal Medicine

## 2021-03-23 DIAGNOSIS — Z1239 Encounter for other screening for malignant neoplasm of breast: Secondary | ICD-10-CM | POA: Insufficient documentation

## 2021-03-23 DIAGNOSIS — L989 Disorder of the skin and subcutaneous tissue, unspecified: Secondary | ICD-10-CM | POA: Insufficient documentation

## 2021-03-23 NOTE — Assessment & Plan Note (Addendum)
Triamcinolone cream as directed.   Follow.

## 2021-03-23 NOTE — Assessment & Plan Note (Signed)
Followed by urology.  Doing well on imipramine and sanctura.  Doing better.

## 2021-03-23 NOTE — Assessment & Plan Note (Signed)
Saw cardiology.  zio monitor in place.  No recurring episodes.  Follow.

## 2021-03-23 NOTE — Assessment & Plan Note (Signed)
Doing better.  Continue buspar.  Follow.

## 2021-03-23 NOTE — Assessment & Plan Note (Signed)
Continue mevacor °

## 2021-03-23 NOTE — Assessment & Plan Note (Signed)
Continue mevacor.  Low cholesterol diet and exercise.  Follow lipid panel and liver function tests.  

## 2021-03-23 NOTE — Assessment & Plan Note (Signed)
Notify me when agreeable to schedule.

## 2021-03-23 NOTE — Assessment & Plan Note (Signed)
Blood pressure remains elevated.  Had intolerance to amlodipine.  On hydralazine.  Tolerating.  Increased to 25mg  bid.  Follow pressures.  Follow metabolic panel.

## 2021-03-23 NOTE — Assessment & Plan Note (Signed)
No upper symptoms reported.  Continue nexium.  

## 2021-03-23 NOTE — Assessment & Plan Note (Signed)
Low carb diet and exercise. Follow met b and a1c.  

## 2021-04-01 DIAGNOSIS — R002 Palpitations: Secondary | ICD-10-CM | POA: Diagnosis not present

## 2021-04-01 DIAGNOSIS — R55 Syncope and collapse: Secondary | ICD-10-CM

## 2021-04-05 ENCOUNTER — Ambulatory Visit: Payer: Self-pay | Admitting: Physician Assistant

## 2021-04-09 DIAGNOSIS — R55 Syncope and collapse: Secondary | ICD-10-CM | POA: Diagnosis not present

## 2021-04-09 DIAGNOSIS — R002 Palpitations: Secondary | ICD-10-CM | POA: Diagnosis not present

## 2021-04-17 ENCOUNTER — Telehealth: Payer: Self-pay | Admitting: Internal Medicine

## 2021-04-17 ENCOUNTER — Other Ambulatory Visit: Payer: Self-pay | Admitting: Internal Medicine

## 2021-04-17 DIAGNOSIS — Z1231 Encounter for screening mammogram for malignant neoplasm of breast: Secondary | ICD-10-CM

## 2021-04-17 NOTE — Telephone Encounter (Signed)
PT wanted to talk to Larena Glassman in regards to getting a mammogram setup as she had talk to the clinic where was going to get one and they told her she needs a order from her Dr.

## 2021-04-18 ENCOUNTER — Other Ambulatory Visit: Payer: Self-pay

## 2021-04-18 DIAGNOSIS — Z1231 Encounter for screening mammogram for malignant neoplasm of breast: Secondary | ICD-10-CM

## 2021-04-18 NOTE — Telephone Encounter (Signed)
I had called per patient request to schedule patient screening mammo. I was informed that when patient called yesterday to try to schedule that she stated that she was having left breast soreness. They told me that a diagnostic would have to be ordered. I let patient know that I would see what you would like to do or if you wanted to see her first? She said that left breast was sore yesterday & seemed to be fine today. She stated that maybe she just laid on her breast wrong. Please advise?

## 2021-04-19 ENCOUNTER — Other Ambulatory Visit: Payer: Self-pay | Admitting: Internal Medicine

## 2021-04-19 DIAGNOSIS — Z1231 Encounter for screening mammogram for malignant neoplasm of breast: Secondary | ICD-10-CM

## 2021-04-19 NOTE — Telephone Encounter (Signed)
Pt mammogram has been scheduled.

## 2021-04-19 NOTE — Telephone Encounter (Signed)
Need to confirm with Ms Bixler if the pain was just for one day or has this been a persistent issue for her.  Also any nodules, nipple discharge or change, etc?  If discomfort only for one day and no other problems, can go ahead and schedule screening mammogram.  If any problems, let me know and I will work in for evaluation.

## 2021-04-19 NOTE — Telephone Encounter (Signed)
Placed call to pt. Pt states it has not been a persistent issue and was just a one day thing. She has not had any nodules, discharge or change to her breast.

## 2021-04-25 ENCOUNTER — Encounter: Payer: Self-pay | Admitting: Emergency Medicine

## 2021-04-25 ENCOUNTER — Telehealth: Payer: Self-pay | Admitting: Internal Medicine

## 2021-04-25 ENCOUNTER — Ambulatory Visit
Admission: EM | Admit: 2021-04-25 | Discharge: 2021-04-25 | Disposition: A | Discharge status: Home / Self Care | Payer: Medicare PPO | Attending: Emergency Medicine | Admitting: Emergency Medicine

## 2021-04-25 DIAGNOSIS — N390 Urinary tract infection, site not specified: Secondary | ICD-10-CM | POA: Diagnosis not present

## 2021-04-25 LAB — POCT URINALYSIS DIP (MANUAL ENTRY)
Bilirubin, UA: NEGATIVE
Glucose, UA: NEGATIVE mg/dL
Ketones, POC UA: NEGATIVE mg/dL
Nitrite, UA: POSITIVE — AB
Protein Ur, POC: 100 mg/dL — AB
Spec Grav, UA: 1.015 (ref 1.010–1.025)
Urobilinogen, UA: 1 E.U./dL
pH, UA: 7 (ref 5.0–8.0)

## 2021-04-25 MED ORDER — SULFAMETHOXAZOLE-TRIMETHOPRIM 800-160 MG PO TABS
1.0000 | ORAL_TABLET | Freq: Two times a day (BID) | ORAL | 0 refills | Status: AC
Start: 2021-04-25 — End: 2021-04-28

## 2021-04-25 NOTE — Telephone Encounter (Signed)
Patient is currently in [place].

## 2021-04-25 NOTE — ED Provider Notes (Signed)
Subjective:    Rachael Austin is a very pleasant 80 y.o. female who presents with concerns for UTI due to urinary frequency, dysuria and low back discomfort onset yesterday. No unilateral back pain, vomiting, fever, vaginal discharge.  Past medical history, past surgical history, current medications reviewed.  Allergies: is allergic to augmentin [amoxicillin-pot clavulanate], lexapro [escitalopram oxalate], micardis [telmisartan], myrbetriq [mirabegron], niacin and related, codeine sulfate, and vesicare [solifenacin succinate].  Review of Systems See HPI   Objective:     Vitals:   04/25/21 0934  BP: (!) 187/72  Pulse: 92  Resp: 20  Temp: (!) 97.2 F (36.2 C)  SpO2: 98%     General: Appears well-developed and well-nourished. No acute distress.  Cardiovascular: Normal rate Pulm/Chest: No respiratory distress Abdominal: No CVAT.  Neurological: Alert and oriented to person, place, and time.  Skin: Skin is warm and dry.  Psychiatric: Normal mood, affect, behavior, and thought content.  GU:  Deferred secondary to self collect specimen  Laboratory:  Orders Placed This Encounter  Procedures   Urine Culture   POCT urinalysis dipstick   Results for orders placed or performed during the hospital encounter of 04/25/21  POCT urinalysis dipstick  Result Value Ref Range   Color, UA yellow yellow   Clarity, UA cloudy (A) clear   Glucose, UA negative negative mg/dL   Bilirubin, UA negative negative   Ketones, POC UA negative negative mg/dL   Spec Grav, UA 1.015 1.010 - 1.025   Blood, UA large (A) negative   pH, UA 7.0 5.0 - 8.0   Protein Ur, POC =100 (A) negative mg/dL   Urobilinogen, UA 1.0 0.2 or 1.0 E.U./dL   Nitrite, UA Positive (A) Negative   Leukocytes, UA Large (3+) (A) Negative    -Urinalysis reveals cloudy urine with large blood, 100 protein, positive nitrite and large leukocytes. Assessment:   1. Acute UTI  Meds ordered this encounter  Medications    sulfamethoxazole-trimethoprim (BACTRIM DS) 800-160 MG tablet    Sig: Take 1 tablet by mouth 2 (two) times daily for 3 days.    Dispense:  6 tablet    Refill:  0    Order Specific Question:   Supervising Provider    Answer:   Chase Picket [id number]     Plan:   MDM: Patient presents with concerns for UTI due to urinary frequency, dysuria and low back discomfort onset yesterday. No unilateral back pain, vomiting, fever, vaginal discharge.  Chart review completed.  Urinalysis reveals cloudy urine with large blood, 100 protein, positive nitrite and large leukocytes.  Urine culture pending.  Rx Bactrim to the patient's preferred pharmacy and advised about home treatment and care as outlined in her AVS.  Stable on discharge.  Return as needed.  Patient verbalized understanding and agreed with plan.    Discharge Instructions      You were seen today for an infection in the lower urinary tract. Your urine sample today was sent for a culture. The urine culture will show what type of bacteria grows and if you are on the appropriate antibiotic. If the antibiotic needs to be changed, you will receive a phone call from the follow up nurse who will give you more information. If you do not receive a call, then you are on the correct antibiotic.  Take antibiotics as directed. Finish course even if feeling better sooner. Drink plenty of clear fluids. Over the counter "Uristat" or "Azo Standard" may help your discomfort.  This will turn  your urine dark orange or red and may stain contacts (remove before using). You may experience 24 - 48 hours of continuing discomfort until medication controls the infection.  Return to clinic or go to the ER if you develop a fever, one-sided back pain, or vomiting as these are signs of a worsening infection.        [name], FNP-C 04/25/21  A copy of these instructions was provided to the patient or responsible parent/guardian, who expressed understanding and  agreed with the treatment plan.  All questions addressed.  This note was partially made with the aid of speech-to-text dictation; typographical errors are not intentional.    [name], [place] 04/25/21 1028

## 2021-04-25 NOTE — Telephone Encounter (Signed)
Transferred to Access Nurse  Pt stated that she is having trouble passing urine, sever abdominal pain, chills and the pain is making her sick to her stomach   Access Nurse aware no available appts

## 2021-04-25 NOTE — Telephone Encounter (Signed)
Being evaluated in Urgent care.

## 2021-04-25 NOTE — Discharge Instructions (Addendum)
You were seen today for an infection in the lower urinary tract. Your urine sample today was sent for a culture. The urine culture will show what type of bacteria grows and if you are on the appropriate antibiotic. If the antibiotic needs to be changed, you will receive a phone call from the follow up nurse who will give you more information. If you do not receive a call, then you are on the correct antibiotic.  Take antibiotics as directed. Finish course even if feeling better sooner. Drink plenty of clear fluids. Over the counter "Uristat" or "Azo Standard" may help your discomfort.  This will turn your urine dark orange or red and may stain contacts (remove before using). You may experience 24 - 48 hours of continuing discomfort until medication controls the infection.  Return to clinic or go to the ER if you develop a fever, one-sided back pain, or vomiting as these are signs of a worsening infection.  

## 2021-04-25 NOTE — ED Triage Notes (Signed)
Pt here with burning while urinating and frequency since yesterday.

## 2021-04-27 LAB — URINE CULTURE: Culture: >=100000 — AB

## 2021-04-29 ENCOUNTER — Ambulatory Visit
Admission: RE | Admit: 2021-04-29 | Discharge: 2021-04-29 | Disposition: A | Discharge status: Home / Self Care | Payer: Medicare PPO | Source: Ambulatory Visit | Attending: Internal Medicine | Admitting: Internal Medicine

## 2021-04-29 ENCOUNTER — Other Ambulatory Visit: Payer: Self-pay

## 2021-04-29 DIAGNOSIS — Z1231 Encounter for screening mammogram for malignant neoplasm of breast: Secondary | ICD-10-CM | POA: Diagnosis not present

## 2021-05-14 ENCOUNTER — Other Ambulatory Visit: Payer: Self-pay | Admitting: Internal Medicine

## 2021-05-21 ENCOUNTER — Ambulatory Visit: Payer: Medicare PPO | Admitting: Internal Medicine

## 2021-05-21 ENCOUNTER — Other Ambulatory Visit: Payer: Self-pay

## 2021-05-21 ENCOUNTER — Other Ambulatory Visit (HOSPITAL_COMMUNITY)
Admission: RE | Admit: 2021-05-21 | Discharge: 2021-05-21 | Disposition: A | Discharge status: Home / Self Care | Payer: Medicare PPO | Source: Ambulatory Visit | Attending: Internal Medicine | Admitting: Internal Medicine

## 2021-05-21 VITALS — BP 146/80 | HR 84 | Temp 97.9°F | Resp 16 | Ht 65.0 in | Wt 152.2 lb

## 2021-05-21 DIAGNOSIS — F439 Reaction to severe stress, unspecified: Secondary | ICD-10-CM | POA: Diagnosis not present

## 2021-05-21 DIAGNOSIS — R300 Dysuria: Secondary | ICD-10-CM

## 2021-05-21 DIAGNOSIS — R55 Syncope and collapse: Secondary | ICD-10-CM

## 2021-05-21 DIAGNOSIS — E7800 Pure hypercholesterolemia, unspecified: Secondary | ICD-10-CM

## 2021-05-21 DIAGNOSIS — I700 Atherosclerosis of aorta: Secondary | ICD-10-CM | POA: Diagnosis not present

## 2021-05-21 DIAGNOSIS — R739 Hyperglycemia, unspecified: Secondary | ICD-10-CM

## 2021-05-21 DIAGNOSIS — K219 Gastro-esophageal reflux disease without esophagitis: Secondary | ICD-10-CM | POA: Diagnosis not present

## 2021-05-21 DIAGNOSIS — N760 Acute vaginitis: Secondary | ICD-10-CM | POA: Diagnosis not present

## 2021-05-21 DIAGNOSIS — I10 Essential (primary) hypertension: Secondary | ICD-10-CM

## 2021-05-21 LAB — URINALYSIS, ROUTINE W REFLEX MICROSCOPIC
Bilirubin Urine: NEGATIVE
Hgb urine dipstick: NEGATIVE
Ketones, ur: NEGATIVE
Nitrite: NEGATIVE
RBC / HPF: NONE SEEN (ref 0–?)
Specific Gravity, Urine: <=1.005 — AB (ref 1.000–1.030)
Total Protein, Urine: NEGATIVE
Urine Glucose: NEGATIVE
Urobilinogen, UA: 0.2 (ref 0.0–1.0)
pH: 6.5 (ref 5.0–8.0)

## 2021-05-21 LAB — HEPATIC FUNCTION PANEL
ALT: 10 U/L (ref 0–35)
AST: 16 U/L (ref 0–37)
Albumin: 4.5 g/dL (ref 3.5–5.2)
Alkaline Phosphatase: 75 U/L (ref 39–117)
Bilirubin, Direct: 0.1 mg/dL (ref 0.0–0.3)
Total Bilirubin: 0.5 mg/dL (ref 0.2–1.2)
Total Protein: 6.7 g/dL (ref 6.0–8.3)

## 2021-05-21 LAB — BASIC METABOLIC PANEL
BUN: 7 mg/dL (ref 6–23)
CO2: 28 mEq/L (ref 19–32)
Calcium: 9.6 mg/dL (ref 8.4–10.5)
Chloride: 100 mEq/L (ref 96–112)
Creatinine, Ser: 1 mg/dL (ref 0.40–1.20)
GFR: 52.89 mL/min — ABNORMAL LOW (ref >60.00)
Glucose, Bld: 88 mg/dL (ref 70–99)
Potassium: 4.7 mEq/L (ref 3.5–5.1)
Sodium: 137 mEq/L (ref 135–145)

## 2021-05-21 LAB — LIPID PANEL
Cholesterol: 158 mg/dL (ref 0–200)
HDL: 78.1 mg/dL (ref >39.00)
LDL Cholesterol: 67 mg/dL (ref 0–99)
NonHDL: 80.28
Total CHOL/HDL Ratio: 2
Triglycerides: 67 mg/dL (ref 0.0–149.0)
VLDL: 13.4 mg/dL (ref 0.0–40.0)

## 2021-05-21 LAB — HEMOGLOBIN A1C: Hgb A1c MFr Bld: 5.8 % (ref 4.6–6.5)

## 2021-05-21 MED ORDER — NYSTATIN 100000 UNIT/GM EX CREA: 1.0000 "application " | TOPICAL_CREAM | Freq: Two times a day (BID) | CUTANEOUS | 0 refills | Status: DC

## 2021-05-21 NOTE — Progress Notes (Signed)
[name], female   DOB: May 08, 1941, 80 y.o.   MRN: [id number]   Subjective:    [name], female    DOB: [birth year], 80 y.o.   MRN: [id number]  HPI This visit occurred during the SARS-CoV-2 public health emergency.  Safety protocols were in place, including screening questions prior to the visit, additional usage of staff PPE, and extensive cleaning of exam room while observing appropriate contact time as indicated for disinfecting solutions.   Patient here for a scheduled follow up.  Here to follow up regarding her blood pressure.  Also having increased burning with urination.  She was seen at [facility] 04/25/21 - urinary frequency.  Treated with Bactrim x 3 days.  States noticing - burning with urination.  Some vaginal itching and tenderness.  Some nocturia.  No hematuria reported.  No increased abdominal pain.  Trying to stay hydrated.  No vomiting.  Eating.  No chest pain.  Undergoing w/up with cardiology for near syncope.  Wore monitor.  Doing better.  No syncope or near syncopal episodes recently.     Past Medical History:  Diagnosis Date   Basal cell carcinoma 06/21/2020   left post shoulder sup, left mid chest, left lat thigh   BCC (basal cell carcinoma of skin) 03/30/2007   R inf med pretibial - BCC   BCC (basal cell carcinoma of skin) 10/12/2013   L nasal ala - BCC   BCC (basal cell carcinoma of skin) 03/10/2018   L mid dorsum med forearm - superficial BCC    Breast screening, unspecified 2013   Cancer ([place]) 1992   skin   Degenerative disk disease    GERD (gastroesophageal reflux disease)    History of basal cell carcinoma (BCC) 08/29/2020    left inferior knee lateral, , left inferior knee medial, right pretibia   History of SCC (squamous cell carcinoma) of skin 08/29/2020   right posterior calf, left lateral calf, and    Hypercholesterolemia 2008   Interstitial cystitis    followed by Dr Jacqlyn Larsen   Other sign and symptom in breast 2013   Left  upper outer quadrant breast "soreness" Ultrasound exam of right breast in the 2 o'clock position with the breast distracted medially showed a 0.3-0.4 with 0.5 cm simple cyst. In the 1 o'clock position where pt reported tenderness US exam was negatiive.  Because of her history of intermittent nipple drainage, ultrasound was completed of the retroareolar area.   Personal history of tobacco use, presenting hazards to health    PONV (postoperative nausea and vomiting)    SCC (squamous cell carcinoma) 03/28/2008   R dorsum hand - SCC   SCC (squamous cell carcinoma) 05/09/2009   R lat lower leg - SCCIS   SCC (squamous cell carcinoma) 05/09/2009   L lat lower leg - SCCIS   SCC (squamous cell carcinoma) 04/07/2013   L lower leg - SCCIS   SCC (squamous cell carcinoma) 04/27/2013   R forearm - SCC   SCC (squamous cell carcinoma) 04/28/2017   L lat knee - SCCIS   SCC (squamous cell carcinoma) 05/12/2018   L prox dorsum forearm - SCC   SCC (squamous cell carcinoma), leg, right 03/30/2007   R sup pretibial - SCCIS   SCC (squamous cell carcinoma);BCC 10/12/2013   Mid back - superficial BCC with SCCIS    Special screening for malignant neoplasms, colon    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   left dorsal forearm, left  lat calf   Past Surgical History:  Procedure Laterality Date   ABDOMINAL HYSTERECTOMY  1992   APPENDECTOMY     CERVICAL DISCECTOMY     S/P C7-T1 discectomy with fusion   CHOLECYSTECTOMY  1990   COLONOSCOPY WITH PROPOFOL N/A 02/14/2016   Procedure: COLONOSCOPY WITH PROPOFOL;  Surgeon: Lucilla Lame, MD;  Location: [place];  Service: Endoscopy;  Laterality: N/A;  PT WOULD LIKE 10 ARRIVAL TIME OR LATER   DILATION AND CURETTAGE OF UTERUS     ESOPHAGOGASTRODUODENOSCOPY (EGD) WITH PROPOFOL N/A 02/14/2016   Procedure: ESOPHAGOGASTRODUODENOSCOPY (EGD) WITH PROPOFOL with dialtion;  Surgeon: Lucilla Lame, MD;  Location: [place];  Service: Endoscopy;  Laterality:  N/A;   ESOPHAGOGASTRODUODENOSCOPY (EGD) WITH PROPOFOL N/A 04/13/2019   Procedure: ESOPHAGOGASTRODUODENOSCOPY (EGD) WITH PROPOFOL;  Surgeon: Toledo, Benay Pike, MD;  Location: [facility];  Service: Gastroenterology;  Laterality: N/A;   MELANOMA EXCISION     removed from Left calf 1994   Family History  Problem Relation Age of Onset   Hodgkin's lymphoma Mother    Heart failure Father    Heart attack Father    Arthritis Sister        Three sisters w/ degeneratve disk disease   Headache Sister        Two sisters hx of headache   Breast cancer Neg Hx    Colon cancer Neg Hx    Bladder Cancer Neg Hx    Kidney cancer Neg Hx    Social History   Socioeconomic History   Marital status: Widowed    Spouse name: Not on file   Number of children: 2   Years of education: Not on file   Highest education level: Not on file  Occupational History   Not on file  Tobacco Use   Smoking status: Former    Packs/day: 1.00    Years: 15.00    Pack years: 15.00    Types: Cigarettes   Smokeless tobacco: Never  Vaping Use   Vaping Use: Never used  Substance and Sexual Activity   Alcohol use: No    Alcohol/week: 0.0 standard drinks   Drug use: No   Sexual activity: Not on file  Other Topics Concern   Not on file  Social History Narrative   Married and has 2 children, daughters.   Social Determinants of Health   Financial Resource Strain: Not on file  Food Insecurity: Not on file  Transportation Needs: Not on file  Physical Activity: Not on file  Stress: Not on file  Social Connections: Not on file    Review of Systems  Constitutional:  Negative for appetite change and unexpected weight change.  HENT:  Negative for congestion and sinus pressure.   Respiratory:  Negative for cough, chest tightness and shortness of breath.   Cardiovascular:  Negative for chest pain, palpitations and leg swelling.  Gastrointestinal:  Negative for abdominal pain, diarrhea, nausea and vomiting.   Genitourinary:  Positive for dysuria. Negative for difficulty urinating.  Musculoskeletal:  Negative for joint swelling and myalgias.  Skin:  Negative for color change and rash.  Neurological:  Negative for dizziness, light-headedness and headaches.  Psychiatric/Behavioral:  Negative for agitation and dysphoric mood.       Objective:    Physical Exam Vitals reviewed.  Constitutional:      General: She is not in acute distress.    Appearance: Normal appearance.  HENT:     Head: Normocephalic and atraumatic.     Right Ear:  External ear normal.     Left Ear: External ear normal.  Eyes:     General: No scleral icterus.       Right eye: No discharge.        Left eye: No discharge.     Conjunctiva/sclera: Conjunctivae normal.  Neck:     Thyroid: No thyromegaly.  Cardiovascular:     Rate and Rhythm: Normal rate and regular rhythm.  Pulmonary:     Effort: No respiratory distress.     Breath sounds: Normal breath sounds. No wheezing.  Abdominal:     General: Bowel sounds are normal.     Palpations: Abdomen is soft.     Tenderness: There is no abdominal tenderness.  Genitourinary:    Comments: Normal external genitalia - some mild erythema. Vaginal vault without lesions.  Could not appreciate any adnexal masses or tenderness.  Wet prep sent.  Musculoskeletal:        General: No swelling or tenderness.     Cervical back: Neck supple. No tenderness.  Lymphadenopathy:     Cervical: No cervical adenopathy.  Skin:    Findings: No erythema or rash.  Neurological:     Mental Status: She is alert.  Psychiatric:        Mood and Affect: Mood normal.        Behavior: Behavior normal.    BP (!) 146/80   Pulse 84   Temp 97.9 F (36.6 C)   Resp 16   Ht $R'5\' 5"'ar$  (1.651 m)   Wt 152 lb 3.2 oz (69 kg)   LMP 08/09/2012   SpO2 98%   BMI 25.33 kg/m  Wt Readings from Last 3 Encounters:  05/21/21 152 lb 3.2 oz (69 kg)  03/22/21 152 lb 9.6 oz (69.2 kg)  03/18/21 153 lb 4 oz (69.5 kg)     Outpatient Encounter Medications as of 05/21/2021  Medication Sig   nystatin cream (MYCOSTATIN) Apply 1 application topically 2 (two) times daily.   acetaminophen (TYLENOL) 325 MG tablet Take 650 mg by mouth as needed.   busPIRone (BUSPAR) 5 MG tablet Take 1 tablet by mouth twice daily   esomeprazole (NEXIUM) 40 MG packet Take 40 mg by mouth daily before breakfast.   hydrALAZINE (APRESOLINE) 25 MG tablet Take 1 tablet (25 mg total) by mouth 2 (two) times daily.   ibuprofen (ADVIL,MOTRIN) 200 MG tablet Take 200 mg by mouth every 6 (six) hours as needed.   imipramine (TOFRANIL) 10 MG tablet Take 1 tablet (10 mg total) by mouth at bedtime.   lovastatin (MEVACOR) 20 MG tablet Take 1 tablet (20 mg total) by mouth at bedtime.   triamcinolone cream (KENALOG) 0.1 % Apply 1 application topically 2 (two) times daily as needed.   trospium (SANCTURA) 20 MG tablet Take 1 tablet (20 mg total) by mouth daily.   No facility-administered encounter medications on file as of 05/21/2021.     Lab Results  Component Value Date   WBC 5.9 01/29/2021   HGB 12.5 01/29/2021   HCT 37.2 01/29/2021   PLT 233.0 01/29/2021   GLUCOSE 88 05/21/2021   CHOL 158 05/21/2021   TRIG 67.0 05/21/2021   HDL 78.10 05/21/2021   LDLCALC 67 05/21/2021   ALT 10 05/21/2021   AST 16 05/21/2021   NA 137 05/21/2021   K 4.7 05/21/2021   CL 100 05/21/2021   CREATININE 1.00 05/21/2021   BUN 7 05/21/2021   CO2 28 05/21/2021   TSH 3.90 12/04/2020   HGBA1C  5.8 05/21/2021    MM 3D SCREEN BREAST BILATERAL  Result Date: 05/01/2021 CLINICAL DATA:  Screening. EXAM: DIGITAL SCREENING BILATERAL MAMMOGRAM WITH TOMOSYNTHESIS AND CAD TECHNIQUE: Bilateral screening digital craniocaudal and mediolateral oblique mammograms were obtained. Bilateral screening digital breast tomosynthesis was performed. The images were evaluated with computer-aided detection. COMPARISON:  Previous exam(s). ACR Breast Density Category b: There are scattered areas  of fibroglandular density. FINDINGS: There are no findings suspicious for malignancy. IMPRESSION: No mammographic evidence of malignancy. A result letter of this screening mammogram will be mailed directly to the patient. RECOMMENDATION: Screening mammogram in one year. (Code:SM-B-01Y) BI-RADS CATEGORY  1: Negative. Electronically Signed   By: Lillia Mountain M.D.   On: 05/01/2021 14:01      Assessment & Plan:   Problem List Items Addressed This Visit     Aortic atherosclerosis ([place])    Continue mevacor.       Dysuria    Recently treated for UTI with bactrim.  With some vaginal irritation and dysuria.  Check urine to confirm infection cleared.  Nystatin cream as directed.  Follow.  Call with update.  Wet obtained.       Relevant Orders   Urinalysis, Routine w reflex microscopic (Completed)   Urine Culture (Completed)   Essential hypertension - Primary    Intolerant to amlodipine.  Blood pressure elevated.  Take hydralazine as ordered.  Follow pressures.  Follow metabolic panel.       GERD (gastroesophageal reflux disease)    No upper symptoms reported.  Continue nexium.       Hypercholesterolemia    Continue mevacor.  Low cholesterol diet and exercise.  Follow lipid panel and liver function tests.       Hyperglycemia    Low carb diet and exercise. Follow met b and a1c.       Near syncope    Seeing cardiology.  Wore zio monitor.  Following with cardiology.  No recent episodes.  Follow.        Stress    Taking buspar. Overall appears to be doing better.  Feels better.  Follow.       Other Visit Diagnoses     Acute vaginitis       Relevant Orders   Cervicovaginal ancillary only( [place]) (Completed)        Einar Pheasant, MD

## 2021-05-22 LAB — URINE CULTURE
MICRO NUMBER:: 12279614
SPECIMEN QUALITY:: ADEQUATE

## 2021-05-23 ENCOUNTER — Telehealth: Payer: Self-pay

## 2021-05-23 LAB — CERVICOVAGINAL ANCILLARY ONLY
Bacterial Vaginitis (gardnerella): NEGATIVE
Candida Glabrata: NEGATIVE
Candida Vaginitis: NEGATIVE
Comment: NEGATIVE
Comment: NEGATIVE
Comment: NEGATIVE

## 2021-05-23 NOTE — Telephone Encounter (Signed)
Was able to reach out to pt via phone and make contact to review their recent ZIO monitor results. Dr. Rockey Situ advised based on the current results Event monitor  Normal sinus rhythm,  Rare episode of tachycardia, no more than 2 a day  Lasting from several seconds, longest was 20 seconds  Unclear if this is a contributor to prior episodes over the summer with near syncope.  Suspect those episodes were most likely from the heat  If there is concern of additional episodes from these short arrhythmias, could consider adding beta-blocker such as metoprolol succinate 25 mg daily in an effort to suppress rhythms   Pt verbalized understanding, is thankful for the results call, at this time will hold off on the metoprolol, if she feels there are more arrhythmias, then she will call back to discuss adding metoprolol. Otherwise all questions and concerns were address. Will call back for anything further, f/u as schedule.

## 2021-05-26 ENCOUNTER — Encounter: Payer: Self-pay | Admitting: Internal Medicine

## 2021-05-26 DIAGNOSIS — R300 Dysuria: Secondary | ICD-10-CM | POA: Insufficient documentation

## 2021-05-26 NOTE — Assessment & Plan Note (Signed)
Continue mevacor °

## 2021-05-26 NOTE — Assessment & Plan Note (Signed)
No upper symptoms reported.  Continue nexium.  

## 2021-05-26 NOTE — Assessment & Plan Note (Signed)
Recently treated for UTI with bactrim.  With some vaginal irritation and dysuria.  Check urine to confirm infection cleared.  Nystatin cream as directed.  Follow.  Call with update.  Wet obtained.

## 2021-05-26 NOTE — Assessment & Plan Note (Signed)
Low carb diet and exercise. Follow met b and a1c.  

## 2021-05-26 NOTE — Assessment & Plan Note (Signed)
Intolerant to amlodipine.  Blood pressure elevated.  Take hydralazine as ordered.  Follow pressures.  Follow metabolic panel.

## 2021-05-26 NOTE — Assessment & Plan Note (Signed)
Taking buspar. Overall appears to be doing better.  Feels better.  Follow.

## 2021-05-26 NOTE — Assessment & Plan Note (Signed)
Continue mevacor.  Low cholesterol diet and exercise.  Follow lipid panel and liver function tests.  

## 2021-05-26 NOTE — Assessment & Plan Note (Signed)
Seeing cardiology.  Wore zio monitor.  Following with cardiology.  No recent episodes.  Follow.

## 2021-06-08 ENCOUNTER — Other Ambulatory Visit: Payer: Self-pay | Admitting: Internal Medicine

## 2021-06-11 ENCOUNTER — Other Ambulatory Visit: Payer: Self-pay | Admitting: Internal Medicine

## 2021-06-13 ENCOUNTER — Telehealth: Payer: Self-pay | Admitting: Internal Medicine

## 2021-06-13 ENCOUNTER — Ambulatory Visit: Payer: Medicare PPO | Admitting: Dermatology

## 2021-06-13 ENCOUNTER — Other Ambulatory Visit: Payer: Self-pay

## 2021-06-13 DIAGNOSIS — D485 Neoplasm of uncertain behavior of skin: Secondary | ICD-10-CM | POA: Diagnosis not present

## 2021-06-13 DIAGNOSIS — C4441 Basal cell carcinoma of skin of scalp and neck: Secondary | ICD-10-CM

## 2021-06-13 DIAGNOSIS — C44629 Squamous cell carcinoma of skin of left upper limb, including shoulder: Secondary | ICD-10-CM | POA: Diagnosis not present

## 2021-06-13 DIAGNOSIS — L578 Other skin changes due to chronic exposure to nonionizing radiation: Secondary | ICD-10-CM | POA: Diagnosis not present

## 2021-06-13 DIAGNOSIS — C44719 Basal cell carcinoma of skin of left lower limb, including hip: Secondary | ICD-10-CM

## 2021-06-13 DIAGNOSIS — C4491 Basal cell carcinoma of skin, unspecified: Secondary | ICD-10-CM

## 2021-06-13 HISTORY — DX: Basal cell carcinoma of skin, unspecified: C44.91

## 2021-06-13 MED ORDER — MUPIROCIN 2 % EX OINT: 1.0000 "application " | TOPICAL_OINTMENT | Freq: Every day | CUTANEOUS | 0 refills | Status: DC

## 2021-06-13 MED ORDER — ESOMEPRAZOLE MAGNESIUM 40 MG PO PACK
40.0000 mg | PACK | Freq: Every day | ORAL | 1 refills | Status: DC
Start: 2021-06-13 — End: 2021-06-17

## 2021-06-13 NOTE — Patient Instructions (Addendum)
Wound Care Instructions  Cleanse wound gently with soap and water once a day then pat dry with clean gauze. Apply a thing coat of Petrolatum (petroleum jelly, "Vaseline") over the wound (unless you have an allergy to this). We recommend that you use a new, sterile tube of Vaseline. Do not pick or remove scabs. Do not remove the yellow or white "healing tissue" from the base of the wound.  Cover the wound with fresh, clean, nonstick gauze and secure with paper tape. You may use Band-Aids in place of gauze and tape if the would is small enough, but would recommend trimming much of the tape off as there is often too much. Sometimes Band-Aids can irritate the skin.  You should call the office for your biopsy report after 1 week if you have not already been contacted.  If you experience any problems, such as abnormal amounts of bleeding, swelling, significant bruising, significant pain, or evidence of infection, please call the office immediately.  FOR ADULT SURGERY PATIENTS: If you need something for pain relief you may take 1 extra strength Tylenol (acetaminophen) AND 2 Ibuprofen (200mg each) together every 4 hours as needed for pain. (do not take these if you are allergic to them or if you have a reason you should not take them.) Typically, you may only need pain medication for 1 to 3 days.    Recommend daily broad spectrum sunscreen SPF 30+ to sun-exposed areas, reapply every 2 hours as needed. Call for new or changing lesions.  Staying in the shade or wearing long sleeves, sun glasses (UVA+UVB protection) and wide brim hats (4-inch brim around the entire circumference of the hat) are also recommended for sun protection.   If you have any questions or concerns for your doctor, please call our main line at [phone] and press option 4 to reach your doctor's medical assistant. If no one answers, please leave a voicemail as directed and we will return your call as soon as possible. Messages left after  4 pm will be answered the following business day.   You may also send us a message via MyChart. We typically respond to MyChart messages within 1-2 business days.  For prescription refills, please ask your pharmacy to contact our office. Our fax number is [phone].  If you have an urgent issue when the clinic is closed that cannot wait until the next business day, you can page your doctor at the number below.    Please note that while we do our best to be available for urgent issues outside of office hours, we are not available 24/7.   If you have an urgent issue and are unable to reach us, you may choose to seek medical care at your doctor's office, retail clinic, urgent care center, or emergency room.  If you have a medical emergency, please immediately call 911 or go to the emergency department.  Pager Numbers  - Dr. Kowalski: [phone]  - Dr. Moye: [phone]  - Dr. Stewart: [phone]  In the event of inclement weather, please call our main line at [phone] for an update on the status of any delays or closures.  Dermatology Medication Tips: Please keep the boxes that topical medications come in in order to help keep track of the instructions about where and how to use these. Pharmacies typically print the medication instructions only on the boxes and not directly on the medication tubes.   If your medication is too expensive, please contact our office at [phone] option   4 or send us a message through MyChart.   We are unable to tell what your co-pay for medications will be in advance as this is different depending on your insurance coverage. However, we may be able to find a substitute medication at lower cost or fill out paperwork to get insurance to cover a needed medication.   If a prior authorization is required to get your medication covered by your insurance company, please allow us 1-2 business days to complete this process.  Drug prices often vary  depending on where the prescription is filled and some pharmacies may offer cheaper prices.  The website www.goodrx.com contains coupons for medications through different pharmacies. The prices here do not account for what the cost may be with help from insurance (it may be cheaper with your insurance), but the website can give you the price if you did not use any insurance.  - You can print the associated coupon and take it with your prescription to the pharmacy.  - You may also stop by our office during regular business hours and pick up a GoodRx coupon card.  - If you need your prescription sent electronically to a different pharmacy, notify our office through [facility] MyChart or by phone at [phone] option 4.  

## 2021-06-13 NOTE — Progress Notes (Addendum)
Follow-Up Visit   Subjective  Rachael Austin is a 80 y.o. female who presents for the following: Follow-up (Patient here today to have back checked and do biopsies. Patient is concerned about a spot at left foot that won't heal, has been present for a while and is sore. She is using mupirocin on it. Patient also has a spot at left forearm that came up this summer, it is tender around it and feels like a knot under the skin. ).  Patient not able to take Niacinamide due to rash.  The following portions of the chart were reviewed this encounter and updated as appropriate:   Tobacco  Allergies  Meds  Problems  Med Hx  Surg Hx  Fam Hx      Review of Systems:  No other skin or systemic complaints except as noted in HPI or Assessment and Plan.  Objective  Well appearing patient in no apparent distress; mood and affect are within normal limits.  A focused examination was performed including back, arms, left foot, neck. Relevant physical exam findings are noted in the Assessment and Plan.  Left dorsal foot 1.4cm thin pink plaque with central erosion BCC vs SCC is vs other     Left Forearm 1.0cm pink nodule with central crust R/o SCC     right postauricular 0.8cm pink papule with pigment globules and telangiectasias R/o BCC     Right Mid Back 1.2cm thin pink plaque R/o superficial BCC     Left Upper Back 0.6cm thin pink papule  R/o superficial BCC     Right Upper Back 0.7cm thin pink papule R/o BCC       Assessment & Plan  Neoplasm of uncertain behavior of skin (6) Left dorsal foot  Skin / nail biopsy Type of biopsy: tangential   Informed consent: discussed and consent obtained   Timeout: patient name, date of birth, surgical site, and procedure verified   Patient was prepped and draped in usual sterile fashion: Area prepped with isopropyl alcohol. Anesthesia: the lesion was anesthetized in a standard fashion   Anesthetic:  1% lidocaine w/  epinephrine 1-100,000 buffered w/ 8.4% NaHCO3 Instrument used: flexible razor blade   Hemostasis achieved with: aluminum chloride   Outcome: patient tolerated procedure well   Post-procedure details: wound care instructions given   Additional details:  Mupirocin and a bandage applied  mupirocin ointment (BACTROBAN) 2 % Apply 1 application topically daily. With dressing changes  Specimen 1 - Surgical pathology Differential Diagnosis: BCC vs SCC is vs other  Check Margins: No 1.4cm thin pink plaque with central erosion   Left Forearm  Epidermal / dermal shaving  Lesion diameter (cm):  1 Informed consent: discussed and consent obtained   Timeout: patient name, date of birth, surgical site, and procedure verified   Patient was prepped and draped in usual sterile fashion: area prepped with isopropyl alcohol. Anesthesia: the lesion was anesthetized in a standard fashion   Anesthetic:  1% lidocaine w/ epinephrine 1-100,000 buffered w/ 8.4% NaHCO3 Instrument used: flexible razor blade   Hemostasis achieved with: aluminum chloride   Outcome: patient tolerated procedure well   Post-procedure details: wound care instructions given   Additional details:  Mupirocin and a bandage applied  Destruction of lesion  Destruction method: electrodesiccation and curettage   Informed consent: discussed and consent obtained   Timeout:  patient name, date of birth, surgical site, and procedure verified Patient was prepped and draped in usual sterile fashion: area prepped with isopropyl  alcohol. Anesthesia: the lesion was anesthetized in a standard fashion   Anesthetic:  1% lidocaine w/ epinephrine 1-100,000 buffered w/ 8.4% NaHCO3 Curettage performed in three different directions: Yes   Electrodesiccation performed over the curetted area: Yes   Curettage cycles:  3 Final wound size (cm):  1.3 Hemostasis achieved with:  electrodesiccation Outcome: patient tolerated procedure well with no  complications   Post-procedure details: wound care instructions given   Additional details:  Mupirocin and a pressure dressing applied  Specimen 2 - Surgical pathology Differential Diagnosis: R/o SCC  Check Margins: No 1.0cm pink nodule with central crust   right postauricular  Epidermal / dermal shaving  Lesion diameter (cm):  0.8 Informed consent: discussed and consent obtained   Timeout: patient name, date of birth, surgical site, and procedure verified   Patient was prepped and draped in usual sterile fashion: area prepped with isopropyl alcohol. Anesthesia: the lesion was anesthetized in a standard fashion   Anesthetic:  1% lidocaine w/ epinephrine 1-100,000 buffered w/ 8.4% NaHCO3 Instrument used: flexible razor blade   Hemostasis achieved with: aluminum chloride   Outcome: patient tolerated procedure well   Post-procedure details: wound care instructions given   Additional details:  Mupirocin and a bandage applied  Destruction of lesion  Destruction method: electrodesiccation and curettage   Informed consent: discussed and consent obtained   Timeout:  patient name, date of birth, surgical site, and procedure verified Patient was prepped and draped in usual sterile fashion: area prepped with isopropyl alcohol. Anesthesia: the lesion was anesthetized in a standard fashion   Anesthetic:  1% lidocaine w/ epinephrine 1-100,000 buffered w/ 8.4% NaHCO3 Curettage performed in three different directions: Yes   Electrodesiccation performed over the curetted area: Yes   Curettage cycles:  3 Final wound size (cm):  0.8 Hemostasis achieved with:  electrodesiccation Outcome: patient tolerated procedure well with no complications   Post-procedure details: wound care instructions given   Additional details:  Mupirocin and a pressure dressing applied  Specimen 3 - Surgical pathology Differential Diagnosis: R/o BCC  Check Margins: No 0.8cm pink papule with pigment globules and  telangiectasias   Right Mid Back  Left Upper Back  Right Upper Back  Patient defers biopsy and treatment of sites at her back today. Will return in a couple of months for additional biopsies and treatment. Advised they are likely [facility] which is a slow growing type of skin cancer, but I cannot be sure what they are without doing the biopsies.    Actinic Damage - chronic, secondary to cumulative UV radiation exposure/sun exposure over time - diffuse scaly erythematous macules with underlying dyspigmentation - Recommend daily broad spectrum sunscreen SPF 30+ to sun-exposed areas, reapply every 2 hours as needed.  - Recommend staying in the shade or wearing long sleeves, sun glasses (UVA+UVB protection) and wide brim hats (4-inch brim around the entire circumference of the hat). - Call for new or changing lesions.  Return in about 2 months (around 08/13/2021) for TBSE, return in 1-2 months for biopsies and EDC at back x 3.  Graciella Belton, RMA, am acting as scribe for Forest Gleason, MD .  Documentation: I have reviewed the above documentation for accuracy and completeness, and I agree with the above.  Forest Gleason, MD

## 2021-06-13 NOTE — Telephone Encounter (Signed)
Patient called and said she is still waiting for Dr Nicki Reaper to call in her esomeprazole (Powers) 40 MG packet. Please fill.

## 2021-06-14 ENCOUNTER — Telehealth: Payer: Self-pay | Admitting: Internal Medicine

## 2021-06-14 NOTE — Telephone Encounter (Signed)
Patient waiting on her refill of esomeprazole (NEXIUM) 40 MG packet. She put in the refill order a week ago. Patient is upset.

## 2021-06-17 ENCOUNTER — Telehealth: Payer: Self-pay | Admitting: Internal Medicine

## 2021-06-17 MED ORDER — ESOMEPRAZOLE MAGNESIUM 40 MG PO CPDR: 40.0000 mg | DELAYED_RELEASE_CAPSULE | Freq: Every day | ORAL | 1 refills | Status: DC

## 2021-06-17 NOTE — Telephone Encounter (Signed)
Corrected prescription and sent in capsule form. Patient is aware

## 2021-06-17 NOTE — Telephone Encounter (Signed)
Patient called in stating that she went to pick up her acid reflux medicine and it is in powder form instead of pill form like she usually has.She would like to know if she can get pill form instead,please advise.

## 2021-06-18 ENCOUNTER — Telehealth: Payer: Self-pay

## 2021-06-18 NOTE — Telephone Encounter (Signed)
-----   Message from Alfonso Patten, MD sent at 06/18/2021 10:10 AM EDT ----- 1. Skin , left dorsal foot BASAL CELL CARCINOMA, NODULAR PATTERN, BASE INVOLVED --> ED&C at follow-up  2. Skin , left forearm WELL DIFFERENTIATED SQUAMOUS CELL CARCINOMA, BASE INVOLVED --> already treated with ED&C  3. Skin , right postauricular BASAL CELL CARCINOMA, NODULAR PATTERN, BASE INVOLVED --> already treated with [facility]  MAs please call. Thank you!

## 2021-06-18 NOTE — Telephone Encounter (Signed)
Patient advised of BX results and will treated the foot at follow up in November.

## 2021-06-19 ENCOUNTER — Other Ambulatory Visit: Payer: Self-pay | Admitting: Internal Medicine

## 2021-06-19 ENCOUNTER — Other Ambulatory Visit: Payer: Self-pay

## 2021-06-19 MED ORDER — LOVASTATIN 20 MG PO TABS: 20.0000 mg | ORAL_TABLET | Freq: Every day | ORAL | 1 refills | Status: DC

## 2021-06-19 MED ORDER — HYDRALAZINE HCL 25 MG PO TABS: 25.0000 mg | ORAL_TABLET | Freq: Two times a day (BID) | ORAL | 2 refills | Status: DC

## 2021-06-24 ENCOUNTER — Encounter: Payer: Self-pay | Admitting: Dermatology

## 2021-06-27 NOTE — Telephone Encounter (Signed)
I can't close medication refill request. Please close.

## 2021-08-12 ENCOUNTER — Other Ambulatory Visit: Payer: Self-pay

## 2021-08-12 ENCOUNTER — Ambulatory Visit: Payer: Medicare PPO | Admitting: Internal Medicine

## 2021-08-12 ENCOUNTER — Encounter: Payer: Self-pay | Admitting: Internal Medicine

## 2021-08-12 VITALS — BP 130/80 | HR 88 | Temp 97.7°F | Ht 65.0 in | Wt 150.6 lb

## 2021-08-12 DIAGNOSIS — I700 Atherosclerosis of aorta: Secondary | ICD-10-CM

## 2021-08-12 DIAGNOSIS — R300 Dysuria: Secondary | ICD-10-CM | POA: Diagnosis not present

## 2021-08-12 DIAGNOSIS — E7800 Pure hypercholesterolemia, unspecified: Secondary | ICD-10-CM | POA: Diagnosis not present

## 2021-08-12 DIAGNOSIS — K5900 Constipation, unspecified: Secondary | ICD-10-CM | POA: Diagnosis not present

## 2021-08-12 DIAGNOSIS — R739 Hyperglycemia, unspecified: Secondary | ICD-10-CM

## 2021-08-12 DIAGNOSIS — I10 Essential (primary) hypertension: Secondary | ICD-10-CM | POA: Diagnosis not present

## 2021-08-12 DIAGNOSIS — N898 Other specified noninflammatory disorders of vagina: Secondary | ICD-10-CM | POA: Diagnosis not present

## 2021-08-12 DIAGNOSIS — K219 Gastro-esophageal reflux disease without esophagitis: Secondary | ICD-10-CM | POA: Diagnosis not present

## 2021-08-12 DIAGNOSIS — F439 Reaction to severe stress, unspecified: Secondary | ICD-10-CM | POA: Diagnosis not present

## 2021-08-12 MED ORDER — BUSPIRONE HCL 5 MG PO TABS: 5.0000 mg | ORAL_TABLET | Freq: Two times a day (BID) | ORAL | 1 refills | Status: DC

## 2021-08-12 MED ORDER — BETAMETHASONE DIPROPIONATE 0.05 % EX CREA: TOPICAL_CREAM | Freq: Two times a day (BID) | CUTANEOUS | 0 refills | Status: DC

## 2021-08-12 NOTE — Progress Notes (Signed)
[name], female   DOB: 08/17/1941, 80 y.o.   MRN: [id number]   Subjective:    [name], female    DOB: 12/26/1940, 80 y.o.   MRN: [id number]  This visit occurred during the SARS-CoV-2 public health emergency.  Safety protocols were in place, including screening questions prior to the visit, additional usage of staff PPE, and extensive cleaning of exam room while observing appropriate contact time as indicated for disinfecting solutions.   Patient here for a scheduled follow up.   Chief Complaint  Patient presents with   Follow-up    2 mo/ irritation in vaginal area/ urine concerns   .   HPI Here to follow up regarding her blood pressure and cholesterol.  Also, continuing to have vaginal irritation.  Nystatin did not help.  No discharge.  No chest pain or sob reported.  No abdominal pain.  Bowels moving.  States when she wipes - sore.  Has noticed urine is cloudy.  Takes stool softener prn.  Also takes citrucil - helps bowels.  Increased stress.  Discussed.  Taking buspar.  Will notify me if feels needs any further intervention.    Past Medical History:  Diagnosis Date   Basal cell carcinoma 06/21/2020   left post shoulder sup, left mid chest, left lat thigh   Basal cell carcinoma (BCC) 06/13/2021   left dorsal foot   Basal cell carcinoma (BCC) 06/13/2021   right postauricular tx'd w/ EDC   BCC (basal cell carcinoma of skin) 03/30/2007   R inf med pretibial - BCC   BCC (basal cell carcinoma of skin) 10/12/2013   L nasal ala - BCC   BCC (basal cell carcinoma of skin) 03/10/2018   L mid dorsum med forearm - superficial BCC    Breast screening, unspecified 2013   Cancer ([place]) 1992   skin   Degenerative disk disease    GERD (gastroesophageal reflux disease)    History of basal cell carcinoma (BCC) 08/29/2020    left inferior knee lateral, , left inferior knee medial, right pretibia   History of SCC (squamous cell carcinoma) of skin  08/29/2020   right posterior calf, left lateral calf, and    Hypercholesterolemia 2008   Interstitial cystitis    followed by Dr Jacqlyn Larsen   Other sign and symptom in breast 2013   Left upper outer quadrant breast "soreness" Ultrasound exam of right breast in the 2 o'clock position with the breast distracted medially showed a 0.3-0.4 with 0.5 cm simple cyst. In the 1 o'clock position where pt reported tenderness US exam was negatiive.  Because of her history of intermittent nipple drainage, ultrasound was completed of the retroareolar area.   Personal history of tobacco use, presenting hazards to health    PONV (postoperative nausea and vomiting)    SCC (squamous cell carcinoma) 03/28/2008   R dorsum hand - SCC   SCC (squamous cell carcinoma) 05/09/2009   R lat lower leg - SCCIS   SCC (squamous cell carcinoma) 05/09/2009   L lat lower leg - SCCIS   SCC (squamous cell carcinoma) 04/07/2013   L lower leg - SCCIS   SCC (squamous cell carcinoma) 04/27/2013   R forearm - SCC   SCC (squamous cell carcinoma) 04/28/2017   L lat knee - SCCIS   SCC (squamous cell carcinoma) 05/12/2018   L prox dorsum forearm - SCC   SCC (squamous cell carcinoma) 06/13/2021   left forearm tx'd w/ EDC  SCC (squamous cell carcinoma), leg, right 03/30/2007   R sup pretibial - SCCIS   SCC (squamous cell carcinoma);BCC 10/12/2013   Mid back - superficial BCC with SCCIS    Special screening for malignant neoplasms, colon    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   left dorsal forearm, left lat calf   Past Surgical History:  Procedure Laterality Date   ABDOMINAL HYSTERECTOMY  1992   APPENDECTOMY     CERVICAL DISCECTOMY     S/P C7-T1 discectomy with fusion   CHOLECYSTECTOMY  1990   COLONOSCOPY WITH PROPOFOL N/A 02/14/2016   Procedure: COLONOSCOPY WITH PROPOFOL;  Surgeon: Lucilla Lame, MD;  Location: [place];  Service: Endoscopy;  Laterality: N/A;  PT WOULD LIKE 10 ARRIVAL TIME OR LATER   DILATION AND  CURETTAGE OF UTERUS     ESOPHAGOGASTRODUODENOSCOPY (EGD) WITH PROPOFOL N/A 02/14/2016   Procedure: ESOPHAGOGASTRODUODENOSCOPY (EGD) WITH PROPOFOL with dialtion;  Surgeon: Lucilla Lame, MD;  Location: [place];  Service: Endoscopy;  Laterality: N/A;   ESOPHAGOGASTRODUODENOSCOPY (EGD) WITH PROPOFOL N/A 04/13/2019   Procedure: ESOPHAGOGASTRODUODENOSCOPY (EGD) WITH PROPOFOL;  Surgeon: Tol[place], Benay Pike, MD;  Location: [facility];  Service: Gastroenterology;  Laterality: N/A;   MELANOMA EXCISION     removed from Left calf 1994   Family History  Problem Relation Age of Onset   Hodgkin's lymphoma Mother    Heart failure Father    Heart attack Father    Arthritis Sister        Three sisters w/ degeneratve disk disease   Headache Sister        Two sisters hx of headache   Breast cancer Neg Hx    Colon cancer Neg Hx    Bladder Cancer Neg Hx    Kidney cancer Neg Hx    Social History   Socioeconomic History   Marital status: Widowed    Spouse name: Not on file   Number of children: 2   Years of education: Not on file   Highest education level: Not on file  Occupational History   Not on file  Tobacco Use   Smoking status: Former    Packs/day: 1.00    Years: 15.00    Pack years: 15.00    Types: Cigarettes   Smokeless tobacco: Never  Vaping Use   Vaping Use: Never used  Substance and Sexual Activity   Alcohol use: No    Alcohol/week: 0.0 standard drinks   Drug use: No   Sexual activity: Not on file  Other Topics Concern   Not on file  Social History Narrative   Married and has 2 children, daughters.   Social Determinants of Health   Financial Resource Strain: Not on file  Food Insecurity: Not on file  Transportation Needs: Not on file  Physical Activity: Not on file  Stress: Not on file  Social Connections: Not on file     Review of Systems  Constitutional:  Negative for appetite change and unexpected weight change.  HENT:  Negative for congestion and  sinus pressure.   Respiratory:  Negative for cough, chest tightness and shortness of breath.   Cardiovascular:  Negative for chest pain, palpitations and leg swelling.  Gastrointestinal:  Negative for abdominal pain, diarrhea, nausea and vomiting.  Genitourinary:  Negative for difficulty urinating and dysuria.       Cloudy urine.  Vaginal irritation.   Musculoskeletal:  Negative for joint swelling and myalgias.  Skin:  Negative for color change and rash.  Neurological:  Negative for dizziness, light-headedness and headaches.  Psychiatric/Behavioral:  Negative for agitation and dysphoric mood.       Objective:     BP 130/80   Pulse 88   Temp 97.7 F (36.5 C) (Oral)   Ht _0  (1.651 m)   Wt 150 lb 9.6 oz (68.3 kg)   LMP 08/09/2012   SpO2 99%   BMI 25.06 kg/m  Wt Readings from Last 3 Encounters:  08/12/21 150 lb 9.6 oz (68.3 kg)  05/21/21 152 lb 3.2 oz (69 kg)  03/22/21 152 lb 9.6 oz (69.2 kg)    Physical Exam Vitals reviewed.  Constitutional:      General: She is not in acute distress.    Appearance: Normal appearance.  HENT:     Head: Normocephalic and atraumatic.     Right Ear: External ear normal.     Left Ear: External ear normal.  Eyes:     General: No scleral icterus.       Right eye: No discharge.        Left eye: No discharge.     Conjunctiva/sclera: Conjunctivae normal.  Neck:     Thyroid: No thyromegaly.  Cardiovascular:     Rate and Rhythm: Normal rate and regular rhythm.  Pulmonary:     Effort: No respiratory distress.     Breath sounds: Normal breath sounds. No wheezing.  Abdominal:     General: Bowel sounds are normal.     Palpations: Abdomen is soft.     Tenderness: There is no abdominal tenderness.  Genitourinary:    Comments: Minimal erythema - outer vaginal area.   Musculoskeletal:        General: No swelling or tenderness.     Cervical back: Neck supple. No tenderness.  Lymphadenopathy:     Cervical: No cervical adenopathy.  Skin:     Findings: No erythema or rash.  Neurological:     Mental Status: She is alert.  Psychiatric:        Mood and Affect: Mood normal.        Behavior: Behavior normal.     Outpatient Encounter Medications as of 08/12/2021  Medication Sig   acetaminophen (TYLENOL) 325 MG tablet Take 650 mg by mouth as needed.   betamethasone dipropionate 0.05 % cream Apply topically 2 (two) times daily. Do no use more that 7-10 days without calling.   esomeprazole (NEXIUM) 40 MG capsule Take 1 capsule (40 mg total) by mouth daily.   hydrALAZINE (APRESOLINE) 25 MG tablet Take 1 tablet (25 mg total) by mouth 2 (two) times daily.   ibuprofen (ADVIL,MOTRIN) 200 MG tablet Take 200 mg by mouth every 6 (six) hours as needed.   imipramine (TOFRANIL) 10 MG tablet Take 1 tablet (10 mg total) by mouth at bedtime.   lovastatin (MEVACOR) 20 MG tablet Take 1 tablet (20 mg total) by mouth at bedtime.   mupirocin ointment (BACTROBAN) 2 % Apply 1 application topically daily. With dressing changes   nystatin cream (MYCOSTATIN) Apply 1 application topically 2 (two) times daily.   triamcinolone cream (KENALOG) 0.1 % Apply 1 application topically 2 (two) times daily as needed.   trospium (SANCTURA) 20 MG tablet Take 1 tablet (20 mg total) by mouth daily.   [DISCONTINUED] busPIRone (BUSPAR) 5 MG tablet Take 1 tablet by mouth twice daily   busPIRone (BUSPAR) 5 MG tablet Take 1 tablet (5 mg total) by mouth 2 (two) times daily.   No facility-administered encounter medications on file as of  08/12/2021.     Lab Results  Component Value Date   WBC 5.9 01/29/2021   HGB 12.5 01/29/2021   HCT 37.2 01/29/2021   PLT 233.0 01/29/2021   GLUCOSE 88 05/21/2021   CHOL 158 05/21/2021   TRIG 67.0 05/21/2021   HDL 78.10 05/21/2021   LDLCALC 67 05/21/2021   ALT 10 05/21/2021   AST 16 05/21/2021   NA 137 05/21/2021   K 4.7 05/21/2021   CL 100 05/21/2021   CREATININE 1.00 05/21/2021   BUN 7 05/21/2021   CO2 28 05/21/2021   TSH 3.90  12/04/2020   HGBA1C 5.8 05/21/2021       Assessment & Plan:   Problem List Items Addressed This Visit     Aortic atherosclerosis ([place])    Continue mevacor.       Constipation    Discussed taking miralax daily.  Follow.       Dysuria - Primary    Appears to be more vaginal burning.  Has noticed cloudy urine.  Check urine to confirm no infection.       Relevant Orders   Urinalysis, Routine w reflex microscopic (Completed)   Urine Culture (Completed)   Essential hypertension    Intolerant to amlodipine.  Blood pressure elevated.  Take hydralazine as ordered.  Follow pressures.  Follow metabolic panel.       Relevant Orders   Basic metabolic panel   GERD (gastroesophageal reflux disease)    No upper symptoms reported.  Continue nexium.       Hypercholesterolemia    Continue mevacor.  Low cholesterol diet and exercise.  Follow lipid panel and liver function tests.       Relevant Orders   Hepatic function panel   Lipid panel   TSH   Hyperglycemia    Low carb diet and exercise. Follow met b and a1c.       Relevant Orders   Hemoglobin A1c   Stress    Taking buspar. Will notify me if feels needs anything more. Follow.       Vaginal irritation    Nystatin cream did not help.  Trial of betamethasone cream.  Follow.  If persistent, will refer to gyn.         Einar Pheasant, MD

## 2021-08-13 ENCOUNTER — Encounter: Payer: Self-pay | Admitting: Internal Medicine

## 2021-08-13 DIAGNOSIS — K5900 Constipation, unspecified: Secondary | ICD-10-CM | POA: Insufficient documentation

## 2021-08-13 DIAGNOSIS — N898 Other specified noninflammatory disorders of vagina: Secondary | ICD-10-CM | POA: Insufficient documentation

## 2021-08-13 LAB — URINE CULTURE
MICRO NUMBER:: 12633329
Result:: NO GROWTH
SPECIMEN QUALITY:: ADEQUATE

## 2021-08-13 LAB — URINALYSIS, ROUTINE W REFLEX MICROSCOPIC
Bilirubin Urine: NEGATIVE
Hgb urine dipstick: NEGATIVE
Ketones, ur: NEGATIVE
Leukocytes,Ua: NEGATIVE
Nitrite: NEGATIVE
Specific Gravity, Urine: <=1.005 — AB (ref 1.000–1.030)
Total Protein, Urine: 30 — AB
Urine Glucose: NEGATIVE
Urobilinogen, UA: 0.2 (ref 0.0–1.0)
pH: 6.5 (ref 5.0–8.0)

## 2021-08-13 NOTE — Assessment & Plan Note (Signed)
Continue mevacor °

## 2021-08-13 NOTE — Assessment & Plan Note (Signed)
Appears to be more vaginal burning.  Has noticed cloudy urine.  Check urine to confirm no infection.

## 2021-08-13 NOTE — Assessment & Plan Note (Signed)
No upper symptoms reported.  Continue nexium.  

## 2021-08-13 NOTE — Assessment & Plan Note (Signed)
Low carb diet and exercise. Follow met b and a1c.

## 2021-08-13 NOTE — Assessment & Plan Note (Signed)
Continue mevacor.  Low cholesterol diet and exercise.  Follow lipid panel and liver function tests.  

## 2021-08-13 NOTE — Assessment & Plan Note (Signed)
Nystatin cream did not help.  Trial of betamethasone cream.  Follow.  If persistent, will refer to gyn.

## 2021-08-13 NOTE — Assessment & Plan Note (Signed)
Discussed taking miralax daily.  Follow.

## 2021-08-13 NOTE — Assessment & Plan Note (Signed)
Intolerant to amlodipine.  Blood pressure elevated.  Take hydralazine as ordered.  Follow pressures.  Follow metabolic panel.

## 2021-08-13 NOTE — Assessment & Plan Note (Signed)
Taking buspar. Will notify me if feels needs anything more. Follow.

## 2021-08-14 ENCOUNTER — Ambulatory Visit: Payer: Medicare PPO | Admitting: Dermatology

## 2021-09-11 ENCOUNTER — Other Ambulatory Visit: Payer: Self-pay | Admitting: Internal Medicine

## 2021-09-11 MED ORDER — HYDRALAZINE HCL 25 MG PO TABS: 25.0000 mg | ORAL_TABLET | Freq: Two times a day (BID) | ORAL | 2 refills | Status: DC

## 2021-09-11 NOTE — Telephone Encounter (Signed)
Please clarify that this was done. It states done and was closed, but I don't see where it was sent in.  If not refilled, ok to refill.

## 2021-09-11 NOTE — Telephone Encounter (Signed)
I had gotten two requests. It did look like both were denied. I have refilled.

## 2021-09-11 NOTE — Addendum Note (Signed)
Addended by: Angus Seller on: 09/11/2021 01:35 PM   Modules accepted: Orders

## 2021-10-02 ENCOUNTER — Ambulatory Visit: Payer: Medicare PPO | Admitting: Dermatology

## 2021-10-02 ENCOUNTER — Other Ambulatory Visit: Payer: Self-pay

## 2021-10-02 DIAGNOSIS — L578 Other skin changes due to chronic exposure to nonionizing radiation: Secondary | ICD-10-CM

## 2021-10-02 DIAGNOSIS — D489 Neoplasm of uncertain behavior, unspecified: Secondary | ICD-10-CM

## 2021-10-02 DIAGNOSIS — D229 Melanocytic nevi, unspecified: Secondary | ICD-10-CM

## 2021-10-02 DIAGNOSIS — C4491 Basal cell carcinoma of skin, unspecified: Secondary | ICD-10-CM

## 2021-10-02 DIAGNOSIS — Z1283 Encounter for screening for malignant neoplasm of skin: Secondary | ICD-10-CM | POA: Diagnosis not present

## 2021-10-02 DIAGNOSIS — C44519 Basal cell carcinoma of skin of other part of trunk: Secondary | ICD-10-CM | POA: Diagnosis not present

## 2021-10-02 DIAGNOSIS — D2371 Other benign neoplasm of skin of right lower limb, including hip: Secondary | ICD-10-CM | POA: Diagnosis not present

## 2021-10-02 DIAGNOSIS — C44719 Basal cell carcinoma of skin of left lower limb, including hip: Secondary | ICD-10-CM | POA: Diagnosis not present

## 2021-10-02 DIAGNOSIS — D1801 Hemangioma of skin and subcutaneous tissue: Secondary | ICD-10-CM

## 2021-10-02 DIAGNOSIS — Z86008 Personal history of in-situ neoplasm of other site: Secondary | ICD-10-CM

## 2021-10-02 DIAGNOSIS — L814 Other melanin hyperpigmentation: Secondary | ICD-10-CM | POA: Diagnosis not present

## 2021-10-02 DIAGNOSIS — C44712 Basal cell carcinoma of skin of right lower limb, including hip: Secondary | ICD-10-CM | POA: Diagnosis not present

## 2021-10-02 DIAGNOSIS — Z85828 Personal history of other malignant neoplasm of skin: Secondary | ICD-10-CM

## 2021-10-02 DIAGNOSIS — L821 Other seborrheic keratosis: Secondary | ICD-10-CM

## 2021-10-02 DIAGNOSIS — L570 Actinic keratosis: Secondary | ICD-10-CM

## 2021-10-02 HISTORY — DX: Basal cell carcinoma of skin, unspecified: C44.91

## 2021-10-02 NOTE — Progress Notes (Signed)
Follow-Up Visit   Subjective  Rachael Austin is a 81 y.o. female who presents for the following: Follow-up (Patient here today for treatment on left foot, right mid back, right upper back, and left upper back. Patient also reports a spot at right hand she would like checked. ).  Patient here for full body skin exam and skin cancer screening.  The following portions of the chart were reviewed this encounter and updated as appropriate:  Tobacco   Allergies   Meds   Problems   Med Hx   Surg Hx   Fam Hx       Review of Systems: No other skin or systemic complaints except as noted in HPI or Assessment and Plan.   Objective  Well appearing patient in no apparent distress; mood and affect are within normal limits.  A full examination was performed including scalp, head, eyes, ears, nose, lips, neck, chest, axillae, abdomen, back, buttocks, bilateral upper extremities, bilateral lower extremities, hands, feet, fingers, toes, fingernails, and toenails. All findings within normal limits unless otherwise noted below.  left dorsal foot Healing scar   left dorsal foot proximal 0.9 cm thin scaly pink papule        left mid back 2.6 cm thin scaly red patch        right pretibia 0.3 cm pink papule        right mid back 1.2 cm thin pink plaque        Right Shoulder - Posterior 1.1 cm pink patch     Right Shoulder - Anterior 1.2 cm pink patch     Right Upper Arm 1.2 cm pink patch     Left Upper Back 0.6 cm thin pink papule     Right Upper Back 0.7 cm thin pink papule     right 2nd mcp x 1, left hand x 1,  right dorsal hand x 3, right upper arm x 1 (6) Erythematous thin papules/macules with gritty scale.   right calf 0.7 cm lichenified pink papule without features suspicious for malignancy on dermoscopy    Assessment & Plan  Basal cell carcinoma (BCC) of skin of left lower extremity including hip left dorsal foot  Destruction of  lesion  Destruction method: electrodesiccation and curettage   Informed consent: discussed and consent obtained   Timeout:  patient name, date of birth, surgical site, and procedure verified Patient was prepped and draped in usual sterile fashion: area prepped with isopropyl alcohol. Anesthesia: the lesion was anesthetized in a standard fashion   Anesthetic:  1% lidocaine w/ epinephrine 1-100,000 buffered w/ 8.4% NaHCO3 Curettage performed in three different directions: Yes   Electrodesiccation performed over the curetted area: Yes   Curettage cycles:  3 Final wound size (cm):  1.2 Hemostasis achieved with:  electrodesiccation Outcome: patient tolerated procedure well with no complications   Post-procedure details: wound care instructions given   Additional details:  Mupirocin and a pressure dressing applied  Biopsy proven BASAL CELL CARCINOMA, NODULAR PATTERN, BASE INVOLVED  left dorsal foot   Neoplasm of uncertain behavior (9) left dorsal foot proximal  Skin / nail biopsy Type of biopsy: tangential   Informed consent: discussed and consent obtained   Timeout: patient name, date of birth, surgical site, and procedure verified   Patient was prepped and draped in usual sterile fashion: Area prepped with isopropyl alcohol. Anesthesia: the lesion was anesthetized in a standard fashion   Anesthetic:  1% lidocaine w/ epinephrine 1-100,000 buffered w/ 8.4% NaHCO3  Instrument used: flexible razor blade   Hemostasis achieved with: aluminum chloride   Outcome: patient tolerated procedure well   Post-procedure details: wound care instructions given   Additional details:  Mupirocin and a bandage applied  Destruction of lesion  Destruction method: electrodesiccation and curettage   Informed consent: discussed and consent obtained   Timeout:  patient name, date of birth, surgical site, and procedure verified Patient was prepped and draped in usual sterile fashion: area prepped with  isopropyl alcohol. Anesthesia: the lesion was anesthetized in a standard fashion   Anesthetic:  1% lidocaine w/ epinephrine 1-100,000 buffered w/ 8.4% NaHCO3 Curettage performed in three different directions: Yes   Electrodesiccation performed over the curetted area: Yes   Curettage cycles:  3 Final wound size (cm):  1.1 Hemostasis achieved with:  electrodesiccation Outcome: patient tolerated procedure well with no complications   Post-procedure details: wound care instructions given   Additional details:  Mupirocin and a pressure dressing applied  Specimen 1 - Surgical pathology Differential Diagnosis: r/o BCC  Check Margins: No  left mid back  Skin / nail biopsy Type of biopsy: tangential   Informed consent: discussed and consent obtained   Timeout: patient name, date of birth, surgical site, and procedure verified   Patient was prepped and draped in usual sterile fashion: Area prepped with isopropyl alcohol. Anesthesia: the lesion was anesthetized in a standard fashion   Anesthetic:  1% lidocaine w/ epinephrine 1-100,000 buffered w/ 8.4% NaHCO3 Instrument used: flexible razor blade   Hemostasis achieved with: aluminum chloride   Outcome: patient tolerated procedure well   Post-procedure details: wound care instructions given   Additional details:  Mupirocin and a bandage applied  Specimen 2 - Surgical pathology Differential Diagnosis: r/o BCC   Check Margins: No  right pretibia  Skin / nail biopsy Type of biopsy: tangential   Informed consent: discussed and consent obtained   Timeout: patient name, date of birth, surgical site, and procedure verified   Patient was prepped and draped in usual sterile fashion: Area prepped with isopropyl alcohol. Anesthesia: the lesion was anesthetized in a standard fashion   Anesthetic:  1% lidocaine w/ epinephrine 1-100,000 buffered w/ 8.4% NaHCO3 Instrument used: flexible razor blade   Hemostasis achieved with: aluminum chloride    Outcome: patient tolerated procedure well   Post-procedure details: wound care instructions given   Additional details:  Mupirocin and a bandage applied  Specimen 3 - Surgical pathology Differential Diagnosis: r/o BCC > amelanotic   Check Margins: No  right mid back  Skin / nail biopsy Type of biopsy: tangential   Informed consent: discussed and consent obtained   Timeout: patient name, date of birth, surgical site, and procedure verified   Patient was prepped and draped in usual sterile fashion: Area prepped with isopropyl alcohol. Anesthesia: the lesion was anesthetized in a standard fashion   Anesthetic:  1% lidocaine w/ epinephrine 1-100,000 buffered w/ 8.4% NaHCO3 Instrument used: flexible razor blade   Hemostasis achieved with: aluminum chloride   Outcome: patient tolerated procedure well   Post-procedure details: wound care instructions given   Additional details:  Mupirocin and a bandage applied  Specimen 4 - Surgical pathology Differential Diagnosis: r/o BCC  Check Margins: No  Right Shoulder - Posterior  Right Shoulder - Anterior  Right Upper Arm  Left Upper Back  Right Upper Back  TODAY  Right pretibia 0.3 cm pink papule  r/o BCC > amelanotic biopsy only today Left dorsal foot proximal 0.9 cm thin scaly  pink papule r/o BCC --> biopsy and ED&C today Left mid back 2.6 cm thin scaly red patch r/o BCC biopsy only today Right mid back 1.2 cm thin pink plaque r/o BCC biopsy only  FUTURE VISIT - plan biopsy + ED&C same day Right shoulder posterior - 1.1 cm pink patch r/o bcc  Right shoulder anterior - 1.2 cm pink patch r/o bcc  Right upper arm - 0.8 cm pink papule r/o bcc Left upper back 0.6 cm thin pink papule r/o superficial bcc  Right upper back 0.7 cm thin pink papule r/o BCC    Actinic keratosis (6) right 2nd mcp x 1, left hand x 1,  right dorsal hand x 3, right upper arm x 1  Hypertrophic ak at right 2nd mcp, left hand, right dorsal hand  Ak at  right upper arm consider biopsy if not resolved.       Actinic keratoses are precancerous spots that appear secondary to cumulative UV radiation exposure/sun exposure over time. They are chronic with expected duration over 1 year. A portion of actinic keratoses will progress to squamous cell carcinoma of the skin. It is not possible to reliably predict which spots will progress to skin cancer and so treatment is recommended to prevent development of skin cancer.  Recommend daily broad spectrum sunscreen SPF 30+ to sun-exposed areas, reapply every 2 hours as needed.  Recommend staying in the shade or wearing long sleeves, sun glasses (UVA+UVB protection) and wide brim hats (4-inch brim around the entire circumference of the hat). Call for new or changing lesions.  Destruction of lesion - right 2nd mcp x 1, left hand x 1,  right dorsal hand x 3, right upper arm x 1  Destruction method: cryotherapy   Informed consent: discussed and consent obtained   Lesion destroyed using liquid nitrogen: Yes   Cryotherapy cycles:  2 Outcome: patient tolerated procedure well with no complications   Post-procedure details: wound care instructions given   Additional details:  Prior to procedure, discussed risks of blister formation, small wound, skin dyspigmentation, or rare scar following cryotherapy. Recommend Vaseline ointment to treated areas while healing.   Benign neoplasm of skin of right lower extremity right calf  Consistent with purigo nodularis though skin cancer can not be ruled out   Patient defers biopsy.  Symptomatic  Will do trial of cryotherapy  Prior to procedure, discussed risks of blister formation, small wound, skin dyspigmentation, or rare scar following cryotherapy. Recommend Vaseline ointment to treated areas while healing.    Destruction of lesion - right calf  Destruction method: cryotherapy   Informed consent: discussed and consent obtained   Lesion destroyed using  liquid nitrogen: Yes   Cryotherapy cycles:  2 Outcome: patient tolerated procedure well with no complications   Post-procedure details: wound care instructions given   Additional details:  Prior to procedure, discussed risks of blister formation, small wound, skin dyspigmentation, or rare scar following cryotherapy. Recommend Vaseline ointment to treated areas while healing.   Lentigines - Scattered tan macules - Due to sun exposure - Benign-appearing, observe - Recommend daily broad spectrum sunscreen SPF 30+ to sun-exposed areas, reapply every 2 hours as needed. - Call for any changes  Seborrheic Keratoses - Stuck-on, waxy, tan-brown papules and/or plaques  - Benign-appearing - Discussed benign etiology and prognosis. - Observe - Call for any changes  Melanocytic Nevi - Tan-brown and/or pink-flesh-colored symmetric macules and papules - Benign appearing on exam today - Observation - Call clinic for new or  changing moles - Recommend daily use of broad spectrum spf 30+ sunscreen to sun-exposed areas.   Hemangiomas - Red papules - Discussed benign nature - Observe - Call for any changes  Actinic Damage - Chronic condition, secondary to cumulative UV/sun exposure - diffuse scaly erythematous macules with underlying dyspigmentation - Recommend daily broad spectrum sunscreen SPF 30+ to sun-exposed areas, reapply every 2 hours as needed.  - Staying in the shade or wearing long sleeves, sun glasses (UVA+UVB protection) and wide brim hats (4-inch brim around the entire circumference of the hat) are also recommended for sun protection.  - Call for new or changing lesions.  History of Basal Cell Carcinoma of the Skin - No evidence of recurrence today at multiple locations see history  - Recommend regular full body skin exams - Recommend daily broad spectrum sunscreen SPF 30+ to sun-exposed areas, reapply every 2 hours as needed.  - Call if any new or changing lesions are noted  between office visits  History of Squamous Cell Carcinoma of the Skin - No evidence of recurrence today at multiple locations see patient history  - No lymphadenopathy - Recommend regular full body skin exams - Recommend daily broad spectrum sunscreen SPF 30+ to sun-exposed areas, reapply every 2 hours as needed.  - Call if any new or changing lesions are noted between office visits   History of Squamous Cell Carcinoma in Situ of the Skin - No evidence of recurrence today left dorsal forearm and left lateral calf 2021 - Recommend regular full body skin exams - Recommend daily broad spectrum sunscreen SPF 30+ to sun-exposed areas, reapply every 2 hours as needed.  - Call if any new or changing lesions are noted between office visits  Skin cancer screening performed today. Return for 1 month follow up biospies and ED&C's.    I, Ruthell Rummage, CMA, am acting as scribe for Forest Gleason, MD.  Documentation: I have reviewed the above documentation for accuracy and completeness, and I agree with the above.  Forest Gleason, MD

## 2021-10-02 NOTE — Patient Instructions (Addendum)
For dressings at back can leave on for 2 - 3 days before removing   Wash wound cover with mupirocin ointment and cover with bandage. If irritated can use blister band aids    Electrodesiccation and Curettage (Scrape and Burn) Wound Care Instructions  Leave the original bandage on for 24 hours if possible.  If the bandage becomes soaked or soiled before that time, it is OK to remove it and examine the wound.  A small amount of post-operative bleeding is normal.  If excessive bleeding occurs, remove the bandage, place gauze over the site and apply continuous pressure (no peeking) over the area for 30 minutes. If this does not work, please call our clinic as soon as possible or page your doctor if it is after hours.   Once a day, cleanse the wound with soap and water. It is fine to shower. If a thick crust develops you may use a Q-tip dipped into dilute hydrogen peroxide (mix 1:1 with water) to dissolve it.  Hydrogen peroxide can slow the healing process, so use it only as needed.    After washing, apply petroleum jelly (Vaseline) or an antibiotic ointment if your doctor prescribed one for you, followed by a bandage.    For best healing, the wound should be covered with a layer of ointment at all times. If you are not able to keep the area covered with a bandage to hold the ointment in place, this may mean re-applying the ointment several times a day.  Continue this wound care until the wound has healed and is no longer open. It may take several weeks for the wound to heal and close.  Itching and mild discomfort is normal during the healing process.  If you have any discomfort, you can take Tylenol (acetaminophen) or ibuprofen as directed on the bottle. (Please do not take these if you have an allergy to them or cannot take them for another reason).  Some redness, tenderness and white or yellow material in the wound is normal healing.  If the area becomes very sore and red, or develops a thick  yellow-green material (pus), it may be infected; please notify us.    Wound healing continues for up to one year following surgery. It is not unusual to experience pain in the scar from time to time during the interval.  If the pain becomes severe or the scar thickens, you should notify the office.    A slight amount of redness in a scar is expected for the first six months.  After six months, the redness will fade and the scar will soften and fade.  The color difference becomes less noticeable with time.  If there are any problems, return for a post-op surgery check at your earliest convenience.  To improve the appearance of the scar, you can use silicone scar gel, cream, or sheets (such as Mederma or Serica) every night for up to one year. These are available over the counter (without a prescription).  Please call our office at [phone] for any questions or concerns.  Biopsy Wound Care Instructions  Leave the original bandage on for 24 hours if possible.  If the bandage becomes soaked or soiled before that time, it is OK to remove it and examine the wound.  A small amount of post-operative bleeding is normal.  If excessive bleeding occurs, remove the bandage, place gauze over the site and apply continuous pressure (no peeking) over the area for 30 minutes. If this does not  work, please call our clinic as soon as possible or page your doctor if it is after hours.   Once a day, cleanse the wound with soap and water. It is fine to shower. If a thick crust develops you may use a Q-tip dipped into dilute hydrogen peroxide (mix 1:1 with water) to dissolve it.  Hydrogen peroxide can slow the healing process, so use it only as needed.    After washing, apply petroleum jelly (Vaseline) or an antibiotic ointment if your doctor prescribed one for you, followed by a bandage.    For best healing, the wound should be covered with a layer of ointment at all times. If you are not able to keep the area  covered with a bandage to hold the ointment in place, this may mean re-applying the ointment several times a day.  Continue this wound care until the wound has healed and is no longer open.   Itching and mild discomfort is normal during the healing process. However, if you develop pain or severe itching, please call our office.   If you have any discomfort, you can take Tylenol (acetaminophen) or ibuprofen as directed on the bottle. (Please do not take these if you have an allergy to them or cannot take them for another reason).  Some redness, tenderness and white or yellow material in the wound is normal healing.  If the area becomes very sore and red, or develops a thick yellow-green material (pus), it may be infected; please notify us.    If you have stitches, return to clinic as directed to have the stitches removed. You will continue wound care for 2-3 days after the stitches are removed.   Wound healing continues for up to one year following surgery. It is not unusual to experience pain in the scar from time to time during the interval.  If the pain becomes severe or the scar thickens, you should notify the office.    A slight amount of redness in a scar is expected for the first six months.  After six months, the redness will fade and the scar will soften and fade.  The color difference becomes less noticeable with time.  If there are any problems, return for a post-op surgery check at your earliest convenience.  To improve the appearance of the scar, you can use silicone scar gel, cream, or sheets (such as Mederma or Serica) every night for up to one year. These are available over the counter (without a prescription).  Please call our office at [phone] for any questions or concerns.   Actinic keratoses are precancerous spots that appear secondary to cumulative UV radiation exposure/sun exposure over time. They are chronic with expected duration over 1 year. A portion of actinic  keratoses will progress to squamous cell carcinoma of the skin. It is not possible to reliably predict which spots will progress to skin cancer and so treatment is recommended to prevent development of skin cancer.  Recommend daily broad spectrum sunscreen SPF 30+ to sun-exposed areas, reapply every 2 hours as needed.  Recommend staying in the shade or wearing long sleeves, sun glasses (UVA+UVB protection) and wide brim hats (4-inch brim around the entire circumference of the hat). Call for new or changing lesions.   Cryotherapy Aftercare  Wash gently with soap and water everyday.   Apply Vaseline and Band-Aid daily until healed.    Melanoma ABCDEs  Melanoma is the most dangerous type of skin cancer, and is the leading cause of  death from skin disease.  You are more likely to develop melanoma if you: Have light-colored skin, light-colored eyes, or red or blond hair Spend a lot of time in the sun Tan regularly, either outdoors or in a tanning bed Have had blistering sunburns, especially during childhood Have a close family member who has had a melanoma Have atypical moles or large birthmarks  Early detection of melanoma is key since treatment is typically straightforward and cure rates are extremely high if we catch it early.   The first sign of melanoma is often a change in a mole or a new dark spot.  The ABCDE system is a way of remembering the signs of melanoma.  A for asymmetry:  The two halves do not match. B for border:  The edges of the growth are irregular. C for color:  A mixture of colors are present instead of an even brown color. D for diameter:  Melanomas are usually (but not always) greater than 87mm - the size of a pencil eraser. E for evolution:  The spot keeps changing in size, shape, and color.  Please check your skin once per month between visits. You can use a small mirror in front and a large mirror behind you to keep an eye on the back side or your body.   If you  see any new or changing lesions before your next follow-up, please call to schedule a visit.  Please continue daily skin protection including broad spectrum sunscreen SPF 30+ to sun-exposed areas, reapplying every 2 hours as needed when you're outdoors.    If You Need Anything After Your Visit  If you have any questions or concerns for your doctor, please call our main line at [phone] and press option 4 to reach your doctor's medical assistant. If no one answers, please leave a voicemail as directed and we will return your call as soon as possible. Messages left after 4 pm will be answered the following business day.   You may also send [place] a message via [place]. We typically respond to MyChart messages within 1-2 business days.  For prescription refills, please ask your pharmacy to contact our office. Our fax number is [phone].  If you have an urgent issue when the clinic is closed that cannot wait until the next business day, you can page your doctor at the number below.    Please note that while we do our best to be available for urgent issues outside of office hours, we are not available 24/7.   If you have an urgent issue and are unable to reach [place], you may choose to seek medical care at your doctor's office, retail clinic, urgent care center, or emergency room.  If you have a medical emergency, please immediately call 911 or go to the emergency department.  Pager Numbers  - Dr. Nehemiah Massed: [phone]  - Dr. Laurence Ferrari: [phone]  - Dr. Nicole Kindred: [phone]  In the event of inclement weather, please call our main line at [phone] for an update on the status of any delays or closures.  Dermatology Medication Tips: Please keep the boxes that topical medications come in in order to help keep track of the instructions about where and how to use these. Pharmacies typically print the medication instructions only on the boxes and not directly on the medication tubes.   If  your medication is too expensive, please contact our office at [phone] option 4 or send [place] a message through [place].   We are  unable to tell what your co-pay for medications will be in advance as this is different depending on your insurance coverage. However, we may be able to find a substitute medication at lower cost or fill out paperwork to get insurance to cover a needed medication.   If a prior authorization is required to get your medication covered by your insurance company, please allow [place] 1-2 business days to complete this process.  Drug prices often vary depending on where the prescription is filled and some pharmacies may offer cheaper prices.  The website www.goodrx.com contains coupons for medications through different pharmacies. The prices here do not account for what the cost may be with help from insurance (it may be cheaper with your insurance), but the website can give you the price if you did not use any insurance.  - You can print the associated coupon and take it with your prescription to the pharmacy.  - You may also stop by our office during regular business hours and pick up a GoodRx coupon card.  - If you need your prescription sent electronically to a different pharmacy, notify our office through [facility] or by phone at [phone] option 4.     Si Usted Necesita Algo Despus de Su Visita  Tambin puede enviarnos un mensaje a travs de Pharmacist, community. Por lo general respondemos a los mensajes de MyChart en el transcurso de 1 a 2 das hbiles.  Para renovar recetas, por favor pida a su farmacia que se ponga en contacto con nuestra oficina. Harland Dingwall de fax es [place] [phone].  Si tiene un asunto urgente cuando la clnica est cerrada y que no puede esperar hasta el siguiente da hbil, puede llamar/localizar a su doctor(a) al nmero que aparece a continuacin.   Por favor, tenga en cuenta que aunque hacemos todo lo posible para estar disponibles para  asuntos urgentes fuera del horario de [place], no estamos disponibles las 24 horas del da, los 7 das de la [place].   Si tiene un problema urgente y no puede comunicarse con nosotros, puede optar por buscar atencin mdica  en el consultorio de su doctor(a), en una clnica privada, en un centro de atencin urgente o en una sala de emergencias.  Si tiene Engineering geologist, por favor llame inmediatamente al 911 o vaya a la sala de emergencias.  Nmeros de bper  - Dr. Nehemiah Massed: [phone]  - Dra. Moye: [phone]  - Dra. Nicole Kindred: [phone]  En caso de inclemencias del [place], por favor llame a Johnsie Kindred principal al [phone] para una actualizacin sobre el [place] de cualquier retraso o cierre.  Consejos para la medicacin en dermatologa: Por favor, guarde las cajas en las que vienen los medicamentos de uso tpico para ayudarle a seguir las instrucciones sobre dnde y cmo usarlos. Las farmacias generalmente imprimen las instrucciones del medicamento slo en las cajas y no directamente en los tubos del [place].   Si su medicamento es muy caro, por favor, pngase en contacto con Zigmund Daniel llamando al [phone] y presione la opcin 4 o envenos un mensaje a travs de Pharmacist, community.   No podemos decirle cul ser su copago por los medicamentos por adelantado ya que esto es diferente dependiendo de la cobertura de su seguro. Sin embargo, es posible que podamos encontrar un medicamento sustituto a Electrical engineer un formulario para que el seguro cubra el medicamento que se considera necesario.   Si se requiere una autorizacin previa para que su compaa de seguros  cubra su medicamento, por favor permtanos de 1 a 2 das hbiles para completar este proceso.  Los precios de los medicamentos varan con frecuencia dependiendo del Environmental consultant de dnde se surte la receta y alguna farmacias pueden ofrecer precios ms baratos.  El sitio web www.goodrx.com tiene cupones para  medicamentos de Airline pilot. Los precios aqu no tienen en cuenta lo que podra costar con la ayuda del seguro (puede ser ms barato con su seguro), pero el sitio web puede darle el precio si no utiliz Research scientist (physical sciences).  - Puede imprimir el cupn correspondiente y llevarlo con su receta a la farmacia.  - Tambin puede pasar por nuestra oficina durante el horario de atencin regular y Charity fundraiser una tarjeta de cupones de GoodRx.  - Si necesita que su receta se enve electrnicamente a una farmacia diferente, informe a nuestra oficina a travs de MyChart de  o por telfono llamando al [phone] y presione la opcin 4.

## 2021-10-08 ENCOUNTER — Other Ambulatory Visit: Payer: Medicare PPO

## 2021-10-08 ENCOUNTER — Ambulatory Visit: Payer: Medicare PPO | Admitting: Adult Health

## 2021-10-09 ENCOUNTER — Telehealth: Payer: Self-pay

## 2021-10-09 ENCOUNTER — Telehealth: Payer: Self-pay | Admitting: Internal Medicine

## 2021-10-09 ENCOUNTER — Other Ambulatory Visit (INDEPENDENT_AMBULATORY_CARE_PROVIDER_SITE_OTHER): Payer: Medicare PPO

## 2021-10-09 ENCOUNTER — Other Ambulatory Visit: Payer: Self-pay

## 2021-10-09 ENCOUNTER — Encounter: Payer: Self-pay | Admitting: Dermatology

## 2021-10-09 DIAGNOSIS — R739 Hyperglycemia, unspecified: Secondary | ICD-10-CM | POA: Diagnosis not present

## 2021-10-09 DIAGNOSIS — E7800 Pure hypercholesterolemia, unspecified: Secondary | ICD-10-CM

## 2021-10-09 DIAGNOSIS — I10 Essential (primary) hypertension: Secondary | ICD-10-CM

## 2021-10-09 LAB — LIPID PANEL
Cholesterol: 174 mg/dL (ref 0–200)
HDL: 68.5 mg/dL (ref >39.00)
LDL Cholesterol: 90 mg/dL (ref 0–99)
NonHDL: 105.57
Total CHOL/HDL Ratio: 3
Triglycerides: 79 mg/dL (ref 0.0–149.0)
VLDL: 15.8 mg/dL (ref 0.0–40.0)

## 2021-10-09 LAB — BASIC METABOLIC PANEL
BUN: 12 mg/dL (ref 6–23)
CO2: 29 mEq/L (ref 19–32)
Calcium: 9.2 mg/dL (ref 8.4–10.5)
Chloride: 105 mEq/L (ref 96–112)
Creatinine, Ser: 0.93 mg/dL (ref 0.40–1.20)
GFR: 57.55 mL/min — ABNORMAL LOW (ref >60.00)
Glucose, Bld: 96 mg/dL (ref 70–99)
Potassium: 4.2 mEq/L (ref 3.5–5.1)
Sodium: 141 mEq/L (ref 135–145)

## 2021-10-09 LAB — HEPATIC FUNCTION PANEL
ALT: 12 U/L (ref 0–35)
AST: 14 U/L (ref 0–37)
Albumin: 4.2 g/dL (ref 3.5–5.2)
Alkaline Phosphatase: 63 U/L (ref 39–117)
Bilirubin, Direct: 0.2 mg/dL (ref 0.0–0.3)
Total Bilirubin: 0.7 mg/dL (ref 0.2–1.2)
Total Protein: 6.1 g/dL (ref 6.0–8.3)

## 2021-10-09 LAB — HEMOGLOBIN A1C: Hgb A1c MFr Bld: 5.8 % (ref 4.6–6.5)

## 2021-10-09 LAB — TSH: TSH: 2.1 u[IU]/mL (ref 0.35–5.50)

## 2021-10-09 NOTE — Telephone Encounter (Signed)
Pt called in stating that medication (busPIRone (BUSPAR) 5 MG tablet) is not working for her. Pt would like to request a new medication that works. Pt stated she cant wait until her appt. Pt stated she needs it real bad. Pt requesting callback

## 2021-10-09 NOTE — Telephone Encounter (Signed)
-----   Message from [place], MD sent at 10/09/2021  8:48 AM EST ----- Everything came back like we expected, and I suggest continuing with our original plan - we can take care of the spots at her back at her next visit, and then finish up with the spots at her right arm and right leg at the following visit to be caught up on her skin cancers.    1. Skin , left dorsal foot proximal BASAL CELL CARCINOMA, NODULAR AND INFILTRATIVE PATTERNS, BASE INVOLVED --> already treated with ED&C  2. Skin , left mid back BASAL CELL CARCINOMA, SUPERFICIAL AND NODULAR PATTERNS, BASE INVOLVED --> plan ED&C  3. Skin , right pretibia BASAL CELL CARCINOMA, NODULAR PATTERN, PERIPHERAL MARGIN INVOLVED --> plan ED&C  4. Skin , right mid back SUPERFICIAL BASAL CELL CARCINOMA, CLOSE TO MARGIN --> plan ED&C    MAs please call. Thank you!

## 2021-10-09 NOTE — Telephone Encounter (Signed)
Patient advised of BX results and advised to keep follow up appt. aw

## 2021-10-09 NOTE — Telephone Encounter (Signed)
I called patient & she stated that the Buspar is just not working. Pt stated that she is tense constantly & not able to relax. She becomes worried & upset over small things that used to not bother her. She said that she worries about getting older & taking care of her home as she ages. She does not have any CP, SOB, does not feel that chest is tight or anything crushing her chest. She also does not have dizziness, left arm pain or jaw pain. She did say that her vision has changed but this has been more gradual since appointment last February & has not been since her anxiety/anxiousness has worsened. She feels she just needs to see her ophthalmologist to have have eyes check for new rx. I advised that we would call her tomorrow with trying to move up appointment but wanted to make sure that she did not need evaluation tonight.

## 2021-10-09 NOTE — Telephone Encounter (Signed)
Reviewed message. Please call her and confirm doing ok.  We can see about working her in for earlier appt to discuss treatment options.  If acute symptoms, will need to be seen.

## 2021-10-10 NOTE — Telephone Encounter (Signed)
Patient declined referral but advised we would hold for work in appt

## 2021-10-10 NOTE — Telephone Encounter (Signed)
See me about this before calling Ms Santino.  Will try to work in earlier - on 10/24/21.   We have tried zoloft and she has been on buspar.  Also, given her persistent issues, see if she would be agreeable to see psychiatry or counselor.

## 2021-10-13 ENCOUNTER — Ambulatory Visit
Admission: EM | Admit: 2021-10-13 | Discharge: 2021-10-13 | Disposition: A | Discharge status: Other Acute Inpatient Hospital | Payer: Medicare PPO | Attending: Emergency Medicine | Admitting: Emergency Medicine

## 2021-10-13 ENCOUNTER — Emergency Department: Payer: Medicare PPO

## 2021-10-13 ENCOUNTER — Other Ambulatory Visit: Payer: Self-pay

## 2021-10-13 ENCOUNTER — Encounter: Payer: Self-pay | Admitting: Emergency Medicine

## 2021-10-13 ENCOUNTER — Emergency Department
Admission: EM | Admit: 2021-10-13 | Discharge: 2021-10-13 | Disposition: A | Discharge status: Home / Self Care | Payer: Medicare PPO | Attending: Emergency Medicine | Admitting: Emergency Medicine

## 2021-10-13 DIAGNOSIS — R0602 Shortness of breath: Secondary | ICD-10-CM | POA: Insufficient documentation

## 2021-10-13 DIAGNOSIS — Z5321 Procedure and treatment not carried out due to patient leaving prior to being seen by health care provider: Secondary | ICD-10-CM | POA: Insufficient documentation

## 2021-10-13 DIAGNOSIS — R079 Chest pain, unspecified: Secondary | ICD-10-CM | POA: Insufficient documentation

## 2021-10-13 DIAGNOSIS — R059 Cough, unspecified: Secondary | ICD-10-CM | POA: Insufficient documentation

## 2021-10-13 LAB — CBC
HCT: 40 % (ref 36.0–46.0)
Hemoglobin: 13 g/dL (ref 12.0–15.0)
MCH: 30.1 pg (ref 26.0–34.0)
MCHC: 32.5 g/dL (ref 30.0–36.0)
MCV: 92.6 fL (ref 80.0–100.0)
Platelets: 257 10*3/uL (ref 150–400)
RBC: 4.32 MIL/uL (ref 3.87–5.11)
RDW: 13.8 % (ref 11.5–15.5)
WBC: 7.4 10*3/uL (ref 4.0–10.5)
nRBC: 0 % (ref 0.0–0.2)

## 2021-10-13 LAB — BASIC METABOLIC PANEL
Anion gap: 8 (ref 5–15)
BUN: 9 mg/dL (ref 8–23)
CO2: 25 mmol/L (ref 22–32)
Calcium: 9.2 mg/dL (ref 8.9–10.3)
Chloride: 104 mmol/L (ref 98–111)
Creatinine, Ser: 0.94 mg/dL (ref 0.44–1.00)
GFR, Estimated: >60 mL/min (ref >60)
Glucose, Bld: 112 mg/dL — ABNORMAL HIGH (ref 70–99)
Potassium: 4.3 mmol/L (ref 3.5–5.1)
Sodium: 137 mmol/L (ref 135–145)

## 2021-10-13 LAB — TROPONIN I (HIGH SENSITIVITY)
Troponin I (High Sensitivity): 3 ng/L (ref <18)
Troponin I (High Sensitivity): 4 ng/L (ref <18)

## 2021-10-13 NOTE — ED Provider Notes (Signed)
Rachael Austin    CSN: [id number] Arrival date & time: 10/13/21  0940      History   Chief Complaint Chief Complaint  Patient presents with   Fatigue   Generalized Body Aches   Abdominal Pain    HPI Rachael Austin is a 81 y.o. female.  Accompanied by her sister, patient presents with chest pressure and "heaviness" for several days.  She also reports shortness of breath and cough.  She had an episode last week of weakness in her legs but this resolved.  She has generalized weakness.  No focal weakness, fever, nausea, vomiting, or other symptoms.  Her medical history includes HTN, malignant melanoma, basal cell carcinoma.   The history is provided by the patient, a relative and medical records.   Past Medical History:  Diagnosis Date   Basal cell carcinoma 06/21/2020   left post shoulder sup, left mid chest, left lat thigh   Basal cell carcinoma (BCC) 06/13/2021   left dorsal foot   Basal cell carcinoma (BCC) 06/13/2021   right postauricular tx'd w/ EDC   BCC (basal cell carcinoma of skin) 03/30/2007   R inf med pretibial - BCC   BCC (basal cell carcinoma of skin) 10/12/2013   L nasal ala - BCC   BCC (basal cell carcinoma of skin) 03/10/2018   L mid dorsum med forearm - superficial BCC    BCC (basal cell carcinoma) 10/02/2021   left dorsal foot proximal, EDC 10/02/2021   BCC (basal cell carcinoma) 10/02/2021   left mid back, superficial   BCC (basal cell carcinoma) 10/02/2021   right pretibia   BCC (basal cell carcinoma) 10/02/2021   right mid back, superficial   Breast screening, unspecified 2013   Cancer ([place]) 1992   skin   Degenerative disk disease    GERD (gastroesophageal reflux disease)    History of basal cell carcinoma (BCC) 08/29/2020    left inferior knee lateral, , left inferior knee medial, right pretibia   History of SCC (squamous cell carcinoma) of skin 08/29/2020   right posterior calf, left lateral calf, and    Hypercholesterolemia 2008    Interstitial cystitis    followed by Dr Jacqlyn Larsen   Other sign and symptom in breast 2013   Left upper outer quadrant breast "soreness" Ultrasound exam of right breast in the 2 o'clock position with the breast distracted medially showed a 0.3-0.4 with 0.5 cm simple cyst. In the 1 o'clock position where pt reported tenderness US exam was negatiive.  Because of her history of intermittent nipple drainage, ultrasound was completed of the retroareolar area.   Personal history of tobacco use, presenting hazards to health    PONV (postoperative nausea and vomiting)    SCC (squamous cell carcinoma) 03/28/2008   R dorsum hand - SCC   SCC (squamous cell carcinoma) 05/09/2009   R lat lower leg - SCCIS   SCC (squamous cell carcinoma) 05/09/2009   L lat lower leg - SCCIS   SCC (squamous cell carcinoma) 04/07/2013   L lower leg - SCCIS   SCC (squamous cell carcinoma) 04/27/2013   R forearm - SCC   SCC (squamous cell carcinoma) 04/28/2017   L lat knee - SCCIS   SCC (squamous cell carcinoma) 05/12/2018   L prox dorsum forearm - SCC   SCC (squamous cell carcinoma) 06/13/2021   left forearm tx'd w/ EDC   SCC (squamous cell carcinoma), leg, right 03/30/2007   R sup pretibial - SCCIS  SCC (squamous cell carcinoma);BCC 10/12/2013   Mid back - superficial BCC with SCCIS    Special screening for malignant neoplasms, colon    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   left dorsal forearm, left lat calf    Patient Active Problem List   Diagnosis Date Noted   Vaginal irritation 08/13/2021   Constipation 08/13/2021   Dysuria 05/26/2021   Skin lesion 03/23/2021   Breast cancer screening 03/23/2021   Near syncope 01/29/2021   Hyperglycemia 10/18/2020   Left carotid bruit 04/07/2020   Leg lesion 04/07/2020   Leg pain 03/16/2020   Post-nasal drainage 01/29/2020   History of COVID-19 11/13/2019   Chest tightness 10/04/2017   Aortic atherosclerosis ([place]) 07/02/2017   Special screening for malignant  neoplasms, colon    Hiatal hernia    H/O: osteoarthritis 02/05/2016   H/O neoplasm 02/19/2015   H/O Malignant melanoma 02/19/2015   Essential hypertension 02/14/2015   Health care maintenance 02/14/2015   Abnormal involuntary movement 07/27/2014   Stress 05/09/2014   Abdominal pain 02/21/2014   Difficulty hearing 08/16/2013   GERD (gastroesophageal reflux disease) 06/17/2013   Acid reflux 06/17/2013   Detrusor dyssynergia 02/04/2013   Tremor    Hypercholesterolemia 08/09/2012   Chronic interstitial cystitis 08/09/2012    Past Surgical History:  Procedure Laterality Date   ABDOMINAL HYSTERECTOMY  1992   APPENDECTOMY     CERVICAL DISCECTOMY     S/P C7-T1 discectomy with fusion   CHOLECYSTECTOMY  1990   COLONOSCOPY WITH PROPOFOL N/A 02/14/2016   Procedure: COLONOSCOPY WITH PROPOFOL;  Surgeon: Lucilla Lame, MD;  Location: [place];  Service: Endoscopy;  Laterality: N/A;  PT WOULD LIKE 10 ARRIVAL TIME OR LATER   DILATION AND CURETTAGE OF UTERUS     ESOPHAGOGASTRODUODENOSCOPY (EGD) WITH PROPOFOL N/A 02/14/2016   Procedure: ESOPHAGOGASTRODUODENOSCOPY (EGD) WITH PROPOFOL with dialtion;  Surgeon: Lucilla Lame, MD;  Location: [facility];  Service: Endoscopy;  Laterality: N/A;   ESOPHAGOGASTRODUODENOSCOPY (EGD) WITH PROPOFOL N/A 04/13/2019   Procedure: ESOPHAGOGASTRODUODENOSCOPY (EGD) WITH PROPOFOL;  Surgeon: Toledo, Benay Pike, MD;  Location: [facility];  Service: Gastroenterology;  Laterality: N/A;   MELANOMA EXCISION     removed from Left calf 1994    OB History     Gravida  2   Para      Term      Preterm      AB      Living  2      SAB      IAB      Ectopic      Multiple      Live Births           Obstetric Comments  Age with first menstruation-13 Age with first pregnancy-17 LMP-age 31, hysterectomy          Home Medications    Prior to Admission medications   Medication Sig Start Date End Date Taking? Authorizing Provider   acetaminophen (TYLENOL) 325 MG tablet Take 650 mg by mouth as needed.    [provider]  betamethasone dipropionate 0.05 % cream Apply topically 2 (two) times daily. Do no use more that 7-10 days without calling. 08/12/21   Einar Pheasant, MD  busPIRone (BUSPAR) 5 MG tablet Take 1 tablet (5 mg total) by mouth 2 (two) times daily. 08/12/21   Einar Pheasant, MD  esomeprazole (NEXIUM) 40 MG capsule Take 1 capsule (40 mg total) by mouth daily. 06/17/21   Einar Pheasant, MD  hydrALAZINE (APRESOLINE) 25  MG tablet Take 1 tablet (25 mg total) by mouth 2 (two) times daily. 09/11/21   Einar Pheasant, MD  ibuprofen (ADVIL,MOTRIN) 200 MG tablet Take 200 mg by mouth every 6 (six) hours as needed.    [provider]  imipramine (TOFRANIL) 10 MG tablet Take 1 tablet (10 mg total) by mouth at bedtime. 03/06/21   Hollice Espy, MD  lovastatin (MEVACOR) 20 MG tablet Take 1 tablet (20 mg total) by mouth at bedtime. 06/19/21   Einar Pheasant, MD  mupirocin ointment (BACTROBAN) 2 % Apply 1 application topically daily. With dressing changes 06/13/21   Moye, [place], MD  nystatin cream (MYCOSTATIN) Apply 1 application topically 2 (two) times daily. 05/21/21   Einar Pheasant, MD  triamcinolone cream (KENALOG) 0.1 % Apply 1 application topically 2 (two) times daily as needed. 02/14/21   Moye, [place], MD  trospium (SANCTURA) 20 MG tablet Take 1 tablet (20 mg total) by mouth daily. 03/06/21   Hollice Espy, MD    Family History Family History  Problem Relation Age of Onset   Hodgkin's lymphoma Mother    Heart failure Father    Heart attack Father    Arthritis Sister        Three sisters w/ degeneratve disk disease   Headache Sister        Two sisters hx of headache   Breast cancer Neg Hx    Colon cancer Neg Hx    Bladder Cancer Neg Hx    Kidney cancer Neg Hx     Social History Social History   Tobacco Use   Smoking status: Former    Packs/day: 1.00    Years: 15.00    Pack years:  15.00    Types: Cigarettes   Smokeless tobacco: Never  Vaping Use   Vaping Use: Never used  Substance Use Topics   Alcohol use: No    Alcohol/week: 0.0 standard drinks   Drug use: No     Allergies   Augmentin [amoxicillin-pot clavulanate], Lexapro [escitalopram oxalate], Micardis [telmisartan], Myrbetriq [mirabegron], Niacin and related, Codeine sulfate, and Vesicare [solifenacin succinate]   Review of Systems Review of Systems  Constitutional:  Negative for chills and fever.  HENT:  Negative for ear pain and sore throat.   Respiratory:  Positive for cough and shortness of breath.   Cardiovascular:  Positive for chest pain. Negative for palpitations and leg swelling.  Gastrointestinal:  Negative for abdominal pain, diarrhea, nausea and vomiting.  Skin:  Negative for color change and rash.  Neurological:  Positive for weakness. Negative for dizziness, syncope, facial asymmetry, speech difficulty and numbness.  All other systems reviewed and are negative.   Physical Exam Triage Vital Signs ED Triage Vitals [10/13/21 1055]  Enc Vitals Group     BP (!) 183/79     Pulse Rate 77     Resp 18     Temp 97.8 F (36.6 C)     Temp src      SpO2 98 %     Weight      Height      Head Circumference      Peak Flow      Pain Score      Pain Loc      Pain Edu?      Excl. in [place]?    No data found.  Updated Vital Signs BP (!) 180/78    Pulse 77    Temp 97.8 F (36.6 C)    Resp 18  LMP 08/09/2012    SpO2 98%   Visual Acuity Right Eye Distance:   Left Eye Distance:   Bilateral Distance:    Right Eye Near:   Left Eye Near:    Bilateral Near:     Physical Exam Vitals and nursing note reviewed.  Constitutional:      General: She is not in acute distress.    Appearance: She is well-developed.  HENT:     Right Ear: Tympanic membrane normal.     Left Ear: Tympanic membrane normal.     Nose: Nose normal.     Mouth/Throat:     Mouth: Mucous membranes are moist.      Pharynx: Oropharynx is clear.  Cardiovascular:     Rate and Rhythm: Normal rate and regular rhythm.     Heart sounds: Normal heart sounds.  Pulmonary:     Effort: Pulmonary effort is normal. No respiratory distress.     Breath sounds: Normal breath sounds.  Abdominal:     General: Bowel sounds are normal.     Palpations: Abdomen is soft.     Tenderness: There is no abdominal tenderness. There is no guarding or rebound.  Musculoskeletal:     Cervical back: Neck supple.     Right lower leg: No edema.     Left lower leg: No edema.  Skin:    General: Skin is warm and dry.     Capillary Refill: Capillary refill takes less than 2 seconds.  Neurological:     General: No focal deficit present.     Mental Status: She is alert and oriented to person, place, and time.     Sensory: No sensory deficit.     Motor: No weakness.     Gait: Gait normal.  Psychiatric:        Mood and Affect: Mood normal.        Behavior: Behavior normal.     UC Treatments / Results  Labs (all labs ordered are listed, but only abnormal results are displayed) Labs Reviewed - No data to display  EKG   Radiology No results found.  Procedures Procedures (including critical care time)  Medications Ordered in UC Medications - No data to display  Initial Impression / Assessment and Plan / UC Course  I have reviewed the triage vital signs and the nursing notes.  Pertinent labs & imaging results that were available during my care of the patient were reviewed by me and considered in my medical decision making (see chart for details).  Chest pain, Shortness of breath.   EKG shows sinus rhythm, right bundle branch block, no ST elevation, rate 74, compared to previous from June 2022. "Heaviness" in chest and chest pressure have been present for several days.  She feels short of breath, worse with exertion.  She had an episode last week of weakness in her legs and walk unable to ambulate; this resolved.  Sending  patient to the ED for evaluation.  She and her sister decline EMS.  Her sister will drive her to the ED.      Final Clinical Impressions(s) / UC Diagnoses   Final diagnoses:  Chest pain, unspecified type  Shortness of breath     Discharge Instructions      Go to the emergency department for evaluation of chest pain and shortness of breath.           ED Prescriptions   None    PDMP not reviewed this encounter.   Hall Busing,  Fredderick Phenix, NP 10/13/21 1149

## 2021-10-13 NOTE — ED Triage Notes (Signed)
Pt is poor historian, but states that she "cannot go" meaning she is unable to complete her normal level of activities for several days due to general unwell feeling. Pt states that she has a "gurgling" in her LUQ and tenderness on palpation. States she is very tired and fatigued.

## 2021-10-13 NOTE — ED Notes (Signed)
Daughter to desk stating that pt was leaving

## 2021-10-13 NOTE — ED Triage Notes (Signed)
Pt via POV from home. Pt c/o chest tightness, SOB, and cough that has been going on the past 2 days and pt states it is getting worse. Pain radiates to her back. Denies cardiac hx. Denies NVD. Denies fevers. Pt is A&OX4 and NAD.

## 2021-10-13 NOTE — Discharge Instructions (Addendum)
Go to the emergency department for evaluation of chest pain and shortness of breath.

## 2021-10-21 ENCOUNTER — Telehealth: Payer: Self-pay | Admitting: Internal Medicine

## 2021-10-21 NOTE — Telephone Encounter (Signed)
Pt called in stating she wanted to talk to Larena Glassman about an ER visit she had on the 1/15

## 2021-10-21 NOTE — Telephone Encounter (Signed)
Reviewed records.  It appears she went to urgent care reporting sob, cough, etc.  Was informed to go to ER for evaluation.  Labs drawn at ER, but I do not see where she was actually seen.  Was she seen?  Also, need to clarify symptoms -for example cough, congestion, chest pain, etc.

## 2021-10-21 NOTE — Telephone Encounter (Signed)
Patient called and wanted to make sure I could see her records from where she went to [facility] and was sent to ED. Patient stated she has been having sob on exertion and fatigue. Has to stop in between tasks to rest. Breathing normal while at rest. Confirmed no acute symptoms at time of phone call. Has appt tomorrow with you. Pt was advised to return to ED if symptoms reoccur prior to appt. Pt agreed to do so.

## 2021-10-21 NOTE — Telephone Encounter (Signed)
Confirmed only symptoms are sob on exertion and fatigue

## 2021-10-22 ENCOUNTER — Other Ambulatory Visit: Payer: Self-pay

## 2021-10-22 ENCOUNTER — Ambulatory Visit: Payer: Medicare PPO | Admitting: Internal Medicine

## 2021-10-22 ENCOUNTER — Encounter: Payer: Self-pay | Admitting: Internal Medicine

## 2021-10-22 DIAGNOSIS — I700 Atherosclerosis of aorta: Secondary | ICD-10-CM

## 2021-10-22 DIAGNOSIS — R29898 Other symptoms and signs involving the musculoskeletal system: Secondary | ICD-10-CM | POA: Diagnosis not present

## 2021-10-22 DIAGNOSIS — E7800 Pure hypercholesterolemia, unspecified: Secondary | ICD-10-CM | POA: Diagnosis not present

## 2021-10-22 DIAGNOSIS — I10 Essential (primary) hypertension: Secondary | ICD-10-CM

## 2021-10-22 DIAGNOSIS — K219 Gastro-esophageal reflux disease without esophagitis: Secondary | ICD-10-CM

## 2021-10-22 DIAGNOSIS — F439 Reaction to severe stress, unspecified: Secondary | ICD-10-CM

## 2021-10-22 DIAGNOSIS — R0602 Shortness of breath: Secondary | ICD-10-CM | POA: Diagnosis not present

## 2021-10-22 DIAGNOSIS — R739 Hyperglycemia, unspecified: Secondary | ICD-10-CM

## 2021-10-22 MED ORDER — SERTRALINE HCL 50 MG PO TABS: ORAL_TABLET | ORAL | 1 refills | Status: DC

## 2021-10-22 NOTE — Progress Notes (Addendum)
[name], female   DOB: [birth year], 81 y.o.   MRN: [id number]   Subjective:    [name], female    DOB: February 28, 1941, 81 y.o.   MRN: [id number]  This visit occurred during the SARS-CoV-2 public health emergency.  Safety protocols were in place, including screening questions prior to the visit, additional usage of staff PPE, and extensive cleaning of exam room while observing appropriate contact time as indicated for disinfecting solutions.   Patient here for work in appt.   Chief Complaint  Patient presents with   Anxiety   shortness of breath on exertion   Fatigue        .   HPI She is accompanied by her daughter Sharee Pimple.  History obtained from both of them.  Was seen at urgent care 10/13/21 with concerns regarding sob.  On questioning her, she reports that one week before her urgent care visit, evaluated by dermatology - multiple places "zapped".  Reported a few days of sob and fatigue.  States woke and tried to get out of bed.  Legs "would not move".  On further questioning, it appears she was able to move her legs, but had a difficult time - stating was able to get out of bed, but it took a while for her to get moving.  Evaluated urgent care.  Recommended ER evaluation.  Had labs/EKG - did not stay at ER for evaluation.  Troponin negative x 2.  CXR ok.  Labs unrevealing.  Comes in today reporting persistent intermittent sob.  Feels at times - not able to get a good breath.  Tires easily and has to sit and rest.  She is feeling better than when evaluated at Urgent Care.  No chest pain currently.  No increased cough or congestion.  No increased acid reflux.  Bowels moving.  No abdominal pain.  Increased stress/anxiety.  Discussed.  On buspar.  Does not feel is working.  Feels needs something more to help level things out.    Past Medical History:  Diagnosis Date   Basal cell carcinoma 06/21/2020   left post shoulder sup, left mid chest, left lat thigh    Basal cell carcinoma (BCC) 06/13/2021   left dorsal foot   Basal cell carcinoma (BCC) 06/13/2021   right postauricular tx'd w/ EDC   BCC (basal cell carcinoma of skin) 03/30/2007   R inf med pretibial - BCC   BCC (basal cell carcinoma of skin) 10/12/2013   L nasal ala - BCC   BCC (basal cell carcinoma of skin) 03/10/2018   L mid dorsum med forearm - superficial BCC    BCC (basal cell carcinoma) 10/02/2021   left dorsal foot proximal, EDC 10/02/2021   BCC (basal cell carcinoma) 10/02/2021   left mid back, superficial   BCC (basal cell carcinoma) 10/02/2021   right pretibia   BCC (basal cell carcinoma) 10/02/2021   right mid back, superficial   Breast screening, unspecified 2013   Cancer ([place]) 1992   skin   Degenerative disk disease    GERD (gastroesophageal reflux disease)    History of basal cell carcinoma (BCC) 08/29/2020    left inferior knee lateral, , left inferior knee medial, right pretibia   History of SCC (squamous cell carcinoma) of skin 08/29/2020   right posterior calf, left lateral calf, and    Hypercholesterolemia 2008   Interstitial cystitis    followed by Dr Jacqlyn Larsen   Other sign and symptom in breast  2013   Left upper outer quadrant breast "soreness" Ultrasound exam of right breast in the 2 o'clock position with the breast distracted medially showed a 0.3-0.4 with 0.5 cm simple cyst. In the 1 o'clock position where pt reported tenderness US exam was negatiive.  Because of her history of intermittent nipple drainage, ultrasound was completed of the retroareolar area.   Personal history of tobacco use, presenting hazards to health    PONV (postoperative nausea and vomiting)    SCC (squamous cell carcinoma) 03/28/2008   R dorsum hand - SCC   SCC (squamous cell carcinoma) 05/09/2009   R lat lower leg - SCCIS   SCC (squamous cell carcinoma) 05/09/2009   L lat lower leg - SCCIS   SCC (squamous cell carcinoma) 04/07/2013   L lower leg - SCCIS   SCC (squamous cell  carcinoma) 04/27/2013   R forearm - SCC   SCC (squamous cell carcinoma) 04/28/2017   L lat knee - SCCIS   SCC (squamous cell carcinoma) 05/12/2018   L prox dorsum forearm - SCC   SCC (squamous cell carcinoma) 06/13/2021   left forearm tx'd w/ EDC   SCC (squamous cell carcinoma), leg, right 03/30/2007   R sup pretibial - SCCIS   SCC (squamous cell carcinoma);BCC 10/12/2013   Mid back - superficial BCC with SCCIS    Special screening for malignant neoplasms, colon    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   left dorsal forearm, left lat calf   Past Surgical History:  Procedure Laterality Date   ABDOMINAL HYSTERECTOMY  1992   APPENDECTOMY     CERVICAL DISCECTOMY     S/P C7-T1 discectomy with fusion   CHOLECYSTECTOMY  1990   COLONOSCOPY WITH PROPOFOL N/A 02/14/2016   Procedure: COLONOSCOPY WITH PROPOFOL;  Surgeon: Lucilla Lame, MD;  Location: [place];  Service: Endoscopy;  Laterality: N/A;  PT WOULD LIKE 10 ARRIVAL TIME OR LATER   DILATION AND CURETTAGE OF UTERUS     ESOPHAGOGASTRODUODENOSCOPY (EGD) WITH PROPOFOL N/A 02/14/2016   Procedure: ESOPHAGOGASTRODUODENOSCOPY (EGD) WITH PROPOFOL with dialtion;  Surgeon: Lucilla Lame, MD;  Location: [place];  Service: Endoscopy;  Laterality: N/A;   ESOPHAGOGASTRODUODENOSCOPY (EGD) WITH PROPOFOL N/A 04/13/2019   Procedure: ESOPHAGOGASTRODUODENOSCOPY (EGD) WITH PROPOFOL;  Surgeon: Toledo, Benay Pike, MD;  Location: [facility];  Service: Gastroenterology;  Laterality: N/A;   MELANOMA EXCISION     removed from Left calf 1994   Family History  Problem Relation Age of Onset   Hodgkin's lymphoma Mother    Heart failure Father    Heart attack Father    Arthritis Sister        Three sisters w/ degeneratve disk disease   Headache Sister        Two sisters hx of headache   Breast cancer Neg Hx    Colon cancer Neg Hx    Bladder Cancer Neg Hx    Kidney cancer Neg Hx    Social History   Socioeconomic History   Marital  status: Widowed    Spouse name: Not on file   Number of children: 2   Years of education: Not on file   Highest education level: Not on file  Occupational History   Not on file  Tobacco Use   Smoking status: Former    Packs/day: 1.00    Years: 15.00    Pack years: 15.00    Types: Cigarettes   Smokeless tobacco: Never  Vaping Use   Vaping Use: Never used  Substance and Sexual Activity   Alcohol use: No    Alcohol/week: 0.0 standard drinks   Drug use: No   Sexual activity: Not on file  Other Topics Concern   Not on file  Social History Narrative   Married and has 2 children, daughters.   Social Determinants of Health   Financial Resource Strain: Not on file  Food Insecurity: Not on file  Transportation Needs: Not on file  Physical Activity: Not on file  Stress: Not on file  Social Connections: Not on file     Review of Systems  Constitutional:  Positive for fatigue. Negative for appetite change and unexpected weight change.  HENT:  Negative for congestion and sinus pressure.   Respiratory:  Positive for shortness of breath. Negative for cough and wheezing.        Previously described chest tightness.   Cardiovascular:  Negative for chest pain, palpitations and leg swelling.  Gastrointestinal:  Negative for abdominal pain, diarrhea, nausea and vomiting.  Genitourinary:  Negative for difficulty urinating and dysuria.  Musculoskeletal:  Negative for joint swelling and myalgias.  Skin:  Negative for color change and rash.  Neurological:  Negative for dizziness, light-headedness and headaches.  Psychiatric/Behavioral:  Negative for dysphoric mood.        Increased anxiety as outlined.       Objective:     BP (!) 148/78    Pulse 86    Temp 97.8 F (36.6 C)    Resp 16    Ht $R'5\' 4"'Lx$  (1.626 m)    Wt 149 lb 12.8 oz (67.9 kg)    LMP 08/09/2012    SpO2 99%    BMI 25.71 kg/m  Wt Readings from Last 3 Encounters:  10/22/21 149 lb 12.8 oz (67.9 kg)  08/12/21 150 lb 9.6 oz  (68.3 kg)  05/21/21 152 lb 3.2 oz (69 kg)    Physical Exam Vitals reviewed.  Constitutional:      General: She is not in acute distress.    Appearance: Normal appearance.  HENT:     Head: Normocephalic and atraumatic.     Right Ear: External ear normal.     Left Ear: External ear normal.  Eyes:     General: No scleral icterus.       Right eye: No discharge.        Left eye: No discharge.     Conjunctiva/sclera: Conjunctivae normal.  Neck:     Thyroid: No thyromegaly.  Cardiovascular:     Rate and Rhythm: Normal rate and regular rhythm.  Pulmonary:     Effort: No respiratory distress.     Breath sounds: Normal breath sounds. No wheezing.  Abdominal:     General: Bowel sounds are normal.     Palpations: Abdomen is soft.     Tenderness: There is no abdominal tenderness.  Musculoskeletal:        General: No swelling or tenderness.     Cervical back: Neck supple. No tenderness.  Lymphadenopathy:     Cervical: No cervical adenopathy.  Skin:    Findings: No erythema or rash.  Neurological:     Mental Status: She is alert.     Comments: Motor strength - equal bilateral lower extremities (5/5). Gait wnl.    Psychiatric:        Mood and Affect: Mood normal.        Behavior: Behavior normal.     Outpatient Encounter Medications as of 10/22/2021  Medication Sig   aspirin  81 MG EC tablet Take 1 tablet (81 mg total) by mouth daily. Swallow whole.   sertraline (ZOLOFT) 50 MG tablet 1/2 tablet q day x 7 days and then one per day.   acetaminophen (TYLENOL) 325 MG tablet Take 650 mg by mouth as needed.   betamethasone dipropionate 0.05 % cream Apply topically 2 (two) times daily. Do no use more that 7-10 days without calling.   busPIRone (BUSPAR) 5 MG tablet Take 1 tablet (5 mg total) by mouth 2 (two) times daily.   esomeprazole (NEXIUM) 40 MG capsule Take 1 capsule (40 mg total) by mouth daily.   hydrALAZINE (APRESOLINE) 25 MG tablet Take 1 tablet (25 mg total) by mouth 2 (two)  times daily.   ibuprofen (ADVIL,MOTRIN) 200 MG tablet Take 200 mg by mouth every 6 (six) hours as needed.   imipramine (TOFRANIL) 10 MG tablet Take 1 tablet (10 mg total) by mouth at bedtime.   lovastatin (MEVACOR) 20 MG tablet Take 1 tablet (20 mg total) by mouth at bedtime.   mupirocin ointment (BACTROBAN) 2 % Apply 1 application topically daily. With dressing changes   nystatin cream (MYCOSTATIN) Apply 1 application topically 2 (two) times daily.   triamcinolone cream (KENALOG) 0.1 % Apply 1 application topically 2 (two) times daily as needed.   trospium (SANCTURA) 20 MG tablet Take 1 tablet (20 mg total) by mouth daily.   No facility-administered encounter medications on file as of 10/22/2021.     Lab Results  Component Value Date   WBC 7.4 10/13/2021   HGB 13.0 10/13/2021   HCT 40.0 10/13/2021   PLT 257 10/13/2021   GLUCOSE 112 (H) 10/13/2021   CHOL 174 10/09/2021   TRIG 79.0 10/09/2021   HDL 68.50 10/09/2021   LDLCALC 90 10/09/2021   ALT 12 10/09/2021   AST 14 10/09/2021   NA 137 10/13/2021   K 4.3 10/13/2021   CL 104 10/13/2021   CREATININE 0.94 10/13/2021   BUN 9 10/13/2021   CO2 25 10/13/2021   TSH 2.10 10/09/2021   HGBA1C 5.8 10/09/2021    DG Chest 2 View  Result Date: 10/13/2021 CLINICAL DATA:  Chest pain, shortness of breath EXAM: CHEST - 2 VIEW COMPARISON:  09/03/2019 FINDINGS: The heart size and mediastinal contours are within normal limits. Both lungs are clear. There is previous surgical fusion in the lower cervical spine. IMPRESSION: No active cardiopulmonary disease. Electronically Signed   By: Elmer Picker M.D.   On: 10/13/2021 12:33       Assessment & Plan:   Problem List Items Addressed This Visit     Aortic atherosclerosis ([place])    Continue mevacor.       Relevant Medications   aspirin 81 MG EC tablet   Essential hypertension    Intolerant to amlodipine.  Blood pressure elevated.  Take hydralazine as ordered.  Follow pressures.  Follow  metabolic panel. Treat anxiety.        Relevant Medications   aspirin 81 MG EC tablet   GERD (gastroesophageal reflux disease)    No upper symptoms reported.  Continue nexium.       Hypercholesterolemia    Continue mevacor.  Low cholesterol diet and exercise.  Follow lipid panel and liver function tests.       Relevant Medications   aspirin 81 MG EC tablet   Hyperglycemia    Low carb diet and exercise. Follow met b and a1c.       Leg weakness    Described  bilateral "weakness" as outlined.  Unclear etiology.  It does appear she could actually move her legs, getting out of bed, etc.  Describes difficult to ambulate until up and moving more.  No focal deficits noted on exam.  Will start ecasa $RemoveBef'81mg'iPHKTdyxqc$  q day.  Pursue cardiology w/up as outlined.        Relevant Orders   Ambulatory referral to Cardiology   SOB (shortness of breath)    Describes recent problems with sob, feeling as if she cannot get a good breath. Also describes, she has to stop and rest.  Increased fatigue.  Recent cxr ok.  Recent troponin negative x 2.  Labs unrevealing.  Was not evaluated in ER - left before being seen.  Given symptoms, f/u EKG - SR with RBBB and non specific ST/T changes.  Discussed further cardiac evaluation.  Refer - for further evaluation and w/up.  Discussed if symptoms worsened or changed, the need to be reevaluated.        Relevant Orders   EKG 12-Lead (Completed)   Ambulatory referral to Cardiology   Stress    Increased stress and anxiety as outlined.  On buspar.  Does not feel is helping.  In reviewing, had intolerance to lexapro.  Has taken zoloft and is not aware of any problems taking.  Will restart zoloft - as directed.  Follow closely.         I spent 45 minutes with the patient and more than 50% of the time was spent in consultation regarding the above.  Time spent discussing current symptoms and concerns.  Time also spent discussing further treatment, evaluation and w/up.   Einar Pheasant, MD

## 2021-10-22 NOTE — Patient Instructions (Signed)
Zoloft - take 1/2 tablet per day for 7 days and then start one per day

## 2021-10-23 ENCOUNTER — Encounter: Payer: Self-pay | Admitting: Internal Medicine

## 2021-10-23 DIAGNOSIS — R29898 Other symptoms and signs involving the musculoskeletal system: Secondary | ICD-10-CM | POA: Insufficient documentation

## 2021-10-23 MED ORDER — ASPIRIN 81 MG PO TBEC: 81.0000 mg | DELAYED_RELEASE_TABLET | Freq: Every day | ORAL | 12 refills | Status: DC

## 2021-10-23 NOTE — Assessment & Plan Note (Signed)
Described bilateral "weakness" as outlined.  Unclear etiology.  It does appear she could actually move her legs, getting out of bed, etc.  Describes difficult to ambulate until up and moving more.  No focal deficits noted on exam.  Will start ecasa 81mg  q day.  Pursue cardiology w/up as outlined.

## 2021-10-23 NOTE — Assessment & Plan Note (Signed)
Describes recent problems with sob, feeling as if she cannot get a good breath. Also describes, she has to stop and rest.  Increased fatigue.  Recent cxr ok.  Recent troponin negative x 2.  Labs unrevealing.  Was not evaluated in ER - left before being seen.  Given symptoms, f/u EKG - SR with RBBB and non specific ST/T changes.  Discussed further cardiac evaluation.  Refer - for further evaluation and w/up.  Discussed if symptoms worsened or changed, the need to be reevaluated.

## 2021-10-23 NOTE — Assessment & Plan Note (Signed)
No upper symptoms reported.  Continue nexium.  

## 2021-10-23 NOTE — Assessment & Plan Note (Signed)
Low carb diet and exercise.  Follow met b and a1c.   

## 2021-10-23 NOTE — Assessment & Plan Note (Signed)
Continue mevacor °

## 2021-10-23 NOTE — Assessment & Plan Note (Signed)
Continue mevacor.  Low cholesterol diet and exercise.  Follow lipid panel and liver function tests.  

## 2021-10-23 NOTE — Assessment & Plan Note (Signed)
Increased stress and anxiety as outlined.  On buspar.  Does not feel is helping.  In reviewing, had intolerance to lexapro.  Has taken zoloft and is not aware of any problems taking.  Will restart zoloft - as directed.  Follow closely.

## 2021-10-23 NOTE — Addendum Note (Signed)
Addended by: Alisa Graff on: 10/23/2021 04:33 AM   Modules accepted: Orders

## 2021-10-23 NOTE — Assessment & Plan Note (Signed)
Intolerant to amlodipine.  Blood pressure elevated.  Take hydralazine as ordered.  Follow pressures.  Follow metabolic panel. Treat anxiety.

## 2021-10-25 NOTE — Progress Notes (Signed)
Cardiology Office Note:    Date:  10/29/2021   ID:  Rachael Austin, DOB Jul 15, 1941, MRN [id number]  PCP:  Einar Pheasant, MD  [facility] Cardiologist: Dr. Arvid Right [facility] Electrophysiologist:  None   Referring MD: Einar Pheasant, MD   Chief Complaint: SOB/chest heaviness  History of Present Illness:    Rachael Austin is a 81 y.o. female with a hx of HTN, HLD, syncope, aortic atherosclerosis, chest pain who presents for SOB.   Last seen by Dr. Rockey Situ 03/18/21 for syncope, chest pain, aortic atherosclerosis. Denied exertional chest pain. Reported episodes of near syncope. Ekg showed NSR with RBBB. Zio monitor was ordered. Heart monitor showed NSR, rare tachycardia longest 20 seconds. Recommended she call the office for any chest pain.   ER visit 10/13/21 for chest pain or SOB. She went to the urgent care first and was sent to the ER. In the ER EKG showed NSR, RBBB, no ST changes. Hs trop negative x 2. BP was high 183/79. Pt/family saw the results online and noted they were normal, so they left before being evaluated because the wait was so long.   Today, the patient reports chest heaviness, mainly when she gets up to do something. Has to stop and rest before she can get up again. Symptoms started a 3-4 months ago. Has not been getting worse. This is new since the last visit. Also feels more tired easily. No chest heaviness when she sits or lays down. She has SOB with exertion. No LLE, orthopnea, pnd. Occasional palpitations. No recent fever or chills. Eating and drinking normally. EKG is unchanged, some nonspecific T wave changes. No active chest pain.    Past Medical History:  Diagnosis Date   Basal cell carcinoma 06/21/2020   left post shoulder sup, left mid chest, left lat thigh   Basal cell carcinoma (BCC) 06/13/2021   left dorsal foot   Basal cell carcinoma (BCC) 06/13/2021   right postauricular tx'd w/ EDC   BCC (basal cell carcinoma of skin) 03/30/2007   R inf  med pretibial - BCC   BCC (basal cell carcinoma of skin) 10/12/2013   L nasal ala - BCC   BCC (basal cell carcinoma of skin) 03/10/2018   L mid dorsum med forearm - superficial BCC    BCC (basal cell carcinoma) 10/02/2021   left dorsal foot proximal, EDC 10/02/2021   BCC (basal cell carcinoma) 10/02/2021   left mid back, superficial   BCC (basal cell carcinoma) 10/02/2021   right pretibia   BCC (basal cell carcinoma) 10/02/2021   right mid back, superficial   Breast screening, unspecified 2013   Cancer ([place]) 1992   skin   Degenerative disk disease    GERD (gastroesophageal reflux disease)    History of basal cell carcinoma (BCC) 08/29/2020    left inferior knee lateral, , left inferior knee medial, right pretibia   History of SCC (squamous cell carcinoma) of skin 08/29/2020   right posterior calf, left lateral calf, and    Hypercholesterolemia 2008   Interstitial cystitis    followed by Dr Jacqlyn Larsen   Other sign and symptom in breast 2013   Left upper outer quadrant breast "soreness" Ultrasound exam of right breast in the 2 o'clock position with the breast distracted medially showed a 0.3-0.4 with 0.5 cm simple cyst. In the 1 o'clock position where pt reported tenderness US exam was negatiive.  Because of her history of intermittent nipple drainage, ultrasound was completed of the retroareolar area.  Personal history of tobacco use, presenting hazards to health    PONV (postoperative nausea and vomiting)    SCC (squamous cell carcinoma) 03/28/2008   R dorsum hand - SCC   SCC (squamous cell carcinoma) 05/09/2009   R lat lower leg - SCCIS   SCC (squamous cell carcinoma) 05/09/2009   L lat lower leg - SCCIS   SCC (squamous cell carcinoma) 04/07/2013   L lower leg - SCCIS   SCC (squamous cell carcinoma) 04/27/2013   R forearm - SCC   SCC (squamous cell carcinoma) 04/28/2017   L lat knee - SCCIS   SCC (squamous cell carcinoma) 05/12/2018   L prox dorsum forearm - SCC   SCC (squamous  cell carcinoma) 06/13/2021   left forearm tx'd w/ EDC   SCC (squamous cell carcinoma), leg, right 03/30/2007   R sup pretibial - SCCIS   SCC (squamous cell carcinoma);BCC 10/12/2013   Mid back - superficial BCC with SCCIS    Special screening for malignant neoplasms, colon    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   left dorsal forearm, left lat calf    Past Surgical History:  Procedure Laterality Date   ABDOMINAL HYSTERECTOMY  1992   APPENDECTOMY     CERVICAL DISCECTOMY     S/P C7-T1 discectomy with fusion   CHOLECYSTECTOMY  1990   COLONOSCOPY WITH PROPOFOL N/A 02/14/2016   Procedure: COLONOSCOPY WITH PROPOFOL;  Surgeon: Lucilla Lame, MD;  Location: [facility];  Service: Endoscopy;  Laterality: N/A;  PT WOULD LIKE 10 ARRIVAL TIME OR LATER   DILATION AND CURETTAGE OF UTERUS     ESOPHAGOGASTRODUODENOSCOPY (EGD) WITH PROPOFOL N/A 02/14/2016   Procedure: ESOPHAGOGASTRODUODENOSCOPY (EGD) WITH PROPOFOL with dialtion;  Surgeon: Lucilla Lame, MD;  Location: [facility];  Service: Endoscopy;  Laterality: N/A;   ESOPHAGOGASTRODUODENOSCOPY (EGD) WITH PROPOFOL N/A 04/13/2019   Procedure: ESOPHAGOGASTRODUODENOSCOPY (EGD) WITH PROPOFOL;  Surgeon: Toledo, Benay Pike, MD;  Location: [facility];  Service: Gastroenterology;  Laterality: N/A;   MELANOMA EXCISION     removed from Left calf 1994    Current Medications: Current Meds  Medication Sig   acetaminophen (TYLENOL) 325 MG tablet Take 650 mg by mouth as needed.   aspirin 81 MG EC tablet Take 1 tablet (81 mg total) by mouth daily. Swallow whole.   betamethasone dipropionate 0.05 % cream Apply topically 2 (two) times daily. Do no use more that 7-10 days without calling.   busPIRone (BUSPAR) 5 MG tablet Take 1 tablet (5 mg total) by mouth 2 (two) times daily.   esomeprazole (NEXIUM) 40 MG capsule Take 1 capsule (40 mg total) by mouth daily.   hydrALAZINE (APRESOLINE) 25 MG tablet Take 1 tablet (25 mg total) by mouth 2 (two)  times daily.   ibuprofen (ADVIL,MOTRIN) 200 MG tablet Take 200 mg by mouth every 6 (six) hours as needed.   imipramine (TOFRANIL) 10 MG tablet Take 1 tablet (10 mg total) by mouth at bedtime.   lovastatin (MEVACOR) 20 MG tablet Take 1 tablet (20 mg total) by mouth at bedtime.   mupirocin ointment (BACTROBAN) 2 % Apply 1 application topically daily. With dressing changes   nystatin cream (MYCOSTATIN) Apply 1 application topically 2 (two) times daily.   sertraline (ZOLOFT) 50 MG tablet 1/2 tablet q day x 7 days and then one per day.   triamcinolone cream (KENALOG) 0.1 % Apply 1 application topically 2 (two) times daily as needed.   trospium (SANCTURA) 20 MG tablet Take 1 tablet (20  mg total) by mouth daily.     Allergies:   Augmentin [amoxicillin-pot clavulanate], Lexapro [escitalopram oxalate], Micardis [telmisartan], Myrbetriq [mirabegron], Niacin and related, Codeine sulfate, and Vesicare [solifenacin succinate]   Social History   Socioeconomic History   Marital status: Widowed    Spouse name: Not on file   Number of children: 2   Years of education: Not on file   Highest education level: Not on file  Occupational History   Not on file  Tobacco Use   Smoking status: Former    Packs/day: 1.00    Years: 15.00    Pack years: 15.00    Types: Cigarettes   Smokeless tobacco: Never  Vaping Use   Vaping Use: Never used  Substance and Sexual Activity   Alcohol use: No    Alcohol/week: 0.0 standard drinks   Drug use: No   Sexual activity: Not on file  Other Topics Concern   Not on file  Social History Narrative   Married and has 2 children, daughters.   Social Determinants of Health   Financial Resource Strain: Not on file  Food Insecurity: Not on file  Transportation Needs: Not on file  Physical Activity: Not on file  Stress: Not on file  Social Connections: Not on file     Family History: The patient's family history includes Arthritis in her sister; Headache in her  sister; Heart attack in her father; Heart failure in her father; Hodgkin's lymphoma in her mother. There is no history of Breast cancer, Colon cancer, Bladder Cancer, or Kidney cancer.  ROS:   Please see the history of present illness.     All other systems reviewed and are negative.  EKGs/Labs/Other Studies Reviewed:    The following studies were reviewed today:  Heart monitor 03/2021 Event Monitor Patch Wear Time:  13 days and 22 hours (2022-06-20T11:32:33-0400 to 2022-07-04T09:47:25-0400)   Patient had a min HR of 67 bpm, max HR of 235 bpm, and avg HR of 94 bpm. Predominant underlying rhythm was Sinus Rhythm.    23 Supraventricular Tachycardia/atrial tachycardia runs occurred, the run with the fastest interval lasting 16 beats with a max rate of 235 bpm, the  longest lasting 21.7 secs with an avg rate of 124 bpm.    Isolated SVEs were rare (<1.0%), SVE Couplets were rare (<1.0%), and SVE Triplets were rare (<1.0%).  Isolated VEs were rare (<1.0%, 540), VE Couplets were rare (<1.0%, 6), and VE Triplets were rare (<1.0%, 1).    No patient triggered events recorded   Signed, Esmond Plants, MD, Ph.D [facility]  EKG:  EKG is  ordered today.  The ekg ordered today demonstrates NSR, RBBB, 84bpm, possible LAE, nonspecific /ST/T wave changes, LAD,   Recent Labs: 10/09/2021: ALT 12; TSH 2.10 10/13/2021: BUN 9; Creatinine, Ser 0.94; Hemoglobin 13.0; Platelets 257; Potassium 4.3; Sodium 137  Recent Lipid Panel    Component Value Date/Time   CHOL 174 10/09/2021 0855   TRIG 79.0 10/09/2021 0855   HDL 68.50 10/09/2021 0855   CHOLHDL 3 10/09/2021 0855   VLDL 15.8 10/09/2021 0855   LDLCALC 90 10/09/2021 0855     Physical Exam:    VS:  BP 130/60 (BP Location: Left Arm, Patient Position: Sitting, Cuff Size: Normal)    Pulse 84    Ht 5\' 4"  (1.626 m)    Wt 147 lb 8 oz (66.9 kg)    LMP 08/09/2012    SpO2 99%    BMI 25.32 kg/m  Wt Readings from Last 3 Encounters:  10/29/21 147 lb 8  oz (66.9 kg)  10/22/21 149 lb 12.8 oz (67.9 kg)  08/12/21 150 lb 9.6 oz (68.3 kg)     GEN:  Well nourished, well developed in no acute distress HEENT: Normal NECK: No JVD; +carotid bruits LYMPHATICS: No lymphadenopathy CARDIAC: RRR, + murmur, no rubs, gallops RESPIRATORY:  Clear to auscultation without rales, wheezing or rhonchi  ABDOMEN: Soft, non-tender, non-distended MUSCULOSKELETAL:  No edema; No deformity  SKIN: Warm and dry NEUROLOGIC:  Alert and oriented x 3 PSYCHIATRIC:  Normal affect   ASSESSMENT:    1. Chest pain of uncertain etiology   2. Palpitations   3. SOB (shortness of breath)   4. Bilateral carotid bruits   5. Murmur   6. Essential hypertension   7. Aortic atherosclerosis (HCC)    PLAN:    In order of problems listed above:  Chest heavenliness/SOB Recent ER visit for 3-4 months of chest heaviness and SOB on exertion. HS trop neg x 2. EKG unremarkable. Other labs unremarkable. Pt left the ER without seeing MD. No active chest pain pain today, only on exertion. No LLE, orthopnea, pnd, fever, chills. Discussed stress test, however after conversation we will start with an echo. We will see back in a month to discuss further ischemic testing. Continue Aspirin, statin.   Aortic atherosclerosis Seen on imaging 02/2019. Continue Aspirin and statin.   HTN BP high in the ER. BP at home 120-140s. BP today good. Continue hydralazine 25mg  BID.   Murmur Check an echo as above.  Carotid bruits b/l Will get carotid US.    Disposition: Follow up in 1 month(s) with MD/APP    Signed,  Ninfa Meeker, PA-C  10/29/2021 12:38 PM    [facility]

## 2021-10-29 ENCOUNTER — Ambulatory Visit: Payer: Medicare PPO | Admitting: Medical

## 2021-10-29 ENCOUNTER — Other Ambulatory Visit: Payer: Self-pay

## 2021-10-29 ENCOUNTER — Encounter: Payer: Self-pay | Admitting: Medical

## 2021-10-29 VITALS — BP 130/60 | HR 84 | Ht 64.0 in | Wt 147.5 lb

## 2021-10-29 DIAGNOSIS — R011 Cardiac murmur, unspecified: Secondary | ICD-10-CM | POA: Diagnosis not present

## 2021-10-29 DIAGNOSIS — I700 Atherosclerosis of aorta: Secondary | ICD-10-CM | POA: Diagnosis not present

## 2021-10-29 DIAGNOSIS — R002 Palpitations: Secondary | ICD-10-CM

## 2021-10-29 DIAGNOSIS — R0602 Shortness of breath: Secondary | ICD-10-CM

## 2021-10-29 DIAGNOSIS — I10 Essential (primary) hypertension: Secondary | ICD-10-CM | POA: Diagnosis not present

## 2021-10-29 DIAGNOSIS — R0989 Other specified symptoms and signs involving the circulatory and respiratory systems: Secondary | ICD-10-CM | POA: Diagnosis not present

## 2021-10-29 DIAGNOSIS — R079 Chest pain, unspecified: Secondary | ICD-10-CM

## 2021-10-29 NOTE — Patient Instructions (Signed)
Medication Instructions:   Your physician recommends that you continue on your current medications as directed. Please refer to the Current Medication list given to you today.   *If you need a refill on your cardiac medications before your next appointment, please call your pharmacy*   Lab Work: None ordered  If you have labs (blood work) drawn today and your tests are completely normal, you will receive your results only by: [place] (if you have MyChart) OR A paper copy in the mail If you have any lab test that is abnormal or we need to change your treatment, we will call you to review the results.   Testing/Procedures:  Echocardiogram - Your physician has requested that you have an echocardiogram. Echocardiography is a painless test that uses sound waves to create images of your heart. It provides your doctor with information about the size and shape of your heart and how well your hearts chambers and valves are working. This procedure takes approximately one hour. There are no restrictions for this procedure.   Follow-Up: At [facility], you and your health needs are our priority.  As part of our continuing mission to provide you with exceptional heart care, we have created designated Provider Care Teams.  These Care Teams include your primary Cardiologist (physician) and Advanced Practice Providers (APPs -  Physician Assistants and Nurse Practitioners) who all work together to provide you with the care you need, when you need it.  We recommend signing up for the patient portal called "MyChart".  Sign up information is provided on this After Visit Summary.  MyChart is used to connect with patients for Virtual Visits (Telemedicine).  Patients are able to view lab/test results, encounter notes, upcoming appointments, etc.  Non-urgent messages can be sent to your provider as well.   To learn more about what you can do with MyChart, go to [email or web address].    Your next  appointment:   1 month(s)  The format for your next appointment:   In Person  Provider:   You may see Ida Rogue, MD or one of the following Advanced Practice Providers on your designated Care Team:   Murray Hodgkins, NP Christell Faith, PA-C Cadence Kathlen Mody, PA-C1}    Other Instructions N/A

## 2021-11-07 ENCOUNTER — Other Ambulatory Visit: Payer: Self-pay

## 2021-11-07 ENCOUNTER — Encounter: Payer: Self-pay | Admitting: Dermatology

## 2021-11-07 ENCOUNTER — Ambulatory Visit: Payer: Medicare PPO | Admitting: Dermatology

## 2021-11-07 DIAGNOSIS — L570 Actinic keratosis: Secondary | ICD-10-CM | POA: Diagnosis not present

## 2021-11-07 DIAGNOSIS — C44519 Basal cell carcinoma of skin of other part of trunk: Secondary | ICD-10-CM | POA: Diagnosis not present

## 2021-11-07 DIAGNOSIS — D485 Neoplasm of uncertain behavior of skin: Secondary | ICD-10-CM

## 2021-11-07 NOTE — Patient Instructions (Addendum)
Wound Care Instructions ? ?Cleanse wound gently with soap and water once a day then pat dry with clean gauze. Apply a thing coat of Petrolatum (petroleum jelly, "Vaseline") over the wound (unless you have an allergy to this). We recommend that you use a new, sterile tube of Vaseline. Do not pick or remove scabs. Do not remove the yellow or white "healing tissue" from the base of the wound. ? ?Cover the wound with fresh, clean, nonstick gauze and secure with paper tape. You may use Band-Aids in place of gauze and tape if the would is small enough, but would recommend trimming much of the tape off as there is often too much. Sometimes Band-Aids can irritate the skin. ? ?You should call the office for your biopsy report after 1 week if you have not already been contacted. ? ?If you experience any problems, such as abnormal amounts of bleeding, swelling, significant bruising, significant pain, or evidence of infection, please call the office immediately. ? ?FOR ADULT SURGERY PATIENTS: If you need something for pain relief you may take 1 extra strength Tylenol (acetaminophen) AND 2 Ibuprofen (200mg each) together every 4 hours as needed for pain. (do not take these if you are allergic to them or if you have a reason you should not take them.) Typically, you may only need pain medication for 1 to 3 days.  ? ?Cryotherapy Aftercare ? ?Wash gently with soap and water everyday.   ?Apply Vaseline and Band-Aid daily until healed.  ? ?Prior to procedure, discussed risks of blister formation, small wound, skin dyspigmentation, or rare scar following cryotherapy. Recommend Vaseline ointment to treated areas while healing.  ? ?If You Need Anything After Your Visit ? ?If you have any questions or concerns for your doctor, please call our main line at [phone] and press option 4 to reach your doctor's medical assistant. If no one answers, please leave a voicemail as directed and we will return your call as soon as possible.  Messages left after 4 pm will be answered the following business day.  ? ?You may also send us a message via MyChart. We typically respond to MyChart messages within 1-2 business days. ? ?For prescription refills, please ask your pharmacy to contact our office. Our fax number is [phone]. ? ?If you have an urgent issue when the clinic is closed that cannot wait until the next business day, you can page your doctor at the number below.   ? ?Please note that while we do our best to be available for urgent issues outside of office hours, we are not available 24/7.  ? ?If you have an urgent issue and are unable to reach us, you may choose to seek medical care at your doctor's office, retail clinic, urgent care center, or emergency room. ? ?If you have a medical emergency, please immediately call 911 or go to the emergency department. ? ?Pager Numbers ? ?- Dr. Kowalski: [phone] ? ?- Dr. Moye: [phone] ? ?- Dr. Stewart: [phone] ? ?In the event of inclement weather, please call our main line at [phone] for an update on the status of any delays or closures. ? ?Dermatology Medication Tips: ?Please keep the boxes that topical medications come in in order to help keep track of the instructions about where and how to use these. Pharmacies typically print the medication instructions only on the boxes and not directly on the medication tubes.  ? ?If your medication is too expensive, please contact our office at [phone] option 4   or send us a message through MyChart.  ? ?We are unable to tell what your co-pay for medications will be in advance as this is different depending on your insurance coverage. However, we may be able to find a substitute medication at lower cost or fill out paperwork to get insurance to cover a needed medication.  ? ?If a prior authorization is required to get your medication covered by your insurance company, please allow us 1-2 business days to complete this process. ? ?Drug  prices often vary depending on where the prescription is filled and some pharmacies may offer cheaper prices. ? ?The website www.goodrx.com contains coupons for medications through different pharmacies. The prices here do not account for what the cost may be with help from insurance (it may be cheaper with your insurance), but the website can give you the price if you did not use any insurance.  ?- You can print the associated coupon and take it with your prescription to the pharmacy.  ?- You may also stop by our office during regular business hours and pick up a GoodRx coupon card.  ?- If you need your prescription sent electronically to a different pharmacy, notify our office through [facility] MyChart or by phone at [phone] option 4. ? ? ? ? ?Si Usted Necesita Algo Despu?s de Su Visita ? ?Tambi?n puede enviarnos un mensaje a trav?s de MyChart. Por lo general respondemos a los mensajes de MyChart en el transcurso de 1 a 2 d?as h?biles. ? ?Para renovar recetas, por favor pida a su farmacia que se ponga en contacto con nuestra oficina. Nuestro n?mero de fax es el [phone]. ? ?Si tiene un asunto urgente cuando la cl?nica est? cerrada y que no puede esperar hasta el siguiente d?a h?bil, puede llamar/localizar a su doctor(a) al n?mero que aparece a continuaci?n.  ? ?Por favor, tenga en cuenta que aunque hacemos todo lo posible para estar disponibles para asuntos urgentes fuera del horario de oficina, no estamos disponibles las 24 horas del d?a, los 7 d?as de la semana.  ? ?Si tiene un problema urgente y no puede comunicarse con nosotros, puede optar por buscar atenci?n m?dica  en el consultorio de su doctor(a), en una cl?nica privada, en un centro de atenci?n urgente o en una sala de emergencias. ? ?Si tiene una emergencia m?dica, por favor llame inmediatamente al 911 o vaya a la sala de emergencias. ? ?N?meros de b?per ? ?- Dr. Kowalski: [phone] ? ?- Dra. Moye: [phone] ? ?- Dra. Stewart:  [phone] ? ?En caso de inclemencias del tiempo, por favor llame a nuestra l?nea principal al [phone] para una actualizaci?n sobre el estado de cualquier retraso o cierre. ? ?Consejos para la medicaci?n en dermatolog?a: ?Por favor, guarde las cajas en las que vienen los medicamentos de uso t?pico para ayudarle a seguir las instrucciones sobre d?nde y c?mo usarlos. Las farmacias generalmente imprimen las instrucciones del medicamento s?lo en las cajas y no directamente en los tubos del medicamento.  ? ?Si su medicamento es muy caro, por favor, p?ngase en contacto con nuestra oficina llamando al [phone] y presione la opci?n 4 o env?enos un mensaje a trav?s de MyChart.  ? ?No podemos decirle cu?l ser? su copago por los medicamentos por adelantado ya que esto es diferente dependiendo de la cobertura de su seguro. Sin embargo, es posible que podamos encontrar un medicamento sustituto a menor costo o llenar un formulario para que el seguro cubra el medicamento que se considera necesario.  ? ?  Si se requiere una autorizaci?n previa para que su compa??a de seguros cubra su medicamento, por favor perm?tanos de 1 a 2 d?as h?biles para completar este proceso. ? ?Los precios de los medicamentos var?an con frecuencia dependiendo del lugar de d?nde se surte la receta y alguna farmacias pueden ofrecer precios m?s baratos. ? ?El sitio web www.goodrx.com tiene cupones para medicamentos de diferentes farmacias. Los precios aqu? no tienen en cuenta lo que podr?a costar con la ayuda del seguro (puede ser m?s barato con su seguro), pero el sitio web puede darle el precio si no utiliz? ning?n seguro.  ?- Puede imprimir el cup?n correspondiente y llevarlo con su receta a la farmacia.  ?- Tambi?n puede pasar por nuestra oficina durante el horario de atenci?n regular y recoger una tarjeta de cupones de GoodRx.  ?- Si necesita que su receta se env?e electr?nicamente a una farmacia diferente, informe a nuestra oficina a trav?s de  MyChart de [place] o por tel?fono llamando al [phone] y presione la opci?n 4.  ?

## 2021-11-07 NOTE — Progress Notes (Signed)
Follow-Up Visit   Subjective  Rachael Austin is a 81 y.o. female who presents for the following: Follow-up (Patient here today for biopsies and EDCs and recheck AKs. ).  The following portions of the chart were reviewed this encounter and updated as appropriate:   Tobacco   Allergies   Meds   Problems   Med Hx   Surg Hx   Fam Hx       Review of Systems:  No other skin or systemic complaints except as noted in HPI or Assessment and Plan.  Objective  Well appearing patient in no apparent distress; mood and affect are within normal limits.  A focused examination was performed including back. Relevant physical exam findings are noted in the Assessment and Plan.  Left Upper Back 0.6 cm thin pink papule  r/o superficial bcc  Right Upper Back 0.7 cm thin pink papule R/o BCC  Left Mid Back Pink biopsy site  Right Mid Back Pink biopsy site  right upper arm x 5, right wrist x 1, left hand x 1, left wrist x 1 (8) Erythematous thin papules/macules with gritty scale.     Assessment & Plan  Neoplasm of uncertain behavior of skin (2) Left Upper Back  Epidermal / dermal shaving  Lesion diameter (cm):  0.6 Informed consent: discussed and consent obtained   Timeout: patient name, date of birth, surgical site, and procedure verified   Patient was prepped and draped in usual sterile fashion: area prepped with isopropyl alcohol. Anesthesia: the lesion was anesthetized in a standard fashion   Anesthetic:  1% lidocaine w/ epinephrine 1-100,000 buffered w/ 8.4% NaHCO3 Instrument used: flexible razor blade   Hemostasis achieved with: aluminum chloride   Outcome: patient tolerated procedure well   Post-procedure details: wound care instructions given   Additional details:  Mupirocin and a bandage applied  Destruction of lesion  Destruction method: electrodesiccation and curettage   Informed consent: discussed and consent obtained   Timeout:  patient name, date of birth,  surgical site, and procedure verified Patient was prepped and draped in usual sterile fashion: area prepped with isopropyl alcohol. Anesthesia: the lesion was anesthetized in a standard fashion   Anesthetic:  1% lidocaine w/ epinephrine 1-100,000 buffered w/ 8.4% NaHCO3 Curettage performed in three different directions: Yes   Electrodesiccation performed over the curetted area: Yes   Curettage cycles:  3 Final wound size (cm):  0.9 Hemostasis achieved with:  electrodesiccation Outcome: patient tolerated procedure well with no complications   Post-procedure details: wound care instructions given   Additional details:  Mupirocin and a pressure dressing applied  Specimen 1 - Surgical pathology Differential Diagnosis: r/o superficial bcc  Check Margins: No 0.6 cm thin pink papule    Right Upper Back  Epidermal / dermal shaving  Lesion diameter (cm):  0.7 Informed consent: discussed and consent obtained   Timeout: patient name, date of birth, surgical site, and procedure verified   Patient was prepped and draped in usual sterile fashion: area prepped with isopropyl alcohol. Anesthesia: the lesion was anesthetized in a standard fashion   Anesthetic:  1% lidocaine w/ epinephrine 1-100,000 buffered w/ 8.4% NaHCO3 Instrument used: flexible razor blade   Hemostasis achieved with: aluminum chloride   Outcome: patient tolerated procedure well   Post-procedure details: wound care instructions given   Additional details:  Mupirocin and a bandage applied  Destruction of lesion  Destruction method: electrodesiccation and curettage   Informed consent: discussed and consent obtained   Timeout:  patient name, date of birth, surgical site, and procedure verified Patient was prepped and draped in usual sterile fashion: area prepped with isopropyl alcohol. Anesthesia: the lesion was anesthetized in a standard fashion   Anesthetic:  1% lidocaine w/ epinephrine 1-100,000 buffered w/ 8.4%  NaHCO3 Curettage performed in three different directions: Yes   Electrodesiccation performed over the curetted area: Yes   Curettage cycles:  3 Final wound size (cm):  1.1 Hemostasis achieved with:  electrodesiccation Outcome: patient tolerated procedure well with no complications   Post-procedure details: wound care instructions given   Additional details:  Mupirocin and a pressure dressing applied  Specimen 2 - Surgical pathology Differential Diagnosis: R/o BCC  Check Margins: No 0.7 cm thin pink papule   Related Medications mupirocin ointment (BACTROBAN) 2 % Apply 1 application topically daily. With dressing changes  Basal cell carcinoma (BCC) of skin of other part of torso (2) Left Mid Back  Destruction of lesion  Destruction method: electrodesiccation and curettage   Informed consent: discussed and consent obtained   Timeout:  patient name, date of birth, surgical site, and procedure verified Patient was prepped and draped in usual sterile fashion: area prepped with isopropyl alcohol. Anesthesia: the lesion was anesthetized in a standard fashion   Anesthetic:  1% lidocaine w/ epinephrine 1-100,000 buffered w/ 8.4% NaHCO3 Curettage performed in three different directions: Yes   Electrodesiccation performed over the curetted area: Yes   Curettage cycles:  3 Final wound size (cm):  2.5 Hemostasis achieved with:  electrodesiccation Outcome: patient tolerated procedure well with no complications   Post-procedure details: wound care instructions given   Additional details:  Mupirocin and a pressure dressing applied  Right Mid Back  Destruction of lesion  Destruction method: electrodesiccation and curettage   Informed consent: discussed and consent obtained   Timeout:  patient name, date of birth, surgical site, and procedure verified Patient was prepped and draped in usual sterile fashion: area prepped with isopropyl alcohol. Anesthesia: the lesion was anesthetized in  a standard fashion   Anesthetic:  1% lidocaine w/ epinephrine 1-100,000 buffered w/ 8.4% NaHCO3 Curettage performed in three different directions: Yes   Electrodesiccation performed over the curetted area: Yes   Curettage cycles:  3 Final wound size (cm):  0.7 Hemostasis achieved with:  electrodesiccation Outcome: patient tolerated procedure well with no complications   Post-procedure details: wound care instructions given   Additional details:  Mupirocin and a pressure dressing applied  AK (actinic keratosis) (8) right upper arm x 5, right wrist x 1, left hand x 1, left wrist x 1  Hypertrophic  Prior to procedure, discussed risks of blister formation, small wound, skin dyspigmentation, or rare scar following cryotherapy. Recommend Vaseline ointment to treated areas while healing.   Actinic keratoses are precancerous spots that appear secondary to cumulative UV radiation exposure/sun exposure over time. They are chronic with expected duration over 1 year. A portion of actinic keratoses will progress to squamous cell carcinoma of the skin. It is not possible to reliably predict which spots will progress to skin cancer and so treatment is recommended to prevent development of skin cancer.  Recommend daily broad spectrum sunscreen SPF 30+ to sun-exposed areas, reapply every 2 hours as needed.  Recommend staying in the shade or wearing long sleeves, sun glasses (UVA+UVB protection) and wide brim hats (4-inch brim around the entire circumference of the hat). Call for new or changing lesions.   Destruction of lesion - right upper arm x 5, right wrist x  1, left hand x 1, left wrist x 1  Destruction method: cryotherapy   Informed consent: discussed and consent obtained   Lesion destroyed using liquid nitrogen: Yes   Cryotherapy cycles:  2 Outcome: patient tolerated procedure well with no complications   Post-procedure details: wound care instructions given     Next visit - Bx and EDC to  right shoulder posterior - 1.1 cm pink patch r/o bcc  BX and EDC to right shoulder anterior - 1.2 cm pink patch r/o bcc  Bx and EDC to right upper arm - 0.8 cm pink papule r/o bcc EDC to bx proven BCC at right pretibia  Return for Biopsies, as scheduled.  Graciella Belton, RMA, am acting as scribe for Forest Gleason, MD .  Documentation: I have reviewed the above documentation for accuracy and completeness, and I agree with the above.  Forest Gleason, MD

## 2021-11-11 ENCOUNTER — Telehealth: Payer: Self-pay

## 2021-11-11 NOTE — Telephone Encounter (Signed)
-----   Message from Alfonso Patten, MD sent at 11/11/2021  4:08 PM EST ----- 1. Skin , left upper back BASAL CELL CARCINOMA, NODULAR AND INFILTRATIVE PATTERNS, PERIPHERAL MARGIN INVOLVED --> already treated with [facility] will monitor for recurrence  2. Skin , right upper back BASAL CELL CARCINOMA, NODULAR AND INFILTRATIVE PATTERNS, BASE INVOLVED --> already treated with [facility] will monitor for recurrence  MAs please call. Thank you!

## 2021-11-16 ENCOUNTER — Encounter: Payer: Self-pay | Admitting: Dermatology

## 2021-11-18 ENCOUNTER — Other Ambulatory Visit: Payer: Medicare PPO

## 2021-11-21 ENCOUNTER — Ambulatory Visit (INDEPENDENT_AMBULATORY_CARE_PROVIDER_SITE_OTHER): Payer: Medicare PPO

## 2021-11-21 ENCOUNTER — Other Ambulatory Visit: Payer: Self-pay

## 2021-11-21 DIAGNOSIS — R0602 Shortness of breath: Secondary | ICD-10-CM | POA: Diagnosis not present

## 2021-11-21 DIAGNOSIS — R0989 Other specified symptoms and signs involving the circulatory and respiratory systems: Secondary | ICD-10-CM | POA: Diagnosis not present

## 2021-11-21 LAB — ECHOCARDIOGRAM COMPLETE
AR max vel: 1.28 cm2
AV Area VTI: 1.45 cm2
AV Area mean vel: 1.29 cm2
AV Mean grad: 12 mmHg
AV Peak grad: 22 mmHg
Ao pk vel: 2.35 m/s
Area-P 1/2: 3.08 cm2
Calc EF: 54.3 %
S' Lateral: 2.5 cm
Single Plane A2C EF: 53.9 %
Single Plane A4C EF: 53.5 %

## 2021-11-22 ENCOUNTER — Telehealth: Payer: Self-pay | Admitting: Emergency Medicine

## 2021-11-22 NOTE — Telephone Encounter (Signed)
Echo showed LVEF 60-65% (normal), no wall motion abnormalities, impaired relaxation, mild leaky valve. Overall looks good.   Called and spoke with patient. Went over results of both [place] and echo. Pt verbalized understanding.   Wanted to know if she still needed to keep her f/u appt next week. Told her that typically we encourage pts to keep f/u appts so that they are able to discuss the POC based on results. Pt is concerned because she has another appt following her appt next week, so would like to know if she needs to keep the one with Cadence Kathlen Mody, PA-C on 11/27/21 at 10:45.

## 2021-11-22 NOTE — Telephone Encounter (Signed)
-----   Message from [place], PA-C sent at 11/22/2021  9:54 AM EST ----- US carotids showed no significant stenosis

## 2021-11-25 NOTE — Telephone Encounter (Signed)
Furth, Cadence H, PA-C  You 23 minutes ago (1:27 PM)   I would keep the follow-up to reassess symptoms and consider further work-up, unless the patient is feeling fine with no further chest pain or SOB.     Spoke with patient. Patient reports that she has been feeling good, denies chest pain and SOB. Reports that she thinks that she had a panic attack and it was anxiety related, and does not think anything is wrong with her heart.   Appt for 3/1 cancelled. Pt understands that should she have any further chest pain, SOB, or any other concerning symptoms, she can reach out to our office.

## 2021-11-27 ENCOUNTER — Other Ambulatory Visit: Payer: Self-pay

## 2021-11-27 ENCOUNTER — Encounter: Payer: Self-pay | Admitting: Dermatology

## 2021-11-27 ENCOUNTER — Ambulatory Visit: Payer: Medicare PPO

## 2021-11-27 ENCOUNTER — Ambulatory Visit: Payer: Medicare PPO | Admitting: Dermatology

## 2021-11-27 ENCOUNTER — Ambulatory Visit: Payer: Medicare PPO | Admitting: Medical

## 2021-11-27 DIAGNOSIS — L578 Other skin changes due to chronic exposure to nonionizing radiation: Secondary | ICD-10-CM

## 2021-11-27 DIAGNOSIS — C44712 Basal cell carcinoma of skin of right lower limb, including hip: Secondary | ICD-10-CM

## 2021-11-27 DIAGNOSIS — C44612 Basal cell carcinoma of skin of right upper limb, including shoulder: Secondary | ICD-10-CM

## 2021-11-27 DIAGNOSIS — D489 Neoplasm of uncertain behavior, unspecified: Secondary | ICD-10-CM

## 2021-11-27 NOTE — Progress Notes (Signed)
? ?Follow-Up Visit ?  ?Subjective  ?Rachael Austin is a 81 y.o. female who presents for the following: Follow-up (Patient here today for treatment of right posterior shoulder,  right anterior shoulder, right upper arm and right predtibia. Patient has history of bcc and aks. ). ? ?The following portions of the chart were reviewed this encounter and updated as appropriate:  Tobacco  Allergies  Meds  Problems  Med Hx  Surg Hx  Fam Hx   ?  ?Review of Systems: No other skin or systemic complaints except as noted in HPI or Assessment and Plan. ? ? ?Objective  ?Well appearing patient in no apparent distress; mood and affect are within normal limits. ? ?A focused examination was performed including right upper arm, right leg. Relevant physical exam findings are noted in the Assessment and Plan. ? ?right shoulder posterior ?1.1 cm pink patch  ? ?right anterior shoulder ?1.2 cm pink patch  ? ?right upper arm ?0.8 cm pink papule  ? ?right lower pretibia ?Healing scar ? ? ?Assessment & Plan  ?Neoplasm of uncertain behavior (3) ?right shoulder posterior ? ?Destruction of lesion ? ?Destruction method: electrodesiccation and curettage   ?Informed consent: discussed and consent obtained   ?Timeout:  patient name, date of birth, surgical site, and procedure verified ?Patient was prepped and draped in usual sterile fashion: area prepped with isopropyl alcohol. ?Anesthesia: the lesion was anesthetized in a standard fashion   ?Anesthetic:  1% lidocaine w/ epinephrine 1-100,000 buffered w/ 8.4% NaHCO3 ?Curettage performed in three different directions: Yes   ?Electrodesiccation performed over the curetted area: Yes   ?Curettage cycles:  3 ?Final wound size (cm):  1.5 ?Hemostasis achieved with:  electrodesiccation ?Outcome: patient tolerated procedure well with no complications   ?Post-procedure details: wound care instructions given   ?Additional details:  Mupirocin and a pressure dressing applied ? ?Epidermal / dermal  shaving ? ?Lesion diameter (cm):  1.1 ?Informed consent: discussed and consent obtained   ?Timeout: patient name, date of birth, surgical site, and procedure verified   ?Patient was prepped and draped in usual sterile fashion: area prepped with isopropyl alcohol. ?Anesthesia: the lesion was anesthetized in a standard fashion   ?Anesthetic:  1% lidocaine w/ epinephrine 1-100,000 buffered w/ 8.4% NaHCO3 ?Instrument used: flexible razor blade   ?Hemostasis achieved with: aluminum chloride   ?Outcome: patient tolerated procedure well   ?Post-procedure details: wound care instructions given   ?Additional details:  Mupirocin and a bandage applied ? ?Specimen 1 - Surgical pathology ?Differential Diagnosis: r/o bcc ? ?Check Margins: No ? ?right anterior shoulder ? ?Destruction of lesion ? ?Destruction method: electrodesiccation and curettage   ?Informed consent: discussed and consent obtained   ?Timeout:  patient name, date of birth, surgical site, and procedure verified ?Patient was prepped and draped in usual sterile fashion: area prepped with isopropyl alcohol. ?Anesthesia: the lesion was anesthetized in a standard fashion   ?Anesthetic:  1% lidocaine w/ epinephrine 1-100,000 buffered w/ 8.4% NaHCO3 ?Curettage performed in three different directions: Yes   ?Electrodesiccation performed over the curetted area: Yes   ?Curettage cycles:  3 ?Final wound size (cm):  1.2 ?Hemostasis achieved with:  electrodesiccation ?Outcome: patient tolerated procedure well with no complications   ?Post-procedure details: wound care instructions given   ?Additional details:  Mupirocin and a pressure dressing applied ? ?Epidermal / dermal shaving ? ?Lesion diameter (cm):  1.2 ?Informed consent: discussed and consent obtained   ?Timeout: patient name, date of birth, surgical site, and procedure verified   ?  Patient was prepped and draped in usual sterile fashion: area prepped with isopropyl alcohol. ?Anesthesia: the lesion was anesthetized in a  standard fashion   ?Anesthetic:  1% lidocaine w/ epinephrine 1-100,000 buffered w/ 8.4% NaHCO3 ?Instrument used: flexible razor blade   ?Hemostasis achieved with: aluminum chloride   ?Outcome: patient tolerated procedure well   ?Post-procedure details: wound care instructions given   ?Additional details:  Mupirocin and a bandage applied ? ?Specimen 2 - Surgical pathology ?Differential Diagnosis: r/o bcc  ? ?Check Margins: No ? ?right upper arm ? ?Destruction of lesion ? ?Destruction method: electrodesiccation and curettage   ?Informed consent: discussed and consent obtained   ?Timeout:  patient name, date of birth, surgical site, and procedure verified ?Patient was prepped and draped in usual sterile fashion: area prepped with isopropyl alcohol. ?Anesthesia: the lesion was anesthetized in a standard fashion   ?Anesthetic:  1% lidocaine w/ epinephrine 1-100,000 buffered w/ 8.4% NaHCO3 ?Curettage performed in three different directions: Yes   ?Electrodesiccation performed over the curetted area: Yes   ?Curettage cycles:  3 ?Final wound size (cm):  0.7 ?Hemostasis achieved with:  electrodesiccation ?Outcome: patient tolerated procedure well with no complications   ?Post-procedure details: wound care instructions given   ?Additional details:  Mupirocin and a pressure dressing applied ? ?Epidermal / dermal shaving ? ?Lesion diameter (cm):  0.8 ?Informed consent: discussed and consent obtained   ?Timeout: patient name, date of birth, surgical site, and procedure verified   ?Patient was prepped and draped in usual sterile fashion: area prepped with isopropyl alcohol. ?Anesthesia: the lesion was anesthetized in a standard fashion   ?Anesthetic:  1% lidocaine w/ epinephrine 1-100,000 buffered w/ 8.4% NaHCO3 ?Instrument used: flexible razor blade   ?Hemostasis achieved with: aluminum chloride   ?Outcome: patient tolerated procedure well   ?Post-procedure details: wound care instructions given   ?Additional details:  Mupirocin  and a bandage applied ? ?Specimen 3 - Surgical pathology ?Differential Diagnosis: r/o bcc  ? ?Check Margins: No ? ?R/o bcc  ? ? ? ? ?Basal cell carcinoma (BCC) of skin of right lower extremity including hip ?right lower pretibia ? ?Destruction of lesion ? ?Destruction method: electrodesiccation and curettage   ?Informed consent: discussed and consent obtained   ?Timeout:  patient name, date of birth, surgical site, and procedure verified ?Patient was prepped and draped in usual sterile fashion: area prepped with isopropyl alcohol. ?Anesthesia: the lesion was anesthetized in a standard fashion   ?Anesthetic:  1% lidocaine w/ epinephrine 1-100,000 buffered w/ 8.4% NaHCO3 ?Curettage performed in three different directions: Yes   ?Electrodesiccation performed over the curetted area: Yes   ?Curettage cycles:  3 ?Final wound size (cm):  0.9 ?Hemostasis achieved with:  electrodesiccation ?Outcome: patient tolerated procedure well with no complications   ?Post-procedure details: wound care instructions given   ?Additional details:  Mupirocin and a pressure dressing applied ? ?Bx proven:  ?BASAL CELL CARCINOMA, NODULAR PATTERN, PERIPHERAL MARGIN INVOLVED ?Accession: [id number] ? ?Treated with ED&C today  ? ? ? ? ?Actinic Damage ?- chronic, secondary to cumulative UV radiation exposure/sun exposure over time ?- diffuse scaly erythematous macules with underlying dyspigmentation ?- Recommend daily broad spectrum sunscreen SPF 30+ to sun-exposed areas, reapply every 2 hours as needed.  ?- Recommend staying in the shade or wearing long sleeves, sun glasses (UVA+UVB protection) and wide brim hats (4-inch brim around the entire circumference of the hat). ?- Call for new or changing lesions. ? ?Return for TBSE in July or August. ?I, Lenna Sciara  Redmond Pulling, CMA, am acting as scribe for Forest Gleason, MD. ? ?Documentation: I have reviewed the above documentation for accuracy and completeness, and I agree with the above. ? ?Forest Gleason, MD ? ?

## 2021-11-27 NOTE — Patient Instructions (Addendum)
Electrodesiccation and Curettage (Scrape and Burn) Wound Care Instructions  Leave the original bandage on for 24 hours if possible.  If the bandage becomes soaked or soiled before that time, it is OK to remove it and examine the wound.  A small amount of post-operative bleeding is normal.  If excessive bleeding occurs, remove the bandage, place gauze over the site and apply continuous pressure (no peeking) over the area for 30 minutes. If this does not work, please call our clinic as soon as possible or page your doctor if it is after hours.   Once a day, cleanse the wound with soap and water. It is fine to shower. If a thick crust develops you may use a Q-tip dipped into dilute hydrogen peroxide (mix 1:1 with water) to dissolve it.  Hydrogen peroxide can slow the healing process, so use it only as needed.    After washing, apply petroleum jelly (Vaseline) or an antibiotic ointment if your doctor prescribed one for you, followed by a bandage.    For best healing, the wound should be covered with a layer of ointment at all times. If you are not able to keep the area covered with a bandage to hold the ointment in place, this may mean re-applying the ointment several times a day.  Continue this wound care until the wound has healed and is no longer open. It may take several weeks for the wound to heal and close.  Itching and mild discomfort is normal during the healing process.  If you have any discomfort, you can take Tylenol (acetaminophen) or ibuprofen as directed on the bottle. (Please do not take these if you have an allergy to them or cannot take them for another reason).  Some redness, tenderness and white or yellow material in the wound is normal healing.  If the area becomes very sore and red, or develops a thick yellow-green material (pus), it may be infected; please notify us.    Wound healing continues for up to one year following surgery. It is not unusual to experience pain in the scar  from time to time during the interval.  If the pain becomes severe or the scar thickens, you should notify the office.    A slight amount of redness in a scar is expected for the first six months.  After six months, the redness will fade and the scar will soften and fade.  The color difference becomes less noticeable with time.  If there are any problems, return for a post-op surgery check at your earliest convenience.  To improve the appearance of the scar, you can use silicone scar gel, cream, or sheets (such as Mederma or Serica) every night for up to one year. These are available over the counter (without a prescription).  Please call our office at [phone] for any questions or concerns.  Biopsy Wound Care Instructions  Leave the original bandage on for 24 hours if possible.  If the bandage becomes soaked or soiled before that time, it is OK to remove it and examine the wound.  A small amount of post-operative bleeding is normal.  If excessive bleeding occurs, remove the bandage, place gauze over the site and apply continuous pressure (no peeking) over the area for 30 minutes. If this does not work, please call our clinic as soon as possible or page your doctor if it is after hours.   Once a day, cleanse the wound with soap and water. It is fine to shower. If  a thick crust develops you may use a Q-tip dipped into dilute hydrogen peroxide (mix 1:1 with water) to dissolve it.  Hydrogen peroxide can slow the healing process, so use it only as needed.    After washing, apply petroleum jelly (Vaseline) or an antibiotic ointment if your doctor prescribed one for you, followed by a bandage.    For best healing, the wound should be covered with a layer of ointment at all times. If you are not able to keep the area covered with a bandage to hold the ointment in place, this may mean re-applying the ointment several times a day.  Continue this wound care until the wound has healed and is no longer  open.   Itching and mild discomfort is normal during the healing process. However, if you develop pain or severe itching, please call our office.   If you have any discomfort, you can take Tylenol (acetaminophen) or ibuprofen as directed on the bottle. (Please do not take these if you have an allergy to them or cannot take them for another reason).  Some redness, tenderness and white or yellow material in the wound is normal healing.  If the area becomes very sore and red, or develops a thick yellow-green material (pus), it may be infected; please notify us.    If you have stitches, return to clinic as directed to have the stitches removed. You will continue wound care for 2-3 days after the stitches are removed.   Wound healing continues for up to one year following surgery. It is not unusual to experience pain in the scar from time to time during the interval.  If the pain becomes severe or the scar thickens, you should notify the office.    A slight amount of redness in a scar is expected for the first six months.  After six months, the redness will fade and the scar will soften and fade.  The color difference becomes less noticeable with time.  If there are any problems, return for a post-op surgery check at your earliest convenience.  To improve the appearance of the scar, you can use silicone scar gel, cream, or sheets (such as Mederma or Serica) every night for up to one year. These are available over the counter (without a prescription).  Please call our office at [phone] for any questions or concerns.     If You Need Anything After Your Visit  If you have any questions or concerns for your doctor, please call our main line at [phone] and press option 4 to reach your doctor's medical assistant. If no one answers, please leave a voicemail as directed and we will return your call as soon as possible. Messages left after 4 pm will be answered the following business day.   You  may also send [place] a message via [place]. We typically respond to MyChart messages within 1-2 business days.  For prescription refills, please ask your pharmacy to contact our office. Our fax number is [phone].  If you have an urgent issue when the clinic is closed that cannot wait until the next business day, you can page your doctor at the number below.    Please note that while we do our best to be available for urgent issues outside of office hours, we are not available 24/7.   If you have an urgent issue and are unable to reach [place], you may choose to seek medical care at your doctor's office, retail clinic, urgent care center,  or emergency room.  If you have a medical emergency, please immediately call 911 or go to the emergency department.  Pager Numbers  - Dr. Nehemiah Massed: [phone]  - Dr. Laurence Ferrari: [phone]  - Dr. Nicole Kindred: [phone]  In the event of inclement weather, please call our main line at [phone] for an update on the status of any delays or closures.  Dermatology Medication Tips: Please keep the boxes that topical medications come in in order to help keep track of the instructions about where and how to use these. Pharmacies typically print the medication instructions only on the boxes and not directly on the medication tubes.   If your medication is too expensive, please contact our office at [phone] option 4 or send [place] a message through [place].   We are unable to tell what your co-pay for medications will be in advance as this is different depending on your insurance coverage. However, we may be able to find a substitute medication at lower cost or fill out paperwork to get insurance to cover a needed medication.   If a prior authorization is required to get your medication covered by your insurance company, please allow [place] 1-2 business days to complete this process.  Drug prices often vary depending on where the prescription is filled and some  pharmacies may offer cheaper prices.  The website www.goodrx.com contains coupons for medications through different pharmacies. The prices here do not account for what the cost may be with help from insurance (it may be cheaper with your insurance), but the website can give you the price if you did not use any insurance.  - You can print the associated coupon and take it with your prescription to the pharmacy.  - You may also stop by our office during regular business hours and pick up a GoodRx coupon card.  - If you need your prescription sent electronically to a different pharmacy, notify our office through [facility] or by phone at [phone] option 4.     Si Usted Necesita Algo Despus de Su Visita  Tambin puede enviarnos un mensaje a travs de Pharmacist, community. Por lo general respondemos a los mensajes de MyChart en el transcurso de 1 a 2 das hbiles.  Para renovar recetas, por favor pida a su farmacia que se ponga en contacto con nuestra oficina. Harland Dingwall de fax es [place] [phone].  Si tiene un asunto urgente cuando la clnica est cerrada y que no puede esperar hasta el siguiente da hbil, puede llamar/localizar a su doctor(a) al nmero que aparece a continuacin.   Por favor, tenga en cuenta que aunque hacemos todo lo posible para estar disponibles para asuntos urgentes fuera del horario de [place], no estamos disponibles las 24 horas del da, los 7 das de la [place].   Si tiene un problema urgente y no puede comunicarse con nosotros, puede optar por buscar atencin mdica  en el consultorio de su doctor(a), en una clnica privada, en un centro de atencin urgente o en una sala de emergencias.  Si tiene Engineering geologist, por favor llame inmediatamente al 911 o vaya a la sala de emergencias.  Nmeros de bper  - Dr. Nehemiah Massed: [phone]  - Dra. Moye: [phone]  - Dra. Nicole Kindred: [phone]  En caso de inclemencias del [place], por favor llame a Johnsie Kindred  principal al [phone] para una actualizacin sobre el [place] de cualquier retraso o cierre.  Consejos para la medicacin en dermatologa: Por favor, guarde las [employer]  vienen los medicamentos de uso tpico para ayudarle a seguir las instrucciones sobre dnde y cmo usarlos. Las farmacias generalmente imprimen las instrucciones del medicamento slo en las cajas y no directamente en los tubos del [place].   Si su medicamento es muy caro, por favor, pngase en contacto con Zigmund Daniel llamando al [phone] y presione la opcin 4 o envenos un mensaje a travs de Pharmacist, community.   No podemos decirle cul ser su copago por los medicamentos por adelantado ya que esto es diferente dependiendo de la cobertura de su seguro. Sin embargo, es posible que podamos encontrar un medicamento sustituto a Electrical engineer un formulario para que el seguro cubra el medicamento que se considera necesario.   Si se requiere una autorizacin previa para que su compaa de seguros [place] su medicamento, por favor permtanos de 1 a 2 das hbiles para completar este proceso.  Los precios de los medicamentos varan con frecuencia dependiendo del Environmental consultant de dnde se surte la receta y alguna farmacias pueden ofrecer precios ms baratos.  El sitio web www.goodrx.com tiene cupones para medicamentos de Airline pilot. Los precios aqu no tienen en cuenta lo que podra costar con la ayuda del seguro (puede ser ms barato con su seguro), pero el sitio web puede darle el precio si no utiliz Research scientist (physical sciences).  - Puede imprimir el cupn correspondiente y llevarlo con su receta a la farmacia.  - Tambin puede pasar por nuestra oficina durante el horario de atencin regular y Charity fundraiser una tarjeta de cupones de GoodRx.  - Si necesita que su receta se enve electrnicamente a una farmacia diferente, informe a nuestra oficina a travs de MyChart de [place] o por telfono llamando al [phone] y presione la opcin  4.

## 2021-12-02 ENCOUNTER — Telehealth: Payer: Self-pay

## 2021-12-02 NOTE — Telephone Encounter (Signed)
Pt returned call during lunch. Left VM to return my call/ aw ?

## 2021-12-02 NOTE — Telephone Encounter (Signed)
-----   Message from Alfonso Patten, MD sent at 11/28/2021  5:49 PM EST ----- ?1. Skin , right shoulder posterior ?SUPERFICIAL BASAL CELL CARCINOMA, BASE INVOLVED ?2. Skin , right anterior shoulder ?SUPERFICIAL BASAL CELL CARCINOMA, PERIPHERAL MARGIN INVOLVED ?3. Skin , right upper arm ?SUPERFICIAL BASAL CELL CARCINOMA, PERIPHERAL MARGIN INVOLVED ? ?All already treated with ED&C. ? ?MAs please call. Thank you! ?

## 2021-12-02 NOTE — Telephone Encounter (Signed)
Left pt msg to call for bx results/sh 

## 2021-12-02 NOTE — Telephone Encounter (Signed)
Patient advised of BX results .aw 

## 2021-12-12 ENCOUNTER — Ambulatory Visit: Payer: Medicare PPO | Admitting: Internal Medicine

## 2021-12-12 ENCOUNTER — Encounter: Payer: Self-pay | Admitting: Internal Medicine

## 2021-12-12 ENCOUNTER — Other Ambulatory Visit: Payer: Self-pay

## 2021-12-12 VITALS — BP 132/72 | HR 88 | Temp 97.8°F | Resp 16 | Ht 64.0 in | Wt 148.0 lb

## 2021-12-12 DIAGNOSIS — I10 Essential (primary) hypertension: Secondary | ICD-10-CM

## 2021-12-12 DIAGNOSIS — E7800 Pure hypercholesterolemia, unspecified: Secondary | ICD-10-CM

## 2021-12-12 DIAGNOSIS — K219 Gastro-esophageal reflux disease without esophagitis: Secondary | ICD-10-CM | POA: Diagnosis not present

## 2021-12-12 DIAGNOSIS — F439 Reaction to severe stress, unspecified: Secondary | ICD-10-CM | POA: Diagnosis not present

## 2021-12-12 DIAGNOSIS — R739 Hyperglycemia, unspecified: Secondary | ICD-10-CM | POA: Diagnosis not present

## 2021-12-12 DIAGNOSIS — R251 Tremor, unspecified: Secondary | ICD-10-CM

## 2021-12-12 DIAGNOSIS — I700 Atherosclerosis of aorta: Secondary | ICD-10-CM

## 2021-12-12 MED ORDER — ESOMEPRAZOLE MAGNESIUM 40 MG PO CPDR: 40.0000 mg | DELAYED_RELEASE_CAPSULE | Freq: Every day | ORAL | 1 refills | Status: DC

## 2021-12-12 MED ORDER — LOVASTATIN 20 MG PO TABS: 20.0000 mg | ORAL_TABLET | Freq: Every day | ORAL | 1 refills | Status: DC

## 2021-12-12 MED ORDER — HYDRALAZINE HCL 25 MG PO TABS: 25.0000 mg | ORAL_TABLET | Freq: Two times a day (BID) | ORAL | 3 refills | Status: DC

## 2021-12-12 NOTE — Progress Notes (Signed)
? ?Subjective:  ? ? [name], female    DOB: 1941-04-10, 81 y.o.   MRN: [id number] ? ?This visit occurred during the SARS-CoV-2 public health emergency.  Safety protocols were in place, including screening questions prior to the visit, additional usage of staff PPE, and extensive cleaning of exam room while observing appropriate contact time as indicated for disinfecting solutions.  ? ?Patient here for a scheduled follow up.  ? ?Chief Complaint  ?Patient presents with  ? Stress  ? Hypertension  ? Anxiety  ? .  ? ?HPI ?Saw cardiology - ECHO EF 60-65%, no wall motion abnormality, impaired relaxation.  Appears to be doing better.  No chest pain or sob reported.  No increased cough or congestion.  On zoloft.  Increased stress.  Discussed.  Overall feels zoloft is helping.  Does not feel needs to increase the dose.  No acid reflux reported.  No abdominal pain.  Does report persistent tremor - worse in am.  Notices when holding hymn book.  Notices when writing.   ? ? ?Past Medical History:  ?Diagnosis Date  ? Basal cell carcinoma 06/21/2020  ? left post shoulder sup, left mid chest, left lat thigh  ? Basal cell carcinoma 11/07/2021  ? L upper back, EDC  ? Basal cell carcinoma 11/07/2021  ? R upper back, EDC  ? Basal cell carcinoma 11/27/2021  ? right upper arm, EDC  ? Basal cell carcinoma 11/27/2021  ? right shoulder posterior, EDC  ? Basal cell carcinoma 11/27/2021  ? right anterior shoulder, EDC  ? Basal cell carcinoma (BCC) 06/13/2021  ? left dorsal foot, EDC 10/02/2021  ? Basal cell carcinoma (BCC) 06/13/2021  ? right postauricular tx'd w/ EDC  ? BCC (basal cell carcinoma of skin) 03/30/2007  ? R inf med pretibial - BCC  ? BCC (basal cell carcinoma of skin) 10/12/2013  ? L nasal ala - BCC  ? BCC (basal cell carcinoma of skin) 03/10/2018  ? L mid dorsum med forearm - superficial BCC   ? BCC (basal cell carcinoma) 10/02/2021  ? left dorsal foot proximal, EDC 10/02/2021  ? BCC (basal cell carcinoma)  10/02/2021  ? left mid back, superficial EDC 11/07/21  ? BCC (basal cell carcinoma) 10/02/2021  ? right pretibia  ? BCC (basal cell carcinoma) 10/02/2021  ? right mid back, superficial EDC 11/07/21  ? Breast screening, unspecified 2013  ? Cancer [facility]) 1992  ? skin  ? Degenerative disk disease   ? GERD (gastroesophageal reflux disease)   ? History of basal cell carcinoma (BCC) 08/29/2020  ?  left inferior knee lateral, , left inferior knee medial, right pretibia  ? History of SCC (squamous cell carcinoma) of skin 08/29/2020  ? right posterior calf, left lateral calf, and   ? Hypercholesterolemia 2008  ? Interstitial cystitis   ? followed by Dr Jacqlyn Larsen  ? Other sign and symptom in breast 2013  ? Left upper outer quadrant breast "soreness" Ultrasound exam of right breast in the 2 o'clock position with the breast distracted medially showed a 0.3-0.4 with 0.5 cm simple cyst. In the 1 o'clock position where pt reported tenderness US exam was negatiive.  Because of her history of intermittent nipple drainage, ultrasound was completed of the retroareolar area.  ? Personal history of tobacco use, presenting hazards to health   ? PONV (postoperative nausea and vomiting)   ? SCC (squamous cell carcinoma) 03/28/2008  ? R dorsum hand - SCC  ? SCC (squamous cell  carcinoma) 05/09/2009  ? R lat lower leg - SCCIS  ? SCC (squamous cell carcinoma) 05/09/2009  ? L lat lower leg - SCCIS  ? SCC (squamous cell carcinoma) 04/07/2013  ? L lower leg - SCCIS  ? SCC (squamous cell carcinoma) 04/27/2013  ? R forearm - SCC  ? SCC (squamous cell carcinoma) 04/28/2017  ? L lat knee - SCCIS  ? SCC (squamous cell carcinoma) 05/12/2018  ? L prox dorsum forearm - SCC  ? SCC (squamous cell carcinoma) 06/13/2021  ? left forearm tx'd w/ EDC  ? SCC (squamous cell carcinoma), leg, right 03/30/2007  ? R sup pretibial - SCCIS  ? SCC (squamous cell carcinoma);BCC 10/12/2013  ? Mid back - superficial BCC with SCCIS   ? Special screening for malignant neoplasms, colon    ? 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 left dorsal forearm, left lat calf  ? ?Past Surgical History:  ?Procedure Laterality Date  ? ABDOMINAL HYSTERECTOMY  1992  ? APPENDECTOMY    ? CERVICAL DISCECTOMY    ? S/P C7-T1 discectomy with fusion  ? CHOLECYSTECTOMY  1990  ? COLONOSCOPY WITH PROPOFOL N/A 02/14/2016  ? Procedure: COLONOSCOPY WITH PROPOFOL;  Surgeon: Lucilla Lame, MD;  Location: [place];  Service: Endoscopy;  Laterality: N/A;  PT WOULD LIKE 10 ARRIVAL TIME OR LATER  ? DILATION AND CURETTAGE OF UTERUS    ? ESOPHAGOGASTRODUODENOSCOPY (EGD) WITH PROPOFOL N/A 02/14/2016  ? Procedure: ESOPHAGOGASTRODUODENOSCOPY (EGD) WITH PROPOFOL with dialtion;  Surgeon: Lucilla Lame, MD;  Location: [place];  Service: Endoscopy;  Laterality: N/A;  ? ESOPHAGOGASTRODUODENOSCOPY (EGD) WITH PROPOFOL N/A 04/13/2019  ? Procedure: ESOPHAGOGASTRODUODENOSCOPY (EGD) WITH PROPOFOL;  Surgeon: Toledo, Benay Pike, MD;  Location: [facility];  Service: Gastroenterology;  Laterality: N/A;  ? MELANOMA EXCISION    ? removed from Left calf 1994  ? ?Family History  ?Problem Relation Age of Onset  ? Hodgkin's lymphoma Mother   ? Heart failure Father   ? Heart attack Father   ? Arthritis Sister   ?     Three sisters w/ degeneratve disk disease  ? Headache Sister   ?     Two sisters hx of headache  ? Breast cancer Neg Hx   ? Colon cancer Neg Hx   ? Bladder Cancer Neg Hx   ? Kidney cancer Neg Hx   ? ?Social History  ? ?Socioeconomic History  ? Marital status: Widowed  ?  Spouse name: Not on file  ? Number of children: 2  ? Years of education: Not on file  ? Highest education level: Not on file  ?Occupational History  ? Not on file  ?Tobacco Use  ? Smoking status: Former  ?  Packs/day: 1.00  ?  Years: 15.00  ?  Pack years: 15.00  ?  Types: Cigarettes  ? Smokeless tobacco: Never  ?Vaping Use  ? Vaping Use: Never used  ?Substance and Sexual Activity  ? Alcohol use: No  ?  Alcohol/week: 0.0 standard drinks  ? Drug use: No  ?  Sexual activity: Not on file  ?Other Topics Concern  ? Not on file  ?Social History Narrative  ? Married and has 2 children, daughters.  ? ?Social Determinants of Health  ? ?Financial Resource Strain: Not on file  ?Food Insecurity: Not on file  ?Transportation Needs: Not on file  ?Physical Activity: Not on file  ?Stress: Not on file  ?Social Connections: Not on file  ? ? ? ?Review of Systems  ?  Constitutional:  Negative for appetite change and unexpected weight change.  ?HENT:  Negative for congestion and sinus pressure.   ?Respiratory:  Negative for cough, chest tightness and shortness of breath.   ?Cardiovascular:  Negative for chest pain, palpitations and leg swelling.  ?Gastrointestinal:  Negative for abdominal pain, diarrhea, nausea and vomiting.  ?Genitourinary:  Negative for difficulty urinating and dysuria.  ?Musculoskeletal:  Negative for joint swelling and myalgias.  ?Skin:  Negative for color change and rash.  ?Neurological:  Positive for tremors. Negative for dizziness, light-headedness and headaches.  ?Psychiatric/Behavioral:  Negative for agitation and dysphoric mood.   ? ?   ?Objective:  ?  ? ?BP 132/72   Pulse 88   Temp 97.8 ?F (36.6 ?C)   Resp 16   Ht '5\' 4"'$  (1.626 m)   Wt 148 lb (67.1 kg)   LMP 08/09/2012   SpO2 98%   BMI 25.40 kg/m?  ?Wt Readings from Last 3 Encounters:  ?12/12/21 148 lb (67.1 kg)  ?10/29/21 147 lb 8 oz (66.9 kg)  ?10/22/21 149 lb 12.8 oz (67.9 kg)  ? ? ?Physical Exam ?Vitals reviewed.  ?Constitutional:   ?   General: She is not in acute distress. ?   Appearance: Normal appearance.  ?HENT:  ?   Head: Normocephalic and atraumatic.  ?   Right Ear: External ear normal.  ?   Left Ear: External ear normal.  ?Eyes:  ?   General: No scleral icterus.    ?   Right eye: No discharge.     ?   Left eye: No discharge.  ?   Conjunctiva/sclera: Conjunctivae normal.  ?Neck:  ?   Thyroid: No thyromegaly.  ?Cardiovascular:  ?   Rate and Rhythm: Normal rate and regular rhythm.  ?Pulmonary:  ?    Effort: No respiratory distress.  ?   Breath sounds: Normal breath sounds. No wheezing.  ?Abdominal:  ?   General: Bowel sounds are normal.  ?   Palpations: Abdomen is soft.  ?   Tenderness: There is no abdomi

## 2021-12-15 ENCOUNTER — Encounter: Payer: Self-pay | Admitting: Internal Medicine

## 2021-12-15 NOTE — Assessment & Plan Note (Signed)
Intolerant to amlodipine.  On amlodipine. Follow pressures.  Follow metabolic panel. Treat anxiety.   ?

## 2021-12-15 NOTE — Assessment & Plan Note (Signed)
Has been evaluated previously by neurology.  MRI without acute changes.  Discussed with her today.  She feels increased stress contributing. Continue zoloft.  Will notify me if desires any further evaluation.   ?

## 2021-12-15 NOTE — Assessment & Plan Note (Signed)
Increased stress.  Continue zoloft.  Follow.  ?

## 2021-12-15 NOTE — Assessment & Plan Note (Signed)
Continue mevacor.  Low cholesterol diet and exercise.  Follow lipid panel and liver function tests.  

## 2021-12-15 NOTE — Assessment & Plan Note (Signed)
Low carb diet and exercise.  Follow met b and a1c.   

## 2021-12-15 NOTE — Assessment & Plan Note (Signed)
No upper symptoms reported.  Continue nexium.  

## 2021-12-15 NOTE — Assessment & Plan Note (Signed)
Continue mevacor °

## 2021-12-18 ENCOUNTER — Other Ambulatory Visit: Payer: Self-pay | Admitting: Internal Medicine

## 2022-01-20 ENCOUNTER — Other Ambulatory Visit: Payer: Self-pay | Admitting: Family

## 2022-01-27 ENCOUNTER — Other Ambulatory Visit: Payer: Self-pay | Admitting: Family

## 2022-01-27 NOTE — Telephone Encounter (Signed)
Pt need refill on sertraline sent to [facility] on [address]. Pt is completely out  ?

## 2022-01-27 NOTE — Telephone Encounter (Signed)
Rachael Austin called from [place] stating pt is out of medication. The pharmacy has given her 3 pills until she receives a refill from the provider ?

## 2022-02-14 ENCOUNTER — Other Ambulatory Visit (INDEPENDENT_AMBULATORY_CARE_PROVIDER_SITE_OTHER): Payer: Medicare PPO

## 2022-02-14 DIAGNOSIS — I10 Essential (primary) hypertension: Secondary | ICD-10-CM

## 2022-02-14 DIAGNOSIS — E7800 Pure hypercholesterolemia, unspecified: Secondary | ICD-10-CM | POA: Diagnosis not present

## 2022-02-14 DIAGNOSIS — R739 Hyperglycemia, unspecified: Secondary | ICD-10-CM

## 2022-02-14 LAB — LIPID PANEL
Cholesterol: 184 mg/dL (ref 0–200)
HDL: 76.2 mg/dL (ref >39.00)
LDL Cholesterol: 96 mg/dL (ref 0–99)
NonHDL: 107.58
Total CHOL/HDL Ratio: 2
Triglycerides: 57 mg/dL (ref 0.0–149.0)
VLDL: 11.4 mg/dL (ref 0.0–40.0)

## 2022-02-14 LAB — HEPATIC FUNCTION PANEL
ALT: 11 U/L (ref 0–35)
AST: 16 U/L (ref 0–37)
Albumin: 4.3 g/dL (ref 3.5–5.2)
Alkaline Phosphatase: 70 U/L (ref 39–117)
Bilirubin, Direct: 0.2 mg/dL (ref 0.0–0.3)
Total Bilirubin: 0.7 mg/dL (ref 0.2–1.2)
Total Protein: 6.3 g/dL (ref 6.0–8.3)

## 2022-02-14 LAB — BASIC METABOLIC PANEL
BUN: 12 mg/dL (ref 6–23)
CO2: 28 mEq/L (ref 19–32)
Calcium: 9.2 mg/dL (ref 8.4–10.5)
Chloride: 102 mEq/L (ref 96–112)
Creatinine, Ser: 0.99 mg/dL (ref 0.40–1.20)
GFR: 53.26 mL/min — ABNORMAL LOW (ref >60.00)
Glucose, Bld: 97 mg/dL (ref 70–99)
Potassium: 4.3 mEq/L (ref 3.5–5.1)
Sodium: 138 mEq/L (ref 135–145)

## 2022-02-14 LAB — HEMOGLOBIN A1C: Hgb A1c MFr Bld: 5.7 % (ref 4.6–6.5)

## 2022-02-20 ENCOUNTER — Ambulatory Visit: Payer: Medicare PPO | Admitting: Internal Medicine

## 2022-02-20 ENCOUNTER — Encounter: Payer: Self-pay | Admitting: Internal Medicine

## 2022-02-20 VITALS — BP 138/78 | HR 95 | Temp 97.6°F | Resp 17 | Ht 63.0 in | Wt 146.0 lb

## 2022-02-20 DIAGNOSIS — I700 Atherosclerosis of aorta: Secondary | ICD-10-CM

## 2022-02-20 DIAGNOSIS — Z1231 Encounter for screening mammogram for malignant neoplasm of breast: Secondary | ICD-10-CM | POA: Diagnosis not present

## 2022-02-20 DIAGNOSIS — K5900 Constipation, unspecified: Secondary | ICD-10-CM | POA: Diagnosis not present

## 2022-02-20 DIAGNOSIS — N3010 Interstitial cystitis (chronic) without hematuria: Secondary | ICD-10-CM

## 2022-02-20 DIAGNOSIS — K219 Gastro-esophageal reflux disease without esophagitis: Secondary | ICD-10-CM

## 2022-02-20 DIAGNOSIS — E7800 Pure hypercholesterolemia, unspecified: Secondary | ICD-10-CM

## 2022-02-20 DIAGNOSIS — R739 Hyperglycemia, unspecified: Secondary | ICD-10-CM

## 2022-02-20 DIAGNOSIS — Z0000 Encounter for general adult medical examination without abnormal findings: Secondary | ICD-10-CM | POA: Diagnosis not present

## 2022-02-20 DIAGNOSIS — I10 Essential (primary) hypertension: Secondary | ICD-10-CM | POA: Diagnosis not present

## 2022-02-20 DIAGNOSIS — F439 Reaction to severe stress, unspecified: Secondary | ICD-10-CM

## 2022-02-20 DIAGNOSIS — R251 Tremor, unspecified: Secondary | ICD-10-CM

## 2022-02-20 MED ORDER — SERTRALINE HCL 50 MG PO TABS: ORAL_TABLET | ORAL | 2 refills | Status: DC

## 2022-02-20 NOTE — Patient Instructions (Signed)
Decrease zoloft to 1/2 tablet per day

## 2022-02-20 NOTE — Progress Notes (Signed)
[name], female   DOB: [birth year], 81 y.o.   MRN: [id number]   Subjective:    [name], female    DOB: 17-May-1941, 81 y.o.   MRN: [id number]   Patient here for her physical exam.   Chief Complaint  Patient presents with   Follow-up    Yearly CPE   .   HPI Several concerns.  Reports increased fatigue, general fatigue.  No chest pain or increased sob.  No cough or congestion.  No acid reflux reported.  No abdominal pain.  Bowels - has noticed some minimal issues with constipation and stools are "squiggly" shaped.  Discussed continuing miralax - but using daily or using benefiber.  Sleeping better.  Does report - feels hand tremor is worse.  Right and left.  Some unsteadiness when walking.  No dizziness.  No light headedness.  Vision is changing.  Needs new glasses.    Past Medical History:  Diagnosis Date   Basal cell carcinoma 06/21/2020   left post shoulder sup, left mid chest, left lat thigh   Basal cell carcinoma 11/07/2021   L upper back, EDC   Basal cell carcinoma 11/07/2021   R upper back, EDC   Basal cell carcinoma 11/27/2021   right upper arm, EDC   Basal cell carcinoma 11/27/2021   right shoulder posterior, EDC   Basal cell carcinoma 11/27/2021   right anterior shoulder, EDC   Basal cell carcinoma (BCC) 06/13/2021   left dorsal foot, EDC 10/02/2021   Basal cell carcinoma (BCC) 06/13/2021   right postauricular tx'd w/ EDC   BCC (basal cell carcinoma of skin) 03/30/2007   R inf med pretibial - BCC   BCC (basal cell carcinoma of skin) 10/12/2013   L nasal ala - BCC   BCC (basal cell carcinoma of skin) 03/10/2018   L mid dorsum med forearm - superficial BCC    BCC (basal cell carcinoma) 10/02/2021   left dorsal foot proximal, EDC 10/02/2021   BCC (basal cell carcinoma) 10/02/2021   left mid back, superficial EDC 11/07/21   BCC (basal cell carcinoma) 10/02/2021   right pretibia   BCC (basal cell carcinoma) 10/02/2021   right mid  back, superficial EDC 11/07/21   Breast screening, unspecified 2013   Cancer ([place]) 1992   skin   Degenerative disk disease    GERD (gastroesophageal reflux disease)    History of basal cell carcinoma (BCC) 08/29/2020    left inferior knee lateral, , left inferior knee medial, right pretibia   History of SCC (squamous cell carcinoma) of skin 08/29/2020   right posterior calf, left lateral calf, and    Hypercholesterolemia 2008   Interstitial cystitis    followed by Dr Jacqlyn Larsen   Other sign and symptom in breast 2013   Left upper outer quadrant breast "soreness" Ultrasound exam of right breast in the 2 o'clock position with the breast distracted medially showed a 0.3-0.4 with 0.5 cm simple cyst. In the 1 o'clock position where pt reported tenderness US exam was negatiive.  Because of her history of intermittent nipple drainage, ultrasound was completed of the retroareolar area.   Personal history of tobacco use, presenting hazards to health    PONV (postoperative nausea and vomiting)    SCC (squamous cell carcinoma) 03/28/2008   R dorsum hand - SCC   SCC (squamous cell carcinoma) 05/09/2009   R lat lower leg - SCCIS   SCC (squamous cell carcinoma) 05/09/2009   L lat  lower leg - SCCIS   SCC (squamous cell carcinoma) 04/07/2013   L lower leg - SCCIS   SCC (squamous cell carcinoma) 04/27/2013   R forearm - SCC   SCC (squamous cell carcinoma) 04/28/2017   L lat knee - SCCIS   SCC (squamous cell carcinoma) 05/12/2018   L prox dorsum forearm - SCC   SCC (squamous cell carcinoma) 06/13/2021   left forearm tx'd w/ EDC   SCC (squamous cell carcinoma), leg, right 03/30/2007   R sup pretibial - SCCIS   SCC (squamous cell carcinoma);BCC 10/12/2013   Mid back - superficial BCC with SCCIS    Special screening for malignant neoplasms, colon    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   left dorsal forearm, left lat calf   Past Surgical History:  Procedure Laterality Date   ABDOMINAL  HYSTERECTOMY  1992   APPENDECTOMY     CERVICAL DISCECTOMY     S/P C7-T1 discectomy with fusion   CHOLECYSTECTOMY  1990   COLONOSCOPY WITH PROPOFOL N/A 02/14/2016   Procedure: COLONOSCOPY WITH PROPOFOL;  Surgeon: Lucilla Lame, MD;  Location: [facility];  Service: Endoscopy;  Laterality: N/A;  PT WOULD LIKE 10 ARRIVAL TIME OR LATER   DILATION AND CURETTAGE OF UTERUS     ESOPHAGOGASTRODUODENOSCOPY (EGD) WITH PROPOFOL N/A 02/14/2016   Procedure: ESOPHAGOGASTRODUODENOSCOPY (EGD) WITH PROPOFOL with dialtion;  Surgeon: Lucilla Lame, MD;  Location: [facility];  Service: Endoscopy;  Laterality: N/A;   ESOPHAGOGASTRODUODENOSCOPY (EGD) WITH PROPOFOL N/A 04/13/2019   Procedure: ESOPHAGOGASTRODUODENOSCOPY (EGD) WITH PROPOFOL;  Surgeon: Toledo, Benay Pike, MD;  Location: [facility];  Service: Gastroenterology;  Laterality: N/A;   MELANOMA EXCISION     removed from Left calf 1994   Family History  Problem Relation Age of Onset   Hodgkin's lymphoma Mother    Heart failure Father    Heart attack Father    Arthritis Sister        Three sisters w/ degeneratve disk disease   Headache Sister        Two sisters hx of headache   Breast cancer Neg Hx    Colon cancer Neg Hx    Bladder Cancer Neg Hx    Kidney cancer Neg Hx    Social History   Socioeconomic History   Marital status: Widowed    Spouse name: Not on file   Number of children: 2   Years of education: Not on file   Highest education level: Not on file  Occupational History   Not on file  Tobacco Use   Smoking status: Former    Packs/day: 1.00    Years: 15.00    Pack years: 15.00    Types: Cigarettes   Smokeless tobacco: Never  Vaping Use   Vaping Use: Never used  Substance and Sexual Activity   Alcohol use: No    Alcohol/week: 0.0 standard drinks   Drug use: No   Sexual activity: Not on file  Other Topics Concern   Not on file  Social History Narrative   Married and has 2 children, daughters.   Social  Determinants of Health   Financial Resource Strain: Not on file  Food Insecurity: Not on file  Transportation Needs: Not on file  Physical Activity: Not on file  Stress: Not on file  Social Connections: Not on file     Review of Systems  Constitutional:  Positive for fatigue. Negative for appetite change and unexpected weight change.  HENT:  Negative for congestion, sinus  pressure and sore throat.   Eyes:  Negative for pain and visual disturbance.  Respiratory:  Negative for cough, chest tightness and shortness of breath.   Cardiovascular:  Negative for chest pain, palpitations and leg swelling.  Gastrointestinal:  Negative for abdominal pain, diarrhea, nausea and vomiting.       Minimal constipation and stool shape change as outlined.   Genitourinary:  Negative for difficulty urinating and dysuria.  Musculoskeletal:  Negative for joint swelling and myalgias.  Skin:  Negative for color change and rash.  Neurological:  Positive for tremors. Negative for dizziness and headaches.       Unsteadiness.    Hematological:  Negative for adenopathy. Does not bruise/bleed easily.  Psychiatric/Behavioral:  Negative for agitation and dysphoric mood.       Objective:     BP 138/78 (BP Location: Left Arm, Patient Position: Sitting, Cuff Size: Small)   Pulse 95   Temp 97.6 F (36.4 C) (Temporal)   Resp 17   Ht $R'5\' 3"'GG$  (1.6 m)   Wt 146 lb (66.2 kg)   LMP 08/09/2012   SpO2 99%   BMI 25.86 kg/m  Wt Readings from Last 3 Encounters:  02/20/22 146 lb (66.2 kg)  12/12/21 148 lb (67.1 kg)  10/29/21 147 lb 8 oz (66.9 kg)    Physical Exam Vitals reviewed.  Constitutional:      General: She is not in acute distress.    Appearance: Normal appearance. She is well-developed.  HENT:     Head: Normocephalic and atraumatic.     Right Ear: External ear normal.     Left Ear: External ear normal.  Eyes:     General: No scleral icterus.       Right eye: No discharge.        Left eye: No  discharge.     Conjunctiva/sclera: Conjunctivae normal.  Neck:     Thyroid: No thyromegaly.  Cardiovascular:     Rate and Rhythm: Normal rate and regular rhythm.  Pulmonary:     Effort: No tachypnea, accessory muscle usage or respiratory distress.     Breath sounds: Normal breath sounds. No decreased breath sounds or wheezing.  Chest:  Breasts:    Right: No inverted nipple, mass, nipple discharge or tenderness (no axillary adenopathy).     Left: No inverted nipple, mass, nipple discharge or tenderness (no axilarry adenopathy).  Abdominal:     General: Bowel sounds are normal.     Palpations: Abdomen is soft.     Tenderness: There is no abdominal tenderness.  Musculoskeletal:        General: No swelling or tenderness.     Cervical back: Neck supple.  Lymphadenopathy:     Cervical: No cervical adenopathy.  Skin:    Findings: No erythema or rash.  Neurological:     Mental Status: She is alert and oriented to person, place, and time.  Psychiatric:        Mood and Affect: Mood normal.        Behavior: Behavior normal.     Outpatient Encounter Medications as of 02/20/2022  Medication Sig   acetaminophen (TYLENOL) 325 MG tablet Take 650 mg by mouth as needed.   aspirin 81 MG EC tablet Take 1 tablet (81 mg total) by mouth daily. Swallow whole.   betamethasone dipropionate 0.05 % cream Apply topically 2 (two) times daily. Do no use more that 7-10 days without calling.   busPIRone (BUSPAR) 5 MG tablet Take 1 tablet (5 mg  total) by mouth 2 (two) times daily.   esomeprazole (NEXIUM) 40 MG capsule Take 1 capsule (40 mg total) by mouth daily.   hydrALAZINE (APRESOLINE) 25 MG tablet Take 1 tablet (25 mg total) by mouth 2 (two) times daily.   ibuprofen (ADVIL,MOTRIN) 200 MG tablet Take 200 mg by mouth every 6 (six) hours as needed.   imipramine (TOFRANIL) 10 MG tablet Take 1 tablet (10 mg total) by mouth at bedtime.   lovastatin (MEVACOR) 20 MG tablet Take 1 tablet (20 mg total) by mouth at  bedtime.   mupirocin ointment (BACTROBAN) 2 % Apply 1 application topically daily. With dressing changes   nystatin cream (MYCOSTATIN) Apply 1 application topically 2 (two) times daily.   triamcinolone cream (KENALOG) 0.1 % Apply 1 application topically 2 (two) times daily as needed.   trospium (SANCTURA) 20 MG tablet Take 1 tablet (20 mg total) by mouth daily.   [DISCONTINUED] sertraline (ZOLOFT) 50 MG tablet TAKE ONE-HALF TABLET BY MOUTH ONCE DAILY FOR 7 DAYS. THEN INCREASE TO TAKE ONE TABLET BY MOUTH ONCE DAILY THEREAFTER   [DISCONTINUED] sertraline (ZOLOFT) 50 MG tablet TAKE ONE-HALF TABLET BY MOUTH ONCE DAILY   No facility-administered encounter medications on file as of 02/20/2022.     Lab Results  Component Value Date   WBC 7.4 10/13/2021   HGB 13.0 10/13/2021   HCT 40.0 10/13/2021   PLT 257 10/13/2021   GLUCOSE 97 02/14/2022   CHOL 184 02/14/2022   TRIG 57.0 02/14/2022   HDL 76.20 02/14/2022   LDLCALC 96 02/14/2022   ALT 11 02/14/2022   AST 16 02/14/2022   NA 138 02/14/2022   K 4.3 02/14/2022   CL 102 02/14/2022   CREATININE 0.99 02/14/2022   BUN 12 02/14/2022   CO2 28 02/14/2022   TSH 2.10 10/09/2021   HGBA1C 5.7 02/14/2022    DG Chest 2 View  Result Date: 10/13/2021 CLINICAL DATA:  Chest pain, shortness of breath EXAM: CHEST - 2 VIEW COMPARISON:  09/03/2019 FINDINGS: The heart size and mediastinal contours are within normal limits. Both lungs are clear. There is previous surgical fusion in the lower cervical spine. IMPRESSION: No active cardiopulmonary disease. Electronically Signed   By: Elmer Picker M.D.   On: 10/13/2021 12:33       Assessment & Plan:   Problem List Items Addressed This Visit     Aortic atherosclerosis ([place])    Continue mevacor.        Breast cancer screening   Relevant Orders   MM 3D SCREEN BREAST BILATERAL   Chronic interstitial cystitis    Followed by urology.  On imipramine and sanctura.         Constipation    Minimal  constipation as outlined.  Discussed miralax daily or benefiber daily.  Follow.        Essential hypertension    Intolerant to amlodipine.  On hydralazine.  Follow pressures.  Follow metabolic panel.        GERD (gastroesophageal reflux disease)    No upper symptoms reported.  Continue nexium.        Health care maintenance    Physical today 02/20/22.  S/p hysterectomy.  Mammogram 05/01/21 - Birads I.  Colonoscopy 01/2016 - diverticulosis and non bleeding internal hemorrhoids.         Hypercholesterolemia    Continue mevacor.  Low cholesterol diet and exercise.  Follow lipid panel and liver function tests.        Hyperglycemia    Low  carb diet and exercise. Follow met b and a1c.        Stress    Increased stress.  Continue zoloft.  May be making her tired. Consider decrease to 1/2 tablet q day.        Tremor    Has been evaluated previously by neurology.  MRI without acute changes.  Discussed with her today.  She feels increased stress contributing. Continue zoloft. Discussed follow up with neurology.  Wants to hold.  Will notify me if desires any further evaluation.         Other Visit Diagnoses     Routine general medical examination at a health care facility    -  Primary        Einar Pheasant, MD

## 2022-02-24 ENCOUNTER — Other Ambulatory Visit: Payer: Self-pay | Admitting: Internal Medicine

## 2022-03-02 ENCOUNTER — Encounter: Payer: Self-pay | Admitting: Internal Medicine

## 2022-03-02 NOTE — Assessment & Plan Note (Signed)
No upper symptoms reported.  Continue nexium.  

## 2022-03-02 NOTE — Assessment & Plan Note (Signed)
Has been evaluated previously by neurology.  MRI without acute changes.  Discussed with her today.  She feels increased stress contributing. Continue zoloft. Discussed follow up with neurology.  Wants to hold.  Will notify me if desires any further evaluation.

## 2022-03-02 NOTE — Assessment & Plan Note (Signed)
Intolerant to amlodipine.  On hydralazine.  Follow pressures.  Follow metabolic panel.  

## 2022-03-02 NOTE — Assessment & Plan Note (Signed)
Minimal constipation as outlined.  Discussed miralax daily or benefiber daily.  Follow.

## 2022-03-02 NOTE — Assessment & Plan Note (Signed)
Followed by urology.  On imipramine and sanctura.  

## 2022-03-02 NOTE — Assessment & Plan Note (Signed)
Physical today 02/20/22.  S/p hysterectomy.  Mammogram 05/01/21 - Birads I.  Colonoscopy 01/2016 - diverticulosis and non bleeding internal hemorrhoids.

## 2022-03-02 NOTE — Assessment & Plan Note (Signed)
Continue mevacor °

## 2022-03-02 NOTE — Assessment & Plan Note (Signed)
Increased stress.  Continue zoloft.  May be making her tired. Consider decrease to 1/2 tablet q day.

## 2022-03-02 NOTE — Assessment & Plan Note (Signed)
Continue mevacor.  Low cholesterol diet and exercise.  Follow lipid panel and liver function tests.  

## 2022-03-02 NOTE — Assessment & Plan Note (Signed)
Low carb diet and exercise. Follow met b and a1c.  

## 2022-03-11 ENCOUNTER — Ambulatory Visit: Payer: Self-pay | Admitting: Urology

## 2022-03-20 ENCOUNTER — Ambulatory Visit: Payer: Medicare PPO | Admitting: Urology

## 2022-03-20 ENCOUNTER — Encounter: Payer: Self-pay | Admitting: Urology

## 2022-03-20 ENCOUNTER — Other Ambulatory Visit: Payer: Self-pay | Admitting: Urology

## 2022-03-20 VITALS — BP 166/79 | HR 108 | Ht 64.0 in | Wt 147.0 lb

## 2022-03-20 DIAGNOSIS — N3281 Overactive bladder: Secondary | ICD-10-CM

## 2022-03-20 LAB — BLADDER SCAN AMB NON-IMAGING

## 2022-03-20 NOTE — Progress Notes (Signed)
3  03/20/22 4:02 PM   Rachael Austin Jul 10, 1941 [id number]  Referring provider:  Einar Austin, [address] [place],  [place] [id number] Chief Complaint  Patient presents with   Over Active Bladder    1 year w/PVR      HPI: Rachael Austin is a 81 y.o.female with a personal history of IC and OAB  , who presents today for a 1 year follow-up with PVR   In terms of IC, she is managed by dietary discretion and avoiding triggers as well as imipramine 10 mg.    She has been tried on several medications including Myrbetriq and vesicare which caused itching and more recently trospium.   She is doing well today. She recently had dental work. Her bladder pain is intermittent and it presents as a jabbing pain. She reports itching on every area of her body and it started a year ago and she wonders if this is secondary to trospium.   PVR 334 ml today.  She reports that at home, she empties fine but she was has a "shy bladder" when she comes to urology.  UTIs.   PMH: Past Medical History:  Diagnosis Date   Basal cell carcinoma 06/21/2020   left post shoulder sup, left mid chest, left lat thigh   Basal cell carcinoma 11/07/2021   L upper back, EDC   Basal cell carcinoma 11/07/2021   R upper back, EDC   Basal cell carcinoma 11/27/2021   right upper arm, EDC   Basal cell carcinoma 11/27/2021   right shoulder posterior, EDC   Basal cell carcinoma 11/27/2021   right anterior shoulder, EDC   Basal cell carcinoma (BCC) 06/13/2021   left dorsal foot, EDC 10/02/2021   Basal cell carcinoma (BCC) 06/13/2021   right postauricular tx'd w/ EDC   BCC (basal cell carcinoma of skin) 03/30/2007   R inf med pretibial - BCC   BCC (basal cell carcinoma of skin) 10/12/2013   L nasal ala - BCC   BCC (basal cell carcinoma of skin) 03/10/2018   L mid dorsum med forearm - superficial BCC    BCC (basal cell carcinoma) 10/02/2021   left dorsal foot proximal, EDC 10/02/2021    BCC (basal cell carcinoma) 10/02/2021   left mid back, superficial EDC 11/07/21   BCC (basal cell carcinoma) 10/02/2021   right pretibia   BCC (basal cell carcinoma) 10/02/2021   right mid back, superficial EDC 11/07/21   Breast screening, unspecified 2013   Cancer ([place]) 1992   skin   Degenerative disk disease    GERD (gastroesophageal reflux disease)    History of basal cell carcinoma (BCC) 08/29/2020    left inferior knee lateral, , left inferior knee medial, right pretibia   History of SCC (squamous cell carcinoma) of skin 08/29/2020   right posterior calf, left lateral calf, and    Hypercholesterolemia 2008   Interstitial cystitis    followed by Dr Jacqlyn Larsen   Other sign and symptom in breast 2013   Left upper outer quadrant breast "soreness" Ultrasound exam of right breast in the 2 o'clock position with the breast distracted medially showed a 0.3-0.4 with 0.5 cm simple cyst. In the 1 o'clock position where pt reported tenderness US exam was negatiive.  Because of her history of intermittent nipple drainage, ultrasound was completed of the retroareolar area.   Personal history of tobacco use, presenting hazards to health    PONV (postoperative nausea and vomiting)    SCC (  squamous cell carcinoma) 03/28/2008   R dorsum hand - SCC   SCC (squamous cell carcinoma) 05/09/2009   R lat lower leg - SCCIS   SCC (squamous cell carcinoma) 05/09/2009   L lat lower leg - SCCIS   SCC (squamous cell carcinoma) 04/07/2013   L lower leg - SCCIS   SCC (squamous cell carcinoma) 04/27/2013   R forearm - SCC   SCC (squamous cell carcinoma) 04/28/2017   L lat knee - SCCIS   SCC (squamous cell carcinoma) 05/12/2018   L prox dorsum forearm - SCC   SCC (squamous cell carcinoma) 06/13/2021   left forearm tx'd w/ EDC   SCC (squamous cell carcinoma), leg, right 03/30/2007   R sup pretibial - SCCIS   SCC (squamous cell carcinoma);BCC 10/12/2013   Mid back - superficial BCC with SCCIS    Special screening  for malignant neoplasms, colon    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   left dorsal forearm, left lat calf    Surgical History: Past Surgical History:  Procedure Laterality Date   ABDOMINAL HYSTERECTOMY  1992   APPENDECTOMY     CERVICAL DISCECTOMY     S/P C7-T1 discectomy with fusion   CHOLECYSTECTOMY  1990   COLONOSCOPY WITH PROPOFOL N/A 02/14/2016   Procedure: COLONOSCOPY WITH PROPOFOL;  Surgeon: Lucilla Lame, MD;  Location: [facility];  Service: Endoscopy;  Laterality: N/A;  PT WOULD LIKE 10 ARRIVAL TIME OR LATER   DILATION AND CURETTAGE OF UTERUS     ESOPHAGOGASTRODUODENOSCOPY (EGD) WITH PROPOFOL N/A 02/14/2016   Procedure: ESOPHAGOGASTRODUODENOSCOPY (EGD) WITH PROPOFOL with dialtion;  Surgeon: Lucilla Lame, MD;  Location: [facility];  Service: Endoscopy;  Laterality: N/A;   ESOPHAGOGASTRODUODENOSCOPY (EGD) WITH PROPOFOL N/A 04/13/2019   Procedure: ESOPHAGOGASTRODUODENOSCOPY (EGD) WITH PROPOFOL;  Surgeon: Toledo, Benay Pike, MD;  Location: [facility];  Service: Gastroenterology;  Laterality: N/A;   MELANOMA EXCISION     removed from Left calf 1994    Home Medications:  Allergies as of 03/20/2022       Reactions   Augmentin [amoxicillin-pot Clavulanate] Swelling, Rash   Swelling of the lips   Lexapro [escitalopram Oxalate]    Micardis [telmisartan] Itching   Myrbetriq [mirabegron] Itching   Niacin And Related Itching   Codeine Sulfate Other (See Comments)   "Makes her hyper"   Vesicare [solifenacin Succinate] Nausea And Vomiting, Rash        Medication List        Accurate as of March 20, 2022  4:02 PM. If you have any questions, ask your nurse or doctor.          STOP taking these medications    betamethasone dipropionate 0.05 % cream Stopped by: Hollice Espy, MD   busPIRone 5 MG tablet Commonly known as: BUSPAR Stopped by: Hollice Espy, MD   esomeprazole 40 MG capsule Commonly known as: NexIUM Stopped by: Hollice Espy, MD    mupirocin ointment 2 % Commonly known as: BACTROBAN Stopped by: Hollice Espy, MD       TAKE these medications    acetaminophen 325 MG tablet Commonly known as: TYLENOL Take 650 mg by mouth as needed.   aspirin EC 81 MG tablet Take 1 tablet (81 mg total) by mouth daily. Swallow whole.   hydrALAZINE 25 MG tablet Commonly known as: APRESOLINE Take 1 tablet (25 mg total) by mouth 2 (two) times daily.   ibuprofen 200 MG tablet Commonly known as: ADVIL Take 200 mg by mouth every 6 (six)  hours as needed.   imipramine 10 MG tablet Commonly known as: TOFRANIL Take 1 tablet (10 mg total) by mouth at bedtime.   lovastatin 20 MG tablet Commonly known as: MEVACOR Take 1 tablet (20 mg total) by mouth at bedtime.   nystatin cream Commonly known as: MYCOSTATIN Apply 1 application topically 2 (two) times daily.   sertraline 50 MG tablet Commonly known as: ZOLOFT TAKE ONE-HALF TABLET BY MOUTH ONCE DAILY FOR 7 DAYS. THEN INCREASE TO TAKE ONE TABLET BY MOUTH ONCE DAILY THEREAFTER   triamcinolone cream 0.1 % Commonly known as: KENALOG Apply 1 application topically 2 (two) times daily as needed.   trospium 20 MG tablet Commonly known as: SANCTURA Take 1 tablet (20 mg total) by mouth daily.        Allergies:  Allergies  Allergen Reactions   Augmentin [Amoxicillin-Pot Clavulanate] Swelling and Rash    Swelling of the lips   Lexapro [Escitalopram Oxalate]    Micardis [Telmisartan] Itching   Myrbetriq [Mirabegron] Itching   Niacin And Related Itching   Codeine Sulfate Other (See Comments)    "Makes her hyper"   Vesicare [Solifenacin Succinate] Nausea And Vomiting and Rash    Family History: Family History  Problem Relation Age of Onset   Hodgkin's lymphoma Mother    Heart failure Father    Heart attack Father    Arthritis Sister        Three sisters w/ degeneratve disk disease   Headache Sister        Two sisters hx of headache   Breast cancer Neg Hx    Colon  cancer Neg Hx    Bladder Cancer Neg Hx    Kidney cancer Neg Hx     Social History:  reports that she has quit smoking. Her smoking use included cigarettes. She has a 15.00 pack-year smoking history. She has never used smokeless tobacco. She reports that she does not drink alcohol and does not use drugs.   Physical Exam: BP (!) 166/79   Pulse (!) 108   Ht '5\' 4"'$  (1.626 m)   Wt 147 lb (66.7 kg)   LMP 11/20/1976   BMI 25.23 kg/m   Constitutional:  Alert and oriented, No acute distress. HEENT: [place] AT, moist mucus membranes.  Trachea midline, no masses. Cardiovascular: No clubbing, cyanosis, or edema. Respiratory: Normal respiratory effort, no increased work of breathing. Skin: No rashes, bruises or suspicious lesions. Neurologic: Grossly intact, no focal deficits, moving all 4 extremities. Psychiatric: Normal mood and affect.  Laboratory Data:  Lab Results  Component Value Date   CREATININE 0.99 02/14/2022   Lab Results  Component Value Date   HGBA1C 5.7 02/14/2022    Pertinent Imaging: Results for orders placed or performed in visit on 03/20/22  BLADDER SCAN AMB NON-IMAGING  Result Value Ref Range   Scan Result 3110m      Assessment & Plan:   1. OAB (overactive bladder) - She is not emptying adequately today with a PVR of 334 ml -No significant sequela from this, likely does better at home - BLADDER SCAN AMB NON-IMAGING - trospium (SANCTURA) 20 MG tablet; Take 1 tablet (20 mg total) by mouth daily.  Dispense: 90 tablet; Refill: 3 - imipramine (TOFRANIL) 10 MG tablet; Take 1 tablet (10 mg total) by mouth at bedtime.  Dispense: 90 tablet; Refill: 3   2. Interstitial cystitis - Continue behavioral modification and longstanding history of imipramine 10 mg which is her symptoms well controlled, will continue this medication  3. Itching  Unlikely related to trospium she can try a 1 month holiday from medication.   4.  Incomplete bladder emptying -As  above   Follow-up annually  Conley Rolls as a scribe for Hollice Espy, MD.,have documented all relevant documentation on the behalf of Hollice Espy, MD,as directed by  Hollice Espy, MD while in the presence of Hollice Espy, MD.  I have reviewed the above documentation for accuracy and completeness, and I agree with the above.   Hollice Espy, MD   [facility] [address] [place], [place] [address] [phone]

## 2022-04-10 ENCOUNTER — Other Ambulatory Visit: Payer: Self-pay | Admitting: Urology

## 2022-04-10 DIAGNOSIS — N3281 Overactive bladder: Secondary | ICD-10-CM

## 2022-04-30 ENCOUNTER — Ambulatory Visit
Admission: RE | Admit: 2022-04-30 | Discharge: 2022-04-30 | Disposition: A | Discharge status: Home / Self Care | Payer: Medicare PPO | Source: Ambulatory Visit | Attending: Internal Medicine | Admitting: Internal Medicine

## 2022-04-30 DIAGNOSIS — Z1231 Encounter for screening mammogram for malignant neoplasm of breast: Secondary | ICD-10-CM | POA: Diagnosis not present

## 2022-05-01 ENCOUNTER — Ambulatory Visit: Payer: Medicare PPO | Admitting: Internal Medicine

## 2022-05-01 ENCOUNTER — Encounter: Payer: Self-pay | Admitting: Internal Medicine

## 2022-05-01 DIAGNOSIS — I700 Atherosclerosis of aorta: Secondary | ICD-10-CM | POA: Diagnosis not present

## 2022-05-01 DIAGNOSIS — Z85820 Personal history of malignant melanoma of skin: Secondary | ICD-10-CM

## 2022-05-01 DIAGNOSIS — K219 Gastro-esophageal reflux disease without esophagitis: Secondary | ICD-10-CM

## 2022-05-01 DIAGNOSIS — I10 Essential (primary) hypertension: Secondary | ICD-10-CM | POA: Diagnosis not present

## 2022-05-01 DIAGNOSIS — E7800 Pure hypercholesterolemia, unspecified: Secondary | ICD-10-CM

## 2022-05-01 DIAGNOSIS — N3010 Interstitial cystitis (chronic) without hematuria: Secondary | ICD-10-CM | POA: Diagnosis not present

## 2022-05-01 DIAGNOSIS — F439 Reaction to severe stress, unspecified: Secondary | ICD-10-CM

## 2022-05-01 DIAGNOSIS — R739 Hyperglycemia, unspecified: Secondary | ICD-10-CM | POA: Diagnosis not present

## 2022-05-01 NOTE — Progress Notes (Signed)
[name], female   DOB: 06-20-1941, 81 y.o.   MRN: [id number]   Subjective:    [name], female    DOB: 21-Aug-1941, 81 y.o.   MRN: [id number]   Patient here for a scheduled follow up .   HPI Here to follow up regarding increased stress, blood pressure and cholesterol. Reports increased stress.  Feels tense.  Cannot relax.  On zoloft.  Does not feel is helping.  Tries to stay active.  No chest pain.  Breathing stable.  No increased cough or congestion.  No abdominal pain reported.  Seeing urology for OAB.  On trospium and imipramine (for IC).  Reports nocturia - 2-3x/night.  Also discussed possible sleep apnea.  Will notify me if desires further testing. Not walking as much.  Some left shoulder issues.     Past Medical History:  Diagnosis Date   Basal cell carcinoma 06/21/2020   left post shoulder sup, left mid chest, left lat thigh   Basal cell carcinoma 11/07/2021   L upper back, EDC   Basal cell carcinoma 11/07/2021   R upper back, EDC   Basal cell carcinoma 11/27/2021   right upper arm, EDC   Basal cell carcinoma 11/27/2021   right shoulder posterior, EDC   Basal cell carcinoma 11/27/2021   right anterior shoulder, EDC   Basal cell carcinoma (BCC) 06/13/2021   left dorsal foot, EDC 10/02/2021   Basal cell carcinoma (BCC) 06/13/2021   right postauricular tx'd w/ EDC   BCC (basal cell carcinoma of skin) 03/30/2007   R inf med pretibial - BCC   BCC (basal cell carcinoma of skin) 10/12/2013   L nasal ala - BCC   BCC (basal cell carcinoma of skin) 03/10/2018   L mid dorsum med forearm - superficial BCC    BCC (basal cell carcinoma) 10/02/2021   left dorsal foot proximal, EDC 10/02/2021   BCC (basal cell carcinoma) 10/02/2021   left mid back, superficial EDC 11/07/21   BCC (basal cell carcinoma) 10/02/2021   right pretibia   BCC (basal cell carcinoma) 10/02/2021   right mid back, superficial EDC 11/07/21   Breast screening, unspecified 2013    Cancer ([place]) 1992   skin   Degenerative disk disease    GERD (gastroesophageal reflux disease)    History of basal cell carcinoma (BCC) 08/29/2020    left inferior knee lateral, , left inferior knee medial, right pretibia   History of SCC (squamous cell carcinoma) of skin 08/29/2020   right posterior calf, left lateral calf, and    Hypercholesterolemia 2008   Interstitial cystitis    followed by Dr Jacqlyn Larsen   Other sign and symptom in breast 2013   Left upper outer quadrant breast "soreness" Ultrasound exam of right breast in the 2 o'clock position with the breast distracted medially showed a 0.3-0.4 with 0.5 cm simple cyst. In the 1 o'clock position where pt reported tenderness US exam was negatiive.  Because of her history of intermittent nipple drainage, ultrasound was completed of the retroareolar area.   Personal history of tobacco use, presenting hazards to health    PONV (postoperative nausea and vomiting)    SCC (squamous cell carcinoma) 03/28/2008   R dorsum hand - SCC   SCC (squamous cell carcinoma) 05/09/2009   R lat lower leg - SCCIS   SCC (squamous cell carcinoma) 05/09/2009   L lat lower leg - SCCIS   SCC (squamous cell carcinoma) 04/07/2013   L lower  leg - SCCIS   SCC (squamous cell carcinoma) 04/27/2013   R forearm - SCC   SCC (squamous cell carcinoma) 04/28/2017   L lat knee - SCCIS   SCC (squamous cell carcinoma) 05/12/2018   L prox dorsum forearm - SCC   SCC (squamous cell carcinoma) 06/13/2021   left forearm tx'd w/ EDC   SCC (squamous cell carcinoma), leg, right 03/30/2007   R sup pretibial - SCCIS   SCC (squamous cell carcinoma);BCC 10/12/2013   Mid back - superficial BCC with SCCIS    Special screening for malignant neoplasms, colon    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   left dorsal forearm, left lat calf   Past Surgical History:  Procedure Laterality Date   ABDOMINAL HYSTERECTOMY  1992   APPENDECTOMY     CERVICAL DISCECTOMY     S/P C7-T1  discectomy with fusion   CHOLECYSTECTOMY  1990   COLONOSCOPY WITH PROPOFOL N/A 02/14/2016   Procedure: COLONOSCOPY WITH PROPOFOL;  Surgeon: Lucilla Lame, MD;  Location: [place];  Service: Endoscopy;  Laterality: N/A;  PT WOULD LIKE 10 ARRIVAL TIME OR LATER   DILATION AND CURETTAGE OF UTERUS     ESOPHAGOGASTRODUODENOSCOPY (EGD) WITH PROPOFOL N/A 02/14/2016   Procedure: ESOPHAGOGASTRODUODENOSCOPY (EGD) WITH PROPOFOL with dialtion;  Surgeon: Lucilla Lame, MD;  Location: [place];  Service: Endoscopy;  Laterality: N/A;   ESOPHAGOGASTRODUODENOSCOPY (EGD) WITH PROPOFOL N/A 04/13/2019   Procedure: ESOPHAGOGASTRODUODENOSCOPY (EGD) WITH PROPOFOL;  Surgeon: Toledo, Benay Pike, MD;  Location: [facility];  Service: Gastroenterology;  Laterality: N/A;   MELANOMA EXCISION     removed from Left calf 1994   Family History  Problem Relation Age of Onset   Hodgkin's lymphoma Mother    Heart failure Father    Heart attack Father    Arthritis Sister        Three sisters w/ degeneratve disk disease   Headache Sister        Two sisters hx of headache   Breast cancer Neg Hx    Colon cancer Neg Hx    Bladder Cancer Neg Hx    Kidney cancer Neg Hx    Social History   Socioeconomic History   Marital status: Widowed    Spouse name: Not on file   Number of children: 2   Years of education: Not on file   Highest education level: Not on file  Occupational History   Not on file  Tobacco Use   Smoking status: Former    Packs/day: 1.00    Years: 15.00    Total pack years: 15.00    Types: Cigarettes   Smokeless tobacco: Never  Vaping Use   Vaping Use: Never used  Substance and Sexual Activity   Alcohol use: No    Alcohol/week: 0.0 standard drinks of alcohol   Drug use: No   Sexual activity: Not on file  Other Topics Concern   Not on file  Social History Narrative   Married and has 2 children, daughters.   Social Determinants of Health   Financial Resource Strain: Not on  file  Food Insecurity: Not on file  Transportation Needs: Not on file  Physical Activity: Not on file  Stress: Not on file  Social Connections: Not on file     Review of Systems  Constitutional:  Negative for appetite change and unexpected weight change.  HENT:  Negative for congestion and sinus pressure.   Respiratory:  Negative for cough, chest tightness and shortness of breath.  Cardiovascular:  Negative for chest pain, palpitations and leg swelling.  Gastrointestinal:  Negative for abdominal pain, diarrhea, nausea and vomiting.  Genitourinary:  Negative for difficulty urinating.       Nocturia.   Musculoskeletal:  Negative for joint swelling and myalgias.  Skin:  Negative for color change and rash.  Neurological:  Negative for dizziness, light-headedness and headaches.  Psychiatric/Behavioral:  Negative for agitation and dysphoric mood.        Objective:     BP 132/68 (BP Location: Left Arm, Patient Position: Sitting, Cuff Size: Small)   Pulse 86   Temp 98.5 F (36.9 C) (Temporal)   Resp 17   Ht _0  (1.626 m)   Wt 147 lb 6.4 oz (66.9 kg)   LMP 11/20/1976   SpO2 99%   BMI 25.30 kg/m  Wt Readings from Last 3 Encounters:  05/01/22 147 lb 6.4 oz (66.9 kg)  03/20/22 147 lb (66.7 kg)  02/20/22 146 lb (66.2 kg)    Physical Exam Vitals reviewed.  Constitutional:      General: She is not in acute distress.    Appearance: Normal appearance.  HENT:     Head: Normocephalic and atraumatic.     Right Ear: External ear normal.     Left Ear: External ear normal.  Eyes:     General: No scleral icterus.       Right eye: No discharge.        Left eye: No discharge.     Conjunctiva/sclera: Conjunctivae normal.  Neck:     Thyroid: No thyromegaly.  Cardiovascular:     Rate and Rhythm: Normal rate and regular rhythm.  Pulmonary:     Effort: No respiratory distress.     Breath sounds: Normal breath sounds. No wheezing.  Abdominal:     General: Bowel sounds are normal.      Palpations: Abdomen is soft.     Tenderness: There is no abdominal tenderness.  Musculoskeletal:        General: No swelling or tenderness.     Cervical back: Neck supple. No tenderness.  Lymphadenopathy:     Cervical: No cervical adenopathy.  Skin:    Findings: No erythema or rash.  Neurological:     Mental Status: She is alert.  Psychiatric:        Mood and Affect: Mood normal.        Behavior: Behavior normal.      Outpatient Encounter Medications as of 05/01/2022  Medication Sig   acetaminophen (TYLENOL) 325 MG tablet Take 650 mg by mouth as needed.   hydrALAZINE (APRESOLINE) 25 MG tablet Take 1 tablet (25 mg total) by mouth 2 (two) times daily.   ibuprofen (ADVIL,MOTRIN) 200 MG tablet Take 200 mg by mouth every 6 (six) hours as needed.   imipramine (TOFRANIL) 10 MG tablet TAKE 1 TABLET BY MOUTH AT BEDTIME   lovastatin (MEVACOR) 20 MG tablet Take 1 tablet (20 mg total) by mouth at bedtime.   nystatin cream (MYCOSTATIN) Apply 1 application topically 2 (two) times daily.   triamcinolone cream (KENALOG) 0.1 % Apply 1 application topically 2 (two) times daily as needed.   trospium (SANCTURA) 20 MG tablet Take 1 tablet by mouth once daily   [DISCONTINUED] sertraline (ZOLOFT) 50 MG tablet TAKE ONE-HALF TABLET BY MOUTH ONCE DAILY FOR 7 DAYS. THEN INCREASE TO TAKE ONE TABLET BY MOUTH ONCE DAILY THEREAFTER   aspirin 81 MG EC tablet Take 1 tablet (81 mg total) by mouth daily. Swallow  whole. (Patient not taking: Reported on 05/01/2022)   No facility-administered encounter medications on file as of 05/01/2022.     Lab Results  Component Value Date   WBC 7.4 10/13/2021   HGB 13.0 10/13/2021   HCT 40.0 10/13/2021   PLT 257 10/13/2021   GLUCOSE 97 02/14/2022   CHOL 184 02/14/2022   TRIG 57.0 02/14/2022   HDL 76.20 02/14/2022   LDLCALC 96 02/14/2022   ALT 11 02/14/2022   AST 16 02/14/2022   NA 138 02/14/2022   K 4.3 02/14/2022   CL 102 02/14/2022   CREATININE 0.99 02/14/2022    BUN 12 02/14/2022   CO2 28 02/14/2022   TSH 2.10 10/09/2021   HGBA1C 5.7 02/14/2022       Assessment & Plan:   Problem List Items Addressed This Visit     Aortic atherosclerosis ([place])    Continue mevacor.       Chronic interstitial cystitis    Followed by urology.  On imipramine and sanctura.        Essential hypertension    Intolerant to amlodipine.  On hydralazine.  Follow pressures.  Follow metabolic panel.       Relevant Orders   Basic metabolic panel   GERD (gastroesophageal reflux disease)    No upper symptoms reported.  Continue nexium.       H/O Malignant melanoma    Followed by dermatology.       Hypercholesterolemia    Continue mevacor.  Low cholesterol diet and exercise.  Follow lipid panel and liver function tests.       Relevant Orders   CBC with Differential/Platelet   Hepatic function panel   Lipid panel   TSH   Hyperglycemia    Low carb diet and exercise. Follow met b and a1c.       Relevant Orders   Hemoglobin A1c   Stress    Increased stress.  Fatigue.  Tense. Hard to relax.  Does not feel zoloft is working.  Wants to taper off. Discussed taper.  Discuss other medication options.          Einar Pheasant, MD

## 2022-05-01 NOTE — Patient Instructions (Signed)
Decrease zoloft to 1/2 tablet every other day

## 2022-05-11 ENCOUNTER — Encounter: Payer: Self-pay | Admitting: Internal Medicine

## 2022-05-11 NOTE — Assessment & Plan Note (Signed)
Followed by dermatology

## 2022-05-11 NOTE — Assessment & Plan Note (Signed)
Continue mevacor °

## 2022-05-11 NOTE — Assessment & Plan Note (Signed)
Followed by urology.  On imipramine and sanctura.  

## 2022-05-11 NOTE — Assessment & Plan Note (Signed)
Increased stress.  Fatigue.  Tense. Hard to relax.  Does not feel zoloft is working.  Wants to taper off. Discussed taper.  Discuss other medication options.

## 2022-05-11 NOTE — Assessment & Plan Note (Signed)
Intolerant to amlodipine.  On hydralazine.  Follow pressures.  Follow metabolic panel.  

## 2022-05-11 NOTE — Assessment & Plan Note (Signed)
No upper symptoms reported.  Continue nexium.  

## 2022-05-11 NOTE — Assessment & Plan Note (Signed)
Low carb diet and exercise. Follow met b and a1c.  

## 2022-05-11 NOTE — Assessment & Plan Note (Signed)
Continue mevacor.  Low cholesterol diet and exercise.  Follow lipid panel and liver function tests.  

## 2022-06-04 ENCOUNTER — Encounter: Payer: Medicare PPO | Admitting: Dermatology

## 2022-06-05 ENCOUNTER — Other Ambulatory Visit (INDEPENDENT_AMBULATORY_CARE_PROVIDER_SITE_OTHER): Payer: Medicare PPO

## 2022-06-05 DIAGNOSIS — E7800 Pure hypercholesterolemia, unspecified: Secondary | ICD-10-CM | POA: Diagnosis not present

## 2022-06-05 DIAGNOSIS — I10 Essential (primary) hypertension: Secondary | ICD-10-CM

## 2022-06-05 DIAGNOSIS — R739 Hyperglycemia, unspecified: Secondary | ICD-10-CM | POA: Diagnosis not present

## 2022-06-05 LAB — BASIC METABOLIC PANEL
BUN: 10 mg/dL (ref 6–23)
CO2: 28 mEq/L (ref 19–32)
Calcium: 9.1 mg/dL (ref 8.4–10.5)
Chloride: 103 mEq/L (ref 96–112)
Creatinine, Ser: 1.03 mg/dL (ref 0.40–1.20)
GFR: 50.68 mL/min — ABNORMAL LOW (ref >60.00)
Glucose, Bld: 97 mg/dL (ref 70–99)
Potassium: 4.1 mEq/L (ref 3.5–5.1)
Sodium: 139 mEq/L (ref 135–145)

## 2022-06-05 LAB — CBC WITH DIFFERENTIAL/PLATELET
Basophils Absolute: 0.1 10*3/uL (ref 0.0–0.1)
Basophils Relative: 1.2 % (ref 0.0–3.0)
Eosinophils Absolute: 0.1 10*3/uL (ref 0.0–0.7)
Eosinophils Relative: 1.2 % (ref 0.0–5.0)
HCT: 38.7 % (ref 36.0–46.0)
Hemoglobin: 12.9 g/dL (ref 12.0–15.0)
Lymphocytes Relative: 21.6 % (ref 12.0–46.0)
Lymphs Abs: 1 10*3/uL (ref 0.7–4.0)
MCHC: 33.3 g/dL (ref 30.0–36.0)
MCV: 88.7 fl (ref 78.0–100.0)
Monocytes Absolute: 0.4 10*3/uL (ref 0.1–1.0)
Monocytes Relative: 9.1 % (ref 3.0–12.0)
Neutro Abs: 3.2 10*3/uL (ref 1.4–7.7)
Neutrophils Relative %: 66.9 % (ref 43.0–77.0)
Platelets: 227 10*3/uL (ref 150.0–400.0)
RBC: 4.37 Mil/uL (ref 3.87–5.11)
RDW: 15 % (ref 11.5–15.5)
WBC: 4.9 10*3/uL (ref 4.0–10.5)

## 2022-06-05 LAB — LIPID PANEL
Cholesterol: 173 mg/dL (ref 0–200)
HDL: 76.6 mg/dL (ref >39.00)
LDL Cholesterol: 84 mg/dL (ref 0–99)
NonHDL: 96.73
Total CHOL/HDL Ratio: 2
Triglycerides: 62 mg/dL (ref 0.0–149.0)
VLDL: 12.4 mg/dL (ref 0.0–40.0)

## 2022-06-05 LAB — HEPATIC FUNCTION PANEL
ALT: 10 U/L (ref 0–35)
AST: 16 U/L (ref 0–37)
Albumin: 4.2 g/dL (ref 3.5–5.2)
Alkaline Phosphatase: 72 U/L (ref 39–117)
Bilirubin, Direct: 0.2 mg/dL (ref 0.0–0.3)
Total Bilirubin: 0.7 mg/dL (ref 0.2–1.2)
Total Protein: 6.5 g/dL (ref 6.0–8.3)

## 2022-06-05 LAB — TSH: TSH: 5.42 u[IU]/mL (ref 0.35–5.50)

## 2022-06-05 LAB — HEMOGLOBIN A1C: Hgb A1c MFr Bld: 5.9 % (ref 4.6–6.5)

## 2022-06-06 ENCOUNTER — Telehealth: Payer: Self-pay

## 2022-06-06 NOTE — Telephone Encounter (Signed)
Lvm for pt to return call in regards to lab results.  Per Dr.Scott: Notify - cholesterol levels have improves some from last check and are ok.  Overall sugar control relatively stable.  Hgb, thyroid test and liver function tests are wnl.  Also, we had discussed during last visit - tapering off zoloft. She did not feel was helping.  Please confirm if tapered off and see how she is doing.  Does she feel needs something more to help "level things out"

## 2022-06-09 NOTE — Telephone Encounter (Signed)
Called pt x2 lvm x2 for pt to return call in regards to lab results.  Per Dr.Scott: Notify - cholesterol levels have improves some from last check and are ok.  Overall sugar control relatively stable.  Hgb, thyroid test and liver function tests are wnl.  Also, we had discussed during last visit - tapering off zoloft. She did not feel was helping.  Please confirm if tapered off and see how she is doing.  Does she feel needs something more to help "level things out"

## 2022-06-10 NOTE — Telephone Encounter (Signed)
Called pt x3 lvm x3 for pt to return call in regards to lab results.  Letter created & sent to mail for pt to call ofc to disc labs.

## 2022-06-12 NOTE — Telephone Encounter (Signed)
Patient's daughter, Rachael Austin, called with patient to state they are returning our call.  Patient states she is tapering off her zoloft, and is now taking half a tablet every other night.  Patient states she does feel like she may need something to help "level things out", but she would like to wait until she gets to speak with Dr. Einar Pheasant on 06/19/2022.  Rachael Austin also states they have a question regarding the decreasing levels for GFR and would like for [place] to call them about this.

## 2022-06-13 NOTE — Telephone Encounter (Signed)
Per note, discuss with pt at upcoming appt

## 2022-06-16 ENCOUNTER — Telehealth: Payer: Self-pay

## 2022-06-16 ENCOUNTER — Other Ambulatory Visit: Payer: Medicare PPO

## 2022-06-16 NOTE — Telephone Encounter (Signed)
Patient's daughter, Ceasar Lund, states patient needs a refill for her Esomeprazole (40 MG).  Sharee Pimple states she tried to do this online, but the medication is not listed.  Sharee Pimple states the patient's preferred pharmacy is [facility] on [address].

## 2022-06-16 NOTE — Telephone Encounter (Signed)
Called and left message for Rachael Austin to clarify that her mother has been taking nexium and no problems taking.  If this is correct, ok to refill.

## 2022-06-17 NOTE — Telephone Encounter (Signed)
Lvm for pt's daughter Sharee Pimple to return call to clarify pt indeed taking nexium in order to proceed with RF. See note.

## 2022-06-19 ENCOUNTER — Other Ambulatory Visit: Payer: Medicare PPO

## 2022-06-19 ENCOUNTER — Ambulatory Visit (INDEPENDENT_AMBULATORY_CARE_PROVIDER_SITE_OTHER): Payer: Medicare PPO | Admitting: Internal Medicine

## 2022-06-19 ENCOUNTER — Encounter: Payer: Self-pay | Admitting: Internal Medicine

## 2022-06-19 DIAGNOSIS — N3010 Interstitial cystitis (chronic) without hematuria: Secondary | ICD-10-CM

## 2022-06-19 DIAGNOSIS — E7800 Pure hypercholesterolemia, unspecified: Secondary | ICD-10-CM | POA: Diagnosis not present

## 2022-06-19 DIAGNOSIS — R739 Hyperglycemia, unspecified: Secondary | ICD-10-CM | POA: Diagnosis not present

## 2022-06-19 DIAGNOSIS — I10 Essential (primary) hypertension: Secondary | ICD-10-CM

## 2022-06-19 DIAGNOSIS — F439 Reaction to severe stress, unspecified: Secondary | ICD-10-CM

## 2022-06-19 DIAGNOSIS — I700 Atherosclerosis of aorta: Secondary | ICD-10-CM

## 2022-06-19 DIAGNOSIS — W19XXXA Unspecified fall, initial encounter: Secondary | ICD-10-CM

## 2022-06-19 DIAGNOSIS — K219 Gastro-esophageal reflux disease without esophagitis: Secondary | ICD-10-CM

## 2022-06-19 MED ORDER — ESOMEPRAZOLE MAGNESIUM 40 MG PO CPDR: 40.0000 mg | DELAYED_RELEASE_CAPSULE | Freq: Every day | ORAL | 3 refills | Status: DC

## 2022-06-19 NOTE — Progress Notes (Signed)
[name], female   DOB: [birth year], 81 y.o.   MRN: [id number]   Virtual Visit via telephone Note   All issues noted in this document were discussed and addressed.  No physical exam was performed (except for noted visual exam findings with Video Visits).   I connected with Marland Kitchen by telephone and verified that I am speaking with the correct person using two identifiers. Location patient: home Location provider: work  Persons participating in the telephone visit: patient, provider  The limitations, risks, security and privacy concerns of performing an evaluation and management service by telephone and the availability of in person appointments have been discussed.  It has also been discussed with the patient that there may be a patient responsible charge related to this service. The patient expressed understanding and agreed to proceed.   Reason for visit: follow up appt  HPI: Tripped - fell.  Approximately two weeks ago - up to bathroom.  Forgot where she was.  Stumbled - floor slick.  Slid down side of bed.  Aggravated left arm and shoulder. Took ibuprofen.  Better.  No chest pain reported.  Breathing stable.  On nexium.  No abdominal pain. Off zoloft.  Itching better.  Blood pressrue - 132/70s.     ROS: See pertinent positives and negatives per HPI.  Past Medical History:  Diagnosis Date   Basal cell carcinoma 06/21/2020   left post shoulder sup, left mid chest, left lat thigh   Basal cell carcinoma 11/07/2021   L upper back, EDC   Basal cell carcinoma 11/07/2021   R upper back, EDC   Basal cell carcinoma 11/27/2021   right upper arm, EDC   Basal cell carcinoma 11/27/2021   right shoulder posterior, EDC   Basal cell carcinoma 11/27/2021   right anterior shoulder, EDC   Basal cell carcinoma (BCC) 06/13/2021   left dorsal foot, EDC 10/02/2021   Basal cell carcinoma (BCC) 06/13/2021   right postauricular tx'd w/ EDC   BCC (basal cell carcinoma of  skin) 03/30/2007   R inf med pretibial - BCC   BCC (basal cell carcinoma of skin) 10/12/2013   L nasal ala - BCC   BCC (basal cell carcinoma of skin) 03/10/2018   L mid dorsum med forearm - superficial BCC    BCC (basal cell carcinoma) 10/02/2021   left dorsal foot proximal, EDC 10/02/2021   BCC (basal cell carcinoma) 10/02/2021   left mid back, superficial EDC 11/07/21   BCC (basal cell carcinoma) 10/02/2021   right pretibia   BCC (basal cell carcinoma) 10/02/2021   right mid back, superficial EDC 11/07/21   Breast screening, unspecified 2013   Cancer ([place]) 1992   skin   Degenerative disk disease    GERD (gastroesophageal reflux disease)    History of basal cell carcinoma (BCC) 08/29/2020    left inferior knee lateral, , left inferior knee medial, right pretibia   History of SCC (squamous cell carcinoma) of skin 08/29/2020   right posterior calf, left lateral calf, and    Hypercholesterolemia 2008   Interstitial cystitis    followed by Dr Jacqlyn Larsen   Other sign and symptom in breast 2013   Left upper outer quadrant breast "soreness" Ultrasound exam of right breast in the 2 o'clock position with the breast distracted medially showed a 0.3-0.4 with 0.5 cm simple cyst. In the 1 o'clock position where pt reported tenderness US exam was negatiive.  Because of her history of intermittent nipple drainage, ultrasound  was completed of the retroareolar area.   Personal history of tobacco use, presenting hazards to health    PONV (postoperative nausea and vomiting)    SCC (squamous cell carcinoma) 03/28/2008   R dorsum hand - SCC   SCC (squamous cell carcinoma) 05/09/2009   R lat lower leg - SCCIS   SCC (squamous cell carcinoma) 05/09/2009   L lat lower leg - SCCIS   SCC (squamous cell carcinoma) 04/07/2013   L lower leg - SCCIS   SCC (squamous cell carcinoma) 04/27/2013   R forearm - SCC   SCC (squamous cell carcinoma) 04/28/2017   L lat knee - SCCIS   SCC (squamous cell carcinoma) 05/12/2018    L prox dorsum forearm - SCC   SCC (squamous cell carcinoma) 06/13/2021   left forearm tx'd w/ EDC   SCC (squamous cell carcinoma), leg, right 03/30/2007   R sup pretibial - SCCIS   SCC (squamous cell carcinoma);BCC 10/12/2013   Mid back - superficial BCC with SCCIS    Special screening for malignant neoplasms, colon    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   left dorsal forearm, left lat calf    Past Surgical History:  Procedure Laterality Date   ABDOMINAL HYSTERECTOMY  1992   APPENDECTOMY     CERVICAL DISCECTOMY     S/P C7-T1 discectomy with fusion   CHOLECYSTECTOMY  1990   COLONOSCOPY WITH PROPOFOL N/A 02/14/2016   Procedure: COLONOSCOPY WITH PROPOFOL;  Surgeon: Lucilla Lame, MD;  Location: [place];  Service: Endoscopy;  Laterality: N/A;  PT WOULD LIKE 10 ARRIVAL TIME OR LATER   DILATION AND CURETTAGE OF UTERUS     ESOPHAGOGASTRODUODENOSCOPY (EGD) WITH PROPOFOL N/A 02/14/2016   Procedure: ESOPHAGOGASTRODUODENOSCOPY (EGD) WITH PROPOFOL with dialtion;  Surgeon: Lucilla Lame, MD;  Location: [place];  Service: Endoscopy;  Laterality: N/A;   ESOPHAGOGASTRODUODENOSCOPY (EGD) WITH PROPOFOL N/A 04/13/2019   Procedure: ESOPHAGOGASTRODUODENOSCOPY (EGD) WITH PROPOFOL;  Surgeon: Toledo, Benay Pike, MD;  Location: [facility];  Service: Gastroenterology;  Laterality: N/A;   MELANOMA EXCISION     removed from Left calf 1994    Family History  Problem Relation Age of Onset   Hodgkin's lymphoma Mother    Heart failure Father    Heart attack Father    Arthritis Sister        Three sisters w/ degeneratve disk disease   Headache Sister        Two sisters hx of headache   Breast cancer Neg Hx    Colon cancer Neg Hx    Bladder Cancer Neg Hx    Kidney cancer Neg Hx     SOCIAL HX: reviewed.    Current Outpatient Medications:    acetaminophen (TYLENOL) 325 MG tablet, Take 650 mg by mouth as needed., Disp: , Rfl:    esomeprazole (NEXIUM) 40 MG capsule, Take 1  capsule (40 mg total) by mouth daily., Disp: 30 capsule, Rfl: 3   hydrALAZINE (APRESOLINE) 25 MG tablet, Take 1 tablet (25 mg total) by mouth 2 (two) times daily., Disp: 180 tablet, Rfl: 3   ibuprofen (ADVIL,MOTRIN) 200 MG tablet, Take 200 mg by mouth every 6 (six) hours as needed., Disp: , Rfl:    imipramine (TOFRANIL) 10 MG tablet, TAKE 1 TABLET BY MOUTH AT BEDTIME, Disp: 90 tablet, Rfl: 0   lovastatin (MEVACOR) 20 MG tablet, Take 1 tablet (20 mg total) by mouth at bedtime., Disp: 90 tablet, Rfl: 1   nystatin cream (MYCOSTATIN), Apply 1 application  topically 2 (two) times daily., Disp: 30 g, Rfl: 0   triamcinolone cream (KENALOG) 0.1 %, Apply 1 application topically 2 (two) times daily as needed., Disp: 80 g, Rfl: 2   trospium (SANCTURA) 20 MG tablet, Take 1 tablet by mouth once daily, Disp: 90 tablet, Rfl: 0   traMADol (ULTRAM) 50 MG tablet, Take 1 tablet (50 mg total) by mouth every 12 (twelve) hours as needed for up to 5 days., Disp: 10 tablet, Rfl: 0  EXAM:  GENERAL: alert. Sounds to be in no acute distress.  Answering questions appropriately.   PSYCH/NEURO: pleasant and cooperative, no obvious depression or anxiety, speech and thought processing grossly intact  ASSESSMENT AND PLAN:  Discussed the following assessment and plan:  Problem List Items Addressed This Visit     Aortic atherosclerosis ([place])    Continue mevacor.       Chronic interstitial cystitis    Followed by urology.  On imipramine and sanctura.        Essential hypertension    Intolerant to amlodipine.  On hydralazine.  Follow pressures.  Follow metabolic panel.       Fall    Recent fall.  Slid onto floor.  No head injury.  States is better.  Follow.       GERD (gastroesophageal reflux disease)    No upper symptoms reported.  Continue nexium.       Relevant Medications   esomeprazole (NEXIUM) 40 MG capsule   Hypercholesterolemia    Continue mevacor.  Low cholesterol diet and exercise.  Follow lipid  panel and liver function tests.       Hyperglycemia    Low carb diet and exercise. Follow met b and a1c.       Stress    Discussed.  Overall appears to be doing better.  Desires no medication at this time.  Follow.  Itching better        Return if symptoms worsen or fail to improve, for keep scheduled.   I discussed the assessment and treatment plan with the patient. The patient was provided an opportunity to ask questions and all were answered. The patient agreed with the plan and demonstrated an understanding of the instructions.   The patient was advised to call back or seek an in-person evaluation if the symptoms worsen or if the condition fails to improve as anticipated.  I provided 23 minutes of non-face-to-face time during this encounter.   Einar Pheasant, MD

## 2022-06-23 NOTE — Telephone Encounter (Signed)
RF by PCP on 06/19/22.

## 2022-06-26 ENCOUNTER — Telehealth: Payer: Self-pay | Admitting: Internal Medicine

## 2022-06-26 ENCOUNTER — Ambulatory Visit: Payer: Medicare PPO | Admitting: Internal Medicine

## 2022-06-26 ENCOUNTER — Encounter: Payer: Self-pay | Admitting: Internal Medicine

## 2022-06-26 ENCOUNTER — Ambulatory Visit (INDEPENDENT_AMBULATORY_CARE_PROVIDER_SITE_OTHER): Payer: Medicare PPO

## 2022-06-26 VITALS — BP 136/60 | HR 88 | Temp 98.1°F | Ht 64.0 in | Wt 150.2 lb

## 2022-06-26 DIAGNOSIS — R0781 Pleurodynia: Secondary | ICD-10-CM

## 2022-06-26 DIAGNOSIS — N3281 Overactive bladder: Secondary | ICD-10-CM

## 2022-06-26 DIAGNOSIS — M47814 Spondylosis without myelopathy or radiculopathy, thoracic region: Secondary | ICD-10-CM

## 2022-06-26 DIAGNOSIS — M546 Pain in thoracic spine: Secondary | ICD-10-CM | POA: Diagnosis not present

## 2022-06-26 DIAGNOSIS — M810 Age-related osteoporosis without current pathological fracture: Secondary | ICD-10-CM | POA: Diagnosis not present

## 2022-06-26 DIAGNOSIS — M549 Dorsalgia, unspecified: Secondary | ICD-10-CM

## 2022-06-26 DIAGNOSIS — S2242XA Multiple fractures of ribs, left side, initial encounter for closed fracture: Secondary | ICD-10-CM

## 2022-06-26 DIAGNOSIS — R269 Unspecified abnormalities of gait and mobility: Secondary | ICD-10-CM | POA: Diagnosis not present

## 2022-06-26 DIAGNOSIS — Z9181 History of falling: Secondary | ICD-10-CM | POA: Diagnosis not present

## 2022-06-26 DIAGNOSIS — E559 Vitamin D deficiency, unspecified: Secondary | ICD-10-CM

## 2022-06-26 DIAGNOSIS — I700 Atherosclerosis of aorta: Secondary | ICD-10-CM

## 2022-06-26 MED ORDER — TRAMADOL HCL 50 MG PO TABS: 50.0000 mg | ORAL_TABLET | Freq: Two times a day (BID) | ORAL | 0 refills | Status: AC | PRN

## 2022-06-26 NOTE — Telephone Encounter (Signed)
I called and scheduled the patient to see Dr. Olivia Mackie Mclean-Scoccuzza today for evaluation from a fall and rib pain.  ,cma

## 2022-06-26 NOTE — Patient Instructions (Addendum)
Denosumab Injection (Osteoporosis) What is this medication? DENOSUMAB (den oh SUE mab) prevents and treats osteoporosis. It works by Paramedic stronger and less likely to break (fracture). It is a monoclonal antibody. This medicine may be used for other purposes; ask your health care provider or pharmacist if you have questions. COMMON BRAND NAME(S): Prolia What should I tell my care team before I take this medication? They need to know if you have any of these conditions: Dental or gum disease, or plan to have dental surgery or a tooth pulled Infection Kidney disease Low levels of calcium or vitamin D in your blood On dialysis Poor nutrition Skin conditions Thyroid disease, or have had thyroid or parathyroid surgery Trouble absorbing minerals in your stomach or intestine An unusual reaction to denosumab, other medications, foods, dyes, or preservatives Pregnant or trying to get pregnant Breast-feeding How should I use this medication? This medication is injected under the skin. It is given by your care team in a hospital or clinic setting. A special MedGuide will be given to you before each treatment. Be sure to read this information carefully each time. Talk to your care team about the use of this medication in children. Special care may be needed. Overdosage: If you think you have taken too much of this medicine contact a poison control center or emergency room at once. NOTE: This medicine is only for you. Do not share this medicine with others. What if I miss a dose? Keep appointments for follow-up doses. It is important not to miss your dose. Call your care team if you are unable to keep an appointment. What may interact with this medication? Do not take this medication with any of the following: Other medications that contain denosumab This medication may also interact with the following: Medications that lower your chance of fighting infection Steroid medications,  such as prednisone or cortisone This list may not describe all possible interactions. Give your health care provider a list of all the medicines, herbs, non-prescription drugs, or dietary supplements you use. Also tell them if you smoke, drink alcohol, or use illegal drugs. Some items may interact with your medicine. What should I watch for while using this medication? Your condition will be monitored carefully while you are receiving this medication. You may need blood work while taking this medication. This medication may increase your risk of getting an infection. Call your care team for advice if you get a fever, chills, sore throat, or other symptoms of a cold or flu. Do not treat yourself. Try to avoid being around people who are sick. Tell your dentist and dental surgeon that you are taking this medication. You should not have major dental surgery while on this medication. See your dentist to have a dental exam and fix any dental problems before starting this medication. Take good care of your teeth while on this medication. Make sure you see your dentist for regular follow-up appointments. You should make sure you get enough calcium and vitamin D while you are taking this medication. Discuss the foods you eat and the vitamins you take with your care team. Talk to your care team if you are pregnant or think you might be pregnant. This medication can cause serious birth defects if taken during pregnancy and for 5 months after the last dose. You will need a negative pregnancy test before starting this medication. Contraception is recommended while taking this medication and for 5 months after the last dose. Your care team can  help you find the option that works for you. Talk to your care team before breastfeeding. Changes to your treatment plan may be needed. What side effects may I notice from receiving this medication? Side effects that you should report to your care team as soon as  possible: Allergic reactions--skin rash, itching, hives, swelling of the face, lips, tongue, or throat Infection--fever, chills, cough, sore throat, wounds that don't heal, pain or trouble when passing urine, general feeling of discomfort or being unwell Low calcium level--muscle pain or cramps, confusion, tingling, or numbness in the hands or feet Osteonecrosis of the jaw--pain, swelling, or redness in the mouth, numbness of the jaw, poor healing after dental work, unusual discharge from the mouth, visible bones in the mouth Severe bone, joint, or muscle pain Skin infection--skin redness, swelling, warmth, or pain Side effects that usually do not require medical attention (report these to your care team if they continue or are bothersome): Back pain Headache Joint pain Muscle pain Pain in the hands, arms, legs, or feet Runny or stuffy nose Sore throat This list may not describe all possible side effects. Call your doctor for medical advice about side effects. You may report side effects to FDA at 1-800-FDA-1088. Where should I keep my medication? This medication is given in a hospital or clinic. It will not be stored at home. NOTE: This sheet is a summary. It may not cover all possible information. If you have questions about this medicine, talk to your doctor, pharmacist, or health care provider.  2023 Elsevier/Gold Standard (2022-01-27 00:00:00)  [facility]  Phone Fax E-mail Address  [phone] Not available [address]   [address]   [place] [address]     Specialties     Otolaryngology       Tylenol arthritis  Tramadol 1x per day 2x per day  Lidocaine pain patch over the counter   If constipation Miralax 1-2 caps daily with colace 100-200 mg daily  Warm prune juice    Osteoporosis  Osteoporosis happens when the bones become thin and less dense than normal. Osteoporosis makes bones more brittle and fragile and more likely to break  (fracture). Over time, osteoporosis can cause your bones to become so weak that they fracture after a minor fall. Bones in the hip, wrist, and spine are most likely to fracture due to osteoporosis. What are the causes? The exact cause of this condition is not known. What increases the risk? You are more likely to develop this condition if you: Have family members with this condition. Have poor nutrition. Use the following: Steroid medicines, such as prednisone. Anti-seizure medicines. Nicotine or tobacco, such as cigarettes, e-cigarettes, and chewing tobacco. Are female. Are age 40 or older. Are not physically active (are sedentary). Are of European or Asian descent. Have a small body frame. What are the signs or symptoms? A fracture might be the first sign of osteoporosis, especially if the fracture results from a fall or injury that usually would not cause a bone to break. Other signs and symptoms include: Pain in the neck or low back. Stooped posture. Loss of height. How is this diagnosed? This condition may be diagnosed based on: Your medical history. A physical exam. A bone mineral density test, also called a DXA or DEXA test (dual-energy X-ray absorptiometry test). This test uses X-rays to measure the amount of minerals in your bones. How is this treated? This condition may be treated by: Making lifestyle changes, such as: Including  foods with more calcium and vitamin D in your diet. Doing weight-bearing and muscle-strengthening exercises. Stopping tobacco use. Limiting alcohol intake. Taking medicine to slow the process of bone loss or to increase bone density. Taking daily supplements of calcium and vitamin D. Taking hormone replacement medicines, such as estrogen for women and testosterone for men. Monitoring your levels of calcium and vitamin D. The goal of treatment is to strengthen your bones and lower your risk for a fracture. Follow these instructions at  home: Eating and drinking Include calcium and vitamin D in your diet. Calcium is important for bone health, and vitamin D helps your body absorb calcium. Good sources of calcium and vitamin D include: Certain fatty fish, such as salmon and tuna. Products that have calcium and vitamin D added to them (are fortified), such as fortified cereals. Egg yolks. Cheese. Liver.  Activity Do exercises as told by your health care provider. Ask your health care provider what exercises and activities are safe for you. You should do: Exercises that make you work against gravity (weight-bearing exercises), such as tai chi, yoga, or walking. Exercises to strengthen muscles, such as lifting weights. Lifestyle Do not drink alcohol if: Your health care provider tells you not to drink. You are pregnant, may be pregnant, or are planning to become pregnant. If you drink alcohol: Limit how much you use to: 0-1 drink a day for women. 0-2 drinks a day for men. Know how much alcohol is in your drink. In the [place] one drink equals one 12 oz bottle of beer (355 mL), one 5 oz glass of wine (148 mL), or one 1 oz glass of hard liquor (44 mL). Do not use any products that contain nicotine or tobacco, such as cigarettes, e-cigarettes, and chewing tobacco. If you need help quitting, ask your health care provider. Preventing falls Use devices to help you move around (mobility aids) as needed, such as canes, walkers, scooters, or crutches. Keep rooms well-lit and clutter-free. Remove tripping hazards from walkways, including cords and throw rugs. Install grab bars in bathrooms and safety rails on stairs. Use rubber mats in the bathroom and other areas that are often wet or slippery. Wear closed-toe shoes that fit well and support your feet. Wear shoes that have rubber soles or low heels. Review your medicines with your health care provider. Some medicines can cause dizziness or changes in blood pressure, which can  increase your risk of falling. General instructions Take over-the-counter and prescription medicines only as told by your health care provider. Keep all follow-up visits. This is important. Contact a health care provider if: You have never been screened for osteoporosis and you are: A woman who is age 11 or older. A man who is age 7 or older. Get help right away if: You fall or injure yourself. Summary Osteoporosis is thinning and loss of density in your bones. This makes bones more brittle and fragile and more likely to break (fracture),even with minor falls. The goal of treatment is to strengthen your bones and lower your risk for a fracture. Include calcium and vitamin D in your diet. Calcium is important for bone health, and vitamin D helps your body absorb calcium. Talk with your health care provider about screening for osteoporosis if you are a woman who is age 75 or older, or a man who is age 43 or older. This information is not intended to replace advice given to you by your health care provider. Make sure you discuss any questions  you have with your health care provider. Document Revised: 03/01/2020 Document Reviewed: 03/01/2020 Elsevier Patient Education  [place].  Rib Fracture  A rib fracture is a break or crack in one of the bones of the ribs. The ribs are long, curved bones that wrap around your chest and attach to your spine and your breastbone. The ribs protect your heart, lungs, and other organs in the chest. A broken or cracked rib is often painful but is not usually serious. Most rib fractures heal on their own over time. However, rib fractures can be more serious if multiple ribs are broken or if broken ribs move out of place and push against other structures or organs. What are the causes? This condition is caused by: Repetitive movements with high force, such as pitching a baseball or having very bad coughing spells. A direct hit the chest, such as a sports  injury, a car crash, or a fall. Cancer that has spread to the bones, which can weaken bones and cause them to break. What are the signs or symptoms? Symptoms of this condition include: Pain when you breathe in or cough. Pain when someone presses on the injured area. Feeling short of breath. How is this diagnosed? This condition is diagnosed with a physical exam and medical history. You may also have imaging tests, such as: Chest X-ray. CT scan. MRI. Bone scan. Chest ultrasound. How is this treated? Treatment for this condition depends on the severity of the fracture. Most rib fractures usually Severe injuries may require hospitalization or surgery. Follow these instructions at home: Managing pain, stiffness, and swelling If directed, put ice on the injured area. To do this: Put ice in a plastic bag. Place a towel between your skin and the bag. Leave the ice on for 20 minutes, 2-3 times a day. Remove the ice if your skin turns bright red. This is very important. If you cannot feel pain, heat, or cold, you have a greater risk of damage to the area. Take over-the-counter and prescription medicines only as told by your health care provider. Activity Avoid doing activities or movements that cause pain. Be careful during activities and avoid bumping the injured rib. Slowly increase your activity as told by your health care provider. General instructions Do deep breathing exercises as told by your health care provider. This helps prevent pneumonia, which is a common complication of a broken rib. Your health care provider may instruct you to: Take deep breaths several times a day. Try to cough several times a day, holding a pillow against the injured area. Use a device called incentive spirometer to practice deep breathing several times a day. Drink enough fluid to keep your urine pale yellow. Do not wear a rib belt or binder. These restrict breathing, which can lead to pneumonia. Keep all  follow-up visits. This is important. Contact a health care provider if: You have a fever. Get help right away if: You have difficulty breathing or you are short of breath. You develop a cough that does not stop, or you cough up thick or bloody sputum. You have nausea, vomiting, or pain in your abdomen. Your pain gets worse and medicine does not help. These symptoms may represent a serious problem that is an emergency. Do not wait to see if the symptoms will go away. Get medical help right away. Call your local emergency services (911 in the [place] Do not drive yourself to the hospital. Summary A rib fracture is a break or crack  in one of the bones of the ribs. A broken or cracked rib is often painful but is not usually serious. Most rib fractures heal on their own over time. Treatment for this condition depends on the severity of the fracture. Avoid doing any activities or movements that cause pain. This information is not intended to replace advice given to you by your health care provider. Make sure you discuss any questions you have with your health care provider. Document Revised: 01/06/2020 Document Reviewed: 01/06/2020 Elsevier Patient Education  [place].

## 2022-06-26 NOTE — Telephone Encounter (Signed)
Patient spoke to Dr Nicki Reaper last week about falling. Patient calls today complaining that her ribs are still hurting and nothing over the counter is really working. She was wondering if she might have a crack or broken rib.

## 2022-06-26 NOTE — Progress Notes (Signed)
Chief Complaint  Patient presents with   Rib Injury    Pt fell on her left side 2 weeks ago and still feeling pain pain level is at a 8-9   Acute visit with oldest daughter   Fall x 2  2 weeks ago in [place] at daughters place she was getting up to use the restroom due to overactive bladder at night and fell out of bed and hit her rib/back on the wooden bed frame 4/10 mid back and left rib pain x 2 weeks nothing tried other than otc nsaids.   She also w/in the same 2 week period was sitting on a bench with a small kid and missed the bench falling backwards onto her back but did not hit her head and this happened at the beat as well      Review of Systems  Constitutional:  Negative for weight loss.  HENT:  Negative for hearing loss.   Eyes:  Negative for blurred vision.  Respiratory:  Negative for shortness of breath.   Cardiovascular:  Negative for chest pain.  Gastrointestinal:  Negative for abdominal pain and blood in stool.  Genitourinary:  Negative for dysuria.  Musculoskeletal:  Positive for back pain, falls and joint pain.  Skin:  Negative for rash.  Neurological:  Negative for headaches.  Psychiatric/Behavioral:  Negative for depression.    Past Medical History:  Diagnosis Date   Basal cell carcinoma 06/21/2020   left post shoulder sup, left mid chest, left lat thigh   Basal cell carcinoma 11/07/2021   L upper back, EDC   Basal cell carcinoma 11/07/2021   R upper back, EDC   Basal cell carcinoma 11/27/2021   right upper arm, EDC   Basal cell carcinoma 11/27/2021   right shoulder posterior, EDC   Basal cell carcinoma 11/27/2021   right anterior shoulder, EDC   Basal cell carcinoma (BCC) 06/13/2021   left dorsal foot, EDC 10/02/2021   Basal cell carcinoma (BCC) 06/13/2021   right postauricular tx'd w/ EDC   BCC (basal cell carcinoma of skin) 03/30/2007   R inf med pretibial - BCC   BCC (basal cell carcinoma of skin) 10/12/2013   L nasal ala - BCC   BCC (basal  cell carcinoma of skin) 03/10/2018   L mid dorsum med forearm - superficial BCC    BCC (basal cell carcinoma) 10/02/2021   left dorsal foot proximal, EDC 10/02/2021   BCC (basal cell carcinoma) 10/02/2021   left mid back, superficial EDC 11/07/21   BCC (basal cell carcinoma) 10/02/2021   right pretibia   BCC (basal cell carcinoma) 10/02/2021   right mid back, superficial EDC 11/07/21   Breast screening, unspecified 2013   Cancer ([place]) 1992   skin   Degenerative disk disease    GERD (gastroesophageal reflux disease)    History of basal cell carcinoma (BCC) 08/29/2020    left inferior knee lateral, , left inferior knee medial, right pretibia   History of SCC (squamous cell carcinoma) of skin 08/29/2020   right posterior calf, left lateral calf, and    Hypercholesterolemia 2008   Interstitial cystitis    followed by Dr Jacqlyn Larsen   Other sign and symptom in breast 2013   Left upper outer quadrant breast "soreness" Ultrasound exam of right breast in the 2 o'clock position with the breast distracted medially showed a 0.3-0.4 with 0.5 cm simple cyst. In the 1 o'clock position where pt reported tenderness US exam was negatiive.  Because of her history  of intermittent nipple drainage, ultrasound was completed of the retroareolar area.   Personal history of tobacco use, presenting hazards to health    PONV (postoperative nausea and vomiting)    SCC (squamous cell carcinoma) 03/28/2008   R dorsum hand - SCC   SCC (squamous cell carcinoma) 05/09/2009   R lat lower leg - SCCIS   SCC (squamous cell carcinoma) 05/09/2009   L lat lower leg - SCCIS   SCC (squamous cell carcinoma) 04/07/2013   L lower leg - SCCIS   SCC (squamous cell carcinoma) 04/27/2013   R forearm - SCC   SCC (squamous cell carcinoma) 04/28/2017   L lat knee - SCCIS   SCC (squamous cell carcinoma) 05/12/2018   L prox dorsum forearm - SCC   SCC (squamous cell carcinoma) 06/13/2021   left forearm tx'd w/ EDC   SCC (squamous cell  carcinoma), leg, right 03/30/2007   R sup pretibial - SCCIS   SCC (squamous cell carcinoma);BCC 10/12/2013   Mid back - superficial BCC with SCCIS    Special screening for malignant neoplasms, colon    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   left dorsal forearm, left lat calf   Past Surgical History:  Procedure Laterality Date   ABDOMINAL HYSTERECTOMY  1992   APPENDECTOMY     CERVICAL DISCECTOMY     S/P C7-T1 discectomy with fusion   CHOLECYSTECTOMY  1990   COLONOSCOPY WITH PROPOFOL N/A 02/14/2016   Procedure: COLONOSCOPY WITH PROPOFOL;  Surgeon: Lucilla Lame, MD;  Location: [facility];  Service: Endoscopy;  Laterality: N/A;  PT WOULD LIKE 10 ARRIVAL TIME OR LATER   DILATION AND CURETTAGE OF UTERUS     ESOPHAGOGASTRODUODENOSCOPY (EGD) WITH PROPOFOL N/A 02/14/2016   Procedure: ESOPHAGOGASTRODUODENOSCOPY (EGD) WITH PROPOFOL with dialtion;  Surgeon: Lucilla Lame, MD;  Location: [facility];  Service: Endoscopy;  Laterality: N/A;   ESOPHAGOGASTRODUODENOSCOPY (EGD) WITH PROPOFOL N/A 04/13/2019   Procedure: ESOPHAGOGASTRODUODENOSCOPY (EGD) WITH PROPOFOL;  Surgeon: Toledo, Benay Pike, MD;  Location: [facility];  Service: Gastroenterology;  Laterality: N/A;   MELANOMA EXCISION     removed from Left calf 1994   Family History  Problem Relation Age of Onset   Hodgkin's lymphoma Mother    Heart failure Father    Heart attack Father    Arthritis Sister        Three sisters w/ degeneratve disk disease   Headache Sister        Two sisters hx of headache   Breast cancer Neg Hx    Colon cancer Neg Hx    Bladder Cancer Neg Hx    Kidney cancer Neg Hx    Social History   Socioeconomic History   Marital status: Widowed    Spouse name: Not on file   Number of children: 2   Years of education: Not on file   Highest education level: Not on file  Occupational History   Not on file  Tobacco Use   Smoking status: Former    Packs/day: 1.00    Years: 15.00    Total pack  years: 15.00    Types: Cigarettes   Smokeless tobacco: Never  Vaping Use   Vaping Use: Never used  Substance and Sexual Activity   Alcohol use: No    Alcohol/week: 0.0 standard drinks of alcohol   Drug use: No   Sexual activity: Not on file  Other Topics Concern   Not on file  Social History Narrative   Married and has  2 children, daughters.   Social Determinants of Health   Financial Resource Strain: Not on file  Food Insecurity: Not on file  Transportation Needs: Not on file  Physical Activity: Not on file  Stress: Not on file  Social Connections: Not on file  Intimate Partner Violence: Not on file   Current Meds  Medication Sig   acetaminophen (TYLENOL) 325 MG tablet Take 650 mg by mouth as needed.   esomeprazole (NEXIUM) 40 MG capsule Take 1 capsule (40 mg total) by mouth daily.   hydrALAZINE (APRESOLINE) 25 MG tablet Take 1 tablet (25 mg total) by mouth 2 (two) times daily.   ibuprofen (ADVIL,MOTRIN) 200 MG tablet Take 200 mg by mouth every 6 (six) hours as needed.   imipramine (TOFRANIL) 10 MG tablet TAKE 1 TABLET BY MOUTH AT BEDTIME   lovastatin (MEVACOR) 20 MG tablet Take 1 tablet (20 mg total) by mouth at bedtime.   nystatin cream (MYCOSTATIN) Apply 1 application topically 2 (two) times daily.   traMADol (ULTRAM) 50 MG tablet Take 1 tablet (50 mg total) by mouth every 12 (twelve) hours as needed for up to 5 days.   triamcinolone cream (KENALOG) 0.1 % Apply 1 application topically 2 (two) times daily as needed.   trospium (SANCTURA) 20 MG tablet Take 1 tablet by mouth once daily   Allergies  Allergen Reactions   Augmentin [Amoxicillin-Pot Clavulanate] Swelling and Rash    Swelling of the lips   Lexapro [Escitalopram Oxalate]    Micardis [Telmisartan] Itching   Myrbetriq [Mirabegron] Itching   Niacin And Related Itching   Codeine Sulfate Other (See Comments)    "Makes her hyper"   Vesicare [Solifenacin Succinate] Nausea And Vomiting and Rash   Recent Results  (from the past 2160 hour(s))  TSH     Status: None   Collection Time: 06/05/22  8:36 AM  Result Value Ref Range   TSH 5.42 0.35 - 5.50 uIU/mL  Lipid panel     Status: None   Collection Time: 06/05/22  8:36 AM  Result Value Ref Range   Cholesterol 173 0 - 200 mg/dL    Comment: ATP III Classification       Desirable:  < 200 mg/dL               Borderline High:  200 - 239 mg/dL          High:  > = 240 mg/dL   Triglycerides 62.0 0.0 - 149.0 mg/dL    Comment: Normal:  <150 mg/dLBorderline High:  150 - 199 mg/dL   HDL 76.60 >39.00 mg/dL   VLDL 12.4 0.0 - 40.0 mg/dL   LDL Cholesterol 84 0 - 99 mg/dL   Total CHOL/HDL Ratio 2     Comment:                Men          Women1/2 Average Risk     3.4          3.3Average Risk          5.0          4.42X Average Risk          9.6          7.13X Average Risk          15.0          11.0  NonHDL 96.73     Comment: NOTE:  Non-HDL goal should be 30 mg/dL higher than patient's LDL goal (i.e. LDL goal of < 70 mg/dL, would have non-HDL goal of < 100 mg/dL)  Hemoglobin A1c     Status: None   Collection Time: 06/05/22  8:36 AM  Result Value Ref Range   Hgb A1c MFr Bld 5.9 4.6 - 6.5 %    Comment: Glycemic Control Guidelines for People with Diabetes:Non Diabetic:  <6%Goal of Therapy: <7%Additional Action Suggested:  >8%   Hepatic function panel     Status: None   Collection Time: 06/05/22  8:36 AM  Result Value Ref Range   Total Bilirubin 0.7 0.2 - 1.2 mg/dL   Bilirubin, Direct 0.2 0.0 - 0.3 mg/dL   Alkaline Phosphatase 72 39 - 117 U/L   AST 16 0 - 37 U/L   ALT 10 0 - 35 U/L   Total Protein 6.5 6.0 - 8.3 g/dL   Albumin 4.2 3.5 - 5.2 g/dL  Basic metabolic panel     Status: Abnormal   Collection Time: 06/05/22  8:36 AM  Result Value Ref Range   Sodium 139 135 - 145 mEq/L   Potassium 4.1 3.5 - 5.1 mEq/L   Chloride 103 96 - 112 mEq/L   CO2 28 19 - 32 mEq/L   Glucose, Bld 97 70 - 99 mg/dL   BUN 10 6 - 23 mg/dL   Creatinine, Ser 1.03  0.40 - 1.20 mg/dL   GFR 50.68 (L) >60.00 mL/min    Comment: Calculated using the CKD-EPI Creatinine Equation (2021)   Calcium 9.1 8.4 - 10.5 mg/dL  CBC with Differential/Platelet     Status: None   Collection Time: 06/05/22  8:36 AM  Result Value Ref Range   WBC 4.9 4.0 - 10.5 K/uL   RBC 4.37 3.87 - 5.11 Mil/uL   Hemoglobin 12.9 12.0 - 15.0 g/dL   HCT 38.7 36.0 - 46.0 %   MCV 88.7 78.0 - 100.0 fl   MCHC 33.3 30.0 - 36.0 g/dL   RDW 15.0 11.5 - 15.5 %   Platelets 227.0 150.0 - 400.0 K/uL   Neutrophils Relative % 66.9 43.0 - 77.0 %   Lymphocytes Relative 21.6 12.0 - 46.0 %   Monocytes Relative 9.1 3.0 - 12.0 %   Eosinophils Relative 1.2 0.0 - 5.0 %   Basophils Relative 1.2 0.0 - 3.0 %   Neutro Abs 3.2 1.4 - 7.7 K/uL   Lymphs Abs 1.0 0.7 - 4.0 K/uL   Monocytes Absolute 0.4 0.1 - 1.0 K/uL   Eosinophils Absolute 0.1 0.0 - 0.7 K/uL   Basophils Absolute 0.1 0.0 - 0.1 K/uL   Objective  Body mass index is 25.78 kg/m. Wt Readings from Last 3 Encounters:  06/26/22 150 lb 3.2 oz (68.1 kg)  06/19/22 148 lb (67.1 kg)  05/01/22 147 lb 6.4 oz (66.9 kg)   Temp Readings from Last 3 Encounters:  06/26/22 98.1 F (36.7 C) (Oral)  05/01/22 98.5 F (36.9 C) (Temporal)  02/20/22 97.6 F (36.4 C) (Temporal)   BP Readings from Last 3 Encounters:  06/26/22 136/60  05/01/22 132/68  03/20/22 (!) 166/79   Pulse Readings from Last 3 Encounters:  06/26/22 88  05/01/22 86  03/20/22 (!) 108    Physical Exam Vitals and nursing note reviewed.  Constitutional:      Appearance: Normal appearance. She is well-developed and well-groomed.  HENT:     Head: Normocephalic and atraumatic.  Eyes:  Conjunctiva/sclera: Conjunctivae normal.     Pupils: Pupils are equal, round, and reactive to light.  Cardiovascular:     Rate and Rhythm: Normal rate and regular rhythm.     Heart sounds: Normal heart sounds. No murmur heard. Pulmonary:     Effort: Pulmonary effort is normal.     Breath sounds:  Normal breath sounds.  Abdominal:     General: Abdomen is flat. Bowel sounds are normal.     Tenderness: There is no abdominal tenderness.  Musculoskeletal:        General: No tenderness.       Arms:  Skin:    General: Skin is warm and dry.  Neurological:     General: No focal deficit present.     Mental Status: She is alert and oriented to person, place, and time. Mental status is at baseline.     Cranial Nerves: Cranial nerves 2-12 are intact.     Motor: Motor function is intact.     Coordination: Coordination is intact.     Gait: Gait is intact.  Psychiatric:        Attention and Perception: Attention and perception normal.        Mood and Affect: Mood and affect normal.        Speech: Speech normal.        Behavior: Behavior normal. Behavior is cooperative.        Thought Content: Thought content normal.        Cognition and Memory: Cognition and memory normal.        Judgment: Judgment normal.     Assessment  Plan  Rib pain on left side +6th and 7th rib fracture +- Plan: DG Ribs Unilateral W/Chest Left, traMADol (ULTRAM) 50 MG tablet bid prn  Tylenol arthritis  Tramadol 1x per day 2x per day  Lidocaine pain patch over the counter  If constipation Miralax 1-2 caps daily with colace 100-200 mg daily  Warm prune juice   Mid back pain +arthritis and curvature of spine mid - Plan: DG Thoracic Spine 2 View, traMADol (ULTRAM) 50 MG tablet See above   Abnormal gait, high risk falls  Declines PT for now but rec consider in the future   Osteoporosis, unspecified osteoporosis type, unspecified pathological fracture presence Pt agreeable to prolia  Pt needs vitamin D lab checked  Rec tums daily for daily calcium and D3 2000 IU daily otc    Overactive bladder uses pads for now  F/u with PCP in 4-6 weeks   Provider: Dr. Olivia Mackie [facility]

## 2022-06-28 ENCOUNTER — Encounter: Payer: Self-pay | Admitting: Internal Medicine

## 2022-06-28 DIAGNOSIS — W19XXXA Unspecified fall, initial encounter: Secondary | ICD-10-CM | POA: Insufficient documentation

## 2022-06-28 NOTE — Assessment & Plan Note (Signed)
Recent fall.  Slid onto floor.  No head injury.  States is better.  Follow.

## 2022-06-28 NOTE — Assessment & Plan Note (Signed)
Discussed.  Overall appears to be doing better.  Desires no medication at this time.  Follow.  Itching better

## 2022-06-28 NOTE — Assessment & Plan Note (Signed)
Intolerant to amlodipine.  On hydralazine.  Follow pressures.  Follow metabolic panel.  

## 2022-06-28 NOTE — Assessment & Plan Note (Signed)
No upper symptoms reported.  Continue nexium.  

## 2022-06-28 NOTE — Assessment & Plan Note (Signed)
Continue mevacor.  Low cholesterol diet and exercise.  Follow lipid panel and liver function tests.  

## 2022-06-28 NOTE — Assessment & Plan Note (Signed)
Low carb diet and exercise. Follow met b and a1c.  

## 2022-06-28 NOTE — Assessment & Plan Note (Signed)
Continue mevacor °

## 2022-06-28 NOTE — Assessment & Plan Note (Signed)
Followed by urology.  On imipramine and sanctura.  

## 2022-06-29 NOTE — Progress Notes (Signed)
Pt interested in prolia with rib fractures and h/o osteoporosis   Rachael Austin please inform pt and daughter to call [facility] and sch DEXA and sch lab visit for vitamin D  She can f/u with PCP in 07/2022 as scheduled

## 2022-06-29 NOTE — Addendum Note (Signed)
Addended by: Orland Mustard on: 06/29/2022 08:27 PM   Modules accepted: Orders

## 2022-07-01 ENCOUNTER — Telehealth: Payer: Self-pay

## 2022-07-01 NOTE — Telephone Encounter (Signed)
Spoke with pt in regards to Dr. Olivia Mackie wanting her to call [facility] for her DEXA bone density scan and to get her scheduled for a lab appt to check her vitamin D.   Pt stated she will call us back to get scheduled for the lab appt.

## 2022-07-01 NOTE — Progress Notes (Signed)
Pt has been informed and stated she just got her tooth extracted and will CB in regards to getting scheduled for lab appt

## 2022-07-07 ENCOUNTER — Other Ambulatory Visit: Payer: Self-pay

## 2022-07-07 ENCOUNTER — Telehealth: Payer: Self-pay | Admitting: Urology

## 2022-07-07 ENCOUNTER — Telehealth: Payer: Self-pay | Admitting: Internal Medicine

## 2022-07-07 DIAGNOSIS — N3281 Overactive bladder: Secondary | ICD-10-CM

## 2022-07-07 MED ORDER — TROSPIUM CHLORIDE 20 MG PO TABS: 20.0000 mg | ORAL_TABLET | Freq: Every day | ORAL | 3 refills | Status: DC

## 2022-07-07 MED ORDER — IMIPRAMINE HCL 10 MG PO TABS: 10.0000 mg | ORAL_TABLET | Freq: Every day | ORAL | 3 refills | Status: DC

## 2022-07-07 MED ORDER — LOVASTATIN 20 MG PO TABS: 20.0000 mg | ORAL_TABLET | Freq: Every day | ORAL | 1 refills | Status: DC

## 2022-07-07 NOTE — Telephone Encounter (Signed)
Patient called and stated she has been trying to get her lovastatin (MEVACOR) 20 MG tablet refilled through her pharmacy. She was told by her pharmacy that 3 attempts have been made, patient is out of her medication.

## 2022-07-07 NOTE — Telephone Encounter (Signed)
Fax received from [facility],  rx sent to pharmacy by e-script

## 2022-07-07 NOTE — Telephone Encounter (Signed)
Patient called the office today to request refills on her prescriptions: Trospium Imipramine  Please advise

## 2022-07-09 ENCOUNTER — Other Ambulatory Visit: Payer: Self-pay

## 2022-07-09 MED ORDER — LOVASTATIN 20 MG PO TABS: 20.0000 mg | ORAL_TABLET | Freq: Every day | ORAL | 1 refills | Status: DC

## 2022-07-17 ENCOUNTER — Telehealth: Payer: Self-pay | Admitting: *Deleted

## 2022-07-17 NOTE — Telephone Encounter (Signed)
Pt interested in prolia with rib fractures and h/o osteoporosis   $0 due for Prolia, PA req'd. However, pt will need Vitamin D lab prior to starting on Prolia.  Pt will also need to be on a Calcium & Vit D supplement.  Initial Prolia request was started by Dr. Kelly Services PCP: Dr. Nicki Reaper

## 2022-07-18 NOTE — Telephone Encounter (Signed)
Notify Ms Wildermuth to start caltrate '600mg'$  plus D - take bid - for her bones.  I can check a vitamin D level when she comes in for her appt if desires.

## 2022-07-18 NOTE — Telephone Encounter (Signed)
LMTCB

## 2022-07-21 NOTE — Telephone Encounter (Signed)
Patient returned office phone call. Note was read, but she has questions. Please call her

## 2022-07-24 NOTE — Telephone Encounter (Signed)
Attempted to call pt to answer questions. Phone rang 10 times with no answer and no voicemail.

## 2022-07-25 NOTE — Telephone Encounter (Signed)
Called pt no answer and no voicemail.

## 2022-07-28 ENCOUNTER — Encounter (INDEPENDENT_AMBULATORY_CARE_PROVIDER_SITE_OTHER): Payer: Self-pay

## 2022-07-29 NOTE — Telephone Encounter (Signed)
Understood, made note to discuss at appt on 08/07/22

## 2022-07-29 NOTE — Telephone Encounter (Signed)
Prolia Prior Auth received

## 2022-07-29 NOTE — Telephone Encounter (Signed)
Patient states she is not sure she wants to take Prolia.  Patient states she understands that there are a lot of side effects and she would like to discuss with Dr. Einar Pheasant at her appointment on 08/07/2022.

## 2022-08-07 ENCOUNTER — Ambulatory Visit: Payer: Medicare PPO | Admitting: Internal Medicine

## 2022-08-07 ENCOUNTER — Encounter: Payer: Self-pay | Admitting: Internal Medicine

## 2022-08-07 VITALS — BP 128/64 | HR 87 | Temp 97.9°F | Ht 64.0 in | Wt 146.2 lb

## 2022-08-07 DIAGNOSIS — Z85820 Personal history of malignant melanoma of skin: Secondary | ICD-10-CM | POA: Diagnosis not present

## 2022-08-07 DIAGNOSIS — R739 Hyperglycemia, unspecified: Secondary | ICD-10-CM | POA: Diagnosis not present

## 2022-08-07 DIAGNOSIS — I700 Atherosclerosis of aorta: Secondary | ICD-10-CM | POA: Diagnosis not present

## 2022-08-07 DIAGNOSIS — N3010 Interstitial cystitis (chronic) without hematuria: Secondary | ICD-10-CM

## 2022-08-07 DIAGNOSIS — F439 Reaction to severe stress, unspecified: Secondary | ICD-10-CM

## 2022-08-07 DIAGNOSIS — E7800 Pure hypercholesterolemia, unspecified: Secondary | ICD-10-CM

## 2022-08-07 DIAGNOSIS — I10 Essential (primary) hypertension: Secondary | ICD-10-CM | POA: Diagnosis not present

## 2022-08-07 DIAGNOSIS — L299 Pruritus, unspecified: Secondary | ICD-10-CM

## 2022-08-07 DIAGNOSIS — S2242XA Multiple fractures of ribs, left side, initial encounter for closed fracture: Secondary | ICD-10-CM

## 2022-08-07 DIAGNOSIS — R300 Dysuria: Secondary | ICD-10-CM

## 2022-08-07 DIAGNOSIS — N898 Other specified noninflammatory disorders of vagina: Secondary | ICD-10-CM | POA: Diagnosis not present

## 2022-08-07 DIAGNOSIS — S2242XD Multiple fractures of ribs, left side, subsequent encounter for fracture with routine healing: Secondary | ICD-10-CM

## 2022-08-07 DIAGNOSIS — M810 Age-related osteoporosis without current pathological fracture: Secondary | ICD-10-CM

## 2022-08-07 DIAGNOSIS — K219 Gastro-esophageal reflux disease without esophagitis: Secondary | ICD-10-CM | POA: Diagnosis not present

## 2022-08-07 LAB — POCT URINALYSIS DIPSTICK
Bilirubin, UA: NEGATIVE
Blood, UA: NEGATIVE
Glucose, UA: NEGATIVE
Ketones, UA: NEGATIVE
Leukocytes, UA: NEGATIVE
Nitrite, UA: NEGATIVE
Protein, UA: NEGATIVE
Spec Grav, UA: 1.01 (ref 1.010–1.025)
Urobilinogen, UA: 0.2 E.U./dL
pH, UA: 7 (ref 5.0–8.0)

## 2022-08-07 LAB — URINALYSIS, ROUTINE W REFLEX MICROSCOPIC
Bilirubin Urine: NEGATIVE
Hgb urine dipstick: NEGATIVE
Ketones, ur: NEGATIVE
Nitrite: NEGATIVE
RBC / HPF: NONE SEEN (ref 0–?)
Specific Gravity, Urine: <=1.005 — AB (ref 1.000–1.030)
Total Protein, Urine: NEGATIVE
Urine Glucose: NEGATIVE
Urobilinogen, UA: 0.2 (ref 0.0–1.0)
pH: 7 (ref 5.0–8.0)

## 2022-08-07 NOTE — Progress Notes (Addendum)
[name], female   DOB: Apr 22, 1941, 81 y.o.   MRN: [id number]   Subjective:    [name], female    DOB: 11-Jul-1941, 81 y.o.   MRN: [id number]   Patient here for  Chief Complaint  Patient presents with   Follow-up    Discuss Prolia   Hypertension   Urinary Tract Infection   .   HPI Here to follow up regarding recent fall. Rib ffracture 6 & 7. Pain is better.  No pain now with deep breathing or coughing.  No chest pain.  No cough or congestion.  Eating.  No vomiting. Walking. Did notice some burning with urination - appears to be more c/w urine contacting the skin.  No discharge.  Increased stress and some mild depression.  No SI.  Does feel needs to be on something to help level things out.  Discussed previous zoloft.  She was concerned about increased itching.  The itching has continued.  Request to see Dr Laurence Ferrari for further testing.     Past Medical History:  Diagnosis Date   Basal cell carcinoma 06/21/2020   left post shoulder sup, left mid chest, left lat thigh   Basal cell carcinoma 11/07/2021   L upper back, EDC   Basal cell carcinoma 11/07/2021   R upper back, EDC   Basal cell carcinoma 11/27/2021   right upper arm, EDC   Basal cell carcinoma 11/27/2021   right shoulder posterior, EDC   Basal cell carcinoma 11/27/2021   right anterior shoulder, EDC   Basal cell carcinoma (BCC) 06/13/2021   left dorsal foot, EDC 10/02/2021   Basal cell carcinoma (BCC) 06/13/2021   right postauricular tx'd w/ EDC   BCC (basal cell carcinoma of skin) 03/30/2007   R inf med pretibial - BCC   BCC (basal cell carcinoma of skin) 10/12/2013   L nasal ala - BCC   BCC (basal cell carcinoma of skin) 03/10/2018   L mid dorsum med forearm - superficial BCC    BCC (basal cell carcinoma) 10/02/2021   left dorsal foot proximal, EDC 10/02/2021   BCC (basal cell carcinoma) 10/02/2021   left mid back, superficial EDC 11/07/21   BCC (basal cell carcinoma) 10/02/2021    right pretibia   BCC (basal cell carcinoma) 10/02/2021   right mid back, superficial EDC 11/07/21   Breast screening, unspecified 2013   Cancer ([place]) 1992   skin   Degenerative disk disease    GERD (gastroesophageal reflux disease)    History of basal cell carcinoma (BCC) 08/29/2020    left inferior knee lateral, , left inferior knee medial, right pretibia   History of SCC (squamous cell carcinoma) of skin 08/29/2020   right posterior calf, left lateral calf, and    Hypercholesterolemia 2008   Interstitial cystitis    followed by Dr Jacqlyn Larsen   Other sign and symptom in breast 2013   Left upper outer quadrant breast "soreness" Ultrasound exam of right breast in the 2 o'clock position with the breast distracted medially showed a 0.3-0.4 with 0.5 cm simple cyst. In the 1 o'clock position where pt reported tenderness US exam was negatiive.  Because of her history of intermittent nipple drainage, ultrasound was completed of the retroareolar area.   Personal history of tobacco use, presenting hazards to health    PONV (postoperative nausea and vomiting)    SCC (squamous cell carcinoma) 03/28/2008   R dorsum hand - SCC   SCC (squamous cell carcinoma)  05/09/2009   R lat lower leg - SCCIS   SCC (squamous cell carcinoma) 05/09/2009   L lat lower leg - SCCIS   SCC (squamous cell carcinoma) 04/07/2013   L lower leg - SCCIS   SCC (squamous cell carcinoma) 04/27/2013   R forearm - SCC   SCC (squamous cell carcinoma) 04/28/2017   L lat knee - SCCIS   SCC (squamous cell carcinoma) 05/12/2018   L prox dorsum forearm - SCC   SCC (squamous cell carcinoma) 06/13/2021   left forearm tx'd w/ EDC   SCC (squamous cell carcinoma), leg, right 03/30/2007   R sup pretibial - SCCIS   SCC (squamous cell carcinoma);BCC 10/12/2013   Mid back - superficial BCC with SCCIS    Special screening for malignant neoplasms, colon    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   left dorsal forearm, left lat calf    Past Surgical History:  Procedure Laterality Date   ABDOMINAL HYSTERECTOMY  1992   APPENDECTOMY     CERVICAL DISCECTOMY     S/P C7-T1 discectomy with fusion   CHOLECYSTECTOMY  1990   COLONOSCOPY WITH PROPOFOL N/A 02/14/2016   Procedure: COLONOSCOPY WITH PROPOFOL;  Surgeon: Lucilla Lame, MD;  Location: [facility];  Service: Endoscopy;  Laterality: N/A;  PT WOULD LIKE 10 ARRIVAL TIME OR LATER   DILATION AND CURETTAGE OF UTERUS     ESOPHAGOGASTRODUODENOSCOPY (EGD) WITH PROPOFOL N/A 02/14/2016   Procedure: ESOPHAGOGASTRODUODENOSCOPY (EGD) WITH PROPOFOL with dialtion;  Surgeon: Lucilla Lame, MD;  Location: [facility];  Service: Endoscopy;  Laterality: N/A;   ESOPHAGOGASTRODUODENOSCOPY (EGD) WITH PROPOFOL N/A 04/13/2019   Procedure: ESOPHAGOGASTRODUODENOSCOPY (EGD) WITH PROPOFOL;  Surgeon: Toledo, Benay Pike, MD;  Location: [facility];  Service: Gastroenterology;  Laterality: N/A;   MELANOMA EXCISION     removed from Left calf 1994   Family History  Problem Relation Age of Onset   Hodgkin's lymphoma Mother    Heart failure Father    Heart attack Father    Arthritis Sister        Three sisters w/ degeneratve disk disease   Headache Sister        Two sisters hx of headache   Breast cancer Neg Hx    Colon cancer Neg Hx    Bladder Cancer Neg Hx    Kidney cancer Neg Hx    Social History   Socioeconomic History   Marital status: Widowed    Spouse name: Not on file   Number of children: 2   Years of education: Not on file   Highest education level: Not on file  Occupational History   Not on file  Tobacco Use   Smoking status: Former    Packs/day: 1.00    Years: 15.00    Total pack years: 15.00    Types: Cigarettes   Smokeless tobacco: Never  Vaping Use   Vaping Use: Never used  Substance and Sexual Activity   Alcohol use: No    Alcohol/week: 0.0 standard drinks of alcohol   Drug use: No   Sexual activity: Not on file  Other Topics Concern   Not on file   Social History Narrative   Married and has 2 children, daughters.   Social Determinants of Health   Financial Resource Strain: Not on file  Food Insecurity: Not on file  Transportation Needs: Not on file  Physical Activity: Not on file  Stress: Not on file  Social Connections: Not on file     Review  of Systems  Constitutional:  Negative for appetite change, fever and unexpected weight change.  HENT:  Negative for congestion and sinus pressure.   Respiratory:  Negative for cough, chest tightness and shortness of breath.   Cardiovascular:  Negative for chest pain, palpitations and leg swelling.  Gastrointestinal:  Negative for abdominal pain, diarrhea, nausea and vomiting.  Genitourinary:  Negative for difficulty urinating.       Burning with urination a outlined.   Musculoskeletal:  Negative for joint swelling and myalgias.  Skin:  Negative for color change and rash.  Neurological:  Negative for dizziness and headaches.  Psychiatric/Behavioral:  Negative for agitation.        Increased stress and mild depression as outlined.         Objective:     BP 128/64   Pulse 87   Temp 97.9 F (36.6 C) (Oral)   Ht _0  (1.626 m)   Wt 146 lb 3.2 oz (66.3 kg)   LMP 11/20/1976   SpO2 97%   BMI 25.10 kg/m  Wt Readings from Last 3 Encounters:  08/07/22 146 lb 3.2 oz (66.3 kg)  06/26/22 150 lb 3.2 oz (68.1 kg)  06/19/22 148 lb (67.1 kg)    Physical Exam Vitals reviewed.  Constitutional:      General: She is not in acute distress.    Appearance: Normal appearance.  HENT:     Head: Normocephalic and atraumatic.     Right Ear: External ear normal.     Left Ear: External ear normal.  Eyes:     General: No scleral icterus.       Right eye: No discharge.        Left eye: No discharge.     Conjunctiva/sclera: Conjunctivae normal.  Neck:     Thyroid: No thyromegaly.  Cardiovascular:     Rate and Rhythm: Normal rate and regular rhythm.  Pulmonary:     Effort: No  respiratory distress.     Breath sounds: Normal breath sounds. No wheezing.  Abdominal:     General: Bowel sounds are normal.     Palpations: Abdomen is soft.     Tenderness: There is no abdominal tenderness.  Genitourinary:    Comments: Minimal erythema - outer vaginal area.  Musculoskeletal:        General: No swelling or tenderness.     Cervical back: Neck supple. No tenderness.  Lymphadenopathy:     Cervical: No cervical adenopathy.  Skin:    Findings: No erythema or rash.  Neurological:     Mental Status: She is alert.  Psychiatric:        Mood and Affect: Mood normal.        Behavior: Behavior normal.      Outpatient Encounter Medications as of 08/07/2022  Medication Sig   acetaminophen (TYLENOL) 325 MG tablet Take 650 mg by mouth as needed.   esomeprazole (NEXIUM) 40 MG capsule Take 1 capsule (40 mg total) by mouth daily.   hydrALAZINE (APRESOLINE) 25 MG tablet Take 1 tablet (25 mg total) by mouth 2 (two) times daily.   imipramine (TOFRANIL) 10 MG tablet Take 1 tablet (10 mg total) by mouth at bedtime.   lovastatin (MEVACOR) 20 MG tablet Take 1 tablet (20 mg total) by mouth at bedtime.   nystatin cream (MYCOSTATIN) Apply 1 application topically 2 (two) times daily.   triamcinolone cream (KENALOG) 0.1 % Apply 1 application topically 2 (two) times daily as needed.   trospium (SANCTURA) 20 MG  tablet Take 1 tablet (20 mg total) by mouth daily.   [DISCONTINUED] ibuprofen (ADVIL,MOTRIN) 200 MG tablet Take 200 mg by mouth every 6 (six) hours as needed. (Patient not taking: Reported on 08/07/2022)   No facility-administered encounter medications on file as of 08/07/2022.     Lab Results  Component Value Date   WBC 4.9 06/05/2022   HGB 12.9 06/05/2022   HCT 38.7 06/05/2022   PLT 227.0 06/05/2022   GLUCOSE 97 06/05/2022   CHOL 173 06/05/2022   TRIG 62.0 06/05/2022   HDL 76.60 06/05/2022   LDLCALC 84 06/05/2022   ALT 10 06/05/2022   AST 16 06/05/2022   NA 139 06/05/2022    K 4.1 06/05/2022   CL 103 06/05/2022   CREATININE 1.03 06/05/2022   BUN 10 06/05/2022   CO2 28 06/05/2022   TSH 5.42 06/05/2022   HGBA1C 5.9 06/05/2022    MM 3D SCREEN BREAST BILATERAL  Result Date: 05/01/2022 CLINICAL DATA:  Screening. EXAM: DIGITAL SCREENING BILATERAL MAMMOGRAM WITH TOMOSYNTHESIS AND CAD TECHNIQUE: Bilateral screening digital craniocaudal and mediolateral oblique mammograms were obtained. Bilateral screening digital breast tomosynthesis was performed. The images were evaluated with computer-aided detection. COMPARISON:  Previous exam(s). ACR Breast Density Category b: There are scattered areas of fibroglandular density. FINDINGS: There are no findings suspicious for malignancy. IMPRESSION: No mammographic evidence of malignancy. A result letter of this screening mammogram will be mailed directly to the patient. RECOMMENDATION: Screening mammogram in one year. (Code:SM-B-01Y) BI-RADS CATEGORY  1: Negative. Electronically Signed   By: Nolon Nations M.D.   On: 05/01/2022 14:20       Assessment & Plan:   Problem List Items Addressed This Visit     Aortic atherosclerosis ([place])    Continue mevacor.       Chronic interstitial cystitis    Followed by urology.  On imipramine and sanctura.       Dysuria    Appears to be more c/w vaginal burning (outer vaginal area).  Will send urine for culture.  Hold abx.  Nystatin cream as directed       Relevant Orders   POCT Urinalysis Dipstick (Completed)   Urine Culture (Completed)   Urinalysis, Routine w reflex microscopic (Completed)   Essential hypertension    Intolerant to amlodipine.  On hydralazine.  Follow pressures.  Follow metabolic panel.       GERD (gastroesophageal reflux disease)    No upper symptoms reported.  Continue nexium.       H/O Malignant melanoma    Followed by dermatology.       Relevant Orders   Ambulatory referral to Dermatology   Hypercholesterolemia    Continue mevacor.  Low cholesterol  diet and exercise.  Follow lipid panel and liver function tests.       Hyperglycemia    Low carb diet and exercise. Follow met b and a1c.       Itching    Persistent.  Liver wnl.  Request referral to dermatology.       Relevant Orders   Ambulatory referral to Dermatology   Multiple closed fractures of ribs of left side    No pain now.  Discussed bone density and treatment options.  Discussed reclast and prolia.  Given her GI history, wants to avoid oral bisphosphonates.  She will notify me what she decides.       Osteoporosis      Discussed bone density and treatment options.  Discussed reclast and prolia.  Given her GI  history, wants to avoid oral bisphosphonates.  She will notify me what she decides.       Stress    Increased stress.  Discussed.  She feels she needs to be on something to help level things out.  No SI.  Reviewed chart.  She had been on zoloft for awhile. Previously stopped.  Was not sure helping.  Now feels did help.  Would like to restart.        Vaginal irritation - Primary    Nystatin cream as directed.         I spent 45 minutes with the patient.  Time spent discussing her current concerns and symptoms.  Specifically time spent discussing her current concerns with itching, vaginal irritation and increased stress and depression.  Time also spent discussing osteoporosis and treatment. Time also spent discussing further w/up, evaluation and treatment.   Einar Pheasant, MD

## 2022-08-08 LAB — URINE CULTURE
MICRO NUMBER:: 14167536
SPECIMEN QUALITY:: ADEQUATE

## 2022-08-09 ENCOUNTER — Encounter: Payer: Self-pay | Admitting: Internal Medicine

## 2022-08-09 ENCOUNTER — Telehealth: Payer: Self-pay | Admitting: Internal Medicine

## 2022-08-09 DIAGNOSIS — L299 Pruritus, unspecified: Secondary | ICD-10-CM | POA: Insufficient documentation

## 2022-08-09 NOTE — Assessment & Plan Note (Signed)
No upper symptoms reported.  Continue nexium.  

## 2022-08-09 NOTE — Assessment & Plan Note (Signed)
Intolerant to amlodipine.  On hydralazine.  Follow pressures.  Follow metabolic panel.  

## 2022-08-09 NOTE — Assessment & Plan Note (Signed)
Nystatin cream as directed.

## 2022-08-09 NOTE — Assessment & Plan Note (Signed)
Followed by dermatology

## 2022-08-09 NOTE — Assessment & Plan Note (Signed)
Appears to be more c/w vaginal burning (outer vaginal area).  Will send urine for culture.  Hold abx.  Nystatin cream as directed

## 2022-08-09 NOTE — Assessment & Plan Note (Signed)
Discussed bone density and treatment options.  Discussed reclast and prolia.  Given her GI history, wants to avoid oral bisphosphonates.  She will notify me what she decides.

## 2022-08-09 NOTE — Assessment & Plan Note (Signed)
No pain now.  Discussed bone density and treatment options.  Discussed reclast and prolia.  Given her GI history, wants to avoid oral bisphosphonates.  She will notify me what she decides.

## 2022-08-09 NOTE — Assessment & Plan Note (Signed)
Persistent.  Liver wnl.  Request referral to dermatology.

## 2022-08-09 NOTE — Assessment & Plan Note (Addendum)
Increased stress.  Discussed.  She feels she needs to be on something to help level things out.  No SI.  Reviewed chart.  She had been on zoloft for awhile. Previously stopped.  Was not sure helping.  Now feels did help.  Would like to restart.

## 2022-08-09 NOTE — Assessment & Plan Note (Signed)
Low carb diet and exercise. Follow met b and a1c.  

## 2022-08-09 NOTE — Assessment & Plan Note (Addendum)
Followed by urology.  On imipramine and sanctura.  

## 2022-08-09 NOTE — Telephone Encounter (Signed)
Notify Rachael Austin that after reviewing the chart, I would like to start her back on zoloft.  Will start with '25mg'$  q day.  Will titrate up if needed.

## 2022-08-09 NOTE — Assessment & Plan Note (Signed)
Continue mevacor °

## 2022-08-09 NOTE — Assessment & Plan Note (Signed)
Continue mevacor.  Low cholesterol diet and exercise.  Follow lipid panel and liver function tests.  

## 2022-08-11 ENCOUNTER — Other Ambulatory Visit: Payer: Self-pay

## 2022-08-11 MED ORDER — SERTRALINE HCL 25 MG PO TABS: 25.0000 mg | ORAL_TABLET | Freq: Every day | ORAL | 1 refills | Status: DC

## 2022-08-11 NOTE — Telephone Encounter (Signed)
Pt agreeable Rx sent in to [facility] as per pt request

## 2022-08-14 ENCOUNTER — Ambulatory Visit: Payer: Medicare PPO | Admitting: Dermatology

## 2022-08-14 DIAGNOSIS — D0472 Carcinoma in situ of skin of left lower limb, including hip: Secondary | ICD-10-CM | POA: Diagnosis not present

## 2022-08-14 DIAGNOSIS — Z1283 Encounter for screening for malignant neoplasm of skin: Secondary | ICD-10-CM | POA: Diagnosis not present

## 2022-08-14 DIAGNOSIS — C44719 Basal cell carcinoma of skin of left lower limb, including hip: Secondary | ICD-10-CM | POA: Diagnosis not present

## 2022-08-14 DIAGNOSIS — D099 Carcinoma in situ, unspecified: Secondary | ICD-10-CM

## 2022-08-14 DIAGNOSIS — L578 Other skin changes due to chronic exposure to nonionizing radiation: Secondary | ICD-10-CM

## 2022-08-14 DIAGNOSIS — L905 Scar conditions and fibrosis of skin: Secondary | ICD-10-CM | POA: Diagnosis not present

## 2022-08-14 DIAGNOSIS — D492 Neoplasm of unspecified behavior of bone, soft tissue, and skin: Secondary | ICD-10-CM

## 2022-08-14 DIAGNOSIS — D0471 Carcinoma in situ of skin of right lower limb, including hip: Secondary | ICD-10-CM

## 2022-08-14 DIAGNOSIS — L821 Other seborrheic keratosis: Secondary | ICD-10-CM | POA: Diagnosis not present

## 2022-08-14 DIAGNOSIS — L309 Dermatitis, unspecified: Secondary | ICD-10-CM

## 2022-08-14 DIAGNOSIS — L814 Other melanin hyperpigmentation: Secondary | ICD-10-CM | POA: Diagnosis not present

## 2022-08-14 DIAGNOSIS — Z85828 Personal history of other malignant neoplasm of skin: Secondary | ICD-10-CM

## 2022-08-14 DIAGNOSIS — D229 Melanocytic nevi, unspecified: Secondary | ICD-10-CM

## 2022-08-14 DIAGNOSIS — L570 Actinic keratosis: Secondary | ICD-10-CM | POA: Diagnosis not present

## 2022-08-14 HISTORY — DX: Carcinoma in situ, unspecified: D09.9

## 2022-08-14 NOTE — Progress Notes (Signed)
Follow-Up Visit   Subjective  Rachael Austin is a 81 y.o. female who presents for the following: Annual Exam (Hx numerous BCC and SCC - patient c/o itching all over especially on the back, and she has an irregular skin lesion on the L calf).   The following portions of the chart were reviewed this encounter and updated as appropriate:   Tobacco  Allergies  Meds  Problems  Med Hx  Surg Hx  Fam Hx      Review of Systems:  No other skin or systemic complaints except as noted in HPI or Assessment and Plan.  Objective  Well appearing patient in no apparent distress; mood and affect are within normal limits.  A full examination was performed including scalp, head, eyes, ears, nose, lips, neck, chest, axillae, abdomen, back, buttocks, bilateral upper extremities, bilateral lower extremities, hands, feet, fingers, toes, fingernails, and toenails. All findings within normal limits unless otherwise noted below.  L arm x 1, L prox forearm x 1 (2) Erythematous thin papules/macules with thicker gritty scale.   Mid Back Right of Midline 2.4 cm thin scaly pink patch R/o BCC     Left Lower Leg Superior 1.0 cm pink and brown scaly papule R/o Atypia        Left Distal Calf 1.4 cm scaly pink plaque R/o SCC     Left Upper Arm 0.1 cm very thin scaly pink papule R/o BCC       Chest right of midline 0.5 cm scaly pink papule R/o BCC     left mid back Thick pink smooth plaque without features suspicious for malignancy on dermoscopy   Back Scaly erythematous papules and plaques    Assessment & Plan  AK (actinic keratosis) (2) L arm x 1, L prox forearm x 1  Hypertrophic  Actinic keratoses are precancerous spots that appear secondary to cumulative UV radiation exposure/sun exposure over time. They are chronic with expected duration over 1 year. A portion of actinic keratoses will progress to squamous cell carcinoma of the skin. It is not possible to  reliably predict which spots will progress to skin cancer and so treatment is recommended to prevent development of skin cancer.  Recommend daily broad spectrum sunscreen SPF 30+ to sun-exposed areas, reapply every 2 hours as needed.  Recommend staying in the shade or wearing long sleeves, sun glasses (UVA+UVB protection) and wide brim hats (4-inch brim around the entire circumference of the hat). Call for new or changing lesions.  Prior to procedure, discussed risks of blister formation, small wound, skin dyspigmentation, or rare scar following cryotherapy. Recommend Vaseline ointment to treated areas while healing.  Hypertrophic at left arm, left proximal forearm Will monitor AK's at lower legs, not treated today.  Will treat upper back right of midline x 1, L popliteal fossa x 1 at follow up.  Destruction of lesion - L arm x 1, L prox forearm x 1  Destruction method: cryotherapy   Informed consent: discussed and consent obtained   Lesion destroyed using liquid nitrogen: Yes   Cryotherapy cycles:  2 Outcome: patient tolerated procedure well with no complications   Post-procedure details: wound care instructions given   Additional details:  Prior to procedure, discussed risks of blister formation, small wound, skin dyspigmentation, or rare scar following cryotherapy. Recommend Vaseline ointment to treated areas while healing.   Neoplasm of skin (5) Mid Back Right of Midline  Left Lower Leg Superior  Epidermal / dermal shaving  Lesion diameter (cm):  1 Informed consent: discussed and consent obtained   Timeout: patient name, date of birth, surgical site, and procedure verified   Anesthesia: the lesion was anesthetized in a standard fashion   Anesthetic:  1% lidocaine w/ epinephrine 1-100,000 local infiltration Instrument used: #15 blade   Hemostasis achieved with: aluminum chloride   Outcome: patient tolerated procedure well   Post-procedure details: wound care instructions  given   Additional details:  Mupirocin and a bandage applied  Specimen 2 - Surgical pathology Differential Diagnosis: R/o Atypia  Check Margins: No 1.0 cm pink and brown scaly papule   Left Distal Calf  Epidermal / dermal shaving  Lesion diameter (cm):  1.4 Informed consent: discussed and consent obtained   Timeout: patient name, date of birth, surgical site, and procedure verified   Anesthesia: the lesion was anesthetized in a standard fashion   Anesthetic:  1% lidocaine w/ epinephrine 1-100,000 local infiltration Instrument used: #15 blade   Hemostasis achieved with: aluminum chloride   Outcome: patient tolerated procedure well   Post-procedure details: wound care instructions given   Additional details:  Mupirocin and a bandage applied  Destruction of lesion  Destruction method: electrodesiccation and curettage   Informed consent: discussed and consent obtained   Timeout:  patient name, date of birth, surgical site, and procedure verified Anesthesia: the lesion was anesthetized in a standard fashion   Anesthetic:  1% lidocaine w/ epinephrine 1-100,000 buffered w/ 8.4% NaHCO3 Curettage performed in three different directions: Yes   Electrodesiccation performed over the curetted area: Yes   Curettage cycles:  3 Final wound size (cm):  1.7 Hemostasis achieved with:  electrodesiccation Outcome: patient tolerated procedure well with no complications   Post-procedure details: sterile dressing applied and wound care instructions given   Dressing type: petrolatum    Specimen 3 - Surgical pathology Differential Diagnosis: R/o SCC  Check Margins: No 1.4 cm scaly pink plaque Treated with EDC   Left Upper Arm  Chest right of midline  Will plan to biopsy and treat with EDC at left upper arm, mid back right of midline and chest right of midline on follow up. Photos taken.  Scar left mid back  Symptomatic, irritating, patient would like treated.  Will plan ILK  injections on follow up.   Dermatitis Back  Benign-appearing.  Observation.  Call clinic for new or changing lesions.    Lentigines - Scattered tan macules - Due to sun exposure - Benign-appearing, observe - Recommend daily broad spectrum sunscreen SPF 30+ to sun-exposed areas, reapply every 2 hours as needed. - Call for any changes  Seborrheic Keratoses - Stuck-on, waxy, tan-brown papules and/or plaques  - Benign-appearing - Discussed benign etiology and prognosis. - Observe - Call for any changes  Melanocytic Nevi - Tan-brown and/or pink-flesh-colored symmetric macules and papules - Benign appearing on exam today - Observation - Call clinic for new or changing moles - Recommend daily use of broad spectrum spf 30+ sunscreen to sun-exposed areas.   Hemangiomas - Red papules - Discussed benign nature - Observe - Call for any changes  Actinic Damage - Chronic condition, secondary to cumulative UV/sun exposure - diffuse scaly erythematous macules with underlying dyspigmentation - Recommend daily broad spectrum sunscreen SPF 30+ to sun-exposed areas, reapply every 2 hours as needed.  - Staying in the shade or wearing long sleeves, sun glasses (UVA+UVB protection) and wide brim hats (4-inch brim around the entire circumference of the hat) are also recommended for sun protection.  - Call for new or  changing lesions.  Skin cancer screening performed today.  History of Basal Cell Carcinoma of the Skin - No evidence of recurrence today - Recommend regular full body skin exams - Recommend daily broad spectrum sunscreen SPF 30+ to sun-exposed areas, reapply every 2 hours as needed.  - Call if any new or changing lesions are noted between office visits  History of Squamous Cell Carcinoma of the Skin - No evidence of recurrence today - No lymphadenopathy - Recommend regular full body skin exams - Recommend daily broad spectrum sunscreen SPF 30+ to sun-exposed areas, reapply  every 2 hours as needed.  - Call if any new or changing lesions are noted between office visits   No follow-ups on file.  Graciella Belton, RMA, am acting as scribe for Forest Gleason, MD .  Documentation: I have reviewed the above documentation for accuracy and completeness, and I agree with the above.  Forest Gleason, MD

## 2022-08-14 NOTE — Patient Instructions (Addendum)
Cryotherapy Aftercare  Wash gently with soap and water everyday.   Apply Vaseline and Band-Aid daily until healed.  If any bleeding with biopsy site, recommend Bleed Stop powder from [facility].  Wound Care Instructions  Cleanse wound gently with soap and water once a day then pat dry with clean gauze. Apply a thin coat of Petrolatum (petroleum jelly, "Vaseline") over the wound (unless you have an allergy to this). We recommend that you use a new, sterile tube of Vaseline. Do not pick or remove scabs. Do not remove the yellow or white "healing tissue" from the base of the wound.  Cover the wound with fresh, clean, nonstick gauze and secure with paper tape. You may use Band-Aids in place of gauze and tape if the wound is small enough, but would recommend trimming much of the tape off as there is often too much. Sometimes Band-Aids can irritate the skin.  You should call the office for your biopsy report after 1 week if you have not already been contacted.  If you experience any problems, such as abnormal amounts of bleeding, swelling, significant bruising, significant pain, or evidence of infection, please call the office immediately.  FOR ADULT SURGERY PATIENTS: If you need something for pain relief you may take 1 extra strength Tylenol (acetaminophen) AND 2 Ibuprofen ('200mg'$  each) together every 4 hours as needed for pain. (do not take these if you are allergic to them or if you have a reason you should not take them.) Typically, you may only need pain medication for 1 to 3 days.    Due to recent changes in healthcare laws, you may see results of your pathology and/or laboratory studies on MyChart before the doctors have had a chance to review them. We understand that in some cases there may be results that are confusing or concerning to you. Please understand that not all results are received at the same time and often the doctors may need to interpret multiple results in order to provide you  with the best plan of care or course of treatment. Therefore, we ask that you please give [place] 2 business days to thoroughly review all your results before contacting the office for clarification. Should we see a critical lab result, you will be contacted sooner.   If You Need Anything After Your Visit  If you have any questions or concerns for your doctor, please call our main line at [phone] and press option 4 to reach your doctor's medical assistant. If no one answers, please leave a voicemail as directed and we will return your call as soon as possible. Messages left after 4 pm will be answered the following business day.   You may also send [place] a message via [place]. We typically respond to MyChart messages within 1-2 business days.  For prescription refills, please ask your pharmacy to contact our office. Our fax number is [phone].  If you have an urgent issue when the clinic is closed that cannot wait until the next business day, you can page your doctor at the number below.    Please note that while we do our best to be available for urgent issues outside of office hours, we are not available 24/7.   If you have an urgent issue and are unable to reach [place], you may choose to seek medical care at your doctor's office, retail clinic, urgent care center, or emergency room.  If you have a medical emergency, please immediately call 911 or go to the emergency  department.  Pager Numbers  - Dr. Nehemiah Massed: [phone]  - Dr. Laurence Ferrari: [phone]  - Dr. Nicole Kindred: [phone]  In the event of inclement weather, please call our main line at [phone] for an update on the status of any delays or closures.  Dermatology Medication Tips: Please keep the boxes that topical medications come in in order to help keep track of the instructions about where and how to use these. Pharmacies typically print the medication instructions only on the boxes and not directly on the medication tubes.    If your medication is too expensive, please contact our office at [phone] option 4 or send [place] a message through [place].   We are unable to tell what your co-pay for medications will be in advance as this is different depending on your insurance coverage. However, we may be able to find a substitute medication at lower cost or fill out paperwork to get insurance to cover a needed medication.   If a prior authorization is required to get your medication covered by your insurance company, please allow [place] 1-2 business days to complete this process.  Drug prices often vary depending on where the prescription is filled and some pharmacies may offer cheaper prices.  The website www.goodrx.com contains coupons for medications through different pharmacies. The prices here do not account for what the cost may be with help from insurance (it may be cheaper with your insurance), but the website can give you the price if you did not use any insurance.  - You can print the associated coupon and take it with your prescription to the pharmacy.  - You may also stop by our office during regular business hours and pick up a GoodRx coupon card.  - If you need your prescription sent electronically to a different pharmacy, notify our office through [facility] or by phone at [phone] option 4.     Si Usted Necesita Algo Despus de Su Visita  Tambin puede enviarnos un mensaje a travs de Pharmacist, community. Por lo general respondemos a los mensajes de MyChart en el transcurso de 1 a 2 das hbiles.  Para renovar recetas, por favor pida a su farmacia que se ponga en contacto con nuestra oficina. Harland Dingwall de fax es Advance [phone].  Si tiene un asunto urgente cuando la clnica est cerrada y que no puede esperar hasta el siguiente da hbil, puede llamar/localizar a su doctor(a) al nmero que aparece a continuacin.   Por favor, tenga en cuenta que aunque hacemos todo lo posible para estar  disponibles para asuntos urgentes fuera del horario de [place], no estamos disponibles las 24 horas del da, los 7 das de la [place].   Si tiene un problema urgente y no puede comunicarse con nosotros, puede optar por buscar atencin mdica  en el consultorio de su doctor(a), en una clnica privada, en un centro de atencin urgente o en una sala de emergencias.  Si tiene Engineering geologist, por favor llame inmediatamente al 911 o vaya a la sala de emergencias.  Nmeros de bper  - Dr. Nehemiah Massed: [phone]  - Dra. Moye: [phone]  - Dra. Nicole Kindred: [phone]  En caso de inclemencias del [place], por favor llame a Johnsie Kindred principal al [phone] para una actualizacin sobre el [place] de cualquier retraso o cierre.  Consejos para la medicacin en dermatologa: Por favor, guarde las cajas en las que vienen los medicamentos de uso tpico para ayudarle a seguir las instrucciones sobre dnde y cmo usarlos. Las [employer]  generalmente imprimen las instrucciones del medicamento slo en las cajas y no directamente en los tubos del [place].   Si su medicamento es muy caro, por favor, pngase en contacto con Zigmund Daniel llamando al [phone] y presione la opcin 4 o envenos un mensaje a travs de Pharmacist, community.   No podemos decirle cul ser su copago por los medicamentos por adelantado ya que esto es diferente dependiendo de la cobertura de su seguro. Sin embargo, es posible que podamos encontrar un medicamento sustituto a Electrical engineer un formulario para que el seguro cubra el medicamento que se considera necesario.   Si se requiere una autorizacin previa para que su compaa de seguros [place] su medicamento, por favor permtanos de 1 a 2 das hbiles para completar este proceso.  Los precios de los medicamentos varan con frecuencia dependiendo del Environmental consultant de dnde se surte la receta y alguna farmacias pueden ofrecer precios ms baratos.  El sitio web www.goodrx.com tiene  cupones para medicamentos de Airline pilot. Los precios aqu no tienen en cuenta lo que podra costar con la ayuda del seguro (puede ser ms barato con su seguro), pero el sitio web puede darle el precio si no utiliz Research scientist (physical sciences).  - Puede imprimir el cupn correspondiente y llevarlo con su receta a la farmacia.  - Tambin puede pasar por nuestra oficina durante el horario de atencin regular y Charity fundraiser una tarjeta de cupones de GoodRx.  - Si necesita que su receta se enve electrnicamente a una farmacia diferente, informe a nuestra oficina a travs de MyChart de [place] o por telfono llamando al [phone] y presione la opcin 4.

## 2022-08-19 ENCOUNTER — Encounter: Payer: Self-pay | Admitting: Dermatology

## 2022-08-20 ENCOUNTER — Telehealth: Payer: Self-pay

## 2022-08-20 NOTE — Telephone Encounter (Signed)
-----   Message from Alfonso Patten, MD sent at 08/20/2022  9:09 AM EST ----- 1. Skin , left lower leg superior PIGMENTED SQUAMOUS CELL CARCINOMA IN SITU AND SOLAR LENTIGO --> ED&C at follow-up  2. Skin , left distal calf BASAL CELL CARCINOMA WITH FOCAL SCLEROSIS, SEE DESCRIPTION --> already treated with ED&C, recheck at follow-up  MAs please call. Thank you!

## 2022-08-27 ENCOUNTER — Telehealth: Payer: Self-pay

## 2022-08-27 NOTE — Telephone Encounter (Addendum)
Discussed bx results with patient and treatment. She verbalized understanding and would like to know if measures can be taken to ensure she doesn't feel pain from needle at next follow up. She reports she felt a lot of pain from numbing. Assured patient we would try to make her comfortable as we could.    ----- Message from Alfonso Patten, MD sent at 08/20/2022  9:09 AM EST ----- 1. Skin , left lower leg superior PIGMENTED SQUAMOUS CELL CARCINOMA IN SITU AND SOLAR LENTIGO --> ED&C at follow-up  2. Skin , left distal calf BASAL CELL CARCINOMA WITH FOCAL SCLEROSIS, SEE DESCRIPTION --> already treated with ED&C, recheck at follow-up  MAs please call. Thank you!

## 2022-08-28 NOTE — Telephone Encounter (Signed)
Thank you :)

## 2022-10-06 ENCOUNTER — Telehealth: Payer: Self-pay | Admitting: Internal Medicine

## 2022-10-07 ENCOUNTER — Other Ambulatory Visit: Payer: Self-pay

## 2022-10-07 ENCOUNTER — Ambulatory Visit: Payer: Medicare PPO | Admitting: Internal Medicine

## 2022-10-07 MED ORDER — SERTRALINE HCL 25 MG PO TABS: 25.0000 mg | ORAL_TABLET | Freq: Every day | ORAL | 1 refills | Status: DC

## 2022-10-07 MED ORDER — ESOMEPRAZOLE MAGNESIUM 40 MG PO CPDR: 40.0000 mg | DELAYED_RELEASE_CAPSULE | Freq: Every day | ORAL | 3 refills | Status: DC

## 2022-10-07 NOTE — Telephone Encounter (Signed)
Pt need a refill on sertraline and esomeprazole sent to [facility]

## 2022-10-07 NOTE — Progress Notes (Deleted)
Subjective:    [name], female    DOB: 02-22-1941, 82 y.o.   MRN: [id number]  Patient here for No chief complaint on file.   HPI Here to follow up regarding increased stress, blood pressure and cholesterol.  Previous fall s/p rib fracture 05/2022.  Discussed osteoporosis and treatment.   Restarted zoloft last visit.    Past Medical History:  Diagnosis Date   Basal cell carcinoma 06/21/2020   left post shoulder sup, left mid chest, left lat thigh   Basal cell carcinoma 11/07/2021   L upper back, EDC   Basal cell carcinoma 11/07/2021   R upper back, EDC   Basal cell carcinoma 11/27/2021   right upper arm, EDC   Basal cell carcinoma 11/27/2021   right shoulder posterior, EDC   Basal cell carcinoma 11/27/2021   right anterior shoulder, EDC   Basal cell carcinoma (BCC) 06/13/2021   left dorsal foot, EDC 10/02/2021   Basal cell carcinoma (BCC) 06/13/2021   right postauricular tx'd w/ EDC   BCC (basal cell carcinoma of skin) 03/30/2007   R inf med pretibial - BCC   BCC (basal cell carcinoma of skin) 10/12/2013   L nasal ala - BCC   BCC (basal cell carcinoma of skin) 03/10/2018   L mid dorsum med forearm - superficial BCC    BCC (basal cell carcinoma) 10/02/2021   left dorsal foot proximal, EDC 10/02/2021   BCC (basal cell carcinoma) 10/02/2021   left mid back, superficial EDC 11/07/21   BCC (basal cell carcinoma) 10/02/2021   right pretibia   BCC (basal cell carcinoma) 10/02/2021   right mid back, superficial EDC 11/07/21   BCC (basal cell carcinoma) 08/14/2022   left distal calf, tx'd with EDC   Breast screening, unspecified 2013   Cancer ([place]) 1992   skin   Degenerative disk disease    GERD (gastroesophageal reflux disease)    History of basal cell carcinoma (BCC) 08/29/2020    left inferior knee lateral, , left inferior knee medial, right pretibia   History of SCC (squamous cell carcinoma) of skin 08/29/2020   right posterior calf, left lateral calf,  and    Hypercholesterolemia 2008   Interstitial cystitis    followed by Dr Jacqlyn Larsen   Other sign and symptom in breast 2013   Left upper outer quadrant breast "soreness" Ultrasound exam of right breast in the 2 o'clock position with the breast distracted medially showed a 0.3-0.4 with 0.5 cm simple cyst. In the 1 o'clock position where pt reported tenderness US exam was negatiive.  Because of her history of intermittent nipple drainage, ultrasound was completed of the retroareolar area.   Personal history of tobacco use, presenting hazards to health    PONV (postoperative nausea and vomiting)    SCC (squamous cell carcinoma) 03/28/2008   R dorsum hand - SCC   SCC (squamous cell carcinoma) 05/09/2009   R lat lower leg - SCCIS   SCC (squamous cell carcinoma) 05/09/2009   L lat lower leg - SCCIS   SCC (squamous cell carcinoma) 04/07/2013   L lower leg - SCCIS   SCC (squamous cell carcinoma) 04/27/2013   R forearm - SCC   SCC (squamous cell carcinoma) 04/28/2017   L lat knee - SCCIS   SCC (squamous cell carcinoma) 05/12/2018   L prox dorsum forearm - SCC   SCC (squamous cell carcinoma) 06/13/2021   left forearm tx'd w/ EDC   SCC (squamous cell carcinoma), leg, right  03/30/2007   R sup pretibial - SCCIS   SCC (squamous cell carcinoma);BCC 10/12/2013   Mid back - superficial BCC with SCCIS    Special screening for malignant neoplasms, colon    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   left dorsal forearm, left lat calf   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   left lower leg superior, EDC at follow up   Past Surgical History:  Procedure Laterality Date   ABDOMINAL HYSTERECTOMY  1992   APPENDECTOMY     CERVICAL DISCECTOMY     S/P C7-T1 discectomy with fusion   CHOLECYSTECTOMY  1990   COLONOSCOPY WITH PROPOFOL N/A 02/14/2016   Procedure: COLONOSCOPY WITH PROPOFOL;  Surgeon: Lucilla Lame, MD;  Location: [facility];  Service: Endoscopy;  Laterality: N/A;  PT WOULD  LIKE 10 ARRIVAL TIME OR LATER   DILATION AND CURETTAGE OF UTERUS     ESOPHAGOGASTRODUODENOSCOPY (EGD) WITH PROPOFOL N/A 02/14/2016   Procedure: ESOPHAGOGASTRODUODENOSCOPY (EGD) WITH PROPOFOL with dialtion;  Surgeon: Lucilla Lame, MD;  Location: [facility];  Service: Endoscopy;  Laterality: N/A;   ESOPHAGOGASTRODUODENOSCOPY (EGD) WITH PROPOFOL N/A 04/13/2019   Procedure: ESOPHAGOGASTRODUODENOSCOPY (EGD) WITH PROPOFOL;  Surgeon: Toledo, Benay Pike, MD;  Location: [facility] END[facility];  Service: Gastroenterology;  Laterality: N/A;   MELANOMA EXCISION     removed from Left calf 1994   Family History  Problem Relation Age of Onset   Hodgkin's lymphoma Mother    Heart failure Father    Heart attack Father    Arthritis Sister        Three sisters w/ degeneratve disk disease   Headache Sister        Two sisters hx of headache   Breast cancer Neg Hx    Colon cancer Neg Hx    Bladder Cancer Neg Hx    Kidney cancer Neg Hx    Social History   Socioeconomic History   Marital status: Widowed    Spouse name: Not on file   Number of children: 2   Years of education: Not on file   Highest education level: Not on file  Occupational History   Not on file  Tobacco Use   Smoking status: Former    Packs/day: 1.00    Years: 15.00    Total pack years: 15.00    Types: Cigarettes   Smokeless tobacco: Never  Vaping Use   Vaping Use: Never used  Substance and Sexual Activity   Alcohol use: No    Alcohol/week: 0.0 standard drinks of alcohol   Drug use: No   Sexual activity: Not on file  Other Topics Concern   Not on file  Social History Narrative   Married and has 2 children, daughters.   Social Determinants of Health   Financial Resource Strain: Not on file  Food Insecurity: Not on file  Transportation Needs: Not on file  Physical Activity: Not on file  Stress: Not on file  Social Connections: Not on file     Review of Systems     Objective:     LMP 11/20/1976  Wt Readings  from Last 3 Encounters:  08/07/22 146 lb 3.2 oz (66.3 kg)  06/26/22 150 lb 3.2 oz (68.1 kg)  06/19/22 148 lb (67.1 kg)    Physical Exam   Outpatient Encounter Medications as of 10/07/2022  Medication Sig   acetaminophen (TYLENOL) 325 MG tablet Take 650 mg by mouth as needed.   esomeprazole (NEXIUM) 40 MG capsule Take 1 capsule (40  mg total) by mouth daily.   hydrALAZINE (APRESOLINE) 25 MG tablet Take 1 tablet (25 mg total) by mouth 2 (two) times daily.   imipramine (TOFRANIL) 10 MG tablet Take 1 tablet (10 mg total) by mouth at bedtime.   lovastatin (MEVACOR) 20 MG tablet Take 1 tablet (20 mg total) by mouth at bedtime.   nystatin cream (MYCOSTATIN) Apply 1 application topically 2 (two) times daily.   sertraline (ZOLOFT) 25 MG tablet Take 1 tablet (25 mg total) by mouth daily.   triamcinolone cream (KENALOG) 0.1 % Apply 1 application topically 2 (two) times daily as needed.   trospium (SANCTURA) 20 MG tablet Take 1 tablet (20 mg total) by mouth daily.   No facility-administered encounter medications on file as of 10/07/2022.     Lab Results  Component Value Date   WBC 4.9 06/05/2022   HGB 12.9 06/05/2022   HCT 38.7 06/05/2022   PLT 227.0 06/05/2022   GLUCOSE 97 06/05/2022   CHOL 173 06/05/2022   TRIG 62.0 06/05/2022   HDL 76.60 06/05/2022   LDLCALC 84 06/05/2022   ALT 10 06/05/2022   AST 16 06/05/2022   NA 139 06/05/2022   K 4.1 06/05/2022   CL 103 06/05/2022   CREATININE 1.03 06/05/2022   BUN 10 06/05/2022   CO2 28 06/05/2022   TSH 5.42 06/05/2022   HGBA1C 5.9 06/05/2022    MM 3D SCREEN BREAST BILATERAL  Result Date: 05/01/2022 CLINICAL DATA:  Screening. EXAM: DIGITAL SCREENING BILATERAL MAMMOGRAM WITH TOMOSYNTHESIS AND CAD TECHNIQUE: Bilateral screening digital craniocaudal and mediolateral oblique mammograms were obtained. Bilateral screening digital breast tomosynthesis was performed. The images were evaluated with computer-aided detection. COMPARISON:  Previous  exam(s). ACR Breast Density Category b: There are scattered areas of fibroglandular density. FINDINGS: There are no findings suspicious for malignancy. IMPRESSION: No mammographic evidence of malignancy. A result letter of this screening mammogram will be mailed directly to the patient. RECOMMENDATION: Screening mammogram in one year. (Code:SM-B-01Y) BI-RADS CATEGORY  1: Negative. Electronically Signed   By: Nolon Nations M.D.   On: 05/01/2022 14:20       Assessment & Plan:  There are no diagnoses linked to this encounter.   Einar Pheasant, MD

## 2022-10-07 NOTE — Telephone Encounter (Signed)
sent 

## 2022-10-22 ENCOUNTER — Other Ambulatory Visit: Payer: Self-pay

## 2022-10-22 ENCOUNTER — Telehealth: Payer: Self-pay | Admitting: Internal Medicine

## 2022-10-22 MED ORDER — SERTRALINE HCL 50 MG PO TABS: 50.0000 mg | ORAL_TABLET | Freq: Every day | ORAL | 0 refills | Status: DC

## 2022-10-22 NOTE — Telephone Encounter (Signed)
Patient feels like she did better on the higher dose of zoloft. She says the 25 mg is not working anymore and is starting to disrupt her sleeping and anxiety is worse. Per 07/2022 phone note you were wanting to start her back on 25 mg of zoloft (I'm assuming she stopped taking). Note stated can titrate up if needed. Are you ok with sending in increased dose for her?

## 2022-10-22 NOTE — Telephone Encounter (Signed)
Pt would like to be called in regards to her medication-sertraline

## 2022-10-22 NOTE — Telephone Encounter (Signed)
If she has been taking zoloft '25mg'$  q day and feels needs to increase dose, ok to increase to '50mg'$  q day.  Will need rx sent in with directions. Keep [place] posted.

## 2022-10-22 NOTE — Telephone Encounter (Signed)
Rx sent in for sertraline 50 mg q day. Pt aware and has f/u ap[pt on 2/6. Will let us know if problems prior to appt.

## 2022-10-23 ENCOUNTER — Ambulatory Visit: Payer: Medicare PPO | Admitting: Dermatology

## 2022-10-23 DIAGNOSIS — R21 Rash and other nonspecific skin eruption: Secondary | ICD-10-CM

## 2022-10-23 DIAGNOSIS — Z86007 Personal history of in-situ neoplasm of skin: Secondary | ICD-10-CM

## 2022-10-23 DIAGNOSIS — D492 Neoplasm of unspecified behavior of bone, soft tissue, and skin: Secondary | ICD-10-CM

## 2022-10-23 DIAGNOSIS — L570 Actinic keratosis: Secondary | ICD-10-CM | POA: Diagnosis not present

## 2022-10-23 MED ORDER — TRIAMCINOLONE ACETONIDE 0.1 % EX OINT: TOPICAL_OINTMENT | CUTANEOUS | Status: DC

## 2022-10-23 MED ORDER — TRIAMCINOLONE ACETONIDE 0.025 % EX OINT: TOPICAL_OINTMENT | CUTANEOUS | 0 refills | Status: DC

## 2022-10-23 NOTE — Progress Notes (Signed)
Follow-Up Visit   Subjective  Rachael Austin is a 82 y.o. female who presents for the following: Procedure (Patient here today for biopsies and EDC at left upper arm, chest right of midline and mid back right of midline. She is concerned about a spot at left calf and right lower leg. ).   The following portions of the chart were reviewed this encounter and updated as appropriate:   Tobacco  Allergies  Meds  Problems  Med Hx  Surg Hx  Fam Hx      Review of Systems:  No other skin or systemic complaints except as noted in HPI or Assessment and Plan.  Objective  Well appearing patient in no apparent distress; mood and affect are within normal limits.  A focused examination was performed including legs. Relevant physical exam findings are noted in the Assessment and Plan.  Left Upper Arm 0.1 cm very thin scaly pink papule R/o BCC  chest right of midline 0.5 cm scaly pink papule R/o BCC  Mid Back right of midline 2.4 cm thin scaly pink patch R/o BCC  left lateral pretibia 1.7 cm scaly pink plaque adjacent to scar R/o recurrence SCC or BCC (will review past photos and see what treated in the past)       hypertrophic at right distal medial calf x 1 Erythematous thin papules/macules with gritty scale.   left lower leg superior Crusted plaque     Right Lower Leg - Anterior 1.9 cm scaly pink plaque without features suspicious for malignancy on dermoscopy    Assessment & Plan  Neoplasm of skin (4) Left Upper Arm  chest right of midline  Mid Back right of midline  left lateral pretibia  Will biopsy and treat with EDC at follow up left upper arm, chest right of midline, mid back right of midline.   Biopsy deferred at left lateral pretibia today. Will biopsy and EDC at follow up.       AK (actinic keratosis) hypertrophic at right distal medial calf x 1  Actinic keratoses are precancerous spots that appear secondary to cumulative UV radiation  exposure/sun exposure over time. They are chronic with expected duration over 1 year. A portion of actinic keratoses will progress to squamous cell carcinoma of the skin. It is not possible to reliably predict which spots will progress to skin cancer and so treatment is recommended to prevent development of skin cancer.  Recommend daily broad spectrum sunscreen SPF 30+ to sun-exposed areas, reapply every 2 hours as needed.  Recommend staying in the shade or wearing long sleeves, sun glasses (UVA+UVB protection) and wide brim hats (4-inch brim around the entire circumference of the hat). Call for new or changing lesions.  Prior to procedure, discussed risks of blister formation, small wound, skin dyspigmentation, or rare scar following cryotherapy. Recommend Vaseline ointment to treated areas while healing.   Destruction of lesion - hypertrophic at right distal medial calf x 1  Destruction method: cryotherapy   Informed consent: discussed and consent obtained   Lesion destroyed using liquid nitrogen: Yes   Cryotherapy cycles:  2 Outcome: patient tolerated procedure well with no complications   Post-procedure details: wound care instructions given    History of squamous cell carcinoma in situ (SCCIS) of skin left lower leg superior  Exam C/w not fully healed wound   Area soaked with saline, numbed with lido w/epi and scab removed. Vaseline, non-stick gauze applied and wrapped with coban.   Recheck at f/u  Rash  and other nonspecific skin eruption Right Lower Leg - Anterior  Dermatitis > SCCis  Samples of [place] given to patient to use at right medial calf. Will send in [facility] 0.1% ointment for patient to use twice daily for up to 2 weeks. Avoid applying to face, groin, and axilla. Use as directed. Long-term use can cause thinning of the skin. Favor inflammatory > SCCis  Recheck in 2-3 weeks   Return in about 2 weeks (around 11/06/2022) for Biopsies, EDC.  Graciella Belton, RMA, am  acting as scribe for Forest Gleason, MD .   Documentation: I have reviewed the above documentation for accuracy and completeness, and I agree with the above.  Forest Gleason, MD

## 2022-10-23 NOTE — Patient Instructions (Signed)
Wound Care Instructions  Cleanse wound gently with soap and water once a day then pat dry with clean gauze. Apply a thin coat of Petrolatum (petroleum jelly, "Vaseline") over the wound (unless you have an allergy to this). We recommend that you use a new, sterile tube of Vaseline. Do not pick or remove scabs. Do not remove the yellow or white "healing tissue" from the base of the wound.  Cover the wound with fresh, clean, nonstick gauze and secure with paper tape. You may use Band-Aids in place of gauze and tape if the wound is small enough, but would recommend trimming much of the tape off as there is often too much. Sometimes Band-Aids can irritate the skin.  You should call the office for your biopsy report after 1 week if you have not already been contacted.  If you experience any problems, such as abnormal amounts of bleeding, swelling, significant bruising, significant pain, or evidence of infection, please call the office immediately.  FOR ADULT SURGERY PATIENTS: If you need something for pain relief you may take 1 extra strength Tylenol (acetaminophen) AND 2 Ibuprofen (200mg each) together every 4 hours as needed for pain. (do not take these if you are allergic to them or if you have a reason you should not take them.) Typically, you may only need pain medication for 1 to 3 days.     Due to recent changes in healthcare laws, you may see results of your pathology and/or laboratory studies on MyChart before the doctors have had a chance to review them. We understand that in some cases there may be results that are confusing or concerning to you. Please understand that not all results are received at the same time and often the doctors may need to interpret multiple results in order to provide you with the best plan of care or course of treatment. Therefore, we ask that you please give us 2 business days to thoroughly review all your results before contacting the office for clarification. Should  we see a critical lab result, you will be contacted sooner.   If You Need Anything After Your Visit  If you have any questions or concerns for your doctor, please call our main line at [phone] and press option 4 to reach your doctor's medical assistant. If no one answers, please leave a voicemail as directed and we will return your call as soon as possible. Messages left after 4 pm will be answered the following business day.   You may also send us a message via MyChart. We typically respond to MyChart messages within 1-2 business days.  For prescription refills, please ask your pharmacy to contact our office. Our fax number is [phone].  If you have an urgent issue when the clinic is closed that cannot wait until the next business day, you can page your doctor at the number below.    Please note that while we do our best to be available for urgent issues outside of office hours, we are not available 24/7.   If you have an urgent issue and are unable to reach us, you may choose to seek medical care at your doctor's office, retail clinic, urgent care center, or emergency room.  If you have a medical emergency, please immediately call 911 or go to the emergency department.  Pager Numbers  - Dr. Kowalski: [phone]  - Dr. Moye: [phone]  - Dr. Stewart: [phone]  In the event of inclement weather, please call our main line at   [phone] for an update on the status of any delays or closures.  Dermatology Medication Tips: Please keep the boxes that topical medications come in in order to help keep track of the instructions about where and how to use these. Pharmacies typically print the medication instructions only on the boxes and not directly on the medication tubes.   If your medication is too expensive, please contact our office at [phone] option 4 or send us a message through MyChart.   We are unable to tell what your co-pay for medications will be in  advance as this is different depending on your insurance coverage. However, we may be able to find a substitute medication at lower cost or fill out paperwork to get insurance to cover a needed medication.   If a prior authorization is required to get your medication covered by your insurance company, please allow us 1-2 business days to complete this process.  Drug prices often vary depending on where the prescription is filled and some pharmacies may offer cheaper prices.  The website www.goodrx.com contains coupons for medications through different pharmacies. The prices here do not account for what the cost may be with help from insurance (it may be cheaper with your insurance), but the website can give you the price if you did not use any insurance.  - You can print the associated coupon and take it with your prescription to the pharmacy.  - You may also stop by our office during regular business hours and pick up a GoodRx coupon card.  - If you need your prescription sent electronically to a different pharmacy, notify our office through [facility] MyChart or by phone at [phone] option 4.     Si Usted Necesita Algo Despus de Su Visita  Tambin puede enviarnos un mensaje a travs de MyChart. Por lo general respondemos a los mensajes de MyChart en el transcurso de 1 a 2 das hbiles.  Para renovar recetas, por favor pida a su farmacia que se ponga en contacto con nuestra oficina. Nuestro nmero de fax es el [phone].  Si tiene un asunto urgente cuando la clnica est cerrada y que no puede esperar hasta el siguiente da hbil, puede llamar/localizar a su doctor(a) al nmero que aparece a continuacin.   Por favor, tenga en cuenta que aunque hacemos todo lo posible para estar disponibles para asuntos urgentes fuera del horario de oficina, no estamos disponibles las 24 horas del da, los 7 das de la semana.   Si tiene un problema urgente y no puede comunicarse con nosotros, puede  optar por buscar atencin mdica  en el consultorio de su doctor(a), en una clnica privada, en un centro de atencin urgente o en una sala de emergencias.  Si tiene una emergencia mdica, por favor llame inmediatamente al 911 o vaya a la sala de emergencias.  Nmeros de bper  - Dr. Kowalski: [phone]  - Dra. Moye: [phone]  - Dra. Stewart: [phone]  En caso de inclemencias del tiempo, por favor llame a nuestra lnea principal al [phone] para una actualizacin sobre el estado de cualquier retraso o cierre.  Consejos para la medicacin en dermatologa: Por favor, guarde las cajas en las que vienen los medicamentos de uso tpico para ayudarle a seguir las instrucciones sobre dnde y cmo usarlos. Las farmacias generalmente imprimen las instrucciones del medicamento slo en las cajas y no directamente en los tubos del medicamento.   Si su medicamento es muy caro, por favor, pngase en contacto con   nuestra oficina llamando al [phone] y presione la opcin 4 o envenos un mensaje a travs de MyChart.   No podemos decirle cul ser su copago por los medicamentos por adelantado ya que esto es diferente dependiendo de la cobertura de su seguro. Sin embargo, es posible que podamos encontrar un medicamento sustituto a menor costo o llenar un formulario para que el seguro cubra el medicamento que se considera necesario.   Si se requiere una autorizacin previa para que su compaa de seguros cubra su medicamento, por favor permtanos de 1 a 2 das hbiles para completar este proceso.  Los precios de los medicamentos varan con frecuencia dependiendo del lugar de dnde se surte la receta y alguna farmacias pueden ofrecer precios ms baratos.  El sitio web www.goodrx.com tiene cupones para medicamentos de diferentes farmacias. Los precios aqu no tienen en cuenta lo que podra costar con la ayuda del seguro (puede ser ms barato con su seguro), pero el sitio web puede darle el  precio si no utiliz ningn seguro.  - Puede imprimir el cupn correspondiente y llevarlo con su receta a la farmacia.  - Tambin puede pasar por nuestra oficina durante el horario de atencin regular y recoger una tarjeta de cupones de GoodRx.  - Si necesita que su receta se enve electrnicamente a una farmacia diferente, informe a nuestra oficina a travs de MyChart de [facility] o por telfono llamando al [phone] y presione la opcin 4.  

## 2022-10-27 ENCOUNTER — Encounter: Payer: Self-pay | Admitting: Dermatology

## 2022-11-04 ENCOUNTER — Ambulatory Visit: Payer: Medicare PPO | Admitting: Internal Medicine

## 2022-11-04 ENCOUNTER — Encounter: Payer: Self-pay | Admitting: Internal Medicine

## 2022-11-04 VITALS — BP 134/78 | HR 86 | Temp 98.2°F | Resp 16 | Ht 64.0 in | Wt 146.8 lb

## 2022-11-04 DIAGNOSIS — F439 Reaction to severe stress, unspecified: Secondary | ICD-10-CM | POA: Diagnosis not present

## 2022-11-04 DIAGNOSIS — E7800 Pure hypercholesterolemia, unspecified: Secondary | ICD-10-CM | POA: Diagnosis not present

## 2022-11-04 DIAGNOSIS — R739 Hyperglycemia, unspecified: Secondary | ICD-10-CM

## 2022-11-04 DIAGNOSIS — K219 Gastro-esophageal reflux disease without esophagitis: Secondary | ICD-10-CM | POA: Diagnosis not present

## 2022-11-04 DIAGNOSIS — M810 Age-related osteoporosis without current pathological fracture: Secondary | ICD-10-CM | POA: Diagnosis not present

## 2022-11-04 DIAGNOSIS — Z85820 Personal history of malignant melanoma of skin: Secondary | ICD-10-CM

## 2022-11-04 DIAGNOSIS — I10 Essential (primary) hypertension: Secondary | ICD-10-CM | POA: Diagnosis not present

## 2022-11-04 DIAGNOSIS — N3010 Interstitial cystitis (chronic) without hematuria: Secondary | ICD-10-CM

## 2022-11-04 DIAGNOSIS — I700 Atherosclerosis of aorta: Secondary | ICD-10-CM | POA: Diagnosis not present

## 2022-11-04 LAB — HEPATIC FUNCTION PANEL
ALT: 8 U/L (ref 0–35)
AST: 12 U/L (ref 0–37)
Albumin: 4.4 g/dL (ref 3.5–5.2)
Alkaline Phosphatase: 75 U/L (ref 39–117)
Bilirubin, Direct: 0.1 mg/dL (ref 0.0–0.3)
Total Bilirubin: 0.6 mg/dL (ref 0.2–1.2)
Total Protein: 6.5 g/dL (ref 6.0–8.3)

## 2022-11-04 LAB — BASIC METABOLIC PANEL
BUN: 6 mg/dL (ref 6–23)
CO2: 26 mEq/L (ref 19–32)
Calcium: 8.8 mg/dL (ref 8.4–10.5)
Chloride: 99 mEq/L (ref 96–112)
Creatinine, Ser: 0.88 mg/dL (ref 0.40–1.20)
GFR: 61.03 mL/min (ref >60.00)
Glucose, Bld: 97 mg/dL (ref 70–99)
Potassium: 4.2 mEq/L (ref 3.5–5.1)
Sodium: 136 mEq/L (ref 135–145)

## 2022-11-04 LAB — TSH: TSH: 4.5 u[IU]/mL (ref 0.35–5.50)

## 2022-11-04 LAB — LIPID PANEL
Cholesterol: 178 mg/dL (ref 0–200)
HDL: 72.9 mg/dL (ref >39.00)
LDL Cholesterol: 86 mg/dL (ref 0–99)
NonHDL: 105.59
Total CHOL/HDL Ratio: 2
Triglycerides: 96 mg/dL (ref 0.0–149.0)
VLDL: 19.2 mg/dL (ref 0.0–40.0)

## 2022-11-04 LAB — HEMOGLOBIN A1C: Hgb A1c MFr Bld: 5.7 % (ref 4.6–6.5)

## 2022-11-04 NOTE — Progress Notes (Signed)
Subjective:    [name], female    DOB: [birth year], 82 y.o.   MRN: [id number]  Patient here for  Chief Complaint  Patient presents with   Medical Management of Chronic Issues    HPI Here to follow up regarding hypertension, hyperglycemia and hypercholesterolemia.  Started back on zoloft - after last visit.  Appears to be doing better. Tries to stay active.  No chest pain or sob reported.  No abdominal pain or bowel change reported.  Leg lesions - seeing dermatology next week.     Past Medical History:  Diagnosis Date   Basal cell carcinoma 06/21/2020   left post shoulder sup, left mid chest, left lat thigh   Basal cell carcinoma 11/07/2021   L upper back, EDC   Basal cell carcinoma 11/07/2021   R upper back, EDC   Basal cell carcinoma 11/27/2021   right upper arm, EDC   Basal cell carcinoma 11/27/2021   right shoulder posterior, EDC   Basal cell carcinoma 11/27/2021   right anterior shoulder, EDC   Basal cell carcinoma (BCC) 06/13/2021   left dorsal foot, EDC 10/02/2021   Basal cell carcinoma (BCC) 06/13/2021   right postauricular tx'd w/ EDC   BCC (basal cell carcinoma of skin) 03/30/2007   R inf med pretibial - BCC   BCC (basal cell carcinoma of skin) 10/12/2013   L nasal ala - BCC   BCC (basal cell carcinoma of skin) 03/10/2018   L mid dorsum med forearm - superficial BCC    BCC (basal cell carcinoma) 10/02/2021   left dorsal foot proximal, EDC 10/02/2021   BCC (basal cell carcinoma) 10/02/2021   left mid back, superficial EDC 11/07/21   BCC (basal cell carcinoma) 10/02/2021   right pretibia   BCC (basal cell carcinoma) 10/02/2021   right mid back, superficial EDC 11/07/21   BCC (basal cell carcinoma) 08/14/2022   left distal calf, tx'd with EDC   Breast screening, unspecified 2013   Cancer ([place]) 1992   skin   Degenerative disk disease    GERD (gastroesophageal reflux disease)    History of basal cell carcinoma (BCC) 08/29/2020    left inferior  knee lateral, , left inferior knee medial, right pretibia   History of SCC (squamous cell carcinoma) of skin 08/29/2020   right posterior calf, left lateral calf, and    Hypercholesterolemia 2008   Interstitial cystitis    followed by Dr Jacqlyn Larsen   Other sign and symptom in breast 2013   Left upper outer quadrant breast "soreness" Ultrasound exam of right breast in the 2 o'clock position with the breast distracted medially showed a 0.3-0.4 with 0.5 cm simple cyst. In the 1 o'clock position where pt reported tenderness US exam was negatiive.  Because of her history of intermittent nipple drainage, ultrasound was completed of the retroareolar area.   Personal history of tobacco use, presenting hazards to health    PONV (postoperative nausea and vomiting)    SCC (squamous cell carcinoma) 03/28/2008   R dorsum hand - SCC   SCC (squamous cell carcinoma) 05/09/2009   R lat lower leg - SCCIS   SCC (squamous cell carcinoma) 05/09/2009   L lat lower leg - SCCIS   SCC (squamous cell carcinoma) 04/07/2013   L lower leg - SCCIS   SCC (squamous cell carcinoma) 04/27/2013   R forearm - SCC   SCC (squamous cell carcinoma) 04/28/2017   L lat knee - SCCIS   SCC (squamous  cell carcinoma) 05/12/2018   L prox dorsum forearm - SCC   SCC (squamous cell carcinoma) 06/13/2021   left forearm tx'd w/ EDC   SCC (squamous cell carcinoma), leg, right 03/30/2007   R sup pretibial - SCCIS   SCC (squamous cell carcinoma);BCC 10/12/2013   Mid back - superficial BCC with SCCIS    Special screening for malignant neoplasms, colon    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   left dorsal forearm, left lat calf   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   left lower leg superior, EDC at follow up   Past Surgical History:  Procedure Laterality Date   ABDOMINAL HYSTERECTOMY  1992   APPENDECTOMY     CERVICAL DISCECTOMY     S/P C7-T1 discectomy with fusion   CHOLECYSTECTOMY  1990   COLONOSCOPY WITH PROPOFOL  N/A 02/14/2016   Procedure: COLONOSCOPY WITH PROPOFOL;  Surgeon: Lucilla Lame, MD;  Location: [place] [facility];  Service: Endoscopy;  Laterality: N/A;  PT WOULD LIKE 10 ARRIVAL TIME OR LATER   DILATION AND CURETTAGE OF UTERUS     ESOPHAGOGASTRODUODENOSCOPY (EGD) WITH PROPOFOL N/A 02/14/2016   Procedure: ESOPHAGOGASTRODUODENOSCOPY (EGD) WITH PROPOFOL with dialtion;  Surgeon: Lucilla Lame, MD;  Location: [place];  Service: Endoscopy;  Laterality: N/A;   ESOPHAGOGASTRODUODENOSCOPY (EGD) WITH PROPOFOL N/A 04/13/2019   Procedure: ESOPHAGOGASTRODUODENOSCOPY (EGD) WITH PROPOFOL;  Surgeon: Toledo, Benay Pike, MD;  Location: [facility];  Service: Gastroenterology;  Laterality: N/A;   MELANOMA EXCISION     removed from Left calf 1994   Family History  Problem Relation Age of Onset   Hodgkin's lymphoma Mother    Heart failure Father    Heart attack Father    Arthritis Sister        Three sisters w/ degeneratve disk disease   Headache Sister        Two sisters hx of headache   Breast cancer Neg Hx    Colon cancer Neg Hx    Bladder Cancer Neg Hx    Kidney cancer Neg Hx    Social History   Socioeconomic History   Marital status: Widowed    Spouse name: Not on file   Number of children: 2   Years of education: Not on file   Highest education level: Not on file  Occupational History   Not on file  Tobacco Use   Smoking status: Former    Packs/day: 1.00    Years: 15.00    Total pack years: 15.00    Types: Cigarettes   Smokeless tobacco: Never  Vaping Use   Vaping Use: Never used  Substance and Sexual Activity   Alcohol use: No    Alcohol/week: 0.0 standard drinks of alcohol   Drug use: No   Sexual activity: Not on file  Other Topics Concern   Not on file  Social History Narrative   Married and has 2 children, daughters.   Social Determinants of Health   Financial Resource Strain: Not on file  Food Insecurity: Not on file  Transportation Needs: Not on file   Physical Activity: Not on file  Stress: Not on file  Social Connections: Not on file     Review of Systems  Constitutional:  Negative for appetite change and unexpected weight change.  HENT:  Negative for congestion and sinus pressure.   Respiratory:  Negative for cough, chest tightness and shortness of breath.   Cardiovascular:  Negative for chest pain, palpitations and leg swelling.  Gastrointestinal:  Negative for abdominal pain, diarrhea, nausea and vomiting.  Genitourinary:  Negative for difficulty urinating and dysuria.  Musculoskeletal:  Negative for joint swelling and myalgias.  Skin:  Negative for color change and rash.  Neurological:  Negative for dizziness and headaches.  Psychiatric/Behavioral:  Negative for agitation and dysphoric mood.        Objective:     BP 134/78   Pulse 86   Temp 98.2 F (36.8 C)   Resp 16   Ht 5' 4"$  (1.626 m)   Wt 146 lb 12.8 oz (66.6 kg)   LMP 11/20/1976   SpO2 99%   BMI 25.20 kg/m  Wt Readings from Last 3 Encounters:  11/04/22 146 lb 12.8 oz (66.6 kg)  08/07/22 146 lb 3.2 oz (66.3 kg)  06/26/22 150 lb 3.2 oz (68.1 kg)    Physical Exam Vitals reviewed.  Constitutional:      General: She is not in acute distress.    Appearance: Normal appearance.  HENT:     Head: Normocephalic and atraumatic.     Right Ear: External ear normal.     Left Ear: External ear normal.  Eyes:     General: No scleral icterus.       Right eye: No discharge.        Left eye: No discharge.     Conjunctiva/sclera: Conjunctivae normal.  Neck:     Thyroid: No thyromegaly.  Cardiovascular:     Rate and Rhythm: Normal rate and regular rhythm.  Pulmonary:     Effort: No respiratory distress.     Breath sounds: Normal breath sounds. No wheezing.  Abdominal:     General: Bowel sounds are normal.     Palpations: Abdomen is soft.     Tenderness: There is no abdominal tenderness.  Musculoskeletal:        General: No swelling or tenderness.      Cervical back: Neck supple. No tenderness.  Lymphadenopathy:     Cervical: No cervical adenopathy.  Skin:    Findings: No erythema or rash.  Neurological:     Mental Status: She is alert.  Psychiatric:        Mood and Affect: Mood normal.        Behavior: Behavior normal.      Outpatient Encounter Medications as of 11/04/2022  Medication Sig   acetaminophen (TYLENOL) 325 MG tablet Take 650 mg by mouth as needed.   esomeprazole (NEXIUM) 40 MG capsule Take 1 capsule (40 mg total) by mouth daily.   hydrALAZINE (APRESOLINE) 25 MG tablet Take 1 tablet (25 mg total) by mouth 2 (two) times daily.   imipramine (TOFRANIL) 10 MG tablet Take 1 tablet (10 mg total) by mouth at bedtime.   lovastatin (MEVACOR) 20 MG tablet Take 1 tablet (20 mg total) by mouth at bedtime.   nystatin cream (MYCOSTATIN) Apply 1 application topically 2 (two) times daily.   sertraline (ZOLOFT) 50 MG tablet Take 1 tablet (50 mg total) by mouth daily.   triamcinolone ointment (KENALOG) 0.1 % Apply to affected area at right lower leg twice daily for up to 2 weeks as needed. Avoid applying to face, groin, and axilla. Use as directed. Long-term use can cause thinning of the skin.   trospium (SANCTURA) 20 MG tablet Take 1 tablet (20 mg total) by mouth daily.   No facility-administered encounter medications on file as of 11/04/2022.     Lab Results  Component Value Date   WBC 4.9 06/05/2022   HGB  12.9 06/05/2022   HCT 38.7 06/05/2022   PLT 227.0 06/05/2022   GLUCOSE 97 11/04/2022   CHOL 178 11/04/2022   TRIG 96.0 11/04/2022   HDL 72.90 11/04/2022   LDLCALC 86 11/04/2022   ALT 8 11/04/2022   AST 12 11/04/2022   NA 136 11/04/2022   K 4.2 11/04/2022   CL 99 11/04/2022   CREATININE 0.88 11/04/2022   BUN 6 11/04/2022   CO2 26 11/04/2022   TSH 4.50 11/04/2022   HGBA1C 5.7 11/04/2022    MM 3D SCREEN BREAST BILATERAL  Result Date: 05/01/2022 CLINICAL DATA:  Screening. EXAM: DIGITAL SCREENING BILATERAL MAMMOGRAM WITH  TOMOSYNTHESIS AND CAD TECHNIQUE: Bilateral screening digital craniocaudal and mediolateral oblique mammograms were obtained. Bilateral screening digital breast tomosynthesis was performed. The images were evaluated with computer-aided detection. COMPARISON:  Previous exam(s). ACR Breast Density Category b: There are scattered areas of fibroglandular density. FINDINGS: There are no findings suspicious for malignancy. IMPRESSION: No mammographic evidence of malignancy. A result letter of this screening mammogram will be mailed directly to the patient. RECOMMENDATION: Screening mammogram in one year. (Code:SM-B-01Y) BI-RADS CATEGORY  1: Negative. Electronically Signed   By: Nolon Nations M.D.   On: 05/01/2022 14:20       Assessment & Plan:  Essential hypertension Assessment & Plan: Intolerant to amlodipine.  On hydralazine.  Follow pressures.  Follow metabolic panel.   Orders: -     Basic metabolic panel -     Hepatic function panel  Hypercholesterolemia Assessment & Plan: Continue mevacor.  Low cholesterol diet and exercise.  Follow lipid panel and liver function tests.   Orders: -     TSH -     Lipid panel  Hyperglycemia Assessment & Plan: Low carb diet and exercise. Follow met b and a1c.   Orders: -     Basic metabolic panel -     Hemoglobin A1c  Aortic atherosclerosis (HCC) Assessment & Plan: Continue mevacor.    Chronic interstitial cystitis Assessment & Plan: Followed by urology.  On imipramine and sanctura.    Gastroesophageal reflux disease, unspecified whether esophagitis present Assessment & Plan: No upper symptoms reported.  Continue nexium.    H/O Malignant melanoma Assessment & Plan: Followed by dermatology.  Has f/u planned next week.    Osteoporosis, unspecified osteoporosis type, unspecified pathological fracture presence Assessment & Plan: Have discussed bone density and treatment options.  Discussed reclast and prolia.  Given her GI history, wants  to avoid oral bisphosphonates.  She will notify me what she decides.    Stress Assessment & Plan: Continue zoloft.  Follow.        Einar Pheasant, MD

## 2022-11-12 ENCOUNTER — Other Ambulatory Visit: Payer: Self-pay | Admitting: Dermatology

## 2022-11-12 ENCOUNTER — Ambulatory Visit: Payer: Medicare PPO | Admitting: Dermatology

## 2022-11-12 DIAGNOSIS — D045 Carcinoma in situ of skin of trunk: Secondary | ICD-10-CM | POA: Diagnosis not present

## 2022-11-12 DIAGNOSIS — D492 Neoplasm of unspecified behavior of bone, soft tissue, and skin: Secondary | ICD-10-CM

## 2022-11-12 DIAGNOSIS — L570 Actinic keratosis: Secondary | ICD-10-CM

## 2022-11-12 DIAGNOSIS — C44719 Basal cell carcinoma of skin of left lower limb, including hip: Secondary | ICD-10-CM | POA: Diagnosis not present

## 2022-11-12 DIAGNOSIS — L980 Pyogenic granuloma: Secondary | ICD-10-CM | POA: Diagnosis not present

## 2022-11-12 DIAGNOSIS — C44519 Basal cell carcinoma of skin of other part of trunk: Secondary | ICD-10-CM

## 2022-11-12 HISTORY — DX: Actinic keratosis: L57.0

## 2022-11-12 NOTE — Progress Notes (Signed)
Follow-Up Visit   Subjective  Rachael Austin is a 82 y.o. female who presents for the following: Procedure (Patient here today for biopsies and EDC. ).  The following portions of the chart were reviewed this encounter and updated as appropriate:   Tobacco  Allergies  Meds  Problems  Med Hx  Surg Hx  Fam Hx      Review of Systems:  No other skin or systemic complaints except as noted in HPI or Assessment and Plan.  Objective  Well appearing patient in no apparent distress; mood and affect are within normal limits.  A focused examination was performed including face, neck, chest and back and arms, legs. Relevant physical exam findings are noted in the Assessment and Plan.  Left Lateral Pretibia - C 1.7 cm scaly pink plaque adjacent to scar R/o recurrence SCC or BCC     Chest Right of Midline - B 0.5 cm scaly pink papule R/o BCC     Left Upper Arm - A 0.1 cm very thin scaly pink papule R/o BCC       Mid Back Right of Midline - E 2.4 cm thin scaly pink patch R/o BCC     Right Medial Pretibia - D 2.2 cm scaly pink plaque  R/o SCCis vs Dermatitis worsened with topical steroid       Assessment & Plan  Neoplasm of skin (5) Left Lateral Pretibia - C  Epidermal / dermal shaving  Lesion diameter (cm):  1.7 Informed consent: discussed and consent obtained   Timeout: patient name, date of birth, surgical site, and procedure verified   Anesthesia: the lesion was anesthetized in a standard fashion   Anesthetic:  1% lidocaine w/ epinephrine 1-100,000 local infiltration Instrument used: DermaBlade   Hemostasis achieved with: aluminum chloride   Outcome: patient tolerated procedure well   Post-procedure details: wound care instructions given   Additional details:  Mupirocin and a bandage applied  Destruction of lesion  Destruction method: electrodesiccation and curettage   Informed consent: discussed and consent obtained   Timeout:  patient  name, date of birth, surgical site, and procedure verified Anesthesia: the lesion was anesthetized in a standard fashion   Anesthetic:  1% lidocaine w/ epinephrine 1-100,000 buffered w/ 8.4% NaHCO3 Curettage performed in three different directions: Yes   Electrodesiccation performed over the curetted area: Yes   Curettage cycles:  3 Final wound size (cm):  1.7 Hemostasis achieved with:  electrodesiccation Outcome: patient tolerated procedure well with no complications   Post-procedure details: sterile dressing applied and wound care instructions given   Dressing type: petrolatum    Specimen 1 - Surgical pathology Differential Diagnosis: R/o recurrence SCC or BCC  Check Margins: No 1.7 cm scaly pink plaque adjacent to scar   Chest Right of Midline - B  Epidermal / dermal shaving  Lesion diameter (cm):  0.5 Informed consent: discussed and consent obtained   Timeout: patient name, date of birth, surgical site, and procedure verified   Anesthesia: the lesion was anesthetized in a standard fashion   Anesthetic:  1% lidocaine w/ epinephrine 1-100,000 local infiltration Instrument used: DermaBlade   Hemostasis achieved with: aluminum chloride   Outcome: patient tolerated procedure well   Post-procedure details: wound care instructions given   Additional details:  Mupirocin and a bandage applied  Destruction of lesion  Destruction method: electrodesiccation and curettage   Informed consent: discussed and consent obtained   Timeout:  patient name, date of birth, surgical site, and procedure verified  Anesthesia: the lesion was anesthetized in a standard fashion   Anesthetic:  1% lidocaine w/ epinephrine 1-100,000 buffered w/ 8.4% NaHCO3 Curettage performed in three different directions: Yes   Electrodesiccation performed over the curetted area: Yes   Curettage cycles:  3 Final wound size (cm):  0.9 Hemostasis achieved with:  electrodesiccation Outcome: patient tolerated procedure  well with no complications   Post-procedure details: sterile dressing applied and wound care instructions given   Dressing type: petrolatum    Specimen 2 - Surgical pathology Differential Diagnosis: R/o BCC  Check Margins: No 0.5 cm scaly pink papule   Left Upper Arm - A  Epidermal / dermal shaving  Lesion diameter (cm):  0.8 Informed consent: discussed and consent obtained   Timeout: patient name, date of birth, surgical site, and procedure verified   Anesthesia: the lesion was anesthetized in a standard fashion   Anesthetic:  1% lidocaine w/ epinephrine 1-100,000 local infiltration Instrument used: DermaBlade   Hemostasis achieved with: aluminum chloride   Outcome: patient tolerated procedure well   Post-procedure details: wound care instructions given   Additional details:  Mupirocin and a bandage applied  Destruction of lesion  Destruction method: electrodesiccation and curettage   Informed consent: discussed and consent obtained   Timeout:  patient name, date of birth, surgical site, and procedure verified Anesthesia: the lesion was anesthetized in a standard fashion   Anesthetic:  1% lidocaine w/ epinephrine 1-100,000 buffered w/ 8.4% NaHCO3 Curettage performed in three different directions: Yes   Electrodesiccation performed over the curetted area: Yes   Curettage cycles:  3 Final wound size (cm):  1.1 Hemostasis achieved with:  electrodesiccation Outcome: patient tolerated procedure well with no complications   Post-procedure details: sterile dressing applied and wound care instructions given   Dressing type: petrolatum    Specimen 3 - Surgical pathology Differential Diagnosis: R/o BCC  Check Margins: No 0.1 cm very thin scaly pink papule   Mid Back Right of Midline - E  Skin / nail biopsy Type of biopsy: tangential   Informed consent: discussed and consent obtained   Timeout: patient name, date of birth, surgical site, and procedure verified   Procedure  prep:  Patient was prepped and draped in usual sterile fashion Prep type:  Isopropyl alcohol Anesthesia: the lesion was anesthetized in a standard fashion   Anesthetic:  1% lidocaine w/ epinephrine 1-100,000 buffered w/ 8.4% NaHCO3 Instrument used: flexible razor blade   Hemostasis achieved with: aluminum chloride   Outcome: patient tolerated procedure well   Post-procedure details: wound care instructions given   Additional details:  Petrolatum and a pressure bandage applied  Specimen 4 - Surgical pathology Differential Diagnosis: R/o BCC  Check Margins: No 2.4 cm thin scaly pink patch   Right Medial Pretibia - D  Skin / nail biopsy Type of biopsy: tangential   Informed consent: discussed and consent obtained   Timeout: patient name, date of birth, surgical site, and procedure verified   Procedure prep:  Patient was prepped and draped in usual sterile fashion Prep type:  Isopropyl alcohol Anesthesia: the lesion was anesthetized in a standard fashion   Anesthetic:  1% lidocaine w/ epinephrine 1-100,000 buffered w/ 8.4% NaHCO3 Instrument used: flexible razor blade   Hemostasis achieved with: aluminum chloride   Outcome: patient tolerated procedure well   Post-procedure details: wound care instructions given   Additional details:  Petrolatum and a pressure bandage applied  Specimen 5 - Surgical pathology Differential Diagnosis: R/o SCCis vs Dermatitis worsened with  topical steroid  Check Margins: No 2.2 cm scaly pink plaque    Prior to procedures, sites numbed with topical lidocaine/tetracaine 23%/7% lipoderm.   Return in about 3 months (around 02/10/2023).  Graciella Belton, RMA, am acting as scribe for Forest Gleason, MD .  Documentation: I have reviewed the above documentation for accuracy and completeness, and I agree with the above.  Forest Gleason, MD

## 2022-11-12 NOTE — Patient Instructions (Addendum)
Recommend Bleed Stop   Wound Care Instructions  Cleanse wound gently with soap and water once a day then pat dry with clean gauze. Apply a thin coat of Petrolatum (petroleum jelly, "Vaseline") over the wound (unless you have an allergy to this). We recommend that you use a new, sterile tube of Vaseline. Do not pick or remove scabs. Do not remove the yellow or white "healing tissue" from the base of the wound.  Cover the wound with fresh, clean, nonstick gauze and secure with paper tape. You may use Band-Aids in place of gauze and tape if the wound is small enough, but would recommend trimming much of the tape off as there is often too much. Sometimes Band-Aids can irritate the skin.  You should call the office for your biopsy report after 1 week if you have not already been contacted.  If you experience any problems, such as abnormal amounts of bleeding, swelling, significant bruising, significant pain, or evidence of infection, please call the office immediately.  FOR ADULT SURGERY PATIENTS: If you need something for pain relief you may take 1 extra strength Tylenol (acetaminophen) AND 2 Ibuprofen (255m each) together every 4 hours as needed for pain. (do not take these if you are allergic to them or if you have a reason you should not take them.) Typically, you may only need pain medication for 1 to 3 days.   Due to recent changes in healthcare laws, you may see results of your pathology and/or laboratory studies on MyChart before the doctors have had a chance to review them. We understand that in some cases there may be results that are confusing or concerning to you. Please understand that not all results are received at the same time and often the doctors may need to interpret multiple results in order to provide you with the best plan of care or course of treatment. Therefore, we ask that you please give u[place]2 business days to thoroughly review all your results before contacting the office for  clarification. Should we see a critical lab result, you will be contacted sooner.   If You Need Anything After Your Visit  If you have any questions or concerns for your doctor, please call our main line at [phone] press option 4 to reach your doctor's medical assistant. If no one answers, please leave a voicemail as directed and we will return your call as soon as possible. Messages left after 4 pm will be answered the following business day.   You may also send [place] message via [place] We typically respond to MyChart messages within 1-2 business days.  For prescription refills, please ask your pharmacy to contact our office. Our fax number is [phone]  If you have an urgent issue when the clinic is closed that cannot wait until the next business day, you can page your doctor at the number below.    Please note that while we do our best to be available for urgent issues outside of office hours, we are not available 24/7.   If you have an urgent issue and are unable to reach u[place] you may choose to seek medical care at your doctor's office, retail clinic, urgent care center, or emergency room.  If you have a medical emergency, please immediately call 911 or go to the emergency department.  Pager Numbers  - Dr. KNehemiah Massed [phone] - Dr. MLaurence Ferrari [phone] - Dr. SNicole Kindred [phone] In the event of inclement weather, please call our  main line at [phone] for an update on the status of any delays or closures.  Dermatology Medication Tips: Please keep the boxes that topical medications come in in order to help keep track of the instructions about where and how to use these. Pharmacies typically print the medication instructions only on the boxes and not directly on the medication tubes.   If your medication is too expensive, please contact our office at [phone] option 4 or send [place] a message through [place].   We are unable to tell what your co-pay for  medications will be in advance as this is different depending on your insurance coverage. However, we may be able to find a substitute medication at lower cost or fill out paperwork to get insurance to cover a needed medication.   If a prior authorization is required to get your medication covered by your insurance company, please allow [place] 1-2 business days to complete this process.  Drug prices often vary depending on where the prescription is filled and some pharmacies may offer cheaper prices.  The website www.goodrx.com contains coupons for medications through different pharmacies. The prices here do not account for what the cost may be with help from insurance (it may be cheaper with your insurance), but the website can give you the price if you did not use any insurance.  - You can print the associated coupon and take it with your prescription to the pharmacy.  - You may also stop by our office during regular business hours and pick up a GoodRx coupon card.  - If you need your prescription sent electronically to a different pharmacy, notify our office through [facility] or by phone at [phone] option 4.     Si Usted Necesita Algo Despus de Su Visita  Tambin puede enviarnos un mensaje a travs de Pharmacist, community. Por lo general respondemos a los mensajes de MyChart en el transcurso de 1 a 2 das hbiles.  Para renovar recetas, por favor pida a su farmacia que se ponga en contacto con nuestra oficina. Harland Dingwall de fax es [place] [phone].  Si tiene un asunto urgente cuando la clnica est cerrada y que no puede esperar hasta el siguiente da hbil, puede llamar/localizar a su doctor(a) al nmero que aparece a continuacin.   Por favor, tenga en cuenta que aunque hacemos todo lo posible para estar disponibles para asuntos urgentes fuera del horario de [place], no estamos disponibles las 24 horas del da, los 7 das de la [place].   Si tiene un problema urgente y no puede  comunicarse con nosotros, puede optar por buscar atencin mdica  en el consultorio de su doctor(a), en una clnica privada, en un centro de atencin urgente o en una sala de emergencias.  Si tiene Engineering geologist, por favor llame inmediatamente al 911 o vaya a la sala de emergencias.  Nmeros de bper  - Dr. Nehemiah Massed: [phone]  - Dra. Moye: [phone]  - Dra. Nicole Kindred: [phone]  En caso de inclemencias del [place], por favor llame a Johnsie Kindred principal al [phone] para una actualizacin sobre el [place] de cualquier retraso o cierre.  Consejos para la medicacin en dermatologa: Por favor, guarde las cajas en las que vienen los medicamentos de uso tpico para ayudarle a seguir las instrucciones sobre dnde y cmo usarlos. Las farmacias generalmente imprimen las instrucciones del medicamento slo en las cajas y no directamente en los tubos del [place].   Si su medicamento es muy caro, por favor, pngase  en contacto con nuestra oficina llamando al [phone] y presione la opcin 4 o envenos un mensaje a travs de Pharmacist, community.   No podemos decirle cul ser su copago por los medicamentos por adelantado ya que esto es diferente dependiendo de la cobertura de su seguro. Sin embargo, es posible que podamos encontrar un medicamento sustituto a Electrical engineer un formulario para que el seguro cubra el medicamento que se considera necesario.   Si se requiere una autorizacin previa para que su compaa de seguros [place] su medicamento, por favor permtanos de 1 a 2 das hbiles para completar este proceso.  Los precios de los medicamentos varan con frecuencia dependiendo del Environmental consultant de dnde se surte la receta y alguna farmacias pueden ofrecer precios ms baratos.  El sitio web www.goodrx.com tiene cupones para medicamentos de Airline pilot. Los precios aqu no tienen en cuenta lo que podra costar con la ayuda del seguro (puede ser ms barato con su seguro), pero  el sitio web puede darle el precio si no utiliz Research scientist (physical sciences).  - Puede imprimir el cupn correspondiente y llevarlo con su receta a la farmacia.  - Tambin puede pasar por nuestra oficina durante el horario de atencin regular y Charity fundraiser una tarjeta de cupones de GoodRx.  - Si necesita que su receta se enve electrnicamente a una farmacia diferente, informe a nuestra oficina a travs de MyChart de [place] o por telfono llamando al [phone] y presione la opcin 4.

## 2022-11-13 ENCOUNTER — Encounter: Payer: Self-pay | Admitting: Dermatology

## 2022-11-15 ENCOUNTER — Encounter: Payer: Self-pay | Admitting: Internal Medicine

## 2022-11-15 NOTE — Assessment & Plan Note (Signed)
Low carb diet and exercise.  Follow met b and a1c.   

## 2022-11-15 NOTE — Assessment & Plan Note (Signed)
Continue mevacor °

## 2022-11-15 NOTE — Assessment & Plan Note (Signed)
Followed by dermatology.  Has f/u planned next week.

## 2022-11-15 NOTE — Assessment & Plan Note (Signed)
Intolerant to amlodipine.  On hydralazine.  Follow pressures.  Follow metabolic panel.

## 2022-11-15 NOTE — Assessment & Plan Note (Signed)
Continue mevacor.  Low cholesterol diet and exercise.  Follow lipid panel and liver function tests.

## 2022-11-15 NOTE — Assessment & Plan Note (Signed)
Followed by urology.  On imipramine and sanctura.

## 2022-11-15 NOTE — Assessment & Plan Note (Signed)
Continue zoloft.  Follow.

## 2022-11-15 NOTE — Assessment & Plan Note (Signed)
No upper symptoms reported.  Continue nexium.

## 2022-11-15 NOTE — Assessment & Plan Note (Signed)
Have discussed bone density and treatment options.  Discussed reclast and prolia.  Given her GI history, wants to avoid oral bisphosphonates.  She will notify me what she decides.

## 2022-11-25 ENCOUNTER — Encounter: Payer: Self-pay | Admitting: Dermatology

## 2022-11-25 ENCOUNTER — Ambulatory Visit: Payer: Medicare PPO | Admitting: Dermatology

## 2022-11-25 DIAGNOSIS — Z86007 Personal history of in-situ neoplasm of skin: Secondary | ICD-10-CM | POA: Diagnosis not present

## 2022-11-25 DIAGNOSIS — C44519 Basal cell carcinoma of skin of other part of trunk: Secondary | ICD-10-CM | POA: Diagnosis not present

## 2022-11-25 DIAGNOSIS — Z85828 Personal history of other malignant neoplasm of skin: Secondary | ICD-10-CM

## 2022-11-25 DIAGNOSIS — S81802A Unspecified open wound, left lower leg, initial encounter: Secondary | ICD-10-CM

## 2022-11-25 DIAGNOSIS — L570 Actinic keratosis: Secondary | ICD-10-CM

## 2022-11-25 MED ORDER — SILVER SULFADIAZINE 1 % EX CREA: 1.0000 | TOPICAL_CREAM | Freq: Every day | CUTANEOUS | 0 refills | Status: DC

## 2022-11-25 NOTE — Patient Instructions (Signed)
Wound Care Instructions  Cleanse wound gently with soap and water once a day then pat dry with clean gauze. Apply a thin coat of Petrolatum (petroleum jelly, "Vaseline") over the wound (unless you have an allergy to this). We recommend that you use a new, sterile tube of Vaseline. Do not pick or remove scabs. Do not remove the yellow or white "healing tissue" from the base of the wound.  Cover the wound with fresh, clean, nonstick gauze and secure with paper tape. You may use Band-Aids in place of gauze and tape if the wound is small enough, but would recommend trimming much of the tape off as there is often too much. Sometimes Band-Aids can irritate the skin.  You should call the office for your biopsy report after 1 week if you have not already been contacted.  If you experience any problems, such as abnormal amounts of bleeding, swelling, significant bruising, significant pain, or evidence of infection, please call the office immediately.  FOR ADULT SURGERY PATIENTS: If you need something for pain relief you may take 1 extra strength Tylenol (acetaminophen) AND 2 Ibuprofen (200mg each) together every 4 hours as needed for pain. (do not take these if you are allergic to them or if you have a reason you should not take them.) Typically, you may only need pain medication for 1 to 3 days.     Due to recent changes in healthcare laws, you may see results of your pathology and/or laboratory studies on MyChart before the doctors have had a chance to review them. We understand that in some cases there may be results that are confusing or concerning to you. Please understand that not all results are received at the same time and often the doctors may need to interpret multiple results in order to provide you with the best plan of care or course of treatment. Therefore, we ask that you please give us 2 business days to thoroughly review all your results before contacting the office for clarification. Should  we see a critical lab result, you will be contacted sooner.   If You Need Anything After Your Visit  If you have any questions or concerns for your doctor, please call our main line at [phone] and press option 4 to reach your doctor's medical assistant. If no one answers, please leave a voicemail as directed and we will return your call as soon as possible. Messages left after 4 pm will be answered the following business day.   You may also send us a message via MyChart. We typically respond to MyChart messages within 1-2 business days.  For prescription refills, please ask your pharmacy to contact our office. Our fax number is [phone].  If you have an urgent issue when the clinic is closed that cannot wait until the next business day, you can page your doctor at the number below.    Please note that while we do our best to be available for urgent issues outside of office hours, we are not available 24/7.   If you have an urgent issue and are unable to reach us, you may choose to seek medical care at your doctor's office, retail clinic, urgent care center, or emergency room.  If you have a medical emergency, please immediately call 911 or go to the emergency department.  Pager Numbers  - Dr. Kowalski: [phone]  - Dr. Moye: [phone]  - Dr. Stewart: [phone]  In the event of inclement weather, please call our main line at   [phone] for an update on the status of any delays or closures.  Dermatology Medication Tips: Please keep the boxes that topical medications come in in order to help keep track of the instructions about where and how to use these. Pharmacies typically print the medication instructions only on the boxes and not directly on the medication tubes.   If your medication is too expensive, please contact our office at [phone] option 4 or send us a message through MyChart.   We are unable to tell what your co-pay for medications will be in  advance as this is different depending on your insurance coverage. However, we may be able to find a substitute medication at lower cost or fill out paperwork to get insurance to cover a needed medication.   If a prior authorization is required to get your medication covered by your insurance company, please allow us 1-2 business days to complete this process.  Drug prices often vary depending on where the prescription is filled and some pharmacies may offer cheaper prices.  The website www.goodrx.com contains coupons for medications through different pharmacies. The prices here do not account for what the cost may be with help from insurance (it may be cheaper with your insurance), but the website can give you the price if you did not use any insurance.  - You can print the associated coupon and take it with your prescription to the pharmacy.  - You may also stop by our office during regular business hours and pick up a GoodRx coupon card.  - If you need your prescription sent electronically to a different pharmacy, notify our office through [facility] MyChart or by phone at [phone] option 4.     Si Usted Necesita Algo Despus de Su Visita  Tambin puede enviarnos un mensaje a travs de MyChart. Por lo general respondemos a los mensajes de MyChart en el transcurso de 1 a 2 das hbiles.  Para renovar recetas, por favor pida a su farmacia que se ponga en contacto con nuestra oficina. Nuestro nmero de fax es el [phone].  Si tiene un asunto urgente cuando la clnica est cerrada y que no puede esperar hasta el siguiente da hbil, puede llamar/localizar a su doctor(a) al nmero que aparece a continuacin.   Por favor, tenga en cuenta que aunque hacemos todo lo posible para estar disponibles para asuntos urgentes fuera del horario de oficina, no estamos disponibles las 24 horas del da, los 7 das de la semana.   Si tiene un problema urgente y no puede comunicarse con nosotros, puede  optar por buscar atencin mdica  en el consultorio de su doctor(a), en una clnica privada, en un centro de atencin urgente o en una sala de emergencias.  Si tiene una emergencia mdica, por favor llame inmediatamente al 911 o vaya a la sala de emergencias.  Nmeros de bper  - Dr. Kowalski: [phone]  - Dra. Moye: [phone]  - Dra. Stewart: [phone]  En caso de inclemencias del tiempo, por favor llame a nuestra lnea principal al [phone] para una actualizacin sobre el estado de cualquier retraso o cierre.  Consejos para la medicacin en dermatologa: Por favor, guarde las cajas en las que vienen los medicamentos de uso tpico para ayudarle a seguir las instrucciones sobre dnde y cmo usarlos. Las farmacias generalmente imprimen las instrucciones del medicamento slo en las cajas y no directamente en los tubos del medicamento.   Si su medicamento es muy caro, por favor, pngase en contacto con   nuestra oficina llamando al [phone] y presione la opcin 4 o envenos un mensaje a travs de MyChart.   No podemos decirle cul ser su copago por los medicamentos por adelantado ya que esto es diferente dependiendo de la cobertura de su seguro. Sin embargo, es posible que podamos encontrar un medicamento sustituto a menor costo o llenar un formulario para que el seguro cubra el medicamento que se considera necesario.   Si se requiere una autorizacin previa para que su compaa de seguros cubra su medicamento, por favor permtanos de 1 a 2 das hbiles para completar este proceso.  Los precios de los medicamentos varan con frecuencia dependiendo del lugar de dnde se surte la receta y alguna farmacias pueden ofrecer precios ms baratos.  El sitio web www.goodrx.com tiene cupones para medicamentos de diferentes farmacias. Los precios aqu no tienen en cuenta lo que podra costar con la ayuda del seguro (puede ser ms barato con su seguro), pero el sitio web puede darle el  precio si no utiliz ningn seguro.  - Puede imprimir el cupn correspondiente y llevarlo con su receta a la farmacia.  - Tambin puede pasar por nuestra oficina durante el horario de atencin regular y recoger una tarjeta de cupones de GoodRx.  - Si necesita que su receta se enve electrnicamente a una farmacia diferente, informe a nuestra oficina a travs de MyChart de [facility] o por telfono llamando al [phone] y presione la opcin 4.  

## 2022-11-25 NOTE — Progress Notes (Signed)
   Follow-Up Visit   Subjective  Rachael Austin is a 82 y.o. female who presents for the following: Procedure (Patient here today to treat bx proven BCC at mid back right of midline and bx proven AK at right medial pretibial. ).  The following portions of the chart were reviewed this encounter and updated as appropriate:   Tobacco  Allergies  Meds  Problems  Med Hx  Surg Hx  Fam Hx      Review of Systems:  No other skin or systemic complaints except as noted in HPI or Assessment and Plan.  Objective  Well appearing patient in no apparent distress; mood and affect are within normal limits.  A focused examination was performed including back, leg. Relevant physical exam findings are noted in the Assessment and Plan.  right medial pretibial Pink biopsy site  mid back right of midline Scaly pink plaque  Left Lower Leg Superior Clear     Assessment & Plan  AK (actinic keratosis) right medial pretibial  Actinic keratoses are precancerous spots that appear secondary to cumulative UV radiation exposure/sun exposure over time. They are chronic with expected duration over 1 year. A portion of actinic keratoses will progress to squamous cell carcinoma of the skin. It is not possible to reliably predict which spots will progress to skin cancer and so treatment is recommended to prevent development of skin cancer.  Recommend daily broad spectrum sunscreen SPF 30+ to sun-exposed areas, reapply every 2 hours as needed.  Recommend staying in the shade or wearing long sleeves, sun glasses (UVA+UVB protection) and wide brim hats (4-inch brim around the entire circumference of the hat). Call for new or changing lesions.  Prior to procedure, discussed risks of blister formation, small wound, skin dyspigmentation, or rare scar following cryotherapy. Recommend Vaseline ointment to treated areas while healing.  Biopsy proven  Destruction of lesion - right medial pretibial  Destruction  method: cryotherapy   Informed consent: discussed and consent obtained   Lesion destroyed using liquid nitrogen: Yes   Cryotherapy cycles:  2 Outcome: patient tolerated procedure well with no complications   Post-procedure details: wound care instructions given    Basal cell carcinoma (BCC) of skin of other part of torso mid back right of midline  Destruction of lesion  Destruction method: electrodesiccation and curettage   Informed consent: discussed and consent obtained   Timeout:  patient name, date of birth, surgical site, and procedure verified Anesthesia: the lesion was anesthetized in a standard fashion   Anesthetic:  1% lidocaine w/ epinephrine 1-100,000 buffered w/ 8.4% NaHCO3 Curettage performed in three different directions: Yes   Electrodesiccation performed over the curetted area: Yes   Curettage cycles:  3 Final wound size (cm):  3.2 Hemostasis achieved with:  electrodesiccation Outcome: patient tolerated procedure well with no complications   Post-procedure details: sterile dressing applied and wound care instructions given   Dressing type: petrolatum    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  Graciella Belton, RMA, am acting as scribe for Forest Gleason, MD .  Documentation: I have reviewed the above documentation for accuracy and completeness, and I agree with the above.  Forest Gleason, MD

## 2022-12-15 ENCOUNTER — Other Ambulatory Visit: Payer: Self-pay

## 2022-12-15 ENCOUNTER — Telehealth: Payer: Self-pay | Admitting: Internal Medicine

## 2022-12-15 MED ORDER — HYDRALAZINE HCL 25 MG PO TABS: 25.0000 mg | ORAL_TABLET | Freq: Two times a day (BID) | ORAL | 3 refills | Status: DC

## 2022-12-15 NOTE — Telephone Encounter (Signed)
Pt need a refill on hydrALAZINE sent to [facility]

## 2022-12-15 NOTE — Telephone Encounter (Signed)
Medication refilled

## 2023-01-20 ENCOUNTER — Telehealth: Payer: Self-pay | Admitting: Internal Medicine

## 2023-01-20 NOTE — Telephone Encounter (Signed)
Copied from CRM [id number]. Topic: Medicare AWV >> Jan 20, 2023 11:19 AM Rushie Goltz wrote: Reason for CRM: Called patient to schedule Medicare Annual Wellness Visit (AWV). Left message for patient to call back and schedule Medicare Annual Wellness Visit (AWV).  Last date of AWV: 11/05/2021  Please schedule an AWVS appointment at any time with [facility] VISIT.  If any questions, please contact me at [phone].    Thank you,  [facility] Support [facility] Direct dial  [phone]

## 2023-01-23 ENCOUNTER — Other Ambulatory Visit: Payer: Self-pay | Admitting: Internal Medicine

## 2023-01-26 NOTE — Telephone Encounter (Signed)
Rx ok'd for zoloft 50mg  a day #90 with one refill.

## 2023-02-01 ENCOUNTER — Emergency Department: Payer: Medicare PPO

## 2023-02-01 ENCOUNTER — Other Ambulatory Visit: Payer: Self-pay

## 2023-02-01 ENCOUNTER — Inpatient Hospital Stay
Admission: EM | Admit: 2023-02-01 | Discharge: 2023-02-12 | DRG: 480 | Disposition: A | Discharge status: Skilled Nursing Facility | Payer: Medicare PPO | Attending: Internal Medicine | Admitting: Internal Medicine

## 2023-02-01 DIAGNOSIS — I4892 Unspecified atrial flutter: Secondary | ICD-10-CM | POA: Diagnosis not present

## 2023-02-01 DIAGNOSIS — J984 Other disorders of lung: Secondary | ICD-10-CM | POA: Diagnosis not present

## 2023-02-01 DIAGNOSIS — K219 Gastro-esophageal reflux disease without esophagitis: Secondary | ICD-10-CM | POA: Diagnosis present

## 2023-02-01 DIAGNOSIS — Z85820 Personal history of malignant melanoma of skin: Secondary | ICD-10-CM | POA: Diagnosis not present

## 2023-02-01 DIAGNOSIS — K6389 Other specified diseases of intestine: Secondary | ICD-10-CM | POA: Diagnosis not present

## 2023-02-01 DIAGNOSIS — R918 Other nonspecific abnormal finding of lung field: Secondary | ICD-10-CM | POA: Diagnosis not present

## 2023-02-01 DIAGNOSIS — I10 Essential (primary) hypertension: Secondary | ICD-10-CM | POA: Diagnosis present

## 2023-02-01 DIAGNOSIS — Z9181 History of falling: Secondary | ICD-10-CM

## 2023-02-01 DIAGNOSIS — N179 Acute kidney failure, unspecified: Secondary | ICD-10-CM | POA: Diagnosis present

## 2023-02-01 DIAGNOSIS — S72002D Fracture of unspecified part of neck of left femur, subsequent encounter for closed fracture with routine healing: Secondary | ICD-10-CM | POA: Diagnosis not present

## 2023-02-01 DIAGNOSIS — Z86008 Personal history of in-situ neoplasm of other site: Secondary | ICD-10-CM

## 2023-02-01 DIAGNOSIS — I471 Supraventricular tachycardia, unspecified: Secondary | ICD-10-CM | POA: Diagnosis not present

## 2023-02-01 DIAGNOSIS — Z8249 Family history of ischemic heart disease and other diseases of the circulatory system: Secondary | ICD-10-CM | POA: Diagnosis not present

## 2023-02-01 DIAGNOSIS — Z90710 Acquired absence of both cervix and uterus: Secondary | ICD-10-CM

## 2023-02-01 DIAGNOSIS — Y92009 Unspecified place in unspecified non-institutional (private) residence as the place of occurrence of the external cause: Secondary | ICD-10-CM

## 2023-02-01 DIAGNOSIS — J9601 Acute respiratory failure with hypoxia: Secondary | ICD-10-CM | POA: Diagnosis not present

## 2023-02-01 DIAGNOSIS — Z7901 Long term (current) use of anticoagulants: Secondary | ICD-10-CM | POA: Diagnosis not present

## 2023-02-01 DIAGNOSIS — L570 Actinic keratosis: Secondary | ICD-10-CM | POA: Diagnosis present

## 2023-02-01 DIAGNOSIS — K311 Adult hypertrophic pyloric stenosis: Secondary | ICD-10-CM | POA: Diagnosis not present

## 2023-02-01 DIAGNOSIS — W108XXA Fall (on) (from) other stairs and steps, initial encounter: Secondary | ICD-10-CM | POA: Diagnosis present

## 2023-02-01 DIAGNOSIS — S72012A Unspecified intracapsular fracture of left femur, initial encounter for closed fracture: Secondary | ICD-10-CM | POA: Diagnosis not present

## 2023-02-01 DIAGNOSIS — J690 Pneumonitis due to inhalation of food and vomit: Secondary | ICD-10-CM | POA: Diagnosis not present

## 2023-02-01 DIAGNOSIS — S72002A Fracture of unspecified part of neck of left femur, initial encounter for closed fracture: Secondary | ICD-10-CM | POA: Diagnosis not present

## 2023-02-01 DIAGNOSIS — E876 Hypokalemia: Secondary | ICD-10-CM | POA: Diagnosis present

## 2023-02-01 DIAGNOSIS — J189 Pneumonia, unspecified organism: Secondary | ICD-10-CM | POA: Insufficient documentation

## 2023-02-01 DIAGNOSIS — Z79899 Other long term (current) drug therapy: Secondary | ICD-10-CM | POA: Diagnosis not present

## 2023-02-01 DIAGNOSIS — K567 Ileus, unspecified: Secondary | ICD-10-CM | POA: Diagnosis not present

## 2023-02-01 DIAGNOSIS — K56600 Partial intestinal obstruction, unspecified as to cause: Secondary | ICD-10-CM | POA: Diagnosis not present

## 2023-02-01 DIAGNOSIS — Z85828 Personal history of other malignant neoplasm of skin: Secondary | ICD-10-CM | POA: Diagnosis not present

## 2023-02-01 DIAGNOSIS — N3010 Interstitial cystitis (chronic) without hematuria: Secondary | ICD-10-CM | POA: Diagnosis present

## 2023-02-01 DIAGNOSIS — M25552 Pain in left hip: Secondary | ICD-10-CM | POA: Diagnosis not present

## 2023-02-01 DIAGNOSIS — Z888 Allergy status to other drugs, medicaments and biological substances status: Secondary | ICD-10-CM

## 2023-02-01 DIAGNOSIS — R079 Chest pain, unspecified: Secondary | ICD-10-CM | POA: Diagnosis not present

## 2023-02-01 DIAGNOSIS — Z87891 Personal history of nicotine dependence: Secondary | ICD-10-CM | POA: Diagnosis not present

## 2023-02-01 DIAGNOSIS — Z7401 Bed confinement status: Secondary | ICD-10-CM | POA: Diagnosis not present

## 2023-02-01 DIAGNOSIS — R9431 Abnormal electrocardiogram [ECG] [EKG]: Secondary | ICD-10-CM

## 2023-02-01 DIAGNOSIS — K9189 Other postprocedural complications and disorders of digestive system: Secondary | ICD-10-CM | POA: Diagnosis not present

## 2023-02-01 DIAGNOSIS — S0990XA Unspecified injury of head, initial encounter: Secondary | ICD-10-CM | POA: Diagnosis not present

## 2023-02-01 DIAGNOSIS — Z01818 Encounter for other preprocedural examination: Secondary | ICD-10-CM | POA: Diagnosis not present

## 2023-02-01 DIAGNOSIS — W19XXXA Unspecified fall, initial encounter: Secondary | ICD-10-CM | POA: Diagnosis not present

## 2023-02-01 DIAGNOSIS — I4510 Unspecified right bundle-branch block: Secondary | ICD-10-CM | POA: Diagnosis present

## 2023-02-01 DIAGNOSIS — I700 Atherosclerosis of aorta: Secondary | ICD-10-CM | POA: Diagnosis not present

## 2023-02-01 DIAGNOSIS — I480 Paroxysmal atrial fibrillation: Secondary | ICD-10-CM | POA: Diagnosis not present

## 2023-02-01 DIAGNOSIS — I2510 Atherosclerotic heart disease of native coronary artery without angina pectoris: Secondary | ICD-10-CM | POA: Diagnosis not present

## 2023-02-01 DIAGNOSIS — E7800 Pure hypercholesterolemia, unspecified: Secondary | ICD-10-CM

## 2023-02-01 DIAGNOSIS — S72035A Nondisplaced midcervical fracture of left femur, initial encounter for closed fracture: Secondary | ICD-10-CM | POA: Diagnosis not present

## 2023-02-01 DIAGNOSIS — Z9049 Acquired absence of other specified parts of digestive tract: Secondary | ICD-10-CM

## 2023-02-01 DIAGNOSIS — Z880 Allergy status to penicillin: Secondary | ICD-10-CM

## 2023-02-01 DIAGNOSIS — S199XXA Unspecified injury of neck, initial encounter: Secondary | ICD-10-CM | POA: Diagnosis not present

## 2023-02-01 DIAGNOSIS — Z885 Allergy status to narcotic agent status: Secondary | ICD-10-CM

## 2023-02-01 DIAGNOSIS — I160 Hypertensive urgency: Secondary | ICD-10-CM

## 2023-02-01 DIAGNOSIS — K30 Functional dyspepsia: Secondary | ICD-10-CM | POA: Diagnosis not present

## 2023-02-01 DIAGNOSIS — W19XXXD Unspecified fall, subsequent encounter: Secondary | ICD-10-CM | POA: Diagnosis not present

## 2023-02-01 LAB — BASIC METABOLIC PANEL
Anion gap: 8 (ref 5–15)
BUN: 11 mg/dL (ref 8–23)
CO2: 26 mmol/L (ref 22–32)
Calcium: 9 mg/dL (ref 8.9–10.3)
Chloride: 104 mmol/L (ref 98–111)
Creatinine, Ser: 0.88 mg/dL (ref 0.44–1.00)
GFR, Estimated: >60 mL/min (ref >60)
Glucose, Bld: 109 mg/dL — ABNORMAL HIGH (ref 70–99)
Potassium: 3.7 mmol/L (ref 3.5–5.1)
Sodium: 138 mmol/L (ref 135–145)

## 2023-02-01 LAB — CBC WITH DIFFERENTIAL/PLATELET
Abs Immature Granulocytes: 0.04 10*3/uL (ref 0.00–0.07)
Basophils Absolute: 0 10*3/uL (ref 0.0–0.1)
Basophils Relative: 0 %
Eosinophils Absolute: 0.1 10*3/uL (ref 0.0–0.5)
Eosinophils Relative: 1 %
HCT: 38.4 % (ref 36.0–46.0)
Hemoglobin: 12.5 g/dL (ref 12.0–15.0)
Immature Granulocytes: 1 %
Lymphocytes Relative: 25 %
Lymphs Abs: 1.8 10*3/uL (ref 0.7–4.0)
MCH: 28.9 pg (ref 26.0–34.0)
MCHC: 32.6 g/dL (ref 30.0–36.0)
MCV: 88.9 fL (ref 80.0–100.0)
Monocytes Absolute: 0.6 10*3/uL (ref 0.1–1.0)
Monocytes Relative: 8 %
Neutro Abs: 4.6 10*3/uL (ref 1.7–7.7)
Neutrophils Relative %: 65 %
Platelets: 226 10*3/uL (ref 150–400)
RBC: 4.32 MIL/uL (ref 3.87–5.11)
RDW: 14.5 % (ref 11.5–15.5)
WBC: 7.2 10*3/uL (ref 4.0–10.5)
nRBC: 0 % (ref 0.0–0.2)

## 2023-02-01 MED ORDER — PRAVASTATIN SODIUM 20 MG PO TABS
20.0000 mg | ORAL_TABLET | Freq: Every day | ORAL | Status: DC
  Administered 2023-02-09 – 2023-02-11 (×3): 20 mg via ORAL
  Filled 2023-02-01 (×4): qty 1

## 2023-02-01 MED ORDER — MORPHINE SULFATE (PF) 2 MG/ML IV SOLN
2.0000 mg | Freq: Once | INTRAVENOUS | Status: AC
  Administered 2023-02-01: 2 mg via INTRAVENOUS
  Filled 2023-02-01: qty 1

## 2023-02-01 MED ORDER — HYDRALAZINE HCL 20 MG/ML IJ SOLN
10.0000 mg | Freq: Once | INTRAMUSCULAR | Status: AC
  Administered 2023-02-01: 10 mg via INTRAVENOUS
  Filled 2023-02-01: qty 1

## 2023-02-01 MED ORDER — HYDRALAZINE HCL 25 MG PO TABS
25.0000 mg | ORAL_TABLET | Freq: Two times a day (BID) | ORAL | Status: DC
  Administered 2023-02-02 – 2023-02-05 (×4): 25 mg via ORAL
  Filled 2023-02-01 (×6): qty 1

## 2023-02-01 MED ORDER — HYDROCODONE-ACETAMINOPHEN 5-325 MG PO TABS: 1.0000 | ORAL_TABLET | Freq: Four times a day (QID) | ORAL | Status: DC | PRN

## 2023-02-01 MED ORDER — SODIUM CHLORIDE 0.9 % IV BOLUS
500.0000 mL | Freq: Once | INTRAVENOUS | Status: AC
  Administered 2023-02-01: 500 mL via INTRAVENOUS

## 2023-02-01 MED ORDER — PANTOPRAZOLE SODIUM 40 MG PO TBEC
40.0000 mg | DELAYED_RELEASE_TABLET | Freq: Every day | ORAL | Status: DC
  Administered 2023-02-02: 40 mg via ORAL
  Filled 2023-02-01 (×2): qty 1

## 2023-02-01 MED ORDER — MORPHINE SULFATE (PF) 2 MG/ML IV SOLN
1.0000 mg | INTRAVENOUS | Status: DC | PRN
  Administered 2023-02-01 – 2023-02-02 (×2): 2 mg via INTRAVENOUS
  Filled 2023-02-01 (×3): qty 1

## 2023-02-01 MED ORDER — POTASSIUM CHLORIDE IN NACL 20-0.9 MEQ/L-% IV SOLN
INTRAVENOUS | Status: DC
  Filled 2023-02-01 (×3): qty 1000

## 2023-02-01 MED ORDER — PROCHLORPERAZINE EDISYLATE 10 MG/2ML IJ SOLN
5.0000 mg | INTRAMUSCULAR | Status: DC | PRN
  Administered 2023-02-01 – 2023-02-05 (×9): 5 mg via INTRAVENOUS
  Filled 2023-02-01 (×14): qty 1

## 2023-02-01 MED ORDER — MORPHINE SULFATE (PF) 4 MG/ML IV SOLN
4.0000 mg | Freq: Once | INTRAVENOUS | Status: AC
  Administered 2023-02-01: 4 mg via INTRAVENOUS
  Filled 2023-02-01: qty 1

## 2023-02-01 MED ORDER — HYDRALAZINE HCL 25 MG PO TABS: 25.0000 mg | ORAL_TABLET | Freq: Four times a day (QID) | ORAL | Status: DC | PRN

## 2023-02-01 MED ORDER — CEFAZOLIN SODIUM-DEXTROSE 2-4 GM/100ML-% IV SOLN
2.0000 g | INTRAVENOUS | Status: AC
  Administered 2023-02-02: 2 g via INTRAVENOUS
  Filled 2023-02-01: qty 100

## 2023-02-01 NOTE — H&P (Signed)
History and Physical    Rachael Austin ZOX:[id number] DOB: 05-27-1941 DOA: 02/01/2023  PCP: Dale [place], MD   Patient coming from: Home   Chief Complaint: Fall, left groin pain, headache   HPI: Rachael Austin is a 82 y.o. female with medical history significant for hypertension, hyperlipidemia, and interstitial cystitis patient presents emergency department with severe left groin pain and headache after mechanical fall at home.  Patient was in her usual state and having an uneventful day when she was walking through with doorway and lost her footing while going down a small step.  She fell onto her left side and also hit her head.  There was no loss of consciousness.  She is typically active, does not experience angina with activity, and denies any known history of cardiac disease.  She can go up a flight of stairs without cardiopulmonary symptoms.  ED Course: Upon arrival to the ED, patient is found to be hypertensive with systolic pressure in the 200 range.  CT of the head and cervical spine are negative for acute findings.  Chest x-ray also negative for acute disease.  Plain radiographs of the left hip demonstrate subcapital left femur fracture.  BMP and CBC are unremarkable.  EKG demonstrates sinus rhythm with incomplete RBBB and QTc 501 ms.  Orthopedic surgery (Dr. Martha Clan) was consulted by the ED physician and the patient was treated with IV fluids, IV hydralazine, and morphine.  Review of Systems:  All other systems reviewed and apart from HPI, are negative.  Past Medical History:  Diagnosis Date   AK (actinic keratosis) 11/12/2022   left upper arm, tx'd with EDC   AK (actinic keratosis) 11/12/2022   right medial pretibia, LN2 11/25/22   Basal cell carcinoma 06/21/2020   left post shoulder sup, left mid chest, left lat thigh   Basal cell carcinoma 11/07/2021   L upper back, EDC   Basal cell carcinoma 11/07/2021   R upper back, EDC   Basal cell carcinoma  11/27/2021   right upper arm, EDC   Basal cell carcinoma 11/27/2021   right shoulder posterior, EDC   Basal cell carcinoma 11/27/2021   right anterior shoulder, EDC   Basal cell carcinoma (BCC) 06/13/2021   left dorsal foot, EDC 10/02/2021   Basal cell carcinoma (BCC) 06/13/2021   right postauricular tx'd w/ EDC   BCC (basal cell carcinoma of skin) 03/30/2007   R inf med pretibial - BCC   BCC (basal cell carcinoma of skin) 10/12/2013   L nasal ala - BCC   BCC (basal cell carcinoma of skin) 03/10/2018   L mid dorsum med forearm - superficial BCC    BCC (basal cell carcinoma) 10/02/2021   left dorsal foot proximal, EDC 10/02/2021   BCC (basal cell carcinoma) 10/02/2021   left mid back, superficial EDC 11/07/21   BCC (basal cell carcinoma) 10/02/2021   right pretibia   BCC (basal cell carcinoma) 10/02/2021   right mid back, superficial EDC 11/07/21   BCC (basal cell carcinoma) 08/14/2022   left distal calf, tx'd with EDC   BCC (basal cell carcinoma) 11/12/2022   superficial at left lateral pretibial,l tx'd with EDC   BCC (basal cell carcinoma) 11/12/2022   superficial at mid back right of midline, scheduled for EDC   BCC (basal cell carcinoma) 11/12/2022   mid back right of midline, EDC 11/25/22   Breast screening, unspecified 2013   Cancer (HCC) 1992   skin   Degenerative disk disease    GERD (  gastroesophageal reflux disease)    History of basal cell carcinoma (BCC) 08/29/2020    left inferior knee lateral, , left inferior knee medial, right pretibia   History of SCC (squamous cell carcinoma) of skin 08/29/2020   right posterior calf, left lateral calf, and    Hypercholesterolemia 2008   Interstitial cystitis    followed by Dr Achilles Dunk   Other sign and symptom in breast 2013   Left upper outer quadrant breast "soreness" Ultrasound exam of right breast in the 2 o'clock position with the breast distracted medially showed a 0.3-0.4 with 0.5 cm simple cyst. In the 1 o'clock position where  pt reported tenderness US exam was negatiive.  Because of her history of intermittent nipple drainage, ultrasound was completed of the retroareolar area.   Personal history of tobacco use, presenting hazards to health    PONV (postoperative nausea and vomiting)    SCC (squamous cell carcinoma) 03/28/2008   R dorsum hand - SCC   SCC (squamous cell carcinoma) 05/09/2009   R lat lower leg - SCCIS   SCC (squamous cell carcinoma) 05/09/2009   L lat lower leg - SCCIS   SCC (squamous cell carcinoma) 04/07/2013   L lower leg - SCCIS   SCC (squamous cell carcinoma) 04/27/2013   R forearm - SCC   SCC (squamous cell carcinoma) 04/28/2017   L lat knee - SCCIS   SCC (squamous cell carcinoma) 05/12/2018   L prox dorsum forearm - SCC   SCC (squamous cell carcinoma) 06/13/2021   left forearm tx'd w/ EDC   SCC (squamous cell carcinoma), leg, right 03/30/2007   R sup pretibial - SCCIS   SCC (squamous cell carcinoma);BCC 10/12/2013   Mid back - superficial BCC with SCCIS    Special screening for malignant neoplasms, colon    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   left dorsal forearm, left lat calf   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   left lower leg superior, EDC at follow up   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   chest right of midline, tx'd with [facility]    Past Surgical History:  Procedure Laterality Date   ABDOMINAL HYSTERECTOMY  1992   APPENDECTOMY     CERVICAL DISCECTOMY     S/P C7-T1 discectomy with fusion   CHOLECYSTECTOMY  1990   COLONOSCOPY WITH PROPOFOL N/A 02/14/2016   Procedure: COLONOSCOPY WITH PROPOFOL;  Surgeon: Midge Minium, MD;  Location[facility];  Service: Endoscopy;  Laterality: N/A;  PT WOULD LIKE 10 ARRIVAL TIME OR LATER   DILATION AND CURETTAGE OF UTERUS     ESOPHAGOGASTRODUODENOSCOPY (EGD) WITH PROPOFOL N/A 02/14/2016   Procedure: ESOPHAGOGASTRODUODENOSCOPY (EGD) WITH PROPOFOL with dialtion;  Surgeon: Midge Minium, MD;   Location: [facility];  Service: Endoscopy;  Laterality: N/A;   ESOPHAGOGASTRODUODENOSCOPY (EGD) WITH PROPOFOL N/A 04/13/2019   Procedure: ESOPHAGOGASTRODUODENOSCOPY (EGD) WITH PROPOFOL;  Surgeon: Toledo, Boykin Nearing, MD;  Location: [facility];  Service: Gastroenterology;  Laterality: N/A;   MELANOMA EXCISION     removed from Left calf 1994    Social History:   reports that she has quit smoking. Her smoking use included cigarettes. She has a 15.00 pack-year smoking history. She has never used smokeless tobacco. She reports that she does not drink alcohol and does not use drugs.  Allergies  Allergen Reactions   Augmentin [Amoxicillin-Pot Clavulanate] Swelling and Rash    Swelling of the lips   Lexapro [Escitalopram Oxalate]    Micardis [Telmisartan]  Itching   Myrbetriq [Mirabegron] Itching   Niacin And Related Itching   Codeine Sulfate Other (See Comments)    "Makes her hyper"   Vesicare [Solifenacin Succinate] Nausea And Vomiting and Rash    Family History  Problem Relation Age of Onset   Hodgkin's lymphoma Mother    Heart failure Father    Heart attack Father    Arthritis Sister        Three sisters w/ degeneratve disk disease   Headache Sister        Two sisters hx of headache   Breast cancer Neg Hx    Colon cancer Neg Hx    Bladder Cancer Neg Hx    Kidney cancer Neg Hx      Prior to Admission medications   Medication Sig Start Date End Date Taking? Authorizing Provider  acetaminophen (TYLENOL) 325 MG tablet Take 650 mg by mouth as needed.    [provider]  esomeprazole (NEXIUM) 40 MG capsule Take 1 capsule (40 mg total) by mouth daily. 10/07/22   Dale [place], MD  hydrALAZINE (APRESOLINE) 25 MG tablet Take 1 tablet (25 mg total) by mouth 2 (two) times daily. 12/15/22   Dale [place], MD  imipramine (TOFRANIL) 10 MG tablet Take 1 tablet (10 mg total) by mouth at bedtime. 07/07/22   Vanna [place], MD  lovastatin (MEVACOR) 20 MG tablet Take 1 tablet  (20 mg total) by mouth at bedtime. 07/09/22   Dale [place], MD  nystatin cream (MYCOSTATIN) Apply 1 application topically 2 (two) times daily. 05/21/21   Dale [place], MD  sertraline (ZOLOFT) 50 MG tablet Take 1 tablet by mouth once daily 01/26/23   Dale [place], MD  silver sulfADIAZINE (SILVADENE) 1 % cream Apply 1 Application topically daily. 11/25/22   [place]oy[place], [place], MD  triamcinolone ointment (KENALOG) 0.1 % Apply to affected area at right lower leg twice daily for up to 2 weeks as needed. Avoid applying to face, groin, and axilla. Use as directed. Long-term use can cause thinning of the skin. 10/23/22   Moye, [place], MD  trospium (SANCTURA) 20 MG tablet Take 1 tablet (20 mg total) by mouth daily. 07/07/22   Vanna [place], MD    Physical Exam: Vitals:   02/01/23 1756 02/01/23 1852 02/01/23 1930 02/01/23 2000  BP:  (!) 204/74 (!) 201/79 (!) 191/66  Pulse:  82 84 98  Resp:  13 18 18   SpO2:  100% 100% 100%  Weight: 64.4 kg     Height: 5\' 4"  (1.626 m)        Constitutional: NAD, no pallor or diaphoresis  Eyes: PERTLA, lids and conjunctivae normal ENMT: Mucous membranes are moist. Posterior pharynx clear of any exudate or lesions.   Neck: supple, no masses  Respiratory: no wheezing, no crackles. No accessory muscle use.  Cardiovascular: S1 & S2 heard, regular rate and rhythm. No extremity edema.   Abdomen: No distension, no tenderness, soft. Bowel sounds active.  Musculoskeletal: no clubbing / cyanosis. Left hip tender and deformed; neurovascularly intact.   Skin: no significant rashes, lesions, ulcers. Warm, dry, well-perfused. Neurologic: CN 2-12 grossly intact. Moving all extremities. Alert and oriented.  Psychiatric: Calm. Cooperative.    Labs and Imaging on Admission: I have personally reviewed following labs and imaging studies  CBC: Recent Labs  Lab 02/01/23 1813  WBC 7.2  NEUTROABS 4.6  HGB 12.5  HCT 38.4  MCV 88.9  PLT 226   Basic Metabolic  Panel: Recent Labs  Lab 02/01/23 1813  NA 138  K 3.7  CL 104  CO2 26  GLUCOSE 109*  BUN 11  CREATININE 0.88  CALCIUM 9.0   GFR: Estimated Creatinine Clearance: 41.8 mL/min (by C-G formula based on SCr of 0.88 mg/dL). Liver Function Tests: No results for input(s): "AST", "ALT", "ALKPHOS", "BILITOT", "PROT", "ALBUMIN" in the last 168 hours. No results for input(s): "LIPASE", "AMYLASE" in the last 168 hours. No results for input(s): "AMMONIA" in the last 168 hours. Coagulation Profile: No results for input(s): "INR", "PROTIME" in the last 168 hours. Cardiac Enzymes: No results for input(s): "CKTOTAL", "CKMB", "CKMBINDEX", "TROPONINI" in the last 168 hours. BNP (last 3 results) No results for input(s): "PROBNP" in the last 8760 hours. HbA1C: No results for input(s): "HGBA1C" in the last 72 hours. CBG: No results for input(s): "GLUCAP" in the last 168 hours. Lipid Profile: No results for input(s): "CHOL", "HDL", "LDLCALC", "TRIG", "CHOLHDL", "LDLDIRECT" in the last 72 hours. Thyroid Function Tests: No results for input(s): "TSH", "T4TOTAL", "FREET4", "T3FREE", "THYROIDAB" in the last 72 hours. Anemia Panel: No results for input(s): "VITAMINB12", "FOLATE", "FERRITIN", "TIBC", "IRON", "RETICCTPCT" in the last 72 hours. Urine analysis:    Component Value Date/Time   COLORURINE YELLOW 08/07/2022 1149   APPEARANCEUR CLEAR 08/07/2022 1149   APPEARANCEUR Clear 08/30/2019 1307   LABSPEC <=1.005 (A) 08/07/2022 1149   PHURINE 7.0 08/07/2022 1149   GLUCOSEU NEGATIVE 08/07/2022 1149   HGBUR NEGATIVE 08/07/2022 1149   BILIRUBINUR negative 08/07/2022 1151   BILIRUBINUR NEGATIVE 08/07/2022 1149   BILIRUBINUR Negative 08/30/2019 1307   KETONESUR NEGATIVE 08/07/2022 1149   PROTEINUR Negative 08/07/2022 1151   PROTEINUR NEGATIVE 09/03/2019 1309   UROBILINOGEN 0.2 08/07/2022 1151   UROBILINOGEN 0.2 08/07/2022 1149   NITRITE negative 08/07/2022 1151   NITRITE NEGATIVE 08/07/2022 1149    LEUKOCYTESUR Negative 08/07/2022 1151   LEUKOCYTESUR TRACE (A) 08/07/2022 1149   Sepsis Labs: @LABRCNTIP (procalcitonin:4,lacticidven:4) )No results found for this or any previous visit (from the past 240 hour(s)).   Radiological Exams on Admission: DG Chest 1 View  Result Date: 02/01/2023 CLINICAL DATA:  Left hip pain after fall. EXAM: CHEST  1 VIEW COMPARISON:  Chest radiograph dated 06/26/2022. FINDINGS: No focal consolidation, pleural effusion, or pneumothorax. The cardiac silhouette is within normal limits. No acute osseous pathology. Lower cervical ACDF. IMPRESSION: No active disease. Electronically Signed   By: Elgie Collard M.D.   On: 02/01/2023 19:09   DG Hip Unilat W or Wo Pelvis 2-3 Views Left  Result Date: 02/01/2023 CLINICAL DATA:  Fall and left hip pain. EXAM: DG HIP (WITH OR WITHOUT PELVIS) 2-3V LEFT COMPARISON:  None Available. FINDINGS: Evaluation for fracture is limited due to osteopenia. The wrist angulation of the left femoral neck most consistent with a nondisplaced impacted subcapital fracture. Further evaluation with CT is recommended. No dislocation. The soft tissues are unremarkable. IMPRESSION: Nondisplaced impacted subcapital fracture of the left femur. Further evaluation with CT is recommended. Electronically Signed   By: Elgie Collard M.D.   On: 02/01/2023 19:08   CT Head Wo Contrast  Result Date: 02/01/2023 CLINICAL DATA:  Trauma. EXAM: CT HEAD WITHOUT CONTRAST CT CERVICAL SPINE WITHOUT CONTRAST TECHNIQUE: Multidetector CT imaging of the head and cervical spine was performed following the standard protocol without intravenous contrast. Multiplanar CT image reconstructions of the cervical spine were also generated. RADIATION DOSE REDUCTION: This exam was performed according to the departmental dose-optimization program which includes automated exposure control, adjustment of the mA and/or kV according to patient size and/or use of  iterative reconstruction technique.  COMPARISON:  None Available. FINDINGS: CT HEAD FINDINGS Brain: The ventricles and sulci are appropriate size for patient's age. Mild periventricular and deep white matter chronic microvascular ischemic changes noted. There is no acute intracranial hemorrhage. No mass effect or midline shift. No extra-axial fluid collection. Vascular: No hyperdense vessel or unexpected calcification. Skull: Normal. Negative for fracture or focal lesion. Sinuses/Orbits: No acute finding. Other: None CT CERVICAL SPINE FINDINGS Alignment: No acute subluxation. There is straightening of normal cervical lordosis which may be positional or due to muscle spasm. Skull base and vertebrae: No acute fracture.  Osteopenia. Soft tissues and spinal canal: No prevertebral fluid or swelling. No visible canal hematoma. Disc levels: No acute findings. Degenerative changes. C5-C6 bony ankylosis. There is C6 C7 ACDF. Upper chest: Negative. Other: Bilateral carotid bulb calcified plaques. IMPRESSION: 1. No acute intracranial pathology. Mild chronic microvascular ischemic changes. 2. No acute/traumatic cervical spine pathology. Electronically Signed   By: Elgie Collard M.D.   On: 02/01/2023 19:03   CT Cervical Spine Wo Contrast  Result Date: 02/01/2023 CLINICAL DATA:  Trauma. EXAM: CT HEAD WITHOUT CONTRAST CT CERVICAL SPINE WITHOUT CONTRAST TECHNIQUE: Multidetector CT imaging of the head and cervical spine was performed following the standard protocol without intravenous contrast. Multiplanar CT image reconstructions of the cervical spine were also generated. RADIATION DOSE REDUCTION: This exam was performed according to the departmental dose-optimization program which includes automated exposure control, adjustment of the mA and/or kV according to patient size and/or use of iterative reconstruction technique. COMPARISON:  None Available. FINDINGS: CT HEAD FINDINGS Brain: The ventricles and sulci are appropriate size for patient's age. Mild  periventricular and deep white matter chronic microvascular ischemic changes noted. There is no acute intracranial hemorrhage. No mass effect or midline shift. No extra-axial fluid collection. Vascular: No hyperdense vessel or unexpected calcification. Skull: Normal. Negative for fracture or focal lesion. Sinuses/Orbits: No acute finding. Other: None CT CERVICAL SPINE FINDINGS Alignment: No acute subluxation. There is straightening of normal cervical lordosis which may be positional or due to muscle spasm. Skull base and vertebrae: No acute fracture.  Osteopenia. Soft tissues and spinal canal: No prevertebral fluid or swelling. No visible canal hematoma. Disc levels: No acute findings. Degenerative changes. C5-C6 bony ankylosis. There is C6 C7 ACDF. Upper chest: Negative. Other: Bilateral carotid bulb calcified plaques. IMPRESSION: 1. No acute intracranial pathology. Mild chronic microvascular ischemic changes. 2. No acute/traumatic cervical spine pathology. Electronically Signed   By: Elgie Collard M.D.   On: 02/01/2023 19:03    EKG: Independently reviewed. Sinus rhythm, incomplete RBBB, QTc 501 ms.   Assessment/Plan   1. Left hip fracture  - Continue pain-control, npo after midnight, supportive care  - Based on the available data, Rachael Austin presents an estimated 0.5% risk of perioperative MI or cardiac arrest; no further preoperative cardiac evaluation needed    2. Hypertensive urgency  - SBP 200 in setting of severe pain  - Continue pain-control, hydralazine as at home, and additional hydralazine as needed    3. HLD  - Continue statin   4. Prolonged QT interval  - QTc 501 ms in ED - Supplement potassium, check magnesium level, avoid QT-prolonging medications    DVT prophylaxis: SCDs  Code Status: Full  Level of Care: Level of care: Med-Surg Family Communication: Multiple family members at bedside  Disposition Plan:  Patient is from: Home  Anticipated d/c is to: TBD Anticipated  d/c date is: 02/05/23  Patient currently: Pending ortho consult and  likely operative hip repair  Consults called: Orthopedic surgery  Admission status: Inpatient     Briscoe Deutscher, MD [facility]  02/01/2023, 8:17 PM

## 2023-02-01 NOTE — ED Triage Notes (Signed)
Pt arrives to 11h via EMS from home s/p witnessed mechanical fall. Pt did hit head, did land on L hip, no LOC, does not take blood thinners. Pt endorsing pain to front and back of head and L groin. Shortening noted to L leg.   Given fentanyl en route VSS 160 CBG

## 2023-02-01 NOTE — ED Notes (Signed)
Pt to imaging at this time.

## 2023-02-01 NOTE — ED Provider Notes (Signed)
[facility] Provider Note    Event Date/Time   First MD Initiated Contact with Patient 02/01/23 1951     (approximate)   History   Chief Complaint: Fall   HPI  Rachael Austin is a 82 y.o. female with a history of basal cell carcinoma, squamous cell carcinoma, GERD, hypertension who comes ED due to mechanical fall at home.  Patient lost her balance, fell onto her left side.  Complains of pain in the left hip.  Also hit her head, no loss of consciousness.     Physical Exam   Triage Vital Signs: ED Triage Vitals  Enc Vitals Group     BP 02/01/23 1754 (!) 187/80     Pulse Rate 02/01/23 1754 82     Resp 02/01/23 1754 15     Temp --      Temp src --      SpO2 02/01/23 1754 99 %     Weight 02/01/23 1756 142 lb (64.4 kg)     Height 02/01/23 1756 5\' 4"  (1.626 m)     Head Circumference --      Peak Flow --      Pain Score 02/01/23 1755 5     Pain Loc --      Pain Edu? --      Excl. in GC? --     Most recent vital signs: Vitals:   02/01/23 1930 02/01/23 2000  BP: (!) 201/79 (!) 191/66  Pulse: 84 98  Resp: 18 18  SpO2: 100% 100%    General: Awake, no distress.  CV:  Good peripheral perfusion.  Regular rate rhythm Resp:  Normal effort.  Clear to auscultation bilaterally Abd:  No distention.  Soft nontender Other:  Tenderness at the left hip.  No obvious limb shortening.  Mild contusion of the left frontal scalp without laceration.  Mild diffuse tenderness of the cervical spine   ED Results / Procedures / Treatments   Labs (all labs ordered are listed, but only abnormal results are displayed) Labs Reviewed  BASIC METABOLIC PANEL - Abnormal; Notable for the following components:      Result Value   Glucose, Bld 109 (*)    All other components within normal limits  CBC WITH DIFFERENTIAL/PLATELET  CBC  BASIC METABOLIC PANEL  MAGNESIUM     EKG Interpreted by me Sinus rhythm rate of 84.  Normal axis, normal intervals.  Borderline  right bundle branch block.  Normal ST segments and T waves.  Nonischemic.   RADIOLOGY X-ray left hip interpreted by me, shows impacted subcapital fracture of the left femur.  Radiology report reviewed  Chest x-ray negative  CT head and neck negative for intracranial hemorrhage or C-spine fracture or other acute injuries.   PROCEDURES:  Procedures   MEDICATIONS ORDERED IN ED: Medications  hydrALAZINE (APRESOLINE) tablet 25 mg (has no administration in time range)  pravastatin (PRAVACHOL) tablet 20 mg (has no administration in time range)  pantoprazole (PROTONIX) EC tablet 40 mg (has no administration in time range)  morphine (PF) 2 MG/ML injection 1-2 mg (has no administration in time range)  HYDROcodone-acetaminophen (NORCO/VICODIN) 5-325 MG per tablet 1 tablet (has no administration in time range)  hydrALAZINE (APRESOLINE) tablet 25 mg (has no administration in time range)  sodium chloride 0.9 % bolus 500 mL (0 mLs Intravenous Stopped 02/01/23 2020)  morphine (PF) 2 MG/ML injection 2 mg (2 mg Intravenous Given 02/01/23 1852)  hydrALAZINE (APRESOLINE) injection 10 mg (10 mg  Intravenous Given 02/01/23 1942)  morphine (PF) 4 MG/ML injection 4 mg (4 mg Intravenous Given 02/01/23 2017)     IMPRESSION / MDM / ASSESSMENT AND PLAN / ED COURSE  I reviewed the triage vital signs and the nursing notes.  DDx: Hip fracture, intracranial hemorrhage, C-spine fracture, electrolyte abnormality, AKI, anemia  Patient's presentation is most consistent with acute presentation with potential threat to life or bodily function.  Patient presents with mechanical fall at home resulting in head trauma, left hip pain.  Will get x-rays and CT scan of the head.   Clinical Course as of 02/01/23 2024  Wynelle Link Feb 01, 2023  1925 Left hip x-ray shows an impacted subcapital fracture.  Radiology report reviewed.  [PS]    Clinical Course User Index [PS] Sharman Cheek, MD     ----------------------------------------- 11:59 PM on 02/01/2023 ----------------------------------------- Orthopedics Dr. Martha Clan contacted regarding x-ray finding of subcapital fracture.  He advises that CT of the hip is not needed at this time.  Case discussed with hospitalist for further management.   FINAL CLINICAL IMPRESSION(S) / ED DIAGNOSES   Final diagnoses:  Subcapital fracture of hip, left, closed, initial encounter [facility])     Rx / DC Orders   ED Discharge Orders     None        Note:  This document was prepared using Dragon voice recognition software and may include unintentional dictation errors.   Sharman Cheek, MD 02/01/23 [phone]

## 2023-02-02 ENCOUNTER — Encounter: Payer: Self-pay | Admitting: Internal Medicine

## 2023-02-02 ENCOUNTER — Other Ambulatory Visit: Payer: Self-pay

## 2023-02-02 ENCOUNTER — Encounter
Admission: EM | Disposition: A | Discharge status: Skilled Nursing Facility | Payer: Self-pay | Source: Home / Self Care | Attending: Internal Medicine

## 2023-02-02 ENCOUNTER — Inpatient Hospital Stay: Payer: Medicare PPO

## 2023-02-02 ENCOUNTER — Inpatient Hospital Stay: Payer: Medicare PPO | Admitting: Anesthesiology

## 2023-02-02 ENCOUNTER — Encounter: Payer: Self-pay | Admitting: Family Medicine

## 2023-02-02 DIAGNOSIS — S72002A Fracture of unspecified part of neck of left femur, initial encounter for closed fracture: Secondary | ICD-10-CM | POA: Diagnosis not present

## 2023-02-02 DIAGNOSIS — Z8781 Personal history of (healed) traumatic fracture: Secondary | ICD-10-CM | POA: Insufficient documentation

## 2023-02-02 DIAGNOSIS — S72009A Fracture of unspecified part of neck of unspecified femur, initial encounter for closed fracture: Secondary | ICD-10-CM | POA: Insufficient documentation

## 2023-02-02 HISTORY — PX: HIP PINNING,CANNULATED: SHX1758

## 2023-02-02 LAB — CBC
HCT: 40.2 % (ref 36.0–46.0)
Hemoglobin: 13.3 g/dL (ref 12.0–15.0)
MCH: 28.9 pg (ref 26.0–34.0)
MCHC: 33.1 g/dL (ref 30.0–36.0)
MCV: 87.4 fL (ref 80.0–100.0)
Platelets: 204 10*3/uL (ref 150–400)
RBC: 4.6 MIL/uL (ref 3.87–5.11)
RDW: 14.6 % (ref 11.5–15.5)
WBC: 11.2 10*3/uL — ABNORMAL HIGH (ref 4.0–10.5)
nRBC: 0 % (ref 0.0–0.2)

## 2023-02-02 LAB — BASIC METABOLIC PANEL
Anion gap: 10 (ref 5–15)
BUN: 9 mg/dL (ref 8–23)
CO2: 22 mmol/L (ref 22–32)
Calcium: 8.5 mg/dL — ABNORMAL LOW (ref 8.9–10.3)
Chloride: 106 mmol/L (ref 98–111)
Creatinine, Ser: 0.72 mg/dL (ref 0.44–1.00)
GFR, Estimated: >60 mL/min (ref >60)
Glucose, Bld: 175 mg/dL — ABNORMAL HIGH (ref 70–99)
Potassium: 3.7 mmol/L (ref 3.5–5.1)
Sodium: 138 mmol/L (ref 135–145)

## 2023-02-02 LAB — MAGNESIUM: Magnesium: 2.2 mg/dL (ref 1.7–2.4)

## 2023-02-02 SURGERY — FIXATION, FEMUR, NECK, PERCUTANEOUS, USING SCREW
Anesthesia: General | Site: Hip | Laterality: Left

## 2023-02-02 MED ORDER — CEFAZOLIN SODIUM-DEXTROSE 2-4 GM/100ML-% IV SOLN
INTRAVENOUS | Status: AC
  Filled 2023-02-02: qty 100

## 2023-02-02 MED ORDER — ALUM & MAG HYDROXIDE-SIMETH 200-200-20 MG/5ML PO SUSP
30.0000 mL | ORAL | Status: DC | PRN
  Administered 2023-02-03: 30 mL via ORAL
  Filled 2023-02-02 (×2): qty 30

## 2023-02-02 MED ORDER — SUCCINYLCHOLINE CHLORIDE 200 MG/10ML IV SOSY
PREFILLED_SYRINGE | INTRAVENOUS | Status: AC
  Filled 2023-02-02: qty 10

## 2023-02-02 MED ORDER — ACETAMINOPHEN 325 MG PO TABS
325.0000 mg | ORAL_TABLET | Freq: Four times a day (QID) | ORAL | Status: DC | PRN
  Administered 2023-02-10 – 2023-02-11 (×2): 650 mg via ORAL
  Filled 2023-02-02 (×2): qty 2

## 2023-02-02 MED ORDER — LIDOCAINE HCL (CARDIAC) PF 100 MG/5ML IV SOSY
PREFILLED_SYRINGE | INTRAVENOUS | Status: DC | PRN
  Administered 2023-02-02: 60 mg via INTRAVENOUS

## 2023-02-02 MED ORDER — ACETAMINOPHEN 10 MG/ML IV SOLN
INTRAVENOUS | Status: AC
  Filled 2023-02-02: qty 100

## 2023-02-02 MED ORDER — ENSURE ENLIVE PO LIQD
237.0000 mL | Freq: Two times a day (BID) | ORAL | Status: DC
  Administered 2023-02-03 (×2): 237 mL via ORAL

## 2023-02-02 MED ORDER — SUCCINYLCHOLINE CHLORIDE 200 MG/10ML IV SOSY
PREFILLED_SYRINGE | INTRAVENOUS | Status: DC | PRN
  Administered 2023-02-02: 80 mg via INTRAVENOUS

## 2023-02-02 MED ORDER — PHENYLEPHRINE 80 MCG/ML (10ML) SYRINGE FOR IV PUSH (FOR BLOOD PRESSURE SUPPORT)
PREFILLED_SYRINGE | INTRAVENOUS | Status: DC | PRN
  Administered 2023-02-02: 120 ug via INTRAVENOUS

## 2023-02-02 MED ORDER — FENTANYL CITRATE (PF) 100 MCG/2ML IJ SOLN
INTRAMUSCULAR | Status: AC
  Filled 2023-02-02: qty 2

## 2023-02-02 MED ORDER — BISACODYL 10 MG RE SUPP
10.0000 mg | Freq: Every day | RECTAL | Status: DC | PRN
  Administered 2023-02-06: 10 mg via RECTAL
  Filled 2023-02-02: qty 1

## 2023-02-02 MED ORDER — PHENOL 1.4 % MT LIQD
1.0000 | OROMUCOSAL | Status: DC | PRN
  Administered 2023-02-06: 1 via OROMUCOSAL
  Filled 2023-02-02: qty 177

## 2023-02-02 MED ORDER — DOCUSATE SODIUM 100 MG PO CAPS
100.0000 mg | ORAL_CAPSULE | Freq: Two times a day (BID) | ORAL | Status: DC
  Administered 2023-02-02 – 2023-02-11 (×6): 100 mg via ORAL
  Filled 2023-02-02 (×12): qty 1

## 2023-02-02 MED ORDER — PHENYLEPHRINE HCL-NACL 20-0.9 MG/250ML-% IV SOLN
INTRAVENOUS | Status: DC | PRN
  Administered 2023-02-02: 40 ug/min via INTRAVENOUS

## 2023-02-02 MED ORDER — LACTATED RINGERS IV SOLN: INTRAVENOUS | Status: DC

## 2023-02-02 MED ORDER — ONDANSETRON HCL 4 MG/2ML IJ SOLN
INTRAMUSCULAR | Status: AC
  Filled 2023-02-02: qty 2

## 2023-02-02 MED ORDER — HYDROCODONE-ACETAMINOPHEN 5-325 MG PO TABS
1.0000 | ORAL_TABLET | ORAL | Status: DC | PRN
  Administered 2023-02-02: 1 via ORAL
  Administered 2023-02-09: 2 via ORAL
  Filled 2023-02-02 (×2): qty 1
  Filled 2023-02-02 (×2): qty 2

## 2023-02-02 MED ORDER — PROPOFOL 10 MG/ML IV BOLUS
INTRAVENOUS | Status: DC | PRN
  Administered 2023-02-02: 100 mg via INTRAVENOUS
  Administered 2023-02-02: 50 mg via INTRAVENOUS

## 2023-02-02 MED ORDER — MORPHINE SULFATE (PF) 2 MG/ML IV SOLN
0.5000 mg | INTRAVENOUS | Status: DC | PRN
  Administered 2023-02-03 – 2023-02-09 (×12): 1 mg via INTRAVENOUS
  Filled 2023-02-02 (×12): qty 1

## 2023-02-02 MED ORDER — ADULT MULTIVITAMIN W/MINERALS CH
1.0000 | ORAL_TABLET | Freq: Every day | ORAL | Status: DC
  Administered 2023-02-05 – 2023-02-12 (×4): 1 via ORAL
  Filled 2023-02-02 (×6): qty 1

## 2023-02-02 MED ORDER — ROCURONIUM BROMIDE 100 MG/10ML IV SOLN
INTRAVENOUS | Status: DC | PRN
  Administered 2023-02-02: 50 mg via INTRAVENOUS

## 2023-02-02 MED ORDER — SENNA 8.6 MG PO TABS
1.0000 | ORAL_TABLET | Freq: Two times a day (BID) | ORAL | Status: DC
  Administered 2023-02-02 – 2023-02-11 (×6): 8.6 mg via ORAL
  Filled 2023-02-02 (×13): qty 1

## 2023-02-02 MED ORDER — ONDANSETRON HCL 4 MG/2ML IJ SOLN
INTRAMUSCULAR | Status: DC | PRN
  Administered 2023-02-02: 4 mg via INTRAVENOUS

## 2023-02-02 MED ORDER — ACETAMINOPHEN 10 MG/ML IV SOLN: 1000.0000 mg | Freq: Once | INTRAVENOUS | Status: DC | PRN

## 2023-02-02 MED ORDER — ENOXAPARIN SODIUM 40 MG/0.4ML IJ SOSY: 40.0000 mg | PREFILLED_SYRINGE | INTRAMUSCULAR | Status: DC

## 2023-02-02 MED ORDER — ACETAMINOPHEN 500 MG PO TABS
500.0000 mg | ORAL_TABLET | Freq: Four times a day (QID) | ORAL | Status: AC
  Administered 2023-02-02 – 2023-02-03 (×4): 500 mg via ORAL
  Filled 2023-02-02 (×4): qty 1

## 2023-02-02 MED ORDER — SUGAMMADEX SODIUM 200 MG/2ML IV SOLN
INTRAVENOUS | Status: DC | PRN
  Administered 2023-02-02: 200 mg via INTRAVENOUS

## 2023-02-02 MED ORDER — PROPOFOL 10 MG/ML IV BOLUS
INTRAVENOUS | Status: AC
  Filled 2023-02-02: qty 20

## 2023-02-02 MED ORDER — DEXAMETHASONE SODIUM PHOSPHATE 10 MG/ML IJ SOLN
INTRAMUSCULAR | Status: DC | PRN
  Administered 2023-02-02: 10 mg via INTRAVENOUS

## 2023-02-02 MED ORDER — METHOCARBAMOL 500 MG PO TABS: 500.0000 mg | ORAL_TABLET | Freq: Four times a day (QID) | ORAL | Status: DC | PRN

## 2023-02-02 MED ORDER — FENTANYL CITRATE (PF) 100 MCG/2ML IJ SOLN
25.0000 ug | INTRAMUSCULAR | Status: DC | PRN
  Administered 2023-02-02 (×2): 25 ug via INTRAVENOUS

## 2023-02-02 MED ORDER — FENTANYL CITRATE (PF) 100 MCG/2ML IJ SOLN
INTRAMUSCULAR | Status: DC | PRN
  Administered 2023-02-02 (×2): 50 ug via INTRAVENOUS

## 2023-02-02 MED ORDER — MENTHOL 3 MG MT LOZG: 1.0000 | LOZENGE | OROMUCOSAL | Status: DC | PRN

## 2023-02-02 MED ORDER — CEFAZOLIN SODIUM-DEXTROSE 2-4 GM/100ML-% IV SOLN
2.0000 g | Freq: Four times a day (QID) | INTRAVENOUS | Status: AC
  Administered 2023-02-02 – 2023-02-03 (×2): 2 g via INTRAVENOUS
  Filled 2023-02-02 (×2): qty 100

## 2023-02-02 MED ORDER — POLYETHYLENE GLYCOL 3350 17 G PO PACK
17.0000 g | PACK | Freq: Every day | ORAL | Status: DC | PRN
  Administered 2023-02-03: 17 g via ORAL
  Filled 2023-02-02: qty 1

## 2023-02-02 MED ORDER — STERILE WATER FOR IRRIGATION IR SOLN
Status: DC | PRN
  Administered 2023-02-02: 1000 mL

## 2023-02-02 MED ORDER — ACETAMINOPHEN 10 MG/ML IV SOLN
INTRAVENOUS | Status: DC | PRN
  Administered 2023-02-02: 1000 mg via INTRAVENOUS

## 2023-02-02 MED ORDER — METHOCARBAMOL 1000 MG/10ML IJ SOLN
500.0000 mg | Freq: Four times a day (QID) | INTRAVENOUS | Status: DC | PRN
  Administered 2023-02-05 – 2023-02-07 (×4): 500 mg via INTRAVENOUS
  Filled 2023-02-02: qty 500
  Filled 2023-02-02: qty 5
  Filled 2023-02-02 (×2): qty 500

## 2023-02-02 MED ORDER — OXYCODONE HCL 5 MG/5ML PO SOLN: 5.0000 mg | Freq: Once | ORAL | Status: DC | PRN

## 2023-02-02 MED ORDER — LACTATED RINGERS IV SOLN: INTRAVENOUS | Status: DC | PRN

## 2023-02-02 MED ORDER — LIDOCAINE HCL (PF) 2 % IJ SOLN
INTRAMUSCULAR | Status: AC
  Filled 2023-02-02: qty 5

## 2023-02-02 SURGICAL SUPPLY — 35 items
BIT DRILL CANN LRG QC 5X300 (BIT) IMPLANT
BLADE SURG SZ10 CARB STEEL (BLADE) ×1 IMPLANT
BNDG COHESIVE 6X5 TAN ST LF (GAUZE/BANDAGES/DRESSINGS) ×2 IMPLANT
DRAPE SURG 17X11 SM STRL (DRAPES) ×2 IMPLANT
DRAPE U-SHAPE 47X51 STRL (DRAPES) ×1 IMPLANT
DRSG OPSITE POSTOP 3X4 (GAUZE/BANDAGES/DRESSINGS) IMPLANT
DRSG OPSITE POSTOP 4X6 (GAUZE/BANDAGES/DRESSINGS) IMPLANT
DURAPREP 26ML APPLICATOR (WOUND CARE) ×2 IMPLANT
ELECT REM PT RETURN 9FT ADLT (ELECTROSURGICAL) ×1
ELECTRODE REM PT RTRN 9FT ADLT (ELECTROSURGICAL) ×1 IMPLANT
GAUZE SPONGE 4X4 12PLY STRL (GAUZE/BANDAGES/DRESSINGS) ×1 IMPLANT
GLOVE BIOGEL PI IND STRL 9 (GLOVE) ×1 IMPLANT
GLOVE BIOGEL PI ORTHO SZ9 (GLOVE) ×4 IMPLANT
GOWN STRL REUS TWL 2XL XL LVL4 (GOWN DISPOSABLE) ×1 IMPLANT
GOWN STRL REUS W/ TWL LRG LVL3 (GOWN DISPOSABLE) ×1 IMPLANT
GOWN STRL REUS W/TWL LRG LVL3 (GOWN DISPOSABLE) ×1
GUIDEWIRE THREADED 2.8 (WIRE) IMPLANT
HOLDER FOLEY CATH W/STRAP (MISCELLANEOUS) IMPLANT
MANIFOLD NEPTUNE II (INSTRUMENTS) ×1 IMPLANT
MAT ABSORB  FLUID 56X50 GRAY (MISCELLANEOUS) ×1
MAT ABSORB FLUID 56X50 GRAY (MISCELLANEOUS) ×1 IMPLANT
NS IRRIG 1000ML POUR BTL (IV SOLUTION) ×1 IMPLANT
PACK HIP COMPR (MISCELLANEOUS) ×1 IMPLANT
SCREW CANN 16 THRD/85 6.5 (Screw) IMPLANT
SCREW CANN 16 THRD/90 6.5 (Screw) IMPLANT
SCREW CANN 16 THRD/95 6.5 (Screw) IMPLANT
SCREW CANN 6.5X75MM (Screw) IMPLANT
STAPLER SKIN PROX 35W (STAPLE) ×1 IMPLANT
STRAP SAFETY 5IN WIDE (MISCELLANEOUS) ×1 IMPLANT
SUT VIC AB 0 CT1 36 (SUTURE) ×1 IMPLANT
SUT VIC AB 2-0 CT2 27 (SUTURE) ×1 IMPLANT
SUT VICRYL 0 UR6 27IN ABS (SUTURE) ×2 IMPLANT
TRAP FLUID SMOKE EVACUATOR (MISCELLANEOUS) ×1 IMPLANT
TRAY FOLEY MTR SLVR 16FR STAT (SET/KITS/TRAYS/PACK) IMPLANT
WATER STERILE IRR 500ML POUR (IV SOLUTION) ×1 IMPLANT

## 2023-02-02 NOTE — Progress Notes (Signed)
Initial Nutrition Assessment  DOCUMENTATION CODES:   Not applicable  INTERVENTION:   -Once diet is advanced, add:   -Ensure Enlive po BID, each supplement provides 350 kcal and 20 grams of protein -MVI with minerals daily  NUTRITION DIAGNOSIS:   Increased nutrient needs related to post-op healing as evidenced by estimated needs.  GOAL:   Patient will meet greater than or equal to 90% of their needs  MONITOR:   PO intake, Supplement acceptance, Diet advancement  REASON FOR ASSESSMENT:   Consult Assessment of nutrition requirement/status, Hip fracture protocol  ASSESSMENT:   Pt with medical history significant for hypertension, hyperlipidemia, and interstitial cystitis patient presents with severe left groin pain and headache after mechanical fall at home.  Pt admitted with lt hip fracture s/p fall.   Reviewed I/O's: -230 ml x 24 hours   Per orthopedics notes, plan for percutaneous fixation today. Pt currently NPO for procedure.   Spoke with pt and daughter at bedside. Pt was drowsy and daughter provided most of the history. Pt is very active and independent at baseline. She has great family support and [place] on a compound with multiple family members. Pt with a good appetite and consumed 3 meals per day. Per daughter, pt will sometimes only eat small portions. Pt very eager to eat after surgery.   Reviewed wt hx; pt has experienced a 2.9% wt loss over the past 6 months, which is not significant for time frame. Pt denies any weight loss.   Discussed importance of good meal and supplement intake to promote healing. Pt amenable to supplements.   Medications reviewed.   Labs reviewed.  NUTRITION - FOCUSED PHYSICAL EXAM:  Flowsheet Row Most Recent Value  Orbital Region No depletion  Upper Arm Region Mild depletion  Thoracic and Lumbar Region No depletion  Buccal Region No depletion  Temple Region No depletion  Clavicle Bone Region No depletion  Clavicle and  Acromion Bone Region No depletion  Scapular Bone Region No depletion  Dorsal Hand Mild depletion  Patellar Region Mild depletion  Anterior Thigh Region Mild depletion  Posterior Calf Region Mild depletion  Edema (RD Assessment) None  Hair Reviewed  Eyes Reviewed  Mouth Reviewed  Skin Reviewed  Nails Reviewed       Diet Order:   Diet Order             Diet NPO time specified Except for: Sips with Meds, Ice Chips  Diet effective midnight                   EDUCATION NEEDS:   Education needs have been addressed  Skin:  Skin Assessment: Skin Integrity Issues: Skin Integrity Issues:: Incisions Incisions: closed lt hip  Last BM:  Unknown  Height:   Ht Readings from Last 1 Encounters:  02/01/23 5\' 4"  (1.626 m)    Weight:   Wt Readings from Last 1 Encounters:  02/01/23 64.4 kg    Ideal Body Weight:  54.5 kg  BMI:  Body mass index is 24.37 kg/m.  Estimated Nutritional Needs:   Kcal:  1600-1800  Protein:  80-95 grams  Fluid:  > 1.6 L    Levada Schilling, RD, LDN, CDCES Registered Dietitian II Certified Diabetes Care and Education Specialist Please refer to [facility] for RD and/or RD on-call/weekend/after hours pager

## 2023-02-02 NOTE — Progress Notes (Signed)
Subjective:  POST OP CHECK s/p percutaneous fixation for right impacted femoral neck hip fracture.   Patient reports right pain as mild to moderate.  Currently using a rebreather mask.  Objective:   VITALS:   Vitals:   02/02/23 1530 02/02/23 1545 02/02/23 1630 02/02/23 1736  BP: (!) 139/58 (!) 123/54 (!) 121/50   Pulse: 93 87 93   Resp: 20 (!) 23 20   Temp:   98.2 F (36.8 C)   TempSrc:   Axillary   SpO2: 91% 94% 94% 95%  Weight:      Height:        PHYSICAL EXAM: Left lower extremity Neurovascular intact Sensation intact distally Intact pulses distally Dorsiflexion/Plantar flexion intact Incision: dressing C/D/I No cellulitis present Compartment soft  LABS  Results for orders placed or performed during the hospital encounter of 02/01/23 (from the past 24 hour(s))  Basic metabolic panel     Status: Abnormal   Collection Time: 02/01/23  6:13 PM  Result Value Ref Range   Sodium 138 135 - 145 mmol/L   Potassium 3.7 3.5 - 5.1 mmol/L   Chloride 104 98 - 111 mmol/L   CO2 26 22 - 32 mmol/L   Glucose, Bld 109 (H) 70 - 99 mg/dL   BUN 11 8 - 23 mg/dL   Creatinine, Ser 6.64 0.44 - 1.00 mg/dL   Calcium 9.0 8.9 - 40.3 mg/dL   GFR, Estimated >47 >42 mL/min   Anion gap 8 5 - 15  CBC with Differential     Status: None   Collection Time: 02/01/23  6:13 PM  Result Value Ref Range   WBC 7.2 4.0 - 10.5 K/uL   RBC 4.32 3.87 - 5.11 MIL/uL   Hemoglobin 12.5 12.0 - 15.0 g/dL   HCT 59.5 63.8 - 75.6 %   MCV 88.9 80.0 - 100.0 fL   MCH 28.9 26.0 - 34.0 pg   MCHC 32.6 30.0 - 36.0 g/dL   RDW 43.3 29.5 - 18.8 %   Platelets 226 150 - 400 K/uL   nRBC 0.0 0.0 - 0.2 %   Neutrophils Relative % 65 %   Neutro Abs 4.6 1.7 - 7.7 K/uL   Lymphocytes Relative 25 %   Lymphs Abs 1.8 0.7 - 4.0 K/uL   Monocytes Relative 8 %   Monocytes Absolute 0.6 0.1 - 1.0 K/uL   Eosinophils Relative 1 %   Eosinophils Absolute 0.1 0.0 - 0.5 K/uL   Basophils Relative 0 %   Basophils Absolute 0.0 0.0 - 0.1  K/uL   Immature Granulocytes 1 %   Abs Immature Granulocytes 0.04 0.00 - 0.07 K/uL  CBC     Status: Abnormal   Collection Time: 02/02/23  4:25 AM  Result Value Ref Range   WBC 11.2 (H) 4.0 - 10.5 K/uL   RBC 4.60 3.87 - 5.11 MIL/uL   Hemoglobin 13.3 12.0 - 15.0 g/dL   HCT 41.6 60.6 - 30.1 %   MCV 87.4 80.0 - 100.0 fL   MCH 28.9 26.0 - 34.0 pg   MCHC 33.1 30.0 - 36.0 g/dL   RDW 60.1 09.3 - 23.5 %   Platelets 204 150 - 400 K/uL   nRBC 0.0 0.0 - 0.2 %  Basic metabolic panel     Status: Abnormal   Collection Time: 02/02/23  4:25 AM  Result Value Ref Range   Sodium 138 135 - 145 mmol/L   Potassium 3.7 3.5 - 5.1 mmol/L   Chloride 106 98 - 111  mmol/L   CO2 22 22 - 32 mmol/L   Glucose, Bld 175 (H) 70 - 99 mg/dL   BUN 9 8 - 23 mg/dL   Creatinine, Ser 1.61 0.44 - 1.00 mg/dL   Calcium 8.5 (L) 8.9 - 10.3 mg/dL   GFR, Estimated >09 >60 mL/min   Anion gap 10 5 - 15  Magnesium     Status: None   Collection Time: 02/02/23  4:25 AM  Result Value Ref Range   Magnesium 2.2 1.7 - 2.4 mg/dL    DG HIP UNILAT WITH PELVIS 2-3 VIEWS LEFT  Result Date: 02/02/2023 CLINICAL DATA:  Postop. EXAM: DG HIP (WITH OR WITHOUT PELVIS) 2-3V LEFT COMPARISON:  Preoperative radiograph yesterday FINDINGS: Four screws traverse left femoral neck fracture. Unchanged fracture alignment from preoperative imaging. Recent postsurgical change includes air and edema in the soft tissues. Lateral skin staples in place. IMPRESSION: ORIF left femoral neck fracture. No immediate postoperative complication. Electronically Signed   By: Narda Rutherford M.D.   On: 02/02/2023 15:52   DG HIP UNILAT WITH PELVIS 2-3 VIEWS LEFT  Result Date: 02/02/2023 CLINICAL DATA:  [id number] Surgery, elective [id number] EXAM: DG HIP (WITH OR WITHOUT PELVIS) 2-3V LEFT COMPARISON:  [id number] FINDINGS: Intraoperative images during pin fixation of the left femoral neck for a subcapital femoral neck fracture. Stable alignment. No evidence of immediate hardware  complication. IMPRESSION: Intraoperative images during left femoral neck fixation. No evidence of immediate hardware complication. Electronically Signed   By: Caprice Renshaw M.D.   On: 02/02/2023 15:06   DG C-Arm 1-60 Min-No Report  Result Date: 02/02/2023 Fluoroscopy was utilized by the requesting physician.  No radiographic interpretation.   DG Chest 1 View  Result Date: 02/01/2023 CLINICAL DATA:  Left hip pain after fall. EXAM: CHEST  1 VIEW COMPARISON:  Chest radiograph dated 06/26/2022. FINDINGS: No focal consolidation, pleural effusion, or pneumothorax. The cardiac silhouette is within normal limits. No acute osseous pathology. Lower cervical ACDF. IMPRESSION: No active disease. Electronically Signed   By: Elgie Collard M.D.   On: 02/01/2023 19:09   DG Hip Unilat W or Wo Pelvis 2-3 Views Left  Result Date: 02/01/2023 CLINICAL DATA:  Fall and left hip pain. EXAM: DG HIP (WITH OR WITHOUT PELVIS) 2-3V LEFT COMPARISON:  None Available. FINDINGS: Evaluation for fracture is limited due to osteopenia. The wrist angulation of the left femoral neck most consistent with a nondisplaced impacted subcapital fracture. Further evaluation with CT is recommended. No dislocation. The soft tissues are unremarkable. IMPRESSION: Nondisplaced impacted subcapital fracture of the left femur. Further evaluation with CT is recommended. Electronically Signed   By: Elgie Collard M.D.   On: 02/01/2023 19:08   CT Head Wo Contrast  Result Date: 02/01/2023 CLINICAL DATA:  Trauma. EXAM: CT HEAD WITHOUT CONTRAST CT CERVICAL SPINE WITHOUT CONTRAST TECHNIQUE: Multidetector CT imaging of the head and cervical spine was performed following the standard protocol without intravenous contrast. Multiplanar CT image reconstructions of the cervical spine were also generated. RADIATION DOSE REDUCTION: This exam was performed according to the departmental dose-optimization program which includes automated exposure control, adjustment of the  mA and/or kV according to patient size and/or use of iterative reconstruction technique. COMPARISON:  None Available. FINDINGS: CT HEAD FINDINGS Brain: The ventricles and sulci are appropriate size for patient's age. Mild periventricular and deep white matter chronic microvascular ischemic changes noted. There is no acute intracranial hemorrhage. No mass effect or midline shift. No extra-axial fluid collection. Vascular: No hyperdense vessel or unexpected calcification.  Skull: Normal. Negative for fracture or focal lesion. Sinuses/Orbits: No acute finding. Other: None CT CERVICAL SPINE FINDINGS Alignment: No acute subluxation. There is straightening of normal cervical lordosis which may be positional or due to muscle spasm. Skull base and vertebrae: No acute fracture.  Osteopenia. Soft tissues and spinal canal: No prevertebral fluid or swelling. No visible canal hematoma. Disc levels: No acute findings. Degenerative changes. C5-C6 bony ankylosis. There is C6 C7 ACDF. Upper chest: Negative. Other: Bilateral carotid bulb calcified plaques. IMPRESSION: 1. No acute intracranial pathology. Mild chronic microvascular ischemic changes. 2. No acute/traumatic cervical spine pathology. Electronically Signed   By: Elgie Collard M.D.   On: 02/01/2023 19:03   CT Cervical Spine Wo Contrast  Result Date: 02/01/2023 CLINICAL DATA:  Trauma. EXAM: CT HEAD WITHOUT CONTRAST CT CERVICAL SPINE WITHOUT CONTRAST TECHNIQUE: Multidetector CT imaging of the head and cervical spine was performed following the standard protocol without intravenous contrast. Multiplanar CT image reconstructions of the cervical spine were also generated. RADIATION DOSE REDUCTION: This exam was performed according to the departmental dose-optimization program which includes automated exposure control, adjustment of the mA and/or kV according to patient size and/or use of iterative reconstruction technique. COMPARISON:  None Available. FINDINGS: CT HEAD  FINDINGS Brain: The ventricles and sulci are appropriate size for patient's age. Mild periventricular and deep white matter chronic microvascular ischemic changes noted. There is no acute intracranial hemorrhage. No mass effect or midline shift. No extra-axial fluid collection. Vascular: No hyperdense vessel or unexpected calcification. Skull: Normal. Negative for fracture or focal lesion. Sinuses/Orbits: No acute finding. Other: None CT CERVICAL SPINE FINDINGS Alignment: No acute subluxation. There is straightening of normal cervical lordosis which may be positional or due to muscle spasm. Skull base and vertebrae: No acute fracture.  Osteopenia. Soft tissues and spinal canal: No prevertebral fluid or swelling. No visible canal hematoma. Disc levels: No acute findings. Degenerative changes. C5-C6 bony ankylosis. There is C6 C7 ACDF. Upper chest: Negative. Other: Bilateral carotid bulb calcified plaques. IMPRESSION: 1. No acute intracranial pathology. Mild chronic microvascular ischemic changes. 2. No acute/traumatic cervical spine pathology. Electronically Signed   By: Elgie Collard M.D.   On: 02/01/2023 19:03    Assessment/Plan: Day of Surgery   Principal Problem:   Closed left hip fracture, initial encounter [facility]) Active Problems:   Hypercholesterolemia   Hypertensive urgency   Prolonged QT interval   Patient with increased oxygen requirement postop.  Using a rebreather currently.  Patient is drowsy but arousable and follows commands.  Postoperative x-rays demonstrate the impacted femoral neck hip fracture is now well-fixed with 4 x 6.5 mm cannulated screws.  Patient will have her Foley catheter removed in the morning.  She will begin physical and occupational therapy tomorrow using a walker and 25% partial weightbearing on the left lower extremity.  Labs will be drawn in the morning.   Juanell Fairly , MD 02/02/2023, 6:04 PM

## 2023-02-02 NOTE — Transfer of Care (Signed)
Immediate Anesthesia Transfer of Care Note  Patient: Rachael Austin  Procedure(s) Performed: PERCUTANEOUS FIXATION OF FEMORAL NECK (Left: Hip)  Patient Location: PACU  Anesthesia Type:General  Level of Consciousness: drowsy  Airway & Oxygen Therapy: Patient Spontanous Breathing and Patient connected to face mask oxygen  Post-op Assessment: Report given to RN, Post -op Vital signs reviewed and stable, and Patient moving all extremities  Post vital signs: Reviewed and stable  Last Vitals:  Vitals Value Taken Time  BP 164/59 02/02/23 1438  Temp    Pulse 91 02/02/23 1441  Resp 17 02/02/23 1441  SpO2 95 % 02/02/23 1441  Vitals shown include unvalidated device data.  Last Pain:  Vitals:   02/02/23 1206  TempSrc: Temporal  PainSc: 0-No pain      Patients Stated Pain Goal: 2 (02/02/23 0132)  Complications: No notable events documented.

## 2023-02-02 NOTE — Anesthesia Procedure Notes (Signed)
Procedure Name: Intubation Date/Time: 02/02/2023 1:13 PM  Performed by: Katherine Basset, CRNAPre-anesthesia Checklist: Patient identified, Emergency Drugs available, Suction available and Patient being monitored Patient Re-evaluated:Patient Re-evaluated prior to induction Oxygen Delivery Method: Circle system utilized Preoxygenation: Pre-oxygenation with 100% oxygen Induction Type: IV induction and Rapid sequence Ventilation: Mask ventilation without difficulty Laryngoscope Size: Miller and 2 Grade View: Grade I Tube type: Oral Tube size: 7.0 mm Number of attempts: 1 Airway Equipment and Method: Stylet, Oral airway and Bite block Placement Confirmation: ETT inserted through vocal cords under direct vision, positive ETCO2 and breath sounds checked- equal and bilateral Secured at: 21 cm Tube secured with: Tape Dental Injury: Teeth and Oropharynx as per pre-operative assessment  Comments: Pt w/liquid in oropharynx on induction, head down, suctioned immediately, ETT placed w/o difficulty, OGT placed after intubation, Pt tolerated throughout

## 2023-02-02 NOTE — Progress Notes (Signed)
PROGRESS NOTE    Rachael Austin  YQM:[id number]  DOB: October 19, 1940  DOA: 02/01/2023 PCP: [name], MD Outpatient Specialists:   Hospital course:  82 year old female with HTN and interstitial cystitis was admitted with left hip fracture after a mechanical fall.   Subjective:  Patient is sleeping.  She is arousable by voice alone and will open her eyes a little bit and say she knows she is in her room at [facility] and is able to identify her daughter and goes right back to sleep.   Objective: Vitals:   02/02/23 1530 02/02/23 1545 02/02/23 1630 02/02/23 1736  BP: (!) 139/58 (!) 123/54 (!) 121/50   Pulse: 93 87 93   Resp: 20 (!) 23 20   Temp:   98.2 F (36.8 C)   TempSrc:   Axillary   SpO2: 91% 94% 94% 95%  Weight:      Height:        Intake/Output Summary (Last 24 hours) at 02/02/2023 1742 Last data filed at 02/02/2023 1425 Gross per 24 hour  Intake 1320 ml  Output 2470 ml  Net -1150 ml   Filed Weights   02/01/23 1756  Weight: 64.4 kg     Exam:  General: Sleeping female with nonrebreather mask on face and NARD, Eyes: sclera anicteric, conjuctiva mild injection bilaterally CVS: S1-S2, regular  Respiratory:  decreased air entry bilaterally secondary to decreased inspiratory effort GI: NABS, soft, NT  LE: Ecchymoses noted, no edema  Data Reviewed:  Basic Metabolic Panel: Recent Labs  Lab 02/01/23 1813 02/02/23 0425  NA 138 138  K 3.7 3.7  CL 104 106  CO2 26 22  GLUCOSE 109* 175*  BUN 11 9  CREATININE 0.88 0.72  CALCIUM 9.0 8.5*  MG  --  2.2    CBC: Recent Labs  Lab 02/01/23 1813 02/02/23 0425  WBC 7.2 11.2*  NEUTROABS 4.6  --   HGB 12.5 13.3  HCT 38.4 40.2  MCV 88.9 87.4  PLT 226 204     Scheduled Meds:  [START ON 02/03/2023] feeding supplement  237 mL Oral BID BM   hydrALAZINE  25 mg Oral BID   [START ON 02/03/2023] multivitamin with minerals  1 tablet Oral Daily   pantoprazole  40 mg Oral Daily   pravastatin  20 mg Oral  q1800   Continuous Infusions:   Assessment & Plan:   Left hip fracture Oxygen requirement postop Patient is status post percutaneous fixation of left femoral neck fracture earlier today Patient is still somnolent postop, has just received pain medications Increased oxygen requirement likely secondary to somnolence from narcotics Will follow closely overnight Pain management and DVT prophylaxis per orthopedics  HTN BP under good control on hydralazine at present She had come in with hypertensive urgency thought to be secondary to severe pain     DVT prophylaxis: Per orthopedics Code Status: Full Family Communication: Patient's daughter was at bedside throughout      Studies: DG HIP UNILAT WITH PELVIS 2-3 VIEWS LEFT  Result Date: 02/02/2023 CLINICAL DATA:  Postop. EXAM: DG HIP (WITH OR WITHOUT PELVIS) 2-3V LEFT COMPARISON:  Preoperative radiograph yesterday FINDINGS: Four screws traverse left femoral neck fracture. Unchanged fracture alignment from preoperative imaging. Recent postsurgical change includes air and edema in the soft tissues. Lateral skin staples in place. IMPRESSION: ORIF left femoral neck fracture. No immediate postoperative complication. Electronically Signed   By: Narda Rutherford M.D.   On: 02/02/2023 15:52   DG HIP UNILAT WITH  PELVIS 2-3 VIEWS LEFT  Result Date: 02/02/2023 CLINICAL DATA:  [id number] Surgery, elective [id number] EXAM: DG HIP (WITH OR WITHOUT PELVIS) 2-3V LEFT COMPARISON:  [id number] FINDINGS: Intraoperative images during pin fixation of the left femoral neck for a subcapital femoral neck fracture. Stable alignment. No evidence of immediate hardware complication. IMPRESSION: Intraoperative images during left femoral neck fixation. No evidence of immediate hardware complication. Electronically Signed   By: Caprice Renshaw M.D.   On: 02/02/2023 15:06   DG C-Arm 1-60 Min-No Report  Result Date: 02/02/2023 Fluoroscopy was utilized by the requesting physician.  No  radiographic interpretation.   DG Chest 1 View  Result Date: 02/01/2023 CLINICAL DATA:  Left hip pain after fall. EXAM: CHEST  1 VIEW COMPARISON:  Chest radiograph dated 06/26/2022. FINDINGS: No focal consolidation, pleural effusion, or pneumothorax. The cardiac silhouette is within normal limits. No acute osseous pathology. Lower cervical ACDF. IMPRESSION: No active disease. Electronically Signed   By: Elgie Collard M.D.   On: 02/01/2023 19:09   DG Hip Unilat W or Wo Pelvis 2-3 Views Left  Result Date: 02/01/2023 CLINICAL DATA:  Fall and left hip pain. EXAM: DG HIP (WITH OR WITHOUT PELVIS) 2-3V LEFT COMPARISON:  None Available. FINDINGS: Evaluation for fracture is limited due to osteopenia. The wrist angulation of the left femoral neck most consistent with a nondisplaced impacted subcapital fracture. Further evaluation with CT is recommended. No dislocation. The soft tissues are unremarkable. IMPRESSION: Nondisplaced impacted subcapital fracture of the left femur. Further evaluation with CT is recommended. Electronically Signed   By: Elgie Collard M.D.   On: 02/01/2023 19:08   CT Head Wo Contrast  Result Date: 02/01/2023 CLINICAL DATA:  Trauma. EXAM: CT HEAD WITHOUT CONTRAST CT CERVICAL SPINE WITHOUT CONTRAST TECHNIQUE: Multidetector CT imaging of the head and cervical spine was performed following the standard protocol without intravenous contrast. Multiplanar CT image reconstructions of the cervical spine were also generated. RADIATION DOSE REDUCTION: This exam was performed according to the departmental dose-optimization program which includes automated exposure control, adjustment of the mA and/or kV according to patient size and/or use of iterative reconstruction technique. COMPARISON:  None Available. FINDINGS: CT HEAD FINDINGS Brain: The ventricles and sulci are appropriate size for patient's age. Mild periventricular and deep white matter chronic microvascular ischemic changes noted. There is  no acute intracranial hemorrhage. No mass effect or midline shift. No extra-axial fluid collection. Vascular: No hyperdense vessel or unexpected calcification. Skull: Normal. Negative for fracture or focal lesion. Sinuses/Orbits: No acute finding. Other: None CT CERVICAL SPINE FINDINGS Alignment: No acute subluxation. There is straightening of normal cervical lordosis which may be positional or due to muscle spasm. Skull base and vertebrae: No acute fracture.  Osteopenia. Soft tissues and spinal canal: No prevertebral fluid or swelling. No visible canal hematoma. Disc levels: No acute findings. Degenerative changes. C5-C6 bony ankylosis. There is C6 C7 ACDF. Upper chest: Negative. Other: Bilateral carotid bulb calcified plaques. IMPRESSION: 1. No acute intracranial pathology. Mild chronic microvascular ischemic changes. 2. No acute/traumatic cervical spine pathology. Electronically Signed   By: Elgie Collard M.D.   On: 02/01/2023 19:03   CT Cervical Spine Wo Contrast  Result Date: 02/01/2023 CLINICAL DATA:  Trauma. EXAM: CT HEAD WITHOUT CONTRAST CT CERVICAL SPINE WITHOUT CONTRAST TECHNIQUE: Multidetector CT imaging of the head and cervical spine was performed following the standard protocol without intravenous contrast. Multiplanar CT image reconstructions of the cervical spine were also generated. RADIATION DOSE REDUCTION: This exam was performed according to the departmental  dose-optimization program which includes automated exposure control, adjustment of the mA and/or kV according to patient size and/or use of iterative reconstruction technique. COMPARISON:  None Available. FINDINGS: CT HEAD FINDINGS Brain: The ventricles and sulci are appropriate size for patient's age. Mild periventricular and deep white matter chronic microvascular ischemic changes noted. There is no acute intracranial hemorrhage. No mass effect or midline shift. No extra-axial fluid collection. Vascular: No hyperdense vessel or  unexpected calcification. Skull: Normal. Negative for fracture or focal lesion. Sinuses/Orbits: No acute finding. Other: None CT CERVICAL SPINE FINDINGS Alignment: No acute subluxation. There is straightening of normal cervical lordosis which may be positional or due to muscle spasm. Skull base and vertebrae: No acute fracture.  Osteopenia. Soft tissues and spinal canal: No prevertebral fluid or swelling. No visible canal hematoma. Disc levels: No acute findings. Degenerative changes. C5-C6 bony ankylosis. There is C6 C7 ACDF. Upper chest: Negative. Other: Bilateral carotid bulb calcified plaques. IMPRESSION: 1. No acute intracranial pathology. Mild chronic microvascular ischemic changes. 2. No acute/traumatic cervical spine pathology. Electronically Signed   By: Elgie Collard M.D.   On: 02/01/2023 19:03    Principal Problem:   Closed left hip fracture, initial encounter [facility]) Active Problems:   Hypercholesterolemia   Hypertensive urgency   Prolonged QT interval      Orma Flaming, [facility]  If 7PM-7AM, please contact night-coverage [email or web address]   LOS: 1 day

## 2023-02-02 NOTE — Anesthesia Postprocedure Evaluation (Signed)
Anesthesia Post Note  Patient: Rachael Austin  Procedure(s) Performed: PERCUTANEOUS FIXATION OF FEMORAL NECK (Left: Hip)  Patient location during evaluation: PACU Anesthesia Type: General Level of consciousness: awake and alert, oriented and patient cooperative Pain management: pain level controlled Vital Signs Assessment: post-procedure vital signs reviewed and stable Respiratory status: spontaneous breathing, nonlabored ventilation and respiratory function stable Cardiovascular status: blood pressure returned to baseline and stable Postop Assessment: adequate PO intake Anesthetic complications: no   No notable events documented.   Last Vitals:  Vitals:   02/02/23 1439 02/02/23 1504  BP: (!) 164/59   Pulse: 91 85  Resp: 15 13  Temp: 36.7 C   SpO2: 93% 97%    Last Pain:  Vitals:   02/02/23 1439  TempSrc:   PainSc: Asleep                 Reed Breech

## 2023-02-02 NOTE — Consult Note (Signed)
ORTHOPAEDIC CONSULTATION  REQUESTING PHYSICIAN: Salomon Mast*  Chief Complaint: Left impacted femoral neck hip fracture  HPI: Rachael Austin is a 82 y.o. female who was seen in her hospital room this morning with her daughter at the bedside.  Patient is complaining of left hip pain.  Patient was admitted last night after x-rays revealed an impacted femoral neck hip fracture after a fall.  No other injuries are reported.  Orthopedics is consulted for management of her fracture.  Past Medical History:  Diagnosis Date   AK (actinic keratosis) 11/12/2022   left upper arm, tx'd with EDC   AK (actinic keratosis) 11/12/2022   right medial pretibia, LN2 11/25/22   Basal cell carcinoma 06/21/2020   left post shoulder sup, left mid chest, left lat thigh   Basal cell carcinoma 11/07/2021   L upper back, EDC   Basal cell carcinoma 11/07/2021   R upper back, EDC   Basal cell carcinoma 11/27/2021   right upper arm, EDC   Basal cell carcinoma 11/27/2021   right shoulder posterior, EDC   Basal cell carcinoma 11/27/2021   right anterior shoulder, EDC   Basal cell carcinoma (BCC) 06/13/2021   left dorsal foot, EDC 10/02/2021   Basal cell carcinoma (BCC) 06/13/2021   right postauricular tx'd w/ EDC   BCC (basal cell carcinoma of skin) 03/30/2007   R inf med pretibial - BCC   BCC (basal cell carcinoma of skin) 10/12/2013   L nasal ala - BCC   BCC (basal cell carcinoma of skin) 03/10/2018   L mid dorsum med forearm - superficial BCC    BCC (basal cell carcinoma) 10/02/2021   left dorsal foot proximal, EDC 10/02/2021   BCC (basal cell carcinoma) 10/02/2021   left mid back, superficial EDC 11/07/21   BCC (basal cell carcinoma) 10/02/2021   right pretibia   BCC (basal cell carcinoma) 10/02/2021   right mid back, superficial EDC 11/07/21   BCC (basal cell carcinoma) 08/14/2022   left distal calf, tx'd with EDC   BCC (basal cell carcinoma) 11/12/2022   superficial at left lateral  pretibial,l tx'd with EDC   BCC (basal cell carcinoma) 11/12/2022   superficial at mid back right of midline, scheduled for EDC   BCC (basal cell carcinoma) 11/12/2022   mid back right of midline, [facility] 11/25/22   Breast screening, unspecified 2013   Cancer (HCC) 1992   skin   Degenerative disk disease    GERD (gastroesophageal reflux disease)    History of basal cell carcinoma (BCC) 08/29/2020    left inferior knee lateral, , left inferior knee medial, right pretibia   History of SCC (squamous cell carcinoma) of skin 08/29/2020   right posterior calf, left lateral calf, and    Hypercholesterolemia 2008   Interstitial cystitis    followed by Dr Achilles Dunk   Other sign and symptom in breast 2013   Left upper outer quadrant breast "soreness" Ultrasound exam of right breast in the 2 o'clock position with the breast distracted medially showed a 0.3-0.4 with 0.5 cm simple cyst. In the 1 o'clock position where pt reported tenderness US exam was negatiive.  Because of her history of intermittent nipple drainage, ultrasound was completed of the retroareolar area.   Personal history of tobacco use, presenting hazards to health    PONV (postoperative nausea and vomiting)    SCC (squamous cell carcinoma) 03/28/2008   R dorsum hand - SCC   SCC (squamous cell carcinoma) 05/09/2009   R lat  lower leg - SCCIS   SCC (squamous cell carcinoma) 05/09/2009   L lat lower leg - SCCIS   SCC (squamous cell carcinoma) 04/07/2013   L lower leg - SCCIS   SCC (squamous cell carcinoma) 04/27/2013   R forearm - SCC   SCC (squamous cell carcinoma) 04/28/2017   L lat knee - SCCIS   SCC (squamous cell carcinoma) 05/12/2018   L prox dorsum forearm - SCC   SCC (squamous cell carcinoma) 06/13/2021   left forearm tx'd w/ EDC   SCC (squamous cell carcinoma), leg, right 03/30/2007   R sup pretibial - SCCIS   SCC (squamous cell carcinoma);BCC 10/12/2013   Mid back - superficial BCC with SCCIS    Special screening for  malignant neoplasms, colon    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   left dorsal forearm, left lat calf   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   left lower leg superior, EDC at follow up   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   chest right of midline, tx'd with [facility]   Past Surgical History:  Procedure Laterality Date   ABDOMINAL HYSTERECTOMY  1992   APPENDECTOMY     CERVICAL DISCECTOMY     S/P C7-T1 discectomy with fusion   CHOLECYSTECTOMY  1990   COLONOSCOPY WITH PROPOFOL N/A 02/14/2016   Procedure: COLONOSCOPY WITH PROPOFOL;  Surgeon: Midge Minium, MD;  Location: [facility];  Service: Endoscopy;  Laterality: N/A;  PT WOULD LIKE 10 ARRIVAL TIME OR LATER   DILATION AND CURETTAGE OF UTERUS     ESOPHAGOGASTRODUODENOSCOPY (EGD) WITH PROPOFOL N/A 02/14/2016   Procedure: ESOPHAGOGASTRODUODENOSCOPY (EGD) WITH PROPOFOL with dialtion;  Surgeon: Midge Minium, MD;  Location: [facility];  Service: Endoscopy;  Laterality: N/A;   ESOPHAGOGASTRODUODENOSCOPY (EGD) WITH PROPOFOL N/A 04/13/2019   Procedure: ESOPHAGOGASTRODUODENOSCOPY (EGD) WITH PROPOFOL;  Surgeon: Toledo, Boykin Nearing, MD;  Location: [facility];  Service: Gastroenterology;  Laterality: N/A;   MELANOMA EXCISION     removed from Left calf 1994   Social History   Socioeconomic History   Marital status: Widowed    Spouse name: Not on file   Number of children: 2   Years of education: Not on file   Highest education level: Not on file  Occupational History   Not on file  Tobacco Use   Smoking status: Former    Packs/day: 1.00    Years: 15.00    Additional pack years: 0.00    Total pack years: 15.00    Types: Cigarettes   Smokeless tobacco: Never  Vaping Use   Vaping Use: Never used  Substance and Sexual Activity   Alcohol use: No    Alcohol/week: 0.0 standard drinks of alcohol   Drug use: No   Sexual activity: Not on file  Other Topics Concern   Not on file  Social History  Narrative   Married and has 2 children, daughters.   Social Determinants of Health   Financial Resource Strain: Not on file  Food Insecurity: Not on file  Transportation Needs: Not on file  Physical Activity: Not on file  Stress: Not on file  Social Connections: Not on file   Family History  Problem Relation Age of Onset   Hodgkin's lymphoma Mother    Heart failure Father    Heart attack Father    Arthritis Sister        Three sisters w/ degeneratve disk disease   Headache Sister  Two sisters hx of headache   Breast cancer Neg Hx    Colon cancer Neg Hx    Bladder Cancer Neg Hx    Kidney cancer Neg Hx    Allergies  Allergen Reactions   Augmentin [Amoxicillin-Pot Clavulanate] Swelling and Rash    Swelling of the lips   Lexapro [Escitalopram Oxalate]    Micardis [Telmisartan] Itching   Myrbetriq [Mirabegron] Itching   Niacin And Related Itching   Codeine Sulfate Other (See Comments)    "Makes her hyper"   Vesicare [Solifenacin Succinate] Nausea And Vomiting and Rash   Prior to Admission medications   Medication Sig Start Date End Date Taking? Authorizing Provider  acetaminophen (TYLENOL) 325 MG tablet Take 650 mg by mouth as needed.   Yes [provider]  esomeprazole (NEXIUM) 40 MG capsule Take 1 capsule (40 mg total) by mouth daily. 10/07/22  Yes Dale Houston, MD  hydrALAZINE (APRESOLINE) 25 MG tablet Take 1 tablet (25 mg total) by mouth 2 (two) times daily. 12/15/22  Yes Dale Wheatley, MD  imipramine (TOFRANIL) 10 MG tablet Take 1 tablet (10 mg total) by mouth at bedtime. 07/07/22  Yes Vanna Scotland, MD  lovastatin (MEVACOR) 20 MG tablet Take 1 tablet (20 mg total) by mouth at bedtime. 07/09/22  Yes Dale Holbrook, MD  sertraline (ZOLOFT) 50 MG tablet Take 1 tablet by mouth once daily 01/26/23  Yes Dale [place], MD  trospium (SANCTURA) 20 MG tablet Take 1 tablet (20 mg total) by mouth daily. 07/07/22  Yes Vanna Scotland, MD  nystatin cream  (MYCOSTATIN) Apply 1 application topically 2 (two) times daily. Patient not taking: Reported on 02/01/2023 05/21/21   Dale Pepper Pike, MD  silver sulfADIAZINE (SILVADENE) 1 % cream Apply 1 Application topically daily. Patient not taking: Reported on 02/01/2023 11/25/22   Neale Burly, [place], MD  triamcinolone ointment (KENALOG) 0.1 % Apply to affected area at right lower leg twice daily for up to 2 weeks as needed. Avoid applying to face, groin, and axilla. Use as directed. Long-term use can cause thinning of the skin. Patient not taking: Reported on 02/01/2023 10/23/22   Sandi Mealy, MD   DG Chest 1 View  Result Date: 02/01/2023 CLINICAL DATA:  Left hip pain after fall. EXAM: CHEST  1 VIEW COMPARISON:  Chest radiograph dated 06/26/2022. FINDINGS: No focal consolidation, pleural effusion, or pneumothorax. The cardiac silhouette is within normal limits. No acute osseous pathology. Lower cervical ACDF. IMPRESSION: No active disease. Electronically Signed   By: Elgie Collard M.D.   On: 02/01/2023 19:09   DG Hip Unilat W or Wo Pelvis 2-3 Views Left  Result Date: 02/01/2023 CLINICAL DATA:  Fall and left hip pain. EXAM: DG HIP (WITH OR WITHOUT PELVIS) 2-3V LEFT COMPARISON:  None Available. FINDINGS: Evaluation for fracture is limited due to osteopenia. The wrist angulation of the left femoral neck most consistent with a nondisplaced impacted subcapital fracture. Further evaluation with CT is recommended. No dislocation. The soft tissues are unremarkable. IMPRESSION: Nondisplaced impacted subcapital fracture of the left femur. Further evaluation with CT is recommended. Electronically Signed   By: Elgie Collard M.D.   On: 02/01/2023 19:08   CT Head Wo Contrast  Result Date: 02/01/2023 CLINICAL DATA:  Trauma. EXAM: CT HEAD WITHOUT CONTRAST CT CERVICAL SPINE WITHOUT CONTRAST TECHNIQUE: Multidetector CT imaging of the head and cervical spine was performed following the standard protocol without intravenous contrast.  Multiplanar CT image reconstructions of the cervical spine were also generated. RADIATION DOSE REDUCTION: This exam was  performed according to the departmental dose-optimization program which includes automated exposure control, adjustment of the mA and/or kV according to patient size and/or use of iterative reconstruction technique. COMPARISON:  None Available. FINDINGS: CT HEAD FINDINGS Brain: The ventricles and sulci are appropriate size for patient's age. Mild periventricular and deep white matter chronic microvascular ischemic changes noted. There is no acute intracranial hemorrhage. No mass effect or midline shift. No extra-axial fluid collection. Vascular: No hyperdense vessel or unexpected calcification. Skull: Normal. Negative for fracture or focal lesion. Sinuses/Orbits: No acute finding. Other: None CT CERVICAL SPINE FINDINGS Alignment: No acute subluxation. There is straightening of normal cervical lordosis which may be positional or due to muscle spasm. Skull base and vertebrae: No acute fracture.  Osteopenia. Soft tissues and spinal canal: No prevertebral fluid or swelling. No visible canal hematoma. Disc levels: No acute findings. Degenerative changes. C5-C6 bony ankylosis. There is C6 C7 ACDF. Upper chest: Negative. Other: Bilateral carotid bulb calcified plaques. IMPRESSION: 1. No acute intracranial pathology. Mild chronic microvascular ischemic changes. 2. No acute/traumatic cervical spine pathology. Electronically Signed   By: Elgie Collard M.D.   On: 02/01/2023 19:03   CT Cervical Spine Wo Contrast  Result Date: 02/01/2023 CLINICAL DATA:  Trauma. EXAM: CT HEAD WITHOUT CONTRAST CT CERVICAL SPINE WITHOUT CONTRAST TECHNIQUE: Multidetector CT imaging of the head and cervical spine was performed following the standard protocol without intravenous contrast. Multiplanar CT image reconstructions of the cervical spine were also generated. RADIATION DOSE REDUCTION: This exam was performed according  to the departmental dose-optimization program which includes automated exposure control, adjustment of the mA and/or kV according to patient size and/or use of iterative reconstruction technique. COMPARISON:  None Available. FINDINGS: CT HEAD FINDINGS Brain: The ventricles and sulci are appropriate size for patient's age. Mild periventricular and deep white matter chronic microvascular ischemic changes noted. There is no acute intracranial hemorrhage. No mass effect or midline shift. No extra-axial fluid collection. Vascular: No hyperdense vessel or unexpected calcification. Skull: Normal. Negative for fracture or focal lesion. Sinuses/Orbits: No acute finding. Other: None CT CERVICAL SPINE FINDINGS Alignment: No acute subluxation. There is straightening of normal cervical lordosis which may be positional or due to muscle spasm. Skull base and vertebrae: No acute fracture.  Osteopenia. Soft tissues and spinal canal: No prevertebral fluid or swelling. No visible canal hematoma. Disc levels: No acute findings. Degenerative changes. C5-C6 bony ankylosis. There is C6 C7 ACDF. Upper chest: Negative. Other: Bilateral carotid bulb calcified plaques. IMPRESSION: 1. No acute intracranial pathology. Mild chronic microvascular ischemic changes. 2. No acute/traumatic cervical spine pathology. Electronically Signed   By: Elgie Collard M.D.   On: 02/01/2023 19:03    Positive ROS: All other systems have been reviewed and were otherwise negative with the exception of those mentioned in the HPI and as above.  Physical Exam: General: Patient resting comfortably in no acute distress  MUSCULOSKELETAL: Left lower extremity: Patient's skin is intact.  There is no erythema ecchymosis or swelling.  Her thigh and leg compartments are soft and compressible.  There is no significant shortening or rotation of the left lower extremity.  She had palpable pedal pulses, intact sensation light touch intact motor function  distally.  Assessment: Impacted left femoral neck hip fracture  Plan: Given that the patient has a nondisplaced and impacted left femoral neck hip fracture I have recommended percutaneous screw fixation of her fracture.  I discussed the details of the operation as well as the postoperative course with the patient and  her daughter. I also discussed the risks and benefits of surgery. The risks include but are not limited to infection, bleeding requiring blood transfusion, nerve or blood vessel injury, joint stiffness or loss of motion, persistent pain, weakness or instability, malunion, nonunion and hardware failure and the need for further surgery. Medical risks include but are not limited to DVT and pulmonary embolism, myocardial infarction, stroke, pneumonia, respiratory failure and death. Patient understood these risks and wished to proceed.   I reviewed the patient's x-rays and labs in preparation for this case.  Remain n.p.o. in preparation for surgery later today.  She should not receive any anticoagulation medication prior to surgery.   Juanell Fairly, MD    02/02/2023 8:13 AM

## 2023-02-02 NOTE — Op Note (Signed)
02/02/2023  3:10 PM  PATIENT:  Rachael Austin    PRE-OPERATIVE DIAGNOSIS:  left impacted femoral neck hip fracture  POST-OPERATIVE DIAGNOSIS:  Same  PROCEDURE:  PERCUTANEOUS FIXATION OF LEFT FEMORAL NECK HIP FRACTURE  SURGEON:  Juanell Fairly, MD  ANESTHESIA:   General  EBL: Minimal  PREOPERATIVE INDICATIONS:  Rachael Austin is a  82 y.o. female was admitted overnight through the emergency department after a fall and was found to have an impacted left femoral neck hip fracture. Percutaneous fixation was recommended to the patient for fixation of this non-displaced fracture.      The risks benefits and alternatives were discussed with the patient preoperatively including but not limited to infection, bleeding, nerve or blood vessel injury, persistent hip pain,  malunion, nonunion, avascular necrosis, change in lower extremity rotation, leg length discrepancy, failure of the hardware and the need for revision surgery, including the potential for conversion to hemi or total hip arthroplasty. Medical risks include but are not limited to: DVT and pulmonary embolism, myocardial infarction, stroke, pneumonia, respiratory failure and death.  The patient understood and agreed with the plan for surgery.  Patient had the left hip marked with the word yes and my initials according the hospitals correct site of surgery protocol.  OPERATIVE IMPLANTS: 6.5 mm cannulated screws x 4  OPERATIVE FINDINGS: Impacted nondisplaced left femoral neck hip fracture  OPERATIVE PROCEDURE: The patient was brought to the operating room where the patient underwent general anesthesia.  She was then placed supine on the fracture table. IV Kefzol x 2 grams was administered. The  lower extremity was positioned in a leg holder, without any significant reduction maneuver other than mild internal rotation.  The well leg was placed in hemi-lithotomy position. The hip was prepped and draped in usual sterile fashion.  A time  out was performed to verify the patient's name, date of birth, medical record number, correct site of surgery and correct surger to be performed. The timeout was also used to verify the patient had received antibiotics that all appropriate instruments, implants and radiographic studies were available in the room. Once all in attendance were in agreement case began..  Once was confirmed that there was no fracture displacement or angulation by C arm imaging, a small lateral incision was made in line with the femur, distal to the greater trochanter, and four threaded guidewires were introduced Into the lateral cortex of the femur, across the fracture site and into the femoral head in a diamond configuration. The lengths of these guidepins were measured with a depth gauge. The lateral cortex was then opened with a cannulated drill, and then the cannulated screws were advanced into position and tightened by hand. Solid fixation was achieved.  The guide pins were then removed and final C-arm images were taken of the fracture fixation. The fracture was well reduced and the hardware in good position.  The wound was irrigated copiously, and the deep and subcutaneous tissues were repaired with 0 and 2-0 Vicryl suture respectively and the skin was approximated with staples.  I was scrubbed and present the entire case and all sharp and instrument counts were correct at the conclusion of the case.  I spoke with the patient's family in the postop consultation room to let them know the case was completed without complication and the patient was stable in the recovery room.    Kathreen Devoid, MD

## 2023-02-02 NOTE — Anesthesia Preprocedure Evaluation (Addendum)
Anesthesia Evaluation  Patient identified by MRN, date of birth, ID band Patient awake    Reviewed: Allergy & Precautions, NPO status , Patient's Chart, lab work & pertinent test results  History of Anesthesia Complications (+) PONV and history of anesthetic complications  Airway Mallampati: IV   Neck ROM: Full    Dental  (+) Partial Upper   Pulmonary former smoker   Pulmonary exam normal breath sounds clear to auscultation       Cardiovascular hypertension, Normal cardiovascular exam Rhythm:Regular Rate:Normal  ECG 02/01/23:  Sinus rhythm Incomplete right bundle branch block Low voltage, precordial leads Minimal ST elevation, inferior leads   Neuro/Psych negative neurological ROS     GI/Hepatic hiatal hernia,GERD  ,,  Endo/Other  negative endocrine ROS    Renal/GU negative Renal ROS     Musculoskeletal  (+) Arthritis ,  Melanoma    Abdominal   Peds  Hematology negative hematology ROS (+)   Anesthesia Other Findings Patient has been vomiting today.  Reproductive/Obstetrics                             Anesthesia Physical Anesthesia Plan  ASA: 2  Anesthesia Plan: General   Post-op Pain Management:    Induction: Intravenous and Rapid sequence  PONV Risk Score and Plan: 4 or greater and Ondansetron, Dexamethasone and Treatment may vary due to age or medical condition  Airway Management Planned: Oral ETT  Additional Equipment:   Intra-op Plan:   Post-operative Plan: Extubation in OR  Informed Consent: I have reviewed the patients History and Physical, chart, labs and discussed the procedure including the risks, benefits and alternatives for the proposed anesthesia with the patient or authorized representative who has indicated his/her understanding and acceptance.     Dental advisory given  Plan Discussed with: CRNA  Anesthesia Plan Comments: (Patient consented for risks  of anesthesia including but not limited to:  - adverse reactions to medications - damage to eyes, teeth, lips or other oral mucosa - nerve damage due to positioning  - sore throat or hoarseness - damage to heart, brain, nerves, lungs, other parts of body or loss of life  Informed patient about role of CRNA in peri- and intra-operative care.  Patient voiced understanding.)        Anesthesia Quick Evaluation

## 2023-02-03 ENCOUNTER — Encounter: Payer: Self-pay | Admitting: Orthopedic Surgery

## 2023-02-03 ENCOUNTER — Inpatient Hospital Stay: Payer: Medicare PPO

## 2023-02-03 ENCOUNTER — Inpatient Hospital Stay (HOSPITAL_COMMUNITY)
Admit: 2023-02-03 | Discharge: 2023-02-03 | Disposition: A | Discharge status: Home / Self Care | Payer: Medicare PPO | Attending: Internal Medicine | Admitting: Internal Medicine

## 2023-02-03 DIAGNOSIS — R079 Chest pain, unspecified: Secondary | ICD-10-CM

## 2023-02-03 DIAGNOSIS — R9431 Abnormal electrocardiogram [ECG] [EKG]: Secondary | ICD-10-CM | POA: Diagnosis not present

## 2023-02-03 DIAGNOSIS — I10 Essential (primary) hypertension: Secondary | ICD-10-CM | POA: Diagnosis not present

## 2023-02-03 DIAGNOSIS — S72002A Fracture of unspecified part of neck of left femur, initial encounter for closed fracture: Secondary | ICD-10-CM | POA: Diagnosis not present

## 2023-02-03 LAB — CBC
HCT: 37 % (ref 36.0–46.0)
HCT: 37.5 % (ref 36.0–46.0)
Hemoglobin: 12.1 g/dL (ref 12.0–15.0)
Hemoglobin: 12.5 g/dL (ref 12.0–15.0)
MCH: 28.8 pg (ref 26.0–34.0)
MCH: 28.8 pg (ref 26.0–34.0)
MCHC: 32.7 g/dL (ref 30.0–36.0)
MCHC: 33.3 g/dL (ref 30.0–36.0)
MCV: 88.1 fL (ref 80.0–100.0)
MCV: 86.4 fL (ref 80.0–100.0)
Platelets: 192 10*3/uL (ref 150–400)
Platelets: 198 10*3/uL (ref 150–400)
RBC: 4.2 MIL/uL (ref 3.87–5.11)
RBC: 4.34 MIL/uL (ref 3.87–5.11)
RDW: 15 % (ref 11.5–15.5)
RDW: 14.7 % (ref 11.5–15.5)
WBC: 11.6 10*3/uL — ABNORMAL HIGH (ref 4.0–10.5)
WBC: 16 10*3/uL — ABNORMAL HIGH (ref 4.0–10.5)
nRBC: 0 % (ref 0.0–0.2)
nRBC: 0 % (ref 0.0–0.2)

## 2023-02-03 LAB — BASIC METABOLIC PANEL
Anion gap: 9 (ref 5–15)
Anion gap: 16 — ABNORMAL HIGH (ref 5–15)
BUN: 20 mg/dL (ref 8–23)
BUN: 25 mg/dL — ABNORMAL HIGH (ref 8–23)
CO2: 21 mmol/L — ABNORMAL LOW (ref 22–32)
CO2: 18 mmol/L — ABNORMAL LOW (ref 22–32)
Calcium: 8.5 mg/dL — ABNORMAL LOW (ref 8.9–10.3)
Calcium: 8.6 mg/dL — ABNORMAL LOW (ref 8.9–10.3)
Chloride: 104 mmol/L (ref 98–111)
Chloride: 97 mmol/L — ABNORMAL LOW (ref 98–111)
Creatinine, Ser: 1.35 mg/dL — ABNORMAL HIGH (ref 0.44–1.00)
Creatinine, Ser: 1.11 mg/dL — ABNORMAL HIGH (ref 0.44–1.00)
GFR, Estimated: 39 mL/min — ABNORMAL LOW (ref >60)
GFR, Estimated: 49 mL/min — ABNORMAL LOW (ref >60)
Glucose, Bld: 147 mg/dL — ABNORMAL HIGH (ref 70–99)
Glucose, Bld: 150 mg/dL — ABNORMAL HIGH (ref 70–99)
Potassium: 4 mmol/L (ref 3.5–5.1)
Potassium: 3.8 mmol/L (ref 3.5–5.1)
Sodium: 134 mmol/L — ABNORMAL LOW (ref 135–145)
Sodium: 131 mmol/L — ABNORMAL LOW (ref 135–145)

## 2023-02-03 LAB — ECHOCARDIOGRAM COMPLETE: Weight: 2272 oz

## 2023-02-03 LAB — GLUCOSE, CAPILLARY
Glucose-Capillary: 148 mg/dL — ABNORMAL HIGH (ref 70–99)
Glucose-Capillary: 144 mg/dL — ABNORMAL HIGH (ref 70–99)
Glucose-Capillary: 136 mg/dL — ABNORMAL HIGH (ref 70–99)

## 2023-02-03 LAB — TROPONIN I (HIGH SENSITIVITY): Troponin I (High Sensitivity): 15 ng/L (ref <18)

## 2023-02-03 MED ORDER — LABETALOL HCL 5 MG/ML IV SOLN
10.0000 mg | INTRAVENOUS | Status: DC | PRN
  Administered 2023-02-04 (×2): 10 mg via INTRAVENOUS
  Filled 2023-02-03 (×3): qty 4

## 2023-02-03 MED ORDER — METOPROLOL TARTRATE 5 MG/5ML IV SOLN: 5.0000 mg | Freq: Once | INTRAVENOUS | Status: DC

## 2023-02-03 MED ORDER — NITROGLYCERIN 0.4 MG SL SUBL
0.4000 mg | SUBLINGUAL_TABLET | SUBLINGUAL | Status: AC
  Administered 2023-02-03: 0.4 mg via SUBLINGUAL

## 2023-02-03 MED ORDER — HEPARIN BOLUS VIA INFUSION
3000.0000 [IU] | Freq: Once | INTRAVENOUS | Status: AC
  Administered 2023-02-03: 3000 [IU] via INTRAVENOUS
  Filled 2023-02-03: qty 3000

## 2023-02-03 MED ORDER — IOHEXOL 350 MG/ML SOLN
60.0000 mL | Freq: Once | INTRAVENOUS | Status: AC | PRN
  Administered 2023-02-03: 60 mL via INTRAVENOUS

## 2023-02-03 MED ORDER — HEPARIN (PORCINE) 25000 UT/250ML-% IV SOLN
750.0000 [IU]/h | INTRAVENOUS | Status: DC
  Administered 2023-02-03: 750 [IU]/h via INTRAVENOUS
  Filled 2023-02-03: qty 250

## 2023-02-03 MED ORDER — DIPHENHYDRAMINE HCL 50 MG/ML IJ SOLN
25.0000 mg | Freq: Once | INTRAMUSCULAR | Status: AC | PRN
  Administered 2023-02-03: 25 mg via INTRAVENOUS
  Filled 2023-02-03: qty 1

## 2023-02-03 MED ORDER — DILTIAZEM HCL-DEXTROSE 125-5 MG/125ML-% IV SOLN (PREMIX)
15.0000 mg/h | INTRAVENOUS | Status: DC
  Administered 2023-02-03: 7.5 mg/h via INTRAVENOUS
  Administered 2023-02-03: 5 mg/h via INTRAVENOUS
  Administered 2023-02-04: 7.5 mg/h via INTRAVENOUS
  Administered 2023-02-05: 15 mg/h via INTRAVENOUS
  Administered 2023-02-05: 7.5 mg/h via INTRAVENOUS
  Administered 2023-02-06 – 2023-02-10 (×11): 15 mg/h via INTRAVENOUS
  Filled 2023-02-03 (×16): qty 125

## 2023-02-03 MED ORDER — DILTIAZEM HCL 25 MG/5ML IV SOLN
10.0000 mg | Freq: Once | INTRAVENOUS | Status: AC
  Administered 2023-02-03: 10 mg via INTRAVENOUS
  Filled 2023-02-03: qty 5

## 2023-02-03 MED ORDER — PANTOPRAZOLE SODIUM 40 MG IV SOLR
40.0000 mg | Freq: Two times a day (BID) | INTRAVENOUS | Status: DC
  Administered 2023-02-03 – 2023-02-09 (×13): 40 mg via INTRAVENOUS
  Filled 2023-02-03 (×12): qty 10

## 2023-02-03 MED ORDER — METOPROLOL TARTRATE 5 MG/5ML IV SOLN
5.0000 mg | Freq: Once | INTRAVENOUS | Status: AC
  Administered 2023-02-03: 5 mg via INTRAVENOUS
  Filled 2023-02-03: qty 5

## 2023-02-03 MED ORDER — METOPROLOL TARTRATE 5 MG/5ML IV SOLN
2.5000 mg | Freq: Once | INTRAVENOUS | Status: AC
  Administered 2023-02-03: 2.5 mg via INTRAVENOUS
  Filled 2023-02-03: qty 5

## 2023-02-03 MED ORDER — SODIUM CHLORIDE 0.9 % IV BOLUS
1000.0000 mL | Freq: Once | INTRAVENOUS | Status: AC
  Administered 2023-02-03: 1000 mL via INTRAVENOUS

## 2023-02-03 MED ORDER — ENOXAPARIN SODIUM 30 MG/0.3ML IJ SOSY
30.0000 mg | PREFILLED_SYRINGE | INTRAMUSCULAR | Status: DC
  Administered 2023-02-03: 30 mg via SUBCUTANEOUS
  Filled 2023-02-03: qty 0.3

## 2023-02-03 MED ORDER — SODIUM CHLORIDE 0.9 % IV SOLN: INTRAVENOUS | Status: AC

## 2023-02-03 MED ORDER — SODIUM CHLORIDE 0.9 % IV SOLN
1.0000 g | INTRAVENOUS | Status: AC
  Administered 2023-02-04 – 2023-02-07 (×5): 1 g via INTRAVENOUS
  Filled 2023-02-03 (×4): qty 10
  Filled 2023-02-03: qty 1
  Filled 2023-02-03: qty 10

## 2023-02-03 NOTE — Progress Notes (Signed)
Subjective:  POD #1 s/p percutaneous fixation of left femoral neck hip fracture.   Patient reports left hip pain as mild to moderate.  Patient's daughter is at the bedside and states patient's major complaint is indigestion.  She was able to sit on the side of the bed today with physical therapy and need to get up to a chair earlier.  Objective:   VITALS:   Vitals:   02/02/23 2027 02/02/23 2312 02/03/23 0423 02/03/23 0952  BP: 120/60 135/67 (!) 124/56 (!) 156/58  Pulse: 99 (!) 102 (!) 102 (!) 103  Resp: 20 18 18 17   Temp: 97.7 F (36.5 C) 97.9 F (36.6 C) 99.1 F (37.3 C) 98.5 F (36.9 C)  TempSrc: Oral Oral Oral   SpO2: 92% 97% 93% 97%  Weight:      Height:        PHYSICAL EXAM: Left lower extremity Neurovascular intact Sensation intact distally Intact pulses distally Dorsiflexion/Plantar flexion intact Incision: dressing C/D/I No cellulitis present Compartment soft  LABS  Results for orders placed or performed during the hospital encounter of 02/01/23 (from the past 24 hour(s))  CBC     Status: Abnormal   Collection Time: 02/03/23  5:19 AM  Result Value Ref Range   WBC 11.6 (H) 4.0 - 10.5 K/uL   RBC 4.20 3.87 - 5.11 MIL/uL   Hemoglobin 12.1 12.0 - 15.0 g/dL   HCT 16.1 09.6 - 04.5 %   MCV 88.1 80.0 - 100.0 fL   MCH 28.8 26.0 - 34.0 pg   MCHC 32.7 30.0 - 36.0 g/dL   RDW 40.9 81.1 - 91.4 %   Platelets 192 150 - 400 K/uL   nRBC 0.0 0.0 - 0.2 %  Basic metabolic panel     Status: Abnormal   Collection Time: 02/03/23  5:19 AM  Result Value Ref Range   Sodium 134 (L) 135 - 145 mmol/L   Potassium 4.0 3.5 - 5.1 mmol/L   Chloride 104 98 - 111 mmol/L   CO2 21 (L) 22 - 32 mmol/L   Glucose, Bld 147 (H) 70 - 99 mg/dL   BUN 20 8 - 23 mg/dL   Creatinine, Ser 7.82 (H) 0.44 - 1.00 mg/dL   Calcium 8.5 (L) 8.9 - 10.3 mg/dL   GFR, Estimated 39 (L) >60 mL/min   Anion gap 9 5 - 15    DG HIP UNILAT WITH PELVIS 2-3 VIEWS LEFT  Result Date: 02/02/2023 CLINICAL DATA:  Postop.  EXAM: DG HIP (WITH OR WITHOUT PELVIS) 2-3V LEFT COMPARISON:  Preoperative radiograph yesterday FINDINGS: Four screws traverse left femoral neck fracture. Unchanged fracture alignment from preoperative imaging. Recent postsurgical change includes air and edema in the soft tissues. Lateral skin staples in place. IMPRESSION: ORIF left femoral neck fracture. No immediate postoperative complication. Electronically Signed   By: Narda Rutherford M.D.   On: 02/02/2023 15:52   DG HIP UNILAT WITH PELVIS 2-3 VIEWS LEFT  Result Date: 02/02/2023 CLINICAL DATA:  [id number] Surgery, elective [id number] EXAM: DG HIP (WITH OR WITHOUT PELVIS) 2-3V LEFT COMPARISON:  [id number] FINDINGS: Intraoperative images during pin fixation of the left femoral neck for a subcapital femoral neck fracture. Stable alignment. No evidence of immediate hardware complication. IMPRESSION: Intraoperative images during left femoral neck fixation. No evidence of immediate hardware complication. Electronically Signed   By: Caprice Renshaw M.D.   On: 02/02/2023 15:06   DG C-Arm 1-60 Min-No Report  Result Date: 02/02/2023 Fluoroscopy was utilized by the requesting physician.  No radiographic  interpretation.   DG Chest 1 View  Result Date: 02/01/2023 CLINICAL DATA:  Left hip pain after fall. EXAM: CHEST  1 VIEW COMPARISON:  Chest radiograph dated 06/26/2022. FINDINGS: No focal consolidation, pleural effusion, or pneumothorax. The cardiac silhouette is within normal limits. No acute osseous pathology. Lower cervical ACDF. IMPRESSION: No active disease. Electronically Signed   By: Elgie Collard M.D.   On: 02/01/2023 19:09   DG Hip Unilat W or Wo Pelvis 2-3 Views Left  Result Date: 02/01/2023 CLINICAL DATA:  Fall and left hip pain. EXAM: DG HIP (WITH OR WITHOUT PELVIS) 2-3V LEFT COMPARISON:  None Available. FINDINGS: Evaluation for fracture is limited due to osteopenia. The wrist angulation of the left femoral neck most consistent with a nondisplaced impacted  subcapital fracture. Further evaluation with CT is recommended. No dislocation. The soft tissues are unremarkable. IMPRESSION: Nondisplaced impacted subcapital fracture of the left femur. Further evaluation with CT is recommended. Electronically Signed   By: Elgie Collard M.D.   On: 02/01/2023 19:08   CT Head Wo Contrast  Result Date: 02/01/2023 CLINICAL DATA:  Trauma. EXAM: CT HEAD WITHOUT CONTRAST CT CERVICAL SPINE WITHOUT CONTRAST TECHNIQUE: Multidetector CT imaging of the head and cervical spine was performed following the standard protocol without intravenous contrast. Multiplanar CT image reconstructions of the cervical spine were also generated. RADIATION DOSE REDUCTION: This exam was performed according to the departmental dose-optimization program which includes automated exposure control, adjustment of the mA and/or kV according to patient size and/or use of iterative reconstruction technique. COMPARISON:  None Available. FINDINGS: CT HEAD FINDINGS Brain: The ventricles and sulci are appropriate size for patient's age. Mild periventricular and deep white matter chronic microvascular ischemic changes noted. There is no acute intracranial hemorrhage. No mass effect or midline shift. No extra-axial fluid collection. Vascular: No hyperdense vessel or unexpected calcification. Skull: Normal. Negative for fracture or focal lesion. Sinuses/Orbits: No acute finding. Other: None CT CERVICAL SPINE FINDINGS Alignment: No acute subluxation. There is straightening of normal cervical lordosis which may be positional or due to muscle spasm. Skull base and vertebrae: No acute fracture.  Osteopenia. Soft tissues and spinal canal: No prevertebral fluid or swelling. No visible canal hematoma. Disc levels: No acute findings. Degenerative changes. C5-C6 bony ankylosis. There is C6 C7 ACDF. Upper chest: Negative. Other: Bilateral carotid bulb calcified plaques. IMPRESSION: 1. No acute intracranial pathology. Mild chronic  microvascular ischemic changes. 2. No acute/traumatic cervical spine pathology. Electronically Signed   By: Elgie Collard M.D.   On: 02/01/2023 19:03   CT Cervical Spine Wo Contrast  Result Date: 02/01/2023 CLINICAL DATA:  Trauma. EXAM: CT HEAD WITHOUT CONTRAST CT CERVICAL SPINE WITHOUT CONTRAST TECHNIQUE: Multidetector CT imaging of the head and cervical spine was performed following the standard protocol without intravenous contrast. Multiplanar CT image reconstructions of the cervical spine were also generated. RADIATION DOSE REDUCTION: This exam was performed according to the departmental dose-optimization program which includes automated exposure control, adjustment of the mA and/or kV according to patient size and/or use of iterative reconstruction technique. COMPARISON:  None Available. FINDINGS: CT HEAD FINDINGS Brain: The ventricles and sulci are appropriate size for patient's age. Mild periventricular and deep white matter chronic microvascular ischemic changes noted. There is no acute intracranial hemorrhage. No mass effect or midline shift. No extra-axial fluid collection. Vascular: No hyperdense vessel or unexpected calcification. Skull: Normal. Negative for fracture or focal lesion. Sinuses/Orbits: No acute finding. Other: None CT CERVICAL SPINE FINDINGS Alignment: No acute subluxation. There is straightening  of normal cervical lordosis which may be positional or due to muscle spasm. Skull base and vertebrae: No acute fracture.  Osteopenia. Soft tissues and spinal canal: No prevertebral fluid or swelling. No visible canal hematoma. Disc levels: No acute findings. Degenerative changes. C5-C6 bony ankylosis. There is C6 C7 ACDF. Upper chest: Negative. Other: Bilateral carotid bulb calcified plaques. IMPRESSION: 1. No acute intracranial pathology. Mild chronic microvascular ischemic changes. 2. No acute/traumatic cervical spine pathology. Electronically Signed   By: Elgie Collard M.D.   On:  02/01/2023 19:03    Assessment/Plan: 1 Day Post-Op   Principal Problem:   Closed left hip fracture, initial encounter [facility]) Active Problems:   Hypercholesterolemia   Hypertensive urgency   Prolonged QT interval  Patient doing well from an orthopedic standpoint.  She will continue physical therapy.  Patient will begin Lovenox for DVT prophylaxis today.  She will need a skilled nursing facility upon discharge.    Juanell Fairly , MD 02/03/2023, 3:15 PM

## 2023-02-03 NOTE — Progress Notes (Signed)
Responded to rapid, family present. Daughter of pt outside room, no present needs. Staff actively in room. Will round back down to follow up with pt and daughter as requested.

## 2023-02-03 NOTE — Consult Note (Addendum)
Cardiology Consultation   [name] MRN: [id number]; DOB: 07-17-1941  Admit date: 02/01/2023 Date of Consult: 02/03/2023  PCP:  Dale Cherryvale, MD   [facility] Providers Cardiologist:  Dossie Arbour, MD PhD     Patient Profile:   Rachael Austin is a 82 y.o. female with a hx of hypertension, hyperlipidemia, aortic atherosclerosis, syncope, and recurrent falls, who is being seen 02/03/2023 for the evaluation of chest pain and abnormal EKG at the request of Dr. Luberta Robertson.  History of Present Illness:   Rachael Austin was admitted 2 days ago after losing her footing and falling on a small step.  She landed on her left side and also struck her head.  There was no loss of consciousness.  On arrival in the ED, she was noted to be hypertensive with a systolic blood pressure near 200 mmHg.  CT head and C-spine did not show any acute abnormalities.  However, left hip radiographs demonstrated a subcapital femoral neck fracture on the left.  She underwent percutaneous fixation of left femoral neck fracture with Dr. Martha Clan yesterday.  Rachael Austin developed chest pain radiating to the back earlier today (the patient does not know exactly what time and is currently somnolent from morphine that she received.  She describes it as a severe pressure radiating to the back that is different than anything she has felt in the past.  It was 7/10 at its worst but is currently 2/10.  She was evaluated by Dr. Mariah Milling in 2023 for shortness of breath and chest pain.  Echocardiogram did not show any significant abnormalities other than mild aortic stenosis.  She was to return in a month to discuss ischemia testing but was lost to follow-up.  She currently complains of aforementioned chest discomfort but otherwise is without complaints such as shortness of breath and lightheadedness.  Chest pain worsens when she takes a deep breath, though her daughter notes that this has been present since a  previous fall a few months ago resulting in rib fractures.  EKG was performed this afternoon demonstrating marked shifting QRS axis as well as change in R wave progression.   Past Medical History:  Diagnosis Date   AK (actinic keratosis) 11/12/2022   left upper arm, tx'd with EDC   AK (actinic keratosis) 11/12/2022   right medial pretibia, LN2 11/25/22   Basal cell carcinoma 06/21/2020   left post shoulder sup, left mid chest, left lat thigh   Basal cell carcinoma 11/07/2021   L upper back, EDC   Basal cell carcinoma 11/07/2021   R upper back, EDC   Basal cell carcinoma 11/27/2021   right upper arm, EDC   Basal cell carcinoma 11/27/2021   right shoulder posterior, EDC   Basal cell carcinoma 11/27/2021   right anterior shoulder, EDC   Basal cell carcinoma (BCC) 06/13/2021   left dorsal foot, EDC 10/02/2021   Basal cell carcinoma (BCC) 06/13/2021   right postauricular tx'd w/ EDC   BCC (basal cell carcinoma of skin) 03/30/2007   R inf med pretibial - BCC   BCC (basal cell carcinoma of skin) 10/12/2013   L nasal ala - BCC   BCC (basal cell carcinoma of skin) 03/10/2018   L mid dorsum med forearm - superficial BCC    BCC (basal cell carcinoma) 10/02/2021   left dorsal foot proximal, EDC 10/02/2021   BCC (basal cell carcinoma) 10/02/2021   left mid back, superficial EDC 11/07/21   BCC (basal cell carcinoma)  10/02/2021   right pretibia   BCC (basal cell carcinoma) 10/02/2021   right mid back, superficial EDC 11/07/21   BCC (basal cell carcinoma) 08/14/2022   left distal calf, tx'd with EDC   BCC (basal cell carcinoma) 11/12/2022   superficial at left lateral pretibial,l tx'd with EDC   BCC (basal cell carcinoma) 11/12/2022   superficial at mid back right of midline, scheduled for EDC   BCC (basal cell carcinoma) 11/12/2022   mid back right of midline, [facility] 11/25/22   Breast screening, unspecified 2013   Cancer (HCC) 1992   skin   Degenerative disk disease    GERD (gastroesophageal  reflux disease)    History of basal cell carcinoma (BCC) 08/29/2020    left inferior knee lateral, , left inferior knee medial, right pretibia   History of SCC (squamous cell carcinoma) of skin 08/29/2020   right posterior calf, left lateral calf, and    Hypercholesterolemia 2008   Interstitial cystitis    followed by Dr Achilles Dunk   Other sign and symptom in breast 2013   Left upper outer quadrant breast "soreness" Ultrasound exam of right breast in the 2 o'clock position with the breast distracted medially showed a 0.3-0.4 with 0.5 cm simple cyst. In the 1 o'clock position where pt reported tenderness US exam was negatiive.  Because of her history of intermittent nipple drainage, ultrasound was completed of the retroareolar area.   Personal history of tobacco use, presenting hazards to health    PONV (postoperative nausea and vomiting)    SCC (squamous cell carcinoma) 03/28/2008   R dorsum hand - SCC   SCC (squamous cell carcinoma) 05/09/2009   R lat lower leg - SCCIS   SCC (squamous cell carcinoma) 05/09/2009   L lat lower leg - SCCIS   SCC (squamous cell carcinoma) 04/07/2013   L lower leg - SCCIS   SCC (squamous cell carcinoma) 04/27/2013   R forearm - SCC   SCC (squamous cell carcinoma) 04/28/2017   L lat knee - SCCIS   SCC (squamous cell carcinoma) 05/12/2018   L prox dorsum forearm - SCC   SCC (squamous cell carcinoma) 06/13/2021   left forearm tx'd w/ EDC   SCC (squamous cell carcinoma), leg, right 03/30/2007   R sup pretibial - SCCIS   SCC (squamous cell carcinoma);BCC 10/12/2013   Mid back - superficial BCC with SCCIS    Special screening for malignant neoplasms, colon    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   left dorsal forearm, left lat calf   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   left lower leg superior, EDC at follow up   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   chest right of midline, tx'd with [facility]    Past Surgical History:   Procedure Laterality Date   ABDOMINAL HYSTERECTOMY  1992   APPENDECTOMY     CERVICAL DISCECTOMY     S/P C7-T1 discectomy with fusion   CHOLECYSTECTOMY  1990   COLONOSCOPY WITH PROPOFOL N/A 02/14/2016   Procedure: COLONOSCOPY WITH PROPOFOL;  Surgeon: Midge Minium, MD;  Location: [facility];  Service: Endoscopy;  Laterality: N/A;  PT WOULD LIKE 10 ARRIVAL TIME OR LATER   DILATION AND CURETTAGE OF UTERUS     ESOPHAGOGASTRODUODENOSCOPY (EGD) WITH PROPOFOL N/A 02/14/2016   Procedure: ESOPHAGOGASTRODUODENOSCOPY (EGD) WITH PROPOFOL with dialtion;  Surgeon: Midge Minium, MD;  Location: [facility];  Service: Endoscopy;  Laterality: N/A;   ESOPHAGOGASTRODUODENOSCOPY (EGD) WITH PROPOFOL N/A 04/13/2019  Procedure: ESOPHAGOGASTRODUODENOSCOPY (EGD) WITH PROPOFOL;  Surgeon: Toledo, Boykin Nearing, MD;  Location: [facility];  Service: Gastroenterology;  Laterality: N/A;   HIP PINNING,CANNULATED Left 02/02/2023   Procedure: PERCUTANEOUS FIXATION OF FEMORAL NECK;  Surgeon: Juanell Fairly, MD;  Location: [facility];  Service: Orthopedics;  Laterality: Left;   MELANOMA EXCISION     removed from Left calf 1994      Inpatient Medications: Scheduled Meds:  docusate sodium  100 mg Oral BID   enoxaparin (LOVENOX) injection  30 mg Subcutaneous Q24H   feeding supplement  237 mL Oral BID BM   hydrALAZINE  25 mg Oral BID   multivitamin with minerals  1 tablet Oral Daily   pantoprazole (PROTONIX) IV  40 mg Intravenous Q12H   pravastatin  20 mg Oral q1800   senna  1 tablet Oral BID   Continuous Infusions:  sodium chloride 100 mL/hr at 02/03/23 1741   methocarbamol (ROBAXIN) IV     PRN Meds: acetaminophen, alum & mag hydroxide-simeth, bisacodyl, hydrALAZINE, HYDROcodone-acetaminophen, labetalol, menthol-cetylpyridinium **OR** phenol, methocarbamol **OR** methocarbamol (ROBAXIN) IV, morphine injection, polyethylene glycol, prochlorperazine  Allergies:    Allergies  Allergen Reactions   Augmentin  [Amoxicillin-Pot Clavulanate] Swelling and Rash    Swelling of the lips   Lexapro [Escitalopram Oxalate]    Micardis [Telmisartan] Itching   Myrbetriq [Mirabegron] Itching   Niacin And Related Itching   Codeine Sulfate Other (See Comments)    "Makes her hyper"   Vesicare [Solifenacin Succinate] Nausea And Vomiting and Rash    Social History:   Social History   Tobacco Use   Smoking status: Former    Packs/day: 1.00    Years: 15.00    Additional pack years: 0.00    Total pack years: 15.00    Types: Cigarettes   Smokeless tobacco: Never  Vaping Use   Vaping Use: Never used  Substance Use Topics   Alcohol use: No    Alcohol/week: 0.0 standard drinks of alcohol   Drug use: No      Family History:   Family History  Problem Relation Age of Onset   Hodgkin's lymphoma Mother    Heart failure Father    Heart attack Father    Arthritis Sister        Three sisters w/ degeneratve disk disease   Headache Sister        Two sisters hx of headache   Breast cancer Neg Hx    Colon cancer Neg Hx    Bladder Cancer Neg Hx    Kidney cancer Neg Hx      ROS:  Please see the history of present illness. All other ROS reviewed and negative.     Physical Exam/Data:   Vitals:   02/03/23 1645 02/03/23 1715 02/03/23 1725 02/03/23 1731  BP: (!) 200/70 (!) 188/73 (!) 125/53 130/64  Pulse:      Resp:      Temp:      TempSrc:      SpO2:      Weight:      Height:       No intake or output data in the 24 hours ending 02/03/23 1821    02/01/2023    5:56 PM 11/04/2022   11:04 AM 08/07/2022   11:44 AM  Last 3 Weights  Weight (lbs) 142 lb 146 lb 12.8 oz 146 lb 3.2 oz  Weight (kg) 64.411 kg 66.588 kg 66.316 kg     Body mass index is 24.37 kg/m.  General: Elderly  woman lying in bed.  She is somnolent but appears comfortable. HEENT: normal Neck: no JVD Vascular: No carotid bruits; Distal pulses 2+ bilaterally Cardiac: Tachycardic and regular with occasional extrasystoles.  2/6 systolic  murmur present. Lungs: Coarse sounds bilaterally with scattered crackles. Abd: soft, nontender, no hepatomegaly  Ext: no edema Musculoskeletal: No obvious deformities. Skin: warm and dry  Neuro: No focal deficits noted though patient is somnolent.  EKG:  The EKG was personally reviewed and demonstrates: Sinus tachycardia with PACs, leftward axis, poor R wave progression, and possible inferior infarct.  Poor R wave progression and possible inferior infarct pattern are new from prior tracing on 02/01/2023.  Relevant CV Studies: Limited bedside echocardiogram demonstrates vigorous left ventricular systolic function.  No obvious wall Mountain abnormality is appreciated.  Question mild mitral regurgitation.  Aortic valve is moderately thickened and mildly calcified but does not appear to be severely stenotic.  Question slight RV hypokinesis.  No significant pericardial effusion identified.  Laboratory Data:  High Sensitivity Troponin:  No results for input(s): "TROPONINIHS" in the last 720 hours.   Chemistry Recent Labs  Lab 02/01/23 1813 02/02/23 0425 02/03/23 0519  NA 138 138 134*  K 3.7 3.7 4.0  CL 104 106 104  CO2 26 22 21*  GLUCOSE 109* 175* 147*  BUN 11 9 20   CREATININE 0.88 0.72 1.35*  CALCIUM 9.0 8.5* 8.5*  MG  --  2.2  --   GFRNONAA >60 >60 39*  ANIONGAP 8 10 9     No results for input(s): "PROT", "ALBUMIN", "AST", "ALT", "ALKPHOS", "BILITOT" in the last 168 hours. Lipids No results for input(s): "CHOL", "TRIG", "HDL", "LABVLDL", "LDLCALC", "CHOLHDL" in the last 168 hours.  Hematology Recent Labs  Lab 02/02/23 0425 02/03/23 0519 02/03/23 1747  WBC 11.2* 11.6* 16.0*  RBC 4.60 4.20 4.34  HGB 13.3 12.1 12.5  HCT 40.2 37.0 37.5  MCV 87.4 88.1 86.4  MCH 28.9 28.8 28.8  MCHC 33.1 32.7 33.3  RDW 14.6 15.0 14.7  PLT 204 192 198   Thyroid No results for input(s): "TSH", "FREET4" in the last 168 hours.  BNPNo results for input(s): "BNP", "PROBNP" in the last 168 hours.   DDimer No results for input(s): "DDIMER" in the last 168 hours.   Radiology/Studies:  DG HIP UNILAT WITH PELVIS 2-3 VIEWS LEFT  Result Date: 02/02/2023 CLINICAL DATA:  Postop. EXAM: DG HIP (WITH OR WITHOUT PELVIS) 2-3V LEFT COMPARISON:  Preoperative radiograph yesterday FINDINGS: Four screws traverse left femoral neck fracture. Unchanged fracture alignment from preoperative imaging. Recent postsurgical change includes air and edema in the soft tissues. Lateral skin staples in place. IMPRESSION: ORIF left femoral neck fracture. No immediate postoperative complication. Electronically Signed   By: Narda Rutherford M.D.   On: 02/02/2023 15:52   DG HIP UNILAT WITH PELVIS 2-3 VIEWS LEFT  Result Date: 02/02/2023 CLINICAL DATA:  [id number] Surgery, elective [id number] EXAM: DG HIP (WITH OR WITHOUT PELVIS) 2-3V LEFT COMPARISON:  [id number] FINDINGS: Intraoperative images during pin fixation of the left femoral neck for a subcapital femoral neck fracture. Stable alignment. No evidence of immediate hardware complication. IMPRESSION: Intraoperative images during left femoral neck fixation. No evidence of immediate hardware complication. Electronically Signed   By: Caprice Renshaw M.D.   On: 02/02/2023 15:06   DG C-Arm 1-60 Min-No Report  Result Date: 02/02/2023 Fluoroscopy was utilized by the requesting physician.  No radiographic interpretation.   DG Chest 1 View  Result Date: 02/01/2023 CLINICAL DATA:  Left hip pain after fall.  EXAM: CHEST  1 VIEW COMPARISON:  Chest radiograph dated 06/26/2022. FINDINGS: No focal consolidation, pleural effusion, or pneumothorax. The cardiac silhouette is within normal limits. No acute osseous pathology. Lower cervical ACDF. IMPRESSION: No active disease. Electronically Signed   By: Elgie Collard M.D.   On: 02/01/2023 19:09   DG Hip Unilat W or Wo Pelvis 2-3 Views Left  Result Date: 02/01/2023 CLINICAL DATA:  Fall and left hip pain. EXAM: DG HIP (WITH OR WITHOUT PELVIS) 2-3V LEFT  COMPARISON:  None Available. FINDINGS: Evaluation for fracture is limited due to osteopenia. The wrist angulation of the left femoral neck most consistent with a nondisplaced impacted subcapital fracture. Further evaluation with CT is recommended. No dislocation. The soft tissues are unremarkable. IMPRESSION: Nondisplaced impacted subcapital fracture of the left femur. Further evaluation with CT is recommended. Electronically Signed   By: Elgie Collard M.D.   On: 02/01/2023 19:08   CT Head Wo Contrast  Result Date: 02/01/2023 CLINICAL DATA:  Trauma. EXAM: CT HEAD WITHOUT CONTRAST CT CERVICAL SPINE WITHOUT CONTRAST TECHNIQUE: Multidetector CT imaging of the head and cervical spine was performed following the standard protocol without intravenous contrast. Multiplanar CT image reconstructions of the cervical spine were also generated. RADIATION DOSE REDUCTION: This exam was performed according to the departmental dose-optimization program which includes automated exposure control, adjustment of the mA and/or kV according to patient size and/or use of iterative reconstruction technique. COMPARISON:  None Available. FINDINGS: CT HEAD FINDINGS Brain: The ventricles and sulci are appropriate size for patient's age. Mild periventricular and deep white matter chronic microvascular ischemic changes noted. There is no acute intracranial hemorrhage. No mass effect or midline shift. No extra-axial fluid collection. Vascular: No hyperdense vessel or unexpected calcification. Skull: Normal. Negative for fracture or focal lesion. Sinuses/Orbits: No acute finding. Other: None CT CERVICAL SPINE FINDINGS Alignment: No acute subluxation. There is straightening of normal cervical lordosis which may be positional or due to muscle spasm. Skull base and vertebrae: No acute fracture.  Osteopenia. Soft tissues and spinal canal: No prevertebral fluid or swelling. No visible canal hematoma. Disc levels: No acute findings. Degenerative  changes. C5-C6 bony ankylosis. There is C6 C7 ACDF. Upper chest: Negative. Other: Bilateral carotid bulb calcified plaques. IMPRESSION: 1. No acute intracranial pathology. Mild chronic microvascular ischemic changes. 2. No acute/traumatic cervical spine pathology. Electronically Signed   By: Elgie Collard M.D.   On: 02/01/2023 19:03   CT Cervical Spine Wo Contrast  Result Date: 02/01/2023 CLINICAL DATA:  Trauma. EXAM: CT HEAD WITHOUT CONTRAST CT CERVICAL SPINE WITHOUT CONTRAST TECHNIQUE: Multidetector CT imaging of the head and cervical spine was performed following the standard protocol without intravenous contrast. Multiplanar CT image reconstructions of the cervical spine were also generated. RADIATION DOSE REDUCTION: This exam was performed according to the departmental dose-optimization program which includes automated exposure control, adjustment of the mA and/or kV according to patient size and/or use of iterative reconstruction technique. COMPARISON:  None Available. FINDINGS: CT HEAD FINDINGS Brain: The ventricles and sulci are appropriate size for patient's age. Mild periventricular and deep white matter chronic microvascular ischemic changes noted. There is no acute intracranial hemorrhage. No mass effect or midline shift. No extra-axial fluid collection. Vascular: No hyperdense vessel or unexpected calcification. Skull: Normal. Negative for fracture or focal lesion. Sinuses/Orbits: No acute finding. Other: None CT CERVICAL SPINE FINDINGS Alignment: No acute subluxation. There is straightening of normal cervical lordosis which may be positional or due to muscle spasm. Skull base and vertebrae: No acute  fracture.  Osteopenia. Soft tissues and spinal canal: No prevertebral fluid or swelling. No visible canal hematoma. Disc levels: No acute findings. Degenerative changes. C5-C6 bony ankylosis. There is C6 C7 ACDF. Upper chest: Negative. Other: Bilateral carotid bulb calcified plaques. IMPRESSION: 1. No  acute intracranial pathology. Mild chronic microvascular ischemic changes. 2. No acute/traumatic cervical spine pathology. Electronically Signed   By: Elgie Collard M.D.   On: 02/01/2023 19:03     Assessment and Plan:   Chest pain and abnormal EKG: Patient had sudden onset of chest pain radiating to her back earlier today with EKG changes.  I have personally reviewed her EKG which shows new anterolateral and inferior Q waves.  In reviewing lead placement, it is apparent that precordial leads were placed over the left upper abdomen, well below standard position.  This certainly could be contributing to the low voltage and poor R wave progression seen by EKG.  Her chest pain has improved but not completely resolved.  Given that she does not have an obvious wall motion abnormality on her bedside echocardiogram and is status post recent fall and hip surgery, I think it is important to exclude pulmonary embolism.  I have spoken with Dr. Luberta Robertson and we have agreed to order a CTA of the chest for further evaluation.  In the meantime, heparin infusion should be considered if it is felt safe to do so from an orthopedic standpoint.  High-sensitivity troponin I should be trended.  Repeat EKG with attention to appropriate lead placement is recommended.  I will also order a formal echocardiogram.  If CTA is negative for PE and patient has continued chest pain and/or develops elevated troponin, cardiac catheterization will need to be considered in the next day or two.  Continue judicious use of morphine with potential addition of nitrates and/or metoprolol for angina or leaf and heart rate/blood pressure control.  Hypertension: Blood pressure quite elevated earlier today in the setting of chest pain, improved with morphine.  Consider addition of low-dose beta-blocker and nitrates for antianginal therapy and blood pressure control.  If patient has a significant pulmonary embolism, we will need to be careful that we  do not drop her preload too much.  Left hip fracture: Per orthopedics and internal medicine.    For questions or updates, please contact [facility] Please consult [email or web address] for contact info under [facility].  Signed, Yvonne Kendall, MD  02/03/2023 6:21 PM  Addendum (02/03/23 7:53 PM): I responded to rapid response called immediately after the patient returned from CTA chest due to tachycardia.  She was noted to be tachy up to 170 bpm.  Telemetry shows prior sinus tachycardia with PAC's and brief atrial runs.  More recent tele shows paroxysmal atrial fibrillation and intermittent SVT versus atrial flutter with 2:1 AV block.  EKG shows sinus tachycardia with brief supraventricular run and RBBB.  Prior poor R wave progression is no longer present.  No findings to suggest STEMI.  CTA chest personally reviewed, showing no evidence of acute PE.  There is marked distention of the visualized stomach and esophagus with bilateral dependent lung opacities concerning for aspiration/multifocal pneumonia.  I recommend placement of NG tube to suction and anticoagulation if felt to be safe from a post-op standpoint.  Recommend given metoprolol 2.5 - 5 mg IV every as need for improved heart rate.  Yvonne Kendall, MD [facility]

## 2023-02-03 NOTE — [place] FL2 (Addendum)
[place] MEDICAID FL2 LEVEL OF CARE FORM     IDENTIFICATION  Patient Name: Rachael Austin Birthdate: 1940/12/14 Sex: female Admission Date (Current Location): 02/01/2023  [place] and [place] Number:  Chiropodist and Address:  [facility], [address], [place], [place] [address]      Provider Number: [id number]  Attending Physician Name and Address:  Salomon Mast*  Relative Name and Phone Number:  Coral Else [phone]    Current Level of Care: Hospital Recommended Level of Care: Skilled Nursing Facility Prior Approval Number:    Date Approved/Denied:   PASRR Number: [id number]  Discharge Plan: SNF    Current Diagnoses: Patient Active Problem List   Diagnosis Date Noted   Femoral neck fracture (HCC) 02/02/2023   Closed left hip fracture, initial encounter (HCC) 02/01/2023   Hypertensive urgency 02/01/2023   Prolonged QT interval 02/01/2023   Itching 08/09/2022   Fall 06/28/2022   Thoracic arthritis 06/26/2022   Osteoporosis 06/26/2022   At high risk for falls 06/26/2022   Multiple closed fractures of ribs of left side 06/26/2022   Leg weakness 10/23/2021   SOB (shortness of breath) 10/22/2021   Vaginal irritation 08/13/2021   Constipation 08/13/2021   Dysuria 05/26/2021   Skin lesion 03/23/2021   Breast cancer screening 03/23/2021   Near syncope 01/29/2021   Hyperglycemia 10/18/2020   Left carotid bruit 04/07/2020   Leg lesion 04/07/2020   Leg pain 03/16/2020   Post-nasal drainage 01/29/2020   Chest tightness 10/04/2017   Aortic atherosclerosis (HCC) 07/02/2017   Special screening for malignant neoplasms, colon    Hiatal hernia    H/O: osteoarthritis 02/05/2016   H/O neoplasm 02/19/2015   H/O Malignant melanoma 02/19/2015   Essential hypertension 02/14/2015   Health care maintenance 02/14/2015   Abnormal involuntary movement 07/27/2014   Stress 05/09/2014   Abdominal pain 02/21/2014    Difficulty hearing 08/16/2013   GERD (gastroesophageal reflux disease) 06/17/2013   Acid reflux 06/17/2013   Detrusor dyssynergia 02/04/2013   Tremor    Hypercholesterolemia 08/09/2012   Chronic interstitial cystitis 08/09/2012    Orientation RESPIRATION BLADDER Height & Weight     Self, Time, Situation, Place  Normal Continent Weight: 64.4 kg Height:  5\' 4"  (162.6 cm)  BEHAVIORAL SYMPTOMS/MOOD NEUROLOGICAL BOWEL NUTRITION STATUS      Continent  (See Discharge Summary)  AMBULATORY STATUS COMMUNICATION OF NEEDS Skin   Extensive Assist Verbally Normal                       Personal Care Assistance Level of Assistance  Bathing, Feeding, Dressing Bathing Assistance: Maximum assistance Feeding assistance: Limited assistance Dressing Assistance: Maximum assistance     Functional Limitations Info  Sight, Hearing, Speech Sight Info: Adequate Hearing Info: Adequate Speech Info: Adequate    SPECIAL CARE FACTORS FREQUENCY  PT (By licensed PT), OT (By licensed OT)     PT Frequency: 5x Weekly OT Frequency: 5x Weekly            Contractures Contractures Info: Not present    Additional Factors Info  Code Status, Allergies Code Status Info: Full Code Allergies Info: Augmentin (Amoxicillin-pot Clavulanate), Lexapro (Escitalopram Oxalate), Micardis (Telmisartan), Myrbetriq (Mirabegron), Niacin And Related, Codeine Sulfate, Vesicare (Solifenacin Succinate)           Current Medications (02/03/2023):  This is the current hospital active medication list Current Facility-Administered Medications  Medication Dose Route Frequency Provider Last Rate Last Admin   acetaminophen (  TYLENOL) tablet 325-650 mg  325-650 mg Oral Q6H PRN Juanell Fairly, MD       alum & mag hydroxide-simeth (MAALOX/MYLANTA) 200-200-20 MG/5ML suspension 30 mL  30 mL Oral Q4H PRN Juanell Fairly, MD   30 mL at 02/03/23 0925   bisacodyl (DULCOLAX) suppository 10 mg  10 mg Rectal Daily PRN Juanell Fairly,  MD       docusate sodium (COLACE) capsule 100 mg  100 mg Oral BID Juanell Fairly, MD   100 mg at 02/02/23 2213   enoxaparin (LOVENOX) injection 30 mg  30 mg Subcutaneous Q24H Bari Mantis A, RPH   30 mg at 02/03/23 0859   feeding supplement (ENSURE ENLIVE / ENSURE PLUS) liquid 237 mL  237 mL Oral BID BM Leandro Reasoner Tublu, MD   237 mL at 02/03/23 0859   hydrALAZINE (APRESOLINE) tablet 25 mg  25 mg Oral BID Juanell Fairly, MD   25 mg at 02/03/23 0900   hydrALAZINE (APRESOLINE) tablet 25 mg  25 mg Oral Q6H PRN Juanell Fairly, MD       HYDROcodone-acetaminophen (NORCO/VICODIN) 5-325 MG per tablet 1-2 tablet  1-2 tablet Oral Q4H PRN Juanell Fairly, MD   1 tablet at 02/02/23 2319   menthol-cetylpyridinium (CEPACOL) lozenge 3 mg  1 lozenge Oral PRN Juanell Fairly, MD       Or   phenol (CHLORASEPTIC) mouth spray 1 spray  1 spray Mouth/Throat PRN Juanell Fairly, MD       methocarbamol (ROBAXIN) tablet 500 mg  500 mg Oral Q6H PRN Juanell Fairly, MD       Or   methocarbamol (ROBAXIN) 500 mg in dextrose 5 % 50 mL IVPB  500 mg Intravenous Q6H PRN Juanell Fairly, MD       morphine (PF) 2 MG/ML injection 0.5-1 mg  0.5-1 mg Intravenous Q2H PRN Juanell Fairly, MD       multivitamin with minerals tablet 1 tablet  1 tablet Oral Daily Leandro Reasoner Tublu, MD       pantoprazole (PROTONIX) EC tablet 40 mg  40 mg Oral Daily Juanell Fairly, MD   40 mg at 02/02/23 0820   polyethylene glycol (MIRALAX / GLYCOLAX) packet 17 g  17 g Oral Daily PRN Juanell Fairly, MD   17 g at 02/03/23 0931   pravastatin (PRAVACHOL) tablet 20 mg  20 mg Oral q1800 Juanell Fairly, MD       prochlorperazine (COMPAZINE) injection 5 mg  5 mg Intravenous Q4H PRN Juanell Fairly, MD   5 mg at 02/03/23 2841   senna (SENOKOT) tablet 8.6 mg  1 tablet Oral BID Juanell Fairly, MD   8.6 mg at 02/02/23 2213     Discharge Medications: Please see discharge summary for a list of discharge  medications.  Relevant Imaging Results:  Relevant Lab Results:   Additional Information [id number]  Garret Reddish, RN

## 2023-02-03 NOTE — Significant Event (Signed)
Rapid Response Event Note   Reason for Call :  Chest pain, hypertension  Initial Focused Assessment:  Rapid response arrived in patient's room surrounded by 1A, shortly followed by Dr. Luberta Robertson. Patient alert, sitting up in bed, rubbing at her chest and complaining of 8 out of 10 pressure in chest and 8 out of 10 pain in back. Patient's nurse stated that the patient had described it like an elephant sitting on her chest. BP 188/78 MAP 102 HR 102 ST oxygen saturations 93-95% on 2L nasal cannula.  Interventions:  Patient got one dose of nitro sublingual and morphine per orders. 12 EKG done. CBG 148. Tried patient on room air and patient reported no shortness of breath but saturations were 90% so patient placed back on 2L with return to 93-95%. Patient reported no difference in pain after nitro and BP appeared roughly the same 188/73 (102) HR 113. After morphine patient's BP was 125/54 MAP 64 HR 108. Patient reported nausea post morphine which was treated with her prn compazine. Post compazine, patient appeared sleepy and seemed to be snoring in between nursing contact, easily aroused to voice. She report that her pain felt better, that it still felt like burning and pressure, but that she could not give it a number. Last BP before rapid response RN left was 130/64 MAP 81. Patient placed on cardiac monitoring.  Plan of Care:  Patient to remain on 1A for now. Dr. Luberta Robertson reaching out to cardiology. Labs pending. 1A staff to reach back out to rapid response RN if there are further needs from rapid response.  Event Summary:   MD Notified: Dr. Luberta Robertson Call Time: 17:06 Arrival Time: 17:08 End Time: 17:33  , Theresia Lo, RN

## 2023-02-03 NOTE — Evaluation (Signed)
Occupational Therapy Evaluation Patient Details Name: Rachael Austin MRN: [id number] DOB: [birth year] Today's Date: 02/03/2023   History of Present Illness Pt is an 82 y/o F admitted on 02/01/23 after presenting with c/o a fall. Pt found to have L impacted femoral neck hip fx & underwent percutaneous fixation on 02/02/23. PMH: HTN, interstitial cystitis, DDD, GERD, hypercholesterolemia   Clinical Impression   Ms Scicchitano was seen for OT/PT co-evaluation this date. Prior to hospital admission, pt was IND. Pt lives alone. Pt presents to acute OT demonstrating impaired ADL performance and functional mobility 2/2 decreased activity tolerance and functional strength/ROM/balance deficits. Pt currently requires MAX A for LB access seated EOB. SETUP + SUPERVISION self-drinking seated EOB, sitting tolerance limited by nausea. CGA  lateral scoot bed>chair, cues for technique. Pt would benefit from skilled OT to address noted impairments and functional limitations (see below for any additional details). Upon hospital discharge, recommend follow up therapy <3 hours/day.   Recommendations for follow up therapy are one component of a multi-disciplinary discharge planning process, led by the attending physician.  Recommendations may be updated based on patient status, additional functional criteria and insurance authorization.   Assistance Recommended at Discharge Frequent or constant Supervision/Assistance  Patient can return home with the following A lot of help with walking and/or transfers;A lot of help with bathing/dressing/bathroom;Help with stairs or ramp for entrance    Functional Status Assessment  Patient has had a recent decline in their functional status and demonstrates the ability to make significant improvements in function in a reasonable and predictable amount of time.  Equipment Recommendations  BSC/3in1    Recommendations for Other Services       Precautions / Restrictions  Precautions Precautions: Fall Restrictions Weight Bearing Restrictions: Yes LLE Weight Bearing: Partial weight bearing LLE Partial Weight Bearing Percentage or Pounds: 25%      Mobility Bed Mobility Overal bed mobility: Needs Assistance Bed Mobility: Supine to Sit     Supine to sit: Mod assist, HOB elevated          Transfers Overall transfer level: Needs assistance Equipment used: None Transfers: Bed to chair/wheelchair/BSC            Lateral/Scoot Transfers: Min guard General transfer comment: defers standing attempts citing nausea      Balance Overall balance assessment: Needs assistance Sitting-balance support: Feet supported, Bilateral upper extremity supported Sitting balance-Leahy Scale: Fair                                     ADL either performed or assessed with clinical judgement   ADL Overall ADL's : Needs assistance/impaired                                       General ADL Comments: MAX A for LB access seated EOB. SETUP + SUPERVISION self-drinking seated EOB, sitting tolerance limited by nausea. CGA  lateral scoot bed>chair, cues for technique.      Pertinent Vitals/Pain Pain Assessment Pain Assessment: Faces Faces Pain Scale: Hurts a little bit Pain Location: L LE Pain Descriptors / Indicators: Discomfort Pain Intervention(s): Limited activity within patient's tolerance, Repositioned     Hand Dominance     Extremity/Trunk Assessment Upper Extremity Assessment Upper Extremity Assessment: Generalized weakness   Lower Extremity Assessment Lower Extremity Assessment: Generalized weakness RLE Deficits / Details:  3-/5 knee extension in sitting LLE Deficits / Details: 2/5 knee extension in sitting       Communication Communication Communication: No difficulties   Cognition Arousal/Alertness: Awake/alert Behavior During Therapy: WFL for tasks assessed/performed Overall Cognitive Status: Within  Functional Limits for tasks assessed                                       General Comments  SpO2 94% on 2L [place]            Home Living Family/patient expects to be discharged to:: Private residence Living Arrangements: Alone Available Help at Discharge: Family;Friend(s);Available PRN/intermittently Type of Home: House Home Access: Stairs to enter Entrance Stairs-Number of Steps: 1 Entrance Stairs-Rails: None Home Layout: One level     Bathroom Shower/Tub: Producer, television/film/video: Standard     Home Equipment: Tub bench          Prior Functioning/Environment Prior Level of Function : Independent/Modified Independent;Driving             Mobility Comments: Independent without AD unless walking around her "land" when she used a walking stick.          OT Problem List: Decreased strength;Decreased range of motion;Decreased activity tolerance;Impaired balance (sitting and/or standing);Decreased safety awareness      OT Treatment/Interventions: Self-care/ADL training;Therapeutic exercise;Energy conservation;DME and/or AE instruction;Therapeutic activities;Patient/family education;Balance training    OT Goals(Current goals can be found in the care plan section) Acute Rehab OT Goals Patient Stated Goal: to go home OT Goal Formulation: With patient/family Time For Goal Achievement: 02/17/23 Potential to Achieve Goals: Good ADL Goals Pt Will Perform Grooming: with modified independence;sitting (tolerate >10 mins) Pt Will Perform Lower Body Dressing: with supervision;with caregiver independent in assisting;sitting/lateral leans Pt Will Transfer to Toilet: with supervision;stand pivot transfer;bedside commode  OT Frequency: Min 2X/week    Co-evaluation PT/OT/SLP Co-Evaluation/Treatment: Yes Reason for Co-Treatment: To address functional/ADL transfers PT goals addressed during session: Mobility/safety with mobility;Balance OT goals addressed  during session: ADL's and self-care      AM-PAC OT "6 Clicks" Daily Activity     Outcome Measure Help from another person eating meals?: None Help from another person taking care of personal grooming?: A Little Help from another person toileting, which includes using toliet, bedpan, or urinal?: A Lot Help from another person bathing (including washing, rinsing, drying)?: A Lot Help from another person to put on and taking off regular upper body clothing?: A Little Help from another person to put on and taking off regular lower body clothing?: A Lot 6 Click Score: 16   End of Session Nurse Communication: Mobility status  Activity Tolerance: Patient tolerated treatment well Patient left: in chair;with call bell/phone within reach;with chair alarm set;with family/visitor present  OT Visit Diagnosis: Other abnormalities of gait and mobility (R26.89);Muscle weakness (generalized) (M62.81)                Time: 0865-7846 OT Time Calculation (min): 22 min Charges:  OT General Charges $OT Visit: 1 Visit OT Evaluation $OT Eval Moderate Complexity: 1 Mod  Kathie Dike, M.S. OTR/L  02/03/23, 1:44 PM  ascom [phone]

## 2023-02-03 NOTE — Progress Notes (Addendum)
Pt HR stays at 110-114 then jump up to 130-182. NP Foust made aware.  Update 2113: NP Foust ordered to placed NG tube on pt. Will continue to monitor.   Update 2144: NP Foust placed order. Will continue to monitor.  Update 2253: Per report pt have not voided since foley removed this morning. Pt was adminstered sodium chloride 1000 ml intravenous once but not voided yetr. Bladder scanned showed 237 ml. NP Foust made aware. Will continue to monitor.  Update 2300L NP Foust placed order for in and out cath once. Will continue to monitor.

## 2023-02-03 NOTE — Consult Note (Signed)
ANTICOAGULATION CONSULT NOTE - Initial Consult  Pharmacy Consult for heparin infusion Indication: chest pain/ACS  Allergies  Allergen Reactions   Augmentin [Amoxicillin-Pot Clavulanate] Swelling and Rash    Swelling of the lips   Lexapro [Escitalopram Oxalate]    Micardis [Telmisartan] Itching   Myrbetriq [Mirabegron] Itching   Niacin And Related Itching   Codeine Sulfate Other (See Comments)    "Makes her hyper"   Vesicare [Solifenacin Succinate] Nausea And Vomiting and Rash    Patient Measurements: Height: 5\' 4"  (162.6 cm) Weight: 64.4 kg (142 lb) IBW/kg (Calculated) : 54.7 Heparin Dosing Weight: 64.4 kg   Vital Signs: Temp: 98.1 F (36.7 C) (05/07 1630) BP: 130/64 (05/07 1731) Pulse Rate: 100 (05/07 1630)  Labs: Recent Labs    02/02/23 0425 02/03/23 0519 02/03/23 1747  HGB 13.3 12.1 12.5  HCT 40.2 37.0 37.5  PLT 204 192 198  CREATININE 0.72 1.35* 1.11*  TROPONINIHS  --   --  15    Estimated Creatinine Clearance: 33.2 mL/min (A) (by C-G formula based on SCr of 1.11 mg/dL (H)).   Medical History: Past Medical History:  Diagnosis Date   AK (actinic keratosis) 11/12/2022   left upper arm, tx'd with EDC   AK (actinic keratosis) 11/12/2022   right medial pretibia, LN2 11/25/22   Basal cell carcinoma 06/21/2020   left post shoulder sup, left mid chest, left lat thigh   Basal cell carcinoma 11/07/2021   L upper back, EDC   Basal cell carcinoma 11/07/2021   R upper back, EDC   Basal cell carcinoma 11/27/2021   right upper arm, EDC   Basal cell carcinoma 11/27/2021   right shoulder posterior, EDC   Basal cell carcinoma 11/27/2021   right anterior shoulder, EDC   Basal cell carcinoma (BCC) 06/13/2021   left dorsal foot, EDC 10/02/2021   Basal cell carcinoma (BCC) 06/13/2021   right postauricular tx'd w/ EDC   BCC (basal cell carcinoma of skin) 03/30/2007   R inf med pretibial - BCC   BCC (basal cell carcinoma of skin) 10/12/2013   L nasal ala - BCC   BCC  (basal cell carcinoma of skin) 03/10/2018   L mid dorsum med forearm - superficial BCC    BCC (basal cell carcinoma) 10/02/2021   left dorsal foot proximal, EDC 10/02/2021   BCC (basal cell carcinoma) 10/02/2021   left mid back, superficial EDC 11/07/21   BCC (basal cell carcinoma) 10/02/2021   right pretibia   BCC (basal cell carcinoma) 10/02/2021   right mid back, superficial EDC 11/07/21   BCC (basal cell carcinoma) 08/14/2022   left distal calf, tx'd with EDC   BCC (basal cell carcinoma) 11/12/2022   superficial at left lateral pretibial,l tx'd with EDC   BCC (basal cell carcinoma) 11/12/2022   superficial at mid back right of midline, scheduled for EDC   BCC (basal cell carcinoma) 11/12/2022   mid back right of midline, [facility] 11/25/22   Breast screening, unspecified 2013   Cancer (HCC) 1992   skin   Degenerative disk disease    GERD (gastroesophageal reflux disease)    History of basal cell carcinoma (BCC) 08/29/2020    left inferior knee lateral, , left inferior knee medial, right pretibia   History of SCC (squamous cell carcinoma) of skin 08/29/2020   right posterior calf, left lateral calf, and    Hypercholesterolemia 2008   Interstitial cystitis    followed by Dr Achilles Dunk   Other sign and symptom in breast 2013  Left upper outer quadrant breast "soreness" Ultrasound exam of right breast in the 2 o'clock position with the breast distracted medially showed a 0.3-0.4 with 0.5 cm simple cyst. In the 1 o'clock position where pt reported tenderness US exam was negatiive.  Because of her history of intermittent nipple drainage, ultrasound was completed of the retroareolar area.   Personal history of tobacco use, presenting hazards to health    PONV (postoperative nausea and vomiting)    SCC (squamous cell carcinoma) 03/28/2008   R dorsum hand - SCC   SCC (squamous cell carcinoma) 05/09/2009   R lat lower leg - SCCIS   SCC (squamous cell carcinoma) 05/09/2009   L lat lower leg - SCCIS    SCC (squamous cell carcinoma) 04/07/2013   L lower leg - SCCIS   SCC (squamous cell carcinoma) 04/27/2013   R forearm - SCC   SCC (squamous cell carcinoma) 04/28/2017   L lat knee - SCCIS   SCC (squamous cell carcinoma) 05/12/2018   L prox dorsum forearm - SCC   SCC (squamous cell carcinoma) 06/13/2021   left forearm tx'd w/ EDC   SCC (squamous cell carcinoma), leg, right 03/30/2007   R sup pretibial - SCCIS   SCC (squamous cell carcinoma);BCC 10/12/2013   Mid back - superficial BCC with SCCIS    Special screening for malignant neoplasms, colon    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   left dorsal forearm, left lat calf   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 Last dose 5/7 @ 0900  Assessment: 82 y.o. female with a hx of hypertension, hyperlipidemia, aortic atherosclerosis, syncope, and recurrent falls, admitted 02/01/23 with left hip fracture 2/2 mechanical fall. Post-Op Day 1 Left femoral neck fracture fixation on 02/02/23. On 02/03/23 patient with new onset  chest pain and abnormal EKG. Pharmacy has been consulted to initiate and manage IV heparin therapy.  Initial troponin 15.  Goal of Therapy:  Heparin level 0.3-0.7 units/ml Monitor platelets by anticoagulation protocol: Yes   Plan:  Give 3000 units bolus x 1 Start heparin infusion at 750 units/hr Check anti-Xa level in 8 hours and daily while on heparin Continue to monitor H&H and platelets  Sharen Hones, PharmD, BCPS Clinical Pharmacist   02/03/2023,7:47 PM

## 2023-02-03 NOTE — Progress Notes (Addendum)
PROGRESS NOTE    Rachael Austin  NWG:[id number]  DOB: 10-21-1940  DOA: 02/01/2023 PCP: Dale Albion, MD Outpatient Specialists:   Hospital course:  82 year old female with HTN and interstitial cystitis was admitted with left hip fracture after a mechanical fall.   Subjective:  Called to see patient for complaints of "elephant sitting on my chest, heavy pressure".  Patient has been having intermittent chest pain on and off over the course of the day but she thought this was her typical pain from acid reflux.  However later that afternoon patient complained to the nurse of chest heaviness unlike anything she had ever had before.   Objective: Vitals:   02/02/23 2312 02/03/23 0423 02/03/23 0952 02/03/23 1630  BP: 135/67 (!) 124/56 (!) 156/58 (!) 203/80  Pulse: (!) 102 (!) 102 (!) 103 100  Resp: 18 18 17 19   Temp: 97.9 F (36.6 C) 99.1 F (37.3 C) 98.5 F (36.9 C) 98.1 F (36.7 C)  TempSrc: Oral Oral    SpO2: 97% 93% 97% 96%  Weight:      Height:       No intake or output data in the 24 hours ending 02/03/23 1644  Filed Weights   02/01/23 1756  Weight: 64.4 kg     Exam:  General: Somewhat anxious appearing patient lying in bed gesturing to mid chest complaining of pressure. Eyes: sclera anicteric, conjuctiva mild injection bilaterally CVS: S1-S2, regular, no new murmurs noted. Respiratory: Rales at bases bilaterally GI: NABS, soft, NT  LE: Ecchymoses noted, no edema  Data Reviewed:  Basic Metabolic Panel: Recent Labs  Lab 02/01/23 1813 02/02/23 0425 02/03/23 0519  NA 138 138 134*  K 3.7 3.7 4.0  CL 104 106 104  CO2 26 22 21*  GLUCOSE 109* 175* 147*  BUN 11 9 20   CREATININE 0.88 0.72 1.35*  CALCIUM 9.0 8.5* 8.5*  MG  --  2.2  --      CBC: Recent Labs  Lab 02/01/23 1813 02/02/23 0425 02/03/23 0519  WBC 7.2 11.2* 11.6*  NEUTROABS 4.6  --   --   HGB 12.5 13.3 12.1  HCT 38.4 40.2 37.0  MCV 88.9 87.4 88.1  PLT 226 204 192       Scheduled Meds:  docusate sodium  100 mg Oral BID   enoxaparin (LOVENOX) injection  30 mg Subcutaneous Q24H   feeding supplement  237 mL Oral BID BM   hydrALAZINE  25 mg Oral BID   multivitamin with minerals  1 tablet Oral Daily   pantoprazole  40 mg Oral Daily   pravastatin  20 mg Oral q1800   senna  1 tablet Oral BID   Continuous Infusions:  sodium chloride     methocarbamol (ROBAXIN) IV       Assessment & Plan:   Substernal chest pressure Hypertensive urgency Patient had sudden increase in hypertension Of note patient had been in hypertensive urgency when she was admitted thought to be secondary to pain as BP improved with management of pain. RN notes that patient has been refusing pain medication/morphine all day due to concern for constipation. EKG done with significant changes including changes in R wave progression and axis, leads checked and were found to be appropriate per RN. Patient treated with NTG SL x 1 without improvement and then given morphine 1 mg IV with marked improvement in blood pressure and some decrease in SSEP, down to 5/10 from 8/10. Patient denied new shortness of breath. EKG reviewed with  Dr. Okey Dupre who performed bedside echo with no new segmental wall motion abnormalities with reasonably good cardiac function. Plan is for emergent CTA now to rule out pulmonary embolus. Labs including troponin have been ordered and are pending Will transfer patient to progressive unit now. Anticoagulation can be initiated if troponins are elevated or CTA positive for PE As needed labetalol for hypertension urgency ordered. Patient signed out to overnight team to keep a close eye on her.  AKI Patient will need hydration once acute issues of chest pain have been resolved and we are sure she is not in danger of pulmonary edema/decompensated heart failure. Bladder scan with 200 cc  GERD Patient has been having reflux type pain over the course of the day Change  p.o. pantoprazole to pantoprazole IV 40 twice daily  Left hip fracture  Percutaneous fixation of left femoral neck POD #1 PT, OT, pain management and DVT prophylaxis per orthopedics   DVT prophylaxis: SCD Per orthopedics Code Status: Full Family Communication: Patient's daughter was at bedside throughout      Studies: DG HIP UNILAT WITH PELVIS 2-3 VIEWS LEFT  Result Date: 02/02/2023 CLINICAL DATA:  Postop. EXAM: DG HIP (WITH OR WITHOUT PELVIS) 2-3V LEFT COMPARISON:  Preoperative radiograph yesterday FINDINGS: Four screws traverse left femoral neck fracture. Unchanged fracture alignment from preoperative imaging. Recent postsurgical change includes air and edema in the soft tissues. Lateral skin staples in place. IMPRESSION: ORIF left femoral neck fracture. No immediate postoperative complication. Electronically Signed   By: Narda Rutherford M.D.   On: 02/02/2023 15:52   DG HIP UNILAT WITH PELVIS 2-3 VIEWS LEFT  Result Date: 02/02/2023 CLINICAL DATA:  [id number] Surgery, elective [id number] EXAM: DG HIP (WITH OR WITHOUT PELVIS) 2-3V LEFT COMPARISON:  [id number] FINDINGS: Intraoperative images during pin fixation of the left femoral neck for a subcapital femoral neck fracture. Stable alignment. No evidence of immediate hardware complication. IMPRESSION: Intraoperative images during left femoral neck fixation. No evidence of immediate hardware complication. Electronically Signed   By: Caprice Renshaw M.D.   On: 02/02/2023 15:06   DG C-Arm 1-60 Min-No Report  Result Date: 02/02/2023 Fluoroscopy was utilized by the requesting physician.  No radiographic interpretation.   DG Chest 1 View  Result Date: 02/01/2023 CLINICAL DATA:  Left hip pain after fall. EXAM: CHEST  1 VIEW COMPARISON:  Chest radiograph dated 06/26/2022. FINDINGS: No focal consolidation, pleural effusion, or pneumothorax. The cardiac silhouette is within normal limits. No acute osseous pathology. Lower cervical ACDF. IMPRESSION: No active  disease. Electronically Signed   By: Elgie Collard M.D.   On: 02/01/2023 19:09   DG Hip Unilat W or Wo Pelvis 2-3 Views Left  Result Date: 02/01/2023 CLINICAL DATA:  Fall and left hip pain. EXAM: DG HIP (WITH OR WITHOUT PELVIS) 2-3V LEFT COMPARISON:  None Available. FINDINGS: Evaluation for fracture is limited due to osteopenia. The wrist angulation of the left femoral neck most consistent with a nondisplaced impacted subcapital fracture. Further evaluation with CT is recommended. No dislocation. The soft tissues are unremarkable. IMPRESSION: Nondisplaced impacted subcapital fracture of the left femur. Further evaluation with CT is recommended. Electronically Signed   By: Elgie Collard M.D.   On: 02/01/2023 19:08   CT Head Wo Contrast  Result Date: 02/01/2023 CLINICAL DATA:  Trauma. EXAM: CT HEAD WITHOUT CONTRAST CT CERVICAL SPINE WITHOUT CONTRAST TECHNIQUE: Multidetector CT imaging of the head and cervical spine was performed following the standard protocol without intravenous contrast. Multiplanar CT image reconstructions of the cervical spine  were also generated. RADIATION DOSE REDUCTION: This exam was performed according to the departmental dose-optimization program which includes automated exposure control, adjustment of the mA and/or kV according to patient size and/or use of iterative reconstruction technique. COMPARISON:  None Available. FINDINGS: CT HEAD FINDINGS Brain: The ventricles and sulci are appropriate size for patient's age. Mild periventricular and deep white matter chronic microvascular ischemic changes noted. There is no acute intracranial hemorrhage. No mass effect or midline shift. No extra-axial fluid collection. Vascular: No hyperdense vessel or unexpected calcification. Skull: Normal. Negative for fracture or focal lesion. Sinuses/Orbits: No acute finding. Other: None CT CERVICAL SPINE FINDINGS Alignment: No acute subluxation. There is straightening of normal cervical lordosis  which may be positional or due to muscle spasm. Skull base and vertebrae: No acute fracture.  Osteopenia. Soft tissues and spinal canal: No prevertebral fluid or swelling. No visible canal hematoma. Disc levels: No acute findings. Degenerative changes. C5-C6 bony ankylosis. There is C6 C7 ACDF. Upper chest: Negative. Other: Bilateral carotid bulb calcified plaques. IMPRESSION: 1. No acute intracranial pathology. Mild chronic microvascular ischemic changes. 2. No acute/traumatic cervical spine pathology. Electronically Signed   By: Elgie Collard M.D.   On: 02/01/2023 19:03   CT Cervical Spine Wo Contrast  Result Date: 02/01/2023 CLINICAL DATA:  Trauma. EXAM: CT HEAD WITHOUT CONTRAST CT CERVICAL SPINE WITHOUT CONTRAST TECHNIQUE: Multidetector CT imaging of the head and cervical spine was performed following the standard protocol without intravenous contrast. Multiplanar CT image reconstructions of the cervical spine were also generated. RADIATION DOSE REDUCTION: This exam was performed according to the departmental dose-optimization program which includes automated exposure control, adjustment of the mA and/or kV according to patient size and/or use of iterative reconstruction technique. COMPARISON:  None Available. FINDINGS: CT HEAD FINDINGS Brain: The ventricles and sulci are appropriate size for patient's age. Mild periventricular and deep white matter chronic microvascular ischemic changes noted. There is no acute intracranial hemorrhage. No mass effect or midline shift. No extra-axial fluid collection. Vascular: No hyperdense vessel or unexpected calcification. Skull: Normal. Negative for fracture or focal lesion. Sinuses/Orbits: No acute finding. Other: None CT CERVICAL SPINE FINDINGS Alignment: No acute subluxation. There is straightening of normal cervical lordosis which may be positional or due to muscle spasm. Skull base and vertebrae: No acute fracture.  Osteopenia. Soft tissues and spinal canal: No  prevertebral fluid or swelling. No visible canal hematoma. Disc levels: No acute findings. Degenerative changes. C5-C6 bony ankylosis. There is C6 C7 ACDF. Upper chest: Negative. Other: Bilateral carotid bulb calcified plaques. IMPRESSION: 1. No acute intracranial pathology. Mild chronic microvascular ischemic changes. 2. No acute/traumatic cervical spine pathology. Electronically Signed   By: Elgie Collard M.D.   On: 02/01/2023 19:03    Principal Problem:   Closed left hip fracture, initial encounter [facility]) Active Problems:   Hypercholesterolemia   Hypertensive urgency   Prolonged QT interval      Orma Flaming, [facility]  If 7PM-7AM, please contact night-coverage [email or web address]   LOS: 2 days

## 2023-02-03 NOTE — TOC Initial Note (Signed)
Transition of Care [facility]) - Initial/Assessment Note    Patient Details  Name: Rachael Austin MRN: [id number] Date of Birth: Mar 25, 1941  Transition of Care [facility]) CM/SW Contact:    Rachael Reddish, RN Phone Number: 02/03/2023, 3:18 PM  Clinical Narrative:  Chart reviewed.  I have meet with patient and her daughter Rachael Austin at bedside today.  Patient is not feeling well and has requested I refer questions to Rachael Austin reports that prior to admission Rachael Austin was able to bath and dress herself.  Rachael Austin reports that patient was driving.  Patient has no DME at home.  Patient uses [facility] in [place] for prescription medications.  Medications are affordable.  Patient's PCP is Rachael Austin.    Rachael Austin reports that Rachael Austin lives by herself and no one is able to stay with her 24 hours at this time. Rachael Austin reports that she and her mother has spoken about STR and would like to go to STR on discharge.  I have explained the SNF placement process.  Rachael Austin has given me permission to complete a bed search to [place] area facilities.    I will completed FL 2, PASSR and Bed search.    TOC will continue to follow progress of patient.                    Expected Discharge Plan: Skilled Nursing Facility Barriers to Discharge: No Barriers Identified   Patient Goals and CMS Choice            Expected Discharge Plan and Services   Discharge Planning Services: CM Consult   Living arrangements for the past 2 months: Single Family Home                                      Prior Living Arrangements/Services Living arrangements for the past 2 months: Single Family Home Lives with:: Self Patient language and need for interpreter reviewed:: Yes Do you feel safe going back to the place where you live?: Yes      Need for Family Participation in Patient Care: Yes (Comment) (Patient has supportive daughters) Care giver support system in place?: Yes (comment) (Patient has supportive daughters)    Criminal Activity/Legal Involvement Pertinent to Current Situation/Hospitalization: Yes - Comment as needed  Activities of Daily Living      Permission Sought/Granted   Permission granted to share information with : Yes, Verbal Permission Granted              Emotional Assessment Appearance:: Appears stated age Attitude/Demeanor/Rapport: Engaged Affect (typically observed): Appropriate Orientation: : Oriented to Self, Oriented to Place, Oriented to  Time, Oriented to Situation      Admission diagnosis:  Closed left hip fracture, initial encounter (HCC) [S72.002A] Subcapital fracture of hip, left, closed, initial encounter (HCC) [S72.012A] Patient Active Problem List   Diagnosis Date Noted   Femoral neck fracture (HCC) 02/02/2023   Closed left hip fracture, initial encounter (HCC) 02/01/2023   Hypertensive urgency 02/01/2023   Prolonged QT interval 02/01/2023   Itching 08/09/2022   Fall 06/28/2022   Thoracic arthritis 06/26/2022   Osteoporosis 06/26/2022   At high risk for falls 06/26/2022   Multiple closed fractures of ribs of left side 06/26/2022   Leg weakness 10/23/2021   SOB (shortness of breath) 10/22/2021   Vaginal irritation 08/13/2021   Constipation 08/13/2021   Dysuria 05/26/2021   Skin lesion 03/23/2021  Breast cancer screening 03/23/2021   Near syncope 01/29/2021   Hyperglycemia 10/18/2020   Left carotid bruit 04/07/2020   Leg lesion 04/07/2020   Leg pain 03/16/2020   Post-nasal drainage 01/29/2020   Chest tightness 10/04/2017   Aortic atherosclerosis (HCC) 07/02/2017   Special screening for malignant neoplasms, colon    Hiatal hernia    H/O: osteoarthritis 02/05/2016   H/O neoplasm 02/19/2015   H/O Malignant melanoma 02/19/2015   Essential hypertension 02/14/2015   Health care maintenance 02/14/2015   Abnormal involuntary movement 07/27/2014   Stress 05/09/2014   Abdominal pain 02/21/2014   Difficulty hearing 08/16/2013   GERD  (gastroesophageal reflux disease) 06/17/2013   Acid reflux 06/17/2013   Detrusor dyssynergia 02/04/2013   Tremor    Hypercholesterolemia 08/09/2012   Chronic interstitial cystitis 08/09/2012   PCP:  Rachael Austin Pharmacy:   [facility] [address], [place] - [address] [place] [address] Phone: [phone] Fax: [phone]     Social Determinants of Health (SDOH) Social History: SDOH Screenings   Depression (PHQ2-9): Low Risk  (06/26/2022)  Tobacco Use: Medium Risk (02/03/2023)   SDOH Interventions:     Readmission Risk Interventions     No data to display

## 2023-02-03 NOTE — Significant Event (Signed)
Rapid Response Event Note   Reason for Call :  tachycardia  Initial Focused Assessment:  Rapid response RN arrived in patient's room with patient lying in bed surrounded by family and 1A staff. Patient tachycardiac with heart rates in 160-170s, BP 163/82 MAP 102, oxygen saturations 97% on 2L nasal cannula. Patient appears resting with eyes closed but responds easily to voice.   Interventions:  12 lead EKG obtained and interpreted by Dr. Okey Dupre and Dr. Luberta Robertson at beside. 2.5 of IV metoprolol administered per order and patient's heart rate improved to 140s. BP  132/73 MAP 86 at 19:31. New 12 lead EKG obtained post metoprolol administration.  Plan of Care:  Patient to transfer to cardiac progressive care unit. Dr. Luberta Robertson updated Bishop Limbo NP at bedside about patient's plan of care Rapid response RN stayed with patient while transfer arranged by 1A and 2A staff. Patient's heart rate fluctuated between 108 in an apparent SR and 140s in an irregular rhythm. BP at 19:40 152/75 MAP 96 heart rate 125 irregular. 19:50 HR 115 irregular BP 120/72. Patient transported to 2A room 253.  Event Summary:   MD Notified: Dr. Luberta Robertson, Dr. Okey Dupre, and Bishop Limbo NP Call Time: 19:13 Arrival Time: 19:14 End Time: 20:14  Bennie Dallas, RN

## 2023-02-03 NOTE — Progress Notes (Signed)
Name: Rachael Austin MRN: [id number] DOB: [birth year] ATTENDING PHYSICIAN: Leandro Reasoner Tubl*     Subjective: ***   Objective: ***  HPI ***  Today's Vitals   02/03/23 1715 02/03/23 1725 02/03/23 1731 02/03/23 1741  BP: (!) 188/73 (!) 125/53 130/64   Pulse:      Resp:      Temp:      TempSrc:      SpO2:      Weight:      Height:      PainSc:    Asleep   Body mass index is 24.37 kg/m.   CBC    Component Value Date/Time   WBC 16.0 (H) 02/03/2023 1747   RBC 4.34 02/03/2023 1747   HGB 12.5 02/03/2023 1747   HCT 37.5 02/03/2023 1747   PLT 198 02/03/2023 1747   MCV 86.4 02/03/2023 1747   MCH 28.8 02/03/2023 1747   MCHC 33.3 02/03/2023 1747   RDW 14.7 02/03/2023 1747   LYMPHSABS 1.8 02/01/2023 1813   MONOABS 0.6 02/01/2023 1813   EOSABS 0.1 02/01/2023 1813   BASOSABS 0.0 02/01/2023 1813       Latest Ref Rng & Units 02/03/2023    5:47 PM 02/03/2023    5:19 AM 02/02/2023    4:25 AM  BMP  Glucose 70 - 99 mg/dL 409  811  914   BUN 8 - 23 mg/dL 25  20  9    Creatinine 0.44 - 1.00 mg/dL 7.82  9.56  2.13   Sodium 135 - 145 mmol/L 131  134  138   Potassium 3.5 - 5.1 mmol/L 3.8  4.0  3.7   Chloride 98 - 111 mmol/L 97  104  106   CO2 22 - 32 mmol/L 18  21  22    Calcium 8.9 - 10.3 mg/dL 8.6  8.5  8.5     CT Angio Chest Pulmonary Embolism (PE) W or WO Contrast  Result Date: 02/03/2023 CLINICAL DATA:  High probability of pulmonary embolus. EXAM: CT ANGIOGRAPHY CHEST WITH CONTRAST TECHNIQUE: Multidetector CT imaging of the chest was performed using the standard protocol during bolus administration of intravenous contrast. Multiplanar CT image reconstructions and MIPs were obtained to evaluate the vascular anatomy. RADIATION DOSE REDUCTION: This exam was performed according to the departmental dose-optimization program which includes automated exposure control, adjustment of the mA and/or kV according to patient size and/or use of iterative reconstruction technique.  CONTRAST:  60mL OMNIPAQUE IOHEXOL 350 MG/ML SOLN COMPARISON:  None Available. FINDINGS: Cardiovascular: Satisfactory opacification of the pulmonary arteries to the segmental level. No evidence of pulmonary embolism. Normal heart size. No pericardial effusion. Coronary artery calcifications are noted. Mediastinum/Nodes: Thyroid gland is unremarkable. No adenopathy is noted. Severely dilated and fluid-filled esophagus is noted. Lungs/Pleura: No pneumothorax or pleural effusion is noted. Patchy airspace opacities are noted throughout the upper and lower lobes bilaterally most consistent with multifocal pneumonia. Upper Abdomen: Moderate to severe gastric distention is noted. Musculoskeletal: No chest wall abnormality. No acute or significant osseous findings. Review of the MIP images confirms the above findings. IMPRESSION: No definite evidence of pulmonary embolus. Patchy airspace opacities are noted throughout both lungs most consistent with multifocal pneumonia. Severely dilated and fluid-filled esophagus is noted as well as moderate to severe gastric distention seen in visualized portion of upper abdomen. This is concerning for gastric outlet obstruction. Coronary artery calcifications are noted suggesting coronary artery disease. Aortic Atherosclerosis (ICD10-I70.0). Electronically Signed   By: Zenda Alpers.D.  On: 02/03/2023 19:03    Physical Exam:   General: Adult ***, critically***acutely ill, lying in bed intubated & sedated requiring mechanical ventilation *** NAD HEENT: MM pink/moist, anicteric***, atraumatic, neck supple Neuro: A&O x *** commands, PERRL *** , MAE CV: s1s2 ***RRR, *** on monitor, no r/m/g Pulm: Regular, non labored on *** , breath sounds ***-BUL & ***-BLL GI: soft, ***, non***tender, bs x 4 GU: foley in place *** with clear yellow urine Skin: *** no rashes/lesions noted Extremities: warm/dry, pulses + 2 R/P, *** edema noted  Assessment/Plan:   #Atrial  Fibrillation/Atrial Flutter with Rapid Ventricular Response vs SVT - IV Metoprolol   #Gastric Outlet Obstruction - NGT to LIWS  #Aspiration Pneumonia - Rocephin (discussed with phamacy) - 1L bolus   Interventions above per Dr Luberta Robertson who responded to Rapid Response.  Update: HR remains elevated 120s-140s after IV Metoprolol x2. Will start Diltiazem drip.

## 2023-02-03 NOTE — Evaluation (Signed)
Physical Therapy Evaluation Patient Details Name: Rachael Austin MRN: [id number] DOB: 06-17-1941 Today's Date: 02/03/2023  History of Present Illness  Pt is an 82 y/o F admitted on 02/01/23 after presenting with c/o a fall. Pt found to have L impacted femoral neck hip fx & underwent percutaneous fixation on 02/02/23. PMH: HTN, interstitial cystitis, DDD, GERD, hypercholesterolemia  Clinical Impression  Pt seen for PT evaluation with co-tx with OT, pt agreeable. Prior to admission pt was independent without AD, driving. On this date, pt is limited by nausea, dizziness & feeling unwell. Pt requires mod assist for bed mobility with hospital bed features but is able to complete lateral scoot bed>drop arm recliner on R with CGA with cuing for technique & sequencing & extra time PRN. STS attempts deferred 2/2 pt not feeling well today. Pt able to be weaned from 3L/min to 2L/min via nasal cannula. (Vitals: SPO2 >90%, sitting EOB BP In RUE 163/65 mmHg MAP 94, HR 109 bpm). Recommend ongoing PT services to address balance, endurance, strengthening to increase independence with bed mobility, transfers, and gait training.       Recommendations for follow up therapy are one component of a multi-disciplinary discharge planning process, led by the attending physician.  Recommendations may be updated based on patient status, additional functional criteria and insurance authorization.  Follow Up Recommendations Can patient physically be transported by private vehicle: No     Assistance Recommended at Discharge Frequent or constant Supervision/Assistance  Patient can return home with the following  A lot of help with walking and/or transfers;A lot of help with bathing/dressing/bathroom;Assist for transportation;Assistance with cooking/housework;Direct supervision/assist for financial management;Help with stairs or ramp for entrance;Direct supervision/assist for medications management    Equipment Recommendations  None recommended by PT (TBD in next venue)  Recommendations for Other Services       Functional Status Assessment Patient has had a recent decline in their functional status and demonstrates the ability to make significant improvements in function in a reasonable and predictable amount of time.     Precautions / Restrictions Precautions Precautions: Fall Restrictions Weight Bearing Restrictions: Yes LLE Weight Bearing: Partial weight bearing LLE Partial Weight Bearing Percentage or Pounds: 25%      Mobility  Bed Mobility Overal bed mobility: Needs Assistance Bed Mobility: Supine to Sit     Supine to sit: Mod assist, HOB elevated     General bed mobility comments: extra time, cuing for technique, use of bed rails, HOB elevated    Transfers Overall transfer level: Needs assistance Equipment used: None Transfers: Bed to chair/wheelchair/BSC            Lateral/Scoot Transfers: Min guard General transfer comment: Education/cuing re: head/hips relationship, squencing, pt requiring rest breaks PRN.    Ambulation/Gait                  Stairs            Wheelchair Mobility    Modified Rankin (Stroke Patients Only)       Balance Overall balance assessment: Needs assistance Sitting-balance support: Feet supported, Bilateral upper extremity supported Sitting balance-Leahy Scale: Fair                                       Pertinent Vitals/Pain Pain Assessment Pain Assessment: Faces Faces Pain Scale: Hurts a little bit Pain Location: L LE Pain Descriptors / Indicators: Discomfort Pain Intervention(s): Monitored  during session, Limited activity within patient's tolerance, Repositioned    Home Living Family/patient expects to be discharged to:: Private residence Living Arrangements: Alone Available Help at Discharge: Family;Friend(s);Available PRN/intermittently Type of Home: House Home Access: Stairs to enter Entrance  Stairs-Rails: None Entrance Stairs-Number of Steps: 1   Home Layout: One level Home Equipment: Tub bench      Prior Function Prior Level of Function : Independent/Modified Independent;Driving             Mobility Comments: Independent without AD unless walking around her "land" when she used a walking stick.       Hand Dominance        Extremity/Trunk Assessment   Upper Extremity Assessment Upper Extremity Assessment: Generalized weakness    Lower Extremity Assessment Lower Extremity Assessment: Generalized weakness;RLE deficits/detail;LLE deficits/detail RLE Deficits / Details: 3-/5 knee extension in sitting LLE Deficits / Details: 2/5 knee extension in sitting       Communication   Communication: No difficulties  Cognition Arousal/Alertness: Awake/alert Behavior During Therapy: WFL for tasks assessed/performed Overall Cognitive Status: Within Functional Limits for tasks assessed                                          General Comments      Exercises     Assessment/Plan    PT Assessment Patient needs continued PT services  PT Problem List Decreased strength;Cardiopulmonary status limiting activity;Decreased range of motion;Decreased activity tolerance;Decreased balance;Decreased mobility;Decreased safety awareness;Decreased knowledge of use of DME;Decreased knowledge of precautions       PT Treatment Interventions DME instruction;Therapeutic exercise;Gait training;Balance training;Wheelchair mobility training;Stair training;Functional mobility training;Therapeutic activities;Patient/family education;Manual techniques;Modalities;Neuromuscular re-education    PT Goals (Current goals can be found in the Care Plan section)  Acute Rehab PT Goals Patient Stated Goal: feel better PT Goal Formulation: With patient Time For Goal Achievement: 02/17/23 Potential to Achieve Goals: Good    Frequency 7X/week     Co-evaluation PT/OT/SLP  Co-Evaluation/Treatment: Yes Reason for Co-Treatment: To address functional/ADL transfers (unsure of pt's ability to tolerate 2 sessions) PT goals addressed during session: Mobility/safety with mobility;Balance         AM-PAC PT "6 Clicks" Mobility  Outcome Measure Help needed turning from your back to your side while in a flat bed without using bedrails?: A Little Help needed moving from lying on your back to sitting on the side of a flat bed without using bedrails?: A Lot Help needed moving to and from a bed to a chair (including a wheelchair)?: A Little Help needed standing up from a chair using your arms (e.g., wheelchair or bedside chair)?: A Lot Help needed to walk in hospital room?: Total Help needed climbing 3-5 steps with a railing? : Total 6 Click Score: 12    End of Session Equipment Utilized During Treatment: Oxygen Activity Tolerance:  (limited 2/2 nausea & dizziness) Patient left: in chair;with chair alarm set;with call bell/phone within reach Nurse Communication: Mobility status;Weight bearing status PT Visit Diagnosis: Difficulty in walking, not elsewhere classified (R26.2);Other abnormalities of gait and mobility (R26.89);Muscle weakness (generalized) (M62.81)    Time: 1610-9604 PT Time Calculation (min) (ACUTE ONLY): 21 min   Charges:   PT Evaluation $PT Eval Moderate Complexity: 1 Mod          Aleda Grana, PT, DPT 02/03/23, 10:27 AM   Sandi Mariscal 02/03/2023, 10:25 AM

## 2023-02-03 NOTE — Progress Notes (Signed)
Anticoagulation monitoring(Lovenox):  82yo  F ordered Lovenox 40 mg Q24h    Filed Weights   02/01/23 1756  Weight: 64.4 kg (142 lb)   BMI 24   Lab Results  Component Value Date   CREATININE 1.35 (H) 02/03/2023   CREATININE 0.72 02/02/2023   CREATININE 0.88 02/01/2023   Estimated Creatinine Clearance: 27.3 mL/min (A) (by C-G formula based on SCr of 1.35 mg/dL (H)). Hemoglobin & Hematocrit     Component Value Date/Time   HGB 12.1 02/03/2023 0519   HCT 37.0 02/03/2023 0519     Per Protocol for Patient with estCrcl < 30 ml/min and BMI < 30, will transition to Lovenox 30 mg Q24h       Bari Mantis PharmD Clinical Pharmacist 02/03/2023

## 2023-02-04 ENCOUNTER — Inpatient Hospital Stay: Payer: Medicare PPO

## 2023-02-04 ENCOUNTER — Other Ambulatory Visit (HOSPITAL_COMMUNITY): Payer: Self-pay

## 2023-02-04 ENCOUNTER — Encounter: Payer: Self-pay | Admitting: Family Medicine

## 2023-02-04 ENCOUNTER — Telehealth (HOSPITAL_COMMUNITY): Payer: Self-pay | Admitting: Pharmacy Technician

## 2023-02-04 DIAGNOSIS — J189 Pneumonia, unspecified organism: Secondary | ICD-10-CM | POA: Diagnosis not present

## 2023-02-04 DIAGNOSIS — W19XXXA Unspecified fall, initial encounter: Secondary | ICD-10-CM

## 2023-02-04 DIAGNOSIS — S72002A Fracture of unspecified part of neck of left femur, initial encounter for closed fracture: Secondary | ICD-10-CM | POA: Diagnosis not present

## 2023-02-04 DIAGNOSIS — K567 Ileus, unspecified: Secondary | ICD-10-CM

## 2023-02-04 DIAGNOSIS — K9189 Other postprocedural complications and disorders of digestive system: Secondary | ICD-10-CM | POA: Diagnosis not present

## 2023-02-04 DIAGNOSIS — S72012A Unspecified intracapsular fracture of left femur, initial encounter for closed fracture: Principal | ICD-10-CM

## 2023-02-04 DIAGNOSIS — I480 Paroxysmal atrial fibrillation: Secondary | ICD-10-CM | POA: Diagnosis not present

## 2023-02-04 DIAGNOSIS — I160 Hypertensive urgency: Secondary | ICD-10-CM | POA: Diagnosis not present

## 2023-02-04 LAB — CBC
HCT: 34.2 % — ABNORMAL LOW (ref 36.0–46.0)
Hemoglobin: 11.4 g/dL — ABNORMAL LOW (ref 12.0–15.0)
MCH: 29 pg (ref 26.0–34.0)
MCHC: 33.3 g/dL (ref 30.0–36.0)
MCV: 87 fL (ref 80.0–100.0)
Platelets: 186 10*3/uL (ref 150–400)
RBC: 3.93 MIL/uL (ref 3.87–5.11)
RDW: 14.7 % (ref 11.5–15.5)
WBC: 13.8 10*3/uL — ABNORMAL HIGH (ref 4.0–10.5)
nRBC: 0 % (ref 0.0–0.2)

## 2023-02-04 LAB — BASIC METABOLIC PANEL
Anion gap: 7 (ref 5–15)
BUN: 23 mg/dL (ref 8–23)
CO2: 22 mmol/L (ref 22–32)
Calcium: 8 mg/dL — ABNORMAL LOW (ref 8.9–10.3)
Chloride: 104 mmol/L (ref 98–111)
Creatinine, Ser: 0.82 mg/dL (ref 0.44–1.00)
GFR, Estimated: >60 mL/min (ref >60)
Glucose, Bld: 130 mg/dL — ABNORMAL HIGH (ref 70–99)
Potassium: 3.8 mmol/L (ref 3.5–5.1)
Sodium: 133 mmol/L — ABNORMAL LOW (ref 135–145)

## 2023-02-04 LAB — ECHOCARDIOGRAM COMPLETE
AR max vel: 1.83 cm2
AV Area VTI: 2.05 cm2
AV Area mean vel: 1.8 cm2
AV Mean grad: 9 mmHg
AV Peak grad: 11.4 mmHg
Ao pk vel: 1.69 m/s
Area-P 1/2: 2.93 cm2
Height: 64 in
S' Lateral: 2.3 cm

## 2023-02-04 LAB — TROPONIN I (HIGH SENSITIVITY)
Troponin I (High Sensitivity): 17 ng/L (ref <18)
Troponin I (High Sensitivity): 18 ng/L — ABNORMAL HIGH (ref <18)

## 2023-02-04 LAB — HEPARIN LEVEL (UNFRACTIONATED)
Heparin Unfractionated: <0.1 IU/mL — ABNORMAL LOW (ref 0.30–0.70)
Heparin Unfractionated: <0.1 IU/mL — ABNORMAL LOW (ref 0.30–0.70)

## 2023-02-04 MED ORDER — SODIUM CHLORIDE 0.9 % IV SOLN
100.0000 mg | Freq: Two times a day (BID) | INTRAVENOUS | Status: AC
  Administered 2023-02-04 – 2023-02-08 (×10): 100 mg via INTRAVENOUS
  Filled 2023-02-04 (×10): qty 100

## 2023-02-04 MED ORDER — HEPARIN BOLUS VIA INFUSION
1900.0000 [IU] | Freq: Once | INTRAVENOUS | Status: AC
  Administered 2023-02-04: 1900 [IU] via INTRAVENOUS
  Filled 2023-02-04: qty 1900

## 2023-02-04 MED ORDER — HEPARIN (PORCINE) 25000 UT/250ML-% IV SOLN
1500.0000 [IU]/h | INTRAVENOUS | Status: DC
  Administered 2023-02-04: 1000 [IU]/h via INTRAVENOUS
  Administered 2023-02-04: 1300 [IU]/h via INTRAVENOUS
  Administered 2023-02-05: 1400 [IU]/h via INTRAVENOUS
  Administered 2023-02-06: 1500 [IU]/h via INTRAVENOUS
  Filled 2023-02-04 (×3): qty 250

## 2023-02-04 MED ORDER — HEPARIN BOLUS VIA INFUSION
2000.0000 [IU] | Freq: Once | INTRAVENOUS | Status: AC
  Administered 2023-02-04: 2000 [IU] via INTRAVENOUS
  Filled 2023-02-04: qty 2000

## 2023-02-04 MED ORDER — POTASSIUM CHLORIDE 2 MEQ/ML IV SOLN
INTRAVENOUS | Status: AC
  Filled 2023-02-04 (×2): qty 1000

## 2023-02-04 MED ORDER — KCL-LACTATED RINGERS 20 MEQ/L IV SOLN: INTRAVENOUS | Status: DC

## 2023-02-04 NOTE — Progress Notes (Signed)
Rounding Note    Patient Name: Rachael Austin Date of Encounter: 02/04/2023  [facility] Cardiologist: None   Subjective   Last night patient had tachycardia with rates in the 170s given IV metoprolol, found to have afib/flutter and intermittent SVT.   Patient had afib episode overnight. She is currently in NSR. She is overall fatigue. She denies chest pain.   Inpatient Medications    Scheduled Meds:  docusate sodium  100 mg Oral BID   feeding supplement  237 mL Oral BID BM   hydrALAZINE  25 mg Oral BID   multivitamin with minerals  1 tablet Oral Daily   pantoprazole (PROTONIX) IV  40 mg Intravenous Q12H   pravastatin  20 mg Oral q1800   senna  1 tablet Oral BID   Continuous Infusions:  sodium chloride 100 mL/hr at 02/04/23 0333   cefTRIAXone (ROCEPHIN)  IV Stopped (02/04/23 0040)   diltiazem (CARDIZEM) infusion 7.5 mg/hr (02/04/23 0907)   heparin 1,000 Units/hr (02/04/23 0625)   methocarbamol (ROBAXIN) IV     PRN Meds: acetaminophen, alum & mag hydroxide-simeth, bisacodyl, hydrALAZINE, HYDROcodone-acetaminophen, labetalol, menthol-cetylpyridinium **OR** phenol, methocarbamol **OR** methocarbamol (ROBAXIN) IV, morphine injection, polyethylene glycol, prochlorperazine   Vital Signs    Vitals:   02/04/23 0501 02/04/23 0615 02/04/23 0650 02/04/23 0700  BP: (!) 172/65 (!) 152/55 (!) 177/54 (!) 130/48  Pulse: 90 87 85 75  Resp: (!) 24 (!) 22 17 20   Temp: 98.1 F (36.7 C)     TempSrc: Oral     SpO2: 96% 94% 95% 95%  Weight: 71.4 kg     Height:        Intake/Output Summary (Last 24 hours) at 02/04/2023 1031 Last data filed at 02/04/2023 0907 Gross per 24 hour  Intake 1111.91 ml  Output 560 ml  Net 551.91 ml      02/04/2023    5:01 AM 02/01/2023    5:56 PM 11/04/2022   11:04 AM  Last 3 Weights  Weight (lbs) 157 lb 6.5 oz 142 lb 146 lb 12.8 oz  Weight (kg) 71.4 kg 64.411 kg 66.588 kg      Telemetry    Afib/flutter overnight, now in NSR 80s-  Personally Reviewed  ECG     No new - Personally Reviewed  Physical Exam   GEN: No acute distress.   Neck: No JVD Cardiac: RRR, no murmurs, rubs, or gallops.  Respiratory: Clear to auscultation bilaterally. GI: Soft, nontender, non-distended  MS: No edema; No deformity. Neuro:  Nonfocal  Psych: Normal affect   Labs    High Sensitivity Troponin:   Recent Labs  Lab 02/03/23 1747 02/03/23 2329 02/04/23 0411  TROPONINIHS 15 17 18*     Chemistry Recent Labs  Lab 02/02/23 0425 02/03/23 0519 02/03/23 1747 02/04/23 0411  NA 138 134* 131* 133*  K 3.7 4.0 3.8 3.8  CL 106 104 97* 104  CO2 22 21* 18* 22  GLUCOSE 175* 147* 150* 130*  BUN 9 20 25* 23  CREATININE 0.72 1.35* 1.11* 0.82  CALCIUM 8.5* 8.5* 8.6* 8.0*  MG 2.2  --   --   --   GFRNONAA >60 39* 49* >60  ANIONGAP 10 9 16* 7    Lipids No results for input(s): "CHOL", "TRIG", "HDL", "LABVLDL", "LDLCALC", "CHOLHDL" in the last 168 hours.  Hematology Recent Labs  Lab 02/03/23 0519 02/03/23 1747 02/04/23 0411  WBC 11.6* 16.0* 13.8*  RBC 4.20 4.34 3.93  HGB 12.1 12.5 11.4*  HCT  37.0 37.5 34.2*  MCV 88.1 86.4 87.0  MCH 28.8 28.8 29.0  MCHC 32.7 33.3 33.3  RDW 15.0 14.7 14.7  PLT 192 198 186   Thyroid No results for input(s): "TSH", "FREET4" in the last 168 hours.  BNPNo results for input(s): "BNP", "PROBNP" in the last 168 hours.  DDimer No results for input(s): "DDIMER" in the last 168 hours.   Radiology    ECHOCARDIOGRAM COMPLETE  Result Date: 02/04/2023    ECHOCARDIOGRAM REPORT   Patient Name:   Rachael Austin [facility] Date of Exam: 02/03/2023 Medical Rec #:  [id number]          Height:       64.0 in Accession #:    [id number]         Weight:       142.0 lb Date of Birth:  10-Aug-1941          BSA:          1.691 m Patient Age:    82 years           BP:           159/63 mmHg Patient Gender: F                  HR:           115 bpm. Exam Location:  [facility] Procedure: 2D Echo, Cardiac Doppler and Color Doppler  Indications:     R07.9 Chest pain  History:         Patient has prior history of Echocardiogram examinations, most                  recent 11/21/2021.  Sonographer:     Daphine Deutscher RDCS Referring Phys:  [id number] [address] END Diagnosing Phys: Yvonne Kendall MD IMPRESSIONS  1. Left ventricular ejection fraction, by estimation, is 65 to 70%. The left ventricle has normal function. The left ventricle has no regional wall motion abnormalities. There is mild left ventricular hypertrophy. Left ventricular diastolic parameters are consistent with Grade I diastolic dysfunction (impaired relaxation). Elevated left atrial pressure.  2. Right ventricular systolic function is mildly reduced. The right ventricular size is normal. There is normal pulmonary artery systolic pressure.  3. The mitral valve is normal in structure. Trivial mitral valve regurgitation. No evidence of mitral stenosis.  4. Tricuspid valve regurgitation is mild to moderate.  5. The aortic valve has an indeterminant number of cusps. There is mild calcification of the aortic valve. There is mild thickening of the aortic valve. Aortic valve regurgitation is not visualized. Aortic valve sclerosis/calcification is present, without any evidence of aortic stenosis.  6. The inferior vena cava is normal in size with <50% respiratory variability, suggesting right atrial pressure of 8 mmHg. FINDINGS  Left Ventricle: Left ventricular ejection fraction, by estimation, is 65 to 70%. The left ventricle has normal function. The left ventricle has no regional wall motion abnormalities. The left ventricular internal cavity size was normal in size. There is  mild left ventricular hypertrophy. Left ventricular diastolic parameters are consistent with Grade I diastolic dysfunction (impaired relaxation). Elevated left atrial pressure. Right Ventricle: The right ventricular size is normal. No increase in right ventricular wall thickness. Right ventricular systolic  function is mildly reduced. There is normal pulmonary artery systolic pressure. The tricuspid regurgitant velocity is 2.50 m/s, and with an assumed right atrial pressure of 8 mmHg, the estimated right ventricular systolic pressure is 33.0 mmHg. Left Atrium: Left atrial size was normal  in size. Right Atrium: Right atrial size was normal in size. Pericardium: There is no evidence of pericardial effusion. Mitral Valve: The mitral valve is normal in structure. Trivial mitral valve regurgitation. No evidence of mitral valve stenosis. Tricuspid Valve: The tricuspid valve is normal in structure. Tricuspid valve regurgitation is mild to moderate. Aortic Valve: The aortic valve has an indeterminant number of cusps. There is mild calcification of the aortic valve. There is mild thickening of the aortic valve. Aortic valve regurgitation is not visualized. Aortic valve sclerosis/calcification is present, without any evidence of aortic stenosis. Aortic valve mean gradient measures 9.0 mmHg. Aortic valve peak gradient measures 11.4 mmHg. Aortic valve area, by VTI measures 2.05 cm. Pulmonic Valve: The pulmonic valve was normal in structure. Pulmonic valve regurgitation is trivial. No evidence of pulmonic stenosis. Aorta: The aortic root is normal in size and structure. Pulmonary Artery: The pulmonary artery is of normal size. Venous: The inferior vena cava is normal in size with less than 50% respiratory variability, suggesting right atrial pressure of 8 mmHg. IAS/Shunts: The interatrial septum was not well visualized.  LEFT VENTRICLE PLAX 2D LVIDd:         3.40 cm   Diastology LVIDs:         2.30 cm   LV e' medial:    6.20 cm/s LV PW:         1.20 cm   LV E/e' medial:  17.3 LV IVS:        1.20 cm   LV e' lateral:   6.05 cm/s LVOT diam:     1.90 cm   LV E/e' lateral: 17.7 LV SV:         53 LV SV Index:   31 LVOT Area:     2.84 cm  RIGHT VENTRICLE             IVC RV Basal diam:  3.30 cm     IVC diam: 1.60 cm RV S prime:     10.22  cm/s LEFT ATRIUM             Index        RIGHT ATRIUM           Index LA diam:        4.20 cm 2.48 cm/m   RA Area:     10.90 cm LA Vol (A2C):   41.3 ml 24.42 ml/m  RA Volume:   25.70 ml  15.19 ml/m LA Vol (A4C):   36.5 ml 21.58 ml/m LA Biplane Vol: 39.2 ml 23.18 ml/m  AORTIC VALVE AV Area (Vmax):    1.83 cm AV Area (Vmean):   1.80 cm AV Area (VTI):     2.05 cm AV Vmax:           168.78 cm/s AV Vmean:          118.474 cm/s AV VTI:            0.259 m AV Peak Grad:      11.4 mmHg AV Mean Grad:      9.0 mmHg LVOT Vmax:         108.75 cm/s LVOT Vmean:        75.350 cm/s LVOT VTI:          0.188 m LVOT/AV VTI ratio: 0.72  AORTA Ao Root diam: 3.30 cm Ao Asc diam:  2.90 cm MITRAL VALVE                TRICUSPID VALVE  MV Area (PHT): 2.93 cm     TR Peak grad:   25.0 mmHg MV Decel Time: 259 msec     TR Vmax:        250.00 cm/s MV E velocity: 107.14 cm/s MV A velocity: 144.50 cm/s  SHUNTS MV E/A ratio:  0.74         Systemic VTI:  0.19 m                             Systemic Diam: 1.90 cm Yvonne Kendall MD Electronically signed by Yvonne Kendall MD Signature Date/Time: 02/04/2023/7:35:40 AM    Final    CT Angio Chest Pulmonary Embolism (PE) W or WO Contrast  Result Date: 02/03/2023 CLINICAL DATA:  High probability of pulmonary embolus. EXAM: CT ANGIOGRAPHY CHEST WITH CONTRAST TECHNIQUE: Multidetector CT imaging of the chest was performed using the standard protocol during bolus administration of intravenous contrast. Multiplanar CT image reconstructions and MIPs were obtained to evaluate the vascular anatomy. RADIATION DOSE REDUCTION: This exam was performed according to the departmental dose-optimization program which includes automated exposure control, adjustment of the mA and/or kV according to patient size and/or use of iterative reconstruction technique. CONTRAST:  60mL OMNIPAQUE IOHEXOL 350 MG/ML SOLN COMPARISON:  None Available. FINDINGS: Cardiovascular: Satisfactory opacification of the pulmonary arteries  to the segmental level. No evidence of pulmonary embolism. Normal heart size. No pericardial effusion. Coronary artery calcifications are noted. Mediastinum/Nodes: Thyroid gland is unremarkable. No adenopathy is noted. Severely dilated and fluid-filled esophagus is noted. Lungs/Pleura: No pneumothorax or pleural effusion is noted. Patchy airspace opacities are noted throughout the upper and lower lobes bilaterally most consistent with multifocal pneumonia. Upper Abdomen: Moderate to severe gastric distention is noted. Musculoskeletal: No chest wall abnormality. No acute or significant osseous findings. Review of the MIP images confirms the above findings. IMPRESSION: No definite evidence of pulmonary embolus. Patchy airspace opacities are noted throughout both lungs most consistent with multifocal pneumonia. Severely dilated and fluid-filled esophagus is noted as well as moderate to severe gastric distention seen in visualized portion of upper abdomen. This is concerning for gastric outlet obstruction. Coronary artery calcifications are noted suggesting coronary artery disease. Aortic Atherosclerosis (ICD10-I70.0). Electronically Signed   By: Lupita Raider M.D.   On: 02/03/2023 19:03   DG HIP UNILAT WITH PELVIS 2-3 VIEWS LEFT  Result Date: 02/02/2023 CLINICAL DATA:  Postop. EXAM: DG HIP (WITH OR WITHOUT PELVIS) 2-3V LEFT COMPARISON:  Preoperative radiograph yesterday FINDINGS: Four screws traverse left femoral neck fracture. Unchanged fracture alignment from preoperative imaging. Recent postsurgical change includes air and edema in the soft tissues. Lateral skin staples in place. IMPRESSION: ORIF left femoral neck fracture. No immediate postoperative complication. Electronically Signed   By: Narda Rutherford M.D.   On: 02/02/2023 15:52   DG HIP UNILAT WITH PELVIS 2-3 VIEWS LEFT  Result Date: 02/02/2023 CLINICAL DATA:  [id number] Surgery, elective [id number] EXAM: DG HIP (WITH OR WITHOUT PELVIS) 2-3V LEFT COMPARISON:   [id number] FINDINGS: Intraoperative images during pin fixation of the left femoral neck for a subcapital femoral neck fracture. Stable alignment. No evidence of immediate hardware complication. IMPRESSION: Intraoperative images during left femoral neck fixation. No evidence of immediate hardware complication. Electronically Signed   By: Caprice Renshaw M.D.   On: 02/02/2023 15:06   DG C-Arm 1-60 Min-No Report  Result Date: 02/02/2023 Fluoroscopy was utilized by the requesting physician.  No radiographic interpretation.    Cardiac Studies  Echo 10/2021 1. Left ventricular ejection fraction, by estimation, is 60 to 65%. The  left ventricle has normal function. The left ventricle has no regional  wall motion abnormalities. There is mild left ventricular hypertrophy of  the basal-septal segment. Left  ventricular diastolic parameters are consistent with Grade I diastolic  dysfunction (impaired relaxation).   2. Right ventricular systolic function is normal. The right ventricular  size is normal.   3. The mitral valve is normal in structure. Mild mitral valve  regurgitation.   4. The aortic valve is calcified. Aortic valve regurgitation is not  visualized. Mild aortic valve stenosis. Aortic valve mean gradient  measures 12.0 mmHg.   5. The inferior vena cava is normal in size with <50% respiratory  variability, suggesting right atrial pressure of 8 mmHg.   Patient Profile     82 y.o. female with h/o HTN, HLD, aortic atherosclerosis, syncope, recurrent falls who is being seen for new afib and chest pain.   Assessment & Plan    Chest pain - s/p left left hip fracture repair with sudden chest pain and EKG changes.  - EKG was abnormal showing anterior Q waves - HS trop 15>17>18, flat trend and not consistent with ACS, started on IV heparin - Chest CTA with no PE, it did show concern for gastric outlet obstruction - was found to have rapid afib/flutter, now in NSR, which  may have been contributing  to chest pain - check echo - continue statin therapy, LDL 86 - coronary artery calcification noted on CT, may eventually benefit from ischemic work-up as outpatient  New onset Afib - new onset afib post-op day one with rates into the 170s started on IV dilt - now in NSR - CHADSVASC at least 4 (female, age x 1, HTN, PAD) - started on IV heparin - keep Mag>2 and K>4 - THS wnl in 10/2022 - continue IV dilt for now, patient has difficulty with PO - will need to consolidate Dilt at some point - patient will require long term DOAC, likely Eliquis. Will likely need heart monitor to monitor for reoccurrence.  HTN - BP high on admission started on IV dilt, now improving - IV dilt - hydralazine 25mg  BID  Left hip fracture s/p repair - per ortho/IM  For questions or updates, please contact [facility] Please consult [email or web address] for contact info under        Signed,  David Stall, PA-C  02/04/2023, 10:31 AM

## 2023-02-04 NOTE — Care Management Important Message (Signed)
Important Message  Patient Details  Name: Rachael Austin MRN: [id number] Date of Birth: January 20, 1941   Medicare Important Message Given:  N/A - LOS <3 / Initial given by admissions     [name] 02/04/2023, 12:22 PM

## 2023-02-04 NOTE — Progress Notes (Signed)
   02/03/23 2100  Assess: MEWS Score  BP 127/80  MAP (mmHg) 94  Pulse Rate (!) 115  ECG Heart Rate (!) 159  Resp (!) 27  SpO2 94 %  Assess: MEWS Score  MEWS Temp 0  MEWS Systolic 0  MEWS Pulse 3  MEWS RR 2  MEWS LOC 0  MEWS Score 5  MEWS Score Color Red  Assess: if the MEWS score is Yellow or Red  Were vital signs taken at a resting state? Yes  Focused Assessment Change from prior assessment (see assessment flowsheet)  Does the patient meet 2 or more of the SIRS criteria? No  MEWS guidelines implemented  Yes, red  Treat  MEWS Interventions Considered administering scheduled or prn medications/treatments as ordered  Take Vital Signs  Increase Vital Sign Frequency  Red: Q1hr x2, continue Q4hrs until patient remains green for 12hrs  Escalate  MEWS: Escalate Red: Discuss with charge nurse and notify provider. Consider notifying RRT. If remains red for 2 hours consider need for higher level of care  Notify: Charge Nurse/RN  Name of Charge Nurse/RN Notified Paulino Rily, RN  Provider Notification  Provider Name/Title Bishop Limbo, RN  Date Provider Notified 02/03/23  Time Provider Notified 2107  Method of Notification Page  Notification Reason Change in status (Afib RVR)  Provider response See new orders  Date of Provider Response 02/03/23  Time of Provider Response 2107  Assess: SIRS CRITERIA  SIRS Temperature  0  SIRS Pulse 1  SIRS Respirations  1  SIRS WBC 1  SIRS Score Sum  3

## 2023-02-04 NOTE — Consult Note (Signed)
ANTICOAGULATION CONSULT NOTE - Initial Consult  Pharmacy Consult for heparin infusion Indication: chest pain/ACS  Allergies  Allergen Reactions   Augmentin [Amoxicillin-Pot Clavulanate] Swelling and Rash    Swelling of the lips   Lexapro [Escitalopram Oxalate]    Micardis [Telmisartan] Itching   Myrbetriq [Mirabegron] Itching   Niacin And Related Itching   Codeine Sulfate Other (See Comments)    "Makes her hyper"   Vesicare [Solifenacin Succinate] Nausea And Vomiting and Rash    Patient Measurements: Height: 5\' 4"  (162.6 cm) Weight: 71.4 kg (157 lb 6.5 oz) (verfied by Katie T, NT. 1 sheet, 1 blanket, 1 chux pad, 1 pillow) IBW/kg (Calculated) : 54.7 Heparin Dosing Weight: 64.4 kg   Vital Signs: Temp: 98.1 F (36.7 C) (05/08 0501) Temp Source: Oral (05/08 0501) BP: 172/65 (05/08 0501) Pulse Rate: 90 (05/08 0501)  Labs: Recent Labs    02/03/23 0519 02/03/23 1747 02/03/23 2329 02/04/23 0411  HGB 12.1 12.5  --  11.4*  HCT 37.0 37.5  --  34.2*  PLT 192 198  --  186  HEPARINUNFRC  --   --   --  <0.10*  CREATININE 1.35* 1.11*  --  0.82  TROPONINIHS  --  15 17 18*     Estimated Creatinine Clearance: 50.4 mL/min (by C-G formula based on SCr of 0.82 mg/dL).   Medical History: Past Medical History:  Diagnosis Date   AK (actinic keratosis) 11/12/2022   left upper arm, tx'd with EDC   AK (actinic keratosis) 11/12/2022   right medial pretibia, LN2 11/25/22   Basal cell carcinoma 06/21/2020   left post shoulder sup, left mid chest, left lat thigh   Basal cell carcinoma 11/07/2021   L upper back, EDC   Basal cell carcinoma 11/07/2021   R upper back, EDC   Basal cell carcinoma 11/27/2021   right upper arm, EDC   Basal cell carcinoma 11/27/2021   right shoulder posterior, EDC   Basal cell carcinoma 11/27/2021   right anterior shoulder, EDC   Basal cell carcinoma (BCC) 06/13/2021   left dorsal foot, EDC 10/02/2021   Basal cell carcinoma (BCC) 06/13/2021   right  postauricular tx'd w/ EDC   BCC (basal cell carcinoma of skin) 03/30/2007   R inf med pretibial - BCC   BCC (basal cell carcinoma of skin) 10/12/2013   L nasal ala - BCC   BCC (basal cell carcinoma of skin) 03/10/2018   L mid dorsum med forearm - superficial BCC    BCC (basal cell carcinoma) 10/02/2021   left dorsal foot proximal, EDC 10/02/2021   BCC (basal cell carcinoma) 10/02/2021   left mid back, superficial EDC 11/07/21   BCC (basal cell carcinoma) 10/02/2021   right pretibia   BCC (basal cell carcinoma) 10/02/2021   right mid back, superficial EDC 11/07/21   BCC (basal cell carcinoma) 08/14/2022   left distal calf, tx'd with EDC   BCC (basal cell carcinoma) 11/12/2022   superficial at left lateral pretibial,l tx'd with EDC   BCC (basal cell carcinoma) 11/12/2022   superficial at mid back right of midline, scheduled for EDC   BCC (basal cell carcinoma) 11/12/2022   mid back right of midline, [facility] 11/25/22   Breast screening, unspecified 2013   Cancer (HCC) 1992   skin   Degenerative disk disease    GERD (gastroesophageal reflux disease)    History of basal cell carcinoma (BCC) 08/29/2020    left inferior knee lateral, , left inferior knee medial, right pretibia  History of SCC (squamous cell carcinoma) of skin 08/29/2020   right posterior calf, left lateral calf, and    Hypercholesterolemia 2008   Interstitial cystitis    followed by Dr Achilles Dunk   Other sign and symptom in breast 2013   Left upper outer quadrant breast "soreness" Ultrasound exam of right breast in the 2 o'clock position with the breast distracted medially showed a 0.3-0.4 with 0.5 cm simple cyst. In the 1 o'clock position where pt reported tenderness US exam was negatiive.  Because of her history of intermittent nipple drainage, ultrasound was completed of the retroareolar area.   Personal history of tobacco use, presenting hazards to health    PONV (postoperative nausea and vomiting)    SCC (squamous cell  carcinoma) 03/28/2008   R dorsum hand - SCC   SCC (squamous cell carcinoma) 05/09/2009   R lat lower leg - SCCIS   SCC (squamous cell carcinoma) 05/09/2009   L lat lower leg - SCCIS   SCC (squamous cell carcinoma) 04/07/2013   L lower leg - SCCIS   SCC (squamous cell carcinoma) 04/27/2013   R forearm - SCC   SCC (squamous cell carcinoma) 04/28/2017   L lat knee - SCCIS   SCC (squamous cell carcinoma) 05/12/2018   L prox dorsum forearm - SCC   SCC (squamous cell carcinoma) 06/13/2021   left forearm tx'd w/ EDC   SCC (squamous cell carcinoma), leg, right 03/30/2007   R sup pretibial - SCCIS   SCC (squamous cell carcinoma);BCC 10/12/2013   Mid back - superficial BCC with SCCIS    Special screening for malignant neoplasms, colon    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   left dorsal forearm, left lat calf   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 Last dose 5/7 @ 0900  Assessment: 82 y.o. female with a hx of hypertension, hyperlipidemia, aortic atherosclerosis, syncope, and recurrent falls, admitted 02/01/23 with left hip fracture 2/2 mechanical fall. Post-Op Day 1 Left femoral neck fracture fixation on 02/02/23. On 02/03/23 patient with new onset  chest pain and abnormal EKG. Pharmacy has been consulted to initiate and manage IV heparin therapy.  Initial troponin 15.  Goal of Therapy:  Heparin level 0.3-0.7 units/ml Monitor platelets by anticoagulation protocol: Yes   Plan:  5/8:  HL @ 0411 = < 0.1, SUBtherapeutic - Will order heparin 1900 units IV X 1 bolus and increase drip rate to 1000 units/hr.  - Will recheck HL 8 hrs after rate change  , D, PharmD Clinical Pharmacist   02/04/2023,6:03 AM

## 2023-02-04 NOTE — Progress Notes (Signed)
OT Cancellation Note  Patient Details Name: Rachael Austin MRN: [id number] DOB: 08-Sep-1941   Cancelled Treatment:    Reason Eval/Treat Not Completed: Patient not medically ready. Chart reviewed. Pt transferred to higher level of care with significant cardiac issue now with postoperative ileus per chart. Will complete OT orders, please re-consult as pt appropriate.   Kathie Dike, M.S. OTR/L  02/04/23, 3:58 PM  ascom [phone]

## 2023-02-04 NOTE — Progress Notes (Addendum)
Progress Note    Rachael Austin  ZOX:[id number] DOB: 07-May-1941  DOA: 02/01/2023 PCP: Dale El Dorado, MD      Brief Narrative:    Medical records reviewed and are as summarized below:  Rachael Austin is a 82 y.o. female with a history significant for hypertension, interstitial cystitis, aortic atherosclerosis, syncope, recurrent falls, who presented to the hospital because of mechanical fall at home.   She was found to have left hip fracture.     Assessment/Plan:   Principal Problem:   Closed left hip fracture, initial encounter (HCC) Active Problems:   Hypercholesterolemia   Hypertensive urgency   Prolonged QT interval   Subcapital fracture of hip, left, closed, initial encounter (HCC)   PAF (paroxysmal atrial fibrillation) (HCC)   Ileus (HCC)   Ileus, postoperative (HCC)   Multifocal pneumonia   Nutrition Problem: Increased nutrient needs Etiology: post-op healing  Signs/Symptoms: estimated needs   Body mass index is 27.02 kg/m.    Left hip fracture: S/p percutaneous fixation of left femoral neck hip fracture on 02/02/2023.   Multifocal pneumonia: Continue IV ceftriaxone.  Add IV doxycycline.   Ileus: CT chest incidentally showed gastric outlet obstruction.  Consulted Dr. Mikki Harbor, general surgeon who evaluated the patient.  He thinks patient has ileus and not gastric bowel obstruction.  He placed an NG tube for gastric decompression. Patient is NPO.  Restart IV fluids.  Chest pain/epigastric pain: Her pain is mostly in the epigastric region which could be from ileus.  No acute PE on CT chest.  No plan for ischemic workup.   Paroxysmal atrial fibrillation with RVR: He converted to normal sinus rhythm.  She remains on IV Cardizem infusion because she is NPO.  Continue IV heparin drip and monitor heparin level per protocol.  Discussed plan with Dr. Okey Dupre, cardiologist.  Follow-up with cardiologist.  2D echo showed EF estimated at 65 to 70%, mild  LVH, grade 1 diastolic dysfunction, mild to moderate TR   AKI: Creatinine is better.   Hypertensive urgency: BP is better.  Continue antihypertensives   Prolonged QTc interval: Improved from 500-454.   Diet Order             Diet NPO time specified  Diet effective now                            Consultants: Orthopedic surgeon Cardiologist General surgeon  Procedures: Percutaneous fixation of left femoral neck fracture on 02/02/2023    Medications:    docusate sodium  100 mg Oral BID   feeding supplement  237 mL Oral BID BM   heparin  2,000 Units Intravenous Once   hydrALAZINE  25 mg Oral BID   multivitamin with minerals  1 tablet Oral Daily   pantoprazole (PROTONIX) IV  40 mg Intravenous Q12H   pravastatin  20 mg Oral q1800   senna  1 tablet Oral BID   Continuous Infusions:  cefTRIAXone (ROCEPHIN)  IV Stopped (02/04/23 0040)   diltiazem (CARDIZEM) infusion 7.5 mg/hr (02/04/23 1348)   doxycycline (VIBRAMYCIN) IV 100 mg (02/04/23 1358)   heparin 1,000 Units/hr (02/04/23 1557)   lactated ringers with KCl 20 mEq/L     methocarbamol (ROBAXIN) IV       Anti-infectives (From admission, onward)    Start     Dose/Rate Route Frequency Ordered Stop   02/04/23 1200  doxycycline (VIBRAMYCIN) 100 mg in sodium chloride 0.9 % 250 mL IVPB  100 mg 125 mL/hr over 120 Minutes Intravenous 2 times daily 02/04/23 1105     02/03/23 2100  cefTRIAXone (ROCEPHIN) 1 g in sodium chloride 0.9 % 100 mL IVPB        1 g 200 mL/hr over 30 Minutes Intravenous Every 24 hours 02/03/23 1958 02/08/23 2159   02/02/23 2000  ceFAZolin (ANCEF) IVPB 2g/100 mL premix        2 g 200 mL/hr over 30 Minutes Intravenous Every 6 hours 02/02/23 1902 02/03/23 0851   02/02/23 0600  ceFAZolin (ANCEF) IVPB 2g/100 mL premix        2 g 200 mL/hr over 30 Minutes Intravenous On call to O.R. 02/01/23 2236 02/02/23 1330              Family Communication/Anticipated D/C date and  plan/Code Status   DVT prophylaxis: heparin bolus via infusion 2,000 Units Start: 02/04/23 1715 SCDs Start: 02/02/23 1902 Place TED hose Start: 02/02/23 1902 SCDs Start: 02/01/23 2015     Code Status: Full Code  Family Communication: Plan discussed with Annabelle Harman, daughter, at the bedside Disposition Plan: May be ready for discharge in about 4 days   Status is: Inpatient Remains inpatient appropriate because: Ileus, paroxysmal atrial fibrillation       Subjective:   Interval events noted.  She complains of epigastric pain.  She also complained of cough and she said "it hurts to cough".  No chest pain or shortness of breath.  Objective:    Vitals:   02/04/23 0501 02/04/23 0615 02/04/23 0650 02/04/23 0700  BP: (!) 172/65 (!) 152/55 (!) 177/54 (!) 130/48  Pulse: 90 87 85 75  Resp: (!) 24 (!) 22 17 20   Temp: 98.1 F (36.7 C)   98 F (36.7 C)  TempSrc: Oral   Oral  SpO2: 96% 94% 95% 95%  Weight: 71.4 kg     Height:       No data found.   Intake/Output Summary (Last 24 hours) at 02/04/2023 1641 Last data filed at 02/04/2023 1557 Gross per 24 hour  Intake 1225.96 ml  Output 560 ml  Net 665.96 ml   Filed Weights   02/01/23 1756 02/04/23 0501  Weight: 64.4 kg 71.4 kg    Exam:  GEN: NAD SKIN: Warm and dry EYES: No pallor or icterus ENT: MMM CV: RRR PULM: CTA B ABD: soft, mild distension, NT, +BS CNS: AAO x 3, non focal EXT: Incisional wound on left hip is clean, dry and intact.  Mild swelling at the left hip.        Data Reviewed:   I have personally reviewed following labs and imaging studies:  Labs: Labs show the following:   Basic Metabolic Panel: Recent Labs  Lab 02/01/23 1813 02/02/23 0425 02/03/23 0519 02/03/23 1747 02/04/23 0411  NA 138 138 134* 131* 133*  K 3.7 3.7 4.0 3.8 3.8  CL 104 106 104 97* 104  CO2 26 22 21* 18* 22  GLUCOSE 109* 175* 147* 150* 130*  BUN 11 9 20  25* 23  CREATININE 0.88 0.72 1.35* 1.11* 0.82  CALCIUM 9.0 8.5*  8.5* 8.6* 8.0*  MG  --  2.2  --   --   --    GFR Estimated Creatinine Clearance: 50.4 mL/min (by C-G formula based on SCr of 0.82 mg/dL). Liver Function Tests: No results for input(s): "AST", "ALT", "ALKPHOS", "BILITOT", "PROT", "ALBUMIN" in the last 168 hours. No results for input(s): "LIPASE", "AMYLASE" in the last 168 hours. No results for  input(s): "AMMONIA" in the last 168 hours. Coagulation profile No results for input(s): "INR", "PROTIME" in the last 168 hours.  CBC: Recent Labs  Lab 02/01/23 1813 02/02/23 0425 02/03/23 0519 02/03/23 1747 02/04/23 0411  WBC 7.2 11.2* 11.6* 16.0* 13.8*  NEUTROABS 4.6  --   --   --   --   HGB 12.5 13.3 12.1 12.5 11.4*  HCT 38.4 40.2 37.0 37.5 34.2*  MCV 88.9 87.4 88.1 86.4 87.0  PLT 226 204 192 198 186   Cardiac Enzymes: No results for input(s): "CKTOTAL", "CKMB", "CKMBINDEX", "TROPONINI" in the last 168 hours. BNP (last 3 results) No results for input(s): "PROBNP" in the last 8760 hours. CBG: Recent Labs  Lab 02/03/23 1712 02/03/23 1916 02/03/23 2121  GLUCAP 148* 144* 136*   D-Dimer: No results for input(s): "DDIMER" in the last 72 hours. Hgb A1c: No results for input(s): "HGBA1C" in the last 72 hours. Lipid Profile: No results for input(s): "CHOL", "HDL", "LDLCALC", "TRIG", "CHOLHDL", "LDLDIRECT" in the last 72 hours. Thyroid function studies: No results for input(s): "TSH", "T4TOTAL", "T3FREE", "THYROIDAB" in the last 72 hours.  Invalid input(s): "FREET3" Anemia work up: No results for input(s): "VITAMINB12", "FOLATE", "FERRITIN", "TIBC", "IRON", "RETICCTPCT" in the last 72 hours. Sepsis Labs: Recent Labs  Lab 02/02/23 0425 02/03/23 0519 02/03/23 1747 02/04/23 0411  WBC 11.2* 11.6* 16.0* 13.8*    Microbiology No results found for this or any previous visit (from the past 240 hour(s)).  Procedures and diagnostic studies:  DG ABD ACUTE 2+V W 1V CHEST  Result Date: 02/04/2023 CLINICAL DATA:  Gastric outlet  obstruction. EXAM: DG ABDOMEN ACUTE WITH 1 VIEW CHEST COMPARISON:  Feb 01, 2023. FINDINGS: Mildly dilated small bowel loops are noted concerning for distal small bowel obstruction or ileus. Residual contrast is noted in urinary bladder. No radiopaque calculi or other significant radiographic abnormality is seen. Heart size and mediastinal contours are within normal limits. Mild bilateral perihilar opacities are noted concerning for pulmonary edema or inflammation, right greater than left. IMPRESSION: Dilated small bowel loops are noted concerning for distal small bowel obstruction or ileus. Bilateral lung opacities are noted, right greater than left, concerning for pulmonary edema or inflammation. Electronically Signed   By: Lupita Raider M.D.   On: 02/04/2023 12:55   ECHOCARDIOGRAM COMPLETE  Result Date: 02/04/2023    ECHOCARDIOGRAM REPORT   Patient Name:   YVONNDA STERNER [facility] Date of Exam: 02/03/2023 Medical Rec #:  [id number]          Height:       64.0 in Accession #:    [id number]         Weight:       142.0 lb Date of Birth:  1941/09/16          BSA:          1.691 m Patient Age:    82 years           BP:           159/63 mmHg Patient Gender: F                  HR:           115 bpm. Exam Location:  [facility] Procedure: 2D Echo, Cardiac Doppler and Color Doppler Indications:     R07.9 Chest pain  History:         Patient has prior history of Echocardiogram examinations, most  recent 11/21/2021.  Sonographer:     Daphine Deutscher RDCS Referring Phys:  [id number] CHRISTOPHER END Diagnosing Phys: Yvonne Kendall MD IMPRESSIONS  1. Left ventricular ejection fraction, by estimation, is 65 to 70%. The left ventricle has normal function. The left ventricle has no regional wall motion abnormalities. There is mild left ventricular hypertrophy. Left ventricular diastolic parameters are consistent with Grade I diastolic dysfunction (impaired relaxation). Elevated left atrial pressure.  2. Right ventricular  systolic function is mildly reduced. The right ventricular size is normal. There is normal pulmonary artery systolic pressure.  3. The mitral valve is normal in structure. Trivial mitral valve regurgitation. No evidence of mitral stenosis.  4. Tricuspid valve regurgitation is mild to moderate.  5. The aortic valve has an indeterminant number of cusps. There is mild calcification of the aortic valve. There is mild thickening of the aortic valve. Aortic valve regurgitation is not visualized. Aortic valve sclerosis/calcification is present, without any evidence of aortic stenosis.  6. The inferior vena cava is normal in size with <50% respiratory variability, suggesting right atrial pressure of 8 mmHg. FINDINGS  Left Ventricle: Left ventricular ejection fraction, by estimation, is 65 to 70%. The left ventricle has normal function. The left ventricle has no regional wall motion abnormalities. The left ventricular internal cavity size was normal in size. There is  mild left ventricular hypertrophy. Left ventricular diastolic parameters are consistent with Grade I diastolic dysfunction (impaired relaxation). Elevated left atrial pressure. Right Ventricle: The right ventricular size is normal. No increase in right ventricular wall thickness. Right ventricular systolic function is mildly reduced. There is normal pulmonary artery systolic pressure. The tricuspid regurgitant velocity is 2.50 m/s, and with an assumed right atrial pressure of 8 mmHg, the estimated right ventricular systolic pressure is 33.0 mmHg. Left Atrium: Left atrial size was normal in size. Right Atrium: Right atrial size was normal in size. Pericardium: There is no evidence of pericardial effusion. Mitral Valve: The mitral valve is normal in structure. Trivial mitral valve regurgitation. No evidence of mitral valve stenosis. Tricuspid Valve: The tricuspid valve is normal in structure. Tricuspid valve regurgitation is mild to moderate. Aortic Valve: The  aortic valve has an indeterminant number of cusps. There is mild calcification of the aortic valve. There is mild thickening of the aortic valve. Aortic valve regurgitation is not visualized. Aortic valve sclerosis/calcification is present, without any evidence of aortic stenosis. Aortic valve mean gradient measures 9.0 mmHg. Aortic valve peak gradient measures 11.4 mmHg. Aortic valve area, by VTI measures 2.05 cm. Pulmonic Valve: The pulmonic valve was normal in structure. Pulmonic valve regurgitation is trivial. No evidence of pulmonic stenosis. Aorta: The aortic root is normal in size and structure. Pulmonary Artery: The pulmonary artery is of normal size. Venous: The inferior vena cava is normal in size with less than 50% respiratory variability, suggesting right atrial pressure of 8 mmHg. IAS/Shunts: The interatrial septum was not well visualized.  LEFT VENTRICLE PLAX 2D LVIDd:         3.40 cm   Diastology LVIDs:         2.30 cm   LV e' medial:    6.20 cm/s LV PW:         1.20 cm   LV E/e' medial:  17.3 LV IVS:        1.20 cm   LV e' lateral:   6.05 cm/s LVOT diam:     1.90 cm   LV E/e' lateral: 17.7 LV SV:  53 LV SV Index:   31 LVOT Area:     2.84 cm  RIGHT VENTRICLE             IVC RV Basal diam:  3.30 cm     IVC diam: 1.60 cm RV S prime:     10.22 cm/s LEFT ATRIUM             Index        RIGHT ATRIUM           Index LA diam:        4.20 cm 2.48 cm/m   RA Area:     10.90 cm LA Vol (A2C):   41.3 ml 24.42 ml/m  RA Volume:   25.70 ml  15.19 ml/m LA Vol (A4C):   36.5 ml 21.58 ml/m LA Biplane Vol: 39.2 ml 23.18 ml/m  AORTIC VALVE AV Area (Vmax):    1.83 cm AV Area (Vmean):   1.80 cm AV Area (VTI):     2.05 cm AV Vmax:           168.78 cm/s AV Vmean:          118.474 cm/s AV VTI:            0.259 m AV Peak Grad:      11.4 mmHg AV Mean Grad:      9.0 mmHg LVOT Vmax:         108.75 cm/s LVOT Vmean:        75.350 cm/s LVOT VTI:          0.188 m LVOT/AV VTI ratio: 0.72  AORTA Ao Root diam: 3.30 cm Ao  Asc diam:  2.90 cm MITRAL VALVE                TRICUSPID VALVE MV Area (PHT): 2.93 cm     TR Peak grad:   25.0 mmHg MV Decel Time: 259 msec     TR Vmax:        250.00 cm/s MV E velocity: 107.14 cm/s MV A velocity: 144.50 cm/s  SHUNTS MV E/A ratio:  0.74         Systemic VTI:  0.19 m                             Systemic Diam: 1.90 cm Yvonne Kendall MD Electronically signed by Yvonne Kendall MD Signature Date/Time: 02/04/2023/7:35:40 AM    Final    CT Angio Chest Pulmonary Embolism (PE) W or WO Contrast  Result Date: 02/03/2023 CLINICAL DATA:  High probability of pulmonary embolus. EXAM: CT ANGIOGRAPHY CHEST WITH CONTRAST TECHNIQUE: Multidetector CT imaging of the chest was performed using the standard protocol during bolus administration of intravenous contrast. Multiplanar CT image reconstructions and MIPs were obtained to evaluate the vascular anatomy. RADIATION DOSE REDUCTION: This exam was performed according to the departmental dose-optimization program which includes automated exposure control, adjustment of the mA and/or kV according to patient size and/or use of iterative reconstruction technique. CONTRAST:  60mL OMNIPAQUE IOHEXOL 350 MG/ML SOLN COMPARISON:  None Available. FINDINGS: Cardiovascular: Satisfactory opacification of the pulmonary arteries to the segmental level. No evidence of pulmonary embolism. Normal heart size. No pericardial effusion. Coronary artery calcifications are noted. Mediastinum/Nodes: Thyroid gland is unremarkable. No adenopathy is noted. Severely dilated and fluid-filled esophagus is noted. Lungs/Pleura: No pneumothorax or pleural effusion is noted. Patchy airspace opacities are noted throughout the upper and lower lobes bilaterally most consistent with  multifocal pneumonia. Upper Abdomen: Moderate to severe gastric distention is noted. Musculoskeletal: No chest wall abnormality. No acute or significant osseous findings. Review of the MIP images confirms the above findings.  IMPRESSION: No definite evidence of pulmonary embolus. Patchy airspace opacities are noted throughout both lungs most consistent with multifocal pneumonia. Severely dilated and fluid-filled esophagus is noted as well as moderate to severe gastric distention seen in visualized portion of upper abdomen. This is concerning for gastric outlet obstruction. Coronary artery calcifications are noted suggesting coronary artery disease. Aortic Atherosclerosis (ICD10-I70.0). Electronically Signed   By: Lupita Raider M.D.   On: 02/03/2023 19:03               LOS: 3 days      [facility]   Pager on [email or web address]. If 7PM-7AM, please contact night-coverage at [email or web address]     02/04/2023, 4:41 PM

## 2023-02-04 NOTE — Telephone Encounter (Signed)
Pharmacy Patient Advocate Encounter  Insurance verification completed.    The patient is insured through [employer] Part D   The patient is currently admitted and ran test claims for the following: Eliquis .  Copays and coinsurance results were relayed to Inpatient clinical team.      

## 2023-02-04 NOTE — Progress Notes (Signed)
Subjective:  POD #2 s/p percutaneous screw fixation of left femoral neck hip fracture.  Patient was diagnosed with an ileus by KUB today.  She had an NG tube placed by Dr. Everlene Farrier from general surgery after x-rays revealed dilated small loops of bowel concerning for possible bowel obstruction or ileus, patient has had significant output from her G-tube.  Her daughter is at the bedside.  The patient is sleeping comfortably.  Objective:   VITALS:   Vitals:   02/04/23 1400 02/04/23 1600 02/04/23 1700 02/04/23 1800  BP: (!) 167/62     Pulse: 95 93 97 96  Resp: (!) 25 19 (!) 24 (!) 23  Temp:      TempSrc:      SpO2: 90% 96% 96% 95%  Weight:      Height:        PHYSICAL EXAM: Left lower extremity: Full examination was deferred due to the patient sleeping comfortably Incision: dressing C/D/I  LABS  Results for orders placed or performed during the hospital encounter of 02/01/23 (from the past 24 hour(s))  Glucose, capillary     Status: Abnormal   Collection Time: 02/03/23  9:21 PM  Result Value Ref Range   Glucose-Capillary 136 (H) 70 - 99 mg/dL  Troponin I (High Sensitivity)     Status: None   Collection Time: 02/03/23 11:29 PM  Result Value Ref Range   Troponin I (High Sensitivity) 17 <18 ng/L  CBC     Status: Abnormal   Collection Time: 02/04/23  4:11 AM  Result Value Ref Range   WBC 13.8 (H) 4.0 - 10.5 K/uL   RBC 3.93 3.87 - 5.11 MIL/uL   Hemoglobin 11.4 (L) 12.0 - 15.0 g/dL   HCT 54.0 (L) 98.1 - 19.1 %   MCV 87.0 80.0 - 100.0 fL   MCH 29.0 26.0 - 34.0 pg   MCHC 33.3 30.0 - 36.0 g/dL   RDW 47.8 29.5 - 62.1 %   Platelets 186 150 - 400 K/uL   nRBC 0.0 0.0 - 0.2 %  Basic metabolic panel     Status: Abnormal   Collection Time: 02/04/23  4:11 AM  Result Value Ref Range   Sodium 133 (L) 135 - 145 mmol/L   Potassium 3.8 3.5 - 5.1 mmol/L   Chloride 104 98 - 111 mmol/L   CO2 22 22 - 32 mmol/L   Glucose, Bld 130 (H) 70 - 99 mg/dL   BUN 23 8 - 23 mg/dL   Creatinine, Ser 3.08  0.44 - 1.00 mg/dL   Calcium 8.0 (L) 8.9 - 10.3 mg/dL   GFR, Estimated >65 >78 mL/min   Anion gap 7 5 - 15  Heparin level (unfractionated)     Status: Abnormal   Collection Time: 02/04/23  4:11 AM  Result Value Ref Range   Heparin Unfractionated <0.10 (L) 0.30 - 0.70 IU/mL  Troponin I (High Sensitivity)     Status: Abnormal   Collection Time: 02/04/23  4:11 AM  Result Value Ref Range   Troponin I (High Sensitivity) 18 (H) <18 ng/L  Heparin level (unfractionated)     Status: Abnormal   Collection Time: 02/04/23  1:42 PM  Result Value Ref Range   Heparin Unfractionated <0.10 (L) 0.30 - 0.70 IU/mL    DG ABD ACUTE 2+V W 1V CHEST  Result Date: 02/04/2023 CLINICAL DATA:  Gastric outlet obstruction. EXAM: DG ABDOMEN ACUTE WITH 1 VIEW CHEST COMPARISON:  Feb 01, 2023. FINDINGS: Mildly dilated small bowel loops are noted  concerning for distal small bowel obstruction or ileus. Residual contrast is noted in urinary bladder. No radiopaque calculi or other significant radiographic abnormality is seen. Heart size and mediastinal contours are within normal limits. Mild bilateral perihilar opacities are noted concerning for pulmonary edema or inflammation, right greater than left. IMPRESSION: Dilated small bowel loops are noted concerning for distal small bowel obstruction or ileus. Bilateral lung opacities are noted, right greater than left, concerning for pulmonary edema or inflammation. Electronically Signed   By: Lupita Raider M.D.   On: 02/04/2023 12:55   ECHOCARDIOGRAM COMPLETE  Result Date: 02/04/2023    ECHOCARDIOGRAM REPORT   Patient Name:   [name] PINKERMAN [facility] Date of Exam: 02/03/2023 Medical Rec #:  [id number]          Height:       64.0 in Accession #:    [id number]         Weight:       142.0 lb Date of Birth:  [birth year]          BSA:          1.691 m Patient Age:    82 years           BP:           159/63 mmHg Patient Gender: F                  HR:           115 bpm. Exam Location:  [facility] Procedure:  2D Echo, Cardiac Doppler and Color Doppler Indications:     R07.9 Chest pain  History:         Patient has prior history of Echocardiogram examinations, most                  recent 11/21/2021.  Sonographer:     Daphine Deutscher RDCS Referring Phys:  [id number] CHRISTOPHER END Diagnosing Phys: Yvonne Kendall MD IMPRESSIONS  1. Left ventricular ejection fraction, by estimation, is 65 to 70%. The left ventricle has normal function. The left ventricle has no regional wall motion abnormalities. There is mild left ventricular hypertrophy. Left ventricular diastolic parameters are consistent with Grade I diastolic dysfunction (impaired relaxation). Elevated left atrial pressure.  2. Right ventricular systolic function is mildly reduced. The right ventricular size is normal. There is normal pulmonary artery systolic pressure.  3. The mitral valve is normal in structure. Trivial mitral valve regurgitation. No evidence of mitral stenosis.  4. Tricuspid valve regurgitation is mild to moderate.  5. The aortic valve has an indeterminant number of cusps. There is mild calcification of the aortic valve. There is mild thickening of the aortic valve. Aortic valve regurgitation is not visualized. Aortic valve sclerosis/calcification is present, without any evidence of aortic stenosis.  6. The inferior vena cava is normal in size with <50% respiratory variability, suggesting right atrial pressure of 8 mmHg. FINDINGS  Left Ventricle: Left ventricular ejection fraction, by estimation, is 65 to 70%. The left ventricle has normal function. The left ventricle has no regional wall motion abnormalities. The left ventricular internal cavity size was normal in size. There is  mild left ventricular hypertrophy. Left ventricular diastolic parameters are consistent with Grade I diastolic dysfunction (impaired relaxation). Elevated left atrial pressure. Right Ventricle: The right ventricular size is normal. No increase in right ventricular wall  thickness. Right ventricular systolic function is mildly reduced. There is normal pulmonary artery systolic pressure. The tricuspid regurgitant velocity is 2.50 m/s, and  with an assumed right atrial pressure of 8 mmHg, the estimated right ventricular systolic pressure is 33.0 mmHg. Left Atrium: Left atrial size was normal in size. Right Atrium: Right atrial size was normal in size. Pericardium: There is no evidence of pericardial effusion. Mitral Valve: The mitral valve is normal in structure. Trivial mitral valve regurgitation. No evidence of mitral valve stenosis. Tricuspid Valve: The tricuspid valve is normal in structure. Tricuspid valve regurgitation is mild to moderate. Aortic Valve: The aortic valve has an indeterminant number of cusps. There is mild calcification of the aortic valve. There is mild thickening of the aortic valve. Aortic valve regurgitation is not visualized. Aortic valve sclerosis/calcification is present, without any evidence of aortic stenosis. Aortic valve mean gradient measures 9.0 mmHg. Aortic valve peak gradient measures 11.4 mmHg. Aortic valve area, by VTI measures 2.05 cm. Pulmonic Valve: The pulmonic valve was normal in structure. Pulmonic valve regurgitation is trivial. No evidence of pulmonic stenosis. Aorta: The aortic root is normal in size and structure. Pulmonary Artery: The pulmonary artery is of normal size. Venous: The inferior vena cava is normal in size with less than 50% respiratory variability, suggesting right atrial pressure of 8 mmHg. IAS/Shunts: The interatrial septum was not well visualized.  LEFT VENTRICLE PLAX 2D LVIDd:         3.40 cm   Diastology LVIDs:         2.30 cm   LV e' medial:    6.20 cm/s LV PW:         1.20 cm   LV E/e' medial:  17.3 LV IVS:        1.20 cm   LV e' lateral:   6.05 cm/s LVOT diam:     1.90 cm   LV E/e' lateral: 17.7 LV SV:         53 LV SV Index:   31 LVOT Area:     2.84 cm  RIGHT VENTRICLE             IVC RV Basal diam:  3.30 cm      IVC diam: 1.60 cm RV S prime:     10.22 cm/s LEFT ATRIUM             Index        RIGHT ATRIUM           Index LA diam:        4.20 cm 2.48 cm/m   RA Area:     10.90 cm LA Vol (A2C):   41.3 ml 24.42 ml/m  RA Volume:   25.70 ml  15.19 ml/m LA Vol (A4C):   36.5 ml 21.58 ml/m LA Biplane Vol: 39.2 ml 23.18 ml/m  AORTIC VALVE AV Area (Vmax):    1.83 cm AV Area (Vmean):   1.80 cm AV Area (VTI):     2.05 cm AV Vmax:           168.78 cm/s AV Vmean:          118.474 cm/s AV VTI:            0.259 m AV Peak Grad:      11.4 mmHg AV Mean Grad:      9.0 mmHg LVOT Vmax:         108.75 cm/s LVOT Vmean:        75.350 cm/s LVOT VTI:          0.188 m LVOT/AV VTI ratio: 0.72  AORTA Ao Root diam: 3.30 cm  Ao Asc diam:  2.90 cm MITRAL VALVE                TRICUSPID VALVE MV Area (PHT): 2.93 cm     TR Peak grad:   25.0 mmHg MV Decel Time: 259 msec     TR Vmax:        250.00 cm/s MV E velocity: 107.14 cm/s MV A velocity: 144.50 cm/s  SHUNTS MV E/A ratio:  0.74         Systemic VTI:  0.19 m                             Systemic Diam: 1.90 cm Yvonne Kendall MD Electronically signed by Yvonne Kendall MD Signature Date/Time: 02/04/2023/7:35:40 AM    Final    CT Angio Chest Pulmonary Embolism (PE) W or WO Contrast  Result Date: 02/03/2023 CLINICAL DATA:  High probability of pulmonary embolus. EXAM: CT ANGIOGRAPHY CHEST WITH CONTRAST TECHNIQUE: Multidetector CT imaging of the chest was performed using the standard protocol during bolus administration of intravenous contrast. Multiplanar CT image reconstructions and MIPs were obtained to evaluate the vascular anatomy. RADIATION DOSE REDUCTION: This exam was performed according to the departmental dose-optimization program which includes automated exposure control, adjustment of the mA and/or kV according to patient size and/or use of iterative reconstruction technique. CONTRAST:  60mL OMNIPAQUE IOHEXOL 350 MG/ML SOLN COMPARISON:  None Available. FINDINGS: Cardiovascular: Satisfactory  opacification of the pulmonary arteries to the segmental level. No evidence of pulmonary embolism. Normal heart size. No pericardial effusion. Coronary artery calcifications are noted. Mediastinum/Nodes: Thyroid gland is unremarkable. No adenopathy is noted. Severely dilated and fluid-filled esophagus is noted. Lungs/Pleura: No pneumothorax or pleural effusion is noted. Patchy airspace opacities are noted throughout the upper and lower lobes bilaterally most consistent with multifocal pneumonia. Upper Abdomen: Moderate to severe gastric distention is noted. Musculoskeletal: No chest wall abnormality. No acute or significant osseous findings. Review of the MIP images confirms the above findings. IMPRESSION: No definite evidence of pulmonary embolus. Patchy airspace opacities are noted throughout both lungs most consistent with multifocal pneumonia. Severely dilated and fluid-filled esophagus is noted as well as moderate to severe gastric distention seen in visualized portion of upper abdomen. This is concerning for gastric outlet obstruction. Coronary artery calcifications are noted suggesting coronary artery disease. Aortic Atherosclerosis (ICD10-I70.0). Electronically Signed   By: Lupita Raider M.D.   On: 02/03/2023 19:03    Assessment/Plan: 2 Days Post-Op   Principal Problem:   Closed left hip fracture, initial encounter (HCC) Active Problems:   Hypercholesterolemia   Hypertensive urgency   Prolonged QT interval   Subcapital fracture of hip, left, closed, initial encounter (HCC)   PAF (paroxysmal atrial fibrillation) (HCC)   Ileus (HCC)   Ileus, postoperative (HCC)   Multifocal pneumonia  Patient is stable from an orthopedic standpoint.  Her major issue now is a postoperative ileus.  Patient has an NG tube in place and will continue to be followed by general surgery.  Patient will reinitiate physical therapy once medically appropriate.  Patient's hemoglobin and hematocrit remain stable postop.   Patient currently on heparin for DVT prophylaxis.    Juanell Fairly , MD 02/04/2023, 7:17 PM

## 2023-02-04 NOTE — Plan of Care (Signed)
  Problem: Health Behavior/Discharge Planning: Goal: Ability to manage health-related needs will improve Outcome: Progressing   Problem: Clinical Measurements: Goal: Respiratory complications will improve Outcome: Progressing   Problem: Clinical Measurements: Goal: Cardiovascular complication will be avoided Outcome: Progressing   Problem: Pain Managment: Goal: General experience of comfort will improve Outcome: Progressing   Problem: Safety: Goal: Ability to remain free from injury will improve Outcome: Progressing   

## 2023-02-04 NOTE — TOC Benefit Eligibility Note (Signed)
Patient Advocate Encounter  Insurance verification completed.    The patient is currently admitted and upon discharge could be taking Eliquis 5 mg.  The current 30 day co-pay is $40.00.   The patient is insured through [employer] Part D   This test claim was processed through [facility]- copay amounts may vary at other pharmacies due to pharmacy/plan contracts, or as the patient moves through the different stages of their insurance plan.   , [facility] Pharm[facility] Patient Advocate Specialist [facility] Patient Advocate Team Direct Number: [phone]  Fax: [phone]       

## 2023-02-04 NOTE — Consult Note (Signed)
ANTICOAGULATION CONSULT NOTE - Initial Consult  Pharmacy Consult for heparin infusion Indication: chest pain/ACS  Allergies  Allergen Reactions   Augmentin [Amoxicillin-Pot Clavulanate] Swelling and Rash    Swelling of the lips   Lexapro [Escitalopram Oxalate]    Micardis [Telmisartan] Itching   Myrbetriq [Mirabegron] Itching   Niacin And Related Itching   Codeine Sulfate Other (See Comments)    "Makes her hyper"   Vesicare [Solifenacin Succinate] Nausea And Vomiting and Rash    Patient Measurements: Height: 5\' 4"  (162.6 cm) Weight: 71.4 kg (157 lb 6.5 oz) (verfied by Katie T, NT. 1 sheet, 1 blanket, 1 chux pad, 1 pillow) IBW/kg (Calculated) : 54.7 Heparin Dosing Weight: 64.4 kg   Vital Signs: Temp: 98.1 F (36.7 C) (05/08 0501) Temp Source: Oral (05/08 0501) BP: 130/48 (05/08 0700) Pulse Rate: 75 (05/08 0700)  Labs: Recent Labs    02/03/23 0519 02/03/23 1747 02/03/23 2329 02/04/23 0411 02/04/23 1342  HGB 12.1 12.5  --  11.4*  --   HCT 37.0 37.5  --  34.2*  --   PLT 192 198  --  186  --   HEPARINUNFRC  --   --   --  <0.10* <0.10*  CREATININE 1.35* 1.11*  --  0.82  --   TROPONINIHS  --  15 17 18*  --      Estimated Creatinine Clearance: 50.4 mL/min (by C-G formula based on SCr of 0.82 mg/dL).   Medical History: Past Medical History:  Diagnosis Date   AK (actinic keratosis) 11/12/2022   left upper arm, tx'd with EDC   AK (actinic keratosis) 11/12/2022   right medial pretibia, LN2 11/25/22   Basal cell carcinoma 06/21/2020   left post shoulder sup, left mid chest, left lat thigh   Basal cell carcinoma 11/07/2021   L upper back, EDC   Basal cell carcinoma 11/07/2021   R upper back, EDC   Basal cell carcinoma 11/27/2021   right upper arm, EDC   Basal cell carcinoma 11/27/2021   right shoulder posterior, EDC   Basal cell carcinoma 11/27/2021   right anterior shoulder, EDC   Basal cell carcinoma (BCC) 06/13/2021   left dorsal foot, EDC 10/02/2021   Basal  cell carcinoma (BCC) 06/13/2021   right postauricular tx'd w/ EDC   BCC (basal cell carcinoma of skin) 03/30/2007   R inf med pretibial - BCC   BCC (basal cell carcinoma of skin) 10/12/2013   L nasal ala - BCC   BCC (basal cell carcinoma of skin) 03/10/2018   L mid dorsum med forearm - superficial BCC    BCC (basal cell carcinoma) 10/02/2021   left dorsal foot proximal, EDC 10/02/2021   BCC (basal cell carcinoma) 10/02/2021   left mid back, superficial EDC 11/07/21   BCC (basal cell carcinoma) 10/02/2021   right pretibia   BCC (basal cell carcinoma) 10/02/2021   right mid back, superficial EDC 11/07/21   BCC (basal cell carcinoma) 08/14/2022   left distal calf, tx'd with EDC   BCC (basal cell carcinoma) 11/12/2022   superficial at left lateral pretibial,l tx'd with EDC   BCC (basal cell carcinoma) 11/12/2022   superficial at mid back right of midline, scheduled for EDC   BCC (basal cell carcinoma) 11/12/2022   mid back right of midline, EDC 11/25/22   Breast screening, unspecified 2013   Cancer (HCC) 1992   skin   Degenerative disk disease    GERD (gastroesophageal reflux disease)    History of basal  cell carcinoma (BCC) 08/29/2020    left inferior knee lateral, , left inferior knee medial, right pretibia   History of SCC (squamous cell carcinoma) of skin 08/29/2020   right posterior calf, left lateral calf, and    Hypercholesterolemia 2008   Interstitial cystitis    followed by Dr Achilles Dunk   Other sign and symptom in breast 2013   Left upper outer quadrant breast "soreness" Ultrasound exam of right breast in the 2 o'clock position with the breast distracted medially showed a 0.3-0.4 with 0.5 cm simple cyst. In the 1 o'clock position where pt reported tenderness US exam was negatiive.  Because of her history of intermittent nipple drainage, ultrasound was completed of the retroareolar area.   Personal history of tobacco use, presenting hazards to health    PONV (postoperative nausea and  vomiting)    SCC (squamous cell carcinoma) 03/28/2008   R dorsum hand - SCC   SCC (squamous cell carcinoma) 05/09/2009   R lat lower leg - SCCIS   SCC (squamous cell carcinoma) 05/09/2009   L lat lower leg - SCCIS   SCC (squamous cell carcinoma) 04/07/2013   L lower leg - SCCIS   SCC (squamous cell carcinoma) 04/27/2013   R forearm - SCC   SCC (squamous cell carcinoma) 04/28/2017   L lat knee - SCCIS   SCC (squamous cell carcinoma) 05/12/2018   L prox dorsum forearm - SCC   SCC (squamous cell carcinoma) 06/13/2021   left forearm tx'd w/ EDC   SCC (squamous cell carcinoma), leg, right 03/30/2007   R sup pretibial - SCCIS   SCC (squamous cell carcinoma);BCC 10/12/2013   Mid back - superficial BCC with SCCIS    Special screening for malignant neoplasms, colon    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   left dorsal forearm, left lat calf   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 Last dose 5/7 @ 0900  Assessment: 82 y.o. female with a hx of hypertension, hyperlipidemia, aortic atherosclerosis, syncope, and recurrent falls, admitted 02/01/23 with left hip fracture 2/2 mechanical fall. Post-Op Day 1 Left femoral neck fracture fixation on 02/02/23. On 02/03/23 patient with new onset chest pain and abnormal EKG. Pharmacy has been consulted to initiate and manage IV heparin therapy.  Initial troponin 15.  Goal of Therapy:  Heparin level 0.3-0.7 units/ml Monitor platelets by anticoagulation protocol: Yes   5/8: HL @ 0411 = < 0.1, SUBtherapeutic 5/9: HL @ 1342 = < 0.1, SUBtherapeutic  Plan: Very diminished response to heparin increases so far. If continuing to remain below detectable limit, suspicion for ATIII deficiency. Will hit 48 hrs of UFH on 5/9 @ 2000. Give 2000 units bolus x1; then  increase heparin infusion to 1300 units/hr Check aPTT/Anti-Xa level in 8 hours and daily once consecutively therapeutic.  Titrate by aPTT's until lab correlation is noted, then titrate by anti-xa alone. Continue to monitor H&H and platelets daily while on heparin gtt.  Orson Aloe, PharmD Clinical Pharmacist   02/04/2023,4:20 PM

## 2023-02-04 NOTE — Consult Note (Signed)
[name], female   DOB: [birth year], 82 y.o.   MRN: [id number]  HPI Rachael Austin is a 82 y.o. female in in consultation at the request of Dr. Dr. Myriam Forehand for possible gastric outlet obstruction.  She did suffer a fall 3 days ago resulted in an impacted femoral neck hip fracture did underwent percutaneous fixation by Dr. Martha Clan 2 days ago.  Currently after developed indigestion abdominal pain, chest pressure reflux.  Also had an hypertensive urgency.  She underwent emergent CTA that I have personally reviewed showing no evidence of PE but evidence of dilated stomach concerning for gastric outlet obstruction.  There was no free air.  There was no evidence of specific mass.  She also had an EKG showing new q waves  echo performed showing  and a fib . Rapid response was called and she was placed on tele. An NG tube was also attempted and apparently she pulled it out. Has been started on a heparin drip BMP is normal and CBC shows mild elevation of the white count She is very independent at baseline she is able to drive her own car pay bills she lives independently.  She does have some memory issues that are mild.  She is able to control her sphincters.   She does have Remote hx of Open appendectomy and  cholecystectomy, hysterectomy.  HPI  Past Medical History:  Diagnosis Date   AK (actinic keratosis) 11/12/2022   left upper arm, tx'd with EDC   AK (actinic keratosis) 11/12/2022   right medial pretibia, LN2 11/25/22   Basal cell carcinoma 06/21/2020   left post shoulder sup, left mid chest, left lat thigh   Basal cell carcinoma 11/07/2021   L upper back, EDC   Basal cell carcinoma 11/07/2021   R upper back, EDC   Basal cell carcinoma 11/27/2021   right upper arm, EDC   Basal cell carcinoma 11/27/2021   right shoulder posterior, EDC   Basal cell carcinoma 11/27/2021   right anterior shoulder, EDC   Basal cell carcinoma (BCC) 06/13/2021   left dorsal foot, EDC  10/02/2021   Basal cell carcinoma (BCC) 06/13/2021   right postauricular tx'd w/ EDC   BCC (basal cell carcinoma of skin) 03/30/2007   R inf med pretibial - BCC   BCC (basal cell carcinoma of skin) 10/12/2013   L nasal ala - BCC   BCC (basal cell carcinoma of skin) 03/10/2018   L mid dorsum med forearm - superficial BCC    BCC (basal cell carcinoma) 10/02/2021   left dorsal foot proximal, EDC 10/02/2021   BCC (basal cell carcinoma) 10/02/2021   left mid back, superficial EDC 11/07/21   BCC (basal cell carcinoma) 10/02/2021   right pretibia   BCC (basal cell carcinoma) 10/02/2021   right mid back, superficial EDC 11/07/21   BCC (basal cell carcinoma) 08/14/2022   left distal calf, tx'd with EDC   BCC (basal cell carcinoma) 11/12/2022   superficial at left lateral pretibial,l tx'd with EDC   BCC (basal cell carcinoma) 11/12/2022   superficial at mid back right of midline, scheduled for EDC   BCC (basal cell carcinoma) 11/12/2022   mid back right of midline, [facility] 11/25/22   Breast screening, unspecified 2013   Cancer (HCC) 1992   skin   Degenerative disk disease    GERD (gastroesophageal reflux disease)    History of basal cell carcinoma (BCC) 08/29/2020    left inferior knee lateral, , left inferior knee  medial, right pretibia   History of SCC (squamous cell carcinoma) of skin 08/29/2020   right posterior calf, left lateral calf, and    Hypercholesterolemia 2008   Interstitial cystitis    followed by Dr Achilles Dunk   Other sign and symptom in breast 2013   Left upper outer quadrant breast "soreness" Ultrasound exam of right breast in the 2 o'clock position with the breast distracted medially showed a 0.3-0.4 with 0.5 cm simple cyst. In the 1 o'clock position where pt reported tenderness US exam was negatiive.  Because of her history of intermittent nipple drainage, ultrasound was completed of the retroareolar area.   Personal history of tobacco use, presenting hazards to health    PONV  (postoperative nausea and vomiting)    SCC (squamous cell carcinoma) 03/28/2008   R dorsum hand - SCC   SCC (squamous cell carcinoma) 05/09/2009   R lat lower leg - SCCIS   SCC (squamous cell carcinoma) 05/09/2009   L lat lower leg - SCCIS   SCC (squamous cell carcinoma) 04/07/2013   L lower leg - SCCIS   SCC (squamous cell carcinoma) 04/27/2013   R forearm - SCC   SCC (squamous cell carcinoma) 04/28/2017   L lat knee - SCCIS   SCC (squamous cell carcinoma) 05/12/2018   L prox dorsum forearm - SCC   SCC (squamous cell carcinoma) 06/13/2021   left forearm tx'd w/ EDC   SCC (squamous cell carcinoma), leg, right 03/30/2007   R sup pretibial - SCCIS   SCC (squamous cell carcinoma);BCC 10/12/2013   Mid back - superficial BCC with SCCIS    Special screening for malignant neoplasms, colon    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   left dorsal forearm, left lat calf   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   left lower leg superior, EDC at follow up   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   chest right of midline, tx'd with [facility]    Past Surgical History:  Procedure Laterality Date   ABDOMINAL HYSTERECTOMY  1992   APPENDECTOMY     CERVICAL DISCECTOMY     S/P C7-T1 discectomy with fusion   CHOLECYSTECTOMY  1990   COLONOSCOPY WITH PROPOFOL N/A 02/14/2016   Procedure: COLONOSCOPY WITH PROPOFOL;  Surgeon: Midge Minium, MD;  Location: [facility];  Service: Endoscopy;  Laterality: N/A;  PT WOULD LIKE 10 ARRIVAL TIME OR LATER   DILATION AND CURETTAGE OF UTERUS     ESOPHAGOGASTRODUODENOSCOPY (EGD) WITH PROPOFOL N/A 02/14/2016   Procedure: ESOPHAGOGASTRODUODENOSCOPY (EGD) WITH PROPOFOL with dialtion;  Surgeon: Midge Minium, MD;  Location: [facility];  Service: Endoscopy;  Laterality: N/A;   ESOPHAGOGASTRODUODENOSCOPY (EGD) WITH PROPOFOL N/A 04/13/2019   Procedure: ESOPHAGOGASTRODUODENOSCOPY (EGD) WITH PROPOFOL;  Surgeon: Toledo, Boykin Nearing, MD;   Location: [facility];  Service: Gastroenterology;  Laterality: N/A;   HIP PINNING,CANNULATED Left 02/02/2023   Procedure: PERCUTANEOUS FIXATION OF FEMORAL NECK;  Surgeon: Juanell Fairly, MD;  Location: [facility];  Service: Orthopedics;  Laterality: Left;   MELANOMA EXCISION     removed from Left calf 1994    Family History  Problem Relation Age of Onset   Hodgkin's lymphoma Mother    Heart failure Father    Heart attack Father    Arthritis Sister        Three sisters w/ degeneratve disk disease   Headache Sister        Two sisters hx of headache   Breast cancer Neg Hx  Colon cancer Neg Hx    Bladder Cancer Neg Hx    Kidney cancer Neg Hx     Social History Social History   Tobacco Use   Smoking status: Former    Packs/day: 1.00    Years: 15.00    Additional pack years: 0.00    Total pack years: 15.00    Types: Cigarettes   Smokeless tobacco: Never  Vaping Use   Vaping Use: Never used  Substance Use Topics   Alcohol use: No    Alcohol/week: 0.0 standard drinks of alcohol   Drug use: No    Allergies  Allergen Reactions   Augmentin [Amoxicillin-Pot Clavulanate] Swelling and Rash    Swelling of the lips   Lexapro [Escitalopram Oxalate]    Micardis [Telmisartan] Itching   Myrbetriq [Mirabegron] Itching   Niacin And Related Itching   Codeine Sulfate Other (See Comments)    "Makes her hyper"   Vesicare [Solifenacin Succinate] Nausea And Vomiting and Rash    Current Facility-Administered Medications  Medication Dose Route Frequency Provider Last Rate Last Admin   acetaminophen (TYLENOL) tablet 325-650 mg  325-650 mg Oral Q6H PRN Juanell Fairly, MD       alum & mag hydroxide-simeth (MAALOX/MYLANTA) 200-200-20 MG/5ML suspension 30 mL  30 mL Oral Q4H PRN Juanell Fairly, MD   30 mL at 02/03/23 0925   bisacodyl (DULCOLAX) suppository 10 mg  10 mg Rectal Daily PRN Juanell Fairly, MD       cefTRIAXone (ROCEPHIN) 1 g in sodium chloride 0.9 % 100 mL IVPB  1 g  Intravenous Q24H Foust, Katy L, NP   Stopped at 02/04/23 0040   diltiazem (CARDIZEM) 125 mg in dextrose 5% 125 mL (1 mg/mL) infusion  5-15 mg/hr Intravenous Titrated Foust, Katy L, NP 7.5 mL/hr at 02/04/23 1348 7.5 mg/hr at 02/04/23 1348   docusate sodium (COLACE) capsule 100 mg  100 mg Oral BID Juanell Fairly, MD   100 mg at 02/02/23 2213   doxycycline (VIBRAMYCIN) 100 mg in sodium chloride 0.9 % 250 mL IVPB  100 mg Intravenous BID Lurene Shadow, MD 125 mL/hr at 02/04/23 1358 100 mg at 02/04/23 1358   feeding supplement (ENSURE ENLIVE / ENSURE PLUS) liquid 237 mL  237 mL Oral BID BM Leandro Reasoner Tublu, MD   237 mL at 02/03/23 1430   heparin ADULT infusion 100 units/mL (25000 units/258mL)  1,000 Units/hr Intravenous Continuous Leandro Reasoner Tublu, MD 10 mL/hr at 02/04/23 0625 1,000 Units/hr at 02/04/23 1610   hydrALAZINE (APRESOLINE) tablet 25 mg  25 mg Oral BID Juanell Fairly, MD   25 mg at 02/03/23 0900   hydrALAZINE (APRESOLINE) tablet 25 mg  25 mg Oral Q6H PRN Juanell Fairly, MD       HYDROcodone-acetaminophen (NORCO/VICODIN) 5-325 MG per tablet 1-2 tablet  1-2 tablet Oral Q4H PRN Juanell Fairly, MD   1 tablet at 02/02/23 2319   labetalol (NORMODYNE) injection 10 mg  10 mg Intravenous Q2H PRN Pieter Partridge, MD   10 mg at 02/04/23 9604   menthol-cetylpyridinium (CEPACOL) lozenge 3 mg  1 lozenge Oral PRN Juanell Fairly, MD       Or   phenol (CHLORASEPTIC) mouth spray 1 spray  1 spray Mouth/Throat PRN Juanell Fairly, MD       methocarbamol (ROBAXIN) tablet 500 mg  500 mg Oral Q6H PRN Juanell Fairly, MD       Or   methocarbamol (ROBAXIN) 500 mg in dextrose 5 % 50 mL IVPB  500 mg  Intravenous Q6H PRN Juanell Fairly, MD       morphine (PF) 2 MG/ML injection 0.5-1 mg  0.5-1 mg Intravenous Q2H PRN Juanell Fairly, MD   1 mg at 02/04/23 8295   multivitamin with minerals tablet 1 tablet  1 tablet Oral Daily Leandro Reasoner Tublu, MD       pantoprazole  (PROTONIX) injection 40 mg  40 mg Intravenous Q12H Leandro Reasoner Tublu, MD   40 mg at 02/04/23 0923   polyethylene glycol (MIRALAX / GLYCOLAX) packet 17 g  17 g Oral Daily PRN Juanell Fairly, MD   17 g at 02/03/23 0931   pravastatin (PRAVACHOL) tablet 20 mg  20 mg Oral q1800 Juanell Fairly, MD       prochlorperazine (COMPAZINE) injection 5 mg  5 mg Intravenous Q4H PRN Juanell Fairly, MD   5 mg at 02/03/23 1724   senna (SENOKOT) tablet 8.6 mg  1 tablet Oral BID Juanell Fairly, MD   8.6 mg at 02/02/23 2213     Review of Systems Full ROS  was asked and was negative except for the information on the HPI  Physical Exam Blood pressure (!) 130/48, pulse 75, temperature 98.1 F (36.7 C), temperature source Oral, resp. rate 20, height 5\' 4"  (1.626 m), weight 71.4 kg, last menstrual period 11/20/1976, SpO2 95 %. CONSTITUTIONAL: debilitated . EYES: Pupils are equal, round, Sclera are non-icteric. EARS, NOSE, MOUTH AND THROAT: . The oral mucosa is pink and moist. Hearing is intact to voice. LYMPH NODES:  Lymph nodes in the neck are normal. RESPIRATORY:  Lungs are clear. There is normal respiratory effort, with equal breath sounds bilaterally, and without pathologic use of accessory muscles. CARDIOVASCULAR: Heart is regular without murmurs, gallops, or rubs. GI: The abdomen is  soft, but distended mild diffuse tenderness w/o rebound ,  nontender, and nondistended. There are no palpable masses. There is no hepatosplenomegaly.  RUQ scar. Personally placed an NG tube without complications and immediate 600 cc of dark brown output was noted GU: Rectal deferred.   MUSCULOSKELETAL:    NEUROLOGIC: Motor and sensation is grossly normal. Cranial nerves are grossly intact. PSYCH:  Oriented to person, place and time. Affect is normal.  Data Reviewed  I have personally reviewed the patient's imaging, laboratory findings and medical records.    Assessment/Plan 82 year old female with multiple  medical problems status post fall and hip fracture with significant cardiac issue now with postoperative ileus.  I have personally placed an NG tube myself.  We will keep that until her bowel function improves.  I do not think that she has gastric outlet obstruction.  We will continue to follow as she may require further workup depending on clinical findings.  This point no need for any emergent surgical intervention.  We will continue NG tube serial abdominal exams and serial x-rays and we will continue to follow.  Please note that I spent greater than 75 minutes in this encounter including personally reviewing imaging studies, coordinating her care, placing orders, counseling the family extensively  Sterling Big, MD FACS General Surgeon 02/04/2023, 2:35 PM

## 2023-02-04 NOTE — Progress Notes (Signed)
PT Cancellation Note  Patient Details Name: Rachael Austin MRN: [id number] DOB: 1941/04/06   Cancelled Treatment:    Reason Eval/Treat Not Completed: Medical issues which prohibited therapy. Pt noted for transfer to 2A for a change in medical status, please re-consult PT when pt is medically appropriate.   Olga Coaster PT, DPT 9:03 AM,02/04/23

## 2023-02-05 ENCOUNTER — Inpatient Hospital Stay: Payer: Medicare PPO

## 2023-02-05 DIAGNOSIS — I160 Hypertensive urgency: Secondary | ICD-10-CM | POA: Diagnosis not present

## 2023-02-05 DIAGNOSIS — J9601 Acute respiratory failure with hypoxia: Secondary | ICD-10-CM | POA: Diagnosis not present

## 2023-02-05 DIAGNOSIS — J189 Pneumonia, unspecified organism: Secondary | ICD-10-CM | POA: Diagnosis not present

## 2023-02-05 DIAGNOSIS — I480 Paroxysmal atrial fibrillation: Secondary | ICD-10-CM | POA: Diagnosis not present

## 2023-02-05 DIAGNOSIS — K567 Ileus, unspecified: Secondary | ICD-10-CM | POA: Diagnosis not present

## 2023-02-05 DIAGNOSIS — K9189 Other postprocedural complications and disorders of digestive system: Secondary | ICD-10-CM | POA: Diagnosis not present

## 2023-02-05 DIAGNOSIS — S72002A Fracture of unspecified part of neck of left femur, initial encounter for closed fracture: Secondary | ICD-10-CM | POA: Diagnosis not present

## 2023-02-05 DIAGNOSIS — S72012A Unspecified intracapsular fracture of left femur, initial encounter for closed fracture: Secondary | ICD-10-CM | POA: Diagnosis not present

## 2023-02-05 LAB — HEPARIN LEVEL (UNFRACTIONATED)
Heparin Unfractionated: 0.24 IU/mL — ABNORMAL LOW (ref 0.30–0.70)
Heparin Unfractionated: 0.39 IU/mL (ref 0.30–0.70)
Heparin Unfractionated: 0.37 IU/mL (ref 0.30–0.70)

## 2023-02-05 LAB — BASIC METABOLIC PANEL
Anion gap: 8 (ref 5–15)
BUN: 15 mg/dL (ref 8–23)
CO2: 22 mmol/L (ref 22–32)
Calcium: 7.7 mg/dL — ABNORMAL LOW (ref 8.9–10.3)
Chloride: 104 mmol/L (ref 98–111)
Creatinine, Ser: 0.65 mg/dL (ref 0.44–1.00)
GFR, Estimated: >60 mL/min (ref >60)
Glucose, Bld: 120 mg/dL — ABNORMAL HIGH (ref 70–99)
Potassium: 3.5 mmol/L (ref 3.5–5.1)
Sodium: 134 mmol/L — ABNORMAL LOW (ref 135–145)

## 2023-02-05 LAB — CBC
HCT: 30.8 % — ABNORMAL LOW (ref 36.0–46.0)
Hemoglobin: 10.4 g/dL — ABNORMAL LOW (ref 12.0–15.0)
MCH: 28.9 pg (ref 26.0–34.0)
MCHC: 33.8 g/dL (ref 30.0–36.0)
MCV: 85.6 fL (ref 80.0–100.0)
Platelets: 180 10*3/uL (ref 150–400)
RBC: 3.6 MIL/uL — ABNORMAL LOW (ref 3.87–5.11)
RDW: 14.8 % (ref 11.5–15.5)
WBC: 13.3 10*3/uL — ABNORMAL HIGH (ref 4.0–10.5)
nRBC: 0 % (ref 0.0–0.2)

## 2023-02-05 LAB — MAGNESIUM: Magnesium: 2 mg/dL (ref 1.7–2.4)

## 2023-02-05 MED ORDER — HYDRALAZINE HCL 20 MG/ML IJ SOLN
10.0000 mg | Freq: Four times a day (QID) | INTRAMUSCULAR | Status: DC
  Administered 2023-02-05 – 2023-02-10 (×20): 10 mg via INTRAVENOUS
  Filled 2023-02-05 (×20): qty 1

## 2023-02-05 MED ORDER — POTASSIUM CHLORIDE 10 MEQ/100ML IV SOLN
10.0000 meq | INTRAVENOUS | Status: AC
  Administered 2023-02-05 (×3): 10 meq via INTRAVENOUS
  Filled 2023-02-05 (×3): qty 100

## 2023-02-05 MED ORDER — HEPARIN BOLUS VIA INFUSION
1000.0000 [IU] | Freq: Once | INTRAVENOUS | Status: AC
  Administered 2023-02-05: 1000 [IU] via INTRAVENOUS
  Filled 2023-02-05: qty 1000

## 2023-02-05 MED ORDER — LABETALOL HCL 5 MG/ML IV SOLN: 10.0000 mg | INTRAVENOUS | Status: DC | PRN

## 2023-02-05 MED ORDER — ORAL CARE MOUTH RINSE: 15.0000 mL | OROMUCOSAL | Status: DC | PRN

## 2023-02-05 NOTE — Progress Notes (Signed)
Nutrition Follow-up  DOCUMENTATION CODES:   Not applicable  INTERVENTION:   -RD will follow for diet advancement and add supplements as appropriate  NUTRITION DIAGNOSIS:   Increased nutrient needs related to post-op healing as evidenced by estimated needs.  Ongoing  GOAL:   Patient will meet greater than or equal to 90% of their needs  Unmet  MONITOR:   PO intake, Supplement acceptance, Diet advancement  REASON FOR ASSESSMENT:   Consult Assessment of nutrition requirement/status, Hip fracture protocol  ASSESSMENT:   Pt with medical history significant for hypertension, hyperlipidemia, and interstitial cystitis patient presents with severe left groin pain and headache after mechanical fall at home.  5/6- s/p PERCUTANEOUS FIXATION OF LEFT FEMORAL NECK HIP FRACTURE  5/7-- rapid response called 5/8- NGT placed for decompression, CT of chest showed incidental ileus  Reviewed I/O's: -1.2 L x 24 hours and -1.9 L since admission  UOP: 1.3 L x 24 hours  NGT output: 850 ml x 24 hours   Per general surgery notes, no need for surgical intervention at this time. Plan for continued decompression and NPO status.   Medications reviewed and include colace, senokot, cardizem, and lactated ringers with potassium chloride @ 75 ml/hr.   Labs reviewed: Na: 134, CBGS: 136.   Diet Order:   Diet Order             Diet NPO time specified  Diet effective now                   EDUCATION NEEDS:   Education needs have been addressed  Skin:  Skin Assessment: Skin Integrity Issues: Skin Integrity Issues:: Incisions Incisions: closed lt hip  Last BM:  Unknown  Height:   Ht Readings from Last 1 Encounters:  02/01/23 5\' 4"  (1.626 m)    Weight:   Wt Readings from Last 1 Encounters:  02/05/23 71.1 kg    Ideal Body Weight:  54.5 kg  BMI:  Body mass index is 26.91 kg/m.  Estimated Nutritional Needs:   Kcal:  1600-1800  Protein:  80-95 grams  Fluid:  > 1.6  L    Levada Schilling, RD, LDN, CDCES Registered Dietitian II Certified Diabetes Care and Education Specialist Please refer to [facility] for RD and/or RD on-call/weekend/after hours pager

## 2023-02-05 NOTE — Progress Notes (Signed)
Subjective:  POD #3 s/p percutaneous fixation for left impacted femoral neck hip fracture.   Patient reports left hip pain as mild.  Patient with NG tube for postoperative ileus.  KUB today shows continued dilated loops of bowel.  General surgery note states continue n.p.o. with IV fluid resuscitation and NG tube decompression.  Patient's sister is at the bedside.  Patient is resting but arousable and follows commands and answers questions appropriately.  She states that she does not feel well.  Objective:   VITALS:   Vitals:   02/05/23 0700 02/05/23 0923 02/05/23 0931 02/05/23 1126  BP: (!) 185/72   (!) 178/68  Pulse: (!) 104   92  Resp: (!) 27   (!) 26  Temp:    99.7 F (37.6 C)  TempSrc:    Axillary  SpO2: 93% (!) 89% 91% 93%  Weight:      Height:        PHYSICAL EXAM: Left lower extremity Neurovascular intact Sensation intact distally Intact pulses distally Dorsiflexion/Plantar flexion intact Incision: dressing C/D/I No cellulitis present Compartment soft  LABS  Results for orders placed or performed during the hospital encounter of 02/01/23 (from the past 24 hour(s))  Heparin level (unfractionated)     Status: Abnormal   Collection Time: 02/04/23  1:42 PM  Result Value Ref Range   Heparin Unfractionated <0.10 (L) 0.30 - 0.70 IU/mL  Heparin level (unfractionated)     Status: Abnormal   Collection Time: 02/05/23 12:50 AM  Result Value Ref Range   Heparin Unfractionated 0.24 (L) 0.30 - 0.70 IU/mL  CBC     Status: Abnormal   Collection Time: 02/05/23  3:20 AM  Result Value Ref Range   WBC 13.3 (H) 4.0 - 10.5 K/uL   RBC 3.60 (L) 3.87 - 5.11 MIL/uL   Hemoglobin 10.4 (L) 12.0 - 15.0 g/dL   HCT 16.1 (L) 09.6 - 04.5 %   MCV 85.6 80.0 - 100.0 fL   MCH 28.9 26.0 - 34.0 pg   MCHC 33.8 30.0 - 36.0 g/dL   RDW 40.9 81.1 - 91.4 %   Platelets 180 150 - 400 K/uL   nRBC 0.0 0.0 - 0.2 %  Basic metabolic panel     Status: Abnormal   Collection Time: 02/05/23  3:20 AM  Result  Value Ref Range   Sodium 134 (L) 135 - 145 mmol/L   Potassium 3.5 3.5 - 5.1 mmol/L   Chloride 104 98 - 111 mmol/L   CO2 22 22 - 32 mmol/L   Glucose, Bld 120 (H) 70 - 99 mg/dL   BUN 15 8 - 23 mg/dL   Creatinine, Ser 7.82 0.44 - 1.00 mg/dL   Calcium 7.7 (L) 8.9 - 10.3 mg/dL   GFR, Estimated >95 >62 mL/min   Anion gap 8 5 - 15  Magnesium     Status: None   Collection Time: 02/05/23  3:20 AM  Result Value Ref Range   Magnesium 2.0 1.7 - 2.4 mg/dL  Heparin level (unfractionated)     Status: None   Collection Time: 02/05/23  9:52 AM  Result Value Ref Range   Heparin Unfractionated 0.39 0.30 - 0.70 IU/mL    DG ABD ACUTE 2+V W 1V CHEST  Result Date: 02/05/2023 CLINICAL DATA:  Ileus. EXAM: DG ABDOMEN ACUTE WITH 1 VIEW CHEST COMPARISON:  Chest/abdominal radiographs 02/04/2023 FINDINGS: The cardiomediastinal silhouette is unchanged. Interstitial and patchy airspace opacities bilaterally, greatest in the perihilar regions, have mildly worsened. No sizable pleural effusion  or pneumothorax is identified. An enteric tube has been placed and terminates in the expected region of the gastric body with side port also overlying the stomach. No intraperitoneal free air is identified. Mild dilatation of multiple gas-filled loops of small bowel has slightly improved. Gas and stool are present in nondilated colon. Fixation screws are noted in the proximal left femur. IMPRESSION: 1. Slightly improved small bowel dilatation which may reflect ileus or partial obstruction. 2. Mildly worsened bilateral lung opacities concerning for pneumonia. Electronically Signed   By: Sebastian Ache M.D.   On: 02/05/2023 08:50   DG ABD ACUTE 2+V W 1V CHEST  Result Date: 02/04/2023 CLINICAL DATA:  Gastric outlet obstruction. EXAM: DG ABDOMEN ACUTE WITH 1 VIEW CHEST COMPARISON:  Feb 01, 2023. FINDINGS: Mildly dilated small bowel loops are noted concerning for distal small bowel obstruction or ileus. Residual contrast is noted in urinary  bladder. No radiopaque calculi or other significant radiographic abnormality is seen. Heart size and mediastinal contours are within normal limits. Mild bilateral perihilar opacities are noted concerning for pulmonary edema or inflammation, right greater than left. IMPRESSION: Dilated small bowel loops are noted concerning for distal small bowel obstruction or ileus. Bilateral lung opacities are noted, right greater than left, concerning for pulmonary edema or inflammation. Electronically Signed   By: Lupita Raider M.D.   On: 02/04/2023 12:55   ECHOCARDIOGRAM COMPLETE  Result Date: 02/04/2023    ECHOCARDIOGRAM REPORT   Patient Name:   Rachael Austin [facility] Date of Exam: 02/03/2023 Medical Rec #:  [id number]          Height:       64.0 in Accession #:    [id number]         Weight:       142.0 lb Date of Birth:  1941-01-05          BSA:          1.691 m Patient Age:    82 years           BP:           159/63 mmHg Patient Gender: F                  HR:           115 bpm. Exam Location:  [facility] Procedure: 2D Echo, Cardiac Doppler and Color Doppler Indications:     R07.9 Chest pain  History:         Patient has prior history of Echocardiogram examinations, most                  recent 11/21/2021.  Sonographer:     Daphine Deutscher RDCS Referring Phys:  [id number] Diagnosing Phys: Yvonne Kendall MD IMPRESSIONS  1. Left ventricular ejection fraction, by estimation, is 65 to 70%. The left ventricle has normal function. The left ventricle has no regional wall motion abnormalities. There is mild left ventricular hypertrophy. Left ventricular diastolic parameters are consistent with Grade I diastolic dysfunction (impaired relaxation). Elevated left atrial pressure.  2. Right ventricular systolic function is mildly reduced. The right ventricular size is normal. There is normal pulmonary artery systolic pressure.  3. The mitral valve is normal in structure. Trivial mitral valve regurgitation. No evidence of mitral  stenosis.  4. Tricuspid valve regurgitation is mild to moderate.  5. The aortic valve has an indeterminant number of cusps. There is mild calcification of the aortic valve. There is mild thickening of the aortic  valve. Aortic valve regurgitation is not visualized. Aortic valve sclerosis/calcification is present, without any evidence of aortic stenosis.  6. The inferior vena cava is normal in size with <50% respiratory variability, suggesting right atrial pressure of 8 mmHg. FINDINGS  Left Ventricle: Left ventricular ejection fraction, by estimation, is 65 to 70%. The left ventricle has normal function. The left ventricle has no regional wall motion abnormalities. The left ventricular internal cavity size was normal in size. There is  mild left ventricular hypertrophy. Left ventricular diastolic parameters are consistent with Grade I diastolic dysfunction (impaired relaxation). Elevated left atrial pressure. Right Ventricle: The right ventricular size is normal. No increase in right ventricular wall thickness. Right ventricular systolic function is mildly reduced. There is normal pulmonary artery systolic pressure. The tricuspid regurgitant velocity is 2.50 m/s, and with an assumed right atrial pressure of 8 mmHg, the estimated right ventricular systolic pressure is 33.0 mmHg. Left Atrium: Left atrial size was normal in size. Right Atrium: Right atrial size was normal in size. Pericardium: There is no evidence of pericardial effusion. Mitral Valve: The mitral valve is normal in structure. Trivial mitral valve regurgitation. No evidence of mitral valve stenosis. Tricuspid Valve: The tricuspid valve is normal in structure. Tricuspid valve regurgitation is mild to moderate. Aortic Valve: The aortic valve has an indeterminant number of cusps. There is mild calcification of the aortic valve. There is mild thickening of the aortic valve. Aortic valve regurgitation is not visualized. Aortic valve sclerosis/calcification is  present, without any evidence of aortic stenosis. Aortic valve mean gradient measures 9.0 mmHg. Aortic valve peak gradient measures 11.4 mmHg. Aortic valve area, by VTI measures 2.05 cm. Pulmonic Valve: The pulmonic valve was normal in structure. Pulmonic valve regurgitation is trivial. No evidence of pulmonic stenosis. Aorta: The aortic root is normal in size and structure. Pulmonary Artery: The pulmonary artery is of normal size. Venous: The inferior vena cava is normal in size with less than 50% respiratory variability, suggesting right atrial pressure of 8 mmHg. IAS/Shunts: The interatrial septum was not well visualized.  LEFT VENTRICLE PLAX 2D LVIDd:         3.40 cm   Diastology LVIDs:         2.30 cm   LV e' medial:    6.20 cm/s LV PW:         1.20 cm   LV E/e' medial:  17.3 LV IVS:        1.20 cm   LV e' lateral:   6.05 cm/s LVOT diam:     1.90 cm   LV E/e' lateral: 17.7 LV SV:         53 LV SV Index:   31 LVOT Area:     2.84 cm  RIGHT VENTRICLE             IVC RV Basal diam:  3.30 cm     IVC diam: 1.60 cm RV S prime:     10.22 cm/s LEFT ATRIUM             Index        RIGHT ATRIUM           Index LA diam:        4.20 cm 2.48 cm/m   RA Area:     10.90 cm LA Vol (A2C):   41.3 ml 24.42 ml/m  RA Volume:   25.70 ml  15.19 ml/m LA Vol (A4C):   36.5 ml 21.58 ml/m LA Biplane Vol: 39.2  ml 23.18 ml/m  AORTIC VALVE AV Area (Vmax):    1.83 cm AV Area (Vmean):   1.80 cm AV Area (VTI):     2.05 cm AV Vmax:           168.78 cm/s AV Vmean:          118.474 cm/s AV VTI:            0.259 m AV Peak Grad:      11.4 mmHg AV Mean Grad:      9.0 mmHg LVOT Vmax:         108.75 cm/s LVOT Vmean:        75.350 cm/s LVOT VTI:          0.188 m LVOT/AV VTI ratio: 0.72  AORTA Ao Root diam: 3.30 cm Ao Asc diam:  2.90 cm MITRAL VALVE                TRICUSPID VALVE MV Area (PHT): 2.93 cm     TR Peak grad:   25.0 mmHg MV Decel Time: 259 msec     TR Vmax:        250.00 cm/s MV E velocity: 107.14 cm/s MV A velocity: 144.50 cm/s   SHUNTS MV E/A ratio:  0.74         Systemic VTI:  0.19 m                             Systemic Diam: 1.90 cm Yvonne Kendall MD Electronically signed by Yvonne Kendall MD Signature Date/Time: 02/04/2023/7:35:40 AM    Final    CT Angio Chest Pulmonary Embolism (PE) W or WO Contrast  Result Date: 02/03/2023 CLINICAL DATA:  High probability of pulmonary embolus. EXAM: CT ANGIOGRAPHY CHEST WITH CONTRAST TECHNIQUE: Multidetector CT imaging of the chest was performed using the standard protocol during bolus administration of intravenous contrast. Multiplanar CT image reconstructions and MIPs were obtained to evaluate the vascular anatomy. RADIATION DOSE REDUCTION: This exam was performed according to the departmental dose-optimization program which includes automated exposure control, adjustment of the mA and/or kV according to patient size and/or use of iterative reconstruction technique. CONTRAST:  60mL OMNIPAQUE IOHEXOL 350 MG/ML SOLN COMPARISON:  None Available. FINDINGS: Cardiovascular: Satisfactory opacification of the pulmonary arteries to the segmental level. No evidence of pulmonary embolism. Normal heart size. No pericardial effusion. Coronary artery calcifications are noted. Mediastinum/Nodes: Thyroid gland is unremarkable. No adenopathy is noted. Severely dilated and fluid-filled esophagus is noted. Lungs/Pleura: No pneumothorax or pleural effusion is noted. Patchy airspace opacities are noted throughout the upper and lower lobes bilaterally most consistent with multifocal pneumonia. Upper Abdomen: Moderate to severe gastric distention is noted. Musculoskeletal: No chest wall abnormality. No acute or significant osseous findings. Review of the MIP images confirms the above findings. IMPRESSION: No definite evidence of pulmonary embolus. Patchy airspace opacities are noted throughout both lungs most consistent with multifocal pneumonia. Severely dilated and fluid-filled esophagus is noted as well as moderate  to severe gastric distention seen in visualized portion of upper abdomen. This is concerning for gastric outlet obstruction. Coronary artery calcifications are noted suggesting coronary artery disease. Aortic Atherosclerosis (ICD10-I70.0). Electronically Signed   By: Lupita Raider M.D.   On: 02/03/2023 19:03    Assessment/Plan: 3 Days Post-Op   Principal Problem:   Closed left hip fracture, initial encounter (HCC) Active Problems:   Hypercholesterolemia   Hypertensive urgency   Prolonged QT interval  Subcapital fracture of hip, left, closed, initial encounter (HCC)   PAF (paroxysmal atrial fibrillation) (HCC)   Ileus (HCC)   Ileus, postoperative (HCC)   Multifocal pneumonia  Patient is stable from an orthopedic standpoint.  Her dressing remains clean and dry.  Her hemoglobin and hematocrit are within acceptable limits.  General surgery note states the patient can be mobilized and I recommend continuing physical and occupational therapy as the patient can tolerate.  Patient is on heparin currently which will cover for DVT prophylaxis.    Juanell Fairly , MD 02/05/2023, 12:16 PM

## 2023-02-05 NOTE — Evaluation (Signed)
Physical Therapy Re-Evaluation Patient Details Name: Rachael Austin MRN: [id number] DOB: 12-02-1940 Today's Date: 02/05/2023  History of Present Illness  Pt is an 82 y/o F admitted on 02/01/23 after presenting with c/o a fall. Pt found to have L impacted femoral neck hip fx & underwent percutaneous fixation on 02/02/23. PMH: HTN, interstitial cystitis, DDD, GERD, hypercholesterolemia. Found to have post-operative ileus  Clinical Impression  Pt pleasant and eager to see what she could do but ultimately limited due to generally with activity feeling poorly, changes in vitals and fatigue.  Pt needed assist with bed mobility and getting to standing, though she showed great effort with each.  She was able to do 2 standing bouts at EOB and did manage a few small, laborious side steps along EOB with PT assist to insure 25% L PWBing but ultimately each time she needed to sit abruptly.  Pt with expected pain but tolerated activities well.  Pt will need continued PT to address functional limitations and facilitate return to PLOF post hip surgery and subsequent medical complications.         Recommendations for follow up therapy are one component of a multi-disciplinary discharge planning process, led by the attending physician.  Recommendations may be updated based on patient status, additional functional criteria and insurance authorization.  Follow Up Recommendations Can patient physically be transported by private vehicle: No     Assistance Recommended at Discharge Frequent or constant Supervision/Assistance  Patient can return home with the following  A lot of help with walking and/or transfers;A lot of help with bathing/dressing/bathroom;Assist for transportation;Assistance with cooking/housework;Direct supervision/assist for financial management;Help with stairs or ramp for entrance;Direct supervision/assist for medications management    Equipment Recommendations Rolling walker (2 wheels) (TBD at  next venue)  Recommendations for Other Services       Functional Status Assessment Patient has had a recent decline in their functional status and demonstrates the ability to make significant improvements in function in a reasonable and predictable amount of time.     Precautions / Restrictions Precautions Precautions: Fall Restrictions Weight Bearing Restrictions: Yes LLE Weight Bearing: Partial weight bearing LLE Partial Weight Bearing Percentage or Pounds: 25%      Mobility  Bed Mobility Overal bed mobility: Needs Assistance Bed Mobility: Rolling, Supine to Sit, Sit to Supine Rolling: Min assist   Supine to sit: Mod assist Sit to supine: Max assist   General bed mobility comments: extra time, cuing for technique, use of bed rails, HOB elevated - good effort but ultimately needing increased assist, max assist post standing activity to get back to supine    Transfers Overall transfer level: Needs assistance Equipment used: Rolling walker (2 wheels) Transfers: Sit to/from Stand Sit to Stand: Min assist           General transfer comment: Pt needed extra time in sitting to prep for standing but showed great effort with each attempt.  Pt needed min/modA to rise but did well to maintain PWBing on the L.  Pt tolerated standing ~2 minutes each time but felt poorly and needed to sit rather abruptly each time as well.  Pt eager to try as much as she can but ultimately limited due to nasuea, feeling poorly, changes in vitals (HR up to nearly 130, SpO2 down to 86%).    Ambulation/Gait               General Gait Details: no true ambulation but with both standing attempts she was able to  manage a few small, labored side steps along EOB with plenty of cuing for WBing/UE use and min/modA to insure PWBing on L.  Stairs            Wheelchair Mobility    Modified Rankin (Stroke Patients Only)       Balance Overall balance assessment: Needs assistance Sitting-balance  support: Feet supported, Bilateral upper extremity supported Sitting balance-Leahy Scale: Fair     Standing balance support: Bilateral upper extremity supported Standing balance-Leahy Scale: Poor Standing balance comment: poor tolerance, heavy UE reliance but able to statically maintain 25% L WBing - no LOBs                             Pertinent Vitals/Pain Pain Assessment Pain Assessment: 0-10 Pain Score: 5  Pain Location: L LE, adbomen, nose (NG tube), L arm (IV)    Home Living Family/patient expects to be discharged to:: Unsure Living Arrangements: Alone Available Help at Discharge: Family;Friend(s);Available PRN/intermittently Type of Home: House Home Access: Stairs to enter Entrance Stairs-Rails: None Entrance Stairs-Number of Steps: 1   Home Layout: One level Home Equipment: Tub bench      Prior Function Prior Level of Function : Independent/Modified Independent;Driving             Mobility Comments: Independent without AD, will use walking stick outside. Drives, runs errands, etc       Hand Dominance        Extremity/Trunk Assessment   Upper Extremity Assessment Upper Extremity Assessment: Generalized weakness    Lower Extremity Assessment Lower Extremity Assessment: Generalized weakness RLE Deficits / Details: R LE grossly 4-/5 LLE Deficits / Details: L hip 2+/5, otherwise 3/5       Communication   Communication: No difficulties  Cognition Arousal/Alertness: Awake/alert Behavior During Therapy: WFL for tasks assessed/performed Overall Cognitive Status: Within Functional Limits for tasks assessed                                          General Comments General comments (skin integrity, edema, etc.): SpO2 generally staying 88-93% on 2L (NG tube making [place] less effective)  BP taken multiple times t/o session in sitting and standing, generally elevated systolic in the 180-150 range and diastolic 120-70, nursing  notified    Exercises General Exercises - Lower Extremity Ankle Circles/Pumps: AROM, 10 reps, Left Quad Sets: Strengthening, 10 reps, Left Short Arc Quad: 10 reps, Left, AROM Heel Slides: AROM, 10 reps, Left (lightly resisted leg ext) Hip ABduction/ADduction: Strengthening, AROM, 10 reps, Left   Assessment/Plan    PT Assessment Patient needs continued PT services  PT Problem List Decreased strength;Cardiopulmonary status limiting activity;Decreased range of motion;Decreased activity tolerance;Decreased balance;Decreased mobility;Decreased safety awareness;Decreased knowledge of use of DME;Decreased knowledge of precautions       PT Treatment Interventions DME instruction;Therapeutic exercise;Gait training;Balance training;Wheelchair mobility training;Stair training;Functional mobility training;Therapeutic activities;Patient/family education;Manual techniques;Modalities;Neuromuscular re-education    PT Goals (Current goals can be found in the Care Plan section)  Acute Rehab PT Goals Patient Stated Goal: get moving better PT Goal Formulation: With patient Time For Goal Achievement: 02/18/23 Potential to Achieve Goals: Fair    Frequency 7X/week     Co-evaluation               AM-PAC PT "6 Clicks" Mobility  Outcome Measure Help needed turning from your  back to your side while in a flat bed without using bedrails?: A Little Help needed moving from lying on your back to sitting on the side of a flat bed without using bedrails?: A Lot Help needed moving to and from a bed to a chair (including a wheelchair)?: A Little Help needed standing up from a chair using your arms (e.g., wheelchair or bedside chair)?: A Lot Help needed to walk in hospital room?: Total Help needed climbing 3-5 steps with a railing? : Total 6 Click Score: 12    End of Session Equipment Utilized During Treatment: Gait belt;Oxygen Activity Tolerance: Patient limited by fatigue (feeling poorly,  lightheadedness) Patient left: with bed alarm set;with family/visitor present Nurse Communication: Mobility status;Weight bearing status PT Visit Diagnosis: Difficulty in walking, not elsewhere classified (R26.2);Other abnormalities of gait and mobility (R26.89);Muscle weakness (generalized) (M62.81)    Time: 1478-2956 PT Time Calculation (min) (ACUTE ONLY): 40 min   Charges:   PT Evaluation $PT Re-evaluation: 1 Re-eval PT Treatments $Therapeutic Exercise: 8-22 mins $Therapeutic Activity: 8-22 mins        Malachi Pro, DPT 02/05/2023, 6:13 PM

## 2023-02-05 NOTE — Consult Note (Signed)
ANTICOAGULATION CONSULT NOTE - Initial Consult  Pharmacy Consult for heparin infusion Indication: chest pain/ACS  Allergies  Allergen Reactions   Augmentin [Amoxicillin-Pot Clavulanate] Swelling and Rash    Swelling of the lips   Lexapro [Escitalopram Oxalate]    Micardis [Telmisartan] Itching   Myrbetriq [Mirabegron] Itching   Niacin And Related Itching   Codeine Sulfate Other (See Comments)    "Makes her hyper"   Vesicare [Solifenacin Succinate] Nausea And Vomiting and Rash    Patient Measurements: Height: 5\' 4"  (162.6 cm) Weight: 71.4 kg (157 lb 6.5 oz) (verfied by Katie T, NT. 1 sheet, 1 blanket, 1 chux pad, 1 pillow) IBW/kg (Calculated) : 54.7 Heparin Dosing Weight: 64.4 kg   Vital Signs: Temp: 97.9 F (36.6 C) (05/09 0004) Temp Source: Oral (05/09 0004) BP: 172/67 (05/09 0004) Pulse Rate: 98 (05/09 0004)  Labs: Recent Labs    02/03/23 0519 02/03/23 1747 02/03/23 2329 02/04/23 0411 02/04/23 1342 02/05/23 0050  HGB 12.1 12.5  --  11.4*  --   --   HCT 37.0 37.5  --  34.2*  --   --   PLT 192 198  --  186  --   --   HEPARINUNFRC  --   --   --  <0.10* <0.10* 0.24*  CREATININE 1.35* 1.11*  --  0.82  --   --   TROPONINIHS  --  15 17 18*  --   --      Estimated Creatinine Clearance: 50.4 mL/min (by C-G formula based on SCr of 0.82 mg/dL).   Medical History: Past Medical History:  Diagnosis Date   AK (actinic keratosis) 11/12/2022   left upper arm, tx'd with EDC   AK (actinic keratosis) 11/12/2022   right medial pretibia, LN2 11/25/22   Basal cell carcinoma 06/21/2020   left post shoulder sup, left mid chest, left lat thigh   Basal cell carcinoma 11/07/2021   L upper back, EDC   Basal cell carcinoma 11/07/2021   R upper back, EDC   Basal cell carcinoma 11/27/2021   right upper arm, EDC   Basal cell carcinoma 11/27/2021   right shoulder posterior, EDC   Basal cell carcinoma 11/27/2021   right anterior shoulder, EDC   Basal cell carcinoma (BCC)  06/13/2021   left dorsal foot, EDC 10/02/2021   Basal cell carcinoma (BCC) 06/13/2021   right postauricular tx'd w/ EDC   BCC (basal cell carcinoma of skin) 03/30/2007   R inf med pretibial - BCC   BCC (basal cell carcinoma of skin) 10/12/2013   L nasal ala - BCC   BCC (basal cell carcinoma of skin) 03/10/2018   L mid dorsum med forearm - superficial BCC    BCC (basal cell carcinoma) 10/02/2021   left dorsal foot proximal, EDC 10/02/2021   BCC (basal cell carcinoma) 10/02/2021   left mid back, superficial EDC 11/07/21   BCC (basal cell carcinoma) 10/02/2021   right pretibia   BCC (basal cell carcinoma) 10/02/2021   right mid back, superficial EDC 11/07/21   BCC (basal cell carcinoma) 08/14/2022   left distal calf, tx'd with EDC   BCC (basal cell carcinoma) 11/12/2022   superficial at left lateral pretibial,l tx'd with EDC   BCC (basal cell carcinoma) 11/12/2022   superficial at mid back right of midline, scheduled for EDC   BCC (basal cell carcinoma) 11/12/2022   mid back right of midline, EDC 11/25/22   Breast screening, unspecified 2013   Cancer (HCC) 1992   skin  Degenerative disk disease    GERD (gastroesophageal reflux disease)    History of basal cell carcinoma (BCC) 08/29/2020    left inferior knee lateral, , left inferior knee medial, right pretibia   History of SCC (squamous cell carcinoma) of skin 08/29/2020   right posterior calf, left lateral calf, and    Hypercholesterolemia 2008   Interstitial cystitis    followed by Dr Achilles Dunk   Other sign and symptom in breast 2013   Left upper outer quadrant breast "soreness" Ultrasound exam of right breast in the 2 o'clock position with the breast distracted medially showed a 0.3-0.4 with 0.5 cm simple cyst. In the 1 o'clock position where pt reported tenderness US exam was negatiive.  Because of her history of intermittent nipple drainage, ultrasound was completed of the retroareolar area.   Personal history of tobacco use, presenting  hazards to health    PONV (postoperative nausea and vomiting)    SCC (squamous cell carcinoma) 03/28/2008   R dorsum hand - SCC   SCC (squamous cell carcinoma) 05/09/2009   R lat lower leg - SCCIS   SCC (squamous cell carcinoma) 05/09/2009   L lat lower leg - SCCIS   SCC (squamous cell carcinoma) 04/07/2013   L lower leg - SCCIS   SCC (squamous cell carcinoma) 04/27/2013   R forearm - SCC   SCC (squamous cell carcinoma) 04/28/2017   L lat knee - SCCIS   SCC (squamous cell carcinoma) 05/12/2018   L prox dorsum forearm - SCC   SCC (squamous cell carcinoma) 06/13/2021   left forearm tx'd w/ EDC   SCC (squamous cell carcinoma), leg, right 03/30/2007   R sup pretibial - SCCIS   SCC (squamous cell carcinoma);BCC 10/12/2013   Mid back - superficial BCC with SCCIS    Special screening for malignant neoplasms, colon    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   left dorsal forearm, left lat calf   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 Last dose 5/7 @ 0900  Assessment: 82 y.o. female with a hx of hypertension, hyperlipidemia, aortic atherosclerosis, syncope, and recurrent falls, admitted 02/01/23 with left hip fracture 2/2 mechanical fall. Post-Op Day 1 Left femoral neck fracture fixation on 02/02/23. On 02/03/23 patient with new onset chest pain and abnormal EKG. Pharmacy has been consulted to initiate and manage IV heparin therapy.  Initial troponin 15.  Goal of Therapy:  Heparin level 0.3-0.7 units/ml Monitor platelets by anticoagulation protocol: Yes   5/8: HL @ 0411 = < 0.1, SUBtherapeutic 5/8: HL @ 1342 = < 0.1, SUBtherapeutic 5/9: HL @ 0050 =  0.24,  SUBtherapeutic   Plan: Very diminished response to heparin increases so far. If continuing to remain below detectable limit, suspicion for  ATIII deficiency. Will hit 48 hrs of UFH on 5/9 @ 2000.  5/9:  HL @ 0050 = 0.24, SUBtherapeutic  -   Will order heparin 1000 units IV X 1 bolus and increase drip rate to 1400 units/hr.  - Will recheck HL 8 hrs after rate change Continue to monitor H&H and platelets daily while on heparin gtt.  Scherrie Gerlach, PharmD Clinical Pharmacist   02/05/2023,1:44 AM

## 2023-02-05 NOTE — Progress Notes (Signed)
Occupational Therapy Re-Evaluation Patient Details Name: Rachael Austin MRN: [id number] DOB: June 08, 1941 Today's Date: 02/05/2023   History of present illness Pt is an 82 y/o F admitted on 02/01/23 after presenting with c/o a fall. Pt found to have L impacted femoral neck hip fx & underwent percutaneous fixation on 02/02/23. PMH: HTN, interstitial cystitis, DDD, GERD, hypercholesterolemia. Found to have post-operative ileus   OT comments  Ms Mellenthin was seen for OT re-evaluation on this date, now s/p post-op ileus. Upon arrival to room pt recline din bed, appears lethargic however arousable and agreeable to tx. Pt requires MAX A don underwear at bed level. MAX A pericare bed level, MIN A L+R rolling for linen change. MAX A sup<>sit, fair sitting balance however poor tolerance. SpO2 86% in in sitting. Goals remain appropriate, will continue to follow POC. Discharge recommendation remains appropriate.     Recommendations for follow up therapy are one component of a multi-disciplinary discharge planning process, led by the attending physician.  Recommendations may be updated based on patient status, additional functional criteria and insurance authorization.    Assistance Recommended at Discharge Frequent or constant Supervision/Assistance  Patient can return home with the following  A lot of help with walking and/or transfers;A lot of help with bathing/dressing/bathroom;Help with stairs or ramp for entrance   Equipment Recommendations  BSC/3in1    Recommendations for Other Services      Precautions / Restrictions Precautions Precautions: Fall Restrictions Weight Bearing Restrictions: Yes LLE Weight Bearing: Partial weight bearing LLE Partial Weight Bearing Percentage or Pounds: 25%       Mobility Bed Mobility Overal bed mobility: Needs Assistance Bed Mobility: Rolling, Supine to Sit, Sit to Supine Rolling: Min assist   Supine to sit: Max assist Sit to supine: Max assist         Transfers Overall transfer level: Needs assistance Equipment used: None Transfers: Bed to chair/wheelchair/BSC            Lateral/Scoot Transfers: Min assist       Balance Overall balance assessment: Needs assistance Sitting-balance support: Feet supported, Bilateral upper extremity supported Sitting balance-Leahy Scale: Fair                                     ADL either performed or assessed with clinical judgement   ADL Overall ADL's : Needs assistance/impaired                                       General ADL Comments: MAX A don underwear at bed level. MAX A pericare bed level, MIN A L+R rolling for linen change.    Extremity/Trunk Assessment Upper Extremity Assessment Upper Extremity Assessment: Generalized weakness   Lower Extremity Assessment Lower Extremity Assessment: Generalized weakness         Cognition Arousal/Alertness: Awake/alert Behavior During Therapy: WFL for tasks assessed/performed Overall Cognitive Status: Within Functional Limits for tasks assessed                                                General Comments SPO2 86% on RA ([place] does not appear to penetrate NG tubing)    Pertinent Vitals/ Pain  Pain Assessment Pain Assessment: 0-10 Pain Score: 5  Pain Location: L LE Pain Descriptors / Indicators: Discomfort Pain Intervention(s): Limited activity within patient's tolerance, Repositioned   Frequency  Min 2X/week        Progress Toward Goals  OT Goals(current goals can now be found in the care plan section)  Progress towards OT goals: Progressing toward goals  Acute Rehab OT Goals Patient Stated Goal: to return to PLOF OT Goal Formulation: With patient/family Time For Goal Achievement: 02/19/23 Potential to Achieve Goals: Good ADL Goals Pt Will Perform Grooming: with modified independence;sitting Pt Will Perform Lower Body Dressing: with supervision;with caregiver  independent in assisting;sitting/lateral leans Pt Will Transfer to Toilet: with supervision;stand pivot transfer;bedside commode  Plan Discharge plan remains appropriate;Frequency remains appropriate    Co-evaluation                 AM-PAC OT "6 Clicks" Daily Activity     Outcome Measure   Help from another person eating meals?: None Help from another person taking care of personal grooming?: A Little Help from another person toileting, which includes using toliet, bedpan, or urinal?: A Lot Help from another person bathing (including washing, rinsing, drying)?: A Lot Help from another person to put on and taking off regular upper body clothing?: A Little Help from another person to put on and taking off regular lower body clothing?: A Lot 6 Click Score: 16    End of Session    OT Visit Diagnosis: Other abnormalities of gait and mobility (R26.89);Muscle weakness (generalized) (M62.81)   Activity Tolerance Patient tolerated treatment well   Patient Left in bed;with call bell/phone within reach;with family/visitor present   Nurse Communication Mobility status        Time: 1610-9604 OT Time Calculation (min): 31 min  Charges: OT General Charges $OT Visit: 1 Visit OT Evaluation $OT Re-eval: 1 Re-eval OT Treatments $Self Care/Home Management : 23-37 mins  Kathie Dike, M.S. OTR/L  02/05/23, 4:14 PM  ascom [phone]

## 2023-02-05 NOTE — Progress Notes (Addendum)
Rounding Note    Patient Name: Rachael Austin Date of Encounter: 02/05/2023  [facility] Cardiologist: [facility]-  Subjective   Exhausted from yesterday, family reports she had 17 visitors Did not sleep well overnight, sleeping this morning, comfortable NG tube placed by surgical team for postop ileus KUB with dilated loops of bowel Remains on IV fluids 75 cc/h, diltiazem infusion 7.5 mg/h Blood pressure running very high 190 systolic  Telemetry reviewed, maintaining normal sinus rhythm  Inpatient Medications    Scheduled Meds:  docusate sodium  100 mg Oral BID   feeding supplement  237 mL Oral BID BM   multivitamin with minerals  1 tablet Oral Daily   pantoprazole (PROTONIX) IV  40 mg Intravenous Q12H   pravastatin  20 mg Oral q1800   senna  1 tablet Oral BID   Continuous Infusions:  cefTRIAXone (ROCEPHIN)  IV 200 mL/hr at 02/05/23 0900   diltiazem (CARDIZEM) infusion 15 mg/hr (02/05/23 1232)   doxycycline (VIBRAMYCIN) IV 100 mg (02/05/23 1011)   heparin 1,400 Units/hr (02/05/23 1237)   lactated ringers 1,000 mL with potassium chloride 20 mEq infusion 75 mL/hr at 02/05/23 0546   methocarbamol (ROBAXIN) IV     potassium chloride 10 mEq (02/05/23 1355)   PRN Meds: acetaminophen, alum & mag hydroxide-simeth, bisacodyl, HYDROcodone-acetaminophen, labetalol, menthol-cetylpyridinium **OR** phenol, methocarbamol **OR** methocarbamol (ROBAXIN) IV, morphine injection, mouth rinse, polyethylene glycol, prochlorperazine   Vital Signs    Vitals:   02/05/23 0700 02/05/23 0923 02/05/23 0931 02/05/23 1126  BP: (!) 185/72   (!) 178/68  Pulse: (!) 104   92  Resp: (!) 27   (!) 26  Temp:    99.7 F (37.6 C)  TempSrc:    Axillary  SpO2: 93% (!) 89% 91% 93%  Weight:      Height:        Intake/Output Summary (Last 24 hours) at 02/05/2023 1501 Last data filed at 02/05/2023 0900 Gross per 24 hour  Intake 856.84 ml  Output 2150 ml  Net -1293.16 ml      02/05/2023     4:03 AM 02/04/2023    5:01 AM 02/01/2023    5:56 PM  Last 3 Weights  Weight (lbs) 156 lb 12 oz 157 lb 6.5 oz 142 lb  Weight (kg) 71.1 kg 71.4 kg 64.411 kg      Telemetry    Normal sinus rhythm- Personally Reviewed  ECG     - Personally Reviewed  Physical Exam   GEN: Sleeping Neck: No JVD Cardiac: RRR, no murmurs, rubs, or gallops.  Respiratory: Clear to auscultation bilaterally. GI: Mildly distended MS: No edema; No deformity. Neuro:  Nonfocal  Psych: Sleeping  Labs    High Sensitivity Troponin:   Recent Labs  Lab 02/03/23 1747 02/03/23 2329 02/04/23 0411  TROPONINIHS 15 17 18*     Chemistry Recent Labs  Lab 02/02/23 0425 02/03/23 0519 02/03/23 1747 02/04/23 0411 02/05/23 0320  NA 138   < > 131* 133* 134*  K 3.7   < > 3.8 3.8 3.5  CL 106   < > 97* 104 104  CO2 22   < > 18* 22 22  GLUCOSE 175*   < > 150* 130* 120*  BUN 9   < > 25* 23 15  CREATININE 0.72   < > 1.11* 0.82 0.65  CALCIUM 8.5*   < > 8.6* 8.0* 7.7*  MG 2.2  --   --   --  2.0  GFRNONAA >60   < >  49* >60 >60  ANIONGAP 10   < > 16* 7 8   < > = values in this interval not displayed.    Lipids No results for input(s): "CHOL", "TRIG", "HDL", "LABVLDL", "LDLCALC", "CHOLHDL" in the last 168 hours.  Hematology Recent Labs  Lab 02/03/23 1747 02/04/23 0411 02/05/23 0320  WBC 16.0* 13.8* 13.3*  RBC 4.34 3.93 3.60*  HGB 12.5 11.4* 10.4*  HCT 37.5 34.2* 30.8*  MCV 86.4 87.0 85.6  MCH 28.8 29.0 28.9  MCHC 33.3 33.3 33.8  RDW 14.7 14.7 14.8  PLT 198 186 180   Thyroid No results for input(s): "TSH", "FREET4" in the last 168 hours.  BNPNo results for input(s): "BNP", "PROBNP" in the last 168 hours.  DDimer No results for input(s): "DDIMER" in the last 168 hours.   Radiology    DG ABD ACUTE 2+V W 1V CHEST  Result Date: 02/05/2023 CLINICAL DATA:  Ileus. EXAM: DG ABDOMEN ACUTE WITH 1 VIEW CHEST COMPARISON:  Chest/abdominal radiographs 02/04/2023 FINDINGS: The cardiomediastinal silhouette is  unchanged. Interstitial and patchy airspace opacities bilaterally, greatest in the perihilar regions, have mildly worsened. No sizable pleural effusion or pneumothorax is identified. An enteric tube has been placed and terminates in the expected region of the gastric body with side port also overlying the stomach. No intraperitoneal free air is identified. Mild dilatation of multiple gas-filled loops of small bowel has slightly improved. Gas and stool are present in nondilated colon. Fixation screws are noted in the proximal left femur. IMPRESSION: 1. Slightly improved small bowel dilatation which may reflect ileus or partial obstruction. 2. Mildly worsened bilateral lung opacities concerning for pneumonia. Electronically Signed   By: Sebastian Ache M.D.   On: 02/05/2023 08:50   DG ABD ACUTE 2+V W 1V CHEST  Result Date: 02/04/2023 CLINICAL DATA:  Gastric outlet obstruction. EXAM: DG ABDOMEN ACUTE WITH 1 VIEW CHEST COMPARISON:  Feb 01, 2023. FINDINGS: Mildly dilated small bowel loops are noted concerning for distal small bowel obstruction or ileus. Residual contrast is noted in urinary bladder. No radiopaque calculi or other significant radiographic abnormality is seen. Heart size and mediastinal contours are within normal limits. Mild bilateral perihilar opacities are noted concerning for pulmonary edema or inflammation, right greater than left. IMPRESSION: Dilated small bowel loops are noted concerning for distal small bowel obstruction or ileus. Bilateral lung opacities are noted, right greater than left, concerning for pulmonary edema or inflammation. Electronically Signed   By: Lupita Raider M.D.   On: 02/04/2023 12:55   ECHOCARDIOGRAM COMPLETE  Result Date: 02/04/2023    ECHOCARDIOGRAM REPORT   Patient Name:   Rachael Austin [facility] Date of Exam: 02/03/2023 Medical Rec #:  [id number]          Height:       64.0 in Accession #:    [id number]         Weight:       142.0 lb Date of Birth:  [birth year]          BSA:           1.691 m Patient Age:    82 years           BP:           159/63 mmHg Patient Gender: F                  HR:           115 bpm. Exam Location:  [facility] Procedure: 2D Echo,  Cardiac Doppler and Color Doppler Indications:     R07.9 Chest pain  History:         Patient has prior history of Echocardiogram examinations, most                  recent 11/21/2021.  Sonographer:     Daphine Deutscher RDCS Referring Phys:  [id number] CHRISTOPHER END Diagnosing Phys: Yvonne Kendall MD IMPRESSIONS  1. Left ventricular ejection fraction, by estimation, is 65 to 70%. The left ventricle has normal function. The left ventricle has no regional wall motion abnormalities. There is mild left ventricular hypertrophy. Left ventricular diastolic parameters are consistent with Grade I diastolic dysfunction (impaired relaxation). Elevated left atrial pressure.  2. Right ventricular systolic function is mildly reduced. The right ventricular size is normal. There is normal pulmonary artery systolic pressure.  3. The mitral valve is normal in structure. Trivial mitral valve regurgitation. No evidence of mitral stenosis.  4. Tricuspid valve regurgitation is mild to moderate.  5. The aortic valve has an indeterminant number of cusps. There is mild calcification of the aortic valve. There is mild thickening of the aortic valve. Aortic valve regurgitation is not visualized. Aortic valve sclerosis/calcification is present, without any evidence of aortic stenosis.  6. The inferior vena cava is normal in size with <50% respiratory variability, suggesting right atrial pressure of 8 mmHg. FINDINGS  Left Ventricle: Left ventricular ejection fraction, by estimation, is 65 to 70%. The left ventricle has normal function. The left ventricle has no regional wall motion abnormalities. The left ventricular internal cavity size was normal in size. There is  mild left ventricular hypertrophy. Left ventricular diastolic parameters are consistent with Grade I  diastolic dysfunction (impaired relaxation). Elevated left atrial pressure. Right Ventricle: The right ventricular size is normal. No increase in right ventricular wall thickness. Right ventricular systolic function is mildly reduced. There is normal pulmonary artery systolic pressure. The tricuspid regurgitant velocity is 2.50 m/s, and with an assumed right atrial pressure of 8 mmHg, the estimated right ventricular systolic pressure is 33.0 mmHg. Left Atrium: Left atrial size was normal in size. Right Atrium: Right atrial size was normal in size. Pericardium: There is no evidence of pericardial effusion. Mitral Valve: The mitral valve is normal in structure. Trivial mitral valve regurgitation. No evidence of mitral valve stenosis. Tricuspid Valve: The tricuspid valve is normal in structure. Tricuspid valve regurgitation is mild to moderate. Aortic Valve: The aortic valve has an indeterminant number of cusps. There is mild calcification of the aortic valve. There is mild thickening of the aortic valve. Aortic valve regurgitation is not visualized. Aortic valve sclerosis/calcification is present, without any evidence of aortic stenosis. Aortic valve mean gradient measures 9.0 mmHg. Aortic valve peak gradient measures 11.4 mmHg. Aortic valve area, by VTI measures 2.05 cm. Pulmonic Valve: The pulmonic valve was normal in structure. Pulmonic valve regurgitation is trivial. No evidence of pulmonic stenosis. Aorta: The aortic root is normal in size and structure. Pulmonary Artery: The pulmonary artery is of normal size. Venous: The inferior vena cava is normal in size with less than 50% respiratory variability, suggesting right atrial pressure of 8 mmHg. IAS/Shunts: The interatrial septum was not well visualized.  LEFT VENTRICLE PLAX 2D LVIDd:         3.40 cm   Diastology LVIDs:         2.30 cm   LV e' medial:    6.20 cm/s LV PW:         1.20  cm   LV E/e' medial:  17.3 LV IVS:        1.20 cm   LV e' lateral:   6.05 cm/s  LVOT diam:     1.90 cm   LV E/e' lateral: 17.7 LV SV:         53 LV SV Index:   31 LVOT Area:     2.84 cm  RIGHT VENTRICLE             IVC RV Basal diam:  3.30 cm     IVC diam: 1.60 cm RV S prime:     10.22 cm/s LEFT ATRIUM             Index        RIGHT ATRIUM           Index LA diam:        4.20 cm 2.48 cm/m   RA Area:     10.90 cm LA Vol (A2C):   41.3 ml 24.42 ml/m  RA Volume:   25.70 ml  15.19 ml/m LA Vol (A4C):   36.5 ml 21.58 ml/m LA Biplane Vol: 39.2 ml 23.18 ml/m  AORTIC VALVE AV Area (Vmax):    1.83 cm AV Area (Vmean):   1.80 cm AV Area (VTI):     2.05 cm AV Vmax:           168.78 cm/s AV Vmean:          118.474 cm/s AV VTI:            0.259 m AV Peak Grad:      11.4 mmHg AV Mean Grad:      9.0 mmHg LVOT Vmax:         108.75 cm/s LVOT Vmean:        75.350 cm/s LVOT VTI:          0.188 m LVOT/AV VTI ratio: 0.72  AORTA Ao Root diam: 3.30 cm Ao Asc diam:  2.90 cm MITRAL VALVE                TRICUSPID VALVE MV Area (PHT): 2.93 cm     TR Peak grad:   25.0 mmHg MV Decel Time: 259 msec     TR Vmax:        250.00 cm/s MV E velocity: 107.14 cm/s MV A velocity: 144.50 cm/s  SHUNTS MV E/A ratio:  0.74         Systemic VTI:  0.19 m                             Systemic Diam: 1.90 cm Yvonne Kendall MD Electronically signed by Yvonne Kendall MD Signature Date/Time: 02/04/2023/7:35:40 AM    Final    CT Angio Chest Pulmonary Embolism (PE) W or WO Contrast  Result Date: 02/03/2023 CLINICAL DATA:  High probability of pulmonary embolus. EXAM: CT ANGIOGRAPHY CHEST WITH CONTRAST TECHNIQUE: Multidetector CT imaging of the chest was performed using the standard protocol during bolus administration of intravenous contrast. Multiplanar CT image reconstructions and MIPs were obtained to evaluate the vascular anatomy. RADIATION DOSE REDUCTION: This exam was performed according to the departmental dose-optimization program which includes automated exposure control, adjustment of the mA and/or kV according to patient size  and/or use of iterative reconstruction technique. CONTRAST:  60mL OMNIPAQUE IOHEXOL 350 MG/ML SOLN COMPARISON:  None Available. FINDINGS: Cardiovascular: Satisfactory opacification of the pulmonary arteries to the segmental level. No evidence  of pulmonary embolism. Normal heart size. No pericardial effusion. Coronary artery calcifications are noted. Mediastinum/Nodes: Thyroid gland is unremarkable. No adenopathy is noted. Severely dilated and fluid-filled esophagus is noted. Lungs/Pleura: No pneumothorax or pleural effusion is noted. Patchy airspace opacities are noted throughout the upper and lower lobes bilaterally most consistent with multifocal pneumonia. Upper Abdomen: Moderate to severe gastric distention is noted. Musculoskeletal: No chest wall abnormality. No acute or significant osseous findings. Review of the MIP images confirms the above findings. IMPRESSION: No definite evidence of pulmonary embolus. Patchy airspace opacities are noted throughout both lungs most consistent with multifocal pneumonia. Severely dilated and fluid-filled esophagus is noted as well as moderate to severe gastric distention seen in visualized portion of upper abdomen. This is concerning for gastric outlet obstruction. Coronary artery calcifications are noted suggesting coronary artery disease. Aortic Atherosclerosis (ICD10-I70.0). Electronically Signed   By: Lupita Raider M.D.   On: 02/03/2023 19:03    Cardiac Studies   Echocardiogram EF 65 to 70%, mildly reduced RV function, TR mild to moderate  Patient Profile     82 y.o. female with h/o HTN, HLD, aortic atherosclerosis, syncope, recurrent falls who is being seen for new afib and chest pain.   Assessment & Plan    A/P: Paroxysmal atrial fibrillation In the setting of a fall presenting with hypertensive urgency systolic pressure 200, chest pain, aspiration pneumonia, gastric outlet obstruction/small bowel obstruction/ileus on IV fluids, status post left hip  fracture surgery Maintaining normal sinus rhythm past 24 hours -Increase diltiazem up to 15 mg/h for blood pressure Will continue infusion as she is n.p.o. with NG tube in place -High risk of recurrent arrhythmia in the setting of above For recurrent atrial fibrillation,may need amiodarone infusion with diltiazem infusion   Chest pain CT scan negative for PE Atypical features following a fall No plan for ischemic workup at this time Symptoms likely secondary to GI etiology, " chest is congested" NG tube in place, surgery following, postop ileus suspected   Left hip fracture, After mechanical fall Percutaneous fixation of left femoral neck POD #2  Malignant pretension Diltiazem up to 15 mg/h for blood pressure We will add hydralazine 10 mg IV every 6 hours for systolic pressures over 150 Unable to take p.o. medication    Total encounter time more than 35 minutes  Greater than 50% was spent in counseling and coordination of care with the patient   For questions or updates, please contact [facility] Please consult [email or web address] for contact info under        Signed, Julien Nordmann, MD  02/05/2023, 3:01 PM

## 2023-02-05 NOTE — Consult Note (Signed)
ANTICOAGULATION CONSULT NOTE - Initial Consult  Pharmacy Consult for heparin infusion Indication: chest pain/ACS  Allergies  Allergen Reactions   Augmentin [Amoxicillin-Pot Clavulanate] Swelling and Rash    Swelling of the lips   Lexapro [Escitalopram Oxalate]    Micardis [Telmisartan] Itching   Myrbetriq [Mirabegron] Itching   Niacin And Related Itching   Codeine Sulfate Other (See Comments)    "Makes her hyper"   Vesicare [Solifenacin Succinate] Nausea And Vomiting and Rash    Patient Measurements: Height: 5\' 4"  (162.6 cm) Weight: 71.1 kg (156 lb 12 oz) IBW/kg (Calculated) : 54.7 Heparin Dosing Weight: 64.4 kg   Vital Signs: Temp: 99.7 F (37.6 C) (05/09 1126) Temp Source: Axillary (05/09 1126) BP: 178/68 (05/09 1126) Pulse Rate: 92 (05/09 1126)  Labs: Recent Labs    02/03/23 1747 02/03/23 1747 02/03/23 2329 02/04/23 0411 02/04/23 1342 02/05/23 0050 02/05/23 0320 02/05/23 0952  HGB 12.5  --   --  11.4*  --   --  10.4*  --   HCT 37.5  --   --  34.2*  --   --  30.8*  --   PLT 198  --   --  186  --   --  180  --   HEPARINUNFRC  --    < >  --  <0.10* <0.10* 0.24*  --  0.39  CREATININE 1.11*  --   --  0.82  --   --  0.65  --   TROPONINIHS 15  --  17 18*  --   --   --   --    < > = values in this interval not displayed.     Estimated Creatinine Clearance: 51.6 mL/min (by C-G formula based on SCr of 0.65 mg/dL).   Medical History: Past Medical History:  Diagnosis Date   AK (actinic keratosis) 11/12/2022   left upper arm, tx'd with EDC   AK (actinic keratosis) 11/12/2022   right medial pretibia, LN2 11/25/22   Basal cell carcinoma 06/21/2020   left post shoulder sup, left mid chest, left lat thigh   Basal cell carcinoma 11/07/2021   L upper back, EDC   Basal cell carcinoma 11/07/2021   R upper back, EDC   Basal cell carcinoma 11/27/2021   right upper arm, EDC   Basal cell carcinoma 11/27/2021   right shoulder posterior, EDC   Basal cell carcinoma  11/27/2021   right anterior shoulder, EDC   Basal cell carcinoma (BCC) 06/13/2021   left dorsal foot, EDC 10/02/2021   Basal cell carcinoma (BCC) 06/13/2021   right postauricular tx'd w/ EDC   BCC (basal cell carcinoma of skin) 03/30/2007   R inf med pretibial - BCC   BCC (basal cell carcinoma of skin) 10/12/2013   L nasal ala - BCC   BCC (basal cell carcinoma of skin) 03/10/2018   L mid dorsum med forearm - superficial BCC    BCC (basal cell carcinoma) 10/02/2021   left dorsal foot proximal, EDC 10/02/2021   BCC (basal cell carcinoma) 10/02/2021   left mid back, superficial EDC 11/07/21   BCC (basal cell carcinoma) 10/02/2021   right pretibia   BCC (basal cell carcinoma) 10/02/2021   right mid back, superficial EDC 11/07/21   BCC (basal cell carcinoma) 08/14/2022   left distal calf, tx'd with EDC   BCC (basal cell carcinoma) 11/12/2022   superficial at left lateral pretibial,l tx'd with EDC   BCC (basal cell carcinoma) 11/12/2022   superficial at mid back  right of midline, scheduled for EDC   BCC (basal cell carcinoma) 11/12/2022   mid back right of midline, [facility] 11/25/22   Breast screening, unspecified 2013   Cancer (HCC) 1992   skin   Degenerative disk disease    GERD (gastroesophageal reflux disease)    History of basal cell carcinoma (BCC) 08/29/2020    left inferior knee lateral, , left inferior knee medial, right pretibia   History of SCC (squamous cell carcinoma) of skin 08/29/2020   right posterior calf, left lateral calf, and    Hypercholesterolemia 2008   Interstitial cystitis    followed by Dr Achilles Dunk   Other sign and symptom in breast 2013   Left upper outer quadrant breast "soreness" Ultrasound exam of right breast in the 2 o'clock position with the breast distracted medially showed a 0.3-0.4 with 0.5 cm simple cyst. In the 1 o'clock position where pt reported tenderness US exam was negatiive.  Because of her history of intermittent nipple drainage, ultrasound was completed  of the retroareolar area.   Personal history of tobacco use, presenting hazards to health    PONV (postoperative nausea and vomiting)    SCC (squamous cell carcinoma) 03/28/2008   R dorsum hand - SCC   SCC (squamous cell carcinoma) 05/09/2009   R lat lower leg - SCCIS   SCC (squamous cell carcinoma) 05/09/2009   L lat lower leg - SCCIS   SCC (squamous cell carcinoma) 04/07/2013   L lower leg - SCCIS   SCC (squamous cell carcinoma) 04/27/2013   R forearm - SCC   SCC (squamous cell carcinoma) 04/28/2017   L lat knee - SCCIS   SCC (squamous cell carcinoma) 05/12/2018   L prox dorsum forearm - SCC   SCC (squamous cell carcinoma) 06/13/2021   left forearm tx'd w/ EDC   SCC (squamous cell carcinoma), leg, right 03/30/2007   R sup pretibial - SCCIS   SCC (squamous cell carcinoma);BCC 10/12/2013   Mid back - superficial BCC with SCCIS    Special screening for malignant neoplasms, colon    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   left dorsal forearm, left lat calf   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 Last dose 5/7 @ 0900  Assessment: 82 y.o. female with a hx of hypertension, hyperlipidemia, aortic atherosclerosis, syncope, and recurrent falls, admitted 02/01/23 with left hip fracture 2/2 mechanical fall. Post-Op Day 1 Left femoral neck fracture fixation on 02/02/23. On 02/03/23 patient with new onset chest pain and abnormal EKG. Pharmacy has been consulted to initiate and manage IV heparin therapy.  Initial troponin 15.  Goal of Therapy:  Heparin level 0.3-0.7 units/ml Monitor platelets by anticoagulation protocol: Yes   5/8: HL @ 0411 = < 0.1, SUBtherapeutic 5/8: HL @ 1342 = < 0.1, SUBtherapeutic 5/9: HL @ 0050 =  0.24,  SUBtherapeutic 5/9: HL @ 0952 = 0.39, Therapeutic  Plan: Will hit  48 hrs of UFH on 5/9 @ 2000. Continue heparin at 1400 un/hr Recheck confirmatory HL in 8 hrs Continue to monitor H&H and platelets daily while on heparin gtt.  Will M. Dareen Piano, PharmD PGY-1 Pharmacy Resident 02/05/2023 11:45 AM

## 2023-02-05 NOTE — Plan of Care (Signed)
  Problem: Education: Goal: Knowledge of General Education information will improve Description: Including pain rating scale, medication(s)/side effects and non-pharmacologic comfort measures Outcome: Progressing   Problem: Health Behavior/Discharge Planning: Goal: Ability to manage health-related needs will improve Outcome: Not Progressing   Problem: Activity: Goal: Risk for activity intolerance will decrease Outcome: Not Progressing   Problem: Nutrition: Goal: Adequate nutrition will be maintained Outcome: Not Progressing   Problem: Pain Managment: Goal: General experience of comfort will improve Outcome: Not Progressing

## 2023-02-05 NOTE — Progress Notes (Signed)
[facility] SURGICAL PROGRESS NOTE (cpt [phone])  Hospital Day(s): 4.   Post op day(s): 3 Days Post-Op.   Interval History: Patient seen and examined, no acute events or new complaints overnight. Patient reports she remains distended with frequent hiccups. No fever, chills, emesis, denies. Leukocytosis this morning to 13.3K which is stable. Hgb to 10.4. Renal function normal with sCr - 0.65; UO - 1300 ccs. No significant electrolyte derangements. NGT output 850 ccs. KUB this AM continues to show loops of dilated small bowel consistent with ileus. No flatus   Review of Systems:  Constitutional: denies fever, chills  HEENT: denies cough or congestion  Respiratory: denies any shortness of breath  Cardiovascular: denies chest pain or palpitations  Gastrointestinal: + distension, denied abdominal pain, nausea, emesis Genitourinary: denies burning with urination or urinary frequency Musculoskeletal: denies pain, decreased motor or sensation  Vital signs in last 24 hours: [min-max] current  Temp:  [97.9 F (36.6 C)-98.9 F (37.2 C)] 98.1 F (36.7 C) (05/09 0403) Pulse Rate:  [77-106] 104 (05/09 0700) Resp:  [18-27] 27 (05/09 0700) BP: (146-185)/(60-77) 185/72 (05/09 0700) SpO2:  [90 %-96 %] 93 % (05/09 0700) Weight:  [71.1 kg] 71.1 kg (05/09 0403)     Height: 5\' 4"  (162.6 cm) Weight: 71.1 kg BMI (Calculated): 26.89   Intake/Output last 2 shifts:  05/08 0701 - 05/09 0700 In: 908.4 [I.V.:558.4; IV Piggyback:350] Out: 2150 [Urine:1300; Emesis/NG output:850]   Physical Exam:  Constitutional: alert, cooperative and no distress  HENT: normocephalic without obvious abnormality; NGT in place  Eyes: PERRL, EOM's grossly intact and symmetric  Respiratory: breathing non-labored at rest  Gastrointestinal: soft, no over tenderness, she is distended with tympany, no rebound/guarding, certainly without peritonitis   Labs:     Latest Ref Rng & Units 02/05/2023    3:20 AM 02/04/2023     4:11 AM 02/03/2023    5:47 PM  CBC  WBC 4.0 - 10.5 K/uL 13.3  13.8  16.0   Hemoglobin 12.0 - 15.0 g/dL 96.2  95.2  84.1   Hematocrit 36.0 - 46.0 % 30.8  34.2  37.5   Platelets 150 - 400 K/uL 180  186  198       Latest Ref Rng & Units 02/05/2023    3:20 AM 02/04/2023    4:11 AM 02/03/2023    5:47 PM  CMP  Glucose 70 - 99 mg/dL 324  401  027   BUN 8 - 23 mg/dL 15  23  25    Creatinine 0.44 - 1.00 mg/dL 2.53  6.64  4.03   Sodium 135 - 145 mmol/L 134  133  131   Potassium 3.5 - 5.1 mmol/L 3.5  3.8  3.8   Chloride 98 - 111 mmol/L 104  104  97   CO2 22 - 32 mmol/L 22  22  18    Calcium 8.9 - 10.3 mg/dL 7.7  8.0  8.6      Imaging studies:   KUB + CXR (02/05/2023) personally reviewed which shows now decompressed stomach with persistent dilated loops of small bowel, consistent with ileus, no free air, and radiologist report reviewed below:  IMPRESSION: 1. Slightly improved small bowel dilatation which may reflect ileus or partial obstruction. 2. Mildly worsened bilateral lung opacities concerning for pneumonia.   Assessment/Plan: (ICD-10's: K37.7) 82 y.o. female with post-operative ileus 3 Days Post-Op s/p percutaneous fixation of left femoral neck fracture.   - Continue NPO + IVF Resuscitation  - Would continue NGT decompression; LIS;  monitor and record output - Monitor abdominal examination; on-going bowel function - No indication for surgical intervention - Serial KUBs as needed    - Pain control prn; limit narcotics as feasible - Monitor electrolytes while NPO   - Mobilize as tolerated; okay to work with therapies as feasible    - Further management per primary service; we will follow    All of the above findings and recommendations were discussed with the patient, patient's family (daughter at bedside), and the medical team, and all of patient's and family's questions were answered to their expressed satisfaction.  -- Lynden Oxford, PA-C [facility] 02/05/2023, 7:57 AM M-F: 7am - 4pm

## 2023-02-05 NOTE — Consult Note (Signed)
ANTICOAGULATION CONSULT NOTE - Initial Consult  Pharmacy Consult for heparin infusion Indication: chest pain/ACS  Allergies  Allergen Reactions   Augmentin [Amoxicillin-Pot Clavulanate] Swelling and Rash    Swelling of the lips   Lexapro [Escitalopram Oxalate]    Micardis [Telmisartan] Itching   Myrbetriq [Mirabegron] Itching   Niacin And Related Itching   Codeine Sulfate Other (See Comments)    "Makes her hyper"   Vesicare [Solifenacin Succinate] Nausea And Vomiting and Rash    Patient Measurements: Height: 5\' 4"  (162.6 cm) Weight: 71.1 kg (156 lb 12 oz) IBW/kg (Calculated) : 54.7 Heparin Dosing Weight: 64.4 kg   Vital Signs: Temp: 99.7 F (37.6 C) (05/09 1126) Temp Source: Axillary (05/09 1126) BP: 175/69 (05/09 1632) Pulse Rate: 92 (05/09 1126)  Labs: Recent Labs    02/03/23 1747 02/03/23 2329 02/04/23 0411 02/04/23 1342 02/05/23 0050 02/05/23 0320 02/05/23 0952 02/05/23 1816  HGB 12.5  --  11.4*  --   --  10.4*  --   --   HCT 37.5  --  34.2*  --   --  30.8*  --   --   PLT 198  --  186  --   --  180  --   --   HEPARINUNFRC  --   --  <0.10*   < > 0.24*  --  0.39 0.37  CREATININE 1.11*  --  0.82  --   --  0.65  --   --   TROPONINIHS 15 17 18*  --   --   --   --   --    < > = values in this interval not displayed.     Estimated Creatinine Clearance: 51.6 mL/min (by C-G formula based on SCr of 0.65 mg/dL).   Medical History: Past Medical History:  Diagnosis Date   AK (actinic keratosis) 11/12/2022   left upper arm, tx'd with EDC   AK (actinic keratosis) 11/12/2022   right medial pretibia, LN2 11/25/22   Basal cell carcinoma 06/21/2020   left post shoulder sup, left mid chest, left lat thigh   Basal cell carcinoma 11/07/2021   L upper back, EDC   Basal cell carcinoma 11/07/2021   R upper back, EDC   Basal cell carcinoma 11/27/2021   right upper arm, EDC   Basal cell carcinoma 11/27/2021   right shoulder posterior, EDC   Basal cell carcinoma  11/27/2021   right anterior shoulder, EDC   Basal cell carcinoma (BCC) 06/13/2021   left dorsal foot, EDC 10/02/2021   Basal cell carcinoma (BCC) 06/13/2021   right postauricular tx'd w/ EDC   BCC (basal cell carcinoma of skin) 03/30/2007   R inf med pretibial - BCC   BCC (basal cell carcinoma of skin) 10/12/2013   L nasal ala - BCC   BCC (basal cell carcinoma of skin) 03/10/2018   L mid dorsum med forearm - superficial BCC    BCC (basal cell carcinoma) 10/02/2021   left dorsal foot proximal, EDC 10/02/2021   BCC (basal cell carcinoma) 10/02/2021   left mid back, superficial EDC 11/07/21   BCC (basal cell carcinoma) 10/02/2021   right pretibia   BCC (basal cell carcinoma) 10/02/2021   right mid back, superficial EDC 11/07/21   BCC (basal cell carcinoma) 08/14/2022   left distal calf, tx'd with EDC   BCC (basal cell carcinoma) 11/12/2022   superficial at left lateral pretibial,l tx'd with EDC   BCC (basal cell carcinoma) 11/12/2022   superficial at mid back  right of midline, scheduled for EDC   BCC (basal cell carcinoma) 11/12/2022   mid back right of midline, [facility] 11/25/22   Breast screening, unspecified 2013   Cancer (HCC) 1992   skin   Degenerative disk disease    GERD (gastroesophageal reflux disease)    History of basal cell carcinoma (BCC) 08/29/2020    left inferior knee lateral, , left inferior knee medial, right pretibia   History of SCC (squamous cell carcinoma) of skin 08/29/2020   right posterior calf, left lateral calf, and    Hypercholesterolemia 2008   Interstitial cystitis    followed by Dr Achilles Dunk   Other sign and symptom in breast 2013   Left upper outer quadrant breast "soreness" Ultrasound exam of right breast in the 2 o'clock position with the breast distracted medially showed a 0.3-0.4 with 0.5 cm simple cyst. In the 1 o'clock position where pt reported tenderness US exam was negatiive.  Because of her history of intermittent nipple drainage, ultrasound was completed  of the retroareolar area.   Personal history of tobacco use, presenting hazards to health    PONV (postoperative nausea and vomiting)    SCC (squamous cell carcinoma) 03/28/2008   R dorsum hand - SCC   SCC (squamous cell carcinoma) 05/09/2009   R lat lower leg - SCCIS   SCC (squamous cell carcinoma) 05/09/2009   L lat lower leg - SCCIS   SCC (squamous cell carcinoma) 04/07/2013   L lower leg - SCCIS   SCC (squamous cell carcinoma) 04/27/2013   R forearm - SCC   SCC (squamous cell carcinoma) 04/28/2017   L lat knee - SCCIS   SCC (squamous cell carcinoma) 05/12/2018   L prox dorsum forearm - SCC   SCC (squamous cell carcinoma) 06/13/2021   left forearm tx'd w/ EDC   SCC (squamous cell carcinoma), leg, right 03/30/2007   R sup pretibial - SCCIS   SCC (squamous cell carcinoma);BCC 10/12/2013   Mid back - superficial BCC with SCCIS    Special screening for malignant neoplasms, colon    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   left dorsal forearm, left lat calf   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 Last dose 5/7 @ 0900  Assessment: 82 y.o. female with a hx of hypertension, hyperlipidemia, aortic atherosclerosis, syncope, and recurrent falls, admitted 02/01/23 with left hip fracture 2/2 mechanical fall. Post-Op Day 1 Left femoral neck fracture fixation on 02/02/23. On 02/03/23 patient with new onset chest pain and new onset afib on EKG. Pharmacy has been consulted to initiate and manage IV heparin therapy.  Initial troponin 15.  Goal of Therapy:  Heparin level 0.3-0.7 units/ml Monitor platelets by anticoagulation protocol: Yes   5/8: HL @ 0411 = < 0.1, SUBtherapeutic 5/8: HL @ 1342 = < 0.1, SUBtherapeutic 5/9: HL @ 0050 =  0.24,  SUBtherapeutic 5/9: HL @ 0952 = 0.39, Therapeutic 5/9: HL  @ 1816 = 0.37  Plan: Heparin remains therapeutic  Continue heparin at 1400 un/hr Recheck HL with AM labs Continue to monitor H&H and platelets daily while on heparin gtt.  Sharen Hones, PharmD, BCPS Clinical Pharmacist   02/05/2023 6:57 PM

## 2023-02-05 NOTE — Progress Notes (Addendum)
Progress Note    [name]  ZOX:[id number] DOB: 02-19-1941  DOA: 02/01/2023 PCP: Dale Waterford, MD      Brief Narrative:    Medical records reviewed and are as summarized below:  Rachael Austin is a 82 y.o. female with a history significant for hypertension, interstitial cystitis, aortic atherosclerosis, syncope, recurrent falls, who presented to the hospital because of mechanical fall at home.   She was found to have left hip fracture.     Assessment/Plan:   Principal Problem:   Closed left hip fracture, initial encounter (HCC) Active Problems:   Hypercholesterolemia   Hypertensive urgency   Prolonged QT interval   Subcapital fracture of hip, left, closed, initial encounter (HCC)   PAF (paroxysmal atrial fibrillation) (HCC)   Ileus (HCC)   Ileus, postoperative (HCC)   Multifocal pneumonia   Acute respiratory failure with hypoxia (HCC)   Nutrition Problem: Increased nutrient needs Etiology: post-op healing  Signs/Symptoms: estimated needs   Body mass index is 26.91 kg/m.    Left hip fracture: S/p percutaneous fixation of left femoral neck hip fracture on 02/02/2023.   Multifocal pneumonia: Continue IV ceftriaxone and doxycycline   Acute hypoxic respiratory failure: Oxygen saturation dropped to 88% on room air when she was taken off oxygen.  Continue 2 L/min oxygen via [place] and taper off oxygen as able.   Ileus: Abdominal x-ray today showed slightly improved bowel dilatation.  She still has NG tube for gastric decompression.  850 mL of fluid was aspirated from NG tube overnight.  She is still NPO.  Continue IV fluids for hydration.   Chest pain/epigastric pain: Her pain is mostly in the epigastric region which could be from ileus.  No acute PE on CT chest.  No plan for ischemic workup.   Paroxysmal atrial fibrillation with RVR: She is in NSR.  She is on IV Cardizem infusion and IV heparin drip.  Follow-up with cardiologist for further  recommendations.  2D echo showed EF estimated at 65 to 70%, mild LVH, grade 1 diastolic dysfunction, mild to moderate TR   Low normal potassium of 3.5: Replete potassium intravenously with IV potassium chloride.   AKI: Creatinine is better.   Hypertensive urgency: BP still elevated.  Continue antihypertensives   Prolonged QTc interval: Improved from 500-454.  PT and OT reevaluation   Diet Order             Diet NPO time specified  Diet effective now                            Consultants: Orthopedic surgeon Cardiologist General surgeon  Procedures: Percutaneous fixation of left femoral neck fracture on 02/02/2023    Medications:    docusate sodium  100 mg Oral BID   feeding supplement  237 mL Oral BID BM   multivitamin with minerals  1 tablet Oral Daily   pantoprazole (PROTONIX) IV  40 mg Intravenous Q12H   pravastatin  20 mg Oral q1800   senna  1 tablet Oral BID   Continuous Infusions:  cefTRIAXone (ROCEPHIN)  IV 200 mL/hr at 02/05/23 0900   diltiazem (CARDIZEM) infusion 15 mg/hr (02/05/23 1232)   doxycycline (VIBRAMYCIN) IV 100 mg (02/05/23 1011)   heparin 1,400 Units/hr (02/05/23 1237)   lactated ringers 1,000 mL with potassium chloride 20 mEq infusion 75 mL/hr at 02/05/23 0546   methocarbamol (ROBAXIN) IV     potassium chloride  Anti-infectives (From admission, onward)    Start     Dose/Rate Route Frequency Ordered Stop   02/04/23 1200  doxycycline (VIBRAMYCIN) 100 mg in sodium chloride 0.9 % 250 mL IVPB        100 mg 125 mL/hr over 120 Minutes Intravenous 2 times daily 02/04/23 1105     02/03/23 2100  cefTRIAXone (ROCEPHIN) 1 g in sodium chloride 0.9 % 100 mL IVPB        1 g 200 mL/hr over 30 Minutes Intravenous Every 24 hours 02/03/23 1958 02/08/23 2159   02/02/23 2000  ceFAZolin (ANCEF) IVPB 2g/100 mL premix        2 g 200 mL/hr over 30 Minutes Intravenous Every 6 hours 02/02/23 1902 02/03/23 0851   02/02/23 0600  ceFAZolin  (ANCEF) IVPB 2g/100 mL premix        2 g 200 mL/hr over 30 Minutes Intravenous On call to O.R. 02/01/23 2236 02/02/23 1330              Family Communication/Anticipated D/C date and plan/Code Status   DVT prophylaxis: SCDs Start: 02/02/23 1902 Place TED hose Start: 02/02/23 1902 SCDs Start: 02/01/23 2015     Code Status: Full Code  Family Communication: Plan discussed with Annabelle Harman, daughter, at the bedside Disposition Plan: May be ready for discharge in about 4 days   Status is: Inpatient Remains inpatient appropriate because: Ileus, paroxysmal atrial fibrillation       Subjective:   Interval events noted.  She complains of cough productive of brownish sputum and epigastric pain.  No shortness of breath or chest pain.  Harrold Donath, RN and Annabelle Harman, patient's daughter, were at the bedside  Objective:    Vitals:   02/05/23 0700 02/05/23 0923 02/05/23 0931 02/05/23 1126  BP: (!) 185/72   (!) 178/68  Pulse: (!) 104   92  Resp: (!) 27   (!) 26  Temp:    99.7 F (37.6 C)  TempSrc:    Axillary  SpO2: 93% (!) 89% 91% 93%  Weight:      Height:       No data found.   Intake/Output Summary (Last 24 hours) at 02/05/2023 1248 Last data filed at 02/05/2023 0900 Gross per 24 hour  Intake 892.73 ml  Output 2150 ml  Net -1257.27 ml   Filed Weights   02/01/23 1756 02/04/23 0501 02/05/23 0403  Weight: 64.4 kg 71.4 kg 71.1 kg    Exam:   GEN: NAD SKIN: Warm and dry EYES: No pallor or icterus ENT: MMM, +NG tube CV: RRR PULM: CTA B ABD: soft, mildly distended, NT, +BS CNS: AAO x 3, non focal EXT: Incisional wound on left hip is clean, dry and intact.  Minimal swelling on the left hip    Data Reviewed:   I have personally reviewed following labs and imaging studies:  Labs: Labs show the following:   Basic Metabolic Panel: Recent Labs  Lab 02/02/23 0425 02/03/23 0519 02/03/23 1747 02/04/23 0411 02/05/23 0320  NA 138 134* 131* 133* 134*  K 3.7 4.0 3.8 3.8 3.5   CL 106 104 97* 104 104  CO2 22 21* 18* 22 22  GLUCOSE 175* 147* 150* 130* 120*  BUN 9 20 25* 23 15  CREATININE 0.72 1.35* 1.11* 0.82 0.65  CALCIUM 8.5* 8.5* 8.6* 8.0* 7.7*  MG 2.2  --   --   --  2.0   GFR Estimated Creatinine Clearance: 51.6 mL/min (by C-G formula based on SCr of 0.65  mg/dL). Liver Function Tests: No results for input(s): "AST", "ALT", "ALKPHOS", "BILITOT", "PROT", "ALBUMIN" in the last 168 hours. No results for input(s): "LIPASE", "AMYLASE" in the last 168 hours. No results for input(s): "AMMONIA" in the last 168 hours. Coagulation profile No results for input(s): "INR", "PROTIME" in the last 168 hours.  CBC: Recent Labs  Lab 02/01/23 1813 02/02/23 0425 02/03/23 0519 02/03/23 1747 02/04/23 0411 02/05/23 0320  WBC 7.2 11.2* 11.6* 16.0* 13.8* 13.3*  NEUTROABS 4.6  --   --   --   --   --   HGB 12.5 13.3 12.1 12.5 11.4* 10.4*  HCT 38.4 40.2 37.0 37.5 34.2* 30.8*  MCV 88.9 87.4 88.1 86.4 87.0 85.6  PLT 226 204 192 198 186 180   Cardiac Enzymes: No results for input(s): "CKTOTAL", "CKMB", "CKMBINDEX", "TROPONINI" in the last 168 hours. BNP (last 3 results) No results for input(s): "PROBNP" in the last 8760 hours. CBG: Recent Labs  Lab 02/03/23 1712 02/03/23 1916 02/03/23 2121  GLUCAP 148* 144* 136*   D-Dimer: No results for input(s): "DDIMER" in the last 72 hours. Hgb A1c: No results for input(s): "HGBA1C" in the last 72 hours. Lipid Profile: No results for input(s): "CHOL", "HDL", "LDLCALC", "TRIG", "CHOLHDL", "LDLDIRECT" in the last 72 hours. Thyroid function studies: No results for input(s): "TSH", "T4TOTAL", "T3FREE", "THYROIDAB" in the last 72 hours.  Invalid input(s): "FREET3" Anemia work up: No results for input(s): "VITAMINB12", "FOLATE", "FERRITIN", "TIBC", "IRON", "RETICCTPCT" in the last 72 hours. Sepsis Labs: Recent Labs  Lab 02/03/23 0519 02/03/23 1747 02/04/23 0411 02/05/23 0320  WBC 11.6* 16.0* 13.8* 13.3*     Microbiology No results found for this or any previous visit (from the past 240 hour(s)).  Procedures and diagnostic studies:  DG ABD ACUTE 2+V W 1V CHEST  Result Date: 02/05/2023 CLINICAL DATA:  Ileus. EXAM: DG ABDOMEN ACUTE WITH 1 VIEW CHEST COMPARISON:  Chest/abdominal radiographs 02/04/2023 FINDINGS: The cardiomediastinal silhouette is unchanged. Interstitial and patchy airspace opacities bilaterally, greatest in the perihilar regions, have mildly worsened. No sizable pleural effusion or pneumothorax is identified. An enteric tube has been placed and terminates in the expected region of the gastric body with side port also overlying the stomach. No intraperitoneal free air is identified. Mild dilatation of multiple gas-filled loops of small bowel has slightly improved. Gas and stool are present in nondilated colon. Fixation screws are noted in the proximal left femur. IMPRESSION: 1. Slightly improved small bowel dilatation which may reflect ileus or partial obstruction. 2. Mildly worsened bilateral lung opacities concerning for pneumonia. Electronically Signed   By: Sebastian Ache M.D.   On: 02/05/2023 08:50   DG ABD ACUTE 2+V W 1V CHEST  Result Date: 02/04/2023 CLINICAL DATA:  Gastric outlet obstruction. EXAM: DG ABDOMEN ACUTE WITH 1 VIEW CHEST COMPARISON:  Feb 01, 2023. FINDINGS: Mildly dilated small bowel loops are noted concerning for distal small bowel obstruction or ileus. Residual contrast is noted in urinary bladder. No radiopaque calculi or other significant radiographic abnormality is seen. Heart size and mediastinal contours are within normal limits. Mild bilateral perihilar opacities are noted concerning for pulmonary edema or inflammation, right greater than left. IMPRESSION: Dilated small bowel loops are noted concerning for distal small bowel obstruction or ileus. Bilateral lung opacities are noted, right greater than left, concerning for pulmonary edema or inflammation. Electronically  Signed   By: Lupita Raider M.D.   On: 02/04/2023 12:55   ECHOCARDIOGRAM COMPLETE  Result Date: 02/04/2023    ECHOCARDIOGRAM  REPORT   Patient Name:   Rachael Austin [facility] Date of Exam: 02/03/2023 Medical Rec #:  [id number]          Height:       64.0 in Accession #:    [id number]         Weight:       142.0 lb Date of Birth:  20-Mar-1941          BSA:          1.691 m Patient Age:    47 years           BP:           159/63 mmHg Patient Gender: F                  HR:           115 bpm. Exam Location:  [facility] Procedure: 2D Echo, Cardiac Doppler and Color Doppler Indications:     R07.9 Chest pain  History:         Patient has prior history of Echocardiogram examinations, most                  recent 11/21/2021.  Sonographer:     Daphine Deutscher RDCS Referring Phys:  [id number] CHRISTOPHER END Diagnosing Phys: Yvonne Kendall MD IMPRESSIONS  1. Left ventricular ejection fraction, by estimation, is 65 to 70%. The left ventricle has normal function. The left ventricle has no regional wall motion abnormalities. There is mild left ventricular hypertrophy. Left ventricular diastolic parameters are consistent with Grade I diastolic dysfunction (impaired relaxation). Elevated left atrial pressure.  2. Right ventricular systolic function is mildly reduced. The right ventricular size is normal. There is normal pulmonary artery systolic pressure.  3. The mitral valve is normal in structure. Trivial mitral valve regurgitation. No evidence of mitral stenosis.  4. Tricuspid valve regurgitation is mild to moderate.  5. The aortic valve has an indeterminant number of cusps. There is mild calcification of the aortic valve. There is mild thickening of the aortic valve. Aortic valve regurgitation is not visualized. Aortic valve sclerosis/calcification is present, without any evidence of aortic stenosis.  6. The inferior vena cava is normal in size with <50% respiratory variability, suggesting right atrial pressure of 8 mmHg. FINDINGS  Left  Ventricle: Left ventricular ejection fraction, by estimation, is 65 to 70%. The left ventricle has normal function. The left ventricle has no regional wall motion abnormalities. The left ventricular internal cavity size was normal in size. There is  mild left ventricular hypertrophy. Left ventricular diastolic parameters are consistent with Grade I diastolic dysfunction (impaired relaxation). Elevated left atrial pressure. Right Ventricle: The right ventricular size is normal. No increase in right ventricular wall thickness. Right ventricular systolic function is mildly reduced. There is normal pulmonary artery systolic pressure. The tricuspid regurgitant velocity is 2.50 m/s, and with an assumed right atrial pressure of 8 mmHg, the estimated right ventricular systolic pressure is 33.0 mmHg. Left Atrium: Left atrial size was normal in size. Right Atrium: Right atrial size was normal in size. Pericardium: There is no evidence of pericardial effusion. Mitral Valve: The mitral valve is normal in structure. Trivial mitral valve regurgitation. No evidence of mitral valve stenosis. Tricuspid Valve: The tricuspid valve is normal in structure. Tricuspid valve regurgitation is mild to moderate. Aortic Valve: The aortic valve has an indeterminant number of cusps. There is mild calcification of the aortic valve. There is mild thickening of the aortic valve. Aortic valve  regurgitation is not visualized. Aortic valve sclerosis/calcification is present, without any evidence of aortic stenosis. Aortic valve mean gradient measures 9.0 mmHg. Aortic valve peak gradient measures 11.4 mmHg. Aortic valve area, by VTI measures 2.05 cm. Pulmonic Valve: The pulmonic valve was normal in structure. Pulmonic valve regurgitation is trivial. No evidence of pulmonic stenosis. Aorta: The aortic root is normal in size and structure. Pulmonary Artery: The pulmonary artery is of normal size. Venous: The inferior vena cava is normal in size with  less than 50% respiratory variability, suggesting right atrial pressure of 8 mmHg. IAS/Shunts: The interatrial septum was not well visualized.  LEFT VENTRICLE PLAX 2D LVIDd:         3.40 cm   Diastology LVIDs:         2.30 cm   LV e' medial:    6.20 cm/s LV PW:         1.20 cm   LV E/e' medial:  17.3 LV IVS:        1.20 cm   LV e' lateral:   6.05 cm/s LVOT diam:     1.90 cm   LV E/e' lateral: 17.7 LV SV:         53 LV SV Index:   31 LVOT Area:     2.84 cm  RIGHT VENTRICLE             IVC RV Basal diam:  3.30 cm     IVC diam: 1.60 cm RV S prime:     10.22 cm/s LEFT ATRIUM             Index        RIGHT ATRIUM           Index LA diam:        4.20 cm 2.48 cm/m   RA Area:     10.90 cm LA Vol (A2C):   41.3 ml 24.42 ml/m  RA Volume:   25.70 ml  15.19 ml/m LA Vol (A4C):   36.5 ml 21.58 ml/m LA Biplane Vol: 39.2 ml 23.18 ml/m  AORTIC VALVE AV Area (Vmax):    1.83 cm AV Area (Vmean):   1.80 cm AV Area (VTI):     2.05 cm AV Vmax:           168.78 cm/s AV Vmean:          118.474 cm/s AV VTI:            0.259 m AV Peak Grad:      11.4 mmHg AV Mean Grad:      9.0 mmHg LVOT Vmax:         108.75 cm/s LVOT Vmean:        75.350 cm/s LVOT VTI:          0.188 m LVOT/AV VTI ratio: 0.72  AORTA Ao Root diam: 3.30 cm Ao Asc diam:  2.90 cm MITRAL VALVE                TRICUSPID VALVE MV Area (PHT): 2.93 cm     TR Peak grad:   25.0 mmHg MV Decel Time: 259 msec     TR Vmax:        250.00 cm/s MV E velocity: 107.14 cm/s MV A velocity: 144.50 cm/s  SHUNTS MV E/A ratio:  0.74         Systemic VTI:  0.19 m  Systemic Diam: 1.90 cm Yvonne Kendall MD Electronically signed by Yvonne Kendall MD Signature Date/Time: 02/04/2023/7:35:40 AM    Final    CT Angio Chest Pulmonary Embolism (PE) W or WO Contrast  Result Date: 02/03/2023 CLINICAL DATA:  High probability of pulmonary embolus. EXAM: CT ANGIOGRAPHY CHEST WITH CONTRAST TECHNIQUE: Multidetector CT imaging of the chest was performed using the standard protocol  during bolus administration of intravenous contrast. Multiplanar CT image reconstructions and MIPs were obtained to evaluate the vascular anatomy. RADIATION DOSE REDUCTION: This exam was performed according to the departmental dose-optimization program which includes automated exposure control, adjustment of the mA and/or kV according to patient size and/or use of iterative reconstruction technique. CONTRAST:  60mL OMNIPAQUE IOHEXOL 350 MG/ML SOLN COMPARISON:  None Available. FINDINGS: Cardiovascular: Satisfactory opacification of the pulmonary arteries to the segmental level. No evidence of pulmonary embolism. Normal heart size. No pericardial effusion. Coronary artery calcifications are noted. Mediastinum/Nodes: Thyroid gland is unremarkable. No adenopathy is noted. Severely dilated and fluid-filled esophagus is noted. Lungs/Pleura: No pneumothorax or pleural effusion is noted. Patchy airspace opacities are noted throughout the upper and lower lobes bilaterally most consistent with multifocal pneumonia. Upper Abdomen: Moderate to severe gastric distention is noted. Musculoskeletal: No chest wall abnormality. No acute or significant osseous findings. Review of the MIP images confirms the above findings. IMPRESSION: No definite evidence of pulmonary embolus. Patchy airspace opacities are noted throughout both lungs most consistent with multifocal pneumonia. Severely dilated and fluid-filled esophagus is noted as well as moderate to severe gastric distention seen in visualized portion of upper abdomen. This is concerning for gastric outlet obstruction. Coronary artery calcifications are noted suggesting coronary artery disease. Aortic Atherosclerosis (ICD10-I70.0). Electronically Signed   By: Lupita Raider M.D.   On: 02/03/2023 19:03               LOS: 4 days      [facility]   Pager on [email or web address]. If 7PM-7AM, please contact night-coverage at [email or web address]     02/05/2023,  12:48 PM

## 2023-02-06 ENCOUNTER — Inpatient Hospital Stay: Payer: Medicare PPO

## 2023-02-06 DIAGNOSIS — K567 Ileus, unspecified: Secondary | ICD-10-CM | POA: Diagnosis not present

## 2023-02-06 DIAGNOSIS — I160 Hypertensive urgency: Secondary | ICD-10-CM | POA: Diagnosis not present

## 2023-02-06 DIAGNOSIS — S72012A Unspecified intracapsular fracture of left femur, initial encounter for closed fracture: Secondary | ICD-10-CM | POA: Diagnosis not present

## 2023-02-06 DIAGNOSIS — I480 Paroxysmal atrial fibrillation: Secondary | ICD-10-CM | POA: Diagnosis not present

## 2023-02-06 DIAGNOSIS — E876 Hypokalemia: Secondary | ICD-10-CM | POA: Diagnosis not present

## 2023-02-06 DIAGNOSIS — J9601 Acute respiratory failure with hypoxia: Secondary | ICD-10-CM

## 2023-02-06 DIAGNOSIS — S72002A Fracture of unspecified part of neck of left femur, initial encounter for closed fracture: Secondary | ICD-10-CM | POA: Diagnosis not present

## 2023-02-06 LAB — CBC WITH DIFFERENTIAL/PLATELET
Abs Immature Granulocytes: 0.73 10*3/uL — ABNORMAL HIGH (ref 0.00–0.07)
Basophils Absolute: 0.1 10*3/uL (ref 0.0–0.1)
Basophils Relative: 1 %
Eosinophils Absolute: 0.1 10*3/uL (ref 0.0–0.5)
Eosinophils Relative: 1 %
HCT: 30.8 % — ABNORMAL LOW (ref 36.0–46.0)
Hemoglobin: 10.3 g/dL — ABNORMAL LOW (ref 12.0–15.0)
Immature Granulocytes: 5 %
Lymphocytes Relative: 8 %
Lymphs Abs: 1.1 10*3/uL (ref 0.7–4.0)
MCH: 28.9 pg (ref 26.0–34.0)
MCHC: 33.4 g/dL (ref 30.0–36.0)
MCV: 86.3 fL (ref 80.0–100.0)
Monocytes Absolute: 1.3 10*3/uL — ABNORMAL HIGH (ref 0.1–1.0)
Monocytes Relative: 9 %
Neutro Abs: 10.6 10*3/uL — ABNORMAL HIGH (ref 1.7–7.7)
Neutrophils Relative %: 76 %
Platelets: 231 10*3/uL (ref 150–400)
RBC: 3.57 MIL/uL — ABNORMAL LOW (ref 3.87–5.11)
RDW: 14.8 % (ref 11.5–15.5)
WBC: 13.9 10*3/uL — ABNORMAL HIGH (ref 4.0–10.5)
nRBC: 0 % (ref 0.0–0.2)

## 2023-02-06 LAB — BASIC METABOLIC PANEL
Anion gap: 8 (ref 5–15)
BUN: 12 mg/dL (ref 8–23)
CO2: 21 mmol/L — ABNORMAL LOW (ref 22–32)
Calcium: 7.7 mg/dL — ABNORMAL LOW (ref 8.9–10.3)
Chloride: 105 mmol/L (ref 98–111)
Creatinine, Ser: 0.64 mg/dL (ref 0.44–1.00)
GFR, Estimated: >60 mL/min (ref >60)
Glucose, Bld: 110 mg/dL — ABNORMAL HIGH (ref 70–99)
Potassium: 3.2 mmol/L — ABNORMAL LOW (ref 3.5–5.1)
Sodium: 134 mmol/L — ABNORMAL LOW (ref 135–145)

## 2023-02-06 LAB — MAGNESIUM: Magnesium: 2.1 mg/dL (ref 1.7–2.4)

## 2023-02-06 LAB — HEPARIN LEVEL (UNFRACTIONATED): Heparin Unfractionated: 0.28 IU/mL — ABNORMAL LOW (ref 0.30–0.70)

## 2023-02-06 MED ORDER — POTASSIUM CHLORIDE IN NACL 40-0.9 MEQ/L-% IV SOLN
INTRAVENOUS | Status: DC
  Filled 2023-02-06 (×5): qty 1000

## 2023-02-06 MED ORDER — POTASSIUM CHLORIDE CRYS ER 20 MEQ PO TBCR: 40.0000 meq | EXTENDED_RELEASE_TABLET | Freq: Two times a day (BID) | ORAL | Status: DC

## 2023-02-06 MED ORDER — ENOXAPARIN SODIUM 80 MG/0.8ML IJ SOSY
1.0000 mg/kg | PREFILLED_SYRINGE | Freq: Two times a day (BID) | INTRAMUSCULAR | Status: DC
  Administered 2023-02-06 – 2023-02-10 (×8): 75 mg via SUBCUTANEOUS
  Filled 2023-02-06 (×6): qty 0.8

## 2023-02-06 MED ORDER — LABETALOL HCL 5 MG/ML IV SOLN
10.0000 mg | Freq: Four times a day (QID) | INTRAVENOUS | Status: DC
  Administered 2023-02-06 – 2023-02-10 (×16): 10 mg via INTRAVENOUS
  Filled 2023-02-06 (×16): qty 4

## 2023-02-06 MED ORDER — POTASSIUM CHLORIDE IN NACL 40-0.9 MEQ/L-% IV SOLN
INTRAVENOUS | Status: DC
  Filled 2023-02-06: qty 1000

## 2023-02-06 MED ORDER — HEPARIN BOLUS VIA INFUSION
950.0000 [IU] | Freq: Once | INTRAVENOUS | Status: AC
  Administered 2023-02-06: 950 [IU] via INTRAVENOUS
  Filled 2023-02-06: qty 950

## 2023-02-06 NOTE — TOC Progression Note (Signed)
Transition of Care [facility]) - Progression Note    Patient Details  Name: Rachael Austin MRN: [id number] Date of Birth: [birth year]  [facility] CM/SW Contact  Truddie Hidden, RN Phone Number: 02/06/2023, 3:26 PM  Clinical Narrative:    Patient has bed offers for [facility], [facility], [facility], [facility] and [facility]. Will reattempt to give bed offers at later time.   Expected Discharge Plan: Skilled Nursing Facility Barriers to Discharge: No Barriers Identified  Expected Discharge Plan and Services   Discharge Planning Services: CM Consult   Living arrangements for the past 2 months: Single Family Home                                       Social Determinants of Health (SDOH) Interventions SDOH Screenings   Depression (PHQ2-9): Low Risk  (06/26/2022)  Tobacco Use: Medium Risk (02/04/2023)    Readmission Risk Interventions     No data to display

## 2023-02-06 NOTE — Progress Notes (Signed)
Rounding Note    Patient Name: Rachael Austin Date of Encounter: 02/06/2023  [facility] Cardiologist: [facility]-  Subjective   Reports feeling stable, some fullness in her abdomen, mildly distended, no flatus or bowel movements NG tube remains to suction, surgery following  KUB with persistent loops of small bowel dilation  Remains on diltiazem infusion 15 mg/h Yesterday started on hydralazine 10 mg IV every 6 hours for hypertension  Blood pressure and heart rate remain elevated, maintaining normal sinus rhythm  Inpatient Medications    Scheduled Meds:  docusate sodium  100 mg Oral BID   enoxaparin (LOVENOX) injection  1 mg/kg Subcutaneous Q12H   feeding supplement  237 mL Oral BID BM   hydrALAZINE  10 mg Intravenous Q6H   labetalol  10 mg Intravenous Q6H   multivitamin with minerals  1 tablet Oral Daily   pantoprazole (PROTONIX) IV  40 mg Intravenous Q12H   pravastatin  20 mg Oral q1800   senna  1 tablet Oral BID   Continuous Infusions:  0.9 % NaCl with KCl 40 mEq / L     cefTRIAXone (ROCEPHIN)  IV 1 g (02/05/23 2214)   diltiazem (CARDIZEM) infusion 15 mg/hr (02/06/23 1030)   doxycycline (VIBRAMYCIN) IV 100 mg (02/06/23 1013)   methocarbamol (ROBAXIN) IV 500 mg (02/05/23 2104)   PRN Meds: acetaminophen, alum & mag hydroxide-simeth, bisacodyl, HYDROcodone-acetaminophen, labetalol, menthol-cetylpyridinium **OR** phenol, methocarbamol **OR** methocarbamol (ROBAXIN) IV, morphine injection, mouth rinse, polyethylene glycol, prochlorperazine   Vital Signs    Vitals:   02/06/23 1025 02/06/23 1100 02/06/23 1150 02/06/23 1222  BP: (!) 166/56 (!) 163/61 (!) 131/49   Pulse:  (!) 111 87   Resp:  (!) 33    Temp:   97.9 F (36.6 C) 98.5 F (36.9 C)  TempSrc:   Oral   SpO2:  (!) 89% 91%   Weight:      Height:        Intake/Output Summary (Last 24 hours) at 02/06/2023 1349 Last data filed at 02/06/2023 0520 Gross per 24 hour  Intake 753 ml  Output 1950  ml  Net -1197 ml      02/06/2023    5:03 AM 02/05/2023    4:03 AM 02/04/2023    5:01 AM  Last 3 Weights  Weight (lbs) 163 lb 5.8 oz 156 lb 12 oz 157 lb 6.5 oz  Weight (kg) 74.1 kg 71.1 kg 71.4 kg      Telemetry    Sinus tachycardia- Personally Reviewed  ECG     - Personally Reviewed  Physical Exam   GEN: Sleeping Neck: No JVD Cardiac: RRR, no murmurs, rubs, or gallops.  Respiratory: Clear to auscultation bilaterally. GI: Mildly distended tender on palpation MS: No edema; No deformity. Neuro:  Nonfocal  Psych: Sleeping  Labs    High Sensitivity Troponin:   Recent Labs  Lab 02/03/23 1747 02/03/23 2329 02/04/23 0411  TROPONINIHS 15 17 18*     Chemistry Recent Labs  Lab 02/02/23 0425 02/03/23 0519 02/04/23 0411 02/05/23 0320 02/06/23 0455  NA 138   < > 133* 134* 134*  K 3.7   < > 3.8 3.5 3.2*  CL 106   < > 104 104 105  CO2 22   < > 22 22 21*  GLUCOSE 175*   < > 130* 120* 110*  BUN 9   < > 23 15 12   CREATININE 0.72   < > 0.82 0.65 0.64  CALCIUM 8.5*   < >  8.0* 7.7* 7.7*  MG 2.2  --   --  2.0 2.1  GFRNONAA >60   < > >60 >60 >60  ANIONGAP 10   < > 7 8 8    < > = values in this interval not displayed.    Lipids No results for input(s): "CHOL", "TRIG", "HDL", "LABVLDL", "LDLCALC", "CHOLHDL" in the last 168 hours.  Hematology Recent Labs  Lab 02/04/23 0411 02/05/23 0320 02/06/23 0455  WBC 13.8* 13.3* 13.9*  RBC 3.93 3.60* 3.57*  HGB 11.4* 10.4* 10.3*  HCT 34.2* 30.8* 30.8*  MCV 87.0 85.6 86.3  MCH 29.0 28.9 28.9  MCHC 33.3 33.8 33.4  RDW 14.7 14.8 14.8  PLT 186 180 231   Thyroid No results for input(s): "TSH", "FREET4" in the last 168 hours.  BNPNo results for input(s): "BNP", "PROBNP" in the last 168 hours.  DDimer No results for input(s): "DDIMER" in the last 168 hours.   Radiology    DG ABD ACUTE 2+V W 1V CHEST  Result Date: 02/06/2023 CLINICAL DATA:  Ileus following hip surgery EXAM: DG ABDOMEN ACUTE WITH 1 VIEW CHEST COMPARISON:  02/05/2023  FINDINGS: Nasogastric tube extends into stomach. Enlargement of cardiac silhouette. Patchy BILATERAL pulmonary infiltrates favor multifocal pneumonia. No pleural effusion or pneumothorax. Air-filled bowel loops throughout abdomen consistent with a combination of nondistended colon a few mildly distended small bowel loops. Stool present in rectum. No bowel wall thickening or free air. Osseous demineralization with prior pinning of LEFT hip. IMPRESSION: Few dilated air-filled small bowel loops favoring postop ileus. Increased patchy BILATERAL pulmonary infiltrates favoring multifocal pneumonia. Electronically Signed   By: Ulyses Southward M.D.   On: 02/06/2023 08:50   DG ABD ACUTE 2+V W 1V CHEST  Result Date: 02/05/2023 CLINICAL DATA:  Ileus. EXAM: DG ABDOMEN ACUTE WITH 1 VIEW CHEST COMPARISON:  Chest/abdominal radiographs 02/04/2023 FINDINGS: The cardiomediastinal silhouette is unchanged. Interstitial and patchy airspace opacities bilaterally, greatest in the perihilar regions, have mildly worsened. No sizable pleural effusion or pneumothorax is identified. An enteric tube has been placed and terminates in the expected region of the gastric body with side port also overlying the stomach. No intraperitoneal free air is identified. Mild dilatation of multiple gas-filled loops of small bowel has slightly improved. Gas and stool are present in nondilated colon. Fixation screws are noted in the proximal left femur. IMPRESSION: 1. Slightly improved small bowel dilatation which may reflect ileus or partial obstruction. 2. Mildly worsened bilateral lung opacities concerning for pneumonia. Electronically Signed   By: Sebastian Ache M.D.   On: 02/05/2023 08:50    Cardiac Studies   Echocardiogram EF 65 to 70%, mildly reduced RV function, TR mild to moderate  Patient Profile     82 y.o. female with h/o HTN, HLD, aortic atherosclerosis, syncope, recurrent falls who is being seen for new afib and chest pain.   Assessment &  Plan    A/P: Paroxysmal atrial fibrillation In the setting of a fall presenting with hypertensive urgency systolic pressure 200, chest pain, aspiration pneumonia, ileus on IV fluids, status post left hip fracture surgery Maintaining normal sinus rhythm past 48 hours on diltiazem infusion Recommend we continue 15 mg/h for blood pressure and rhythm control Will continue infusion as she is n.p.o. with NG tube in place -High risk of recurrent arrhythmia in the setting of above   Chest pain CT scan negative for PE Atypical features following a fall No plan for ischemic workup at this time In hindsight symptoms likely secondary to  ileus with distended stomach, symptoms improved with NG tube to suction   Left hip fracture, After mechanical fall Percutaneous fixation of left femoral neck POD #2  Malignant hypertension Tolerating diltiazem 15 mg/h, blood pressure remains high Also on hydralazine 10 mg IV every 6 hours  Will add labetalol 10 mg IV every 6 hours, close monitoring of heart rate and blood pressure   Case discussed with patient, family at bedside, pharmacy  Total encounter time more than 50 minutes  Greater than 50% was spent in counseling and coordination of care with the patient   For questions or updates, please contact [facility] Please consult [email or web address] for contact info under        Signed, Julien Nordmann, MD  02/06/2023, 1:49 PM

## 2023-02-06 NOTE — Progress Notes (Signed)
Progress Note    Rachael Austin  ZOX:[id number] DOB: 16-Apr-1941  DOA: 02/01/2023 PCP: Dale [place], MD      Brief Narrative:    Medical records reviewed and are as summarized below:  Rachael Austin is a 82 y.o. female with a history significant for hypertension, interstitial cystitis, aortic atherosclerosis, syncope, recurrent falls, who presented to the hospital because of mechanical fall at home.   She was found to have left hip fracture.     Assessment/Plan:   Principal Problem:   Closed left hip fracture, initial encounter (HCC) Active Problems:   Hypercholesterolemia   Hypertensive urgency   Prolonged QT interval   Subcapital fracture of hip, left, closed, initial encounter (HCC)   PAF (paroxysmal atrial fibrillation) (HCC)   Ileus (HCC)   Ileus, postoperative (HCC)   Multifocal pneumonia   Acute respiratory failure with hypoxia (HCC)   Hypokalemia   Nutrition Problem: Increased nutrient needs Etiology: post-op healing  Signs/Symptoms: estimated needs   Body mass index is 28.04 kg/m.    Left hip fracture: S/p percutaneous fixation of left femoral neck hip fracture on 02/02/2023.   Multifocal pneumonia: Continue empiric IV antibiotics (ceftriaxone and doxycycline)   Acute hypoxic respiratory failure: She is on 3 L/min oxygen via [place]   Ileus: NG tube is still in place.  She is still NPO.  Restart IV fluids.  Use normal saline with 40 mEq of potassium chloride because of hypokalemia.    Chest pain/epigastric pain: Her pain is mostly in the epigastric region which could be from ileus.  No acute PE on CT chest.  No plan for ischemic workup.   Paroxysmal atrial fibrillation with RVR: Telemetry shows sinus tachycardia.  Discussed with Dr. Mariah Milling, cardiologist.  He recommended keeping IV Cardizem infusion and IV heparin for now.   2D echo showed EF estimated at 65 to 70%, mild LVH, grade 1 diastolic dysfunction, mild to moderate  TR   Hypokalemia: Replete with NS infusion with 40 mEq of potassium chloride.  Monitor potassium and replete as needed   AKI: Creatinine is better.   Hypertensive urgency, hypertension: Continue IV Cardizem   Prolonged QTc interval: Improved from 500-454.   Continue PT and OT   Diet Order             Diet NPO time specified  Diet effective now                            Consultants: Orthopedic surgeon Cardiologist General surgeon  Procedures: Percutaneous fixation of left femoral neck fracture on 02/02/2023    Medications:    docusate sodium  100 mg Oral BID   enoxaparin (LOVENOX) injection  1 mg/kg Subcutaneous Q12H   feeding supplement  237 mL Oral BID BM   hydrALAZINE  10 mg Intravenous Q6H   labetalol  10 mg Intravenous Q6H   multivitamin with minerals  1 tablet Oral Daily   pantoprazole (PROTONIX) IV  40 mg Intravenous Q12H   pravastatin  20 mg Oral q1800   senna  1 tablet Oral BID   Continuous Infusions:  0.9 % NaCl with KCl 40 mEq / L     cefTRIAXone (ROCEPHIN)  IV 1 g (02/05/23 2214)   diltiazem (CARDIZEM) infusion 15 mg/hr (02/06/23 1030)   doxycycline (VIBRAMYCIN) IV 100 mg (02/06/23 1013)   methocarbamol (ROBAXIN) IV 500 mg (02/05/23 2104)     Anti-infectives (From admission, onward)  Start     Dose/Rate Route Frequency Ordered Stop   02/04/23 1200  doxycycline (VIBRAMYCIN) 100 mg in sodium chloride 0.9 % 250 mL IVPB        100 mg 125 mL/hr over 120 Minutes Intravenous 2 times daily 02/04/23 1105 02/09/23 0959   02/03/23 2100  cefTRIAXone (ROCEPHIN) 1 g in sodium chloride 0.9 % 100 mL IVPB        1 g 200 mL/hr over 30 Minutes Intravenous Every 24 hours 02/03/23 1958 02/08/23 2159   02/02/23 2000  ceFAZolin (ANCEF) IVPB 2g/100 mL premix        2 g 200 mL/hr over 30 Minutes Intravenous Every 6 hours 02/02/23 1902 02/03/23 0851   02/02/23 0600  ceFAZolin (ANCEF) IVPB 2g/100 mL premix        2 g 200 mL/hr over 30 Minutes  Intravenous On call to O.R. 02/01/23 2236 02/02/23 1330              Family Communication/Anticipated D/C date and plan/Code Status   DVT prophylaxis: SCDs Start: 02/02/23 1902 Place TED hose Start: 02/02/23 1902 SCDs Start: 02/01/23 2015     Code Status: Full Code  Family Communication: Plan discussed with Annabelle Harman, daughter, at the bedside Disposition Plan: May be ready for discharge in about 4 to 5 days   Status is: Inpatient Remains inpatient appropriate because: Ileus, paroxysmal atrial fibrillation       Subjective:   Interval events noted.  She complains of abdominal pain and cough.  No shortness of breath.  She has passed some gas but no bowel movement.  Annabelle Harman, daughter, was at the bedside  Objective:    Vitals:   02/06/23 1025 02/06/23 1100 02/06/23 1150 02/06/23 1222  BP: (!) 166/56 (!) 163/61 (!) 131/49   Pulse:  (!) 111 87   Resp:  (!) 33    Temp:   97.9 F (36.6 C) 98.5 F (36.9 C)  TempSrc:   Oral   SpO2:  (!) 89% 91%   Weight:      Height:       No data found.   Intake/Output Summary (Last 24 hours) at 02/06/2023 1345 Last data filed at 02/06/2023 0520 Gross per 24 hour  Intake 753 ml  Output 1950 ml  Net -1197 ml   Filed Weights   02/04/23 0501 02/05/23 0403 02/06/23 0503  Weight: 71.4 kg 71.1 kg 74.1 kg    Exam:  GEN: NAD SKIN: Warm and dry EYES: No pallor or icterus ENT: MMM, + NG-tube to intermittent low wall suction (dark fluid in canister). CV: RRR PULM: CTA B ABD: soft, distended, NT, +BS CNS: AAO x 3, non focal EXT: Left hip surgical wound is clean, dry and intact.   Data Reviewed:   I have personally reviewed following labs and imaging studies:  Labs: Labs show the following:   Basic Metabolic Panel: Recent Labs  Lab 02/02/23 0425 02/03/23 0519 02/03/23 1747 02/04/23 0411 02/05/23 0320 02/06/23 0455  NA 138 134* 131* 133* 134* 134*  K 3.7 4.0 3.8 3.8 3.5 3.2*  CL 106 104 97* 104 104 105  CO2 22 21*  18* 22 22 21*  GLUCOSE 175* 147* 150* 130* 120* 110*  BUN 9 20 25* 23 15 12   CREATININE 0.72 1.35* 1.11* 0.82 0.65 0.64  CALCIUM 8.5* 8.5* 8.6* 8.0* 7.7* 7.7*  MG 2.2  --   --   --  2.0 2.1   GFR Estimated Creatinine Clearance: 52.6 mL/min (by C-G  formula based on SCr of 0.64 mg/dL). Liver Function Tests: No results for input(s): "AST", "ALT", "ALKPHOS", "BILITOT", "PROT", "ALBUMIN" in the last 168 hours. No results for input(s): "LIPASE", "AMYLASE" in the last 168 hours. No results for input(s): "AMMONIA" in the last 168 hours. Coagulation profile No results for input(s): "INR", "PROTIME" in the last 168 hours.  CBC: Recent Labs  Lab 02/01/23 1813 02/02/23 0425 02/03/23 0519 02/03/23 1747 02/04/23 0411 02/05/23 0320 02/06/23 0455  WBC 7.2   < > 11.6* 16.0* 13.8* 13.3* 13.9*  NEUTROABS 4.6  --   --   --   --   --  10.6*  HGB 12.5   < > 12.1 12.5 11.4* 10.4* 10.3*  HCT 38.4   < > 37.0 37.5 34.2* 30.8* 30.8*  MCV 88.9   < > 88.1 86.4 87.0 85.6 86.3  PLT 226   < > 192 198 186 180 231   < > = values in this interval not displayed.   Cardiac Enzymes: No results for input(s): "CKTOTAL", "CKMB", "CKMBINDEX", "TROPONINI" in the last 168 hours. BNP (last 3 results) No results for input(s): "PROBNP" in the last 8760 hours. CBG: Recent Labs  Lab 02/03/23 1712 02/03/23 1916 02/03/23 2121  GLUCAP 148* 144* 136*   D-Dimer: No results for input(s): "DDIMER" in the last 72 hours. Hgb A1c: No results for input(s): "HGBA1C" in the last 72 hours. Lipid Profile: No results for input(s): "CHOL", "HDL", "LDLCALC", "TRIG", "CHOLHDL", "LDLDIRECT" in the last 72 hours. Thyroid function studies: No results for input(s): "TSH", "T4TOTAL", "T3FREE", "THYROIDAB" in the last 72 hours.  Invalid input(s): "FREET3" Anemia work up: No results for input(s): "VITAMINB12", "FOLATE", "FERRITIN", "TIBC", "IRON", "RETICCTPCT" in the last 72 hours. Sepsis Labs: Recent Labs  Lab 02/03/23 1747  02/04/23 0411 02/05/23 0320 02/06/23 0455  WBC 16.0* 13.8* 13.3* 13.9*    Microbiology No results found for this or any previous visit (from the past 240 hour(s)).  Procedures and diagnostic studies:  DG ABD ACUTE 2+V W 1V CHEST  Result Date: 02/06/2023 CLINICAL DATA:  Ileus following hip surgery EXAM: DG ABDOMEN ACUTE WITH 1 VIEW CHEST COMPARISON:  02/05/2023 FINDINGS: Nasogastric tube extends into stomach. Enlargement of cardiac silhouette. Patchy BILATERAL pulmonary infiltrates favor multifocal pneumonia. No pleural effusion or pneumothorax. Air-filled bowel loops throughout abdomen consistent with a combination of nondistended colon a few mildly distended small bowel loops. Stool present in rectum. No bowel wall thickening or free air. Osseous demineralization with prior pinning of LEFT hip. IMPRESSION: Few dilated air-filled small bowel loops favoring postop ileus. Increased patchy BILATERAL pulmonary infiltrates favoring multifocal pneumonia. Electronically Signed   By: Ulyses Southward M.D.   On: 02/06/2023 08:50   DG ABD ACUTE 2+V W 1V CHEST  Result Date: 02/05/2023 CLINICAL DATA:  Ileus. EXAM: DG ABDOMEN ACUTE WITH 1 VIEW CHEST COMPARISON:  Chest/abdominal radiographs 02/04/2023 FINDINGS: The cardiomediastinal silhouette is unchanged. Interstitial and patchy airspace opacities bilaterally, greatest in the perihilar regions, have mildly worsened. No sizable pleural effusion or pneumothorax is identified. An enteric tube has been placed and terminates in the expected region of the gastric body with side port also overlying the stomach. No intraperitoneal free air is identified. Mild dilatation of multiple gas-filled loops of small bowel has slightly improved. Gas and stool are present in nondilated colon. Fixation screws are noted in the proximal left femur. IMPRESSION: 1. Slightly improved small bowel dilatation which may reflect ileus or partial obstruction. 2. Mildly worsened bilateral lung  opacities concerning  for pneumonia. Electronically Signed   By: Sebastian Ache M.D.   On: 02/05/2023 08:50               LOS: 5 days      [facility]   Pager on [email or web address]. If 7PM-7AM, please contact night-coverage at [email or web address]     02/06/2023, 1:45 PM

## 2023-02-06 NOTE — Progress Notes (Signed)
Physical Therapy Treatment Patient Details Name: Rachael Austin MRN: [id number] DOB: 1941-05-01 Today's Date: 02/06/2023   History of Present Illness Pt is an 82 y/o F admitted on 02/01/23 after presenting with c/o a fall. Pt found to have L impacted femoral neck hip fx & underwent percutaneous fixation on 02/02/23. PMH: HTN, interstitial cystitis, DDD, GERD, hypercholesterolemia. Found to have post-operative ileus    PT Comments    Pt was pleasant and motivated to participate during the session and put forth good effort throughout. Pt required no physical assistance during sup to sit with HOB elevated but did require significant time, effort, and use of bed rail.  Pt did require assist to go from sitting to sup mostly for BLE control.  Once in sitting pt reported mild dizziness with seated BP taken at 163/65 compared to recent supine BP of 159/67.  Pt was able to stand with +2 assist with good LLE WB compliance and was able to do some very small, shuffling steps with her RLE before needing to return to sitting.  No L hip pain noted during the session with SpO2 and HR WNL on O2.  Pt will benefit from continued PT services upon discharge to safely address deficits listed in patient problem list for decreased caregiver assistance and eventual return to PLOF.     Recommendations for follow up therapy are one component of a multi-disciplinary discharge planning process, led by the attending physician.  Recommendations may be updated based on patient status, additional functional criteria and insurance authorization.  Follow Up Recommendations  Can patient physically be transported by private vehicle: No    Assistance Recommended at Discharge Frequent or constant Supervision/Assistance  Patient can return home with the following A lot of help with walking and/or transfers;A lot of help with bathing/dressing/bathroom;Assist for transportation;Assistance with cooking/housework;Direct supervision/assist  for financial management;Help with stairs or ramp for entrance;Direct supervision/assist for medications management   Equipment Recommendations  Other (comment) (TBD at next venue of care)    Recommendations for Other Services       Precautions / Restrictions Precautions Precautions: Fall Restrictions Weight Bearing Restrictions: Yes LLE Weight Bearing: Partial weight bearing LLE Partial Weight Bearing Percentage or Pounds: 25%     Mobility  Bed Mobility Overal bed mobility: Needs Assistance Bed Mobility: Supine to Sit, Sit to Supine     Supine to sit: Supervision Sit to supine: Mod assist   General bed mobility comments: Extra time, effort, and use of bed rail only during sup to sit but mod A for BLE and trunk control during sit to sup    Transfers Overall transfer level: Needs assistance Equipment used: Rolling walker (2 wheels) Transfers: Sit to/from Stand Sit to Stand: Min assist, +2 physical assistance           General transfer comment: Mod verbal cues for sequencing for WB compliance    Ambulation/Gait Ambulation/Gait assistance: Min guard Gait Distance (Feet): 1 Feet Assistive device: Rolling walker (2 wheels) Gait Pattern/deviations: Shuffle Gait velocity: decreased     General Gait Details: Pt able to shuffle on the RLE around 1 foot at the EOB with L heel up from the floor to ensure WB compliance   Stairs             Wheelchair Mobility    Modified Rankin (Stroke Patients Only)       Balance Overall balance assessment: Needs assistance Sitting-balance support: Feet supported, Bilateral upper extremity supported Sitting balance-Leahy Scale: Good  Standing balance support: Bilateral upper extremity supported, During functional activity Standing balance-Leahy Scale: Fair                              Cognition Arousal/Alertness: Awake/alert Behavior During Therapy: WFL for tasks assessed/performed Overall Cognitive  Status: Within Functional Limits for tasks assessed                                          Exercises Total Joint Exercises Ankle Circles/Pumps: AROM, Strengthening, Both, 10 reps Quad Sets: Strengthening, Both, 10 reps Gluteal Sets: Strengthening, Both, 10 reps Hip ABduction/ADduction: AAROM, Strengthening, Both, 5 reps Straight Leg Raises: AAROM, Strengthening, Both, 5 reps Long Arc Quad: AROM, Strengthening, Both, 15 reps Knee Flexion: AROM, Strengthening, Both, 15 reps Other Exercises Other Exercises: HEP education for BLE APs, QS, and GS x 10 each every 1-2 hours daily    General Comments        Pertinent Vitals/Pain Pain Assessment Pain Assessment: 0-10 Pain Score: 4  Pain Location: abdomen, no L hip pain Pain Descriptors / Indicators: Sore Pain Intervention(s): Repositioned, Premedicated before session, Monitored during session    Home Living                          Prior Function            PT Goals (current goals can now be found in the care plan section) Progress towards PT goals: Progressing toward goals    Frequency    7X/week      PT Plan Current plan remains appropriate    Co-evaluation              AM-PAC PT "6 Clicks" Mobility   Outcome Measure  Help needed turning from your back to your side while in a flat bed without using bedrails?: A Little Help needed moving from lying on your back to sitting on the side of a flat bed without using bedrails?: A Lot Help needed moving to and from a bed to a chair (including a wheelchair)?: A Lot Help needed standing up from a chair using your arms (e.g., wheelchair or bedside chair)?: A Lot Help needed to walk in hospital room?: Total Help needed climbing 3-5 steps with a railing? : Total 6 Click Score: 11    End of Session Equipment Utilized During Treatment: Gait belt;Oxygen Activity Tolerance: Patient tolerated treatment well Patient left: in bed;with call  bell/phone within reach;with bed alarm set;with SCD's reapplied;with family/visitor present Nurse Communication: Mobility status;Weight bearing status PT Visit Diagnosis: Difficulty in walking, not elsewhere classified (R26.2);Other abnormalities of gait and mobility (R26.89);Muscle weakness (generalized) (M62.81)     Time: 1610-9604 PT Time Calculation (min) (ACUTE ONLY): 34 min  Charges:  $Therapeutic Exercise: 8-22 mins $Therapeutic Activity: 8-22 mins                     D. Scott  PT, DPT 02/06/23, 3:30 PM

## 2023-02-06 NOTE — Care Management Important Message (Signed)
Important Message  Patient Details  Name: Rachael Austin MRN: [id number] Date of Birth: [birth year]   Medicare Important Message Given:  Yes     Olegario Messier A  02/06/2023, 3:15 PM

## 2023-02-06 NOTE — Progress Notes (Signed)
[facility] SURGICAL PROGRESS NOTE (cpt [phone])  Hospital Day(s): 5.   Post op day(s): 4 Days Post-Op.   Interval History: Patient seen and examined, no acute events or new complaints overnight. Patient reports she is having some abdominal discomfort this AM; remains distended and tympanic. No fever, chills, emesis. Leukocytosis this morning to 13.9K which is stable. Hgb to 10.3; stable. Renal function normal with sCr - 0.64; UO - 1650 ccs. Mild hypokalemia to 3.2. NGT output 300 ccs; this does appear to be thinning and clearing this morning. KUB this AM still with persistent loops of small bowel dilation. Question of 1 episode of flatus this AM per family at bedside.   Review of Systems:  Constitutional: denies fever, chills  HEENT: denies cough or congestion  Respiratory: denies any shortness of breath  Cardiovascular: denies chest pain or palpitations  Gastrointestinal: + distension, denied abdominal pain, nausea, emesis Genitourinary: denies burning with urination or urinary frequency Musculoskeletal: denies pain, decreased motor or sensation  Vital signs in last 24 hours: [min-max] current  Temp:  [98.1 F (36.7 C)-99.7 F (37.6 C)] 98.1 F (36.7 C) (05/10 0526) Pulse Rate:  [92-110] 108 (05/10 0503) Resp:  [20-26] 24 (05/10 0503) BP: (143-179)/(48-81) 143/48 (05/10 0600) SpO2:  [89 %-94 %] 90 % (05/10 0503) Weight:  [74.1 kg] 74.1 kg (05/10 0503)     Height: 5\' 4"  (162.6 cm) Weight: 74.1 kg BMI (Calculated): 28.03   Intake/Output last 2 shifts:  05/09 0701 - 05/10 0700 In: 796 [I.V.:391; IV Piggyback:405] Out: 1950 [Urine:1650; Emesis/NG output:300]   Physical Exam:  Constitutional: alert, cooperative and no distress  HENT: normocephalic without obvious abnormality; NGT in place  Eyes: PERRL, EOM's grossly intact and symmetric  Respiratory: breathing non-labored at rest  Gastrointestinal: soft, no over tenderness, she is distended with tympany, no  rebound/guarding, certainly without peritonitis   Labs:     Latest Ref Rng & Units 02/06/2023    4:55 AM 02/05/2023    3:20 AM 02/04/2023    4:11 AM  CBC  WBC 4.0 - 10.5 K/uL 13.9  13.3  13.8   Hemoglobin 12.0 - 15.0 g/dL 60.4  54.0  98.1   Hematocrit 36.0 - 46.0 % 30.8  30.8  34.2   Platelets 150 - 400 K/uL 231  180  186       Latest Ref Rng & Units 02/06/2023    4:55 AM 02/05/2023    3:20 AM 02/04/2023    4:11 AM  CMP  Glucose 70 - 99 mg/dL 191  478  295   BUN 8 - 23 mg/dL 12  15  23    Creatinine 0.44 - 1.00 mg/dL 6.21  3.08  6.57   Sodium 135 - 145 mmol/L 134  134  133   Potassium 3.5 - 5.1 mmol/L 3.2  3.5  3.8   Chloride 98 - 111 mmol/L 105  104  104   CO2 22 - 32 mmol/L 21  22  22    Calcium 8.9 - 10.3 mg/dL 7.7  7.7  8.0      Imaging studies:   KUB + CXR (02/06/2023) personally reviewed which shows dilated loops of small bowel, similar in appearance, no free air, and radiologist report reviewed below:  IMPRESSION: Few dilated air-filled small bowel loops favoring postop ileus.   Increased patchy BILATERAL pulmonary infiltrates favoring multifocal pneumonia.   Assessment/Plan: (ICD-10's: K29.7) 82 y.o. female with post-operative ileus 4 Days Post-Op s/p percutaneous fixation of left femoral neck fracture.   -  Continue NPO + IVF Resuscitation  - Would continue NGT decompression given persistent distension and lack of significant flatus; LIS; monitor and record output. Once flatus returns and distension improves, I think we can proceed with 4 hour clamping trial - Monitor abdominal examination; on-going bowel function - No indication for surgical intervention - Serial KUBs as needed; will order for AM  - Pain control prn; limit narcotics as feasible - Monitor electrolytes; replete K+   - Mobilize as tolerated; okay to work with therapies as feasible    - Further management per primary service; we will follow    All of the above findings and recommendations were  discussed with the patient, patient's family at bedside, and the medical team, and all of patient's and family's questions were answered to their expressed satisfaction.  -- Lynden Oxford, PA-C [facility] 02/06/2023, 7:38 AM M-F: 7am - 4pm

## 2023-02-06 NOTE — Consult Note (Signed)
ANTICOAGULATION CONSULT NOTE - Initial Consult  Pharmacy Consult for heparin infusion Indication: chest pain/ACS  Allergies  Allergen Reactions   Augmentin [Amoxicillin-Pot Clavulanate] Swelling and Rash    Swelling of the lips   Lexapro [Escitalopram Oxalate]    Micardis [Telmisartan] Itching   Myrbetriq [Mirabegron] Itching   Niacin And Related Itching   Codeine Sulfate Other (See Comments)    "Makes her hyper"   Vesicare [Solifenacin Succinate] Nausea And Vomiting and Rash    Patient Measurements: Height: 5\' 4"  (162.6 cm) Weight: 74.1 kg (163 lb 5.8 oz) IBW/kg (Calculated) : 54.7 Heparin Dosing Weight: 64.4 kg   Vital Signs: Temp: 98.1 F (36.7 C) (05/10 0526) Temp Source: Oral (05/10 0526) BP: 179/55 (05/10 0503) Pulse Rate: 108 (05/10 0503)  Labs: Recent Labs    02/03/23 1747 02/03/23 2329 02/04/23 0411 02/04/23 1342 02/05/23 0320 02/05/23 0952 02/05/23 1816 02/06/23 0455  HGB 12.5  --  11.4*  --  10.4*  --   --  10.3*  HCT 37.5  --  34.2*  --  30.8*  --   --  30.8*  PLT 198  --  186  --  180  --   --  231  HEPARINUNFRC  --   --  <0.10*   < >  --  0.39 0.37 0.28*  CREATININE 1.11*  --  0.82  --  0.65  --   --  0.64  TROPONINIHS 15 17 18*  --   --   --   --   --    < > = values in this interval not displayed.     Estimated Creatinine Clearance: 52.6 mL/min (by C-G formula based on SCr of 0.64 mg/dL).   Medical History: Past Medical History:  Diagnosis Date   AK (actinic keratosis) 11/12/2022   left upper arm, tx'd with EDC   AK (actinic keratosis) 11/12/2022   right medial pretibia, LN2 11/25/22   Basal cell carcinoma 06/21/2020   left post shoulder sup, left mid chest, left lat thigh   Basal cell carcinoma 11/07/2021   L upper back, EDC   Basal cell carcinoma 11/07/2021   R upper back, EDC   Basal cell carcinoma 11/27/2021   right upper arm, EDC   Basal cell carcinoma 11/27/2021   right shoulder posterior, EDC   Basal cell carcinoma  11/27/2021   right anterior shoulder, EDC   Basal cell carcinoma (BCC) 06/13/2021   left dorsal foot, EDC 10/02/2021   Basal cell carcinoma (BCC) 06/13/2021   right postauricular tx'd w/ EDC   BCC (basal cell carcinoma of skin) 03/30/2007   R inf med pretibial - BCC   BCC (basal cell carcinoma of skin) 10/12/2013   L nasal ala - BCC   BCC (basal cell carcinoma of skin) 03/10/2018   L mid dorsum med forearm - superficial BCC    BCC (basal cell carcinoma) 10/02/2021   left dorsal foot proximal, EDC 10/02/2021   BCC (basal cell carcinoma) 10/02/2021   left mid back, superficial EDC 11/07/21   BCC (basal cell carcinoma) 10/02/2021   right pretibia   BCC (basal cell carcinoma) 10/02/2021   right mid back, superficial EDC 11/07/21   BCC (basal cell carcinoma) 08/14/2022   left distal calf, tx'd with EDC   BCC (basal cell carcinoma) 11/12/2022   superficial at left lateral pretibial,l tx'd with EDC   BCC (basal cell carcinoma) 11/12/2022   superficial at mid back right of midline, scheduled for [facility]  BCC (basal cell carcinoma) 11/12/2022   mid back right of midline, [facility] 11/25/22   Breast screening, unspecified 2013   Cancer (HCC) 1992   skin   Degenerative disk disease    GERD (gastroesophageal reflux disease)    History of basal cell carcinoma (BCC) 08/29/2020    left inferior knee lateral, , left inferior knee medial, right pretibia   History of SCC (squamous cell carcinoma) of skin 08/29/2020   right posterior calf, left lateral calf, and    Hypercholesterolemia 2008   Interstitial cystitis    followed by Dr Achilles Dunk   Other sign and symptom in breast 2013   Left upper outer quadrant breast "soreness" Ultrasound exam of right breast in the 2 o'clock position with the breast distracted medially showed a 0.3-0.4 with 0.5 cm simple cyst. In the 1 o'clock position where pt reported tenderness US exam was negatiive.  Because of her history of intermittent nipple drainage, ultrasound was completed  of the retroareolar area.   Personal history of tobacco use, presenting hazards to health    PONV (postoperative nausea and vomiting)    SCC (squamous cell carcinoma) 03/28/2008   R dorsum hand - SCC   SCC (squamous cell carcinoma) 05/09/2009   R lat lower leg - SCCIS   SCC (squamous cell carcinoma) 05/09/2009   L lat lower leg - SCCIS   SCC (squamous cell carcinoma) 04/07/2013   L lower leg - SCCIS   SCC (squamous cell carcinoma) 04/27/2013   R forearm - SCC   SCC (squamous cell carcinoma) 04/28/2017   L lat knee - SCCIS   SCC (squamous cell carcinoma) 05/12/2018   L prox dorsum forearm - SCC   SCC (squamous cell carcinoma) 06/13/2021   left forearm tx'd w/ EDC   SCC (squamous cell carcinoma), leg, right 03/30/2007   R sup pretibial - SCCIS   SCC (squamous cell carcinoma);BCC 10/12/2013   Mid back - superficial BCC with SCCIS    Special screening for malignant neoplasms, colon    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   left dorsal forearm, left lat calf   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 Last dose 5/7 @ 0900  Assessment: 82 y.o. female with a hx of hypertension, hyperlipidemia, aortic atherosclerosis, syncope, and recurrent falls, admitted 02/01/23 with left hip fracture 2/2 mechanical fall. Post-Op Day 1 Left femoral neck fracture fixation on 02/02/23. On 02/03/23 patient with new onset chest pain and new onset afib on EKG. Pharmacy has been consulted to initiate and manage IV heparin therapy.  Initial troponin 15.  Goal of Therapy:  Heparin level 0.3-0.7 units/ml Monitor platelets by anticoagulation protocol: Yes   5/8: HL @ 0411 = < 0.1, SUBtherapeutic 5/8: HL @ 1342 = < 0.1, SUBtherapeutic 5/9: HL @ 0050 =  0.24,  SUBtherapeutic 5/9: HL @ 0952 = 0.39, Therapeutic 5/9: HL  @ 1816 = 0.37 5/10:  HL @ 0455 = 0.28, SUBtherapeutic   Plan: 5/10:  HL @ 0455 = 0.28, SUBtherapeutic  - Heparin 950 units IV X 1 bolus and increase drip rate to 1500 units/hr. - Will recheck HL 8 hrs after rate change.  Continue to monitor H&H and platelets daily while on heparin gtt.  Scherrie Gerlach, PharmD Clinical Pharmacist   02/06/2023  6:37 AM

## 2023-02-06 NOTE — Progress Notes (Signed)
Subjective:  POD #4 s/p percutaneous fixation for left impacted femoral neck hip fracture.  Operative course has been complicated by ileus, malignant hypertension and paroxysmal atrial fibrillation.  Patient reports left hip pain as mild to moderate.  Patient is sitting at the bedside working with physical therapy.  Her sister is in the room.  Objective:   VITALS:   Vitals:   02/06/23 1025 02/06/23 1100 02/06/23 1150 02/06/23 1222  BP: (!) 166/56 (!) 163/61 (!) 131/49   Pulse:  (!) 111 87   Resp:  (!) 33    Temp:   97.9 F (36.6 C) 98.5 F (36.9 C)  TempSrc:   Oral   SpO2:  (!) 89% 91%   Weight:      Height:        PHYSICAL EXAM: Left lower extremity Neurovascular intact Sensation intact distally Intact pulses distally Dorsiflexion/Plantar flexion intact Incision: dressing C/D/I No cellulitis present Compartment soft  LABS  Results for orders placed or performed during the hospital encounter of 02/01/23 (from the past 24 hour(s))  Heparin level (unfractionated)     Status: None   Collection Time: 02/05/23  6:16 PM  Result Value Ref Range   Heparin Unfractionated 0.37 0.30 - 0.70 IU/mL  CBC with Differential/Platelet     Status: Abnormal   Collection Time: 02/06/23  4:55 AM  Result Value Ref Range   WBC 13.9 (H) 4.0 - 10.5 K/uL   RBC 3.57 (L) 3.87 - 5.11 MIL/uL   Hemoglobin 10.3 (L) 12.0 - 15.0 g/dL   HCT 40.9 (L) 81.1 - 91.4 %   MCV 86.3 80.0 - 100.0 fL   MCH 28.9 26.0 - 34.0 pg   MCHC 33.4 30.0 - 36.0 g/dL   RDW 78.2 95.6 - 21.3 %   Platelets 231 150 - 400 K/uL   nRBC 0.0 0.0 - 0.2 %   Neutrophils Relative % 76 %   Neutro Abs 10.6 (H) 1.7 - 7.7 K/uL   Lymphocytes Relative 8 %   Lymphs Abs 1.1 0.7 - 4.0 K/uL   Monocytes Relative 9 %   Monocytes Absolute 1.3 (H) 0.1 - 1.0 K/uL   Eosinophils Relative 1 %   Eosinophils Absolute 0.1 0.0 - 0.5 K/uL   Basophils Relative 1 %   Basophils Absolute 0.1 0.0 - 0.1 K/uL   Immature Granulocytes 5 %   Abs Immature  Granulocytes 0.73 (H) 0.00 - 0.07 K/uL  Basic metabolic panel     Status: Abnormal   Collection Time: 02/06/23  4:55 AM  Result Value Ref Range   Sodium 134 (L) 135 - 145 mmol/L   Potassium 3.2 (L) 3.5 - 5.1 mmol/L   Chloride 105 98 - 111 mmol/L   CO2 21 (L) 22 - 32 mmol/L   Glucose, Bld 110 (H) 70 - 99 mg/dL   BUN 12 8 - 23 mg/dL   Creatinine, Ser 0.86 0.44 - 1.00 mg/dL   Calcium 7.7 (L) 8.9 - 10.3 mg/dL   GFR, Estimated >57 >84 mL/min   Anion gap 8 5 - 15  Magnesium     Status: None   Collection Time: 02/06/23  4:55 AM  Result Value Ref Range   Magnesium 2.1 1.7 - 2.4 mg/dL  Heparin level (unfractionated)     Status: Abnormal   Collection Time: 02/06/23  4:55 AM  Result Value Ref Range   Heparin Unfractionated 0.28 (L) 0.30 - 0.70 IU/mL    DG ABD ACUTE 2+V W 1V CHEST  Result Date:  02/06/2023 CLINICAL DATA:  Ileus following hip surgery EXAM: DG ABDOMEN ACUTE WITH 1 VIEW CHEST COMPARISON:  02/05/2023 FINDINGS: Nasogastric tube extends into stomach. Enlargement of cardiac silhouette. Patchy BILATERAL pulmonary infiltrates favor multifocal pneumonia. No pleural effusion or pneumothorax. Air-filled bowel loops throughout abdomen consistent with a combination of nondistended colon a few mildly distended small bowel loops. Stool present in rectum. No bowel wall thickening or free air. Osseous demineralization with prior pinning of LEFT hip. IMPRESSION: Few dilated air-filled small bowel loops favoring postop ileus. Increased patchy BILATERAL pulmonary infiltrates favoring multifocal pneumonia. Electronically Signed   By: Ulyses Southward M.D.   On: 02/06/2023 08:50   DG ABD ACUTE 2+V W 1V CHEST  Result Date: 02/05/2023 CLINICAL DATA:  Ileus. EXAM: DG ABDOMEN ACUTE WITH 1 VIEW CHEST COMPARISON:  Chest/abdominal radiographs 02/04/2023 FINDINGS: The cardiomediastinal silhouette is unchanged. Interstitial and patchy airspace opacities bilaterally, greatest in the perihilar regions, have mildly  worsened. No sizable pleural effusion or pneumothorax is identified. An enteric tube has been placed and terminates in the expected region of the gastric body with side port also overlying the stomach. No intraperitoneal free air is identified. Mild dilatation of multiple gas-filled loops of small bowel has slightly improved. Gas and stool are present in nondilated colon. Fixation screws are noted in the proximal left femur. IMPRESSION: 1. Slightly improved small bowel dilatation which may reflect ileus or partial obstruction. 2. Mildly worsened bilateral lung opacities concerning for pneumonia. Electronically Signed   By: Sebastian Ache M.D.   On: 02/05/2023 08:50    Assessment/Plan: 4 Days Post-Op   Principal Problem:   Closed left hip fracture, initial encounter (HCC) Active Problems:   Hypercholesterolemia   Hypertensive urgency   Prolonged QT interval   Subcapital fracture of hip, left, closed, initial encounter (HCC)   PAF (paroxysmal atrial fibrillation) (HCC)   Ileus (HCC)   Ileus, postoperative (HCC)   Multifocal pneumonia   Acute respiratory failure with hypoxia (HCC)   Hypokalemia  Patient progressing from an orthopedic standpoint.  Continue with physical therapy.  Patient is 25% partial weightbearing on left lower extremity.  Patient will benefit from a skilled nursing facility upon discharge.  I will defer anticoagulation therapy to the primary team given other medical comorbidities.  Patient will follow-up in the orthopedic office in 10 to 14 days after discharge.    Rachael Austin , MD 02/06/2023, 2:59 PM

## 2023-02-07 ENCOUNTER — Inpatient Hospital Stay: Payer: Medicare PPO

## 2023-02-07 DIAGNOSIS — K9189 Other postprocedural complications and disorders of digestive system: Secondary | ICD-10-CM | POA: Diagnosis not present

## 2023-02-07 DIAGNOSIS — J9601 Acute respiratory failure with hypoxia: Secondary | ICD-10-CM | POA: Diagnosis not present

## 2023-02-07 DIAGNOSIS — J189 Pneumonia, unspecified organism: Secondary | ICD-10-CM | POA: Diagnosis not present

## 2023-02-07 DIAGNOSIS — S72002A Fracture of unspecified part of neck of left femur, initial encounter for closed fracture: Secondary | ICD-10-CM | POA: Diagnosis not present

## 2023-02-07 LAB — CBC WITH DIFFERENTIAL/PLATELET
Abs Immature Granulocytes: 1.3 10*3/uL — ABNORMAL HIGH (ref 0.00–0.07)
Basophils Absolute: 0.1 10*3/uL (ref 0.0–0.1)
Basophils Relative: 1 %
Eosinophils Absolute: 0.1 10*3/uL (ref 0.0–0.5)
Eosinophils Relative: 1 %
HCT: 30.2 % — ABNORMAL LOW (ref 36.0–46.0)
Hemoglobin: 10.1 g/dL — ABNORMAL LOW (ref 12.0–15.0)
Immature Granulocytes: 9 %
Lymphocytes Relative: 9 %
Lymphs Abs: 1.4 10*3/uL (ref 0.7–4.0)
MCH: 29 pg (ref 26.0–34.0)
MCHC: 33.4 g/dL (ref 30.0–36.0)
MCV: 86.8 fL (ref 80.0–100.0)
Monocytes Absolute: 1.7 10*3/uL — ABNORMAL HIGH (ref 0.1–1.0)
Monocytes Relative: 11 %
Neutro Abs: 10.4 10*3/uL — ABNORMAL HIGH (ref 1.7–7.7)
Neutrophils Relative %: 69 %
Platelets: 235 10*3/uL (ref 150–400)
RBC: 3.48 MIL/uL — ABNORMAL LOW (ref 3.87–5.11)
RDW: 15.1 % (ref 11.5–15.5)
Smear Review: NORMAL
WBC: 15 10*3/uL — ABNORMAL HIGH (ref 4.0–10.5)
nRBC: 0 % (ref 0.0–0.2)

## 2023-02-07 LAB — BASIC METABOLIC PANEL
Anion gap: 7 (ref 5–15)
BUN: 14 mg/dL (ref 8–23)
CO2: 20 mmol/L — ABNORMAL LOW (ref 22–32)
Calcium: 7.9 mg/dL — ABNORMAL LOW (ref 8.9–10.3)
Chloride: 111 mmol/L (ref 98–111)
Creatinine, Ser: 0.7 mg/dL (ref 0.44–1.00)
GFR, Estimated: >60 mL/min (ref >60)
Glucose, Bld: 113 mg/dL — ABNORMAL HIGH (ref 70–99)
Potassium: 3.3 mmol/L — ABNORMAL LOW (ref 3.5–5.1)
Sodium: 138 mmol/L (ref 135–145)

## 2023-02-07 LAB — MAGNESIUM: Magnesium: 2.2 mg/dL (ref 1.7–2.4)

## 2023-02-07 LAB — PHOSPHORUS: Phosphorus: 2.2 mg/dL — ABNORMAL LOW (ref 2.5–4.6)

## 2023-02-07 MED ORDER — ACETAMINOPHEN 10 MG/ML IV SOLN
1000.0000 mg | Freq: Once | INTRAVENOUS | Status: AC
  Administered 2023-02-07: 1000 mg via INTRAVENOUS
  Filled 2023-02-07 (×2): qty 100

## 2023-02-07 MED ORDER — SODIUM CHLORIDE 0.9 % IV SOLN: INTRAVENOUS | Status: DC | PRN

## 2023-02-07 NOTE — Progress Notes (Signed)
Progress Note    Rachael Austin  WUJ:[id number] DOB: August 01, 1941  DOA: 02/01/2023 PCP: Dale [place], MD      Brief Narrative:    Medical records reviewed and are as summarized below:  Rachael Austin is a 82 y.o. female with a history significant for hypertension, interstitial cystitis, aortic atherosclerosis, syncope, recurrent falls, who presented to the hospital because of mechanical fall at home.   She was found to have left hip fracture.     Assessment/Plan:   Principal Problem:   Closed left hip fracture, initial encounter (HCC) Active Problems:   Hypercholesterolemia   Hypertensive urgency   Prolonged QT interval   Subcapital fracture of hip, left, closed, initial encounter (HCC)   PAF (paroxysmal atrial fibrillation) (HCC)   Ileus (HCC)   Ileus, postoperative (HCC)   Multifocal pneumonia   Acute respiratory failure with hypoxia (HCC)   Hypokalemia   Nutrition Problem: Increased nutrient needs Etiology: post-op healing  Signs/Symptoms: estimated needs   Body mass index is 28.04 kg/m.    Left hip fracture: S/p percutaneous fixation of left femoral neck hip fracture on 02/02/2023.  Follow-up with orthopedic surgeon.   Multifocal pneumonia: Continue IV ceftriaxone and doxycycline through 02/08/2023.   Acute hypoxic respiratory failure: Continue 3 L/min oxygen via [place] and wean off as able.   Ileus: NG tube is still in place.  General surgery recommended that patient remains NPO.  Continue IV fluids for hydration.    Chest pain/epigastric pain: Her pain is mostly in the epigastric region which could be from ileus.  No acute PE on CT chest.  No plan for ischemic workup.   Paroxysmal atrial fibrillation with RVR: Telemetry was showing normal sinus rhythm.  Continue IV Cardizem drip and IV heparin drip per cardiologist. 2D echo showed EF estimated at 65 to 70%, mild LVH, grade 1 diastolic dysfunction, mild to moderate TR   Hypokalemia:  Improving.  Continue potassium repletion intravenously.  AKI: Improved   Hypertensive urgency, hypertension: Continue IV Cardizem   Prolonged QTc interval: Improved from 500-454.   Continue PT and OT   Diet Order             Diet NPO time specified Except for: Ice Chips, Sips with Meds, Other (See Comments)  Diet effective now                            Consultants: Orthopedic surgeon Cardiologist General surgeon  Procedures: Percutaneous fixation of left femoral neck fracture on 02/02/2023    Medications:    docusate sodium  100 mg Oral BID   enoxaparin (LOVENOX) injection  1 mg/kg Subcutaneous Q12H   feeding supplement  237 mL Oral BID BM   hydrALAZINE  10 mg Intravenous Q6H   labetalol  10 mg Intravenous Q6H   multivitamin with minerals  1 tablet Oral Daily   pantoprazole (PROTONIX) IV  40 mg Intravenous Q12H   pravastatin  20 mg Oral q1800   senna  1 tablet Oral BID   Continuous Infusions:  0.9 % NaCl with KCl 40 mEq / L 75 mL/hr at 02/07/23 0412   cefTRIAXone (ROCEPHIN)  IV 1 g (02/06/23 2154)   diltiazem (CARDIZEM) infusion 15 mg/hr (02/07/23 0407)   doxycycline (VIBRAMYCIN) IV 100 mg (02/06/23 2342)   methocarbamol (ROBAXIN) IV 500 mg (02/06/23 2339)     Anti-infectives (From admission, onward)    Start     Dose/Rate Route  Frequency Ordered Stop   02/04/23 1200  doxycycline (VIBRAMYCIN) 100 mg in sodium chloride 0.9 % 250 mL IVPB        100 mg 125 mL/hr over 120 Minutes Intravenous 2 times daily 02/04/23 1105 02/09/23 0959   02/03/23 2100  cefTRIAXone (ROCEPHIN) 1 g in sodium chloride 0.9 % 100 mL IVPB        1 g 200 mL/hr over 30 Minutes Intravenous Every 24 hours 02/03/23 1958 02/08/23 2159   02/02/23 2000  ceFAZolin (ANCEF) IVPB 2g/100 mL premix        2 g 200 mL/hr over 30 Minutes Intravenous Every 6 hours 02/02/23 1902 02/03/23 0851   02/02/23 0600  ceFAZolin (ANCEF) IVPB 2g/100 mL premix        2 g 200 mL/hr over 30 Minutes  Intravenous On call to O.R. 02/01/23 2236 02/02/23 1330              Family Communication/Anticipated D/C date and plan/Code Status   DVT prophylaxis: SCDs Start: 02/02/23 1902 Place TED hose Start: 02/02/23 1902 SCDs Start: 02/01/23 2015     Code Status: Full Code  Family Communication: Plan discussed with [name] (daughter) patient's sister and brother-in-law at the bedside Disposition Plan: May be ready for discharge in about 4 to 5 days   Status is: Inpatient Remains inpatient appropriate because: Ileus, paroxysmal atrial fibrillation       Subjective:   Interval events noted.  Abdominal pain is better.  She moved her bowels twice last night. [name], daughter, was at the bedside.  Patient's sister and brother-in-law were also at the bedside.  Objective:    Vitals:   02/07/23 0401 02/07/23 0407 02/07/23 0500 02/07/23 0800  BP:  (!) 152/49 (!) 148/67 (!) 150/91  Pulse:    89  Resp:    20  Temp: 98.5 F (36.9 C)   98 F (36.7 C)  TempSrc: Oral   Oral  SpO2:    93%  Weight:      Height:       No data found.   Intake/Output Summary (Last 24 hours) at 02/07/2023 1338 Last data filed at 02/07/2023 1610 Gross per 24 hour  Intake 1485.09 ml  Output 830 ml  Net 655.09 ml   Filed Weights   02/04/23 0501 02/05/23 0403 02/06/23 0503  Weight: 71.4 kg 71.1 kg 74.1 kg    Exam:  GEN: NAD SKIN: Warm and dry EYES: Anicteric ENT: MMM, +NG tube to intermittent low wall suction (black fluid in canister) CV: RRR PULM: CTA B ABD: soft, ND, NT, +BS CNS: AAO x 3, non focal EXT: Left hip surgical incisional wound is clean, dry and intact       Data Reviewed:   I have personally reviewed following labs and imaging studies:  Labs: Labs show the following:   Basic Metabolic Panel: Recent Labs  Lab 02/02/23 0425 02/03/23 0519 02/03/23 1747 02/04/23 0411 02/05/23 0320 02/06/23 0455 02/07/23 0537  NA 138   < > 131* 133* 134* 134* 138  K 3.7   < > 3.8  3.8 3.5 3.2* 3.3*  CL 106   < > 97* 104 104 105 111  CO2 22   < > 18* 22 22 21* 20*  GLUCOSE 175*   < > 150* 130* 120* 110* 113*  BUN 9   < > 25* 23 15 12 14   CREATININE 0.72   < > 1.11* 0.82 0.65 0.64 0.70  CALCIUM 8.5*   < >  8.6* 8.0* 7.7* 7.7* 7.9*  MG 2.2  --   --   --  2.0 2.1 2.2  PHOS  --   --   --   --   --   --  2.2*   < > = values in this interval not displayed.   GFR Estimated Creatinine Clearance: 52.6 mL/min (by C-G formula based on SCr of 0.7 mg/dL). Liver Function Tests: No results for input(s): "AST", "ALT", "ALKPHOS", "BILITOT", "PROT", "ALBUMIN" in the last 168 hours. No results for input(s): "LIPASE", "AMYLASE" in the last 168 hours. No results for input(s): "AMMONIA" in the last 168 hours. Coagulation profile No results for input(s): "INR", "PROTIME" in the last 168 hours.  CBC: Recent Labs  Lab 02/01/23 1813 02/02/23 0425 02/03/23 1747 02/04/23 0411 02/05/23 0320 02/06/23 0455 02/07/23 0537  WBC 7.2   < > 16.0* 13.8* 13.3* 13.9* 15.0*  NEUTROABS 4.6  --   --   --   --  10.6* 10.4*  HGB 12.5   < > 12.5 11.4* 10.4* 10.3* 10.1*  HCT 38.4   < > 37.5 34.2* 30.8* 30.8* 30.2*  MCV 88.9   < > 86.4 87.0 85.6 86.3 86.8  PLT 226   < > 198 186 180 231 235   < > = values in this interval not displayed.   Cardiac Enzymes: No results for input(s): "CKTOTAL", "CKMB", "CKMBINDEX", "TROPONINI" in the last 168 hours. BNP (last 3 results) No results for input(s): "PROBNP" in the last 8760 hours. CBG: Recent Labs  Lab 02/03/23 1712 02/03/23 1916 02/03/23 2121  GLUCAP 148* 144* 136*   D-Dimer: No results for input(s): "DDIMER" in the last 72 hours. Hgb A1c: No results for input(s): "HGBA1C" in the last 72 hours. Lipid Profile: No results for input(s): "CHOL", "HDL", "LDLCALC", "TRIG", "CHOLHDL", "LDLDIRECT" in the last 72 hours. Thyroid function studies: No results for input(s): "TSH", "T4TOTAL", "T3FREE", "THYROIDAB" in the last 72 hours.  Invalid input(s):  "FREET3" Anemia work up: No results for input(s): "VITAMINB12", "FOLATE", "FERRITIN", "TIBC", "IRON", "RETICCTPCT" in the last 72 hours. Sepsis Labs: Recent Labs  Lab 02/04/23 0411 02/05/23 0320 02/06/23 0455 02/07/23 0537  WBC 13.8* 13.3* 13.9* 15.0*    Microbiology No results found for this or any previous visit (from the past 240 hour(s)).  Procedures and diagnostic studies:  DG ABD ACUTE 2+V W 1V CHEST  Result Date: 02/07/2023 CLINICAL DATA:  Ileus EXAM: DG ABDOMEN ACUTE WITH 1 VIEW CHEST COMPARISON:  02/06/2023 FINDINGS: NG tube is in the stomach. Heart and mediastinal contours are within normal limits. Extensive bilateral airspace opacities concerning for multifocal pneumonia, stable. No visible effusions. Dilated small bowel loops and right colon again noted. Favor ileus. No real change since prior study. Prior cholecystectomy. No free air or organomegaly peer IMPRESSION: Continued dilated large and small bowel, favor ileus. No real change. Continued patchy bilateral airspace disease concerning for multifocal pneumonia. No change. Electronically Signed   By: Charlett Nose M.D.   On: 02/07/2023 07:31   DG ABD ACUTE 2+V W 1V CHEST  Result Date: 02/06/2023 CLINICAL DATA:  Ileus following hip surgery EXAM: DG ABDOMEN ACUTE WITH 1 VIEW CHEST COMPARISON:  02/05/2023 FINDINGS: Nasogastric tube extends into stomach. Enlargement of cardiac silhouette. Patchy BILATERAL pulmonary infiltrates favor multifocal pneumonia. No pleural effusion or pneumothorax. Air-filled bowel loops throughout abdomen consistent with a combination of nondistended colon a few mildly distended small bowel loops. Stool present in rectum. No bowel wall thickening or free air. Osseous  demineralization with prior pinning of LEFT hip. IMPRESSION: Few dilated air-filled small bowel loops favoring postop ileus. Increased patchy BILATERAL pulmonary infiltrates favoring multifocal pneumonia. Electronically Signed   By: Ulyses Southward M.D.   On: 02/06/2023 08:50               LOS: 6 days      [facility]   Pager on [email or web address]. If 7PM-7AM, please contact night-coverage at [email or web address]     02/07/2023, 1:38 PM

## 2023-02-07 NOTE — Progress Notes (Signed)
PT Cancellation Note  Patient Details Name: NUALA WOEHRLE MRN: [id number] DOB: Mar 05, 1941   Cancelled Treatment:    Reason Eval/Treat Not Completed: Fatigue/lethargy limiting ability to participate  Pt to fatigued this am to participate.  Will reschedule for this PM.   Danielle Dess 02/07/2023, 10:14 AM

## 2023-02-07 NOTE — Progress Notes (Signed)
Subjective:  POD #5 s/p percutaneous fixation of left impacted femoral neck hip fracture.   Patient reports left hip pain as mild.  Patient's sister is at the bedside.  Her NG tube is clamped.  He denies any nausea or shortness of breath.  She is receiving supplemental O2 with nasal cannula.  Objective:   VITALS:   Vitals:   02/07/23 0401 02/07/23 0407 02/07/23 0500 02/07/23 0800  BP:  (!) 152/49 (!) 148/67 (!) 150/91  Pulse:    89  Resp:    20  Temp: 98.5 F (36.9 C)   98 F (36.7 C)  TempSrc: Oral   Oral  SpO2:    93%  Weight:      Height:        PHYSICAL EXAM: Left lower extremity Neurovascular intact Sensation intact distally Intact pulses distally Dorsiflexion/Plantar flexion intact Incision: dressing C/D/I No cellulitis present Compartment soft  LABS  Results for orders placed or performed during the hospital encounter of 02/01/23 (from the past 24 hour(s))  CBC with Differential/Platelet     Status: Abnormal   Collection Time: 02/07/23  5:37 AM  Result Value Ref Range   WBC 15.0 (H) 4.0 - 10.5 K/uL   RBC 3.48 (L) 3.87 - 5.11 MIL/uL   Hemoglobin 10.1 (L) 12.0 - 15.0 g/dL   HCT 09.8 (L) 11.9 - 14.7 %   MCV 86.8 80.0 - 100.0 fL   MCH 29.0 26.0 - 34.0 pg   MCHC 33.4 30.0 - 36.0 g/dL   RDW 82.9 56.2 - 13.0 %   Platelets 235 150 - 400 K/uL   nRBC 0.0 0.0 - 0.2 %   Neutrophils Relative % 69 %   Neutro Abs 10.4 (H) 1.7 - 7.7 K/uL   Lymphocytes Relative 9 %   Lymphs Abs 1.4 0.7 - 4.0 K/uL   Monocytes Relative 11 %   Monocytes Absolute 1.7 (H) 0.1 - 1.0 K/uL   Eosinophils Relative 1 %   Eosinophils Absolute 0.1 0.0 - 0.5 K/uL   Basophils Relative 1 %   Basophils Absolute 0.1 0.0 - 0.1 K/uL   WBC Morphology MORPHOLOGY UNREMARKABLE    RBC Morphology MORPHOLOGY UNREMARKABLE    Smear Review Normal platelet morphology    Immature Granulocytes 9 %   Abs Immature Granulocytes 1.30 (H) 0.00 - 0.07 K/uL  Basic metabolic panel     Status: Abnormal   Collection  Time: 02/07/23  5:37 AM  Result Value Ref Range   Sodium 138 135 - 145 mmol/L   Potassium 3.3 (L) 3.5 - 5.1 mmol/L   Chloride 111 98 - 111 mmol/L   CO2 20 (L) 22 - 32 mmol/L   Glucose, Bld 113 (H) 70 - 99 mg/dL   BUN 14 8 - 23 mg/dL   Creatinine, Ser 8.65 0.44 - 1.00 mg/dL   Calcium 7.9 (L) 8.9 - 10.3 mg/dL   GFR, Estimated >78 >46 mL/min   Anion gap 7 5 - 15  Magnesium     Status: None   Collection Time: 02/07/23  5:37 AM  Result Value Ref Range   Magnesium 2.2 1.7 - 2.4 mg/dL  Phosphorus     Status: Abnormal   Collection Time: 02/07/23  5:37 AM  Result Value Ref Range   Phosphorus 2.2 (L) 2.5 - 4.6 mg/dL    DG ABD ACUTE 2+V W 1V CHEST  Result Date: 02/07/2023 CLINICAL DATA:  Ileus EXAM: DG ABDOMEN ACUTE WITH 1 VIEW CHEST COMPARISON:  02/06/2023 FINDINGS: NG tube  is in the stomach. Heart and mediastinal contours are within normal limits. Extensive bilateral airspace opacities concerning for multifocal pneumonia, stable. No visible effusions. Dilated small bowel loops and right colon again noted. Favor ileus. No real change since prior study. Prior cholecystectomy. No free air or organomegaly peer IMPRESSION: Continued dilated large and small bowel, favor ileus. No real change. Continued patchy bilateral airspace disease concerning for multifocal pneumonia. No change. Electronically Signed   By: Charlett Nose M.D.   On: 02/07/2023 07:31   DG ABD ACUTE 2+V W 1V CHEST  Result Date: 02/06/2023 CLINICAL DATA:  Ileus following hip surgery EXAM: DG ABDOMEN ACUTE WITH 1 VIEW CHEST COMPARISON:  02/05/2023 FINDINGS: Nasogastric tube extends into stomach. Enlargement of cardiac silhouette. Patchy BILATERAL pulmonary infiltrates favor multifocal pneumonia. No pleural effusion or pneumothorax. Air-filled bowel loops throughout abdomen consistent with a combination of nondistended colon a few mildly distended small bowel loops. Stool present in rectum. No bowel wall thickening or free air. Osseous  demineralization with prior pinning of LEFT hip. IMPRESSION: Few dilated air-filled small bowel loops favoring postop ileus. Increased patchy BILATERAL pulmonary infiltrates favoring multifocal pneumonia. Electronically Signed   By: Ulyses Southward M.D.   On: 02/06/2023 08:50    Assessment/Plan: 5 Days Post-Op   Principal Problem:   Closed left hip fracture, initial encounter (HCC) Active Problems:   Hypercholesterolemia   Hypertensive urgency   Prolonged QT interval   Subcapital fracture of hip, left, closed, initial encounter (HCC)   PAF (paroxysmal atrial fibrillation) (HCC)   Ileus (HCC)   Ileus, postoperative (HCC)   Multifocal pneumonia   Acute respiratory failure with hypoxia (HCC)   Hypokalemia  Patient stable from an orthopedic standpoint.  Patient's postoperative course has been complicated by ileus.  She general surgery management.  Patient may continue physical and Occupational Therapy as tolerated.  Will defer to primary team regarding anticoagulation therapy when her current medical comorbidities.    Juanell Fairly , MD 02/07/2023, 12:18 PM

## 2023-02-07 NOTE — Progress Notes (Signed)
Physical Therapy Treatment Patient Details Name: Rachael Austin MRN: [id number] DOB: 1940/12/25 Today's Date: 02/07/2023   History of Present Illness Pt is an 82 y/o F admitted on 02/01/23 after presenting with c/o a fall. Pt found to have L impacted femoral neck hip fx & underwent percutaneous fixation on 02/02/23. PMH: HTN, interstitial cystitis, DDD, GERD, hypercholesterolemia. Found to have post-operative ileus    PT Comments    Pt is making good progress towards goals, however limited by weakness/pain/endurance. Pt agreeable to session and able to tolerate extended time at EOB for mobility/ADLs. Reports she has no used BSC this date, although has no desire to urinate. Fatigues while seated at EOB, declines attempts for further mobility progression this date. Will continue to progress as able.   Recommendations for follow up therapy are one component of a multi-disciplinary discharge planning process, led by the attending physician.  Recommendations may be updated based on patient status, additional functional criteria and insurance authorization.  Follow Up Recommendations  Can patient physically be transported by private vehicle: No    Assistance Recommended at Discharge Frequent or constant Supervision/Assistance  Patient can return home with the following A lot of help with walking and/or transfers;A lot of help with bathing/dressing/bathroom;Assist for transportation;Assistance with cooking/housework;Direct supervision/assist for financial management;Help with stairs or ramp for entrance;Direct supervision/assist for medications management   Equipment Recommendations   (TBD)    Recommendations for Other Services       Precautions / Restrictions Precautions Precautions: Fall Restrictions Weight Bearing Restrictions: Yes LLE Weight Bearing: Partial weight bearing LLE Partial Weight Bearing Percentage or Pounds: 25%     Mobility  Bed Mobility Overal bed mobility: Needs  Assistance Bed Mobility: Supine to Sit, Sit to Supine Rolling: Min assist   Supine to sit: Min assist Sit to supine: Mod assist   General bed mobility comments: needs assist for B LE management (R>L). Once seated, fatigues and prefers R side lean against bed    Transfers                   General transfer comment: after sitting at EOB for period of time, felt too tired to attempt OOB mobility. Did perform lateral scooting at EOB with good hip clearance.    Ambulation/Gait               General Gait Details: not performed this date secondary to fatigue   Stairs             Wheelchair Mobility    Modified Rankin (Stroke Patients Only)       Balance Overall balance assessment: Needs assistance Sitting-balance support: Feet supported, Bilateral upper extremity supported Sitting balance-Leahy Scale: Good                                      Cognition Arousal/Alertness: Awake/alert Behavior During Therapy: WFL for tasks assessed/performed Overall Cognitive Status: Within Functional Limits for tasks assessed                                 General Comments: pleasant and hesitantly agreeable to session        Exercises Other Exercises Other Exercises: Supine/seated ther-ex performed on B LE including AP, quad sets, SAQ, LAQ, and hip add squeezes. 10 reps with therapeduic rest breaks as needed. Other Exercises: Once seated at  EOB, able to perform ADLs including teeth brushing (mod assist for set up) and brushing hair with max assist. Other Exercises: O2 sats removed for mobility with decrease to 87% on RA. 2L returned with quick recovery.    General Comments        Pertinent Vitals/Pain Pain Assessment Pain Assessment: Faces Faces Pain Scale: Hurts little more Pain Location: abdomen, no L hip pain Pain Descriptors / Indicators: Sore Pain Intervention(s): Limited activity within patient's tolerance, Repositioned     Home Living                          Prior Function            PT Goals (current goals can now be found in the care plan section) Acute Rehab PT Goals Patient Stated Goal: get moving better PT Goal Formulation: With patient Time For Goal Achievement: 02/18/23 Potential to Achieve Goals: Fair Progress towards PT goals: Progressing toward goals    Frequency    7X/week      PT Plan Current plan remains appropriate    Co-evaluation              AM-PAC PT "6 Clicks" Mobility   Outcome Measure  Help needed turning from your back to your side while in a flat bed without using bedrails?: A Little Help needed moving from lying on your back to sitting on the side of a flat bed without using bedrails?: A Lot Help needed moving to and from a bed to a chair (including a wheelchair)?: A Lot Help needed standing up from a chair using your arms (e.g., wheelchair or bedside chair)?: A Lot Help needed to walk in hospital room?: Total Help needed climbing 3-5 steps with a railing? : Total 6 Click Score: 11    End of Session Equipment Utilized During Treatment: Oxygen Activity Tolerance: Patient tolerated treatment well Patient left: in bed;with call bell/phone within reach;with bed alarm set;with SCD's reapplied;with family/visitor present Nurse Communication: Mobility status;Weight bearing status PT Visit Diagnosis: Difficulty in walking, not elsewhere classified (R26.2);Other abnormalities of gait and mobility (R26.89);Muscle weakness (generalized) (M62.81)     Time: 1610-9604 PT Time Calculation (min) (ACUTE ONLY): 24 min  Charges:  $Therapeutic Exercise: 8-22 mins $Therapeutic Activity: 8-22 mins                     Elizabeth Palau, PT, DPT, GCS [phone]    , 02/07/2023, 2:33 PM

## 2023-02-07 NOTE — Progress Notes (Signed)
Subjective:  CC: Rachael Austin is a 82 y.o. female  Hospital stay day 6, postop ileus  HPI: No acute issues overnight.  Patient's family endorses a bowel movement.  ROS:  General: Denies weight loss, weight gain, fatigue, fevers, chills, and night sweats. Heart: Denies chest pain, palpitations, racing heart, irregular heartbeat, leg pain or swelling, and decreased activity tolerance. Respiratory: Denies breathing difficulty, shortness of breath, wheezing, cough, and sputum. GI: Denies change in appetite, heartburn, nausea, vomiting, constipation, diarrhea, and blood in stool. GU: Denies difficulty urinating, pain with urinating, urgency, frequency, blood in urine.   Objective:   Temp:  [97.8 F (36.6 C)-98.7 F (37.1 C)] 98 F (36.7 C) (05/11 0800) Pulse Rate:  [78-102] 89 (05/11 0800) Resp:  [20-38] 20 (05/11 0800) BP: (130-163)/(46-91) 150/91 (05/11 0800) SpO2:  [89 %-94 %] 93 % (05/11 0800)     Height: 5\' 4"  (162.6 cm) Weight: 74.1 kg BMI (Calculated): 28.03   Intake/Output this shift:   Intake/Output Summary (Last 24 hours) at 02/07/2023 1145 Last data filed at 02/07/2023 4098 Gross per 24 hour  Intake 1485.09 ml  Output 830 ml  Net 655.09 ml    Constitutional :  alert, cooperative, appears stated age, and no distress  Respiratory:  clear to auscultation bilaterally  Cardiovascular:  regular rate and rhythm  Gastrointestinal: Soft, no guarding, but some distention noted along with hypertympany all 4 quadrants .   Skin: Cool and moist.   Psychiatric: Normal affect, non-agitated, not confused       LABS:     Latest Ref Rng & Units 02/07/2023    5:37 AM 02/06/2023    4:55 AM 02/05/2023    3:20 AM  CMP  Glucose 70 - 99 mg/dL 119  147  829   BUN 8 - 23 mg/dL 14  12  15    Creatinine 0.44 - 1.00 mg/dL 5.62  1.30  8.65   Sodium 135 - 145 mmol/L 138  134  134   Potassium 3.5 - 5.1 mmol/L 3.3  3.2  3.5   Chloride 98 - 111 mmol/L 111  105  104   CO2 22 - 32 mmol/L 20   21  22    Calcium 8.9 - 10.3 mg/dL 7.9  7.7  7.7       Latest Ref Rng & Units 02/07/2023    5:37 AM 02/06/2023    4:55 AM 02/05/2023    3:20 AM  CBC  WBC 4.0 - 10.5 K/uL 15.0  13.9  13.3   Hemoglobin 12.0 - 15.0 g/dL 78.4  69.6  29.5   Hematocrit 36.0 - 46.0 % 30.2  30.8  30.8   Platelets 150 - 400 K/uL 235  231  180     RADS: Narrative & Impression  CLINICAL DATA:  Ileus   EXAM: DG ABDOMEN ACUTE WITH 1 VIEW CHEST   COMPARISON:  02/06/2023   FINDINGS: NG tube is in the stomach. Heart and mediastinal contours are within normal limits. Extensive bilateral airspace opacities concerning for multifocal pneumonia, stable. No visible effusions.   Dilated small bowel loops and right colon again noted. Favor ileus. No real change since prior study. Prior cholecystectomy. No free air or organomegaly peer   IMPRESSION: Continued dilated large and small bowel, favor ileus. No real change.   Continued patchy bilateral airspace disease concerning for multifocal pneumonia. No change.     Electronically Signed   By: Charlett Nose M.D.   On: 02/07/2023 07:31   Assessment:  Postoperative ileus.  Endorses a bowel movement but had roughly 300 mL of dark bilious output past 24 hours and physical exam plus imaging above still concerning for persistent ileus.  Will do NG clamp trial for the next 24 hours to see if she continues to have bowel movements.  Can consider removal and starting clear liquid diet tomorrow if she remains stable.  labs/images/medications/previous chart entries reviewed personally and relevant changes/updates noted above.

## 2023-02-08 DIAGNOSIS — J189 Pneumonia, unspecified organism: Secondary | ICD-10-CM | POA: Diagnosis not present

## 2023-02-08 DIAGNOSIS — S72012A Unspecified intracapsular fracture of left femur, initial encounter for closed fracture: Secondary | ICD-10-CM | POA: Diagnosis not present

## 2023-02-08 DIAGNOSIS — S72002A Fracture of unspecified part of neck of left femur, initial encounter for closed fracture: Secondary | ICD-10-CM | POA: Diagnosis not present

## 2023-02-08 DIAGNOSIS — J9601 Acute respiratory failure with hypoxia: Secondary | ICD-10-CM | POA: Diagnosis not present

## 2023-02-08 DIAGNOSIS — K9189 Other postprocedural complications and disorders of digestive system: Secondary | ICD-10-CM | POA: Diagnosis not present

## 2023-02-08 DIAGNOSIS — I480 Paroxysmal atrial fibrillation: Secondary | ICD-10-CM | POA: Diagnosis not present

## 2023-02-08 LAB — BASIC METABOLIC PANEL
Anion gap: 7 (ref 5–15)
BUN: 11 mg/dL (ref 8–23)
CO2: 19 mmol/L — ABNORMAL LOW (ref 22–32)
Calcium: 8 mg/dL — ABNORMAL LOW (ref 8.9–10.3)
Chloride: 113 mmol/L — ABNORMAL HIGH (ref 98–111)
Creatinine, Ser: 0.6 mg/dL (ref 0.44–1.00)
GFR, Estimated: >60 mL/min (ref >60)
Glucose, Bld: 125 mg/dL — ABNORMAL HIGH (ref 70–99)
Potassium: 3.3 mmol/L — ABNORMAL LOW (ref 3.5–5.1)
Sodium: 139 mmol/L (ref 135–145)

## 2023-02-08 LAB — CBC WITH DIFFERENTIAL/PLATELET
Abs Immature Granulocytes: 1.58 10*3/uL — ABNORMAL HIGH (ref 0.00–0.07)
Basophils Absolute: 0.1 10*3/uL (ref 0.0–0.1)
Basophils Relative: 1 %
Eosinophils Absolute: 0.1 10*3/uL (ref 0.0–0.5)
Eosinophils Relative: 1 %
HCT: 30.9 % — ABNORMAL LOW (ref 36.0–46.0)
Hemoglobin: 10.2 g/dL — ABNORMAL LOW (ref 12.0–15.0)
Immature Granulocytes: 10 %
Lymphocytes Relative: 9 %
Lymphs Abs: 1.5 10*3/uL (ref 0.7–4.0)
MCH: 29 pg (ref 26.0–34.0)
MCHC: 33 g/dL (ref 30.0–36.0)
MCV: 87.8 fL (ref 80.0–100.0)
Monocytes Absolute: 1.5 10*3/uL — ABNORMAL HIGH (ref 0.1–1.0)
Monocytes Relative: 9 %
Neutro Abs: 11.6 10*3/uL — ABNORMAL HIGH (ref 1.7–7.7)
Neutrophils Relative %: 70 %
Platelets: 269 10*3/uL (ref 150–400)
RBC: 3.52 MIL/uL — ABNORMAL LOW (ref 3.87–5.11)
RDW: 15.8 % — ABNORMAL HIGH (ref 11.5–15.5)
WBC: 16.4 10*3/uL — ABNORMAL HIGH (ref 4.0–10.5)
nRBC: 0 % (ref 0.0–0.2)

## 2023-02-08 LAB — PHOSPHORUS: Phosphorus: 2.2 mg/dL — ABNORMAL LOW (ref 2.5–4.6)

## 2023-02-08 LAB — MAGNESIUM: Magnesium: 2.1 mg/dL (ref 1.7–2.4)

## 2023-02-08 MED ORDER — POTASSIUM CHLORIDE CRYS ER 20 MEQ PO TBCR: 40.0000 meq | EXTENDED_RELEASE_TABLET | Freq: Once | ORAL | Status: DC

## 2023-02-08 MED ORDER — POTASSIUM CHLORIDE 20 MEQ PO PACK
40.0000 meq | PACK | Freq: Once | ORAL | Status: AC
  Administered 2023-02-08: 40 meq via ORAL
  Filled 2023-02-08: qty 2

## 2023-02-08 NOTE — Progress Notes (Addendum)
Progress Note    [name]  ZOX:[id number] DOB: 12-19-1940  DOA: 02/01/2023 PCP: Dale [place], MD      Brief Narrative:    Medical records reviewed and are as summarized below:  Rachael Austin is a 82 y.o. female with a history significant for hypertension, interstitial cystitis, aortic atherosclerosis, syncope, recurrent falls, who presented to the hospital because of mechanical fall at home.   She was found to have left hip fracture.     Assessment/Plan:   Principal Problem:   Closed left hip fracture, initial encounter (HCC) Active Problems:   Hypercholesterolemia   Hypertensive urgency   Prolonged QT interval   Subcapital fracture of hip, left, closed, initial encounter (HCC)   PAF (paroxysmal atrial fibrillation) (HCC)   Ileus (HCC)   Ileus, postoperative (HCC)   Multifocal pneumonia   Acute respiratory failure with hypoxia (HCC)   Hypokalemia   Nutrition Problem: Increased nutrient needs Etiology: post-op healing  Signs/Symptoms: estimated needs   Body mass index is 28.04 kg/m.    Left hip fracture: S/p percutaneous fixation of left femoral neck hip fracture on 02/02/2023.  Follow-up with orthopedic surgeon.   Multifocal pneumonia: She will complete 7 days of antibiotics (ceftriaxone and doxycycline) today..   Acute hypoxic respiratory failure: She remains on 3 L/min oxygen via [place].  Taper off oxygen as able.     Ileus: NG tube has been clamped.  She is tolerating apple juice and ice chips.  Discontinue IV fluids.  Follow-up with general surgeon.   Chest pain/epigastric pain: Her pain is mostly in the epigastric region which could be from ileus.  No acute PE on CT chest.  No plan for ischemic workup.   Paroxysmal atrial fibrillation with RVR: She is in normal sinus rhythm.  She still on IV Cardizem drip.  Continue therapeutic dose of Lovenox.  2D echo showed EF estimated at 65 to 70%, mild LVH, grade 1 diastolic dysfunction,  mild to moderate TR   Hypokalemia: Replete with oral potassium chloride.   AKI: Improved   Hypertensive urgency, hypertension: Continue IV Cardizem drip and IV labetalol 10 mg every 6 hours.   Prolonged QTc interval: Improved from 500-454.   Continue PT and OT   Diet Order             Diet clear liquid Room service appropriate? Yes; Fluid consistency: Thin  Diet effective now                            Consultants: Orthopedic surgeon Cardiologist General surgeon  Procedures: Percutaneous fixation of left femoral neck fracture on 02/02/2023    Medications:    docusate sodium  100 mg Oral BID   enoxaparin (LOVENOX) injection  1 mg/kg Subcutaneous Q12H   feeding supplement  237 mL Oral BID BM   hydrALAZINE  10 mg Intravenous Q6H   labetalol  10 mg Intravenous Q6H   multivitamin with minerals  1 tablet Oral Daily   pantoprazole (PROTONIX) IV  40 mg Intravenous Q12H   potassium chloride  40 mEq Oral Once   pravastatin  20 mg Oral q1800   senna  1 tablet Oral BID   Continuous Infusions:  sodium chloride Stopped (02/08/23 0255)   diltiazem (CARDIZEM) infusion 15 mg/hr (02/08/23 0833)   doxycycline (VIBRAMYCIN) IV 100 mg (02/08/23 1125)   methocarbamol (ROBAXIN) IV Stopped (02/08/23 0030)     Anti-infectives (From admission, onward)  Start     Dose/Rate Route Frequency Ordered Stop   02/04/23 1200  doxycycline (VIBRAMYCIN) 100 mg in sodium chloride 0.9 % 250 mL IVPB        100 mg 125 mL/hr over 120 Minutes Intravenous 2 times daily 02/04/23 1105 02/09/23 0959   02/03/23 2100  cefTRIAXone (ROCEPHIN) 1 g in sodium chloride 0.9 % 100 mL IVPB        1 g 200 mL/hr over 30 Minutes Intravenous Every 24 hours 02/03/23 1958 02/07/23 2352   02/02/23 2000  ceFAZolin (ANCEF) IVPB 2g/100 mL premix        2 g 200 mL/hr over 30 Minutes Intravenous Every 6 hours 02/02/23 1902 02/03/23 0851   02/02/23 0600  ceFAZolin (ANCEF) IVPB 2g/100 mL premix        2  g 200 mL/hr over 30 Minutes Intravenous On call to O.R. 02/01/23 2236 02/02/23 1330              Family Communication/Anticipated D/C date and plan/Code Status   DVT prophylaxis: SCDs Start: 02/02/23 1902 Place TED hose Start: 02/02/23 1902 SCDs Start: 02/01/23 2015     Code Status: Full Code  Family Communication: Plan discussed with [name] (daughter) at the bedside Disposition Plan: May be ready for discharge in about 4 to 5 days   Status is: Inpatient Remains inpatient appropriate because: Ileus, paroxysmal atrial fibrillation       Subjective:   Interval events noted.  She feels better today.  No abdominal pain or vomiting.  She is tolerating ice chips and apple juice.  [name], daughter, was at the bedside.  Objective:    Vitals:   02/08/23 0500 02/08/23 0600 02/08/23 0800 02/08/23 1144  BP: (!) 124/92 (!) 171/63 (!) 176/64 131/69  Pulse: 93 95 99 73  Resp: (!) 25 (!) 25 (!) 22 20  Temp:   98 F (36.7 C) 98.1 F (36.7 C)  TempSrc:   Oral Oral  SpO2: 92% 97% 94% 95%  Weight:      Height:       No data found.   Intake/Output Summary (Last 24 hours) at 02/08/2023 1200 Last data filed at 02/08/2023 0757 Gross per 24 hour  Intake 406.32 ml  Output 900 ml  Net -493.68 ml   Filed Weights   02/04/23 0501 02/05/23 0403 02/06/23 0503  Weight: 71.4 kg 71.1 kg 74.1 kg    Exam:   GEN: NAD SKIN: Warm and dry EYES: No pallor or icterus ENT: MMM, + NG tube is clamped CV: RRR PULM: CTA B ABD: soft, ND, NT, +BS CNS: AAO x 3, non focal EXT: No edema or tenderness.  Left hip surgical wound incision is clean, dry and intact   Data Reviewed:   I have personally reviewed following labs and imaging studies:  Labs: Labs show the following:   Basic Metabolic Panel: Recent Labs  Lab 02/02/23 0425 02/03/23 0519 02/04/23 0411 02/05/23 0320 02/06/23 0455 02/07/23 0537 02/08/23 0517  NA 138   < > 133* 134* 134* 138 139  K 3.7   < > 3.8 3.5 3.2* 3.3*  3.3*  CL 106   < > 104 104 105 111 113*  CO2 22   < > 22 22 21* 20* 19*  GLUCOSE 175*   < > 130* 120* 110* 113* 125*  BUN 9   < > 23 15 12 14 11   CREATININE 0.72   < > 0.82 0.65 0.64 0.70 0.60  CALCIUM 8.5*   < >  8.0* 7.7* 7.7* 7.9* 8.0*  MG 2.2  --   --  2.0 2.1 2.2 2.1  PHOS  --   --   --   --   --  2.2* 2.2*   < > = values in this interval not displayed.   GFR Estimated Creatinine Clearance: 52.6 mL/min (by C-G formula based on SCr of 0.6 mg/dL). Liver Function Tests: No results for input(s): "AST", "ALT", "ALKPHOS", "BILITOT", "PROT", "ALBUMIN" in the last 168 hours. No results for input(s): "LIPASE", "AMYLASE" in the last 168 hours. No results for input(s): "AMMONIA" in the last 168 hours. Coagulation profile No results for input(s): "INR", "PROTIME" in the last 168 hours.  CBC: Recent Labs  Lab 02/01/23 1813 02/02/23 0425 02/04/23 0411 02/05/23 0320 02/06/23 0455 02/07/23 0537 02/08/23 0517  WBC 7.2   < > 13.8* 13.3* 13.9* 15.0* 16.4*  NEUTROABS 4.6  --   --   --  10.6* 10.4* 11.6*  HGB 12.5   < > 11.4* 10.4* 10.3* 10.1* 10.2*  HCT 38.4   < > 34.2* 30.8* 30.8* 30.2* 30.9*  MCV 88.9   < > 87.0 85.6 86.3 86.8 87.8  PLT 226   < > 186 180 231 235 269   < > = values in this interval not displayed.   Cardiac Enzymes: No results for input(s): "CKTOTAL", "CKMB", "CKMBINDEX", "TROPONINI" in the last 168 hours. BNP (last 3 results) No results for input(s): "PROBNP" in the last 8760 hours. CBG: Recent Labs  Lab 02/03/23 1712 02/03/23 1916 02/03/23 2121  GLUCAP 148* 144* 136*   D-Dimer: No results for input(s): "DDIMER" in the last 72 hours. Hgb A1c: No results for input(s): "HGBA1C" in the last 72 hours. Lipid Profile: No results for input(s): "CHOL", "HDL", "LDLCALC", "TRIG", "CHOLHDL", "LDLDIRECT" in the last 72 hours. Thyroid function studies: No results for input(s): "TSH", "T4TOTAL", "T3FREE", "THYROIDAB" in the last 72 hours.  Invalid input(s):  "FREET3" Anemia work up: No results for input(s): "VITAMINB12", "FOLATE", "FERRITIN", "TIBC", "IRON", "RETICCTPCT" in the last 72 hours. Sepsis Labs: Recent Labs  Lab 02/05/23 0320 02/06/23 0455 02/07/23 0537 02/08/23 0517  WBC 13.3* 13.9* 15.0* 16.4*    Microbiology No results found for this or any previous visit (from the past 240 hour(s)).  Procedures and diagnostic studies:  DG ABD ACUTE 2+V W 1V CHEST  Result Date: 02/07/2023 CLINICAL DATA:  Ileus EXAM: DG ABDOMEN ACUTE WITH 1 VIEW CHEST COMPARISON:  02/06/2023 FINDINGS: NG tube is in the stomach. Heart and mediastinal contours are within normal limits. Extensive bilateral airspace opacities concerning for multifocal pneumonia, stable. No visible effusions. Dilated small bowel loops and right colon again noted. Favor ileus. No real change since prior study. Prior cholecystectomy. No free air or organomegaly peer IMPRESSION: Continued dilated large and small bowel, favor ileus. No real change. Continued patchy bilateral airspace disease concerning for multifocal pneumonia. No change. Electronically Signed   By: Charlett Nose M.D.   On: 02/07/2023 07:31               LOS: 7 days      [facility]   Pager on [email or web address]. If 7PM-7AM, please contact night-coverage at [email or web address]     02/08/2023, 12:00 PM

## 2023-02-08 NOTE — Progress Notes (Signed)
Physical Therapy Treatment Patient Details Name: Rachael Austin MRN: [id number] DOB: November 15, 1940 Today's Date: 02/08/2023   History of Present Illness Pt is an 82 y/o F admitted on 02/01/23 after presenting with c/o a fall. Pt found to have L impacted femoral neck hip fx & underwent percutaneous fixation on 02/02/23. PMH: HTN, interstitial cystitis, DDD, GERD, hypercholesterolemia. Found to have post-operative ileus    PT Comments    Pt in bed, sipping on fluids.  She agrees to session and wants to try getting OOB.  While prepping chair, she does begin coughing and begins spitting up a small amount.  When questioned she stated she does not typically cough while drinking.  Stated she is waiting for NG tube to be removed and is hopeful it will happen today and is awaiting surgeon.  She is fatigued from coughing and bed is noted to be wet.  Linens changed and she is able to roll left/right with min a for care.  Pt wishes to wait until NG tube is removed prior to getting up.  Surgeon arrives at end of session.  Pt agrees to get up with nursing staff later once it is removed and she is feeling better.  RN and family aware.   Recommendations for follow up therapy are one component of a multi-disciplinary discharge planning process, led by the attending physician.  Recommendations may be updated based on patient status, additional functional criteria and insurance authorization.  Follow Up Recommendations       Assistance Recommended at Discharge Frequent or constant Supervision/Assistance  Patient can return home with the following A lot of help with walking and/or transfers;A lot of help with bathing/dressing/bathroom;Assist for transportation;Assistance with cooking/housework;Direct supervision/assist for financial management;Help with stairs or ramp for entrance;Direct supervision/assist for medications management   Equipment Recommendations       Recommendations for Other Services        Precautions / Restrictions Precautions Precautions: Fall Restrictions Weight Bearing Restrictions: Yes LLE Weight Bearing: Partial weight bearing LLE Partial Weight Bearing Percentage or Pounds: 25%     Mobility  Bed Mobility Overal bed mobility: Needs Assistance Bed Mobility: Rolling Rolling: Min assist         General bed mobility comments: min assist rolling left/right for linen change Patient Response: Cooperative  Transfers                        Ambulation/Gait                   Stairs             Wheelchair Mobility    Modified Rankin (Stroke Patients Only)       Balance                                            Cognition Arousal/Alertness: Awake/alert Behavior During Therapy: WFL for tasks assessed/performed Overall Cognitive Status: Within Functional Limits for tasks assessed                                          Exercises      General Comments        Pertinent Vitals/Pain Pain Assessment Pain Assessment: Faces Faces Pain Scale: Hurts a little bit Pain Location:  abdomen, no L hip pain Pain Descriptors / Indicators: Sore Pain Intervention(s): Limited activity within patient's tolerance, Monitored during session, Repositioned    Home Living                          Prior Function            PT Goals (current goals can now be found in the care plan section) Progress towards PT goals: Progressing toward goals    Frequency    7X/week      PT Plan Current plan remains appropriate    Co-evaluation              AM-PAC PT "6 Clicks" Mobility   Outcome Measure  Help needed turning from your back to your side while in a flat bed without using bedrails?: A Little Help needed moving from lying on your back to sitting on the side of a flat bed without using bedrails?: A Lot Help needed moving to and from a bed to a chair (including a wheelchair)?: A  Lot Help needed standing up from a chair using your arms (e.g., wheelchair or bedside chair)?: A Lot Help needed to walk in hospital room?: A Lot Help needed climbing 3-5 steps with a railing? : Total 6 Click Score: 12    End of Session Equipment Utilized During Treatment: Oxygen Activity Tolerance: Patient tolerated treatment well Patient left: in bed;with call bell/phone within reach;with bed alarm set;with SCD's reapplied;with family/visitor present Nurse Communication: Mobility status;Weight bearing status PT Visit Diagnosis: Difficulty in walking, not elsewhere classified (R26.2);Other abnormalities of gait and mobility (R26.89);Muscle weakness (generalized) (M62.81)     Time: 1610-9604 PT Time Calculation (min) (ACUTE ONLY): 21 min  Charges:  $Therapeutic Activity: 8-22 mins                   Danielle Dess, PTA 02/08/23, 11:33 AM

## 2023-02-08 NOTE — Progress Notes (Signed)
Subjective: 6 Days Post-Op Procedure(s) (LRB): PERCUTANEOUS FIXATION OF FEMORAL NECK (Left) Patient is alert and awake lying quietly in her hospital bed.  Her daughter is at the bedside.  Patient states she is not having any significant pain.  Hemoglobin is stable at 10.2.  NG tube is out.  Still being treated for atrial fibrillation and hypertension.  Patient reports pain as mild.  Objective:   VITALS:   Vitals:   02/08/23 0800 02/08/23 1144  BP: (!) 176/64 131/69  Pulse: 99 73  Resp: (!) 22 20  Temp: 98 F (36.7 C) 98.1 F (36.7 C)  SpO2: 94% 95%    Neurologically intact Incision: dressing C/D/I  LABS Recent Labs    02/06/23 0455 02/07/23 0537 02/08/23 0517  HGB 10.3* 10.1* 10.2*  HCT 30.8* 30.2* 30.9*  WBC 13.9* 15.0* 16.4*  PLT 231 235 269    Recent Labs    02/06/23 0455 02/07/23 0537 02/08/23 0517  NA 134* 138 139  K 3.2* 3.3* 3.3*  BUN 12 14 11   CREATININE 0.64 0.70 0.60  GLUCOSE 110* 113* 125*    No results for input(s): "LABPT", "INR" in the last 72 hours.   Assessment/Plan: 6 Days Post-Op Procedure(s) (LRB): PERCUTANEOUS FIXATION OF FEMORAL NECK (Left)   Up with therapy Discharge to SNF when medically stable.

## 2023-02-08 NOTE — Progress Notes (Signed)
Rounding Note    Patient Name: Rachael Austin Date of Encounter: 02/08/2023  [facility] Cardiologist: [facility]-  Subjective   On rounds, NG tube removed, reports having flatus, 2 bowel movements yesterday Abdominal x-ray yesterday still with dilated bowel loops consistent with ileus, no significant change Continued patchy bilateral airspace disease concerning for multifocal pneumonia On broad-spectrum antibiotics Remains on diltiazem infusion 15 mg/h, also on labetalol and hydralazine injections every 6 hours Blood pressure running 150-1 60s  Inpatient Medications    Scheduled Meds:  docusate sodium  100 mg Oral BID   enoxaparin (LOVENOX) injection  1 mg/kg Subcutaneous Q12H   feeding supplement  237 mL Oral BID BM   hydrALAZINE  10 mg Intravenous Q6H   labetalol  10 mg Intravenous Q6H   multivitamin with minerals  1 tablet Oral Daily   pantoprazole (PROTONIX) IV  40 mg Intravenous Q12H   potassium chloride  40 mEq Oral Once   pravastatin  20 mg Oral q1800   senna  1 tablet Oral BID   Continuous Infusions:  sodium chloride Stopped (02/08/23 0255)   diltiazem (CARDIZEM) infusion 15 mg/hr (02/08/23 0833)   doxycycline (VIBRAMYCIN) IV 100 mg (02/08/23 1125)   methocarbamol (ROBAXIN) IV Stopped (02/08/23 0030)   PRN Meds: sodium chloride, acetaminophen, alum & mag hydroxide-simeth, bisacodyl, HYDROcodone-acetaminophen, labetalol, menthol-cetylpyridinium **OR** phenol, methocarbamol **OR** methocarbamol (ROBAXIN) IV, morphine injection, mouth rinse, polyethylene glycol, prochlorperazine   Vital Signs    Vitals:   02/08/23 0500 02/08/23 0600 02/08/23 0800 02/08/23 1144  BP: (!) 124/92 (!) 171/63 (!) 176/64 131/69  Pulse: 93 95 99 73  Resp: (!) 25 (!) 25 (!) 22 20  Temp:   98 F (36.7 C) 98.1 F (36.7 C)  TempSrc:   Oral Oral  SpO2: 92% 97% 94% 95%  Weight:      Height:        Intake/Output Summary (Last 24 hours) at 02/08/2023 1433 Last data  filed at 02/08/2023 0757 Gross per 24 hour  Intake 289.9 ml  Output 900 ml  Net -610.1 ml      02/06/2023    5:03 AM 02/05/2023    4:03 AM 02/04/2023    5:01 AM  Last 3 Weights  Weight (lbs) 163 lb 5.8 oz 156 lb 12 oz 157 lb 6.5 oz  Weight (kg) 74.1 kg 71.1 kg 71.4 kg      Telemetry    Sinus tachycardia- Personally Reviewed  ECG     - Personally Reviewed  Physical Exam   Constitutional:  oriented to person, place, and time. No distress.  HENT:  Head: Grossly normal Eyes:  no discharge. No scleral icterus.  Neck: No JVD, no carotid bruits  Cardiovascular: Regular rate and rhythm, no murmurs appreciated Pulmonary/Chest: Clear to auscultation bilaterally, no wheezes or rails Abdominal: Soft.  no distension.  no tenderness.  Musculoskeletal: Normal range of motion Neurological:  normal muscle tone. Coordination normal. No atrophy Skin: Skin warm and dry Psychiatric: normal affect, pleasant  Labs    High Sensitivity Troponin:   Recent Labs  Lab 02/03/23 1747 02/03/23 2329 02/04/23 0411  TROPONINIHS 15 17 18*     Chemistry Recent Labs  Lab 02/06/23 0455 02/07/23 0537 02/08/23 0517  NA 134* 138 139  K 3.2* 3.3* 3.3*  CL 105 111 113*  CO2 21* 20* 19*  GLUCOSE 110* 113* 125*  BUN 12 14 11   CREATININE 0.64 0.70 0.60  CALCIUM 7.7* 7.9* 8.0*  MG 2.1 2.2 2.1  GFRNONAA >60 >60 >60  ANIONGAP 8 7 7     Lipids No results for input(s): "CHOL", "TRIG", "HDL", "LABVLDL", "LDLCALC", "CHOLHDL" in the last 168 hours.  Hematology Recent Labs  Lab 02/06/23 0455 02/07/23 0537 02/08/23 0517  WBC 13.9* 15.0* 16.4*  RBC 3.57* 3.48* 3.52*  HGB 10.3* 10.1* 10.2*  HCT 30.8* 30.2* 30.9*  MCV 86.3 86.8 87.8  MCH 28.9 29.0 29.0  MCHC 33.4 33.4 33.0  RDW 14.8 15.1 15.8*  PLT 231 235 269   Thyroid No results for input(s): "TSH", "FREET4" in the last 168 hours.  BNPNo results for input(s): "BNP", "PROBNP" in the last 168 hours.  DDimer No results for input(s): "DDIMER" in the  last 168 hours.   Radiology    DG ABD ACUTE 2+V W 1V CHEST  Result Date: 02/07/2023 CLINICAL DATA:  Ileus EXAM: DG ABDOMEN ACUTE WITH 1 VIEW CHEST COMPARISON:  02/06/2023 FINDINGS: NG tube is in the stomach. Heart and mediastinal contours are within normal limits. Extensive bilateral airspace opacities concerning for multifocal pneumonia, stable. No visible effusions. Dilated small bowel loops and right colon again noted. Favor ileus. No real change since prior study. Prior cholecystectomy. No free air or organomegaly peer IMPRESSION: Continued dilated large and small bowel, favor ileus. No real change. Continued patchy bilateral airspace disease concerning for multifocal pneumonia. No change. Electronically Signed   By: Charlett Nose M.D.   On: 02/07/2023 07:31    Cardiac Studies   Echocardiogram EF 65 to 70%, mildly reduced RV function, TR mild to moderate  Patient Profile     82 y.o. female with h/o HTN, HLD, aortic atherosclerosis, syncope, recurrent falls who is being seen for new afib and chest pain.   Assessment & Plan    A/P: Paroxysmal atrial fibrillation Noted this admission In the setting of a fall presenting with hypertensive urgency systolic pressure 200, chest pain, aspiration pneumonia, ileus on IV fluids, status post left hip fracture surgery Maintaining normal sinus rhythm past several days on diltiazem infusion 15 mg/h for blood pressure and rhythm control also on labetalol every 6 hours  Chest pain CT scan negative for PE Atypical presentation likely secondary to ileus with distended stomach, symptoms improved after NG tube to suction   Left hip fracture, After mechanical fall Percutaneous fixation of left femoral neck this admission  Malignant hypertension Tolerating diltiazem 15 mg/h, blood pressure remains high Also on hydralazine 10 mg IV every 6 hours labetalol 10 mg IV every 6 hours,  As her GI pathology improves and tolerating p.o. intake, will look to  transition to oral medication   Long discussion with family at the bedside  Total encounter time more than 35 minutes  Greater than 50% was spent in counseling and coordination of care with the patient   For questions or updates, please contact [facility] Please consult [email or web address] for contact info under        Signed, Julien Nordmann, MD  02/08/2023, 2:33 PM

## 2023-02-08 NOTE — TOC Progression Note (Signed)
Transition of Care [facility]) - Progression Note    Patient Details  Name: Rachael Austin MRN: [id number] Date of Birth: Jul 03, 1941  Transition of Care [facility] CM/SW Contact  Kemper Durie, RN Phone Number: 02/08/2023, 10:59 AM  Clinical Narrative:     Spoke with daughter [name] regarding bed offers ([place], [place], [place], and [place]).  She inquired about [facility], advised they are still considering.  She would like to discuss with sister and will notify [facility] team of choice tomorrow.   Expected Discharge Plan: Skilled Nursing Facility Barriers to Discharge: No Barriers Identified  Expected Discharge Plan and Services   Discharge Planning Services: CM Consult   Living arrangements for the past 2 months: Single Family Home                                       Social Determinants of Health (SDOH) Interventions SDOH Screenings   Depression (PHQ2-9): Low Risk  (06/26/2022)  Tobacco Use: Medium Risk (02/04/2023)    Readmission Risk Interventions     No data to display

## 2023-02-08 NOTE — Progress Notes (Signed)
Subjective:  CC: Rachael Austin is a 82 y.o. female  Hospital stay day 7, postop ileus  HPI: No acute issues overnight.  Continues to pass flatus but no additional bowel movement recorded. ROS:  General: Denies weight loss, weight gain, fatigue, fevers, chills, and night sweats. Heart: Denies chest pain, palpitations, racing heart, irregular heartbeat, leg pain or swelling, and decreased activity tolerance. Respiratory: Denies breathing difficulty, shortness of breath, wheezing, cough, and sputum. GI: Denies change in appetite, heartburn, nausea, vomiting, constipation, diarrhea, and blood in stool. GU: Denies difficulty urinating, pain with urinating, urgency, frequency, blood in urine.   Objective:   Temp:  [97.9 F (36.6 C)-98.1 F (36.7 C)] 98.1 F (36.7 C) (05/12 1144) Pulse Rate:  [54-99] 73 (05/12 1144) Resp:  [19-35] 20 (05/12 1144) BP: (124-185)/(51-92) 131/69 (05/12 1144) SpO2:  [91 %-97 %] 95 % (05/12 1144)     Height: 5\' 4"  (162.6 cm) Weight: 74.1 kg BMI (Calculated): 28.03   Intake/Output this shift:   Intake/Output Summary (Last 24 hours) at 02/08/2023 1226 Last data filed at 02/08/2023 0757 Gross per 24 hour  Intake 406.32 ml  Output 900 ml  Net -493.68 ml    Constitutional :  alert, cooperative, appears stated age, and no distress  Respiratory:  clear to auscultation bilaterally  Cardiovascular:  regular rate and rhythm  Gastrointestinal: Soft, no guarding, improved distention noted  .   Skin: Cool and moist.   Psychiatric: Normal affect, non-agitated, not confused       LABS:     Latest Ref Rng & Units 02/08/2023    5:17 AM 02/07/2023    5:37 AM 02/06/2023    4:55 AM  CMP  Glucose 70 - 99 mg/dL 829  562  130   BUN 8 - 23 mg/dL 11  14  12    Creatinine 0.44 - 1.00 mg/dL 8.65  7.84  6.96   Sodium 135 - 145 mmol/L 139  138  134   Potassium 3.5 - 5.1 mmol/L 3.3  3.3  3.2   Chloride 98 - 111 mmol/L 113  111  105   CO2 22 - 32 mmol/L 19  20  21     Calcium 8.9 - 10.3 mg/dL 8.0  7.9  7.7       Latest Ref Rng & Units 02/08/2023    5:17 AM 02/07/2023    5:37 AM 02/06/2023    4:55 AM  CBC  WBC 4.0 - 10.5 K/uL 16.4  15.0  13.9   Hemoglobin 12.0 - 15.0 g/dL 29.5  28.4  13.2   Hematocrit 36.0 - 46.0 % 30.9  30.2  30.8   Platelets 150 - 400 K/uL 269  235  231     RADS: N/a  Assessment:   Postoperative ileus.  Continues to pass flatus and residual check after 24 hours of NG clamp noted no output.  NG tube removed and will resume clear liquid diet.  labs/images/medications/previous chart entries reviewed personally and relevant changes/updates noted above.

## 2023-02-09 DIAGNOSIS — J9601 Acute respiratory failure with hypoxia: Secondary | ICD-10-CM | POA: Diagnosis not present

## 2023-02-09 DIAGNOSIS — K567 Ileus, unspecified: Secondary | ICD-10-CM | POA: Diagnosis not present

## 2023-02-09 DIAGNOSIS — J189 Pneumonia, unspecified organism: Secondary | ICD-10-CM | POA: Diagnosis not present

## 2023-02-09 DIAGNOSIS — S72002A Fracture of unspecified part of neck of left femur, initial encounter for closed fracture: Secondary | ICD-10-CM | POA: Diagnosis not present

## 2023-02-09 DIAGNOSIS — I480 Paroxysmal atrial fibrillation: Secondary | ICD-10-CM | POA: Diagnosis not present

## 2023-02-09 LAB — BASIC METABOLIC PANEL
Anion gap: 9 (ref 5–15)
BUN: 12 mg/dL (ref 8–23)
CO2: 22 mmol/L (ref 22–32)
Calcium: 8.1 mg/dL — ABNORMAL LOW (ref 8.9–10.3)
Chloride: 110 mmol/L (ref 98–111)
Creatinine, Ser: 0.66 mg/dL (ref 0.44–1.00)
GFR, Estimated: >60 mL/min (ref >60)
Glucose, Bld: 116 mg/dL — ABNORMAL HIGH (ref 70–99)
Potassium: 3.5 mmol/L (ref 3.5–5.1)
Sodium: 141 mmol/L (ref 135–145)

## 2023-02-09 LAB — MAGNESIUM: Magnesium: 2 mg/dL (ref 1.7–2.4)

## 2023-02-09 LAB — CBC
HCT: 30.3 % — ABNORMAL LOW (ref 36.0–46.0)
Hemoglobin: 10 g/dL — ABNORMAL LOW (ref 12.0–15.0)
MCH: 29.1 pg (ref 26.0–34.0)
MCHC: 33 g/dL (ref 30.0–36.0)
MCV: 88.1 fL (ref 80.0–100.0)
Platelets: 288 10*3/uL (ref 150–400)
RBC: 3.44 MIL/uL — ABNORMAL LOW (ref 3.87–5.11)
RDW: 16 % — ABNORMAL HIGH (ref 11.5–15.5)
WBC: 12.5 10*3/uL — ABNORMAL HIGH (ref 4.0–10.5)
nRBC: 0 % (ref 0.0–0.2)

## 2023-02-09 LAB — PHOSPHORUS: Phosphorus: 2.7 mg/dL (ref 2.5–4.6)

## 2023-02-09 MED ORDER — POTASSIUM CHLORIDE 20 MEQ PO PACK
40.0000 meq | PACK | Freq: Once | ORAL | Status: AC
  Administered 2023-02-09: 40 meq via ORAL
  Filled 2023-02-09: qty 2

## 2023-02-09 MED ORDER — BOOST / RESOURCE BREEZE PO LIQD CUSTOM
1.0000 | Freq: Three times a day (TID) | ORAL | Status: DC
  Administered 2023-02-09 – 2023-02-12 (×10): 1 via ORAL

## 2023-02-09 NOTE — Progress Notes (Signed)
Physical Therapy Treatment Patient Details Name: Rachael Austin MRN: [id number] DOB: November 29, 1940 Today's Date: 02/09/2023   History of Present Illness Pt is an 82 y/o F admitted on 02/01/23 after presenting with c/o a fall. Pt found to have L impacted femoral neck hip fx & underwent percutaneous fixation on 02/02/23. PMH: HTN, interstitial cystitis, DDD, GERD, hypercholesterolemia. Found to have post-operative ileus    PT Comments    Pt hesitant for session but does agree with encouragement.  Transitions to EOB with mod a x 1 to get fully upright. Steady in sitting with some minor dizziness that clears with time.  Stands with min a x 1 for standing AROM LLE then she progresses to steps to chair at bedside.  She is quite fatigued with activity.  Remains up in chair but denies further mobility or ex at this time.   Recommendations for follow up therapy are one component of a multi-disciplinary discharge planning process, led by the attending physician.  Recommendations may be updated based on patient status, additional functional criteria and insurance authorization.  Follow Up Recommendations       Assistance Recommended at Discharge Frequent or constant Supervision/Assistance  Patient can return home with the following A lot of help with walking and/or transfers;A lot of help with bathing/dressing/bathroom;Assist for transportation;Assistance with cooking/housework;Direct supervision/assist for financial management;Help with stairs or ramp for entrance;Direct supervision/assist for medications management   Equipment Recommendations       Recommendations for Other Services       Precautions / Restrictions Precautions Precautions: Fall Restrictions Weight Bearing Restrictions: Yes LLE Weight Bearing: Partial weight bearing LLE Partial Weight Bearing Percentage or Pounds: 25%     Mobility  Bed Mobility Overal bed mobility: Needs Assistance Bed Mobility: Supine to Sit     Supine  to sit: Min assist, Mod assist       Patient Response: Cooperative  Transfers Overall transfer level: Needs assistance Equipment used: Rolling walker (2 wheels) Transfers: Sit to/from Stand Sit to Stand: Min assist, Mod assist                Ambulation/Gait Ambulation/Gait assistance: Min assist, Min guard Gait Distance (Feet): 3 Feet Assistive device: Rolling walker (2 wheels) Gait Pattern/deviations: Step-to pattern, Decreased step length - right, Decreased step length - left Gait velocity: decreased     General Gait Details: small shuffling steps to chair at bedside.   Stairs             Wheelchair Mobility    Modified Rankin (Stroke Patients Only)       Balance Overall balance assessment: Needs assistance Sitting-balance support: Feet supported, Bilateral upper extremity supported Sitting balance-Leahy Scale: Good     Standing balance support: Bilateral upper extremity supported, During functional activity Standing balance-Leahy Scale: Fair                              Cognition Arousal/Alertness: Awake/alert Behavior During Therapy: WFL for tasks assessed/performed Overall Cognitive Status: Within Functional Limits for tasks assessed                                 General Comments: does not commit initially to participating but is persuaded with time        Exercises      General Comments        Pertinent Vitals/Pain Pain Assessment Pain Assessment:  Faces Faces Pain Scale: Hurts little more Pain Location: abdomen, no L hip pain Pain Descriptors / Indicators: Sore Pain Intervention(s): Monitored during session, Limited activity within patient's tolerance, Repositioned    Home Living                          Prior Function            PT Goals (current goals can now be found in the care plan section) Progress towards PT goals: Progressing toward goals    Frequency    7X/week       PT Plan Current plan remains appropriate    Co-evaluation              AM-PAC PT "6 Clicks" Mobility   Outcome Measure  Help needed turning from your back to your side while in a flat bed without using bedrails?: A Little Help needed moving from lying on your back to sitting on the side of a flat bed without using bedrails?: A Lot Help needed moving to and from a bed to a chair (including a wheelchair)?: A Little Help needed standing up from a chair using your arms (e.g., wheelchair or bedside chair)?: A Little Help needed to walk in hospital room?: A Lot Help needed climbing 3-5 steps with a railing? : Total 6 Click Score: 14    End of Session Equipment Utilized During Treatment: Oxygen;Gait belt Activity Tolerance: Patient limited by fatigue Patient left: in chair;with call bell/phone within reach;with chair alarm set;with family/visitor present Nurse Communication: Mobility status;Weight bearing status PT Visit Diagnosis: Difficulty in walking, not elsewhere classified (R26.2);Other abnormalities of gait and mobility (R26.89);Muscle weakness (generalized) (M62.81)     Time: 1610-9604 PT Time Calculation (min) (ACUTE ONLY): 17 min  Charges:  $Therapeutic Activity: 8-22 mins                   Danielle Dess, PTA 02/09/23, 11:14 AM

## 2023-02-09 NOTE — Progress Notes (Signed)
Nutrition Follow-up  DOCUMENTATION CODES:   Not applicable  INTERVENTION:   -Boost Breeze po TID, each supplement provides 250 kcal and 9 grams of protein  -MVI with minerals daily -RD will follow for diet advancement and adjust supplement regimen as appropriate  NUTRITION DIAGNOSIS:   Increased nutrient needs related to post-op healing as evidenced by estimated needs.  Ongoing  GOAL:   Patient will meet greater than or equal to 90% of their needs  Progressing   MONITOR:   PO intake, Supplement acceptance, Diet advancement  REASON FOR ASSESSMENT:   Consult Assessment of nutrition requirement/status, Hip fracture protocol  ASSESSMENT:   Pt with medical history significant for hypertension, hyperlipidemia, and interstitial cystitis patient presents with severe left groin pain and headache after mechanical fall at home.  5/6- s/p PERCUTANEOUS FIXATION OF LEFT FEMORAL NECK HIP FRACTURE  5/7-- rapid response called 5/8- NGT placed for decompression, CT of chest showed incidental ileus 5/11- NGT clamping trial 5/12- NGT removed, advanced to clear liquid diet  Reviewed I/O's: +716 ml x 24 hours and -2.4 L since admission  UOP: 250 ml x 24 hours  Per general surgery notes, no plan to replace NGT and continue clear liquid diet. No plans for surgical intervention.    Medications reviewed and include colace, cardizem, and senokot.   Labs reviewed: CBGS: 136.    Diet Order:   Diet Order             Diet clear liquid Room service appropriate? Yes; Fluid consistency: Thin  Diet effective now                   EDUCATION NEEDS:   Education needs have been addressed  Skin:  Skin Assessment: Skin Integrity Issues: Skin Integrity Issues:: Incisions Incisions: closed lt hip  Last BM:  02/06/23  Height:   Ht Readings from Last 1 Encounters:  02/01/23 5\' 4"  (1.626 m)    Weight:   Wt Readings from Last 1 Encounters:  02/06/23 74.1 kg    Ideal Body  Weight:  54.5 kg  BMI:  Body mass index is 28.04 kg/m.  Estimated Nutritional Needs:   Kcal:  1600-1800  Protein:  80-95 grams  Fluid:  > 1.6 L    Levada Schilling, RD, LDN, CDCES Registered Dietitian II Certified Diabetes Care and Education Specialist Please refer to [facility] for RD and/or RD on-call/weekend/after hours pager

## 2023-02-09 NOTE — Evaluation (Signed)
Clinical/Bedside Swallow Evaluation Patient Details  Name: Rachael Austin MRN: [id number] Date of Birth: [birth year]  Today's Date: 02/09/2023 Time: SLP Start Time (ACUTE ONLY): 1410 SLP Stop Time (ACUTE ONLY): 1500 SLP Time Calculation (min) (ACUTE ONLY): 50 min  Past Medical History:  Past Medical History:  Diagnosis Date   AK (actinic keratosis) 11/12/2022   left upper arm, tx'd with EDC   AK (actinic keratosis) 11/12/2022   right medial pretibia, LN2 11/25/22   Basal cell carcinoma 06/21/2020   left post shoulder sup, left mid chest, left lat thigh   Basal cell carcinoma 11/07/2021   L upper back, EDC   Basal cell carcinoma 11/07/2021   R upper back, EDC   Basal cell carcinoma 11/27/2021   right upper arm, EDC   Basal cell carcinoma 11/27/2021   right shoulder posterior, EDC   Basal cell carcinoma 11/27/2021   right anterior shoulder, EDC   Basal cell carcinoma (BCC) 06/13/2021   left dorsal foot, EDC 10/02/2021   Basal cell carcinoma (BCC) 06/13/2021   right postauricular tx'd w/ EDC   BCC (basal cell carcinoma of skin) 03/30/2007   R inf med pretibial - BCC   BCC (basal cell carcinoma of skin) 10/12/2013   L nasal ala - BCC   BCC (basal cell carcinoma of skin) 03/10/2018   L mid dorsum med forearm - superficial BCC    BCC (basal cell carcinoma) 10/02/2021   left dorsal foot proximal, EDC 10/02/2021   BCC (basal cell carcinoma) 10/02/2021   left mid back, superficial EDC 11/07/21   BCC (basal cell carcinoma) 10/02/2021   right pretibia   BCC (basal cell carcinoma) 10/02/2021   right mid back, superficial EDC 11/07/21   BCC (basal cell carcinoma) 08/14/2022   left distal calf, tx'd with EDC   BCC (basal cell carcinoma) 11/12/2022   superficial at left lateral pretibial,l tx'd with EDC   BCC (basal cell carcinoma) 11/12/2022   superficial at mid back right of midline, scheduled for EDC   BCC (basal cell carcinoma) 11/12/2022   mid back right of midline, [facility] 11/25/22    Breast screening, unspecified 2013   Cancer (HCC) 1992   skin   Degenerative disk disease    GERD (gastroesophageal reflux disease)    History of basal cell carcinoma (BCC) 08/29/2020    left inferior knee lateral, , left inferior knee medial, right pretibia   History of SCC (squamous cell carcinoma) of skin 08/29/2020   right posterior calf, left lateral calf, and    Hypercholesterolemia 2008   Interstitial cystitis    followed by Dr Achilles Dunk   Other sign and symptom in breast 2013   Left upper outer quadrant breast "soreness" Ultrasound exam of right breast in the 2 o'clock position with the breast distracted medially showed a 0.3-0.4 with 0.5 cm simple cyst. In the 1 o'clock position where pt reported tenderness US exam was negatiive.  Because of her history of intermittent nipple drainage, ultrasound was completed of the retroareolar area.   Personal history of tobacco use, presenting hazards to health    PONV (postoperative nausea and vomiting)    SCC (squamous cell carcinoma) 03/28/2008   R dorsum hand - SCC   SCC (squamous cell carcinoma) 05/09/2009   R lat lower leg - SCCIS   SCC (squamous cell carcinoma) 05/09/2009   L lat lower leg - SCCIS   SCC (squamous cell carcinoma) 04/07/2013   L lower leg - SCCIS   SCC (squamous cell  carcinoma) 04/27/2013   R forearm - SCC   SCC (squamous cell carcinoma) 04/28/2017   L lat knee - SCCIS   SCC (squamous cell carcinoma) 05/12/2018   L prox dorsum forearm - SCC   SCC (squamous cell carcinoma) 06/13/2021   left forearm tx'd w/ EDC   SCC (squamous cell carcinoma), leg, right 03/30/2007   R sup pretibial - SCCIS   SCC (squamous cell carcinoma);BCC 10/12/2013   Mid back - superficial BCC with SCCIS    Special screening for malignant neoplasms, colon    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   left dorsal forearm, left lat calf   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   left lower leg superior, EDC at follow up   Squamous  cell carcinoma in situ (SCCIS) 11/12/2022   chest right of midline, tx'd with [facility]   Past Surgical History:  Past Surgical History:  Procedure Laterality Date   ABDOMINAL HYSTERECTOMY  1992   APPENDECTOMY     CERVICAL DISCECTOMY     S/P C7-T1 discectomy with fusion   CHOLECYSTECTOMY  1990   COLONOSCOPY WITH PROPOFOL N/A 02/14/2016   Procedure: COLONOSCOPY WITH PROPOFOL;  Surgeon: Midge Minium, MD;  Location: [facility];  Service: Endoscopy;  Laterality: N/A;  PT WOULD LIKE 10 ARRIVAL TIME OR LATER   DILATION AND CURETTAGE OF UTERUS     ESOPHAGOGASTRODUODENOSCOPY (EGD) WITH PROPOFOL N/A 02/14/2016   Procedure: ESOPHAGOGASTRODUODENOSCOPY (EGD) WITH PROPOFOL with dialtion;  Surgeon: Midge Minium, MD;  Location: [facility];  Service: Endoscopy;  Laterality: N/A;   ESOPHAGOGASTRODUODENOSCOPY (EGD) WITH PROPOFOL N/A 04/13/2019   Procedure: ESOPHAGOGASTRODUODENOSCOPY (EGD) WITH PROPOFOL;  Surgeon: Toledo, Boykin Nearing, MD;  Location: [facility];  Service: Gastroenterology;  Laterality: N/A;   HIP PINNING,CANNULATED Left 02/02/2023   Procedure: PERCUTANEOUS FIXATION OF FEMORAL NECK;  Surgeon: Juanell Fairly, MD;  Location: [facility];  Service: Orthopedics;  Laterality: Left;   MELANOMA EXCISION     removed from Left calf 1994   HPI:  Pt is an 82 y/o F admitted on 02/01/23 after presenting with c/o a fall. Pt found to have L impacted femoral neck hip fx & underwent percutaneous fixation on 02/02/23. PMH: HIatal Hernia, HTN, interstitial cystitis, DDD, GERD, hypercholesterolemia. Found to have post-operative ileus, now resolving per MD notes. Surgery is following.    Assessment / Plan / Recommendation  Clinical Impression    Pt seen for BSE this morning. Pt A/O x4; engaged easily and talkative. Family member present. MOD++ Belching present. Pt is currently on a Clear liquid diet d/t Ileus. Pt c/o difficulty swallowing Pills.  On [place] O2 support- 3L; afebrile, WBC trending down.    OF NOTE:  Pt strongly endorse s/s of REFLUX and endorses BELCHING around/after meals. She is on a PPI currently.  CXR at admit: No active disease. Patchy BILATERAL pulmonary infiltrates favoring multifocal pneumonia since Ileus.   Pt appears to present w/ functional oropharyngeal phase swallowing w/ No overt oropharyngeal phase dysphagia appreciated during oral intake of po trials given. No neuromuscular swallowing deficits appreciated. Pt appears at reduced risk for aspiration from an oropharyngeal phase standpoint following general aspiration precautions.  HOWEVER, pt has a baseline presentation of GERD/REFLUX and episodes of REFLUX behavior, BELCHING. She also has dx'd Hiatal Hernia. ANY Dysmotility or Regurgitation of Reflux material can increase risk for pharyngeal phase swallowing difficulty or aspiration of the Reflux material during Retrograde flow thus impact Voicing and Pulmonary status. Pt described the BELCHING was not new  for her.    Pt sat upright in bed and consumed several trials of thin liquids Via Cup/Straw, 4 trials of purees(applesauce). No overt clinical s/s of aspiration noted; clear vocal quality b/t trials, no decline in pulmonary status, no multiple swallows noted post initial pharyngeal swallow, no decline in O2 sats(98%). Oral phase appeared [facility] for bolus management and timely A-P transfer/clearing of material. OM exam was [facility] for oral clearing; lingual/labial movements. No unilateral weakness. Speech clear.    Recommend continue a Clear liquids diet as per Surgery; can upgrade to a Regular consistency diet (moistened foods) when appropriate. Thin liquids. General aspiration precautions. Rest Breaks during meals/oral intake to allow for Esophageal clearing. REFLUX precautions strongly recommended to lessen chance for Regurgitation -- HOB elevated at night when sleeping.  IN REGARD TO PILL SWALLOWING: suspect the "pressure" or "stuck feeling" felt in the neck area that pt identifies is  related to Esophageal pressure/dysmotility occurring during swallowing/po intake in setting of MOD++ BELCHING also. Recommend 1 pill swallowed at a time OR Pills CRUSHED in a Puree, per pt's choosing for comfort. NSG updated and agreed.    Recommend pt f/u w/ GI for assessment/management of her GERD/Reflux and educate/tx as indicated. Discussion RELUX and impact of REFLUX on swallowing completed w/ pt; recommended monitoring tough to chew foods/meats. Recommend f/u w/ a Dietician for support. MD to reconsult ST services if any new needs while admitted. MD/NSG updated, agreed. SLP Visit Diagnosis: Dysphagia, unspecified (R13.10) (suspect Esophageal phase Dysmotility)    Aspiration Risk   (reduced following general precs.)    Diet Recommendation   continue a Clear liquids diet as per Surgery; can upgrade to a Regular consistency diet (moistened foods) when appropriate. Thin liquids. General aspiration precautions. Rest Breaks during meals/oral intake to allow for Esophageal clearing. REFLUX precautions strongly recommended to lessen chance for Regurgitation  Medication Administration: Whole meds with liquid (1 at a time OR CRUSHED in Puree)    Other  Recommendations Recommended Consults: Consider GI evaluation;Consider esophageal assessment (post D/C for education) Oral Care Recommendations: Oral care BID;Oral care before and after PO;Patient independent with oral care (setup)    Recommendations for follow up therapy are one component of a multi-disciplinary discharge planning process, led by the attending physician.  Recommendations may be updated based on patient status, additional functional criteria and insurance authorization.  Follow up Recommendations No SLP follow up      Assistance Recommended at Discharge  Intermittent-PRN  Functional Status Assessment Patient has had a recent decline in their functional status and demonstrates the ability to make significant improvements in function  in a reasonable and predictable amount of time.  Frequency and Duration  (n/a)   (n/a)       Prognosis Prognosis for improved oropharyngeal function: Good Barriers to Reach Goals: Time post onset;Severity of deficits Barriers/Prognosis Comment: Hiatal Hernia; GERD; BELCHING      Swallow Study   General Date of Onset: 02/01/23 HPI: Pt is an 82 y/o F admitted on 02/01/23 after presenting with c/o a fall. Pt found to have L impacted femoral neck hip fx & underwent percutaneous fixation on 02/02/23. PMH: HIatal Hernia, HTN, interstitial cystitis, DDD, GERD, hypercholesterolemia. Found to have post-operative ileus, now resolving per MD notes. Surgery is following. Type of Study: Bedside Swallow Evaluation Previous Swallow Assessment: none Diet Prior to this Study: Regular;Thin liquids (Level 0) Temperature Spikes Noted: No (WBC trending down, 12.5) Respiratory Status: Nasal cannula (3L) History of Recent Intubation: No Behavior/Cognition: Alert;Cooperative;Pleasant mood (  talkative) Oral Cavity Assessment: Within Functional Limits Oral Care Completed by SLP: Recent completion by staff Oral Cavity - Dentition: Adequate natural dentition (w/ partial) Vision: Functional for self-feeding Self-Feeding Abilities: Able to feed self Patient Positioning: Upright in bed (needed positioning support d/t slouched in bed) Baseline Vocal Quality: Normal Volitional Cough: Strong Volitional Swallow: Able to elicit    Oral/Motor/Sensory Function Overall Oral Motor/Sensory Function: Within functional limits   Ice Chips Ice chips: Not tested   Thin Liquid Thin Liquid: Within functional limits Presentation: Self Fed;Straw;Cup (~4-5 ozs total) Other Comments: water, tea, broth    Nectar Thick Nectar Thick Liquid: Not tested   Honey Thick Honey Thick Liquid: Not tested   Puree Puree: Within functional limits Presentation: Self Fed;Spoon (4 trials - applesauce)   Solid     Solid: Not tested Other  Comments: d/t surgery status         Jerilynn Som, MS, CCC-SLP Speech Language Pathologist Rehab Services; [facility] [place] [phone] (ascom) , 02/09/2023,4:08 PM

## 2023-02-09 NOTE — Progress Notes (Addendum)
[facility] SURGICAL PROGRESS NOTE (cpt [phone])  Hospital Day(s): 8.   Post op day(s): 7 Days Post-Op.   Interval History: Patient seen and examined, no acute events or new complaints overnight. Patient reports she is feeling slightly better. No abdominal pain. Some nausea with PO K+ repletion. No fever, chills, emesis. Previously seen leukocytosis is improving; now 12.5K (from 16.4K yesterday). Hgb to 10.2; stable. Renal function normal; sCr - 0.66; UO - 250 ccs + unmeasured. No electrolyte derangements. NGT output over the weekend. Currently on CLD; tolerating somewhat. Flatus; no BM. Trying to work with therapies   Review of Systems:  Constitutional: denies fever, chills  HEENT: denies cough or congestion  Respiratory: denies any shortness of breath  Cardiovascular: denies chest pain or palpitations  Gastrointestinal: denied abdominal pain, nausea, emesis Genitourinary: denies burning with urination or urinary frequency Musculoskeletal: denies pain, decreased motor or sensation  Vital signs in last 24 hours: [min-max] current  Temp:  [97.8 F (36.6 C)-98.1 F (36.7 C)] 97.9 F (36.6 C) (05/13 0400) Pulse Rate:  [65-105] 85 (05/13 0500) Resp:  [15-33] 20 (05/13 0500) BP: (131-194)/(47-155) 151/58 (05/13 0500) SpO2:  [90 %-99 %] 95 % (05/13 0500)     Height: 5\' 4"  (162.6 cm) Weight: 74.1 kg BMI (Calculated): 28.03   Intake/Output last 2 shifts:  05/12 0701 - 05/13 0700 In: 965.5 [P.O.:300; I.V.:421.4; IV Piggyback:244.1] Out: 250 [Urine:250]   Physical Exam:  Constitutional: alert, cooperative and no distress  HENT: normocephalic without obvious abnormality Eyes: PERRL, EOM's grossly intact and symmetric  Respiratory: breathing non-labored at rest; on [place] Gastrointestinal: soft, no overt tenderness, does not appear distended but is certainly tympanic, no rebound/guarding    Labs:     Latest Ref Rng & Units 02/09/2023    5:20 AM 02/08/2023    5:17 AM  02/07/2023    5:37 AM  CBC  WBC 4.0 - 10.5 K/uL 12.5  16.4  15.0   Hemoglobin 12.0 - 15.0 g/dL 19.1  47.8  29.5   Hematocrit 36.0 - 46.0 % 30.3  30.9  30.2   Platelets 150 - 400 K/uL 288  269  235       Latest Ref Rng & Units 02/09/2023    5:20 AM 02/08/2023    5:17 AM 02/07/2023    5:37 AM  CMP  Glucose 70 - 99 mg/dL 621  308  657   BUN 8 - 23 mg/dL 12  11  14    Creatinine 0.44 - 1.00 mg/dL 8.46  9.62  9.52   Sodium 135 - 145 mmol/L 141  139  138   Potassium 3.5 - 5.1 mmol/L 3.5  3.3  3.3   Chloride 98 - 111 mmol/L 110  113  111   CO2 22 - 32 mmol/L 22  19  20    Calcium 8.9 - 10.3 mg/dL 8.1  8.0  7.9      Imaging studies:  No new pertinent imaging studies    Assessment/Plan: (ICD-10's: K56.7) 82 y.o. female with clinically resolving post-operative ileus 7 Days Post-Op s/p percutaneous fixation of left femoral neck fracture.   - Continue CLD for now; Okay for hard candies/gum as well  - No need to replace NGT at this time - Monitor abdominal examination; on-going bowel function - No indication for surgical intervention - Serial KUBs as needed;  - Pain control prn; limit narcotics as feasible  - Mobilize as tolerated; okay to work with therapies as feasible    -  Further management per primary service; we will follow    All of the above findings and recommendations were discussed with the patient, patient's family at bedside, and the medical team, and all of patient's and family's questions were answered to their expressed satisfaction.  -- Lynden Oxford, PA-C [facility] 02/09/2023, 7:19 AM M-F: 7am - 4pm   Pt Seen and examined.  I agree with Mr. Gaynell Face. No need for surgical intervention, ileus resolving slowly.

## 2023-02-09 NOTE — Progress Notes (Signed)
Rounding Note    Patient Name: Rachael Austin Date of Encounter: 02/09/2023  [facility] Cardiologist: None    Subjective   Patient seen on a.m. rounds.  Denies any current chest pain or shortness of breath.  Endorses improved abdominal discomfort.  NG tube has been removed and she has been tolerating a clear liquid diet.  She is also continued on diltiazem drip at 15 mg/hr. she is also continued on labetalol and hydralazine with blood pressures continuing to run 130-150 systolic.  Inpatient Medications    Scheduled Meds:  docusate sodium  100 mg Oral BID   enoxaparin (LOVENOX) injection  1 mg/kg Subcutaneous Q12H   feeding supplement  1 Container Oral TID BM   hydrALAZINE  10 mg Intravenous Q6H   labetalol  10 mg Intravenous Q6H   multivitamin with minerals  1 tablet Oral Daily   pantoprazole (PROTONIX) IV  40 mg Intravenous Q12H   pravastatin  20 mg Oral q1800   senna  1 tablet Oral BID   Continuous Infusions:  sodium chloride Stopped (02/08/23 0255)   diltiazem (CARDIZEM) infusion 15 mg/hr (02/09/23 0332)   methocarbamol (ROBAXIN) IV Stopped (02/08/23 0030)   PRN Meds: sodium chloride, acetaminophen, alum & mag hydroxide-simeth, bisacodyl, HYDROcodone-acetaminophen, labetalol, menthol-cetylpyridinium **OR** phenol, methocarbamol **OR** methocarbamol (ROBAXIN) IV, morphine injection, mouth rinse, polyethylene glycol, prochlorperazine   Vital Signs    Vitals:   02/09/23 0400 02/09/23 0500 02/09/23 0800 02/09/23 0813  BP: 138/80 (!) 151/58 (!) 170/51   Pulse: 96 85 83   Resp: (!) 24 20 17    Temp: 97.9 F (36.6 C)   98.4 F (36.9 C)  TempSrc: Oral   Oral  SpO2: 92% 95% 95%   Weight:      Height:        Intake/Output Summary (Last 24 hours) at 02/09/2023 1150 Last data filed at 02/09/2023 0900 Gross per 24 hour  Intake 671.5 ml  Output 250 ml  Net 421.5 ml      02/06/2023    5:03 AM 02/05/2023    4:03 AM 02/04/2023    5:01 AM  Last 3 Weights   Weight (lbs) 163 lb 5.8 oz 156 lb 12 oz 157 lb 6.5 oz  Weight (kg) 74.1 kg 71.1 kg 71.4 kg      Telemetry    Sinus rhythm with PACs rates of 70-90- Personally Reviewed  ECG    No new tracings- Personally Reviewed  Physical Exam   GEN: No acute distress.   Neck: No JVD Cardiac: RRR, no murmurs, rubs, or gallops.  Respiratory: Clear to auscultation bilaterally. GI: Soft, nontender, non-distended  MS: No edema; No deformity. Neuro:  Nonfocal  Psych: Normal affect   Labs    High Sensitivity Troponin:   Recent Labs  Lab 02/03/23 1747 02/03/23 2329 02/04/23 0411  TROPONINIHS 15 17 18*     Chemistry Recent Labs  Lab 02/07/23 0537 02/08/23 0517 02/09/23 0520  NA 138 139 141  K 3.3* 3.3* 3.5  CL 111 113* 110  CO2 20* 19* 22  GLUCOSE 113* 125* 116*  BUN 14 11 12   CREATININE 0.70 0.60 0.66  CALCIUM 7.9* 8.0* 8.1*  MG 2.2 2.1 2.0  GFRNONAA >60 >60 >60  ANIONGAP 7 7 9     Lipids No results for input(s): "CHOL", "TRIG", "HDL", "LABVLDL", "LDLCALC", "CHOLHDL" in the last 168 hours.  Hematology Recent Labs  Lab 02/07/23 0537 02/08/23 0517 02/09/23 0520  WBC 15.0* 16.4* 12.5*  RBC 3.48*  3.52* 3.44*  HGB 10.1* 10.2* 10.0*  HCT 30.2* 30.9* 30.3*  MCV 86.8 87.8 88.1  MCH 29.0 29.0 29.1  MCHC 33.4 33.0 33.0  RDW 15.1 15.8* 16.0*  PLT 235 269 288   Thyroid No results for input(s): "TSH", "FREET4" in the last 168 hours.  BNPNo results for input(s): "BNP", "PROBNP" in the last 168 hours.  DDimer No results for input(s): "DDIMER" in the last 168 hours.   Radiology    No results found.  Cardiac Studies  TTE 02/03/23 1. Left ventricular ejection fraction, by estimation, is 65 to 70%. The  left ventricle has normal function. The left ventricle has no regional  wall motion abnormalities. There is mild left ventricular hypertrophy.  Left ventricular diastolic parameters  are consistent with Grade I diastolic dysfunction (impaired relaxation).  Elevated left atrial  pressure.   2. Right ventricular systolic function is mildly reduced. The right  ventricular size is normal. There is normal pulmonary artery systolic  pressure.   3. The mitral valve is normal in structure. Trivial mitral valve  regurgitation. No evidence of mitral stenosis.   4. Tricuspid valve regurgitation is mild to moderate.   5. The aortic valve has an indeterminant number of cusps. There is mild  calcification of the aortic valve. There is mild thickening of the aortic  valve. Aortic valve regurgitation is not visualized. Aortic valve  sclerosis/calcification is present,  without any evidence of aortic stenosis.   6. The inferior vena cava is normal in size with <50% respiratory  variability, suggesting right atrial pressure of 8 mmHg.    Patient Profile     82 y.o. female with a past medical history of hypertension, hyperlipidemia, aortic atherosclerosis, syncope, recurrent falls, who is being seen and evaluated for chest pain and abnormal EKG.  Assessment & Plan    Paroxysmal atrial fibrillation -Noted to be in sinus rhythm with PACs on telemetry monitoring -Remains on diltiazem drip at 15 mg/hr -Transition to oral diltiazem once patient tolerating p.o. medications -CHA2DS2-VASc score of at least 4 -Patient will require long-term DOAC, likely Eliquis -Currently on Lovenox -Consider starting DOAC when stable from orthopedic standpoint -Continue with telemetry monitoring  Atypical chest pain -Known history of aortic atherosclerosis -Presentation likely secondary to ileus -Symptoms improved after NG tube was placed -CT scan of the chest negative for PE -High-sensitivity troponins trended flat -Chest pain-free this morning -No ischemic changes noted on EKG -Continued on statin therapy  Hypertension urgency -Blood pressure 170/51 -Continued on hydralazine 10 mg IV every 6 hours, labetalol 10 mg IV every 6 hours, and diltiazem drip at 15 mg/h -Once she is taking oral  medications and transition medications to orals -Vital signs per unit protocol  Multifocal pneumonia/ acute hypoxic respiratory failure -continue to wean FiO2 to maintain O2 sats greater than equal to 92% -antibiotic therapy per IM, just finished 7 days of antibiotics -management per IM  Left hip fracture -Status post mechanical fall -Postop day 7 percutaneous fixation of the left femoral neck -Management per orthopedics and IM      CHA2DS2-VASc Score = 4   This indicates a 4.8% annual risk of stroke. The patient's score is based upon: CHF History: 0 HTN History: 1 Diabetes History: 0 Stroke History: 0 Vascular Disease History: 0 Age Score: 2 Gender Score: 1      For questions or updates, please contact [facility] Please consult [email or web address] for contact info under        Signed,  ,  NP  02/09/2023, 11:50 AM

## 2023-02-09 NOTE — Progress Notes (Signed)
Progress Note    Rachael Austin  KGM:[id number] DOB: 31-Dec-1940  DOA: 02/01/2023 PCP: Dale Millry, MD      Brief Narrative:    Medical records reviewed and are as summarized below:  Rachael Austin is a 82 y.o. female with a history significant for hypertension, interstitial cystitis, aortic atherosclerosis, syncope, recurrent falls, who presented to the hospital because of mechanical fall at home.   She was found to have left hip fracture.     Assessment/Plan:   Principal Problem:   Closed left hip fracture, initial encounter (HCC) Active Problems:   Hypercholesterolemia   Hypertensive urgency   Prolonged QT interval   Subcapital fracture of hip, left, closed, initial encounter (HCC)   PAF (paroxysmal atrial fibrillation) (HCC)   Ileus (HCC)   Ileus, postoperative (HCC)   Multifocal pneumonia   Acute respiratory failure with hypoxia (HCC)   Hypokalemia   Nutrition Problem: Increased nutrient needs Etiology: post-op healing  Signs/Symptoms: estimated needs   Body mass index is 28.04 kg/m.    Left hip fracture: S/p percutaneous fixation of left femoral neck hip fracture on 02/02/2023.  Follow-up with orthopedic surgeon.   Multifocal pneumonia: Completed 7 days of ceftriaxone and doxycycline on 02/08/2023.     Acute hypoxic respiratory failure: She remains on 3 L/min oxygen via [place].  Taper off oxygen as able.     Ileus: NG tube has been removed.  She is tolerating clear diet.     Chest pain/epigastric pain: Improved.  No acute PE on CT chest.  No plan for ischemic workup.   Paroxysmal atrial fibrillation with RVR: She is in normal sinus rhythm.  She still on IV Cardizem drip.  Continue therapeutic dose of Lovenox.  Follow-up with cardiologist for further recommendations. 2D echo showed EF estimated at 65 to 70%, mild LVH, grade 1 diastolic dysfunction, mild to moderate TR   Hypokalemia: Improved.  Continue potassium repletion.   AKI:  Improved   Hypertensive urgency, hypertension: BP has improved.   Prolonged QTc interval: Improved from 500-454.   Continue PT and OT   Diet Order             Diet clear liquid Room service appropriate? Yes; Fluid consistency: Thin  Diet effective now                            Consultants: Orthopedic surgeon Cardiologist General surgeon  Procedures: Percutaneous fixation of left femoral neck fracture on 02/02/2023    Medications:    docusate sodium  100 mg Oral BID   enoxaparin (LOVENOX) injection  1 mg/kg Subcutaneous Q12H   feeding supplement  1 Container Oral TID BM   hydrALAZINE  10 mg Intravenous Q6H   labetalol  10 mg Intravenous Q6H   multivitamin with minerals  1 tablet Oral Daily   pantoprazole (PROTONIX) IV  40 mg Intravenous Q12H   pravastatin  20 mg Oral q1800   senna  1 tablet Oral BID   Continuous Infusions:  sodium chloride Stopped (02/08/23 0255)   diltiazem (CARDIZEM) infusion 15 mg/hr (02/09/23 1208)   methocarbamol (ROBAXIN) IV Stopped (02/08/23 0030)     Anti-infectives (From admission, onward)    Start     Dose/Rate Route Frequency Ordered Stop   02/04/23 1200  doxycycline (VIBRAMYCIN) 100 mg in sodium chloride 0.9 % 250 mL IVPB        100 mg 125 mL/hr over 120 Minutes Intravenous  2 times daily 02/04/23 1105 02/09/23 0130   02/03/23 2100  cefTRIAXone (ROCEPHIN) 1 g in sodium chloride 0.9 % 100 mL IVPB        1 g 200 mL/hr over 30 Minutes Intravenous Every 24 hours 02/03/23 1958 02/07/23 2352   02/02/23 2000  ceFAZolin (ANCEF) IVPB 2g/100 mL premix        2 g 200 mL/hr over 30 Minutes Intravenous Every 6 hours 02/02/23 1902 02/03/23 0851   02/02/23 0600  ceFAZolin (ANCEF) IVPB 2g/100 mL premix        2 g 200 mL/hr over 30 Minutes Intravenous On call to O.R. 02/01/23 2236 02/02/23 1330              Family Communication/Anticipated D/C date and plan/Code Status   DVT prophylaxis: SCDs Start: 02/02/23  1902 Place TED hose Start: 02/02/23 1902 SCDs Start: 02/01/23 2015     Code Status: Full Code  Family Communication:  Annabelle Harman, daughter and Carollee Herter, granddaughter, were at the bedside. Disposition Plan: May be ready for discharge in about 2 to 3 days   Status is: Inpatient Remains inpatient appropriate because: Ileus, paroxysmal atrial fibrillation       Subjective:   Interval events noted.  No abdominal pain, nausea or vomiting.  No shortness of breath or chest pain.  She feels better.  Annabelle Harman, daughter and Carollee Herter, granddaughter, were at the bedside.  Objective:    Vitals:   02/09/23 0400 02/09/23 0500 02/09/23 0800 02/09/23 0813  BP: 138/80 (!) 151/58 (!) 170/51   Pulse: 96 85 83   Resp: (!) 24 20 17    Temp: 97.9 F (36.6 C)   98.4 F (36.9 C)  TempSrc: Oral   Oral  SpO2: 92% 95% 95%   Weight:      Height:       No data found.   Intake/Output Summary (Last 24 hours) at 02/09/2023 1422 Last data filed at 02/09/2023 0900 Gross per 24 hour  Intake 671.5 ml  Output 250 ml  Net 421.5 ml   Filed Weights   02/04/23 0501 02/05/23 0403 02/06/23 0503  Weight: 71.4 kg 71.1 kg 74.1 kg    Exam:  GEN: NAD SKIN: Warm and dry EYES: Anicteric ENT: MMM CV: RRR PULM: CTA B ABD: soft, distended but non tender, +BS CNS: AAO x 3, non focal EXT: No edema or tenderness    Data Reviewed:   I have personally reviewed following labs and imaging studies:  Labs: Labs show the following:   Basic Metabolic Panel: Recent Labs  Lab 02/05/23 0320 02/06/23 0455 02/07/23 0537 02/08/23 0517 02/09/23 0520  NA 134* 134* 138 139 141  K 3.5 3.2* 3.3* 3.3* 3.5  CL 104 105 111 113* 110  CO2 22 21* 20* 19* 22  GLUCOSE 120* 110* 113* 125* 116*  BUN 15 12 14 11 12   CREATININE 0.65 0.64 0.70 0.60 0.66  CALCIUM 7.7* 7.7* 7.9* 8.0* 8.1*  MG 2.0 2.1 2.2 2.1 2.0  PHOS  --   --  2.2* 2.2* 2.7   GFR Estimated Creatinine Clearance: 52.6 mL/min (by C-G formula based on SCr of 0.66  mg/dL). Liver Function Tests: No results for input(s): "AST", "ALT", "ALKPHOS", "BILITOT", "PROT", "ALBUMIN" in the last 168 hours. No results for input(s): "LIPASE", "AMYLASE" in the last 168 hours. No results for input(s): "AMMONIA" in the last 168 hours. Coagulation profile No results for input(s): "INR", "PROTIME" in the last 168 hours.  CBC: Recent Labs  Lab 02/05/23  0320 02/06/23 0455 02/07/23 0537 02/08/23 0517 02/09/23 0520  WBC 13.3* 13.9* 15.0* 16.4* 12.5*  NEUTROABS  --  10.6* 10.4* 11.6*  --   HGB 10.4* 10.3* 10.1* 10.2* 10.0*  HCT 30.8* 30.8* 30.2* 30.9* 30.3*  MCV 85.6 86.3 86.8 87.8 88.1  PLT 180 231 235 269 288   Cardiac Enzymes: No results for input(s): "CKTOTAL", "CKMB", "CKMBINDEX", "TROPONINI" in the last 168 hours. BNP (last 3 results) No results for input(s): "PROBNP" in the last 8760 hours. CBG: Recent Labs  Lab 02/03/23 1712 02/03/23 1916 02/03/23 2121  GLUCAP 148* 144* 136*   D-Dimer: No results for input(s): "DDIMER" in the last 72 hours. Hgb A1c: No results for input(s): "HGBA1C" in the last 72 hours. Lipid Profile: No results for input(s): "CHOL", "HDL", "LDLCALC", "TRIG", "CHOLHDL", "LDLDIRECT" in the last 72 hours. Thyroid function studies: No results for input(s): "TSH", "T4TOTAL", "T3FREE", "THYROIDAB" in the last 72 hours.  Invalid input(s): "FREET3" Anemia work up: No results for input(s): "VITAMINB12", "FOLATE", "FERRITIN", "TIBC", "IRON", "RETICCTPCT" in the last 72 hours. Sepsis Labs: Recent Labs  Lab 02/06/23 0455 02/07/23 0537 02/08/23 0517 02/09/23 0520  WBC 13.9* 15.0* 16.4* 12.5*    Microbiology No results found for this or any previous visit (from the past 240 hour(s)).  Procedures and diagnostic studies:  No results found.             LOS: 8 days      [facility]   Pager on [email or web address]. If 7PM-7AM, please contact night-coverage at [email or web address]     02/09/2023, 2:22 PM

## 2023-02-10 DIAGNOSIS — K9189 Other postprocedural complications and disorders of digestive system: Secondary | ICD-10-CM | POA: Diagnosis not present

## 2023-02-10 DIAGNOSIS — I10 Essential (primary) hypertension: Secondary | ICD-10-CM | POA: Diagnosis not present

## 2023-02-10 DIAGNOSIS — K567 Ileus, unspecified: Secondary | ICD-10-CM | POA: Diagnosis not present

## 2023-02-10 DIAGNOSIS — R079 Chest pain, unspecified: Secondary | ICD-10-CM | POA: Diagnosis not present

## 2023-02-10 DIAGNOSIS — J189 Pneumonia, unspecified organism: Secondary | ICD-10-CM | POA: Diagnosis not present

## 2023-02-10 DIAGNOSIS — S72002A Fracture of unspecified part of neck of left femur, initial encounter for closed fracture: Secondary | ICD-10-CM | POA: Diagnosis not present

## 2023-02-10 DIAGNOSIS — R9431 Abnormal electrocardiogram [ECG] [EKG]: Secondary | ICD-10-CM | POA: Diagnosis not present

## 2023-02-10 MED ORDER — CARVEDILOL 25 MG PO TABS
25.0000 mg | ORAL_TABLET | Freq: Two times a day (BID) | ORAL | Status: DC
  Administered 2023-02-10 – 2023-02-12 (×5): 25 mg via ORAL
  Filled 2023-02-10 (×5): qty 1

## 2023-02-10 MED ORDER — APIXABAN 5 MG PO TABS
5.0000 mg | ORAL_TABLET | Freq: Two times a day (BID) | ORAL | Status: DC
  Administered 2023-02-10 – 2023-02-12 (×5): 5 mg via ORAL
  Filled 2023-02-10 (×4): qty 1

## 2023-02-10 MED ORDER — HYDRALAZINE HCL 25 MG PO TABS
25.0000 mg | ORAL_TABLET | Freq: Two times a day (BID) | ORAL | Status: DC
  Administered 2023-02-10 – 2023-02-12 (×4): 25 mg via ORAL
  Filled 2023-02-10 (×4): qty 1

## 2023-02-10 NOTE — Progress Notes (Signed)
Rounding Note    Patient Name: Rachael Austin Date of Encounter: 02/10/2023  [facility] Cardiologist: None   Subjective   Patient seen on rounds.  Denies any current chest pain or shortness of breath.  Is tolerating oral medications since having NG tube removed.  Continues to maintain sinus rhythm on telemetry monitoring.  Inpatient Medications    Scheduled Meds:  apixaban  5 mg Oral BID   carvedilol  25 mg Oral BID WC   docusate sodium  100 mg Oral BID   feeding supplement  1 Container Oral TID BM   hydrALAZINE  25 mg Oral BID   multivitamin with minerals  1 tablet Oral Daily   pravastatin  20 mg Oral q1800   senna  1 tablet Oral BID   Continuous Infusions:  sodium chloride Stopped (02/08/23 0255)   methocarbamol (ROBAXIN) IV Stopped (02/08/23 0030)   PRN Meds: sodium chloride, acetaminophen, alum & mag hydroxide-simeth, bisacodyl, HYDROcodone-acetaminophen, labetalol, menthol-cetylpyridinium **OR** phenol, methocarbamol **OR** methocarbamol (ROBAXIN) IV, morphine injection, mouth rinse, polyethylene glycol, prochlorperazine   Vital Signs    Vitals:   02/10/23 1200 02/10/23 1300 02/10/23 1530 02/10/23 1556  BP: (!) 127/53   (!) 131/59  Pulse: 70 70 66 70  Resp: 18 17 15 17   Temp: 98.6 F (37 C)  98.3 F (36.8 C)   TempSrc: Oral  Oral   SpO2: 99% 96% 95% 96%  Weight:      Height:        Intake/Output Summary (Last 24 hours) at 02/10/2023 1638 Last data filed at 02/10/2023 1429 Gross per 24 hour  Intake 685.01 ml  Output 301 ml  Net 384.01 ml      02/06/2023    5:03 AM 02/05/2023    4:03 AM 02/04/2023    5:01 AM  Last 3 Weights  Weight (lbs) 163 lb 5.8 oz 156 lb 12 oz 157 lb 6.5 oz  Weight (kg) 74.1 kg 71.1 kg 71.4 kg      Telemetry    Sinus rhythm rates of 70s and 80s- Personally Reviewed  ECG    No new tracings- Personally Reviewed  Physical Exam   GEN: No acute distress.   Neck: No JVD Cardiac: RRR, no murmurs, rubs, or  gallops.  Respiratory: Clear to auscultation bilaterally. GI: Soft, nontender, non-distended  MS: No edema; No deformity. Neuro:  Nonfocal  Psych: Normal affect   Labs    High Sensitivity Troponin:   Recent Labs  Lab 02/03/23 1747 02/03/23 2329 02/04/23 0411  TROPONINIHS 15 17 18*     Chemistry Recent Labs  Lab 02/07/23 0537 02/08/23 0517 02/09/23 0520  NA 138 139 141  K 3.3* 3.3* 3.5  CL 111 113* 110  CO2 20* 19* 22  GLUCOSE 113* 125* 116*  BUN 14 11 12   CREATININE 0.70 0.60 0.66  CALCIUM 7.9* 8.0* 8.1*  MG 2.2 2.1 2.0  GFRNONAA >60 >60 >60  ANIONGAP 7 7 9     Lipids No results for input(s): "CHOL", "TRIG", "HDL", "LABVLDL", "LDLCALC", "CHOLHDL" in the last 168 hours.  Hematology Recent Labs  Lab 02/07/23 0537 02/08/23 0517 02/09/23 0520  WBC 15.0* 16.4* 12.5*  RBC 3.48* 3.52* 3.44*  HGB 10.1* 10.2* 10.0*  HCT 30.2* 30.9* 30.3*  MCV 86.8 87.8 88.1  MCH 29.0 29.0 29.1  MCHC 33.4 33.0 33.0  RDW 15.1 15.8* 16.0*  PLT 235 269 288   Thyroid No results for input(s): "TSH", "FREET4" in the last 168 hours.  BNPNo results for input(s): "BNP", "PROBNP" in the last 168 hours.  DDimer No results for input(s): "DDIMER" in the last 168 hours.   Radiology    No results found.  Cardiac Studies   TTE 02/03/23 1. Left ventricular ejection fraction, by estimation, is 65 to 70%. The  left ventricle has normal function. The left ventricle has no regional  wall motion abnormalities. There is mild left ventricular hypertrophy.  Left ventricular diastolic parameters  are consistent with Grade I diastolic dysfunction (impaired relaxation).  Elevated left atrial pressure.   2. Right ventricular systolic function is mildly reduced. The right  ventricular size is normal. There is normal pulmonary artery systolic  pressure.   3. The mitral valve is normal in structure. Trivial mitral valve  regurgitation. No evidence of mitral stenosis.   4. Tricuspid valve regurgitation is  mild to moderate.   5. The aortic valve has an indeterminant number of cusps. There is mild  calcification of the aortic valve. There is mild thickening of the aortic  valve. Aortic valve regurgitation is not visualized. Aortic valve  sclerosis/calcification is present,  without any evidence of aortic stenosis.   6. The inferior vena cava is normal in size with <50% respiratory  variability, suggesting right atrial pressure of 8 mmHg.   Patient Profile     82 y.o. female with a past medical history of hypertension, hyperlipoidemia, aortic atherosclerosis, syncope, recurrent falls, has been seen and evaluated for chest pain and abnormal EKG.  Assessment & Plan    Paroxysmal atrial fibrillation -Maintaining sinus rhythm on telemetry -Continued on apixaban 5 mg twice daily for CHA2DS2-VASc score of at least 4 -Continued on carvedilol 25 mg twice daily -Continue with telemetry monitoring  Atypical chest pain -Continues to remain chest pain-free -History of aortic atherosclerosis -Presentation likely secondary to ileus requiring NG tube -CT scan of the chest negative for PE -High-sensitivity troponin trended flat -No ischemic changes noted on EKG -Continued on apixaban and lieu of aspirin and statin therapy -No plans for ischemic workup during hospitalization  Essential hypertension -Blood pressure 131/59 -Continued on carvedilol -Hydralazine has been ordered but has yet to be given -Vital signs per unit protocol  Multifocal pneumonia/acute hypoxic respiratory failure -Recently finished antibiotic therapy -Management per IM  Left hip fracture -Status post mechanical fall -Postop day 8 percutaneous fixation of the left femoral neck -Management per orthopedics and IM     For questions or updates, please contact [facility] Please consult [email or web address] for contact info under        Signed,  , NP  02/10/2023, 4:38 PM

## 2023-02-10 NOTE — Progress Notes (Signed)
Progress Note    Rachael Austin  ZOX:[id number] DOB: 07/29/1941  DOA: 02/01/2023 PCP: Dale [place], MD      Brief Narrative:    Medical records reviewed and are as summarized below:  Rachael Austin is a 82 y.o. female with a history significant for hypertension, interstitial cystitis, aortic atherosclerosis, syncope, recurrent falls, who presented to the hospital because of mechanical fall at home.   She was found to have left hip fracture.     Assessment/Plan:   Principal Problem:   Closed left hip fracture, initial encounter (HCC) Active Problems:   Hypercholesterolemia   Hypertensive urgency   Prolonged QT interval   Subcapital fracture of hip, left, closed, initial encounter (HCC)   PAF (paroxysmal atrial fibrillation) (HCC)   Ileus (HCC)   Ileus, postoperative (HCC)   Multifocal pneumonia   Acute respiratory failure with hypoxia (HCC)   Hypokalemia   Nutrition Problem: Increased nutrient needs Etiology: post-op healing  Signs/Symptoms: estimated needs   Body mass index is 28.04 kg/m.    Left hip fracture: S/p percutaneous fixation of left femoral neck hip fracture on 02/02/2023.  Follow-up with orthopedic surgeon.   Multifocal pneumonia: Completed 7 days of ceftriaxone and doxycycline on 02/08/2023.     Acute hypoxic respiratory failure: Resolved.  She is tolerating room air.   Ileus: NG tube has been removed and she is tolerating full liquid diet.   Chest pain/epigastric pain: Improved.  No acute PE on CT chest.  No plan for ischemic workup.   Paroxysmal atrial fibrillation with RVR: She is in normal sinus rhythm.  Discontinue IV Cardizem and therapeutic dose of Lovenox.  She has been started on carvedilol and Eliquis.  Potential side effects of new medications discussed with patient and her daughter at the bedside.  They are okay with starting carvedilol and Eliquis. Follow-up with cardiologist for further recommendations. 2D echo  showed EF estimated at 65 to 70%, mild LVH, grade 1 diastolic dysfunction, mild to moderate TR   Hypokalemia: Improved.  Repeat BMP tomorrow   AKI: Improved   Hypertensive urgency, hypertension: Resume home hydralazine.  She was started on carvedilol.  Discontinue IV labetalol and IV Cardizem drip   Prolonged QTc interval: Improved from 500-454.   Continue PT and OT   Diet Order             Diet full liquid Fluid consistency: Thin  Diet effective now                            Consultants: Orthopedic surgeon Cardiologist General surgeon  Procedures: Percutaneous fixation of left femoral neck fracture on 02/02/2023    Medications:    apixaban  5 mg Oral BID   carvedilol  25 mg Oral BID WC   docusate sodium  100 mg Oral BID   feeding supplement  1 Container Oral TID BM   hydrALAZINE  25 mg Oral BID   multivitamin with minerals  1 tablet Oral Daily   pravastatin  20 mg Oral q1800   senna  1 tablet Oral BID   Continuous Infusions:  sodium chloride Stopped (02/08/23 0255)   methocarbamol (ROBAXIN) IV Stopped (02/08/23 0030)     Anti-infectives (From admission, onward)    Start     Dose/Rate Route Frequency Ordered Stop   02/04/23 1200  doxycycline (VIBRAMYCIN) 100 mg in sodium chloride 0.9 % 250 mL IVPB  100 mg 125 mL/hr over 120 Minutes Intravenous 2 times daily 02/04/23 1105 02/09/23 0130   02/03/23 2100  cefTRIAXone (ROCEPHIN) 1 g in sodium chloride 0.9 % 100 mL IVPB        1 g 200 mL/hr over 30 Minutes Intravenous Every 24 hours 02/03/23 1958 02/07/23 2352   02/02/23 2000  ceFAZolin (ANCEF) IVPB 2g/100 mL premix        2 g 200 mL/hr over 30 Minutes Intravenous Every 6 hours 02/02/23 1902 02/03/23 0851   02/02/23 0600  ceFAZolin (ANCEF) IVPB 2g/100 mL premix        2 g 200 mL/hr over 30 Minutes Intravenous On call to O.R. 02/01/23 2236 02/02/23 1330              Family Communication/Anticipated D/C date and plan/Code Status    DVT prophylaxis: SCDs Start: 02/02/23 1902 Place TED hose Start: 02/02/23 1902 SCDs Start: 02/01/23 2015 apixaban (ELIQUIS) tablet 5 mg     Code Status: Full Code  Family Communication: Plan discussed with [name], daughter, at the bedside  Disposition Plan: Plan to discharge home in 2 to 3 days   Status is: Inpatient Remains inpatient appropriate because: Ileus, paroxysmal atrial fibrillation       Subjective:   Interval events noted.  No nausea, vomiting or abdominal pain.  No shortness of breath or chest pain.  She feels better.  [name], daughter, was at the bedside.  Objective:    Vitals:   02/10/23 1200 02/10/23 1300 02/10/23 1530 02/10/23 1556  BP: (!) 127/53   (!) 131/59  Pulse: 70 70 66 70  Resp: 18 17 15 17   Temp: 98.6 F (37 C)  98.3 F (36.8 C)   TempSrc: Oral  Oral   SpO2: 99% 96% 95% 96%  Weight:      Height:       No data found.   Intake/Output Summary (Last 24 hours) at 02/10/2023 1644 Last data filed at 02/10/2023 1429 Gross per 24 hour  Intake 685.01 ml  Output 301 ml  Net 384.01 ml   Filed Weights   02/04/23 0501 02/05/23 0403 02/06/23 0503  Weight: 71.4 kg 71.1 kg 74.1 kg    Exam:  GEN: NAD SKIN: Warm and dry EYES: EOMI ENT: MMM CV: RRR PULM: CTA B ABD: soft, mildly distended, NT, +BS CNS: AAO x 3, non focal EXT: No edema or tenderness   Data Reviewed:   I have personally reviewed following labs and imaging studies:  Labs: Labs show the following:   Basic Metabolic Panel: Recent Labs  Lab 02/05/23 0320 02/06/23 0455 02/07/23 0537 02/08/23 0517 02/09/23 0520  NA 134* 134* 138 139 141  K 3.5 3.2* 3.3* 3.3* 3.5  CL 104 105 111 113* 110  CO2 22 21* 20* 19* 22  GLUCOSE 120* 110* 113* 125* 116*  BUN 15 12 14 11 12   CREATININE 0.65 0.64 0.70 0.60 0.66  CALCIUM 7.7* 7.7* 7.9* 8.0* 8.1*  MG 2.0 2.1 2.2 2.1 2.0  PHOS  --   --  2.2* 2.2* 2.7   GFR Estimated Creatinine Clearance: 52.6 mL/min (by C-G formula based on SCr  of 0.66 mg/dL). Liver Function Tests: No results for input(s): "AST", "ALT", "ALKPHOS", "BILITOT", "PROT", "ALBUMIN" in the last 168 hours. No results for input(s): "LIPASE", "AMYLASE" in the last 168 hours. No results for input(s): "AMMONIA" in the last 168 hours. Coagulation profile No results for input(s): "INR", "PROTIME" in the last 168 hours.  CBC: Recent  Labs  Lab 02/05/23 0320 02/06/23 0455 02/07/23 0537 02/08/23 0517 02/09/23 0520  WBC 13.3* 13.9* 15.0* 16.4* 12.5*  NEUTROABS  --  10.6* 10.4* 11.6*  --   HGB 10.4* 10.3* 10.1* 10.2* 10.0*  HCT 30.8* 30.8* 30.2* 30.9* 30.3*  MCV 85.6 86.3 86.8 87.8 88.1  PLT 180 231 235 269 288   Cardiac Enzymes: No results for input(s): "CKTOTAL", "CKMB", "CKMBINDEX", "TROPONINI" in the last 168 hours. BNP (last 3 results) No results for input(s): "PROBNP" in the last 8760 hours. CBG: Recent Labs  Lab 02/03/23 1712 02/03/23 1916 02/03/23 2121  GLUCAP 148* 144* 136*   D-Dimer: No results for input(s): "DDIMER" in the last 72 hours. Hgb A1c: No results for input(s): "HGBA1C" in the last 72 hours. Lipid Profile: No results for input(s): "CHOL", "HDL", "LDLCALC", "TRIG", "CHOLHDL", "LDLDIRECT" in the last 72 hours. Thyroid function studies: No results for input(s): "TSH", "T4TOTAL", "T3FREE", "THYROIDAB" in the last 72 hours.  Invalid input(s): "FREET3" Anemia work up: No results for input(s): "VITAMINB12", "FOLATE", "FERRITIN", "TIBC", "IRON", "RETICCTPCT" in the last 72 hours. Sepsis Labs: Recent Labs  Lab 02/06/23 0455 02/07/23 0537 02/08/23 0517 02/09/23 0520  WBC 13.9* 15.0* 16.4* 12.5*    Microbiology No results found for this or any previous visit (from the past 240 hour(s)).  Procedures and diagnostic studies:  No results found.             LOS: 9 days      [employer] loss adjuster on [email or web address]. If 7PM-7AM, please contact night-coverage at [email or web address]     02/10/2023,  4:44 PM

## 2023-02-10 NOTE — Progress Notes (Signed)
Physical Therapy Treatment Patient Details Name: Rachael Austin MRN: [id number] DOB: [birth year] Today's Date: 02/10/2023   History of Present Illness Pt is an 82 y/o F admitted on 02/01/23 after presenting with c/o a fall. Pt found to have L impacted femoral neck hip fx & underwent percutaneous fixation on 02/02/23. PMH: HTN, interstitial cystitis, DDD, GERD, hypercholesterolemia. Found to have post-operative ileus    PT Comments    Pt on BSC with OT upon arrival and care is transferred.  She is only able to void at this time and does not feel like more time will result in BM.  She is able to attempt self care but does need help to complete task.  Stands with min a x 1 and is steady while assisting in pulling up her pants with 1 hand on walker.  Pivots to recliner with RW and min a x 1.  Fatigued with task and unable to stand longer for mobility.  Transitions to sitting and does do seated AROM x 10.  Remained up after session.  General malaise and fatigue continue to limit activity tolerance.   Recommendations for follow up therapy are one component of a multi-disciplinary discharge planning process, led by the attending physician.  Recommendations may be updated based on patient status, additional functional criteria and insurance authorization.  Follow Up Recommendations       Assistance Recommended at Discharge Frequent or constant Supervision/Assistance  Patient can return home with the following Assist for transportation;Assistance with cooking/housework;Direct supervision/assist for financial management;Help with stairs or ramp for entrance;Direct supervision/assist for medications management;A little help with walking and/or transfers;A little help with bathing/dressing/bathroom   Equipment Recommendations       Recommendations for Other Services       Precautions / Restrictions Precautions Precautions: Fall Restrictions Weight Bearing Restrictions: Yes LLE Weight Bearing:  Partial weight bearing LLE Partial Weight Bearing Percentage or Pounds: 25%     Mobility  Bed Mobility               General bed mobility comments: on BSC upon arrival and then in recliner    Transfers Overall transfer level: Needs assistance Equipment used: Rolling walker (2 wheels) Transfers: Bed to chair/wheelchair/BSC Sit to Stand: Min assist   Step pivot transfers: Min assist            Ambulation/Gait Ambulation/Gait assistance: Min assist, Min guard Gait Distance (Feet): 3 Feet Assistive device: Rolling walker (2 wheels) Gait Pattern/deviations: Step-to pattern, Decreased step length - right, Decreased step length - left Gait velocity: decreased     General Gait Details: small shuffling steps to recliner   Stairs             Wheelchair Mobility    Modified Rankin (Stroke Patients Only)       Balance Overall balance assessment: Needs assistance Sitting-balance support: No upper extremity supported, Feet supported Sitting balance-Leahy Scale: Good     Standing balance support: Bilateral upper extremity supported, During functional activity, Single extremity supported, Reliant on assistive device for balance Standing balance-Leahy Scale: Fair Standing balance comment: able to complete clothing mgt during toileting with 1 UE while keeping other UE on RW for stability                            Cognition Arousal/Alertness: Awake/alert Behavior During Therapy: WFL for tasks assessed/performed Overall Cognitive Status: Within Functional Limits for tasks assessed  Exercises Other Exercises Other Exercises: seated AROM x 10    General Comments General comments (skin integrity, edema, etc.): SpO2 95% on room air      Pertinent Vitals/Pain Pain Assessment Pain Assessment: Faces Faces Pain Scale: Hurts little more Pain Location: abdomen Pain Descriptors / Indicators:  Aching, Sore Pain Intervention(s): Limited activity within patient's tolerance, Monitored during session, Repositioned    Home Living                          Prior Function            PT Goals (current goals can now be found in the care plan section) Progress towards PT goals: Progressing toward goals    Frequency    7X/week      PT Plan Current plan remains appropriate    Co-evaluation              AM-PAC PT "6 Clicks" Mobility   Outcome Measure  Help needed turning from your back to your side while in a flat bed without using bedrails?: A Little Help needed moving from lying on your back to sitting on the side of a flat bed without using bedrails?: A Lot Help needed moving to and from a bed to a chair (including a wheelchair)?: A Little Help needed standing up from a chair using your arms (e.g., wheelchair or bedside chair)?: A Little Help needed to walk in hospital room?: A Little Help needed climbing 3-5 steps with a railing? : Total 6 Click Score: 15    End of Session   Activity Tolerance: Patient tolerated treatment well;Patient limited by fatigue Patient left: in chair;with call bell/phone within reach;with chair alarm set Nurse Communication: Mobility status;Weight bearing status PT Visit Diagnosis: Difficulty in walking, not elsewhere classified (R26.2);Other abnormalities of gait and mobility (R26.89);Muscle weakness (generalized) (M62.81)     Time: 1610-9604 PT Time Calculation (min) (ACUTE ONLY): 12 min  Charges:  $Therapeutic Exercise: 8-22 mins                   Danielle Dess, PTA 02/10/23, 2:50 PM

## 2023-02-10 NOTE — Progress Notes (Signed)
OT Cancellation Note  Patient Details Name: Rachael Austin MRN: [id number] DOB: Mar 17, 1941   Cancelled Treatment:    Reason Eval/Treat Not Completed: Patient declined, no reason specified. Pt declined upon initial attempt 2/2 LLE pain. RN notified. Agreeable to re-attempt this afternoon.   Arman Filter., MPH, MS, OTR/L ascom [phone] 02/10/23, 11:46 AM

## 2023-02-10 NOTE — Progress Notes (Signed)
Occupational Therapy Treatment Patient Details Name: CALEAH CERRILLO MRN: [id number] DOB: 1940-11-12 Today's Date: 02/10/2023   History of present illness Pt is an 82 y/o F admitted on 02/01/23 after presenting with c/o a fall. Pt found to have L impacted femoral neck hip fx & underwent percutaneous fixation on 02/02/23. PMH: HTN, interstitial cystitis, DDD, GERD, hypercholesterolemia. Found to have post-operative ileus   OT comments  Pt seen for OT tx. Pt endorses feeling a bit better and agreeable to session. Pt notes continued back discomfort, 4/10 but LLE feeling better. Pt up in recliner, stands and completes step pivot with RW and MIN A to BSC with VC for PWBing to LLE. Pt denies increase in pain. SpO2 95% on room air. PT in with pt at end of session to continue with therapy efforts. Pt progressing, continue with POC.    Recommendations for follow up therapy are one component of a multi-disciplinary discharge planning process, led by the attending physician.  Recommendations may be updated based on patient status, additional functional criteria and insurance authorization.    Assistance Recommended at Discharge Frequent or constant Supervision/Assistance  Patient can return home with the following  A lot of help with walking and/or transfers;A lot of help with bathing/dressing/bathroom;Help with stairs or ramp for entrance   Equipment Recommendations  BSC/3in1    Recommendations for Other Services      Precautions / Restrictions Precautions Precautions: Fall Restrictions Weight Bearing Restrictions: Yes LLE Weight Bearing: Partial weight bearing LLE Partial Weight Bearing Percentage or Pounds: 25%       Mobility Bed Mobility               General bed mobility comments: NT in recliner at start and end of session    Transfers Overall transfer level: Needs assistance Equipment used: Rolling walker (2 wheels) Transfers: Sit to/from Stand, Bed to  chair/wheelchair/BSC Sit to Stand: Min assist     Step pivot transfers: Min assist     General transfer comment: VC for PWBing     Balance Overall balance assessment: Needs assistance Sitting-balance support: No upper extremity supported, Feet supported Sitting balance-Leahy Scale: Good     Standing balance support: Bilateral upper extremity supported, During functional activity, Single extremity supported, Reliant on assistive device for balance Standing balance-Leahy Scale: Fair Standing balance comment: able to complete clothing mgt during toileting with 1 UE while keeping other UE on RW for stability                           ADL either performed or assessed with clinical judgement   ADL                                         General ADL Comments: MOD A for clothing  mgt in standing, CGA-MIN A for toilet transfer to [facility] with RW, VC for PWBing    Extremity/Trunk Assessment              Vision       Perception     Praxis      Cognition Arousal/Alertness: Awake/alert Behavior During Therapy: WFL for tasks assessed/performed Overall Cognitive Status: Within Functional Limits for tasks assessed  Exercises      Shoulder Instructions       General Comments SpO2 95% on room air    Pertinent Vitals/ Pain       Pain Assessment Pain Assessment: 0-10 Faces Pain Scale: Hurts little more Pain Location: mid-back Pain Descriptors / Indicators: Aching Pain Intervention(s): Limited activity within patient's tolerance, Monitored during session, Premedicated before session, Repositioned  Home Living                                          Prior Functioning/Environment              Frequency  Min 2X/week        Progress Toward Goals  OT Goals(current goals can now be found in the care plan section)  Progress towards OT goals: Progressing toward  goals  Acute Rehab OT Goals Patient Stated Goal: to return to PLOF OT Goal Formulation: With patient/family Time For Goal Achievement: 02/19/23 Potential to Achieve Goals: Good  Plan Discharge plan remains appropriate;Frequency remains appropriate    Co-evaluation                 AM-PAC OT "6 Clicks" Daily Activity     Outcome Measure   Help from another person eating meals?: None Help from another person taking care of personal grooming?: None Help from another person toileting, which includes using toliet, bedpan, or urinal?: A Little Help from another person bathing (including washing, rinsing, drying)?: A Lot Help from another person to put on and taking off regular upper body clothing?: A Little Help from another person to put on and taking off regular lower body clothing?: A Lot 6 Click Score: 18    End of Session    OT Visit Diagnosis: Other abnormalities of gait and mobility (R26.89);Muscle weakness (generalized) (M62.81)   Activity Tolerance Patient tolerated treatment well   Patient Left with call bell/phone within reach;Other (comment) (seated on BSC with PT for further therapy)   Nurse Communication          Time: 4098-1191 OT Time Calculation (min): 14 min  Charges: OT General Charges $OT Visit: 1 Visit OT Treatments $Self Care/Home Management : 8-22 mins  Arman Filter., MPH, MS, OTR/L ascom [phone] 02/10/23, 2:14 PM

## 2023-02-10 NOTE — TOC Progression Note (Signed)
Transition of Care [facility]) - Progression Note    Patient Details  Name: Rachael Austin MRN: [id number] Date of Birth: 1941-08-31  [facility] CM/SW Contact  Truddie Hidden, RN Phone Number: 02/10/2023, 3:41 PM  Clinical Narrative:    Spoke with patient's daughter, Rachael Austin. Family has chosen [employer] advised [facility] Harvey approval would be needed and may take a couple of days to get a decision.  [employer] auth started.    Expected Discharge Plan: Skilled Nursing Facility Barriers to Discharge: No Barriers Identified  Expected Discharge Plan and Services   Discharge Planning Services: CM Consult   Living arrangements for the past 2 months: Single Family Home                                       Social Determinants of Health (SDOH) Interventions SDOH Screenings   Depression (PHQ2-9): Low Risk  (06/26/2022)  Tobacco Use: Medium Risk (02/04/2023)    Readmission Risk Interventions     No data to display

## 2023-02-10 NOTE — Progress Notes (Signed)
[facility] SURGICAL PROGRESS NOTE (cpt [phone])  Hospital Day(s): 9.   Post op day(s): 8 Days Post-Op.   Interval History: Patient seen and examined, no acute events or new complaints overnight. Patient reports she is feeling slightly better. Still distended but no abdominal pain, fever, chills, emesis. No new labs this morning. Currently on CLD; tolerating and wanting something more. Seen by SLP. Still with flatus; last BM was 24 hours ago. Working with therapies  Review of Systems:  Constitutional: denies fever, chills  HEENT: denies cough or congestion  Respiratory: denies any shortness of breath  Cardiovascular: denies chest pain or palpitations  Gastrointestinal: +distension (stable), denied abdominal pain, nausea, emesis Genitourinary: denies burning with urination or urinary frequency Musculoskeletal: denies pain, decreased motor or sensation  Vital signs in last 24 hours: [min-max] current  Temp:  [97.8 F (36.6 C)-99.2 F (37.3 C)] 98.7 F (37.1 C) (05/14 0730) Pulse Rate:  [68-90] 85 (05/14 0730) Resp:  [13-31] 17 (05/14 0730) BP: (117-170)/(46-110) 117/54 (05/14 0730) SpO2:  [93 %-99 %] 96 % (05/14 0730)     Height: 5\' 4"  (162.6 cm) Weight: 74.1 kg BMI (Calculated): 28.03   Intake/Output last 2 shifts:  05/13 0701 - 05/14 0700 In: 480 [P.O.:480] Out: 300 [Urine:300]   Physical Exam:  Constitutional: alert, cooperative and no distress  HENT: normocephalic without obvious abnormality Eyes: PERRL, EOM's grossly intact and symmetric  Respiratory: breathing non-labored at rest; on  Gastrointestinal: soft, no overt tenderness, does not appear distended but is certainly tympanic, no rebound/guarding    Labs:     Latest Ref Rng & Units 02/09/2023    5:20 AM 02/08/2023    5:17 AM 02/07/2023    5:37 AM  CBC  WBC 4.0 - 10.5 K/uL 12.5  16.4  15.0   Hemoglobin 12.0 - 15.0 g/dL 60.4  54.0  98.1   Hematocrit 36.0 - 46.0 % 30.3  30.9  30.2   Platelets 150  - 400 K/uL 288  269  235       Latest Ref Rng & Units 02/09/2023    5:20 AM 02/08/2023    5:17 AM 02/07/2023    5:37 AM  CMP  Glucose 70 - 99 mg/dL 191  478  295   BUN 8 - 23 mg/dL 12  11  14    Creatinine 0.44 - 1.00 mg/dL 6.21  3.08  6.57   Sodium 135 - 145 mmol/L 141  139  138   Potassium 3.5 - 5.1 mmol/L 3.5  3.3  3.3   Chloride 98 - 111 mmol/L 110  113  111   CO2 22 - 32 mmol/L 22  19  20    Calcium 8.9 - 10.3 mg/dL 8.1  8.0  7.9      Imaging studies:  No new pertinent imaging studies    Assessment/Plan: (ICD-10's: K56.7) 82 y.o. female with clinically resolving post-operative ileus 8 Days Post-Op s/p percutaneous fixation of left femoral neck fracture.   - Advance to FLD; Okay for hard candies/gum as well  - No need to replace NGT at this time - Monitor abdominal examination; on-going bowel function - No indication for surgical intervention - Serial KUBs as needed  - Pain control prn; limit narcotics as feasible  - Mobilize as tolerated; okay to work with therapies as feasible    - Further management per primary service; we will follow    All of the above findings and recommendations were discussed with the patient, patient's family at  bedside, and the medical team, and all of patient's and family's questions were answered to their expressed satisfaction.  -- Lynden Oxford, PA-C [facility] 02/10/2023, 7:42 AM M-F: 7am - 4pm

## 2023-02-10 NOTE — Care Management Important Message (Signed)
Important Message  Patient Details  Name: Rachael Austin MRN: [id number] Date of Birth: [birth year]   Medicare Important Message Given:  Yes     [name] 02/10/2023, 11:37 AM

## 2023-02-11 DIAGNOSIS — S72002A Fracture of unspecified part of neck of left femur, initial encounter for closed fracture: Secondary | ICD-10-CM | POA: Diagnosis not present

## 2023-02-11 DIAGNOSIS — E876 Hypokalemia: Secondary | ICD-10-CM | POA: Diagnosis not present

## 2023-02-11 DIAGNOSIS — I480 Paroxysmal atrial fibrillation: Secondary | ICD-10-CM | POA: Diagnosis not present

## 2023-02-11 LAB — CBC WITH DIFFERENTIAL/PLATELET
Abs Immature Granulocytes: 0.43 10*3/uL — ABNORMAL HIGH (ref 0.00–0.07)
Basophils Absolute: 0 10*3/uL (ref 0.0–0.1)
Basophils Relative: 0 %
Eosinophils Absolute: 0.2 10*3/uL (ref 0.0–0.5)
Eosinophils Relative: 2 %
HCT: 28.4 % — ABNORMAL LOW (ref 36.0–46.0)
Hemoglobin: 9.4 g/dL — ABNORMAL LOW (ref 12.0–15.0)
Immature Granulocytes: 4 %
Lymphocytes Relative: 16 %
Lymphs Abs: 1.9 10*3/uL (ref 0.7–4.0)
MCH: 29.3 pg (ref 26.0–34.0)
MCHC: 33.1 g/dL (ref 30.0–36.0)
MCV: 88.5 fL (ref 80.0–100.0)
Monocytes Absolute: 0.8 10*3/uL (ref 0.1–1.0)
Monocytes Relative: 7 %
Neutro Abs: 8.3 10*3/uL — ABNORMAL HIGH (ref 1.7–7.7)
Neutrophils Relative %: 71 %
Platelets: 257 10*3/uL (ref 150–400)
RBC: 3.21 MIL/uL — ABNORMAL LOW (ref 3.87–5.11)
RDW: 15.8 % — ABNORMAL HIGH (ref 11.5–15.5)
WBC: 11.6 10*3/uL — ABNORMAL HIGH (ref 4.0–10.5)
nRBC: 0 % (ref 0.0–0.2)

## 2023-02-11 LAB — BASIC METABOLIC PANEL
Anion gap: 6 (ref 5–15)
BUN: 12 mg/dL (ref 8–23)
CO2: 21 mmol/L — ABNORMAL LOW (ref 22–32)
Calcium: 8.1 mg/dL — ABNORMAL LOW (ref 8.9–10.3)
Chloride: 109 mmol/L (ref 98–111)
Creatinine, Ser: 0.69 mg/dL (ref 0.44–1.00)
GFR, Estimated: >60 mL/min (ref >60)
Glucose, Bld: 112 mg/dL — ABNORMAL HIGH (ref 70–99)
Potassium: 3.2 mmol/L — ABNORMAL LOW (ref 3.5–5.1)
Sodium: 136 mmol/L (ref 135–145)

## 2023-02-11 MED ORDER — CALCIUM CARBONATE ANTACID 500 MG PO CHEW
400.0000 mg | CHEWABLE_TABLET | Freq: Three times a day (TID) | ORAL | Status: DC
  Administered 2023-02-11 – 2023-02-12 (×5): 400 mg via ORAL
  Filled 2023-02-11 (×3): qty 2

## 2023-02-11 MED ORDER — POTASSIUM CHLORIDE CRYS ER 20 MEQ PO TBCR
40.0000 meq | EXTENDED_RELEASE_TABLET | Freq: Once | ORAL | Status: AC
  Administered 2023-02-11: 40 meq via ORAL
  Filled 2023-02-11: qty 4

## 2023-02-11 NOTE — TOC Progression Note (Signed)
Transition of Care [facility]) - Progression Note    Patient Details  Name: MEL MALCOM MRN: [id number] Date of Birth: 12/05/1940  Transition of Care [facility]) CM/SW Contact  Truddie Hidden, RN Phone Number: 02/11/2023, 1:04 PM  Clinical Narrative:    Per Sandrea Hughs Portal patient approved for [facility] 5/15-5/17 MD notified.  Called placed to [facility], [employer]. Left message regarding approval and if patient will be accepted today.  11:42pm Spoke with patient's daughter, [name]. She was advised Berkley Harvey was approved for SNF and RNCM was waiting on approval from facility for admission.   12:40pm Retrieved call from [facility]. Patient will be accepted tomorrow.      Expected Discharge Plan: Skilled Nursing Facility Barriers to Discharge: No Barriers Identified  Expected Discharge Plan and Services   Discharge Planning Services: CM Consult   Living arrangements for the past 2 months: Single Family Home                                       Social Determinants of Health (SDOH) Interventions SDOH Screenings   Depression (PHQ2-9): Low Risk  (06/26/2022)  Tobacco Use: Medium Risk (02/04/2023)    Readmission Risk Interventions     No data to display

## 2023-02-11 NOTE — Plan of Care (Signed)
  Problem: Health Behavior/Discharge Planning: Goal: Ability to manage health-related needs will improve Outcome: Progressing   Problem: Clinical Measurements: Goal: Respiratory complications will improve Outcome: Progressing   Problem: Clinical Measurements: Goal: Cardiovascular complication will be avoided Outcome: Progressing   Problem: Activity: Goal: Risk for activity intolerance will decrease Outcome: Progressing   Problem: Nutrition: Goal: Adequate nutrition will be maintained Outcome: Progressing   Problem: Pain Managment: Goal: General experience of comfort will improve Outcome: Progressing   Problem: Safety: Goal: Ability to remain free from injury will improve Outcome: Progressing

## 2023-02-11 NOTE — Progress Notes (Signed)
Physical Therapy Treatment Patient Details Name: Rachael Austin MRN: [id number] DOB: 07/13/1941 Today's Date: 02/11/2023   History of Present Illness Pt is an 83 y/o F admitted on 02/01/23 after presenting with c/o a fall. Pt found to have L impacted femoral neck hip fx & underwent percutaneous fixation on 02/02/23. PMH: HTN, interstitial cystitis, DDD, GERD, hypercholesterolemia. Found to have post-operative ileus    PT Comments    Pt is making good progress towards goals with ability to demonstrate improved functional independence with OOB mobility. Reports frequent BMs this date and also states she has been up to recliner. Left on [facility] with RN aware. Deferred further mobility attempts at this time. Will continue to progress.  Recommendations for follow up therapy are one component of a multi-disciplinary discharge planning process, led by the attending physician.  Recommendations may be updated based on patient status, additional functional criteria and insurance authorization.  Follow Up Recommendations  Can patient physically be transported by private vehicle: No    Assistance Recommended at Discharge Intermittent Supervision/Assistance  Patient can return home with the following Assist for transportation;Assistance with cooking/housework;Direct supervision/assist for financial management;Help with stairs or ramp for entrance;Direct supervision/assist for medications management;A little help with walking and/or transfers;A little help with bathing/dressing/bathroom   Equipment Recommendations   (TBD)    Recommendations for Other Services       Precautions / Restrictions Precautions Precautions: Fall Restrictions Weight Bearing Restrictions: Yes LLE Weight Bearing: Touchdown weight bearing LLE Partial Weight Bearing Percentage or Pounds: 25%     Mobility  Bed Mobility Overal bed mobility: Needs Assistance Bed Mobility: Supine to Sit     Supine to sit: Min guard      General bed mobility comments: needs slight assist with B LE. Once seated at EOB, upright posture noted    Transfers Overall transfer level: Needs assistance Equipment used: Rolling walker (2 wheels) Transfers: Bed to chair/wheelchair/BSC Sit to Stand: Min guard           General transfer comment: cues for sequencing and WBing restrictions. Able to step over to [facility].    Ambulation/Gait               General Gait Details: not performed this date   Stairs             Wheelchair Mobility    Modified Rankin (Stroke Patients Only)       Balance Overall balance assessment: Needs assistance Sitting-balance support: No upper extremity supported, Feet supported Sitting balance-Leahy Scale: Good     Standing balance support: Bilateral upper extremity supported, During functional activity, Single extremity supported, Reliant on assistive device for balance Standing balance-Leahy Scale: Fair                              Cognition Arousal/Alertness: Awake/alert Behavior During Therapy: WFL for tasks assessed/performed Overall Cognitive Status: Within Functional Limits for tasks assessed                                 General Comments: improved affect this date.Agreeable to session        Exercises Other Exercises Other Exercises: transfer to [facility] to void. FOllows commands well    General Comments        Pertinent Vitals/Pain Pain Assessment Pain Assessment: Faces Faces Pain Scale: Hurts a little bit Pain Location: L LE Pain Descriptors /  Indicators: Aching, Sore Pain Intervention(s): Limited activity within patient's tolerance, Repositioned    Home Living                          Prior Function            PT Goals (current goals can now be found in the care plan section) Acute Rehab PT Goals Patient Stated Goal: get moving better PT Goal Formulation: With patient Time For Goal Achievement:  02/18/23 Potential to Achieve Goals: Fair Progress towards PT goals: Progressing toward goals    Frequency    7X/week      PT Plan Current plan remains appropriate    Co-evaluation              AM-PAC PT "6 Clicks" Mobility   Outcome Measure  Help needed turning from your back to your side while in a flat bed without using bedrails?: A Little Help needed moving from lying on your back to sitting on the side of a flat bed without using bedrails?: A Little Help needed moving to and from a bed to a chair (including a wheelchair)?: A Little Help needed standing up from a chair using your arms (e.g., wheelchair or bedside chair)?: A Little Help needed to walk in hospital room?: A Lot Help needed climbing 3-5 steps with a railing? : A Lot 6 Click Score: 16    End of Session Equipment Utilized During Treatment: Gait belt Activity Tolerance: Patient tolerated treatment well;Patient limited by fatigue Patient left:  (left on [facility] with call bell and secure chat sent to RN) Nurse Communication: Mobility status;Weight bearing status PT Visit Diagnosis: Difficulty in walking, not elsewhere classified (R26.2);Other abnormalities of gait and mobility (R26.89);Muscle weakness (generalized) (M62.81)     Time: 1610-9604 PT Time Calculation (min) (ACUTE ONLY): 12 min  Charges:  $Therapeutic Activity: 8-22 mins                     Elizabeth Palau, PT, DPT, GCS [phone]    , 02/11/2023, 3:19 PM

## 2023-02-11 NOTE — Progress Notes (Signed)
PROGRESS NOTE    Rachael Austin  ZOX:[id number] DOB: 1941/08/14 DOA: 02/01/2023 PCP: Dale Teton, MD    Assessment & Plan:   Principal Problem:   Closed left hip fracture, initial encounter [facility]) Active Problems:   Hypercholesterolemia   Hypertensive urgency   Prolonged QT interval   Subcapital fracture of hip, left, closed, initial encounter (HCC)   PAF (paroxysmal atrial fibrillation) (HCC)   Ileus (HCC)   Ileus, postoperative (HCC)   Multifocal pneumonia   Acute respiratory failure with hypoxia (HCC)   Hypokalemia  Assessment and Plan:  Left hip fracture: S/p percutaneous fixation of left femoral neck hip fracture on 02/02/2023.  Follow-up with orthopedic surgeon.   Multifocal pneumonia: completed abx course. Encourage incentive spirometry    Acute hypoxic respiratory failure: resolved    Ileus: resolving    Chest pain/epigastric pain: Improved.  No acute PE on CT chest.  No plan for ischemic workup.   PAF: w/ RVR. Continue on coreg, eliquis. Echo showed EF estimated at 65 to 70%, mild LVH, grade 1 diastolic dysfunction, mild to moderate TR   Hypokalemia: potassium given    AKI: resolved    Hypertensive urgency: urgency resolved but still w/ HTN. Continue on coreg, hydralazine. D/c IV labetalol and IV Cardizem drip   Prolonged QTc interval: continue on tele      DVT prophylaxis: eliquis  Code Status: full  Family Communication: discussed pt's care w/ pt's family at bedside and answered their questions  Disposition Plan: d/c to SNF   Level of care: Progressive  Status is: Inpatient Remains inpatient appropriate because: d/c to SNF tomorrow    Consultants:  Ortho surg   Procedures:   Antimicrobials:   Subjective: Pt c/o fatigue   Objective: Vitals:   02/10/23 2013 02/11/23 0000 02/11/23 0438 02/11/23 0800  BP: (!) 150/78 (!) 155/61 (!) 156/50 (!) 155/61  Pulse: 72 74 88 86  Resp: 16 15 20 17   Temp: 98.2 F (36.8 C) 98 F (36.7 C)  98.3 F (36.8 C) 98.8 F (37.1 C)  TempSrc: Oral Oral Oral Oral  SpO2: 94% 93% 90% 94%  Weight:   74.6 kg   Height:        Intake/Output Summary (Last 24 hours) at 02/11/2023 0908 Last data filed at 02/11/2023 0440 Gross per 24 hour  Intake 478 ml  Output 601 ml  Net -123 ml   Filed Weights   02/05/23 0403 02/06/23 0503 02/11/23 0438  Weight: 71.1 kg 74.1 kg 74.6 kg    Examination:  General exam: Appears calm and comfortable  Respiratory system: Clear to auscultation. Respiratory effort normal. Cardiovascular system: S1/S2+. No rubs, gallops or clicks.  Gastrointestinal system: Abdomen is nondistended, soft and nontender.  Normal bowel sounds heard. Central nervous system: Alert and oriented. Moves all extremities  Psychiatry: Judgement and insight appear at baseline. Mood & affect appropriate.     Data Reviewed: I have personally reviewed following labs and imaging studies  CBC: Recent Labs  Lab 02/06/23 0455 02/07/23 0537 02/08/23 0517 02/09/23 0520 02/11/23 0622  WBC 13.9* 15.0* 16.4* 12.5* 11.6*  NEUTROABS 10.6* 10.4* 11.6*  --  8.3*  HGB 10.3* 10.1* 10.2* 10.0* 9.4*  HCT 30.8* 30.2* 30.9* 30.3* 28.4*  MCV 86.3 86.8 87.8 88.1 88.5  PLT 231 235 269 288 257   Basic Metabolic Panel: Recent Labs  Lab 02/05/23 0320 02/06/23 0455 02/07/23 0537 02/08/23 0517 02/09/23 0520 02/11/23 0622  NA 134* 134* 138 139 141 136  K 3.5  3.2* 3.3* 3.3* 3.5 3.2*  CL 104 105 111 113* 110 109  CO2 22 21* 20* 19* 22 21*  GLUCOSE 120* 110* 113* 125* 116* 112*  BUN 15 12 14 11 12 12   CREATININE 0.65 0.64 0.70 0.60 0.66 0.69  CALCIUM 7.7* 7.7* 7.9* 8.0* 8.1* 8.1*  MG 2.0 2.1 2.2 2.1 2.0  --   PHOS  --   --  2.2* 2.2* 2.7  --    GFR: Estimated Creatinine Clearance: 52.7 mL/min (by C-G formula based on SCr of 0.69 mg/dL). Liver Function Tests: No results for input(s): "AST", "ALT", "ALKPHOS", "BILITOT", "PROT", "ALBUMIN" in the last 168 hours. No results for input(s):  "LIPASE", "AMYLASE" in the last 168 hours. No results for input(s): "AMMONIA" in the last 168 hours. Coagulation Profile: No results for input(s): "INR", "PROTIME" in the last 168 hours. Cardiac Enzymes: No results for input(s): "CKTOTAL", "CKMB", "CKMBINDEX", "TROPONINI" in the last 168 hours. BNP (last 3 results) No results for input(s): "PROBNP" in the last 8760 hours. HbA1C: No results for input(s): "HGBA1C" in the last 72 hours. CBG: No results for input(s): "GLUCAP" in the last 168 hours. Lipid Profile: No results for input(s): "CHOL", "HDL", "LDLCALC", "TRIG", "CHOLHDL", "LDLDIRECT" in the last 72 hours. Thyroid Function Tests: No results for input(s): "TSH", "T4TOTAL", "FREET4", "T3FREE", "THYROIDAB" in the last 72 hours. Anemia Panel: No results for input(s): "VITAMINB12", "FOLATE", "FERRITIN", "TIBC", "IRON", "RETICCTPCT" in the last 72 hours. Sepsis Labs: No results for input(s): "PROCALCITON", "LATICACIDVEN" in the last 168 hours.  No results found for this or any previous visit (from the past 240 hour(s)).       Radiology Studies: No results found.      Scheduled Meds:  apixaban  5 mg Oral BID   carvedilol  25 mg Oral BID WC   docusate sodium  100 mg Oral BID   feeding supplement  1 Container Oral TID BM   hydrALAZINE  25 mg Oral BID   multivitamin with minerals  1 tablet Oral Daily   pravastatin  20 mg Oral q1800   senna  1 tablet Oral BID   Continuous Infusions:  sodium chloride Stopped (02/08/23 0255)   methocarbamol (ROBAXIN) IV Stopped (02/08/23 0030)     LOS: 10 days    Time spent: 25 mins     Charise Killian, MD [facility] Pager [phone]  If 7PM-7AM, please contact night-coverage [email or web address] 02/11/2023, 9:08 AM

## 2023-02-11 NOTE — Progress Notes (Signed)
Subjective:  POD #9 s/p percutaneous fixation of left femoral neck hip fracture. Patient reports left hip pain as mild.  Daughter is at the bedside.  Patient has had 4 BMs today.  Patient is making progress with PT/OT.  Case mgt note states patient has been accepted at [facility].   Objective:   VITALS:   Vitals:   02/11/23 0000 02/11/23 0438 02/11/23 0800 02/11/23 1105  BP: (!) 155/61 (!) 156/50 (!) 155/61 (!) 119/46  Pulse: 74 88 86 75  Resp: 15 20 17  (!) 25  Temp: 98 F (36.7 C) 98.3 F (36.8 C) 98.8 F (37.1 C)   TempSrc: Oral Oral Oral   SpO2: 93% 90% 94% 98%  Weight:  74.6 kg    Height:        PHYSICAL EXAM: Left lower extremity Neurovascular intact Sensation intact distally Intact pulses distally Dorsiflexion/Plantar flexion intact Incision: dressing C/D/I No cellulitis present Compartment soft + ecchymosis posterior thigh. No erythema seen.  No drainage from incision.  Thigh is soft and compressible.  LABS  Results for orders placed or performed during the hospital encounter of 02/01/23 (from the past 24 hour(s))  CBC with Differential/Platelet     Status: Abnormal   Collection Time: 02/11/23  6:22 AM  Result Value Ref Range   WBC 11.6 (H) 4.0 - 10.5 K/uL   RBC 3.21 (L) 3.87 - 5.11 MIL/uL   Hemoglobin 9.4 (L) 12.0 - 15.0 g/dL   HCT 40.9 (L) 81.1 - 91.4 %   MCV 88.5 80.0 - 100.0 fL   MCH 29.3 26.0 - 34.0 pg   MCHC 33.1 30.0 - 36.0 g/dL   RDW 78.2 (H) 95.6 - 21.3 %   Platelets 257 150 - 400 K/uL   nRBC 0.0 0.0 - 0.2 %   Neutrophils Relative % 71 %   Neutro Abs 8.3 (H) 1.7 - 7.7 K/uL   Lymphocytes Relative 16 %   Lymphs Abs 1.9 0.7 - 4.0 K/uL   Monocytes Relative 7 %   Monocytes Absolute 0.8 0.1 - 1.0 K/uL   Eosinophils Relative 2 %   Eosinophils Absolute 0.2 0.0 - 0.5 K/uL   Basophils Relative 0 %   Basophils Absolute 0.0 0.0 - 0.1 K/uL   Immature Granulocytes 4 %   Abs Immature Granulocytes 0.43 (H) 0.00 - 0.07 K/uL  Basic metabolic panel      Status: Abnormal   Collection Time: 02/11/23  6:22 AM  Result Value Ref Range   Sodium 136 135 - 145 mmol/L   Potassium 3.2 (L) 3.5 - 5.1 mmol/L   Chloride 109 98 - 111 mmol/L   CO2 21 (L) 22 - 32 mmol/L   Glucose, Bld 112 (H) 70 - 99 mg/dL   BUN 12 8 - 23 mg/dL   Creatinine, Ser 0.86 0.44 - 1.00 mg/dL   Calcium 8.1 (L) 8.9 - 10.3 mg/dL   GFR, Estimated >57 >84 mL/min   Anion gap 6 5 - 15    No results found.  Assessment/Plan: 9 Days Post-Op   Principal Problem:   Closed left hip fracture, initial encounter (HCC) Active Problems:   Hypercholesterolemia   Hypertensive urgency   Prolonged QT interval   Subcapital fracture of hip, left, closed, initial encounter (HCC)   PAF (paroxysmal atrial fibrillation) (HCC)   Ileus (HCC)   Ileus, postoperative (HCC)   Multifocal pneumonia   Acute respiratory failure with hypoxia (HCC)   Hypokalemia  Patient progressing from orthopaedic standpoint.  Continue PT/OT.  Will defer post discharge anticoagulation to medical team.  Patient should be on anticoagulation for 4 more weeks to prevent post-op DVT.  Continue 25% partial weightbearing on the left lower extremity for 6 weeks post-op.  I will see the patient back in the office for staple removal and xrays in 10-14 days.    Juanell Fairly , MD 02/11/2023, 2:14 PM

## 2023-02-12 ENCOUNTER — Ambulatory Visit: Payer: Medicare PPO | Admitting: Dermatology

## 2023-02-12 DIAGNOSIS — I480 Paroxysmal atrial fibrillation: Secondary | ICD-10-CM | POA: Diagnosis not present

## 2023-02-12 DIAGNOSIS — K219 Gastro-esophageal reflux disease without esophagitis: Secondary | ICD-10-CM | POA: Diagnosis not present

## 2023-02-12 DIAGNOSIS — S72002D Fracture of unspecified part of neck of left femur, subsequent encounter for closed fracture with routine healing: Secondary | ICD-10-CM | POA: Diagnosis not present

## 2023-02-12 DIAGNOSIS — J189 Pneumonia, unspecified organism: Secondary | ICD-10-CM | POA: Diagnosis not present

## 2023-02-12 DIAGNOSIS — I10 Essential (primary) hypertension: Secondary | ICD-10-CM

## 2023-02-12 DIAGNOSIS — Z7401 Bed confinement status: Secondary | ICD-10-CM | POA: Diagnosis not present

## 2023-02-12 DIAGNOSIS — S72002A Fracture of unspecified part of neck of left femur, initial encounter for closed fracture: Secondary | ICD-10-CM | POA: Diagnosis not present

## 2023-02-12 DIAGNOSIS — N3010 Interstitial cystitis (chronic) without hematuria: Secondary | ICD-10-CM | POA: Diagnosis not present

## 2023-02-12 DIAGNOSIS — W19XXXD Unspecified fall, subsequent encounter: Secondary | ICD-10-CM | POA: Diagnosis not present

## 2023-02-12 DIAGNOSIS — I2510 Atherosclerotic heart disease of native coronary artery without angina pectoris: Secondary | ICD-10-CM | POA: Diagnosis not present

## 2023-02-12 DIAGNOSIS — K5900 Constipation, unspecified: Secondary | ICD-10-CM | POA: Diagnosis not present

## 2023-02-12 DIAGNOSIS — W010XXA Fall on same level from slipping, tripping and stumbling without subsequent striking against object, initial encounter: Secondary | ICD-10-CM | POA: Diagnosis not present

## 2023-02-12 DIAGNOSIS — Z7901 Long term (current) use of anticoagulants: Secondary | ICD-10-CM | POA: Diagnosis not present

## 2023-02-12 DIAGNOSIS — W19XXXA Unspecified fall, initial encounter: Secondary | ICD-10-CM | POA: Diagnosis not present

## 2023-02-12 DIAGNOSIS — R519 Headache, unspecified: Secondary | ICD-10-CM | POA: Diagnosis not present

## 2023-02-12 DIAGNOSIS — Z4889 Encounter for other specified surgical aftercare: Secondary | ICD-10-CM | POA: Diagnosis not present

## 2023-02-12 DIAGNOSIS — E7800 Pure hypercholesterolemia, unspecified: Secondary | ICD-10-CM | POA: Diagnosis not present

## 2023-02-12 DIAGNOSIS — E876 Hypokalemia: Secondary | ICD-10-CM | POA: Diagnosis not present

## 2023-02-12 LAB — BASIC METABOLIC PANEL
Anion gap: 6 (ref 5–15)
BUN: 9 mg/dL (ref 8–23)
CO2: 23 mmol/L (ref 22–32)
Calcium: 7.9 mg/dL — ABNORMAL LOW (ref 8.9–10.3)
Chloride: 107 mmol/L (ref 98–111)
Creatinine, Ser: 0.68 mg/dL (ref 0.44–1.00)
GFR, Estimated: >60 mL/min (ref >60)
Glucose, Bld: 109 mg/dL — ABNORMAL HIGH (ref 70–99)
Potassium: 3.2 mmol/L — ABNORMAL LOW (ref 3.5–5.1)
Sodium: 136 mmol/L (ref 135–145)

## 2023-02-12 LAB — CBC
HCT: 27.7 % — ABNORMAL LOW (ref 36.0–46.0)
Hemoglobin: 9.1 g/dL — ABNORMAL LOW (ref 12.0–15.0)
MCH: 29 pg (ref 26.0–34.0)
MCHC: 32.9 g/dL (ref 30.0–36.0)
MCV: 88.2 fL (ref 80.0–100.0)
Platelets: 247 10*3/uL (ref 150–400)
RBC: 3.14 MIL/uL — ABNORMAL LOW (ref 3.87–5.11)
RDW: 15.5 % (ref 11.5–15.5)
WBC: 8.6 10*3/uL (ref 4.0–10.5)
nRBC: 0 % (ref 0.0–0.2)

## 2023-02-12 MED ORDER — POTASSIUM CHLORIDE CRYS ER 20 MEQ PO TBCR
40.0000 meq | EXTENDED_RELEASE_TABLET | Freq: Once | ORAL | Status: AC
  Administered 2023-02-12: 40 meq via ORAL
  Filled 2023-02-12: qty 2

## 2023-02-12 MED ORDER — CARVEDILOL 25 MG PO TABS: 25.0000 mg | ORAL_TABLET | Freq: Two times a day (BID) | ORAL | 0 refills | Status: DC

## 2023-02-12 MED ORDER — HYDROCODONE-ACETAMINOPHEN 5-325 MG PO TABS: 1.0000 | ORAL_TABLET | ORAL | 0 refills | Status: AC | PRN

## 2023-02-12 MED ORDER — APIXABAN 5 MG PO TABS: 5.0000 mg | ORAL_TABLET | Freq: Two times a day (BID) | ORAL | 0 refills | Status: DC

## 2023-02-12 NOTE — TOC Transition Note (Signed)
Transition of Care [facility]) - Progression Note    Patient Details  Name: Rachael Austin MRN: [id number] Date of Birth: 1941/01/22  Transition [facility]) CM/SW Contact  Truddie Hidden, RN Phone Number: 02/12/2023, 10:53 AM  Clinical Narrative:    Spoke with Tiffany in admissions. Per facility patient admission confirmed for today. Patient assigned room # 504 Nurse will call report to  [phone] Face sheet and medical necessity forms printed to the floor to be added to the EMS pack EMS arranged  Discharge summary and SNF tranfer report sent in HUB.  TOC signing off.      Expected Discharge Plan: Skilled Nursing Facility Barriers to Discharge: No Barriers Identified  Expected Discharge Plan and Services   Discharge Planning Services: CM Consult   Living arrangements for the past 2 months: Single Family Home                                       Social Determinants of Health (SDOH) Interventions SDOH Screenings   Food Insecurity: No Food Insecurity (02/11/2023)  Housing: Low Risk  (02/11/2023)  Transportation Needs: No Transportation Needs (02/11/2023)  Utilities: Not At Risk (02/11/2023)  Depression (PHQ2-9): Low Risk  (06/26/2022)  Tobacco Use: Medium Risk (02/04/2023)    Readmission Risk Interventions     No data to display

## 2023-02-12 NOTE — Discharge Summary (Addendum)
Physician Discharge Summary  Rachael Austin:[id number] DOB: 11-Nov-1940 DOA: 02/01/2023  PCP: Dale Jay, MD  Admit date: 02/01/2023 Discharge date: 02/12/2023  Admitted From: home  Disposition:  SNF  Recommendations for Outpatient Follow-up:  Follow up with PCP in 1-2 weeks F/u w/ ortho surg, Dr. Martha Clan, in 10-14 days for staple removal & xrays F/u w/ cardio, Dr. Mariah Milling, in 1-2 weeks  Home Health: no Equipment/Devices:  Discharge Condition: stable  CODE STATUS: full  Diet recommendation: Heart Healthy  Brief/Interim Summary: HPI was taken from Dr. Antionette Char: Rachael Austin is a 82 y.o. female with medical history significant for hypertension, hyperlipidemia, and interstitial cystitis patient presents emergency department with severe left groin pain and headache after mechanical fall at home.   Patient was in her usual state and having an uneventful day when she was walking through with doorway and lost her footing while going down a small step.  She fell onto her left side and also hit her head.  There was no loss of consciousness.   She is typically active, does not experience angina with activity, and denies any known history of cardiac disease.  She can go up a flight of stairs without cardiopulmonary symptoms.   ED Course: Upon arrival to the ED, patient is found to be hypertensive with systolic pressure in the 200 range.  CT of the head and cervical spine are negative for acute findings.  Chest x-ray also negative for acute disease.  Plain radiographs of the left hip demonstrate subcapital left femur fracture.  BMP and CBC are unremarkable.  EKG demonstrates sinus rhythm with incomplete RBBB and QTc 501 ms.   Orthopedic surgery (Dr. Martha Clan) was consulted by the ED physician and the patient was treated with IV fluids, IV hydralazine, and morphine.   As per Dr. Mayford Knife 5/15-5/16/24: Pt was medically stable for d/c but was waiting on insurance auth & bed for pt. Pt  was d/c to SNF at [facility]. For more information, please see previous progress/consult notes.    Discharge Diagnoses:  Principal Problem:   Closed left hip fracture, initial encounter [facility]) Active Problems:   Hypercholesterolemia   Hypertensive urgency   Prolonged QT interval   Subcapital fracture of hip, left, closed, initial encounter (HCC)   PAF (paroxysmal atrial fibrillation) (HCC)   Ileus (HCC)   Ileus, postoperative (HCC)   Multifocal pneumonia   Acute respiratory failure with hypoxia (HCC)   Hypokalemia  Left hip fracture: S/p percutaneous fixation of left femoral neck hip fracture on 02/02/2023.  F/u w/ ortho surg in 10-14 days. Pt is already on anticoagulation, eliquis, for PAF so it will serve as DVT prophylaxis as well.    Multifocal pneumonia: completed abx course. Encourage incentive spirometry    Acute hypoxic respiratory failure: resolved    Ileus: resolved   Chest pain/epigastric pain: Improved.  No acute PE on CT chest.  No plan for ischemic workup.   PAF: w/ RVR. Continue on coreg, eliquis. Echo showed EF estimated at 65 to 70%, mild LVH, grade 1 diastolic dysfunction, mild to moderate TR   Hypokalemia: KCl repleated   AKI: resolved    Hypertensive urgency: urgency resolved but still w/ HTN. Continue on coreg, hydralazine. D/c IV labetalol and IV Cardizem drip   Prolonged QTc interval: continue on tele    Discharge Instructions  Discharge Instructions     Diet - low sodium heart healthy   Complete by: As directed    Discharge instructions   Complete  by: As directed    F/u w/ ortho surg, Dr. Martha Clan, in 10-14 days for staple removal & xrays. Continue 25% partial weightbearing on the left lower extremity for 6 weeks post-op. F/u w/ PCP in 1-2 weeks. F/u w/ cardio, Dr. Mariah Milling, in 1-2 weeks.   Increase activity slowly   Complete by: As directed       Allergies as of 02/12/2023       Reactions   Augmentin [amoxicillin-pot Clavulanate]  Swelling, Rash   Swelling of the lips   Lexapro [escitalopram Oxalate]    Micardis [telmisartan] Itching   Myrbetriq [mirabegron] Itching   Niacin And Related Itching   Codeine Sulfate Other (See Comments)   "Makes her hyper"   Vesicare [solifenacin Succinate] Nausea And Vomiting, Rash        Medication List     STOP taking these medications    imipramine 10 MG tablet Commonly known as: TOFRANIL   nystatin cream Commonly known as: MYCOSTATIN   silver sulfADIAZINE 1 % cream Commonly known as: SILVADENE   triamcinolone ointment 0.1 % Commonly known as: KENALOG       TAKE these medications    acetaminophen 325 MG tablet Commonly known as: TYLENOL Take 650 mg by mouth as needed.   apixaban 5 MG Tabs tablet Commonly known as: ELIQUIS Take 1 tablet (5 mg total) by mouth 2 (two) times daily.   carvedilol 25 MG tablet Commonly known as: COREG Take 1 tablet (25 mg total) by mouth 2 (two) times daily with a meal.   esomeprazole 40 MG capsule Commonly known as: NexIUM Take 1 capsule (40 mg total) by mouth daily.   hydrALAZINE 25 MG tablet Commonly known as: APRESOLINE Take 1 tablet (25 mg total) by mouth 2 (two) times daily.   HYDROcodone-acetaminophen 5-325 MG tablet Commonly known as: NORCO/VICODIN Take 1 tablet by mouth every 4 (four) hours as needed for up to 1 day for moderate pain or severe pain.   lovastatin 20 MG tablet Commonly known as: MEVACOR Take 1 tablet (20 mg total) by mouth at bedtime.   sertraline 50 MG tablet Commonly known as: ZOLOFT Take 1 tablet by mouth once daily   trospium 20 MG tablet Commonly known as: SANCTURA Take 1 tablet (20 mg total) by mouth daily.        Contact information for follow-up providers     Juanell Fairly, MD Follow up.   Specialty: Orthopedic Surgery Why: F/u in 10-14 days Contact information: [address] [place] [address] [phone]         Antonieta Iba, MD Follow up.    Specialty: Cardiology Why: F/u in 1-2 weeks Contact information: [address] [place] [address] [phone]              Contact information for after-discharge care     Destination     [facility] .   Service: Skilled Nursing Contact information: [address] [place] [address] [phone]                    Allergies  Allergen Reactions   Augmentin [Amoxicillin-Pot Clavulanate] Swelling and Rash    Swelling of the lips   Lexapro [Escitalopram Oxalate]    Micardis [Telmisartan] Itching   Myrbetriq [Mirabegron] Itching   Niacin And Related Itching   Codeine Sulfate Other (See Comments)    "Makes her hyper"  Vesicare [Solifenacin Succinate] Nausea And Vomiting and Rash    Consultations: Cardio Ortho surg    Procedures/Studies: DG ABD ACUTE 2+V W 1V CHEST  Result Date: 02/07/2023 CLINICAL DATA:  Ileus EXAM: DG ABDOMEN ACUTE WITH 1 VIEW CHEST COMPARISON:  02/06/2023 FINDINGS: NG tube is in the stomach. Heart and mediastinal contours are within normal limits. Extensive bilateral airspace opacities concerning for multifocal pneumonia, stable. No visible effusions. Dilated small bowel loops and right colon again noted. Favor ileus. No real change since prior study. Prior cholecystectomy. No free air or organomegaly peer IMPRESSION: Continued dilated large and small bowel, favor ileus. No real change. Continued patchy bilateral airspace disease concerning for multifocal pneumonia. No change. Electronically Signed   By: Charlett Nose M.D.   On: 02/07/2023 07:31   DG ABD ACUTE 2+V W 1V CHEST  Result Date: 02/06/2023 CLINICAL DATA:  Ileus following hip surgery EXAM: DG ABDOMEN ACUTE WITH 1 VIEW CHEST COMPARISON:  02/05/2023 FINDINGS: Nasogastric tube extends into stomach. Enlargement of cardiac silhouette. Patchy BILATERAL pulmonary  infiltrates favor multifocal pneumonia. No pleural effusion or pneumothorax. Air-filled bowel loops throughout abdomen consistent with a combination of nondistended colon a few mildly distended small bowel loops. Stool present in rectum. No bowel wall thickening or free air. Osseous demineralization with prior pinning of LEFT hip. IMPRESSION: Few dilated air-filled small bowel loops favoring postop ileus. Increased patchy BILATERAL pulmonary infiltrates favoring multifocal pneumonia. Electronically Signed   By: Ulyses Southward M.D.   On: 02/06/2023 08:50   DG ABD ACUTE 2+V W 1V CHEST  Result Date: 02/05/2023 CLINICAL DATA:  Ileus. EXAM: DG ABDOMEN ACUTE WITH 1 VIEW CHEST COMPARISON:  Chest/abdominal radiographs 02/04/2023 FINDINGS: The cardiomediastinal silhouette is unchanged. Interstitial and patchy airspace opacities bilaterally, greatest in the perihilar regions, have mildly worsened. No sizable pleural effusion or pneumothorax is identified. An enteric tube has been placed and terminates in the expected region of the gastric body with side port also overlying the stomach. No intraperitoneal free air is identified. Mild dilatation of multiple gas-filled loops of small bowel has slightly improved. Gas and stool are present in nondilated colon. Fixation screws are noted in the proximal left femur. IMPRESSION: 1. Slightly improved small bowel dilatation which may reflect ileus or partial obstruction. 2. Mildly worsened bilateral lung opacities concerning for pneumonia. Electronically Signed   By: Sebastian Ache M.D.   On: 02/05/2023 08:50   DG ABD ACUTE 2+V W 1V CHEST  Result Date: 02/04/2023 CLINICAL DATA:  Gastric outlet obstruction. EXAM: DG ABDOMEN ACUTE WITH 1 VIEW CHEST COMPARISON:  Feb 01, 2023. FINDINGS: Mildly dilated small bowel loops are noted concerning for distal small bowel obstruction or ileus. Residual contrast is noted in urinary bladder. No radiopaque calculi or other significant radiographic  abnormality is seen. Heart size and mediastinal contours are within normal limits. Mild bilateral perihilar opacities are noted concerning for pulmonary edema or inflammation, right greater than left. IMPRESSION: Dilated small bowel loops are noted concerning for distal small bowel obstruction or ileus. Bilateral lung opacities are noted, right greater than left, concerning for pulmonary edema or inflammation. Electronically Signed   By: Lupita Raider M.D.   On: 02/04/2023 12:55   ECHOCARDIOGRAM COMPLETE  Result Date: 02/04/2023    ECHOCARDIOGRAM REPORT   Patient Name:   SARAIAH BRAYE [facility] Date of Exam: 02/03/2023 Medical Rec #:  [id number]          Height:       64.0 in Accession #:  [id number]         Weight:       142.0 lb Date of Birth:  1941-09-13          BSA:          1.691 m Patient Age:    25 years           BP:           159/63 mmHg Patient Gender: F                  HR:           115 bpm. Exam Location:  [facility] Procedure: 2D Echo, Cardiac Doppler and Color Doppler Indications:     R07.9 Chest pain  History:         Patient has prior history of Echocardiogram examinations, most                  recent 11/21/2021.  Sonographer:     Daphine Deutscher RDCS Referring Phys:  [id number] [address] Diagnosing Phys: Yvonne Kendall MD IMPRESSIONS  1. Left ventricular ejection fraction, by estimation, is 65 to 70%. The left ventricle has normal function. The left ventricle has no regional wall motion abnormalities. There is mild left ventricular hypertrophy. Left ventricular diastolic parameters are consistent with Grade I diastolic dysfunction (impaired relaxation). Elevated left atrial pressure.  2. Right ventricular systolic function is mildly reduced. The right ventricular size is normal. There is normal pulmonary artery systolic pressure.  3. The mitral valve is normal in structure. Trivial mitral valve regurgitation. No evidence of mitral stenosis.  4. Tricuspid valve regurgitation is mild to moderate.   5. The aortic valve has an indeterminant number of cusps. There is mild calcification of the aortic valve. There is mild thickening of the aortic valve. Aortic valve regurgitation is not visualized. Aortic valve sclerosis/calcification is present, without any evidence of aortic stenosis.  6. The inferior vena cava is normal in size with <50% respiratory variability, suggesting right atrial pressure of 8 mmHg. FINDINGS  Left Ventricle: Left ventricular ejection fraction, by estimation, is 65 to 70%. The left ventricle has normal function. The left ventricle has no regional wall motion abnormalities. The left ventricular internal cavity size was normal in size. There is  mild left ventricular hypertrophy. Left ventricular diastolic parameters are consistent with Grade I diastolic dysfunction (impaired relaxation). Elevated left atrial pressure. Right Ventricle: The right ventricular size is normal. No increase in right ventricular wall thickness. Right ventricular systolic function is mildly reduced. There is normal pulmonary artery systolic pressure. The tricuspid regurgitant velocity is 2.50 m/s, and with an assumed right atrial pressure of 8 mmHg, the estimated right ventricular systolic pressure is 33.0 mmHg. Left Atrium: Left atrial size was normal in size. Right Atrium: Right atrial size was normal in size. Pericardium: There is no evidence of pericardial effusion. Mitral Valve: The mitral valve is normal in structure. Trivial mitral valve regurgitation. No evidence of mitral valve stenosis. Tricuspid Valve: The tricuspid valve is normal in structure. Tricuspid valve regurgitation is mild to moderate. Aortic Valve: The aortic valve has an indeterminant number of cusps. There is mild calcification of the aortic valve. There is mild thickening of the aortic valve. Aortic valve regurgitation is not visualized. Aortic valve sclerosis/calcification is present, without any evidence of aortic stenosis. Aortic valve  mean gradient measures 9.0 mmHg. Aortic valve peak gradient measures 11.4 mmHg. Aortic valve area, by VTI measures 2.05 cm. Pulmonic Valve: The pulmonic  valve was normal in structure. Pulmonic valve regurgitation is trivial. No evidence of pulmonic stenosis. Aorta: The aortic root is normal in size and structure. Pulmonary Artery: The pulmonary artery is of normal size. Venous: The inferior vena cava is normal in size with less than 50% respiratory variability, suggesting right atrial pressure of 8 mmHg. IAS/Shunts: The interatrial septum was not well visualized.  LEFT VENTRICLE PLAX 2D LVIDd:         3.40 cm   Diastology LVIDs:         2.30 cm   LV e' medial:    6.20 cm/s LV PW:         1.20 cm   LV E/e' medial:  17.3 LV IVS:        1.20 cm   LV e' lateral:   6.05 cm/s LVOT diam:     1.90 cm   LV E/e' lateral: 17.7 LV SV:         53 LV SV Index:   31 LVOT Area:     2.84 cm  RIGHT VENTRICLE             IVC RV Basal diam:  3.30 cm     IVC diam: 1.60 cm RV S prime:     10.22 cm/s LEFT ATRIUM             Index        RIGHT ATRIUM           Index LA diam:        4.20 cm 2.48 cm/m   RA Area:     10.90 cm LA Vol (A2C):   41.3 ml 24.42 ml/m  RA Volume:   25.70 ml  15.19 ml/m LA Vol (A4C):   36.5 ml 21.58 ml/m LA Biplane Vol: 39.2 ml 23.18 ml/m  AORTIC VALVE AV Area (Vmax):    1.83 cm AV Area (Vmean):   1.80 cm AV Area (VTI):     2.05 cm AV Vmax:           168.78 cm/s AV Vmean:          118.474 cm/s AV VTI:            0.259 m AV Peak Grad:      11.4 mmHg AV Mean Grad:      9.0 mmHg LVOT Vmax:         108.75 cm/s LVOT Vmean:        75.350 cm/s LVOT VTI:          0.188 m LVOT/AV VTI ratio: 0.72  AORTA Ao Root diam: 3.30 cm Ao Asc diam:  2.90 cm MITRAL VALVE                TRICUSPID VALVE MV Area (PHT): 2.93 cm     TR Peak grad:   25.0 mmHg MV Decel Time: 259 msec     TR Vmax:        250.00 cm/s MV E velocity: 107.14 cm/s MV A velocity: 144.50 cm/s  SHUNTS MV E/A ratio:  0.74         Systemic VTI:  0.19 m                              Systemic Diam: 1.90 cm Yvonne Kendall MD Electronically signed by Yvonne Kendall MD Signature Date/Time: 02/04/2023/7:35:40 AM    Final    CT Angio Chest Pulmonary Embolism (PE)  W or WO Contrast  Result Date: 02/03/2023 CLINICAL DATA:  High probability of pulmonary embolus. EXAM: CT ANGIOGRAPHY CHEST WITH CONTRAST TECHNIQUE: Multidetector CT imaging of the chest was performed using the standard protocol during bolus administration of intravenous contrast. Multiplanar CT image reconstructions and MIPs were obtained to evaluate the vascular anatomy. RADIATION DOSE REDUCTION: This exam was performed according to the departmental dose-optimization program which includes automated exposure control, adjustment of the mA and/or kV according to patient size and/or use of iterative reconstruction technique. CONTRAST:  60mL OMNIPAQUE IOHEXOL 350 MG/ML SOLN COMPARISON:  None Available. FINDINGS: Cardiovascular: Satisfactory opacification of the pulmonary arteries to the segmental level. No evidence of pulmonary embolism. Normal heart size. No pericardial effusion. Coronary artery calcifications are noted. Mediastinum/Nodes: Thyroid gland is unremarkable. No adenopathy is noted. Severely dilated and fluid-filled esophagus is noted. Lungs/Pleura: No pneumothorax or pleural effusion is noted. Patchy airspace opacities are noted throughout the upper and lower lobes bilaterally most consistent with multifocal pneumonia. Upper Abdomen: Moderate to severe gastric distention is noted. Musculoskeletal: No chest wall abnormality. No acute or significant osseous findings. Review of the MIP images confirms the above findings. IMPRESSION: No definite evidence of pulmonary embolus. Patchy airspace opacities are noted throughout both lungs most consistent with multifocal pneumonia. Severely dilated and fluid-filled esophagus is noted as well as moderate to severe gastric distention seen in visualized portion of upper  abdomen. This is concerning for gastric outlet obstruction. Coronary artery calcifications are noted suggesting coronary artery disease. Aortic Atherosclerosis (ICD10-I70.0). Electronically Signed   By: Lupita Raider M.D.   On: 02/03/2023 19:03   DG HIP UNILAT WITH PELVIS 2-3 VIEWS LEFT  Result Date: 02/02/2023 CLINICAL DATA:  Postop. EXAM: DG HIP (WITH OR WITHOUT PELVIS) 2-3V LEFT COMPARISON:  Preoperative radiograph yesterday FINDINGS: Four screws traverse left femoral neck fracture. Unchanged fracture alignment from preoperative imaging. Recent postsurgical change includes air and edema in the soft tissues. Lateral skin staples in place. IMPRESSION: ORIF left femoral neck fracture. No immediate postoperative complication. Electronically Signed   By: Narda Rutherford M.D.   On: 02/02/2023 15:52   DG HIP UNILAT WITH PELVIS 2-3 VIEWS LEFT  Result Date: 02/02/2023 CLINICAL DATA:  [id number] Surgery, elective [id number] EXAM: DG HIP (WITH OR WITHOUT PELVIS) 2-3V LEFT COMPARISON:  [id number] FINDINGS: Intraoperative images during pin fixation of the left femoral neck for a subcapital femoral neck fracture. Stable alignment. No evidence of immediate hardware complication. IMPRESSION: Intraoperative images during left femoral neck fixation. No evidence of immediate hardware complication. Electronically Signed   By: Caprice Renshaw M.D.   On: 02/02/2023 15:06   DG C-Arm 1-60 Min-No Report  Result Date: 02/02/2023 Fluoroscopy was utilized by the requesting physician.  No radiographic interpretation.   DG Chest 1 View  Result Date: 02/01/2023 CLINICAL DATA:  Left hip pain after fall. EXAM: CHEST  1 VIEW COMPARISON:  Chest radiograph dated 06/26/2022. FINDINGS: No focal consolidation, pleural effusion, or pneumothorax. The cardiac silhouette is within normal limits. No acute osseous pathology. Lower cervical ACDF. IMPRESSION: No active disease. Electronically Signed   By: Elgie Collard M.D.   On: 02/01/2023 19:09   DG Hip  Unilat W or Wo Pelvis 2-3 Views Left  Result Date: 02/01/2023 CLINICAL DATA:  Fall and left hip pain. EXAM: DG HIP (WITH OR WITHOUT PELVIS) 2-3V LEFT COMPARISON:  None Available. FINDINGS: Evaluation for fracture is limited due to osteopenia. The wrist angulation of the left femoral neck most consistent with a nondisplaced impacted subcapital  fracture. Further evaluation with CT is recommended. No dislocation. The soft tissues are unremarkable. IMPRESSION: Nondisplaced impacted subcapital fracture of the left femur. Further evaluation with CT is recommended. Electronically Signed   By: Elgie Collard M.D.   On: 02/01/2023 19:08   CT Head Wo Contrast  Result Date: 02/01/2023 CLINICAL DATA:  Trauma. EXAM: CT HEAD WITHOUT CONTRAST CT CERVICAL SPINE WITHOUT CONTRAST TECHNIQUE: Multidetector CT imaging of the head and cervical spine was performed following the standard protocol without intravenous contrast. Multiplanar CT image reconstructions of the cervical spine were also generated. RADIATION DOSE REDUCTION: This exam was performed according to the departmental dose-optimization program which includes automated exposure control, adjustment of the mA and/or kV according to patient size and/or use of iterative reconstruction technique. COMPARISON:  None Available. FINDINGS: CT HEAD FINDINGS Brain: The ventricles and sulci are appropriate size for patient's age. Mild periventricular and deep white matter chronic microvascular ischemic changes noted. There is no acute intracranial hemorrhage. No mass effect or midline shift. No extra-axial fluid collection. Vascular: No hyperdense vessel or unexpected calcification. Skull: Normal. Negative for fracture or focal lesion. Sinuses/Orbits: No acute finding. Other: None CT CERVICAL SPINE FINDINGS Alignment: No acute subluxation. There is straightening of normal cervical lordosis which may be positional or due to muscle spasm. Skull base and vertebrae: No acute fracture.   Osteopenia. Soft tissues and spinal canal: No prevertebral fluid or swelling. No visible canal hematoma. Disc levels: No acute findings. Degenerative changes. C5-C6 bony ankylosis. There is C6 C7 ACDF. Upper chest: Negative. Other: Bilateral carotid bulb calcified plaques. IMPRESSION: 1. No acute intracranial pathology. Mild chronic microvascular ischemic changes. 2. No acute/traumatic cervical spine pathology. Electronically Signed   By: Elgie Collard M.D.   On: 02/01/2023 19:03   CT Cervical Spine Wo Contrast  Result Date: 02/01/2023 CLINICAL DATA:  Trauma. EXAM: CT HEAD WITHOUT CONTRAST CT CERVICAL SPINE WITHOUT CONTRAST TECHNIQUE: Multidetector CT imaging of the head and cervical spine was performed following the standard protocol without intravenous contrast. Multiplanar CT image reconstructions of the cervical spine were also generated. RADIATION DOSE REDUCTION: This exam was performed according to the departmental dose-optimization program which includes automated exposure control, adjustment of the mA and/or kV according to patient size and/or use of iterative reconstruction technique. COMPARISON:  None Available. FINDINGS: CT HEAD FINDINGS Brain: The ventricles and sulci are appropriate size for patient's age. Mild periventricular and deep white matter chronic microvascular ischemic changes noted. There is no acute intracranial hemorrhage. No mass effect or midline shift. No extra-axial fluid collection. Vascular: No hyperdense vessel or unexpected calcification. Skull: Normal. Negative for fracture or focal lesion. Sinuses/Orbits: No acute finding. Other: None CT CERVICAL SPINE FINDINGS Alignment: No acute subluxation. There is straightening of normal cervical lordosis which may be positional or due to muscle spasm. Skull base and vertebrae: No acute fracture.  Osteopenia. Soft tissues and spinal canal: No prevertebral fluid or swelling. No visible canal hematoma. Disc levels: No acute findings.  Degenerative changes. C5-C6 bony ankylosis. There is C6 C7 ACDF. Upper chest: Negative. Other: Bilateral carotid bulb calcified plaques. IMPRESSION: 1. No acute intracranial pathology. Mild chronic microvascular ischemic changes. 2. No acute/traumatic cervical spine pathology. Electronically Signed   By: Elgie Collard M.D.   On: 02/01/2023 19:03   (Echo, Carotid, EGD, Colonoscopy, ERCP)    Subjective: Pt c/o fatigue    Discharge Exam: Vitals:   02/12/23 0345 02/12/23 0839  BP: (!) 150/83 (!) 157/69  Pulse: 79 76  Resp: 18 18  Temp: 98.1 F (36.7 C) 97.9 F (36.6 C)  SpO2: 96% 99%   Vitals:   02/11/23 2008 02/11/23 2332 02/12/23 0345 02/12/23 0839  BP: (!) 149/55 (!) 137/58 (!) 150/83 (!) 157/69  Pulse: 70 78 79 76  Resp: 18 18 18 18   Temp: 97.9 F (36.6 C) 98 F (36.7 C) 98.1 F (36.7 C) 97.9 F (36.6 C)  TempSrc: Oral Oral Oral   SpO2: 100% 98% 96% 99%  Weight:   72.8 kg   Height:        General: Pt is alert, awake, not in acute distress Cardiovascular: S1/S2 +, no rubs, no gallops Respiratory: CTA bilaterally, no wheezing, no rhonchi Abdominal: Soft, NT, ND, bowel sounds + Extremities:  no cyanosis    The results of significant diagnostics from this hospitalization (including imaging, microbiology, ancillary and laboratory) are listed below for reference.     Microbiology: No results found for this or any previous visit (from the past 240 hour(s)).   Labs: BNP (last 3 results) No results for input(s): "BNP" in the last 8760 hours. Basic Metabolic Panel: Recent Labs  Lab 02/06/23 0455 02/07/23 0537 02/08/23 0517 02/09/23 0520 02/11/23 0622 02/12/23 0532  NA 134* 138 139 141 136 136  K 3.2* 3.3* 3.3* 3.5 3.2* 3.2*  CL 105 111 113* 110 109 107  CO2 21* 20* 19* 22 21* 23  GLUCOSE 110* 113* 125* 116* 112* 109*  BUN 12 14 11 12 12 9   CREATININE 0.64 0.70 0.60 0.66 0.69 0.68  CALCIUM 7.7* 7.9* 8.0* 8.1* 8.1* 7.9*  MG 2.1 2.2 2.1 2.0  --   --   PHOS   --  2.2* 2.2* 2.7  --   --    Liver Function Tests: No results for input(s): "AST", "ALT", "ALKPHOS", "BILITOT", "PROT", "ALBUMIN" in the last 168 hours. No results for input(s): "LIPASE", "AMYLASE" in the last 168 hours. No results for input(s): "AMMONIA" in the last 168 hours. CBC: Recent Labs  Lab 02/06/23 0455 02/07/23 0537 02/08/23 0517 02/09/23 0520 02/11/23 0622 02/12/23 0532  WBC 13.9* 15.0* 16.4* 12.5* 11.6* 8.6  NEUTROABS 10.6* 10.4* 11.6*  --  8.3*  --   HGB 10.3* 10.1* 10.2* 10.0* 9.4* 9.1*  HCT 30.8* 30.2* 30.9* 30.3* 28.4* 27.7*  MCV 86.3 86.8 87.8 88.1 88.5 88.2  PLT 231 235 269 288 257 247   Cardiac Enzymes: No results for input(s): "CKTOTAL", "CKMB", "CKMBINDEX", "TROPONINI" in the last 168 hours. BNP: Invalid input(s): "POCBNP" CBG: No results for input(s): "GLUCAP" in the last 168 hours. D-Dimer No results for input(s): "DDIMER" in the last 72 hours. Hgb A1c No results for input(s): "HGBA1C" in the last 72 hours. Lipid Profile No results for input(s): "CHOL", "HDL", "LDLCALC", "TRIG", "CHOLHDL", "LDLDIRECT" in the last 72 hours. Thyroid function studies No results for input(s): "TSH", "T4TOTAL", "T3FREE", "THYROIDAB" in the last 72 hours.  Invalid input(s): "FREET3" Anemia work up No results for input(s): "VITAMINB12", "FOLATE", "FERRITIN", "TIBC", "IRON", "RETICCTPCT" in the last 72 hours. Urinalysis    Component Value Date/Time   COLORURINE YELLOW 08/07/2022 1149   APPEARANCEUR CLEAR 08/07/2022 1149   APPEARANCEUR Clear 08/30/2019 1307   LABSPEC <=1.005 (A) 08/07/2022 1149   PHURINE 7.0 08/07/2022 1149   GLUCOSEU NEGATIVE 08/07/2022 1149   HGBUR NEGATIVE 08/07/2022 1149   BILIRUBINUR negative 08/07/2022 1151   BILIRUBINUR NEGATIVE 08/07/2022 1149   BILIRUBINUR Negative 08/30/2019 1307   KETONESUR NEGATIVE 08/07/2022 1149   PROTEINUR Negative 08/07/2022 1151   PROTEINUR NEGATIVE  09/03/2019 1309   UROBILINOGEN 0.2 08/07/2022 1151    UROBILINOGEN 0.2 08/07/2022 1149   NITRITE negative 08/07/2022 1151   NITRITE NEGATIVE 08/07/2022 1149   LEUKOCYTESUR Negative 08/07/2022 1151   LEUKOCYTESUR TRACE (A) 08/07/2022 1149   Sepsis Labs Recent Labs  Lab 02/08/23 0517 02/09/23 0520 02/11/23 0622 02/12/23 0532  WBC 16.4* 12.5* 11.6* 8.6   Microbiology No results found for this or any previous visit (from the past 240 hour(s)).   Time coordinating discharge: Over 30 minutes  SIGNED:   Charise Killian, MD  [facility] 02/12/2023, 11:25 AM Pager   If 7PM-7AM, please contact night-coverage [email or web address]

## 2023-02-16 DIAGNOSIS — I480 Paroxysmal atrial fibrillation: Secondary | ICD-10-CM | POA: Diagnosis not present

## 2023-02-16 DIAGNOSIS — E7800 Pure hypercholesterolemia, unspecified: Secondary | ICD-10-CM | POA: Diagnosis not present

## 2023-02-16 DIAGNOSIS — I10 Essential (primary) hypertension: Secondary | ICD-10-CM | POA: Diagnosis not present

## 2023-02-16 DIAGNOSIS — W010XXA Fall on same level from slipping, tripping and stumbling without subsequent striking against object, initial encounter: Secondary | ICD-10-CM | POA: Diagnosis not present

## 2023-02-16 DIAGNOSIS — K5900 Constipation, unspecified: Secondary | ICD-10-CM | POA: Diagnosis not present

## 2023-02-16 DIAGNOSIS — K219 Gastro-esophageal reflux disease without esophagitis: Secondary | ICD-10-CM | POA: Diagnosis not present

## 2023-02-16 DIAGNOSIS — R519 Headache, unspecified: Secondary | ICD-10-CM | POA: Diagnosis not present

## 2023-02-16 DIAGNOSIS — N3010 Interstitial cystitis (chronic) without hematuria: Secondary | ICD-10-CM | POA: Diagnosis not present

## 2023-02-17 DIAGNOSIS — Z4889 Encounter for other specified surgical aftercare: Secondary | ICD-10-CM | POA: Diagnosis not present

## 2023-02-18 DIAGNOSIS — N3010 Interstitial cystitis (chronic) without hematuria: Secondary | ICD-10-CM | POA: Diagnosis not present

## 2023-02-18 DIAGNOSIS — K219 Gastro-esophageal reflux disease without esophagitis: Secondary | ICD-10-CM | POA: Diagnosis not present

## 2023-02-18 DIAGNOSIS — I10 Essential (primary) hypertension: Secondary | ICD-10-CM | POA: Diagnosis not present

## 2023-02-18 DIAGNOSIS — E7800 Pure hypercholesterolemia, unspecified: Secondary | ICD-10-CM | POA: Diagnosis not present

## 2023-02-18 DIAGNOSIS — I480 Paroxysmal atrial fibrillation: Secondary | ICD-10-CM | POA: Diagnosis not present

## 2023-02-18 DIAGNOSIS — K5900 Constipation, unspecified: Secondary | ICD-10-CM | POA: Diagnosis not present

## 2023-02-18 DIAGNOSIS — W010XXA Fall on same level from slipping, tripping and stumbling without subsequent striking against object, initial encounter: Secondary | ICD-10-CM | POA: Diagnosis not present

## 2023-02-20 DIAGNOSIS — K5900 Constipation, unspecified: Secondary | ICD-10-CM | POA: Diagnosis not present

## 2023-02-20 DIAGNOSIS — N3010 Interstitial cystitis (chronic) without hematuria: Secondary | ICD-10-CM | POA: Diagnosis not present

## 2023-02-20 DIAGNOSIS — W010XXA Fall on same level from slipping, tripping and stumbling without subsequent striking against object, initial encounter: Secondary | ICD-10-CM | POA: Diagnosis not present

## 2023-02-20 DIAGNOSIS — K219 Gastro-esophageal reflux disease without esophagitis: Secondary | ICD-10-CM | POA: Diagnosis not present

## 2023-02-20 DIAGNOSIS — I480 Paroxysmal atrial fibrillation: Secondary | ICD-10-CM | POA: Diagnosis not present

## 2023-02-20 DIAGNOSIS — I10 Essential (primary) hypertension: Secondary | ICD-10-CM | POA: Diagnosis not present

## 2023-02-25 DIAGNOSIS — K219 Gastro-esophageal reflux disease without esophagitis: Secondary | ICD-10-CM | POA: Diagnosis not present

## 2023-02-25 DIAGNOSIS — K5900 Constipation, unspecified: Secondary | ICD-10-CM | POA: Diagnosis not present

## 2023-02-25 DIAGNOSIS — W010XXA Fall on same level from slipping, tripping and stumbling without subsequent striking against object, initial encounter: Secondary | ICD-10-CM | POA: Diagnosis not present

## 2023-02-25 DIAGNOSIS — I480 Paroxysmal atrial fibrillation: Secondary | ICD-10-CM | POA: Diagnosis not present

## 2023-02-25 DIAGNOSIS — E7800 Pure hypercholesterolemia, unspecified: Secondary | ICD-10-CM | POA: Diagnosis not present

## 2023-02-25 DIAGNOSIS — I10 Essential (primary) hypertension: Secondary | ICD-10-CM | POA: Diagnosis not present

## 2023-02-27 DIAGNOSIS — K219 Gastro-esophageal reflux disease without esophagitis: Secondary | ICD-10-CM | POA: Diagnosis not present

## 2023-02-27 DIAGNOSIS — N3010 Interstitial cystitis (chronic) without hematuria: Secondary | ICD-10-CM | POA: Diagnosis not present

## 2023-02-27 DIAGNOSIS — K5900 Constipation, unspecified: Secondary | ICD-10-CM | POA: Diagnosis not present

## 2023-02-27 DIAGNOSIS — W010XXA Fall on same level from slipping, tripping and stumbling without subsequent striking against object, initial encounter: Secondary | ICD-10-CM | POA: Diagnosis not present

## 2023-02-27 DIAGNOSIS — I480 Paroxysmal atrial fibrillation: Secondary | ICD-10-CM | POA: Diagnosis not present

## 2023-02-27 DIAGNOSIS — R519 Headache, unspecified: Secondary | ICD-10-CM | POA: Diagnosis not present

## 2023-02-27 DIAGNOSIS — E7800 Pure hypercholesterolemia, unspecified: Secondary | ICD-10-CM | POA: Diagnosis not present

## 2023-02-27 DIAGNOSIS — I10 Essential (primary) hypertension: Secondary | ICD-10-CM | POA: Diagnosis not present

## 2023-03-02 DIAGNOSIS — I480 Paroxysmal atrial fibrillation: Secondary | ICD-10-CM | POA: Diagnosis not present

## 2023-03-02 DIAGNOSIS — I2510 Atherosclerotic heart disease of native coronary artery without angina pectoris: Secondary | ICD-10-CM | POA: Diagnosis not present

## 2023-03-02 DIAGNOSIS — R251 Tremor, unspecified: Secondary | ICD-10-CM | POA: Diagnosis not present

## 2023-03-02 DIAGNOSIS — M80052D Age-related osteoporosis with current pathological fracture, left femur, subsequent encounter for fracture with routine healing: Secondary | ICD-10-CM | POA: Diagnosis not present

## 2023-03-02 DIAGNOSIS — I10 Essential (primary) hypertension: Secondary | ICD-10-CM | POA: Diagnosis not present

## 2023-03-02 DIAGNOSIS — N3010 Interstitial cystitis (chronic) without hematuria: Secondary | ICD-10-CM | POA: Diagnosis not present

## 2023-03-02 DIAGNOSIS — K5900 Constipation, unspecified: Secondary | ICD-10-CM | POA: Diagnosis not present

## 2023-03-02 DIAGNOSIS — F419 Anxiety disorder, unspecified: Secondary | ICD-10-CM | POA: Diagnosis not present

## 2023-03-02 DIAGNOSIS — E7800 Pure hypercholesterolemia, unspecified: Secondary | ICD-10-CM | POA: Diagnosis not present

## 2023-03-04 ENCOUNTER — Telehealth: Payer: Self-pay | Admitting: Internal Medicine

## 2023-03-04 MED ORDER — APIXABAN 5 MG PO TABS: 5.0000 mg | ORAL_TABLET | Freq: Two times a day (BID) | ORAL | 0 refills | Status: DC

## 2023-03-04 NOTE — Telephone Encounter (Signed)
Medication refilled. Daughter aware.

## 2023-03-04 NOTE — Telephone Encounter (Signed)
Ok to refill eliquis

## 2023-03-04 NOTE — Telephone Encounter (Signed)
Ok to refill for 30 days until she sees cardiology?

## 2023-03-04 NOTE — Telephone Encounter (Signed)
Pt daughter called in asking if Dr. Lorin Picket can refill her apixaban (ELIQUIS) 5 MG TABS tablet med to be sent to [facility]. She said that this med was prescribed by her heart doctor, however, she does not have an appt until 2 weeks from now, and her last pill will be tomorrow. Pt does have an appt with Dr. Lorin Picket tomorrow 03/05/23

## 2023-03-05 ENCOUNTER — Ambulatory Visit (INDEPENDENT_AMBULATORY_CARE_PROVIDER_SITE_OTHER): Payer: Medicare PPO | Admitting: Internal Medicine

## 2023-03-05 ENCOUNTER — Encounter: Payer: Self-pay | Admitting: Internal Medicine

## 2023-03-05 VITALS — BP 108/66 | HR 68 | Temp 97.9°F | Resp 16 | Ht 64.0 in | Wt 138.8 lb

## 2023-03-05 DIAGNOSIS — D649 Anemia, unspecified: Secondary | ICD-10-CM | POA: Diagnosis not present

## 2023-03-05 DIAGNOSIS — I480 Paroxysmal atrial fibrillation: Secondary | ICD-10-CM | POA: Diagnosis not present

## 2023-03-05 DIAGNOSIS — I700 Atherosclerosis of aorta: Secondary | ICD-10-CM | POA: Diagnosis not present

## 2023-03-05 DIAGNOSIS — F439 Reaction to severe stress, unspecified: Secondary | ICD-10-CM

## 2023-03-05 DIAGNOSIS — K219 Gastro-esophageal reflux disease without esophagitis: Secondary | ICD-10-CM

## 2023-03-05 DIAGNOSIS — I10 Essential (primary) hypertension: Secondary | ICD-10-CM | POA: Diagnosis not present

## 2023-03-05 DIAGNOSIS — E7800 Pure hypercholesterolemia, unspecified: Secondary | ICD-10-CM

## 2023-03-05 DIAGNOSIS — N3010 Interstitial cystitis (chronic) without hematuria: Secondary | ICD-10-CM

## 2023-03-05 DIAGNOSIS — Z8781 Personal history of (healed) traumatic fracture: Secondary | ICD-10-CM

## 2023-03-05 DIAGNOSIS — J189 Pneumonia, unspecified organism: Secondary | ICD-10-CM

## 2023-03-05 DIAGNOSIS — R739 Hyperglycemia, unspecified: Secondary | ICD-10-CM

## 2023-03-05 LAB — CBC WITH DIFFERENTIAL/PLATELET
Basophils Absolute: 0.1 10*3/uL (ref 0.0–0.1)
Basophils Relative: 1 % (ref 0.0–3.0)
Eosinophils Absolute: 0 10*3/uL (ref 0.0–0.7)
Eosinophils Relative: 0.8 % (ref 0.0–5.0)
HCT: 32 % — ABNORMAL LOW (ref 36.0–46.0)
Hemoglobin: 10.4 g/dL — ABNORMAL LOW (ref 12.0–15.0)
Lymphocytes Relative: 28.9 % (ref 12.0–46.0)
Lymphs Abs: 1.5 10*3/uL (ref 0.7–4.0)
MCHC: 32.3 g/dL (ref 30.0–36.0)
MCV: 89 fl (ref 78.0–100.0)
Monocytes Absolute: 0.4 10*3/uL (ref 0.1–1.0)
Monocytes Relative: 8.6 % (ref 3.0–12.0)
Neutro Abs: 3.1 10*3/uL (ref 1.4–7.7)
Neutrophils Relative %: 60.7 % (ref 43.0–77.0)
Platelets: 211 10*3/uL (ref 150.0–400.0)
RBC: 3.6 Mil/uL — ABNORMAL LOW (ref 3.87–5.11)
RDW: 16.1 % — ABNORMAL HIGH (ref 11.5–15.5)
WBC: 5.2 10*3/uL (ref 4.0–10.5)

## 2023-03-05 LAB — IBC + FERRITIN
Ferritin: 28.7 ng/mL (ref 10.0–291.0)
Iron: 38 ug/dL — ABNORMAL LOW (ref 42–145)
Saturation Ratios: 10.5 % — ABNORMAL LOW (ref 20.0–50.0)
TIBC: 362.6 ug/dL (ref 250.0–450.0)
Transferrin: 259 mg/dL (ref 212.0–360.0)

## 2023-03-05 LAB — LIPID PANEL
Cholesterol: 140 mg/dL (ref 0–200)
HDL: 54.7 mg/dL (ref >39.00)
LDL Cholesterol: 70 mg/dL (ref 0–99)
NonHDL: 85.36
Total CHOL/HDL Ratio: 3
Triglycerides: 79 mg/dL (ref 0.0–149.0)
VLDL: 15.8 mg/dL (ref 0.0–40.0)

## 2023-03-05 LAB — HEPATIC FUNCTION PANEL
ALT: 9 U/L (ref 0–35)
AST: 13 U/L (ref 0–37)
Albumin: 3.9 g/dL (ref 3.5–5.2)
Alkaline Phosphatase: 99 U/L (ref 39–117)
Bilirubin, Direct: 0.1 mg/dL (ref 0.0–0.3)
Total Bilirubin: 0.6 mg/dL (ref 0.2–1.2)
Total Protein: 6.4 g/dL (ref 6.0–8.3)

## 2023-03-05 LAB — BASIC METABOLIC PANEL
BUN: 9 mg/dL (ref 6–23)
CO2: 26 mEq/L (ref 19–32)
Calcium: 8.8 mg/dL (ref 8.4–10.5)
Chloride: 104 mEq/L (ref 96–112)
Creatinine, Ser: 0.96 mg/dL (ref 0.40–1.20)
GFR: 54.85 mL/min — ABNORMAL LOW (ref >60.00)
Glucose, Bld: 114 mg/dL — ABNORMAL HIGH (ref 70–99)
Potassium: 4.2 mEq/L (ref 3.5–5.1)
Sodium: 138 mEq/L (ref 135–145)

## 2023-03-05 LAB — VITAMIN B12: Vitamin B-12: 203 pg/mL — ABNORMAL LOW (ref 211–911)

## 2023-03-05 LAB — HEMOGLOBIN A1C: Hgb A1c MFr Bld: 5.2 % (ref 4.6–6.5)

## 2023-03-05 MED ORDER — LOVASTATIN 20 MG PO TABS: 20.0000 mg | ORAL_TABLET | Freq: Every day | ORAL | 1 refills | Status: DC

## 2023-03-05 MED ORDER — SERTRALINE HCL 50 MG PO TABS: 50.0000 mg | ORAL_TABLET | Freq: Every day | ORAL | 1 refills | Status: DC

## 2023-03-05 NOTE — Progress Notes (Signed)
Subjective:    [name], female    DOB: [birth year], 82 y.o.   MRN: [id number]  Patient here for  Chief Complaint  Patient presents with   Hospitalization Follow-up    HPI Here for hospital follow up.  Admitted 02/01/23 - 02/12/23 - after falling at home.  Presented with left groin pain and headache. CT of the head and cervical spine were negative for acute findings. Chest x-ray also negative for acute disease. Plain radiographs of the left hip demonstrated subcapital left femur fracture. Is S/p percutaneous fixation of left femoral neck hip fracture on 02/02/2023.  Continues f/u with ortho.  Also found to have multifocal pneumonia.  Treated with abx.  Recommended continued incentive spirometry.  Respiratory status (hypoxia) improved.  No acute PE on CT chest.  Did have afib with RVR.  Echo showed EF estimated at 65 to 70%, mild LVH, grade 1 diastolic dysfunction, mild to moderate TR.  Discharged on coreg and eliquis.  Discharged to SNF.  Has planned f/u with cardiology.  Has been home this past week.  Able to so ADLs.  No chest pain reported.  Breathing stable.  No increased heart rate or palpitations reported.  Has f/u planned with ortho and cardiology.  Eating.  No vomiting.  Some fatigue, but energy has improved.     Past Medical History:  Diagnosis Date   AK (actinic keratosis) 11/12/2022   left upper arm, tx'd with EDC   AK (actinic keratosis) 11/12/2022   right medial pretibia, LN2 11/25/22   Basal cell carcinoma 06/21/2020   left post shoulder sup, left mid chest, left lat thigh   Basal cell carcinoma 11/07/2021   L upper back, EDC   Basal cell carcinoma 11/07/2021   R upper back, EDC   Basal cell carcinoma 11/27/2021   right upper arm, EDC   Basal cell carcinoma 11/27/2021   right shoulder posterior, EDC   Basal cell carcinoma 11/27/2021   right anterior shoulder, EDC   Basal cell carcinoma (BCC) 06/13/2021   left dorsal foot, EDC 10/02/2021   Basal cell  carcinoma (BCC) 06/13/2021   right postauricular tx'd w/ EDC   BCC (basal cell carcinoma of skin) 03/30/2007   R inf med pretibial - BCC   BCC (basal cell carcinoma of skin) 10/12/2013   L nasal ala - BCC   BCC (basal cell carcinoma of skin) 03/10/2018   L mid dorsum med forearm - superficial BCC    BCC (basal cell carcinoma) 10/02/2021   left dorsal foot proximal, EDC 10/02/2021   BCC (basal cell carcinoma) 10/02/2021   left mid back, superficial EDC 11/07/21   BCC (basal cell carcinoma) 10/02/2021   right pretibia   BCC (basal cell carcinoma) 10/02/2021   right mid back, superficial EDC 11/07/21   BCC (basal cell carcinoma) 08/14/2022   left distal calf, tx'd with EDC   BCC (basal cell carcinoma) 11/12/2022   superficial at left lateral pretibial,l tx'd with EDC   BCC (basal cell carcinoma) 11/12/2022   superficial at mid back right of midline, scheduled for EDC   BCC (basal cell carcinoma) 11/12/2022   mid back right of midline, [facility] 11/25/22   Breast screening, unspecified 2013   Cancer (HCC) 1992   skin   Degenerative disk disease    GERD (gastroesophageal reflux disease)    History of basal cell carcinoma (BCC) 08/29/2020    left inferior knee lateral, , left inferior knee medial, right pretibia  History of SCC (squamous cell carcinoma) of skin 08/29/2020   right posterior calf, left lateral calf, and    Hypercholesterolemia 2008   Interstitial cystitis    followed by Dr Achilles Dunk   Other sign and symptom in breast 2013   Left upper outer quadrant breast "soreness" Ultrasound exam of right breast in the 2 o'clock position with the breast distracted medially showed a 0.3-0.4 with 0.5 cm simple cyst. In the 1 o'clock position where pt reported tenderness US exam was negatiive.  Because of her history of intermittent nipple drainage, ultrasound was completed of the retroareolar area.   Personal history of tobacco use, presenting hazards to health    PONV (postoperative nausea and  vomiting)    SCC (squamous cell carcinoma) 03/28/2008   R dorsum hand - SCC   SCC (squamous cell carcinoma) 05/09/2009   R lat lower leg - SCCIS   SCC (squamous cell carcinoma) 05/09/2009   L lat lower leg - SCCIS   SCC (squamous cell carcinoma) 04/07/2013   L lower leg - SCCIS   SCC (squamous cell carcinoma) 04/27/2013   R forearm - SCC   SCC (squamous cell carcinoma) 04/28/2017   L lat knee - SCCIS   SCC (squamous cell carcinoma) 05/12/2018   L prox dorsum forearm - SCC   SCC (squamous cell carcinoma) 06/13/2021   left forearm tx'd w/ EDC   SCC (squamous cell carcinoma), leg, right 03/30/2007   R sup pretibial - SCCIS   SCC (squamous cell carcinoma);BCC 10/12/2013   Mid back - superficial BCC with SCCIS    Special screening for malignant neoplasms, colon    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   left dorsal forearm, left lat calf   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   left lower leg superior, EDC at follow up   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   chest right of midline, tx'd with [facility]   Past Surgical History:  Procedure Laterality Date   ABDOMINAL HYSTERECTOMY  1992   APPENDECTOMY     CERVICAL DISCECTOMY     S/P C7-T1 discectomy with fusion   CHOLECYSTECTOMY  1990   COLONOSCOPY WITH PROPOFOL N/A 02/14/2016   Procedure: COLONOSCOPY WITH PROPOFOL;  Surgeon: Midge Minium, MD;  Location: [facility];  Service: Endoscopy;  Laterality: N/A;  PT WOULD LIKE 10 ARRIVAL TIME OR LATER   DILATION AND CURETTAGE OF UTERUS     ESOPHAGOGASTRODUODENOSCOPY (EGD) WITH PROPOFOL N/A 02/14/2016   Procedure: ESOPHAGOGASTRODUODENOSCOPY (EGD) WITH PROPOFOL with dialtion;  Surgeon: Midge Minium, MD;  Location: [facility];  Service: Endoscopy;  Laterality: N/A;   ESOPHAGOGASTRODUODENOSCOPY (EGD) WITH PROPOFOL N/A 04/13/2019   Procedure: ESOPHAGOGASTRODUODENOSCOPY (EGD) WITH PROPOFOL;  Surgeon: Toledo, Boykin Nearing, MD;  Location: [facility] END[facility]COPY;   Service: Gastroenterology;  Laterality: N/A;   HIP PINNING,CANNULATED Left 02/02/2023   Procedure: PERCUTANEOUS FIXATION OF FEMORAL NECK;  Surgeon: Juanell Fairly, MD;  Location: [facility] ORS;  Service: Orthopedics;  Laterality: Left;   MELANOMA EXCISION     removed from Left calf 1994   Family History  Problem Relation Age of Onset   Hodgkin's lymphoma Mother    Heart failure Father    Heart attack Father    Arthritis Sister        Three sisters w/ degeneratve disk disease   Headache Sister        Two sisters hx of headache   Breast cancer Neg Hx    Colon cancer Neg Hx  Bladder Cancer Neg Hx    Kidney cancer Neg Hx    Social History   Socioeconomic History   Marital status: Widowed    Spouse name: Not on file   Number of children: 2   Years of education: Not on file   Highest education level: Not on file  Occupational History   Not on file  Tobacco Use   Smoking status: Former    Packs/day: 1.00    Years: 15.00    Additional pack years: 0.00    Total pack years: 15.00    Types: Cigarettes   Smokeless tobacco: Never  Vaping Use   Vaping Use: Never used  Substance and Sexual Activity   Alcohol use: No    Alcohol/week: 0.0 standard drinks of alcohol   Drug use: No   Sexual activity: Not on file  Other Topics Concern   Not on file  Social History Narrative   Married and has 2 children, daughters.   Social Determinants of Health   Financial Resource Strain: Not on file  Food Insecurity: No Food Insecurity (02/11/2023)   Hunger Vital Sign    Worried About Running Out of Food in the Last Year: Never true    Ran Out of Food in the Last Year: Never true  Transportation Needs: No Transportation Needs (02/11/2023)   PRAPARE - Administrator, Civil Service (Medical): No    Lack of Transportation (Non-Medical): No  Physical Activity: Not on file  Stress: Not on file  Social Connections: Not on file     Review of Systems  Constitutional:  Negative for  appetite change and unexpected weight change.  HENT:  Negative for congestion and sinus pressure.   Respiratory:  Negative for cough, chest tightness and shortness of breath.   Cardiovascular:  Negative for chest pain, palpitations and leg swelling.  Gastrointestinal:  Negative for abdominal pain, diarrhea, nausea and vomiting.  Genitourinary:  Negative for difficulty urinating and dysuria.  Musculoskeletal:  Negative for joint swelling and myalgias.  Neurological:  Negative for dizziness and headaches.  Psychiatric/Behavioral:  Negative for agitation and dysphoric mood.        Objective:     BP 108/66   Pulse 68   Temp 97.9 F (36.6 C)   Resp 16   Ht 5\' 4"  (1.626 m)   Wt 138 lb 12.8 oz (63 kg)   LMP 11/20/1976   SpO2 98%   BMI 23.82 kg/m  Wt Readings from Last 3 Encounters:  03/05/23 138 lb 12.8 oz (63 kg)  02/12/23 160 lb 7.9 oz (72.8 kg)  11/04/22 146 lb 12.8 oz (66.6 kg)    Physical Exam Vitals reviewed.  Constitutional:      General: She is not in acute distress.    Appearance: Normal appearance.  HENT:     Head: Normocephalic and atraumatic.     Right Ear: External ear normal.     Left Ear: External ear normal.  Eyes:     General: No scleral icterus.       Right eye: No discharge.        Left eye: No discharge.     Conjunctiva/sclera: Conjunctivae normal.  Neck:     Thyroid: No thyromegaly.  Cardiovascular:     Rate and Rhythm: Normal rate and regular rhythm.  Pulmonary:     Effort: No respiratory distress.     Breath sounds: Normal breath sounds. No wheezing.  Abdominal:     General: Bowel sounds are  normal.     Palpations: Abdomen is soft.     Tenderness: There is no abdominal tenderness.  Musculoskeletal:        General: No swelling or tenderness.     Cervical back: Neck supple. No tenderness.     Comments: Healed incision site.   Lymphadenopathy:     Cervical: No cervical adenopathy.  Skin:    Findings: No erythema or rash.  Neurological:      Mental Status: She is alert.  Psychiatric:        Mood and Affect: Mood normal.        Behavior: Behavior normal.      Outpatient Encounter Medications as of 03/05/2023  Medication Sig   acetaminophen (TYLENOL) 325 MG tablet Take 650 mg by mouth as needed.   apixaban (ELIQUIS) 5 MG TABS tablet Take 1 tablet (5 mg total) by mouth 2 (two) times daily.   carvedilol (COREG) 25 MG tablet Take 1 tablet (25 mg total) by mouth 2 (two) times daily with a meal.   esomeprazole (NEXIUM) 40 MG capsule Take 1 capsule (40 mg total) by mouth daily.   hydrALAZINE (APRESOLINE) 25 MG tablet Take 1 tablet (25 mg total) by mouth 2 (two) times daily.   lovastatin (MEVACOR) 20 MG tablet Take 1 tablet (20 mg total) by mouth at bedtime.   sertraline (ZOLOFT) 50 MG tablet Take 1 tablet (50 mg total) by mouth daily.   trospium (SANCTURA) 20 MG tablet Take 1 tablet (20 mg total) by mouth daily.   [DISCONTINUED] lovastatin (MEVACOR) 20 MG tablet Take 1 tablet (20 mg total) by mouth at bedtime.   [DISCONTINUED] sertraline (ZOLOFT) 50 MG tablet Take 1 tablet by mouth once daily   No facility-administered encounter medications on file as of 03/05/2023.     Lab Results  Component Value Date   WBC 5.2 03/05/2023   HGB 10.4 (L) 03/05/2023   HCT 32.0 (L) 03/05/2023   PLT 211.0 03/05/2023   GLUCOSE 114 (H) 03/05/2023   CHOL 140 03/05/2023   TRIG 79.0 03/05/2023   HDL 54.70 03/05/2023   LDLCALC 70 03/05/2023   ALT 9 03/05/2023   AST 13 03/05/2023   NA 138 03/05/2023   K 4.2 03/05/2023   CL 104 03/05/2023   CREATININE 0.96 03/05/2023   BUN 9 03/05/2023   CO2 26 03/05/2023   TSH 4.50 11/04/2022   HGBA1C 5.2 03/05/2023    DG ABD ACUTE 2+V W 1V CHEST  Result Date: 02/04/2023 CLINICAL DATA:  Gastric outlet obstruction. EXAM: DG ABDOMEN ACUTE WITH 1 VIEW CHEST COMPARISON:  Feb 01, 2023. FINDINGS: Mildly dilated small bowel loops are noted concerning for distal small bowel obstruction or ileus. Residual contrast is  noted in urinary bladder. No radiopaque calculi or other significant radiographic abnormality is seen. Heart size and mediastinal contours are within normal limits. Mild bilateral perihilar opacities are noted concerning for pulmonary edema or inflammation, right greater than left. IMPRESSION: Dilated small bowel loops are noted concerning for distal small bowel obstruction or ileus. Bilateral lung opacities are noted, right greater than left, concerning for pulmonary edema or inflammation. Electronically Signed   By: Lupita Raider M.D.   On: 02/04/2023 12:55   ECHOCARDIOGRAM COMPLETE  Result Date: 02/04/2023    ECHOCARDIOGRAM REPORT   Patient Name:   Rachael Austin [facility] Date of Exam: 02/03/2023 Medical Rec #:  [id number]          Height:       64.0 in  Accession #:    [id number]         Weight:       142.0 lb Date of Birth:  [birth year]          BSA:          1.691 m Patient Age:    37 years           BP:           159/63 mmHg Patient Gender: F                  HR:           115 bpm. Exam Location:  [facility] Procedure: 2D Echo, Cardiac Doppler and Color Doppler Indications:     R07.9 Chest pain  History:         Patient has prior history of Echocardiogram examinations, most                  recent 11/21/2021.  Sonographer:     Daphine Deutscher RDCS Referring Phys:  [id number] Diagnosing Phys: Yvonne Kendall MD IMPRESSIONS  1. Left ventricular ejection fraction, by estimation, is 65 to 70%. The left ventricle has normal function. The left ventricle has no regional wall motion abnormalities. There is mild left ventricular hypertrophy. Left ventricular diastolic parameters are consistent with Grade I diastolic dysfunction (impaired relaxation). Elevated left atrial pressure.  2. Right ventricular systolic function is mildly reduced. The right ventricular size is normal. There is normal pulmonary artery systolic pressure.  3. The mitral valve is normal in structure. Trivial mitral valve regurgitation. No  evidence of mitral stenosis.  4. Tricuspid valve regurgitation is mild to moderate.  5. The aortic valve has an indeterminant number of cusps. There is mild calcification of the aortic valve. There is mild thickening of the aortic valve. Aortic valve regurgitation is not visualized. Aortic valve sclerosis/calcification is present, without any evidence of aortic stenosis.  6. The inferior vena cava is normal in size with <50% respiratory variability, suggesting right atrial pressure of 8 mmHg. FINDINGS  Left Ventricle: Left ventricular ejection fraction, by estimation, is 65 to 70%. The left ventricle has normal function. The left ventricle has no regional wall motion abnormalities. The left ventricular internal cavity size was normal in size. There is  mild left ventricular hypertrophy. Left ventricular diastolic parameters are consistent with Grade I diastolic dysfunction (impaired relaxation). Elevated left atrial pressure. Right Ventricle: The right ventricular size is normal. No increase in right ventricular wall thickness. Right ventricular systolic function is mildly reduced. There is normal pulmonary artery systolic pressure. The tricuspid regurgitant velocity is 2.50 m/s, and with an assumed right atrial pressure of 8 mmHg, the estimated right ventricular systolic pressure is 33.0 mmHg. Left Atrium: Left atrial size was normal in size. Right Atrium: Right atrial size was normal in size. Pericardium: There is no evidence of pericardial effusion. Mitral Valve: The mitral valve is normal in structure. Trivial mitral valve regurgitation. No evidence of mitral valve stenosis. Tricuspid Valve: The tricuspid valve is normal in structure. Tricuspid valve regurgitation is mild to moderate. Aortic Valve: The aortic valve has an indeterminant number of cusps. There is mild calcification of the aortic valve. There is mild thickening of the aortic valve. Aortic valve regurgitation is not visualized. Aortic valve  sclerosis/calcification is present, without any evidence of aortic stenosis. Aortic valve mean gradient measures 9.0 mmHg. Aortic valve peak gradient measures 11.4 mmHg. Aortic valve area, by VTI measures 2.05  cm. Pulmonic Valve: The pulmonic valve was normal in structure. Pulmonic valve regurgitation is trivial. No evidence of pulmonic stenosis. Aorta: The aortic root is normal in size and structure. Pulmonary Artery: The pulmonary artery is of normal size. Venous: The inferior vena cava is normal in size with less than 50% respiratory variability, suggesting right atrial pressure of 8 mmHg. IAS/Shunts: The interatrial septum was not well visualized.  LEFT VENTRICLE PLAX 2D LVIDd:         3.40 cm   Diastology LVIDs:         2.30 cm   LV e' medial:    6.20 cm/s LV PW:         1.20 cm   LV E/e' medial:  17.3 LV IVS:        1.20 cm   LV e' lateral:   6.05 cm/s LVOT diam:     1.90 cm   LV E/e' lateral: 17.7 LV SV:         53 LV SV Index:   31 LVOT Area:     2.84 cm  RIGHT VENTRICLE             IVC RV Basal diam:  3.30 cm     IVC diam: 1.60 cm RV S prime:     10.22 cm/s LEFT ATRIUM             Index        RIGHT ATRIUM           Index LA diam:        4.20 cm 2.48 cm/m   RA Area:     10.90 cm LA Vol (A2C):   41.3 ml 24.42 ml/m  RA Volume:   25.70 ml  15.19 ml/m LA Vol (A4C):   36.5 ml 21.58 ml/m LA Biplane Vol: 39.2 ml 23.18 ml/m  AORTIC VALVE AV Area (Vmax):    1.83 cm AV Area (Vmean):   1.80 cm AV Area (VTI):     2.05 cm AV Vmax:           168.78 cm/s AV Vmean:          118.474 cm/s AV VTI:            0.259 m AV Peak Grad:      11.4 mmHg AV Mean Grad:      9.0 mmHg LVOT Vmax:         108.75 cm/s LVOT Vmean:        75.350 cm/s LVOT VTI:          0.188 m LVOT/AV VTI ratio: 0.72  AORTA Ao Root diam: 3.30 cm Ao Asc diam:  2.90 cm MITRAL VALVE                TRICUSPID VALVE MV Area (PHT): 2.93 cm     TR Peak grad:   25.0 mmHg MV Decel Time: 259 msec     TR Vmax:        250.00 cm/s MV E velocity: 107.14 cm/s MV A  velocity: 144.50 cm/s  SHUNTS MV E/A ratio:  0.74         Systemic VTI:  0.19 m                             Systemic Diam: 1.90 cm Yvonne Kendall MD Electronically signed by Yvonne Kendall MD Signature Date/Time: 02/04/2023/7:35:40 AM    Final    CT  Angio Chest Pulmonary Embolism (PE) W or WO Contrast  Result Date: 02/03/2023 CLINICAL DATA:  High probability of pulmonary embolus. EXAM: CT ANGIOGRAPHY CHEST WITH CONTRAST TECHNIQUE: Multidetector CT imaging of the chest was performed using the standard protocol during bolus administration of intravenous contrast. Multiplanar CT image reconstructions and MIPs were obtained to evaluate the vascular anatomy. RADIATION DOSE REDUCTION: This exam was performed according to the departmental dose-optimization program which includes automated exposure control, adjustment of the mA and/or kV according to patient size and/or use of iterative reconstruction technique. CONTRAST:  60mL OMNIPAQUE IOHEXOL 350 MG/ML SOLN COMPARISON:  None Available. FINDINGS: Cardiovascular: Satisfactory opacification of the pulmonary arteries to the segmental level. No evidence of pulmonary embolism. Normal heart size. No pericardial effusion. Coronary artery calcifications are noted. Mediastinum/Nodes: Thyroid gland is unremarkable. No adenopathy is noted. Severely dilated and fluid-filled esophagus is noted. Lungs/Pleura: No pneumothorax or pleural effusion is noted. Patchy airspace opacities are noted throughout the upper and lower lobes bilaterally most consistent with multifocal pneumonia. Upper Abdomen: Moderate to severe gastric distention is noted. Musculoskeletal: No chest wall abnormality. No acute or significant osseous findings. Review of the MIP images confirms the above findings. IMPRESSION: No definite evidence of pulmonary embolus. Patchy airspace opacities are noted throughout both lungs most consistent with multifocal pneumonia. Severely dilated and fluid-filled esophagus is  noted as well as moderate to severe gastric distention seen in visualized portion of upper abdomen. This is concerning for gastric outlet obstruction. Coronary artery calcifications are noted suggesting coronary artery disease. Aortic Atherosclerosis (ICD10-I70.0). Electronically Signed   By: Lupita Raider M.D.   On: 02/03/2023 19:03       Assessment & Plan:  Essential hypertension Assessment & Plan: Intolerant to amlodipine.  On hydralazine.  Also on coreg. Pressure as outlined. No change today.  Follow metabolic panel.   Orders: -     Basic metabolic panel  Hyperglycemia Assessment & Plan: Low carb diet and exercise. Follow met b and a1c.   Orders: -     Hemoglobin A1c  Hypercholesterolemia Assessment & Plan: Continue mevacor.  Low cholesterol diet and exercise.  Follow lipid panel and liver function tests.   Orders: -     Hepatic function panel -     Lipid panel  Anemia, unspecified type Assessment & Plan: Noted while in the hospital.  Recheck cbc, iron studies and B12 today.   Orders: -     CBC with Differential/Platelet -     Vitamin B12 -     IBC + Ferritin  PAF (paroxysmal atrial fibrillation) (HCC) Assessment & Plan: Noted during recent hospitalization.  Appears to be in SR today.  On eliquis and coreg.  Keep f/u appt with cardiology.  Continue current medication regimen for now.   Orders: -     AMB Referral to Pharmacy Medication Management  Aortic atherosclerosis (HCC) Assessment & Plan: Continue mevacor.    Chronic interstitial cystitis Assessment & Plan: Followed by urology.  On sanctura.    Gastroesophageal reflux disease, unspecified whether esophagitis present Assessment & Plan: No upper symptoms reported.  Continue nexium.    History of hip fracture Assessment & Plan:  Dr Martha Clan - 02/02/23 - PERCUTANEOUS FIXATION OF LEFT FEMORAL NECK HIP FRACTURE.  Was in rehab and now home.  Doing well.  Continue f/u with ortho.     Multifocal  pneumonia Assessment & Plan: Diagnosed in hospital and treated.  Breathing back to baseline. No increased cough or congestion.  Will need f/u  cxr in the next several weeks to confirm clear.    Stress Assessment & Plan: Continue zoloft.  Follow.     Other orders -     Lovastatin; Take 1 tablet (20 mg total) by mouth at bedtime.  Dispense: 90 tablet; Refill: 1 -     Sertraline HCl; Take 1 tablet (50 mg total) by mouth daily.  Dispense: 90 tablet; Refill: 1     Dale Fortuna, MD

## 2023-03-08 ENCOUNTER — Encounter: Payer: Self-pay | Admitting: Internal Medicine

## 2023-03-08 NOTE — Assessment & Plan Note (Signed)
Low carb diet and exercise.  Follow met b and a1c.   

## 2023-03-08 NOTE — Assessment & Plan Note (Signed)
Continue zoloft.  Follow.   

## 2023-03-08 NOTE — Assessment & Plan Note (Signed)
Continue mevacor.  Low cholesterol diet and exercise.  Follow lipid panel and liver function tests.  

## 2023-03-08 NOTE — Assessment & Plan Note (Signed)
Intolerant to amlodipine.  On hydralazine.  Also on coreg. Pressure as outlined. No change today.  Follow metabolic panel.

## 2023-03-08 NOTE — Assessment & Plan Note (Signed)
Dr Martha Clan - 02/02/23 - PERCUTANEOUS FIXATION OF LEFT FEMORAL NECK HIP FRACTURE.  Was in rehab and now home.  Doing well.  Continue f/u with ortho.

## 2023-03-08 NOTE — Assessment & Plan Note (Signed)
Diagnosed in hospital and treated.  Breathing back to baseline. No increased cough or congestion.  Will need f/u cxr in the next several weeks to confirm clear.

## 2023-03-08 NOTE — Assessment & Plan Note (Signed)
Noted during recent hospitalization.  Appears to be in SR today.  On eliquis and coreg.  Keep f/u appt with cardiology.  Continue current medication regimen for now.

## 2023-03-08 NOTE — Assessment & Plan Note (Signed)
Continue mevacor °

## 2023-03-08 NOTE — Assessment & Plan Note (Signed)
No upper symptoms reported.  Continue nexium.  

## 2023-03-08 NOTE — Assessment & Plan Note (Signed)
Noted while in the hospital.  Recheck cbc, iron studies and B12 today.

## 2023-03-08 NOTE — Assessment & Plan Note (Signed)
Followed by urology.  On sanctura.

## 2023-03-10 DIAGNOSIS — I2510 Atherosclerotic heart disease of native coronary artery without angina pectoris: Secondary | ICD-10-CM | POA: Diagnosis not present

## 2023-03-10 DIAGNOSIS — N3010 Interstitial cystitis (chronic) without hematuria: Secondary | ICD-10-CM | POA: Diagnosis not present

## 2023-03-10 DIAGNOSIS — I480 Paroxysmal atrial fibrillation: Secondary | ICD-10-CM | POA: Diagnosis not present

## 2023-03-10 DIAGNOSIS — M80052D Age-related osteoporosis with current pathological fracture, left femur, subsequent encounter for fracture with routine healing: Secondary | ICD-10-CM | POA: Diagnosis not present

## 2023-03-10 DIAGNOSIS — R251 Tremor, unspecified: Secondary | ICD-10-CM | POA: Diagnosis not present

## 2023-03-10 DIAGNOSIS — E7800 Pure hypercholesterolemia, unspecified: Secondary | ICD-10-CM | POA: Diagnosis not present

## 2023-03-10 DIAGNOSIS — K5900 Constipation, unspecified: Secondary | ICD-10-CM | POA: Diagnosis not present

## 2023-03-10 DIAGNOSIS — F419 Anxiety disorder, unspecified: Secondary | ICD-10-CM | POA: Diagnosis not present

## 2023-03-10 DIAGNOSIS — I10 Essential (primary) hypertension: Secondary | ICD-10-CM | POA: Diagnosis not present

## 2023-03-12 DIAGNOSIS — R251 Tremor, unspecified: Secondary | ICD-10-CM | POA: Diagnosis not present

## 2023-03-12 DIAGNOSIS — E7800 Pure hypercholesterolemia, unspecified: Secondary | ICD-10-CM | POA: Diagnosis not present

## 2023-03-12 DIAGNOSIS — I480 Paroxysmal atrial fibrillation: Secondary | ICD-10-CM | POA: Diagnosis not present

## 2023-03-12 DIAGNOSIS — I10 Essential (primary) hypertension: Secondary | ICD-10-CM | POA: Diagnosis not present

## 2023-03-12 DIAGNOSIS — M80052D Age-related osteoporosis with current pathological fracture, left femur, subsequent encounter for fracture with routine healing: Secondary | ICD-10-CM | POA: Diagnosis not present

## 2023-03-12 DIAGNOSIS — F419 Anxiety disorder, unspecified: Secondary | ICD-10-CM | POA: Diagnosis not present

## 2023-03-12 DIAGNOSIS — N3010 Interstitial cystitis (chronic) without hematuria: Secondary | ICD-10-CM | POA: Diagnosis not present

## 2023-03-12 DIAGNOSIS — I2510 Atherosclerotic heart disease of native coronary artery without angina pectoris: Secondary | ICD-10-CM | POA: Diagnosis not present

## 2023-03-12 DIAGNOSIS — K5900 Constipation, unspecified: Secondary | ICD-10-CM | POA: Diagnosis not present

## 2023-03-13 DIAGNOSIS — I2510 Atherosclerotic heart disease of native coronary artery without angina pectoris: Secondary | ICD-10-CM | POA: Diagnosis not present

## 2023-03-13 DIAGNOSIS — R251 Tremor, unspecified: Secondary | ICD-10-CM | POA: Diagnosis not present

## 2023-03-13 DIAGNOSIS — N3010 Interstitial cystitis (chronic) without hematuria: Secondary | ICD-10-CM | POA: Diagnosis not present

## 2023-03-13 DIAGNOSIS — M80052D Age-related osteoporosis with current pathological fracture, left femur, subsequent encounter for fracture with routine healing: Secondary | ICD-10-CM | POA: Diagnosis not present

## 2023-03-13 DIAGNOSIS — I480 Paroxysmal atrial fibrillation: Secondary | ICD-10-CM | POA: Diagnosis not present

## 2023-03-13 DIAGNOSIS — K5900 Constipation, unspecified: Secondary | ICD-10-CM | POA: Diagnosis not present

## 2023-03-13 DIAGNOSIS — F419 Anxiety disorder, unspecified: Secondary | ICD-10-CM | POA: Diagnosis not present

## 2023-03-13 DIAGNOSIS — E7800 Pure hypercholesterolemia, unspecified: Secondary | ICD-10-CM | POA: Diagnosis not present

## 2023-03-13 DIAGNOSIS — I10 Essential (primary) hypertension: Secondary | ICD-10-CM | POA: Diagnosis not present

## 2023-03-15 ENCOUNTER — Other Ambulatory Visit: Payer: Self-pay | Admitting: Internal Medicine

## 2023-03-16 ENCOUNTER — Ambulatory Visit (INDEPENDENT_AMBULATORY_CARE_PROVIDER_SITE_OTHER): Payer: Medicare PPO

## 2023-03-16 DIAGNOSIS — E538 Deficiency of other specified B group vitamins: Secondary | ICD-10-CM | POA: Diagnosis not present

## 2023-03-16 MED ORDER — CYANOCOBALAMIN 1000 MCG/ML IJ SOLN
1000.0000 ug | Freq: Once | INTRAMUSCULAR | Status: AC
Start: 2023-03-16 — End: 2023-03-16
  Administered 2023-03-16: 1000 ug via INTRAMUSCULAR

## 2023-03-16 NOTE — Progress Notes (Signed)
Pt presented for their vitamin B12 injection. Pt was identified through two identifiers. Pt tolerated shot well in their left  deltoid.  

## 2023-03-17 ENCOUNTER — Telehealth: Payer: Self-pay

## 2023-03-17 DIAGNOSIS — K5900 Constipation, unspecified: Secondary | ICD-10-CM | POA: Diagnosis not present

## 2023-03-17 DIAGNOSIS — M80052D Age-related osteoporosis with current pathological fracture, left femur, subsequent encounter for fracture with routine healing: Secondary | ICD-10-CM | POA: Diagnosis not present

## 2023-03-17 DIAGNOSIS — E7800 Pure hypercholesterolemia, unspecified: Secondary | ICD-10-CM | POA: Diagnosis not present

## 2023-03-17 DIAGNOSIS — I480 Paroxysmal atrial fibrillation: Secondary | ICD-10-CM | POA: Diagnosis not present

## 2023-03-17 DIAGNOSIS — N3010 Interstitial cystitis (chronic) without hematuria: Secondary | ICD-10-CM | POA: Diagnosis not present

## 2023-03-17 DIAGNOSIS — I2510 Atherosclerotic heart disease of native coronary artery without angina pectoris: Secondary | ICD-10-CM | POA: Diagnosis not present

## 2023-03-17 DIAGNOSIS — I10 Essential (primary) hypertension: Secondary | ICD-10-CM | POA: Diagnosis not present

## 2023-03-17 DIAGNOSIS — F419 Anxiety disorder, unspecified: Secondary | ICD-10-CM | POA: Diagnosis not present

## 2023-03-17 DIAGNOSIS — R251 Tremor, unspecified: Secondary | ICD-10-CM | POA: Diagnosis not present

## 2023-03-17 NOTE — Progress Notes (Signed)
Pt states that she will see her cardiologist on Friday, will find out then if she needs to continue medication. Advised to call back next week.  Penne Lash, RMA Care Guide [facility]  [place], [place] [address] Direct Dial: [phone] [email or web address]

## 2023-03-18 ENCOUNTER — Ambulatory Visit: Payer: Medicare PPO | Admitting: Urology

## 2023-03-19 ENCOUNTER — Ambulatory Visit: Payer: Medicare PPO | Admitting: Urology

## 2023-03-20 ENCOUNTER — Ambulatory Visit: Payer: Medicare PPO | Attending: Nurse Practitioner | Admitting: Nurse Practitioner

## 2023-03-20 ENCOUNTER — Encounter: Payer: Self-pay | Admitting: Nurse Practitioner

## 2023-03-20 VITALS — BP 126/58 | HR 61 | Ht 64.0 in | Wt 142.8 lb

## 2023-03-20 DIAGNOSIS — I2510 Atherosclerotic heart disease of native coronary artery without angina pectoris: Secondary | ICD-10-CM

## 2023-03-20 DIAGNOSIS — K5900 Constipation, unspecified: Secondary | ICD-10-CM | POA: Diagnosis not present

## 2023-03-20 DIAGNOSIS — E7800 Pure hypercholesterolemia, unspecified: Secondary | ICD-10-CM | POA: Diagnosis not present

## 2023-03-20 DIAGNOSIS — E782 Mixed hyperlipidemia: Secondary | ICD-10-CM | POA: Diagnosis not present

## 2023-03-20 DIAGNOSIS — I480 Paroxysmal atrial fibrillation: Secondary | ICD-10-CM

## 2023-03-20 DIAGNOSIS — N3010 Interstitial cystitis (chronic) without hematuria: Secondary | ICD-10-CM | POA: Diagnosis not present

## 2023-03-20 DIAGNOSIS — F419 Anxiety disorder, unspecified: Secondary | ICD-10-CM | POA: Diagnosis not present

## 2023-03-20 DIAGNOSIS — R072 Precordial pain: Secondary | ICD-10-CM | POA: Diagnosis not present

## 2023-03-20 DIAGNOSIS — I10 Essential (primary) hypertension: Secondary | ICD-10-CM | POA: Diagnosis not present

## 2023-03-20 DIAGNOSIS — M80052D Age-related osteoporosis with current pathological fracture, left femur, subsequent encounter for fracture with routine healing: Secondary | ICD-10-CM | POA: Diagnosis not present

## 2023-03-20 DIAGNOSIS — R251 Tremor, unspecified: Secondary | ICD-10-CM | POA: Diagnosis not present

## 2023-03-20 MED ORDER — CARVEDILOL 25 MG PO TABS: 25.0000 mg | ORAL_TABLET | Freq: Two times a day (BID) | ORAL | 1 refills | Status: DC

## 2023-03-20 MED ORDER — APIXABAN 5 MG PO TABS: 5.0000 mg | ORAL_TABLET | Freq: Two times a day (BID) | ORAL | 3 refills | Status: DC

## 2023-03-20 NOTE — Patient Instructions (Signed)
Medication Instructions:  No changes *If you need a refill on your cardiac medications before your next appointment, please call your pharmacy*   Lab Work: None ordered If you have labs (blood work) drawn today and your tests are completely normal, you will receive your results only by: MyChart Message (if you have MyChart) OR A paper copy in the mail If you have any lab test that is abnormal or we need to change your treatment, we will call you to review the results.   Testing/Procedures: None ordered   Follow-Up: At [facility], you and your health needs are our priority.  As part of our continuing mission to provide you with exceptional heart care, we have created designated Provider Care Teams.  These Care Teams include your primary Cardiologist (physician) and Advanced Practice Providers (APPs -  Physician Assistants and Nurse Practitioners) who all work together to provide you with the care you need, when you need it.  We recommend signing up for the patient portal called "MyChart".  Sign up information is provided on this After Visit Summary.  MyChart is used to connect with patients for Virtual Visits (Telemedicine).  Patients are able to view lab/test results, encounter notes, upcoming appointments, etc.  Non-urgent messages can be sent to your provider as well.   To learn more about what you can do with MyChart, go to [email or web address].    Your next appointment:   3 month(s)  Provider:   You may see Timothy Gollan, MD or one of the following Advanced Practice Providers on your designated Care Team:   Christopher Berge, NP Ryan Dunn, PA-C Cadence Furth, PA-C Sheri Hammock, NP    

## 2023-03-20 NOTE — Progress Notes (Signed)
Office Visit    Patient Name: Rachael Austin Date of Encounter: 03/20/2023  Primary Care Provider:  Dale [place], MD Primary Cardiologist:  Julien Nordmann, MD  Chief Complaint    82 y.o. y/o female with a history of PAF, hypertension, hyperlipidemia, syncope, aortic atherosclerosis, and chest pain, presents for follow-up related to PAF.  Past Medical History    Past Medical History:  Diagnosis Date   AK (actinic keratosis) 11/12/2022   left upper arm, tx'd with EDC   AK (actinic keratosis) 11/12/2022   right medial pretibia, LN2 11/25/22   Basal cell carcinoma 06/21/2020   left post shoulder sup, left mid chest, left lat thigh   Basal cell carcinoma 11/07/2021   L upper back, EDC   Basal cell carcinoma 11/07/2021   R upper back, EDC   Basal cell carcinoma 11/27/2021   right upper arm, EDC   Basal cell carcinoma 11/27/2021   right shoulder posterior, EDC   Basal cell carcinoma 11/27/2021   right anterior shoulder, EDC   Basal cell carcinoma (BCC) 06/13/2021   left dorsal foot, EDC 10/02/2021   Basal cell carcinoma (BCC) 06/13/2021   right postauricular tx'd w/ EDC   BCC (basal cell carcinoma of skin) 03/30/2007   R inf med pretibial - BCC   BCC (basal cell carcinoma of skin) 10/12/2013   L nasal ala - BCC   BCC (basal cell carcinoma of skin) 03/10/2018   L mid dorsum med forearm - superficial BCC    BCC (basal cell carcinoma) 10/02/2021   left dorsal foot proximal, EDC 10/02/2021   BCC (basal cell carcinoma) 10/02/2021   left mid back, superficial EDC 11/07/21   BCC (basal cell carcinoma) 10/02/2021   right pretibia   BCC (basal cell carcinoma) 10/02/2021   right mid back, superficial EDC 11/07/21   BCC (basal cell carcinoma) 08/14/2022   left distal calf, tx'd with EDC   BCC (basal cell carcinoma) 11/12/2022   superficial at left lateral pretibial,l tx'd with EDC   BCC (basal cell carcinoma) 11/12/2022   superficial at mid back right of midline, scheduled  for EDC   BCC (basal cell carcinoma) 11/12/2022   mid back right of midline, [facility] 11/25/22   Breast screening, unspecified 2013   Cancer (HCC) 1992   skin   Coronary artery calcification seen on CT scan    Degenerative disk disease    Diastolic dysfunction    a. 10/2021 Echo: EF 60-65%, no rwma, GrI DD, mild MR; b. 01/2023 Echo: EF 65-70%, no rwma, mild LVH, GrI DD Nl RV fxn. Triv MR. Mild-mod TR. AoV sclerosis.   GERD (gastroesophageal reflux disease)    History of basal cell carcinoma (BCC) 08/29/2020    left inferior knee lateral, , left inferior knee medial, right pretibia   History of SCC (squamous cell carcinoma) of skin 08/29/2020   right posterior calf, left lateral calf, and    Hypercholesterolemia 2008   Interstitial cystitis    followed by Dr Achilles Dunk   Near syncope    Other sign and symptom in breast 2013   Left upper outer quadrant breast "soreness" Ultrasound exam of right breast in the 2 o'clock position with the breast distracted medially showed a 0.3-0.4 with 0.5 cm simple cyst. In the 1 o'clock position where pt reported tenderness US exam was negatiive.  Because of her history of intermittent nipple drainage, ultrasound was completed of the retroareolar area.   PAF (paroxysmal atrial fibrillation) (HCC)  a. dx 01/2023-->Eliquis 5 BID (CHA2DS2VASc = 5).   Personal history of tobacco use, presenting hazards to health    PONV (postoperative nausea and vomiting)    Primary hypertension    SCC (squamous cell carcinoma) 03/28/2008   R dorsum hand - SCC   SCC (squamous cell carcinoma) 05/09/2009   R lat lower leg - SCCIS   SCC (squamous cell carcinoma) 05/09/2009   L lat lower leg - SCCIS   SCC (squamous cell carcinoma) 04/07/2013   L lower leg - SCCIS   SCC (squamous cell carcinoma) 04/27/2013   R forearm - SCC   SCC (squamous cell carcinoma) 04/28/2017   L lat knee - SCCIS   SCC (squamous cell carcinoma) 05/12/2018   L prox dorsum forearm - SCC   SCC (squamous cell  carcinoma) 06/13/2021   left forearm tx'd w/ EDC   SCC (squamous cell carcinoma), leg, right 03/30/2007   R sup pretibial - SCCIS   SCC (squamous cell carcinoma);BCC 10/12/2013   Mid back - superficial BCC with SCCIS    Special screening for malignant neoplasms, colon    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   left dorsal forearm, left lat calf   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   left lower leg superior, EDC at follow up   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   chest right of midline, tx'd with [facility]   Past Surgical History:  Procedure Laterality Date   ABDOMINAL HYSTERECTOMY  1992   APPENDECTOMY     CERVICAL DISCECTOMY     S/P C7-T1 discectomy with fusion   CHOLECYSTECTOMY  1990   COLONOSCOPY WITH PROPOFOL N/A 02/14/2016   Procedure: COLONOSCOPY WITH PROPOFOL;  Surgeon: Midge Minium, MD;  Location: [facility];  Service: Endoscopy;  Laterality: N/A;  PT WOULD LIKE 10 ARRIVAL TIME OR LATER   DILATION AND CURETTAGE OF UTERUS     ESOPHAGOGASTRODUODENOSCOPY (EGD) WITH PROPOFOL N/A 02/14/2016   Procedure: ESOPHAGOGASTRODUODENOSCOPY (EGD) WITH PROPOFOL with dialtion;  Surgeon: Midge Minium, MD;  Location: [facility];  Service: Endoscopy;  Laterality: N/A;   ESOPHAGOGASTRODUODENOSCOPY (EGD) WITH PROPOFOL N/A 04/13/2019   Procedure: ESOPHAGOGASTRODUODENOSCOPY (EGD) WITH PROPOFOL;  Surgeon: Toledo, Boykin Nearing, MD;  Location: [facility];  Service: Gastroenterology;  Laterality: N/A;   HIP PINNING,CANNULATED Left 02/02/2023   Procedure: PERCUTANEOUS FIXATION OF FEMORAL NECK;  Surgeon: Juanell Fairly, MD;  Location: [facility];  Service: Orthopedics;  Laterality: Left;   MELANOMA EXCISION     removed from Left calf 1994    Allergies  Allergies  Allergen Reactions   Augmentin [Amoxicillin-Pot Clavulanate] Swelling and Rash    Swelling of the lips   Lexapro [Escitalopram Oxalate]    Micardis [Telmisartan] Itching   Myrbetriq [Mirabegron]  Itching   Niacin And Related Itching   Codeine Sulfate Other (See Comments)    "Makes her hyper"   Vesicare [Solifenacin Succinate] Nausea And Vomiting and Rash    History of Present Illness      82 y.o. y/o female with a history of hypertension, hyperlipidemia, syncope, aortic atherosclerosis, chest pain, and skin cancer.  She previously established care in June 2022 in the setting of near-syncope and nonexertional chest pain.  ZIO monitoring was performed and showed sinus rhythm with 23 brief runs of SVT/atrial tachycardia, without triggered events.  She was last seen in cardiology clinic in January 2023 following ED visit for chest pain and hypertension with normal troponins.  At office visit, she reported a 3  to 35-month history of chest heaviness and dyspnea.  Echocardiogram was performed and showed an EF of 60 to 65% with grade 1 diastolic dysfunction, and mild MR.  She had no further symptoms following echo, therefor she deferred stress testing.  In May 2024, Ms. Buccellato was admitted with mechanical fall, hypertensive urgency, and left femoral neck fracture.  She underwent percutaneous fixation of the left femoral neck fracture and subsequently reported chest pain radiating into her back.  Echocardiogram was performed and again showed normal LV function with grade 1 diastolic dysfunction, trivial MR, mild-moderate TR, and aortic valve sclerosis.  CTA of the chest was performed and was negative for PE but showed multifocal pneumonia, severely dilated and fluid-filled esophagus with moderate to severe gastric distention concerning for gastric outlet obstruction, and coronary calcifications.  Troponin was minimally elevated at 18.  She was treated with antibiotics for multifocal pneumonia and acute hypoxic respiratory failure.  She did have an ileus which subsequently resolved.  In the setting of complex hospitalization, she developed A-fib with RVR and was placed on beta-blocker and Eliquis therapy.   She was discharged home in sinus rhythm on Feb 12, 2023.   Since discharge, she has done well.  She has been working with physical therapy and regaining strength.  She has not had any recurrent chest pain, dyspnea, palpitations, PND, orthopnea, dizziness, syncope, edema, or early satiety.  Family member present with her today.  Upon conversation about utility of anticoagulation in the setting of paroxysmal atrial fibrillation as well as cost options for anticoagulant therapy.  Home Medications    Current Outpatient Medications  Medication Sig Dispense Refill   acetaminophen (TYLENOL) 325 MG tablet Take 650 mg by mouth as needed.     apixaban (ELIQUIS) 5 MG TABS tablet Take 1 tablet (5 mg total) by mouth 2 (two) times daily. 60 tablet 3   carvedilol (COREG) 25 MG tablet Take 1 tablet (25 mg total) by mouth 2 (two) times daily with a meal. 180 tablet 1   esomeprazole (NEXIUM) 40 MG capsule Take 1 capsule by mouth once daily 90 capsule 3   hydrALAZINE (APRESOLINE) 25 MG tablet Take 1 tablet (25 mg total) by mouth 2 (two) times daily. 180 tablet 3   lovastatin (MEVACOR) 20 MG tablet Take 1 tablet (20 mg total) by mouth at bedtime. 90 tablet 1   sertraline (ZOLOFT) 50 MG tablet Take 1 tablet (50 mg total) by mouth daily. 90 tablet 1   trospium (SANCTURA) 20 MG tablet Take 1 tablet (20 mg total) by mouth daily. 90 tablet 3   No current facility-administered medications for this visit.     Review of Systems    She denies chest pain, palpitations, dyspnea, pnd, orthopnea, n, v, dizziness, syncope, edema, weight gain, or early satiety.  All other systems reviewed and are otherwise negative except as noted above.    Physical Exam    VS:  BP (!) 126/58 (BP Location: Left Arm, Patient Position: Sitting, Cuff Size: Normal)   Pulse 61   Ht 5\' 4"  (1.626 m)   Wt 142 lb 12.8 oz (64.8 kg)   LMP 11/20/1976   SpO2 98%   BMI 24.51 kg/m  , BMI Body mass index is 24.51 kg/m.     GEN: Well nourished,  well developed, in no acute distress. HEENT: normal. Neck: Supple, no JVD, carotid bruits, or masses. Cardiac: RRR, 2 of 6 stock ejection murmur at the upper sternal borders, no rubs or gallops.  No  clubbing, cyanosis, edema.  Radials 2+/PT 2+ and equal bilaterally.  Respiratory:  Respirations regular and unlabored, clear to auscultation bilaterally. GI: Soft, nontender, nondistended, BS + x 4. MS: no deformity or atrophy. Skin: warm and dry, no rash. Neuro:  Strength and sensation are intact. Psych: Normal affect.  Accessory Clinical Findings    ECG personally reviewed by me today -    Regular sinus rhythm, 61, right bundle branch block- no acute changes.  Lab Results  Component Value Date   WBC 5.2 03/05/2023   HGB 10.4 (L) 03/05/2023   HCT 32.0 (L) 03/05/2023   MCV 89.0 03/05/2023   PLT 211.0 03/05/2023   Lab Results  Component Value Date   CREATININE 0.96 03/05/2023   BUN 9 03/05/2023   NA 138 03/05/2023   K 4.2 03/05/2023   CL 104 03/05/2023   CO2 26 03/05/2023   Lab Results  Component Value Date   ALT 9 03/05/2023   AST 13 03/05/2023   ALKPHOS 99 03/05/2023   BILITOT 0.6 03/05/2023   Lab Results  Component Value Date   CHOL 140 03/05/2023   HDL 54.70 03/05/2023   LDLCALC 70 03/05/2023   TRIG 79.0 03/05/2023   CHOLHDL 3 03/05/2023    Lab Results  Component Value Date   HGBA1C 5.2 03/05/2023    Assessment & Plan    1.  Paroxysmal atrial fibrillation: Patient recently hospitalized for mechanical fall complicated by left femoral neck fracture requiring percutaneous fixation, chest pain, multifocal pneumonia, ileus, and paroxysmal atrial fibrillation with spontaneous conversion to sinus rhythm on diltiazem therapy.  She has since been taking oral carvedilol and tolerating well.  No known recurrence of atrial fibrillation or palpitations.  CHA2DS2-VASc equals 5 in the setting of hypertension, age x 2, vascular disease (coronary calcifications on CT), and female  gender.  We had a long discussion about the utility of oral anticoagulation in the setting of paroxysmal atrial fibrillation and options for therapy.  Patient is currently on Eliquis 5 mg twice daily.  She initially stated that she did not think she be able to afford it but then after further discussion, determined that it was likely to cost her 40 hours a month, which would be appropriate.  We will refill Eliquis and carvedilol today.  She is going to check with her pharmacist to ensure that it is affordable.  We discussed that if it ultimately is unaffordable, we could transition to warfarin.  2.  Precordial chest pain/coronary calcifications: Patient with prior history of chest pain in 2023 with recommendation for ischemic evaluation, but patient felt better and deferred.  During recent hospitalization, she had recurrent chest pain.  Troponin up to only 18 and otherwise normal.  Echo with normal LV function and without regional wall motion abnormalities.  She has not had any recurrent chest pain and denies dyspnea.  We discussed the potential role for ischemic evaluation given prior symptoms though at this time, she wishes to defer.  She remains on beta-blocker and statin therapy.  3.  Primary hypertension: Blood pressure elevated during hospitalization but stable today at 126/58.  Continue carvedilol and hydralazine (multiple other intolerances) therapy.  4.  Hyperlipidemia: LDL of 70 in June.  Continue lovastatin therapy.  5.  Disposition: Follow-up in clinic in 2 to 3 months or sooner if necessary.  Patient will contact us sooner if Eliquis is unaffordable.  Nicolasa Ducking, NP 03/20/2023, 4:15 PM

## 2023-03-23 ENCOUNTER — Ambulatory Visit (INDEPENDENT_AMBULATORY_CARE_PROVIDER_SITE_OTHER): Payer: Medicare PPO

## 2023-03-23 DIAGNOSIS — E538 Deficiency of other specified B group vitamins: Secondary | ICD-10-CM

## 2023-03-23 MED ORDER — CYANOCOBALAMIN 1000 MCG/ML IJ SOLN
1000.0000 ug | Freq: Once | INTRAMUSCULAR | Status: AC
Start: 2023-03-23 — End: 2023-03-23
  Administered 2023-03-23: 1000 ug via INTRAMUSCULAR

## 2023-03-23 NOTE — Progress Notes (Signed)
After obtaining consent, and per orders of  Margaret Arnett, NP, injection of B-12 given IM in right deltoid by ,  Lynn. Patient tolerated injection well.  

## 2023-03-24 DIAGNOSIS — R251 Tremor, unspecified: Secondary | ICD-10-CM | POA: Diagnosis not present

## 2023-03-24 DIAGNOSIS — E7800 Pure hypercholesterolemia, unspecified: Secondary | ICD-10-CM | POA: Diagnosis not present

## 2023-03-24 DIAGNOSIS — I2510 Atherosclerotic heart disease of native coronary artery without angina pectoris: Secondary | ICD-10-CM | POA: Diagnosis not present

## 2023-03-24 DIAGNOSIS — I480 Paroxysmal atrial fibrillation: Secondary | ICD-10-CM | POA: Diagnosis not present

## 2023-03-24 DIAGNOSIS — M80052D Age-related osteoporosis with current pathological fracture, left femur, subsequent encounter for fracture with routine healing: Secondary | ICD-10-CM | POA: Diagnosis not present

## 2023-03-24 DIAGNOSIS — F419 Anxiety disorder, unspecified: Secondary | ICD-10-CM | POA: Diagnosis not present

## 2023-03-24 DIAGNOSIS — K5900 Constipation, unspecified: Secondary | ICD-10-CM | POA: Diagnosis not present

## 2023-03-24 DIAGNOSIS — N3010 Interstitial cystitis (chronic) without hematuria: Secondary | ICD-10-CM | POA: Diagnosis not present

## 2023-03-24 DIAGNOSIS — I10 Essential (primary) hypertension: Secondary | ICD-10-CM | POA: Diagnosis not present

## 2023-03-24 NOTE — Progress Notes (Signed)
   Care Guide Note  03/24/2023 Name: Rachael Austin MRN: [id number] DOB: 1941/04/13  Referred by: Dale Marietta, MD Reason for referral : Care Coordination (Outreach to schedule with Pharm d )   Rachael Austin is a 82 y.o. year old female who is a primary care patient of Dale Adelanto, MD. Rachael Austin was referred to the pharmacist for assistance related to Atrial Fibrillation.    Successful contact was made with the patient to discuss pharmacy services. Patient declines engagement at this time. Contact information was provided to the patient should they wish to reach out for assistance at a later time.  Penne Lash, RMA Care Guide [facility]  [place], [place] [address] Direct Dial: [phone] [email or web address]

## 2023-03-25 DIAGNOSIS — S72002D Fracture of unspecified part of neck of left femur, subsequent encounter for closed fracture with routine healing: Secondary | ICD-10-CM | POA: Diagnosis not present

## 2023-03-30 ENCOUNTER — Telehealth: Payer: Self-pay | Admitting: Internal Medicine

## 2023-03-30 ENCOUNTER — Ambulatory Visit (INDEPENDENT_AMBULATORY_CARE_PROVIDER_SITE_OTHER): Payer: Medicare PPO

## 2023-03-30 DIAGNOSIS — I10 Essential (primary) hypertension: Secondary | ICD-10-CM

## 2023-03-30 DIAGNOSIS — D649 Anemia, unspecified: Secondary | ICD-10-CM

## 2023-03-30 DIAGNOSIS — E538 Deficiency of other specified B group vitamins: Secondary | ICD-10-CM

## 2023-03-30 MED ORDER — CYANOCOBALAMIN 1000 MCG/ML IJ SOLN
1000.0000 ug | Freq: Once | INTRAMUSCULAR | Status: AC
Start: 2023-03-30 — End: 2023-03-30
  Administered 2023-03-30: 1000 ug via INTRAMUSCULAR

## 2023-03-30 NOTE — Progress Notes (Signed)
Pt presented for their vitamin B12 injection. Pt was identified through two identifiers. Pt tolerated shot well in their left  deltoid.  

## 2023-03-30 NOTE — Telephone Encounter (Signed)
Orders placed for f/u labs.  

## 2023-03-30 NOTE — Telephone Encounter (Signed)
Patient is coming into office on 04/06/2023 for B12 and non fasting labs, no lab orders.

## 2023-03-31 DIAGNOSIS — M80052D Age-related osteoporosis with current pathological fracture, left femur, subsequent encounter for fracture with routine healing: Secondary | ICD-10-CM | POA: Diagnosis not present

## 2023-03-31 DIAGNOSIS — E7800 Pure hypercholesterolemia, unspecified: Secondary | ICD-10-CM | POA: Diagnosis not present

## 2023-03-31 DIAGNOSIS — R251 Tremor, unspecified: Secondary | ICD-10-CM | POA: Diagnosis not present

## 2023-03-31 DIAGNOSIS — N3010 Interstitial cystitis (chronic) without hematuria: Secondary | ICD-10-CM | POA: Diagnosis not present

## 2023-03-31 DIAGNOSIS — I10 Essential (primary) hypertension: Secondary | ICD-10-CM | POA: Diagnosis not present

## 2023-03-31 DIAGNOSIS — I480 Paroxysmal atrial fibrillation: Secondary | ICD-10-CM | POA: Diagnosis not present

## 2023-03-31 DIAGNOSIS — I2510 Atherosclerotic heart disease of native coronary artery without angina pectoris: Secondary | ICD-10-CM | POA: Diagnosis not present

## 2023-03-31 DIAGNOSIS — K5900 Constipation, unspecified: Secondary | ICD-10-CM | POA: Diagnosis not present

## 2023-03-31 DIAGNOSIS — F419 Anxiety disorder, unspecified: Secondary | ICD-10-CM | POA: Diagnosis not present

## 2023-04-03 DIAGNOSIS — S62653A Nondisplaced fracture of medial phalanx of left middle finger, initial encounter for closed fracture: Secondary | ICD-10-CM | POA: Diagnosis not present

## 2023-04-03 DIAGNOSIS — S52502A Unspecified fracture of the lower end of left radius, initial encounter for closed fracture: Secondary | ICD-10-CM | POA: Diagnosis not present

## 2023-04-06 ENCOUNTER — Ambulatory Visit (INDEPENDENT_AMBULATORY_CARE_PROVIDER_SITE_OTHER): Payer: Medicare PPO

## 2023-04-06 ENCOUNTER — Ambulatory Visit: Payer: Medicare PPO

## 2023-04-06 DIAGNOSIS — E538 Deficiency of other specified B group vitamins: Secondary | ICD-10-CM | POA: Diagnosis not present

## 2023-04-06 DIAGNOSIS — I10 Essential (primary) hypertension: Secondary | ICD-10-CM | POA: Diagnosis not present

## 2023-04-06 DIAGNOSIS — D649 Anemia, unspecified: Secondary | ICD-10-CM | POA: Diagnosis not present

## 2023-04-06 MED ORDER — CYANOCOBALAMIN 1000 MCG/ML IJ SOLN
1000.00 ug | Freq: Once | INTRAMUSCULAR | Status: AC
Start: 2023-04-06 — End: 2023-04-06
  Administered 2023-04-06: 1000 ug via INTRAMUSCULAR

## 2023-04-06 NOTE — Progress Notes (Deleted)
Pt presented for their vitamin B12 injection. Pt was identified through two identifiers. Pt tolerated shot well in their left or right deltoid.  

## 2023-04-06 NOTE — Progress Notes (Signed)
Pt presented for their vitamin B12 injection. Pt was identified through two identifiers. Pt tolerated shot well in their right deltoid.  

## 2023-04-07 LAB — IBC + FERRITIN
Ferritin: 15.7 ng/mL (ref 10.0–291.0)
Iron: 32 ug/dL — ABNORMAL LOW (ref 42–145)
Saturation Ratios: 8.2 % — ABNORMAL LOW (ref 20.0–50.0)
TIBC: 389.2 ug/dL (ref 250.0–450.0)
Transferrin: 278 mg/dL (ref 212.0–360.0)

## 2023-04-07 LAB — BASIC METABOLIC PANEL
BUN: 7 mg/dL (ref 6–23)
CO2: 27 mEq/L (ref 19–32)
Calcium: 8.7 mg/dL (ref 8.4–10.5)
Chloride: 98 mEq/L (ref 96–112)
Creatinine, Ser: 0.96 mg/dL (ref 0.40–1.20)
GFR: 54.82 mL/min — ABNORMAL LOW (ref >60.00)
Glucose, Bld: 117 mg/dL — ABNORMAL HIGH (ref 70–99)
Potassium: 4.7 mEq/L (ref 3.5–5.1)
Sodium: 134 mEq/L — ABNORMAL LOW (ref 135–145)

## 2023-04-07 LAB — CBC WITH DIFFERENTIAL/PLATELET
Basophils Absolute: 0.1 10*3/uL (ref 0.0–0.1)
Basophils Relative: 1 % (ref 0.0–3.0)
Eosinophils Absolute: 0.1 10*3/uL (ref 0.0–0.7)
Eosinophils Relative: 1.4 % (ref 0.0–5.0)
HCT: 29.3 % — ABNORMAL LOW (ref 36.0–46.0)
Hemoglobin: 9.6 g/dL — ABNORMAL LOW (ref 12.0–15.0)
Lymphocytes Relative: 34.8 % (ref 12.0–46.0)
Lymphs Abs: 1.9 10*3/uL (ref 0.7–4.0)
MCHC: 32.6 g/dL (ref 30.0–36.0)
MCV: 86 fl (ref 78.0–100.0)
Monocytes Absolute: 0.6 10*3/uL (ref 0.1–1.0)
Monocytes Relative: 11 % (ref 3.0–12.0)
Neutro Abs: 2.8 10*3/uL (ref 1.4–7.7)
Neutrophils Relative %: 51.8 % (ref 43.0–77.0)
Platelets: 213 10*3/uL (ref 150.0–400.0)
RBC: 3.41 Mil/uL — ABNORMAL LOW (ref 3.87–5.11)
RDW: 14.5 % (ref 11.5–15.5)
WBC: 5.5 10*3/uL (ref 4.0–10.5)

## 2023-04-08 ENCOUNTER — Other Ambulatory Visit: Payer: Medicare PPO

## 2023-04-09 DIAGNOSIS — M80052D Age-related osteoporosis with current pathological fracture, left femur, subsequent encounter for fracture with routine healing: Secondary | ICD-10-CM | POA: Diagnosis not present

## 2023-04-09 DIAGNOSIS — K5900 Constipation, unspecified: Secondary | ICD-10-CM | POA: Diagnosis not present

## 2023-04-09 DIAGNOSIS — I10 Essential (primary) hypertension: Secondary | ICD-10-CM | POA: Diagnosis not present

## 2023-04-09 DIAGNOSIS — R251 Tremor, unspecified: Secondary | ICD-10-CM | POA: Diagnosis not present

## 2023-04-09 DIAGNOSIS — N3010 Interstitial cystitis (chronic) without hematuria: Secondary | ICD-10-CM | POA: Diagnosis not present

## 2023-04-09 DIAGNOSIS — F419 Anxiety disorder, unspecified: Secondary | ICD-10-CM | POA: Diagnosis not present

## 2023-04-09 DIAGNOSIS — E7800 Pure hypercholesterolemia, unspecified: Secondary | ICD-10-CM | POA: Diagnosis not present

## 2023-04-09 DIAGNOSIS — I480 Paroxysmal atrial fibrillation: Secondary | ICD-10-CM | POA: Diagnosis not present

## 2023-04-09 DIAGNOSIS — I2510 Atherosclerotic heart disease of native coronary artery without angina pectoris: Secondary | ICD-10-CM | POA: Diagnosis not present

## 2023-04-10 DIAGNOSIS — S62653A Nondisplaced fracture of medial phalanx of left middle finger, initial encounter for closed fracture: Secondary | ICD-10-CM | POA: Diagnosis not present

## 2023-04-10 DIAGNOSIS — S52502D Unspecified fracture of the lower end of left radius, subsequent encounter for closed fracture with routine healing: Secondary | ICD-10-CM | POA: Diagnosis not present

## 2023-04-14 DIAGNOSIS — R251 Tremor, unspecified: Secondary | ICD-10-CM | POA: Diagnosis not present

## 2023-04-14 DIAGNOSIS — K5900 Constipation, unspecified: Secondary | ICD-10-CM | POA: Diagnosis not present

## 2023-04-14 DIAGNOSIS — I2510 Atherosclerotic heart disease of native coronary artery without angina pectoris: Secondary | ICD-10-CM | POA: Diagnosis not present

## 2023-04-14 DIAGNOSIS — N3010 Interstitial cystitis (chronic) without hematuria: Secondary | ICD-10-CM | POA: Diagnosis not present

## 2023-04-14 DIAGNOSIS — I480 Paroxysmal atrial fibrillation: Secondary | ICD-10-CM | POA: Diagnosis not present

## 2023-04-14 DIAGNOSIS — M80052D Age-related osteoporosis with current pathological fracture, left femur, subsequent encounter for fracture with routine healing: Secondary | ICD-10-CM | POA: Diagnosis not present

## 2023-04-14 DIAGNOSIS — I10 Essential (primary) hypertension: Secondary | ICD-10-CM | POA: Diagnosis not present

## 2023-04-14 DIAGNOSIS — F419 Anxiety disorder, unspecified: Secondary | ICD-10-CM | POA: Diagnosis not present

## 2023-04-14 DIAGNOSIS — E7800 Pure hypercholesterolemia, unspecified: Secondary | ICD-10-CM | POA: Diagnosis not present

## 2023-04-21 ENCOUNTER — Telehealth: Payer: Self-pay | Admitting: Internal Medicine

## 2023-04-21 NOTE — Telephone Encounter (Signed)
Reviewed message and reviewed recent cardiology note.  No mention of increased swelling.  Given increased swelling, etc - needs to be evaluated.

## 2023-04-21 NOTE — Telephone Encounter (Signed)
Pt called in asking to speck to [facility] LPN to let her know what she was in the hospital a couple weeks ago from a hip injury and now she feels like that injury moved to her legs, and now they  are swollen and red, and she wants to know what she should do?

## 2023-04-21 NOTE — Telephone Encounter (Signed)
Called patient to clarify. Patient says that when she was here last time for a visit, her feet and legs up to about halfway up her calf were swollen and red. This has been persistent. No increased pain, warmth, etc. Pt is not SOB. Denies any other acute symptoms. She says that when she gets up in the mornings and if she sits with her feet elevated, swelling goes down. She does not have a scale to weigh on. Patient says she just wanted to make sure this isn't something that she needs to be doing something about since she has so much going on. Advised patient to continue to elevate her legs as she has been doing. She is not expecting a call back today.

## 2023-04-22 DIAGNOSIS — M80052D Age-related osteoporosis with current pathological fracture, left femur, subsequent encounter for fracture with routine healing: Secondary | ICD-10-CM | POA: Diagnosis not present

## 2023-04-22 DIAGNOSIS — R251 Tremor, unspecified: Secondary | ICD-10-CM | POA: Diagnosis not present

## 2023-04-22 DIAGNOSIS — N3010 Interstitial cystitis (chronic) without hematuria: Secondary | ICD-10-CM | POA: Diagnosis not present

## 2023-04-22 DIAGNOSIS — I2510 Atherosclerotic heart disease of native coronary artery without angina pectoris: Secondary | ICD-10-CM | POA: Diagnosis not present

## 2023-04-22 DIAGNOSIS — K5900 Constipation, unspecified: Secondary | ICD-10-CM | POA: Diagnosis not present

## 2023-04-22 DIAGNOSIS — I480 Paroxysmal atrial fibrillation: Secondary | ICD-10-CM | POA: Diagnosis not present

## 2023-04-22 DIAGNOSIS — F419 Anxiety disorder, unspecified: Secondary | ICD-10-CM | POA: Diagnosis not present

## 2023-04-22 DIAGNOSIS — E7800 Pure hypercholesterolemia, unspecified: Secondary | ICD-10-CM | POA: Diagnosis not present

## 2023-04-22 DIAGNOSIS — I10 Essential (primary) hypertension: Secondary | ICD-10-CM | POA: Diagnosis not present

## 2023-04-22 NOTE — Telephone Encounter (Signed)
Pt scheduled,

## 2023-04-22 NOTE — Telephone Encounter (Signed)
Yes - can work her in.

## 2023-04-22 NOTE — Telephone Encounter (Signed)
Work in Friday

## 2023-04-24 ENCOUNTER — Ambulatory Visit: Payer: Medicare PPO | Admitting: Internal Medicine

## 2023-04-24 DIAGNOSIS — I480 Paroxysmal atrial fibrillation: Secondary | ICD-10-CM | POA: Diagnosis not present

## 2023-04-24 DIAGNOSIS — K219 Gastro-esophageal reflux disease without esophagitis: Secondary | ICD-10-CM

## 2023-04-24 DIAGNOSIS — J189 Pneumonia, unspecified organism: Secondary | ICD-10-CM | POA: Diagnosis not present

## 2023-04-24 DIAGNOSIS — E7800 Pure hypercholesterolemia, unspecified: Secondary | ICD-10-CM

## 2023-04-24 DIAGNOSIS — R739 Hyperglycemia, unspecified: Secondary | ICD-10-CM

## 2023-04-24 DIAGNOSIS — I10 Essential (primary) hypertension: Secondary | ICD-10-CM

## 2023-04-24 DIAGNOSIS — M7989 Other specified soft tissue disorders: Secondary | ICD-10-CM

## 2023-04-24 NOTE — Progress Notes (Unsigned)
Subjective:    Patient ID: MARALYN FASEL, female    DOB: 12-17-1940, 82 y.o.   MRN: [id number]  Patient here for  Chief Complaint  Patient presents with   Leg Swelling    HPI Here for work in appt. Work in for feet and lower leg swelling.  Recently admitted 02/01/23 - with left femur fracture.  Is S/p percutaneous fixation of left femoral neck hip fracture on 02/02/2023.  Continues f/u with ortho.  Also found to have multifocal pneumonia.  Treated with abx. Needs f/u cxr.  Did have afib with RVR.  Echo showed EF estimated at 65 to 70%, mild LVH, grade 1 diastolic dysfunction, mild to moderate TR.  Discharged on coreg and eliquis. F/u with cardioogy 03/20/23 - stable. Saw ortho 7//24 - left distal radius fracture and nondisplaced fracture of the middle phalanx. Had f/u 04/10/23 - placed in short arm cast. She reports she is doing relatively well.  Breathing stable.  No chest pain.  No abdominal pain or bowel change reported. Reports some swelling - pedal/ankle swelling.  Is some better now.     Past Medical History:  Diagnosis Date   AK (actinic keratosis) 11/12/2022   left upper arm, tx'd with EDC   AK (actinic keratosis) 11/12/2022   right medial pretibia, LN2 11/25/22   Basal cell carcinoma 06/21/2020   left post shoulder sup, left mid chest, left lat thigh   Basal cell carcinoma 11/07/2021   L upper back, EDC   Basal cell carcinoma 11/07/2021   R upper back, EDC   Basal cell carcinoma 11/27/2021   right upper arm, EDC   Basal cell carcinoma 11/27/2021   right shoulder posterior, EDC   Basal cell carcinoma 11/27/2021   right anterior shoulder, EDC   Basal cell carcinoma (BCC) 06/13/2021   left dorsal foot, EDC 10/02/2021   Basal cell carcinoma (BCC) 06/13/2021   right postauricular tx'd w/ EDC   BCC (basal cell carcinoma of skin) 03/30/2007   R inf med pretibial - BCC   BCC (basal cell carcinoma of skin) 10/12/2013   L nasal ala - BCC   BCC (basal cell carcinoma of skin)  03/10/2018   L mid dorsum med forearm - superficial BCC    BCC (basal cell carcinoma) 10/02/2021   left dorsal foot proximal, EDC 10/02/2021   BCC (basal cell carcinoma) 10/02/2021   left mid back, superficial EDC 11/07/21   BCC (basal cell carcinoma) 10/02/2021   right pretibia   BCC (basal cell carcinoma) 10/02/2021   right mid back, superficial EDC 11/07/21   BCC (basal cell carcinoma) 08/14/2022   left distal calf, tx'd with EDC   BCC (basal cell carcinoma) 11/12/2022   superficial at left lateral pretibial,l tx'd with EDC   BCC (basal cell carcinoma) 11/12/2022   superficial at mid back right of midline, scheduled for EDC   BCC (basal cell carcinoma) 11/12/2022   mid back right of midline, [facility] 11/25/22   Breast screening, unspecified 2013   Cancer (HCC) 1992   skin   Coronary artery calcification seen on CT scan    Degenerative disk disease    Diastolic dysfunction    a. 10/2021 Echo: EF 60-65%, no rwma, GrI DD, mild MR; b. 01/2023 Echo: EF 65-70%, no rwma, mild LVH, GrI DD Nl RV fxn. Triv MR. Mild-mod TR. AoV sclerosis.   GERD (gastroesophageal reflux disease)    History of basal cell carcinoma (BCC) 08/29/2020    left inferior knee lateral, ,  left inferior knee medial, right pretibia   History of SCC (squamous cell carcinoma) of skin 08/29/2020   right posterior calf, left lateral calf, and    Hypercholesterolemia 2008   Interstitial cystitis    followed by Dr Achilles Dunk   Near syncope    Other sign and symptom in breast 2013   Left upper outer quadrant breast "soreness" Ultrasound exam of right breast in the 2 o'clock position with the breast distracted medially showed a 0.3-0.4 with 0.5 cm simple cyst. In the 1 o'clock position where pt reported tenderness US exam was negatiive.  Because of her history of intermittent nipple drainage, ultrasound was completed of the retroareolar area.   PAF (paroxysmal atrial fibrillation) (HCC)    a. dx 01/2023-->Eliquis 5 BID (CHA2DS2VASc = 5).    Personal history of tobacco use, presenting hazards to health    PONV (postoperative nausea and vomiting)    Primary hypertension    SCC (squamous cell carcinoma) 03/28/2008   R dorsum hand - SCC   SCC (squamous cell carcinoma) 05/09/2009   R lat lower leg - SCCIS   SCC (squamous cell carcinoma) 05/09/2009   L lat lower leg - SCCIS   SCC (squamous cell carcinoma) 04/07/2013   L lower leg - SCCIS   SCC (squamous cell carcinoma) 04/27/2013   R forearm - SCC   SCC (squamous cell carcinoma) 04/28/2017   L lat knee - SCCIS   SCC (squamous cell carcinoma) 05/12/2018   L prox dorsum forearm - SCC   SCC (squamous cell carcinoma) 06/13/2021   left forearm tx'd w/ EDC   SCC (squamous cell carcinoma), leg, right 03/30/2007   R sup pretibial - SCCIS   SCC (squamous cell carcinoma);BCC 10/12/2013   Mid back - superficial BCC with SCCIS    Special screening for malignant neoplasms, colon    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   left dorsal forearm, left lat calf   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   left lower leg superior, EDC at follow up   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   chest right of midline, tx'd with [facility]   Past Surgical History:  Procedure Laterality Date   ABDOMINAL HYSTERECTOMY  1992   APPENDECTOMY     CERVICAL DISCECTOMY     S/P C7-T1 discectomy with fusion   CHOLECYSTECTOMY  1990   COLONOSCOPY WITH PROPOFOL N/A 02/14/2016   Procedure: COLONOSCOPY WITH PROPOFOL;  Surgeon: Midge Minium, MD;  Location: [facility];  Service: Endoscopy;  Laterality: N/A;  PT WOULD LIKE 10 ARRIVAL TIME OR LATER   DILATION AND CURETTAGE OF UTERUS     ESOPHAGOGASTRODUODENOSCOPY (EGD) WITH PROPOFOL N/A 02/14/2016   Procedure: ESOPHAGOGASTRODUODENOSCOPY (EGD) WITH PROPOFOL with dialtion;  Surgeon: Midge Minium, MD;  Location: [facility];  Service: Endoscopy;  Laterality: N/A;   ESOPHAGOGASTRODUODENOSCOPY (EGD) WITH PROPOFOL N/A 04/13/2019    Procedure: ESOPHAGOGASTRODUODENOSCOPY (EGD) WITH PROPOFOL;  Surgeon: Toledo, Boykin Nearing, MD;  Location: [facility];  Service: Gastroenterology;  Laterality: N/A;   HIP PINNING,CANNULATED Left 02/02/2023   Procedure: PERCUTANEOUS FIXATION OF FEMORAL NECK;  Surgeon: Juanell Fairly, MD;  Location: [facility];  Service: Orthopedics;  Laterality: Left;   MELANOMA EXCISION     removed from Left calf 1994   Family History  Problem Relation Age of Onset   Hodgkin's lymphoma Mother    Heart failure Father    Heart attack Father    Arthritis Sister  Three sisters w/ degeneratve disk disease   Headache Sister        Two sisters hx of headache   Breast cancer Neg Hx    Colon cancer Neg Hx    Bladder Cancer Neg Hx    Kidney cancer Neg Hx    Social History   Socioeconomic History   Marital status: Widowed    Spouse name: Not on file   Number of children: 2   Years of education: Not on file   Highest education level: Not on file  Occupational History   Not on file  Tobacco Use   Smoking status: Former    Current packs/day: 1.00    Average packs/day: 1 pack/day for 15.0 years (15.0 ttl pk-yrs)    Types: Cigarettes   Smokeless tobacco: Never  Vaping Use   Vaping status: Never Used  Substance and Sexual Activity   Alcohol use: No    Alcohol/week: 0.0 standard drinks of alcohol   Drug use: No   Sexual activity: Not on file  Other Topics Concern   Not on file  Social History Narrative   Married and has 2 children, daughters.   Social Determinants of Health   Financial Resource Strain: Not on file  Food Insecurity: No Food Insecurity (02/11/2023)   Hunger Vital Sign    Worried About Running Out of Food in the Last Year: Never true    Ran Out of Food in the Last Year: Never true  Transportation Needs: No Transportation Needs (02/11/2023)   PRAPARE - Administrator, Civil Service (Medical): No    Lack of Transportation (Non-Medical): No  Physical Activity: Not on  file  Stress: Not on file  Social Connections: Not on file     Review of Systems  Constitutional:  Negative for appetite change and unexpected weight change.  HENT:  Negative for congestion and sinus pressure.   Respiratory:  Negative for cough, chest tightness and shortness of breath.   Cardiovascular:  Negative for chest pain and palpitations.       Pedal and ankle swelling.   Gastrointestinal:  Negative for abdominal pain, diarrhea, nausea and vomiting.  Genitourinary:  Negative for difficulty urinating and dysuria.  Musculoskeletal:  Negative for joint swelling and myalgias.  Skin:  Negative for color change and rash.  Neurological:  Negative for dizziness and headaches.  Psychiatric/Behavioral:  Negative for agitation and dysphoric mood.        Objective:     BP 130/70   Pulse 74   Temp 97.9 F (36.6 C)   Resp 16   Ht 5\' 4"  (1.626 m)   Wt 143 lb 9.6 oz (65.1 kg)   LMP 11/20/1976   SpO2 99%   BMI 24.65 kg/m  Wt Readings from Last 3 Encounters:  04/24/23 143 lb 9.6 oz (65.1 kg)  03/20/23 142 lb 12.8 oz (64.8 kg)  03/05/23 138 lb 12.8 oz (63 kg)    Physical Exam Vitals reviewed.  Constitutional:      General: She is not in acute distress.    Appearance: Normal appearance.  HENT:     Head: Normocephalic and atraumatic.     Right Ear: External ear normal.     Left Ear: External ear normal.  Eyes:     General: No scleral icterus.       Right eye: No discharge.        Left eye: No discharge.     Conjunctiva/sclera: Conjunctivae normal.  Neck:  Thyroid: No thyromegaly.  Cardiovascular:     Rate and Rhythm: Normal rate and regular rhythm.  Pulmonary:     Effort: No respiratory distress.     Breath sounds: Normal breath sounds. No wheezing.  Abdominal:     General: Bowel sounds are normal.     Palpations: Abdomen is soft.     Tenderness: There is no abdominal tenderness.  Musculoskeletal:        General: No tenderness.     Cervical back: Neck supple.  No tenderness.     Comments: Pedal and ankle swelling - stasis changes.  DP pulses palpable and equal bilateral.   Lymphadenopathy:     Cervical: No cervical adenopathy.  Skin:    Findings: No erythema or rash.  Neurological:     Mental Status: She is alert.  Psychiatric:        Mood and Affect: Mood normal.        Behavior: Behavior normal.      Outpatient Encounter Medications as of 04/24/2023  Medication Sig   acetaminophen (TYLENOL) 325 MG tablet Take 650 mg by mouth as needed.   apixaban (ELIQUIS) 5 MG TABS tablet Take 1 tablet (5 mg total) by mouth 2 (two) times daily.   carvedilol (COREG) 25 MG tablet Take 1 tablet (25 mg total) by mouth 2 (two) times daily with a meal.   esomeprazole (NEXIUM) 40 MG capsule Take 1 capsule by mouth once daily   hydrALAZINE (APRESOLINE) 25 MG tablet Take 1 tablet (25 mg total) by mouth 2 (two) times daily.   lovastatin (MEVACOR) 20 MG tablet Take 1 tablet (20 mg total) by mouth at bedtime.   sertraline (ZOLOFT) 50 MG tablet Take 1 tablet (50 mg total) by mouth daily.   trospium (SANCTURA) 20 MG tablet Take 1 tablet (20 mg total) by mouth daily.   No facility-administered encounter medications on file as of 04/24/2023.     Lab Results  Component Value Date   WBC 5.5 04/06/2023   HGB 9.6 (L) 04/06/2023   HCT 29.3 (L) 04/06/2023   PLT 213.0 04/06/2023   GLUCOSE 117 (H) 04/06/2023   CHOL 140 03/05/2023   TRIG 79.0 03/05/2023   HDL 54.70 03/05/2023   LDLCALC 70 03/05/2023   ALT 9 03/05/2023   AST 13 03/05/2023   NA 134 (L) 04/06/2023   K 4.7 04/06/2023   CL 98 04/06/2023   CREATININE 0.96 04/06/2023   BUN 7 04/06/2023   CO2 27 04/06/2023   TSH 4.50 11/04/2022   HGBA1C 5.2 03/05/2023    DG ABD ACUTE 2+V W 1V CHEST  Result Date: 02/04/2023 CLINICAL DATA:  Gastric outlet obstruction. EXAM: DG ABDOMEN ACUTE WITH 1 VIEW CHEST COMPARISON:  Feb 01, 2023. FINDINGS: Mildly dilated small bowel loops are noted concerning for distal small bowel  obstruction or ileus. Residual contrast is noted in urinary bladder. No radiopaque calculi or other significant radiographic abnormality is seen. Heart size and mediastinal contours are within normal limits. Mild bilateral perihilar opacities are noted concerning for pulmonary edema or inflammation, right greater than left. IMPRESSION: Dilated small bowel loops are noted concerning for distal small bowel obstruction or ileus. Bilateral lung opacities are noted, right greater than left, concerning for pulmonary edema or inflammation. Electronically Signed   By: Lupita Raider M.D.   On: 02/04/2023 12:55   ECHOCARDIOGRAM COMPLETE  Result Date: 02/04/2023    ECHOCARDIOGRAM REPORT   Patient Name:   MINELA ALKHAFAJI [facility] Date of Exam: 02/03/2023  Medical Rec #:  [id number]          Height:       64.0 in Accession #:    [id number]         Weight:       142.0 lb Date of Birth:  09/27/1941          BSA:          1.691 m Patient Age:    82 years           BP:           159/63 mmHg Patient Gender: F                  HR:           115 bpm. Exam Location:  [facility] Procedure: 2D Echo, Cardiac Doppler and Color Doppler Indications:     R07.9 Chest pain  History:         Patient has prior history of Echocardiogram examinations, most                  recent 11/21/2021.  Sonographer:     Daphine Deutscher RDCS Referring Phys:  [id number] [address]RI[address] END Diagnosing Phys: Yvonne Kendall MD IMPRESSIONS  1. Left ventricular ejection fraction, by estimation, is 65 to 70%. The left ventricle has normal function. The left ventricle has no regional wall motion abnormalities. There is mild left ventricular hypertrophy. Left ventricular diastolic parameters are consistent with Grade I diastolic dysfunction (impaired relaxation). Elevated left atrial pressure.  2. Right ventricular systolic function is mildly reduced. The right ventricular size is normal. There is normal pulmonary artery systolic pressure.  3. The mitral valve is normal in structure.  Trivial mitral valve regurgitation. No evidence of mitral stenosis.  4. Tricuspid valve regurgitation is mild to moderate.  5. The aortic valve has an indeterminant number of cusps. There is mild calcification of the aortic valve. There is mild thickening of the aortic valve. Aortic valve regurgitation is not visualized. Aortic valve sclerosis/calcification is present, without any evidence of aortic stenosis.  6. The inferior vena cava is normal in size with <50% respiratory variability, suggesting right atrial pressure of 8 mmHg. FINDINGS  Left Ventricle: Left ventricular ejection fraction, by estimation, is 65 to 70%. The left ventricle has normal function. The left ventricle has no regional wall motion abnormalities. The left ventricular internal cavity size was normal in size. There is  mild left ventricular hypertrophy. Left ventricular diastolic parameters are consistent with Grade I diastolic dysfunction (impaired relaxation). Elevated left atrial pressure. Right Ventricle: The right ventricular size is normal. No increase in right ventricular wall thickness. Right ventricular systolic function is mildly reduced. There is normal pulmonary artery systolic pressure. The tricuspid regurgitant velocity is 2.50 m/s, and with an assumed right atrial pressure of 8 mmHg, the estimated right ventricular systolic pressure is 33.0 mmHg. Left Atrium: Left atrial size was normal in size. Right Atrium: Right atrial size was normal in size. Pericardium: There is no evidence of pericardial effusion. Mitral Valve: The mitral valve is normal in structure. Trivial mitral valve regurgitation. No evidence of mitral valve stenosis. Tricuspid Valve: The tricuspid valve is normal in structure. Tricuspid valve regurgitation is mild to moderate. Aortic Valve: The aortic valve has an indeterminant number of cusps. There is mild calcification of the aortic valve. There is mild thickening of the aortic valve. Aortic valve regurgitation  is not visualized. Aortic valve sclerosis/calcification is present, without any evidence of  aortic stenosis. Aortic valve mean gradient measures 9.0 mmHg. Aortic valve peak gradient measures 11.4 mmHg. Aortic valve area, by VTI measures 2.05 cm. Pulmonic Valve: The pulmonic valve was normal in structure. Pulmonic valve regurgitation is trivial. No evidence of pulmonic stenosis. Aorta: The aortic root is normal in size and structure. Pulmonary Artery: The pulmonary artery is of normal size. Venous: The inferior vena cava is normal in size with less than 50% respiratory variability, suggesting right atrial pressure of 8 mmHg. IAS/Shunts: The interatrial septum was not well visualized.  LEFT VENTRICLE PLAX 2D LVIDd:         3.40 cm   Diastology LVIDs:         2.30 cm   LV e' medial:    6.20 cm/s LV PW:         1.20 cm   LV E/e' medial:  17.3 LV IVS:        1.20 cm   LV e' lateral:   6.05 cm/s LVOT diam:     1.90 cm   LV E/e' lateral: 17.7 LV SV:         53 LV SV Index:   31 LVOT Area:     2.84 cm  RIGHT VENTRICLE             IVC RV Basal diam:  3.30 cm     IVC diam: 1.60 cm RV S prime:     10.22 cm/s LEFT ATRIUM             Index        RIGHT ATRIUM           Index LA diam:        4.20 cm 2.48 cm/m   RA Area:     10.90 cm LA Vol (A2C):   41.3 ml 24.42 ml/m  RA Volume:   25.70 ml  15.19 ml/m LA Vol (A4C):   36.5 ml 21.58 ml/m LA Biplane Vol: 39.2 ml 23.18 ml/m  AORTIC VALVE AV Area (Vmax):    1.83 cm AV Area (Vmean):   1.80 cm AV Area (VTI):     2.05 cm AV Vmax:           168.78 cm/s AV Vmean:          118.474 cm/s AV VTI:            0.259 m AV Peak Grad:      11.4 mmHg AV Mean Grad:      9.0 mmHg LVOT Vmax:         108.75 cm/s LVOT Vmean:        75.350 cm/s LVOT VTI:          0.188 m LVOT/AV VTI ratio: 0.72  AORTA Ao Root diam: 3.30 cm Ao Asc diam:  2.90 cm MITRAL VALVE                TRICUSPID VALVE MV Area (PHT): 2.93 cm     TR Peak grad:   25.0 mmHg MV Decel Time: 259 msec     TR Vmax:        250.00 cm/s  MV E velocity: 107.14 cm/s MV A velocity: 144.50 cm/s  SHUNTS MV E/A ratio:  0.74         Systemic VTI:  0.19 m                             Systemic Diam:  1.90 cm Yvonne Kendall MD Electronically signed by Yvonne Kendall MD Signature Date/Time: 02/04/2023/7:35:40 AM    Final    CT Angio Chest Pulmonary Embolism (PE) W or WO Contrast  Result Date: 02/03/2023 CLINICAL DATA:  High probability of pulmonary embolus. EXAM: CT ANGIOGRAPHY CHEST WITH CONTRAST TECHNIQUE: Multidetector CT imaging of the chest was performed using the standard protocol during bolus administration of intravenous contrast. Multiplanar CT image reconstructions and MIPs were obtained to evaluate the vascular anatomy. RADIATION DOSE REDUCTION: This exam was performed according to the departmental dose-optimization program which includes automated exposure control, adjustment of the mA and/or kV according to patient size and/or use of iterative reconstruction technique. CONTRAST:  60mL OMNIPAQUE IOHEXOL 350 MG/ML SOLN COMPARISON:  None Available. FINDINGS: Cardiovascular: Satisfactory opacification of the pulmonary arteries to the segmental level. No evidence of pulmonary embolism. Normal heart size. No pericardial effusion. Coronary artery calcifications are noted. Mediastinum/Nodes: Thyroid gland is unremarkable. No adenopathy is noted. Severely dilated and fluid-filled esophagus is noted. Lungs/Pleura: No pneumothorax or pleural effusion is noted. Patchy airspace opacities are noted throughout the upper and lower lobes bilaterally most consistent with multifocal pneumonia. Upper Abdomen: Moderate to severe gastric distention is noted. Musculoskeletal: No chest wall abnormality. No acute or significant osseous findings. Review of the MIP images confirms the above findings. IMPRESSION: No definite evidence of pulmonary embolus. Patchy airspace opacities are noted throughout both lungs most consistent with multifocal pneumonia. Severely dilated  and fluid-filled esophagus is noted as well as moderate to severe gastric distention seen in visualized portion of upper abdomen. This is concerning for gastric outlet obstruction. Coronary artery calcifications are noted suggesting coronary artery disease. Aortic Atherosclerosis (ICD10-I70.0). Electronically Signed   By: Lupita Raider M.D.   On: 02/03/2023 19:03       Assessment & Plan:  Essential hypertension Assessment & Plan: Intolerant to amlodipine.  On hydralazine.  Also on coreg. Pressure as outlined. No changes in medication. Follow metabolic panel.    Gastroesophageal reflux disease, unspecified whether esophagitis present Assessment & Plan: No upper symptoms reported.  Continue nexium.    Hypercholesterolemia Assessment & Plan: Continue mevacor.  Low cholesterol diet and exercise.  Follow lipid panel and liver function tests.    Hyperglycemia Assessment & Plan: Low carb diet and exercise. Follow met b and a1c.    Multifocal pneumonia Assessment & Plan: Diagnosed in hospital and treated.  Breathing back to baseline. No increased cough or congestion.  Will need f/u cxr next visit.    PAF (paroxysmal atrial fibrillation) (HCC) Assessment & Plan: Noted during recent hospitalization.  Appears to be in SR today.  On eliquis and coreg.  Continue current medication regimen.    Swelling of lower extremity Assessment & Plan: Reports swelling is better in am and worse as day progresses. Discussed monitoring sodium intake. Leg elevation. Compression hose.  Follow.        Dale [place], MD

## 2023-04-26 ENCOUNTER — Encounter: Payer: Self-pay | Admitting: Internal Medicine

## 2023-04-26 DIAGNOSIS — M7989 Other specified soft tissue disorders: Secondary | ICD-10-CM | POA: Insufficient documentation

## 2023-04-26 NOTE — Assessment & Plan Note (Signed)
Continue mevacor.  Low cholesterol diet and exercise.  Follow lipid panel and liver function tests.  

## 2023-04-26 NOTE — Assessment & Plan Note (Signed)
No upper symptoms reported.  Continue nexium.  

## 2023-04-26 NOTE — Assessment & Plan Note (Signed)
Noted during recent hospitalization.  Appears to be in SR today.  On eliquis and coreg.  Continue current medication regimen.

## 2023-04-26 NOTE — Assessment & Plan Note (Signed)
Reports swelling is better in am and worse as day progresses. Discussed monitoring sodium intake. Leg elevation. Compression hose.  Follow.

## 2023-04-26 NOTE — Assessment & Plan Note (Signed)
Intolerant to amlodipine.  On hydralazine.  Also on coreg. Pressure as outlined. No changes in medication. Follow metabolic panel.

## 2023-04-26 NOTE — Assessment & Plan Note (Signed)
Low carb diet and exercise.  Follow met b and a1c.   

## 2023-04-26 NOTE — Assessment & Plan Note (Signed)
Diagnosed in hospital and treated.  Breathing back to baseline. No increased cough or congestion.  Will need f/u cxr next visit.

## 2023-04-29 ENCOUNTER — Encounter (INDEPENDENT_AMBULATORY_CARE_PROVIDER_SITE_OTHER): Payer: Self-pay

## 2023-04-29 DIAGNOSIS — F419 Anxiety disorder, unspecified: Secondary | ICD-10-CM | POA: Diagnosis not present

## 2023-04-29 DIAGNOSIS — N3010 Interstitial cystitis (chronic) without hematuria: Secondary | ICD-10-CM | POA: Diagnosis not present

## 2023-04-29 DIAGNOSIS — E7800 Pure hypercholesterolemia, unspecified: Secondary | ICD-10-CM | POA: Diagnosis not present

## 2023-04-29 DIAGNOSIS — S62653A Nondisplaced fracture of medial phalanx of left middle finger, initial encounter for closed fracture: Secondary | ICD-10-CM | POA: Diagnosis not present

## 2023-04-29 DIAGNOSIS — M80052D Age-related osteoporosis with current pathological fracture, left femur, subsequent encounter for fracture with routine healing: Secondary | ICD-10-CM | POA: Diagnosis not present

## 2023-04-29 DIAGNOSIS — I2510 Atherosclerotic heart disease of native coronary artery without angina pectoris: Secondary | ICD-10-CM | POA: Diagnosis not present

## 2023-04-29 DIAGNOSIS — S52502A Unspecified fracture of the lower end of left radius, initial encounter for closed fracture: Secondary | ICD-10-CM | POA: Diagnosis not present

## 2023-04-29 DIAGNOSIS — I10 Essential (primary) hypertension: Secondary | ICD-10-CM | POA: Diagnosis not present

## 2023-04-29 DIAGNOSIS — K5900 Constipation, unspecified: Secondary | ICD-10-CM | POA: Diagnosis not present

## 2023-04-29 DIAGNOSIS — I480 Paroxysmal atrial fibrillation: Secondary | ICD-10-CM | POA: Diagnosis not present

## 2023-04-29 DIAGNOSIS — S52502D Unspecified fracture of the lower end of left radius, subsequent encounter for closed fracture with routine healing: Secondary | ICD-10-CM | POA: Diagnosis not present

## 2023-04-29 DIAGNOSIS — R251 Tremor, unspecified: Secondary | ICD-10-CM | POA: Diagnosis not present

## 2023-05-12 ENCOUNTER — Ambulatory Visit: Payer: Medicare PPO | Admitting: Internal Medicine

## 2023-05-12 ENCOUNTER — Encounter: Payer: Self-pay | Admitting: Internal Medicine

## 2023-05-12 VITALS — BP 132/70 | HR 72 | Temp 97.9°F | Resp 16 | Ht 64.0 in | Wt 136.0 lb

## 2023-05-12 DIAGNOSIS — R739 Hyperglycemia, unspecified: Secondary | ICD-10-CM

## 2023-05-12 DIAGNOSIS — I480 Paroxysmal atrial fibrillation: Secondary | ICD-10-CM | POA: Diagnosis not present

## 2023-05-12 DIAGNOSIS — I700 Atherosclerosis of aorta: Secondary | ICD-10-CM

## 2023-05-12 DIAGNOSIS — D649 Anemia, unspecified: Secondary | ICD-10-CM | POA: Diagnosis not present

## 2023-05-12 DIAGNOSIS — E538 Deficiency of other specified B group vitamins: Secondary | ICD-10-CM

## 2023-05-12 DIAGNOSIS — E7800 Pure hypercholesterolemia, unspecified: Secondary | ICD-10-CM

## 2023-05-12 DIAGNOSIS — R9389 Abnormal findings on diagnostic imaging of other specified body structures: Secondary | ICD-10-CM

## 2023-05-12 DIAGNOSIS — N3010 Interstitial cystitis (chronic) without hematuria: Secondary | ICD-10-CM | POA: Diagnosis not present

## 2023-05-12 DIAGNOSIS — K219 Gastro-esophageal reflux disease without esophagitis: Secondary | ICD-10-CM | POA: Diagnosis not present

## 2023-05-12 DIAGNOSIS — Z8781 Personal history of (healed) traumatic fracture: Secondary | ICD-10-CM

## 2023-05-12 DIAGNOSIS — I10 Essential (primary) hypertension: Secondary | ICD-10-CM

## 2023-05-12 DIAGNOSIS — J189 Pneumonia, unspecified organism: Secondary | ICD-10-CM

## 2023-05-12 DIAGNOSIS — Z85820 Personal history of malignant melanoma of skin: Secondary | ICD-10-CM

## 2023-05-12 DIAGNOSIS — F439 Reaction to severe stress, unspecified: Secondary | ICD-10-CM

## 2023-05-12 LAB — CBC WITH DIFFERENTIAL/PLATELET
Basophils Absolute: 0 10*3/uL (ref 0.0–0.1)
Basophils Relative: 0.6 % (ref 0.0–3.0)
Eosinophils Absolute: 0 10*3/uL (ref 0.0–0.7)
Eosinophils Relative: 0.7 % (ref 0.0–5.0)
HCT: 36.4 % (ref 36.0–46.0)
Hemoglobin: 11.8 g/dL — ABNORMAL LOW (ref 12.0–15.0)
Lymphocytes Relative: 27.9 % (ref 12.0–46.0)
Lymphs Abs: 2 10*3/uL (ref 0.7–4.0)
MCHC: 32.3 g/dL (ref 30.0–36.0)
MCV: 86 fl (ref 78.0–100.0)
Monocytes Absolute: 0.7 10*3/uL (ref 0.1–1.0)
Monocytes Relative: 9.1 % (ref 3.0–12.0)
Neutro Abs: 4.4 10*3/uL (ref 1.4–7.7)
Neutrophils Relative %: 61.7 % (ref 43.0–77.0)
Platelets: 240 10*3/uL (ref 150.0–400.0)
RBC: 4.23 Mil/uL (ref 3.87–5.11)
RDW: 16.5 % — ABNORMAL HIGH (ref 11.5–15.5)
WBC: 7.2 10*3/uL (ref 4.0–10.5)

## 2023-05-12 LAB — BASIC METABOLIC PANEL
BUN: 8 mg/dL (ref 6–23)
CO2: 27 mEq/L (ref 19–32)
Calcium: 9.2 mg/dL (ref 8.4–10.5)
Chloride: 98 mEq/L (ref 96–112)
Creatinine, Ser: 0.96 mg/dL (ref 0.40–1.20)
GFR: 54.78 mL/min — ABNORMAL LOW (ref >60.00)
Glucose, Bld: 98 mg/dL (ref 70–99)
Potassium: 4.6 mEq/L (ref 3.5–5.1)
Sodium: 131 mEq/L — ABNORMAL LOW (ref 135–145)

## 2023-05-12 LAB — IBC + FERRITIN
Ferritin: 22.5 ng/mL (ref 10.0–291.0)
Iron: 55 ug/dL (ref 42–145)
Saturation Ratios: 15.1 % — ABNORMAL LOW (ref 20.0–50.0)
TIBC: 364 ug/dL (ref 250.0–450.0)
Transferrin: 260 mg/dL (ref 212.0–360.0)

## 2023-05-12 MED ORDER — CYANOCOBALAMIN 1000 MCG/ML IJ SOLN
1000.0000 ug | Freq: Once | INTRAMUSCULAR | Status: AC
Start: 2023-05-12 — End: 2023-05-12
  Administered 2023-05-12: 1000 ug via INTRAMUSCULAR

## 2023-05-12 NOTE — Progress Notes (Addendum)
Subjective:    [name], female    DOB: 08-13-1941, 82 y.o.   MRN: [id number]  Patient here for  Chief Complaint  Patient presents with   Medical Management of Chronic Issues    HPI Here for a scheduled follow up. Here to follow up regarding hypercholesterolemia, hypertension and afib. Recently admitted 02/01/23 - with left femur fracture.  Is S/p percutaneous fixation of left femoral neck hip fracture on 02/02/2023.  Continues f/u with ortho.  Also found to have multifocal pneumonia.  Treated with abx. Needs f/u cxr.  Did have afib with RVR.  Echo showed EF estimated at 65 to 70%, mild LVH, grade 1 diastolic dysfunction, mild to moderate TR.  Discharged on coreg and eliquis. F/u with cardioogy 03/20/23 - stable. Saw ortho 7//24 - left distal radius fracture and nondisplaced fracture of the middle phalanx. Had f/u 04/10/23 - placed in short arm cast. Has f/u tomorrow - ortho. Reports overall doing relatively well.  No chest pain.  Breathing stable.  No increased cough or congestion.  Discussed need for f/u cxr.  No abdominal pain reported.   Has f/u tomorrow with urology - f/u [facility].  On trospium. Discussed benefiber for her bowels.    Past Medical History:  Diagnosis Date   AK (actinic keratosis) 11/12/2022   left upper arm, tx'd with EDC   AK (actinic keratosis) 11/12/2022   right medial pretibia, LN2 11/25/22   Basal cell carcinoma 06/21/2020   left post shoulder sup, left mid chest, left lat thigh   Basal cell carcinoma 11/07/2021   L upper back, EDC   Basal cell carcinoma 11/07/2021   R upper back, EDC   Basal cell carcinoma 11/27/2021   right upper arm, EDC   Basal cell carcinoma 11/27/2021   right shoulder posterior, EDC   Basal cell carcinoma 11/27/2021   right anterior shoulder, EDC   Basal cell carcinoma (BCC) 06/13/2021   left dorsal foot, EDC 10/02/2021   Basal cell carcinoma (BCC) 06/13/2021   right postauricular tx'd w/ EDC   BCC (basal cell carcinoma of  skin) 03/30/2007   R inf med pretibial - BCC   BCC (basal cell carcinoma of skin) 10/12/2013   L nasal ala - BCC   BCC (basal cell carcinoma of skin) 03/10/2018   L mid dorsum med forearm - superficial BCC    BCC (basal cell carcinoma) 10/02/2021   left dorsal foot proximal, EDC 10/02/2021   BCC (basal cell carcinoma) 10/02/2021   left mid back, superficial EDC 11/07/21   BCC (basal cell carcinoma) 10/02/2021   right pretibia   BCC (basal cell carcinoma) 10/02/2021   right mid back, superficial EDC 11/07/21   BCC (basal cell carcinoma) 08/14/2022   left distal calf, tx'd with EDC   BCC (basal cell carcinoma) 11/12/2022   superficial at left lateral pretibial,l tx'd with EDC   BCC (basal cell carcinoma) 11/12/2022   superficial at mid back right of midline, scheduled for EDC   BCC (basal cell carcinoma) 11/12/2022   mid back right of midline, [facility] 11/25/22   Breast screening, unspecified 2013   Cancer (HCC) 1992   skin   Coronary artery calcification seen on CT scan    Degenerative disk disease    Diastolic dysfunction    a. 10/2021 Echo: EF 60-65%, no rwma, GrI DD, mild MR; b. 01/2023 Echo: EF 65-70%, no rwma, mild LVH, GrI DD Nl RV fxn. Triv MR. Mild-mod TR. AoV sclerosis.  GERD (gastroesophageal reflux disease)    History of basal cell carcinoma (BCC) 08/29/2020    left inferior knee lateral, , left inferior knee medial, right pretibia   History of SCC (squamous cell carcinoma) of skin 08/29/2020   right posterior calf, left lateral calf, and    Hypercholesterolemia 2008   Interstitial cystitis    followed by Dr Achilles Dunk   Near syncope    Other sign and symptom in breast 2013   Left upper outer quadrant breast "soreness" Ultrasound exam of right breast in the 2 o'clock position with the breast distracted medially showed a 0.3-0.4 with 0.5 cm simple cyst. In the 1 o'clock position where pt reported tenderness US exam was negatiive.  Because of her history of intermittent nipple drainage,  ultrasound was completed of the retroareolar area.   PAF (paroxysmal atrial fibrillation) (HCC)    a. dx 01/2023-->Eliquis 5 BID (CHA2DS2VASc = 5).   Personal history of tobacco use, presenting hazards to health    PONV (postoperative nausea and vomiting)    Primary hypertension    SCC (squamous cell carcinoma) 03/28/2008   R dorsum hand - SCC   SCC (squamous cell carcinoma) 05/09/2009   R lat lower leg - SCCIS   SCC (squamous cell carcinoma) 05/09/2009   L lat lower leg - SCCIS   SCC (squamous cell carcinoma) 04/07/2013   L lower leg - SCCIS   SCC (squamous cell carcinoma) 04/27/2013   R forearm - SCC   SCC (squamous cell carcinoma) 04/28/2017   L lat knee - SCCIS   SCC (squamous cell carcinoma) 05/12/2018   L prox dorsum forearm - SCC   SCC (squamous cell carcinoma) 06/13/2021   left forearm tx'd w/ EDC   SCC (squamous cell carcinoma), leg, right 03/30/2007   R sup pretibial - SCCIS   SCC (squamous cell carcinoma);BCC 10/12/2013   Mid back - superficial BCC with SCCIS    Special screening for malignant neoplasms, colon    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   left dorsal forearm, left lat calf   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   left lower leg superior, EDC at follow up   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   chest right of midline, tx'd with [facility]   Past Surgical History:  Procedure Laterality Date   ABDOMINAL HYSTERECTOMY  1992   APPENDECTOMY     CERVICAL DISCECTOMY     S/P C7-T1 discectomy with fusion   CHOLECYSTECTOMY  1990   COLONOSCOPY WITH PROPOFOL N/A 02/14/2016   Procedure: COLONOSCOPY WITH PROPOFOL;  Surgeon: Midge Minium, MD;  Location: [facility];  Service: Endoscopy;  Laterality: N/A;  PT WOULD LIKE 10 ARRIVAL TIME OR LATER   DILATION AND CURETTAGE OF UTERUS     ESOPHAGOGASTRODUODENOSCOPY (EGD) WITH PROPOFOL N/A 02/14/2016   Procedure: ESOPHAGOGASTRODUODENOSCOPY (EGD) WITH PROPOFOL with dialtion;  Surgeon: Midge Minium, MD;  Location: [facility];  Service: Endoscopy;  Laterality: N/A;   ESOPHAGOGASTRODUODENOSCOPY (EGD) WITH PROPOFOL N/A 04/13/2019   Procedure: ESOPHAGOGASTRODUODENOSCOPY (EGD) WITH PROPOFOL;  Surgeon: Toledo, Boykin Nearing, MD;  Location: [facility];  Service: Gastroenterology;  Laterality: N/A;   HIP PINNING,CANNULATED Left 02/02/2023   Procedure: PERCUTANEOUS FIXATION OF FEMORAL NECK;  Surgeon: Juanell Fairly, MD;  Location: [facility];  Service: Orthopedics;  Laterality: Left;   MELANOMA EXCISION     removed from Left calf 1994   Family History  Problem Relation Age of Onset   Hodgkin's lymphoma Mother  Heart failure Father    Heart attack Father    Arthritis Sister        Three sisters w/ degeneratve disk disease   Headache Sister        Two sisters hx of headache   Breast cancer Neg Hx    Colon cancer Neg Hx    Bladder Cancer Neg Hx    Kidney cancer Neg Hx    Social History   Socioeconomic History   Marital status: Widowed    Spouse name: Not on file   Number of children: 2   Years of education: Not on file   Highest education level: Not on file  Occupational History   Not on file  Tobacco Use   Smoking status: Former    Current packs/day: 1.00    Average packs/day: 1 pack/day for 15.0 years (15.0 ttl pk-yrs)    Types: Cigarettes   Smokeless tobacco: Never  Vaping Use   Vaping status: Never Used  Substance and Sexual Activity   Alcohol use: No    Alcohol/week: 0.0 standard drinks of alcohol   Drug use: No   Sexual activity: Not on file  Other Topics Concern   Not on file  Social History Narrative   Married and has 2 children, daughters.   Social Determinants of Health   Financial Resource Strain: Not on file  Food Insecurity: No Food Insecurity (02/11/2023)   Hunger Vital Sign    Worried About Running Out of Food in the Last Year: Never true    Ran Out of Food in the Last Year: Never true  Transportation Needs: No Transportation Needs  (02/11/2023)   PRAPARE - Administrator, Civil Service (Medical): No    Lack of Transportation (Non-Medical): No  Physical Activity: Not on file  Stress: Not on file  Social Connections: Not on file     Review of Systems  Constitutional:  Negative for appetite change and unexpected weight change.  HENT:  Negative for congestion and sinus pressure.   Respiratory:  Negative for cough and chest tightness.        Breathing stable.   Cardiovascular:  Negative for chest pain, palpitations and leg swelling.  Gastrointestinal:  Negative for abdominal pain, diarrhea, nausea and vomiting.  Genitourinary:  Negative for difficulty urinating and dysuria.  Musculoskeletal:  Negative for joint swelling and myalgias.  Skin:  Negative for color change and rash.  Neurological:  Negative for dizziness and headaches.  Psychiatric/Behavioral:  Negative for agitation and dysphoric mood.        Objective:     BP 132/70   Pulse 72   Temp 97.9 F (36.6 C)   Resp 16   Ht 5\' 4"  (1.626 m)   Wt 136 lb (61.7 kg)   LMP 11/20/1976   SpO2 98%   BMI 23.34 kg/m  Wt Readings from Last 3 Encounters:  05/12/23 136 lb (61.7 kg)  04/24/23 143 lb 9.6 oz (65.1 kg)  03/20/23 142 lb 12.8 oz (64.8 kg)    Physical Exam Vitals reviewed.  Constitutional:      General: She is not in acute distress.    Appearance: Normal appearance.  HENT:     Head: Normocephalic and atraumatic.     Right Ear: External ear normal.     Left Ear: External ear normal.  Eyes:     General: No scleral icterus.       Right eye: No discharge.  Left eye: No discharge.     Conjunctiva/sclera: Conjunctivae normal.  Neck:     Thyroid: No thyromegaly.  Cardiovascular:     Rate and Rhythm: Normal rate and regular rhythm.  Pulmonary:     Effort: No respiratory distress.     Breath sounds: Normal breath sounds. No wheezing.  Abdominal:     General: Bowel sounds are normal.     Palpations: Abdomen is soft.      Tenderness: There is no abdominal tenderness.  Musculoskeletal:        General: No swelling or tenderness.     Cervical back: Neck supple. No tenderness.  Lymphadenopathy:     Cervical: No cervical adenopathy.  Skin:    Findings: No erythema or rash.  Neurological:     Mental Status: She is alert.  Psychiatric:        Mood and Affect: Mood normal.        Behavior: Behavior normal.      Outpatient Encounter Medications as of 05/12/2023  Medication Sig   acetaminophen (TYLENOL) 325 MG tablet Take 650 mg by mouth as needed.   apixaban (ELIQUIS) 5 MG TABS tablet Take 1 tablet (5 mg total) by mouth 2 (two) times daily.   carvedilol (COREG) 25 MG tablet Take 1 tablet (25 mg total) by mouth 2 (two) times daily with a meal.   esomeprazole (NEXIUM) 40 MG capsule Take 1 capsule by mouth once daily   hydrALAZINE (APRESOLINE) 25 MG tablet Take 1 tablet (25 mg total) by mouth 2 (two) times daily.   lovastatin (MEVACOR) 20 MG tablet Take 1 tablet (20 mg total) by mouth at bedtime.   sertraline (ZOLOFT) 50 MG tablet Take 1 tablet (50 mg total) by mouth daily.   [DISCONTINUED] trospium (SANCTURA) 20 MG tablet Take 1 tablet (20 mg total) by mouth daily.   [EXPIRED] cyanocobalamin (VITAMIN B12) injection 1,000 mcg    No facility-administered encounter medications on file as of 05/12/2023.     Lab Results  Component Value Date   WBC 7.2 05/12/2023   HGB 11.8 (L) 05/12/2023   HCT 36.4 05/12/2023   PLT 240.0 05/12/2023   GLUCOSE 98 05/12/2023   CHOL 140 03/05/2023   TRIG 79.0 03/05/2023   HDL 54.70 03/05/2023   LDLCALC 70 03/05/2023   ALT 9 03/05/2023   AST 13 03/05/2023   NA 131 (L) 05/12/2023   K 4.6 05/12/2023   CL 98 05/12/2023   CREATININE 0.96 05/12/2023   BUN 8 05/12/2023   CO2 27 05/12/2023   TSH 4.50 11/04/2022   HGBA1C 5.2 03/05/2023    DG ABD ACUTE 2+V W 1V CHEST  Result Date: 02/04/2023 CLINICAL DATA:  Gastric outlet obstruction. EXAM: DG ABDOMEN ACUTE WITH 1 VIEW CHEST  COMPARISON:  Feb 01, 2023. FINDINGS: Mildly dilated small bowel loops are noted concerning for distal small bowel obstruction or ileus. Residual contrast is noted in urinary bladder. No radiopaque calculi or other significant radiographic abnormality is seen. Heart size and mediastinal contours are within normal limits. Mild bilateral perihilar opacities are noted concerning for pulmonary edema or inflammation, right greater than left. IMPRESSION: Dilated small bowel loops are noted concerning for distal small bowel obstruction or ileus. Bilateral lung opacities are noted, right greater than left, concerning for pulmonary edema or inflammation. Electronically Signed   By: Lupita Raider M.D.   On: 02/04/2023 12:55   ECHOCARDIOGRAM COMPLETE  Result Date: 02/04/2023    ECHOCARDIOGRAM REPORT   Patient Name:  JAHNAE MACNAMARA [facility] Date of Exam: 02/03/2023 Medical Rec #:  [id number]          Height:       64.0 in Accession #:    [id number]         Weight:       142.0 lb Date of Birth:  [birth year]          BSA:          1.691 m Patient Age:    82 years           BP:           159/63 mmHg Patient Gender: F                  HR:           115 bpm. Exam Location:  [facility] Procedure: 2D Echo, Cardiac Doppler and Color Doppler Indications:     R07.9 Chest pain  History:         Patient has prior history of Echocardiogram examinations, most                  recent 11/21/2021.  Sonographer:     Daphine Deutscher RDCS Referring Phys:  [id number] Diagnosing Phys: Yvonne Kendall MD IMPRESSIONS  1. Left ventricular ejection fraction, by estimation, is 65 to 70%. The left ventricle has normal function. The left ventricle has no regional wall motion abnormalities. There is mild left ventricular hypertrophy. Left ventricular diastolic parameters are consistent with Grade I diastolic dysfunction (impaired relaxation). Elevated left atrial pressure.  2. Right ventricular systolic function is mildly reduced. The right ventricular  size is normal. There is normal pulmonary artery systolic pressure.  3. The mitral valve is normal in structure. Trivial mitral valve regurgitation. No evidence of mitral stenosis.  4. Tricuspid valve regurgitation is mild to moderate.  5. The aortic valve has an indeterminant number of cusps. There is mild calcification of the aortic valve. There is mild thickening of the aortic valve. Aortic valve regurgitation is not visualized. Aortic valve sclerosis/calcification is present, without any evidence of aortic stenosis.  6. The inferior vena cava is normal in size with <50% respiratory variability, suggesting right atrial pressure of 8 mmHg. FINDINGS  Left Ventricle: Left ventricular ejection fraction, by estimation, is 65 to 70%. The left ventricle has normal function. The left ventricle has no regional wall motion abnormalities. The left ventricular internal cavity size was normal in size. There is  mild left ventricular hypertrophy. Left ventricular diastolic parameters are consistent with Grade I diastolic dysfunction (impaired relaxation). Elevated left atrial pressure. Right Ventricle: The right ventricular size is normal. No increase in right ventricular wall thickness. Right ventricular systolic function is mildly reduced. There is normal pulmonary artery systolic pressure. The tricuspid regurgitant velocity is 2.50 m/s, and with an assumed right atrial pressure of 8 mmHg, the estimated right ventricular systolic pressure is 33.0 mmHg. Left Atrium: Left atrial size was normal in size. Right Atrium: Right atrial size was normal in size. Pericardium: There is no evidence of pericardial effusion. Mitral Valve: The mitral valve is normal in structure. Trivial mitral valve regurgitation. No evidence of mitral valve stenosis. Tricuspid Valve: The tricuspid valve is normal in structure. Tricuspid valve regurgitation is mild to moderate. Aortic Valve: The aortic valve has an indeterminant number of cusps. There is  mild calcification of the aortic valve. There is mild thickening of the aortic valve. Aortic valve regurgitation is not visualized. Aortic valve  sclerosis/calcification is present, without any evidence of aortic stenosis. Aortic valve mean gradient measures 9.0 mmHg. Aortic valve peak gradient measures 11.4 mmHg. Aortic valve area, by VTI measures 2.05 cm. Pulmonic Valve: The pulmonic valve was normal in structure. Pulmonic valve regurgitation is trivial. No evidence of pulmonic stenosis. Aorta: The aortic root is normal in size and structure. Pulmonary Artery: The pulmonary artery is of normal size. Venous: The inferior vena cava is normal in size with less than 50% respiratory variability, suggesting right atrial pressure of 8 mmHg. IAS/Shunts: The interatrial septum was not well visualized.  LEFT VENTRICLE PLAX 2D LVIDd:         3.40 cm   Diastology LVIDs:         2.30 cm   LV e' medial:    6.20 cm/s LV PW:         1.20 cm   LV E/e' medial:  17.3 LV IVS:        1.20 cm   LV e' lateral:   6.05 cm/s LVOT diam:     1.90 cm   LV E/e' lateral: 17.7 LV SV:         53 LV SV Index:   31 LVOT Area:     2.84 cm  RIGHT VENTRICLE             IVC RV Basal diam:  3.30 cm     IVC diam: 1.60 cm RV S prime:     10.22 cm/s LEFT ATRIUM             Index        RIGHT ATRIUM           Index LA diam:        4.20 cm 2.48 cm/m   RA Area:     10.90 cm LA Vol (A2C):   41.3 ml 24.42 ml/m  RA Volume:   25.70 ml  15.19 ml/m LA Vol (A4C):   36.5 ml 21.58 ml/m LA Biplane Vol: 39.2 ml 23.18 ml/m  AORTIC VALVE AV Area (Vmax):    1.83 cm AV Area (Vmean):   1.80 cm AV Area (VTI):     2.05 cm AV Vmax:           168.78 cm/s AV Vmean:          118.474 cm/s AV VTI:            0.259 m AV Peak Grad:      11.4 mmHg AV Mean Grad:      9.0 mmHg LVOT Vmax:         108.75 cm/s LVOT Vmean:        75.350 cm/s LVOT VTI:          0.188 m LVOT/AV VTI ratio: 0.72  AORTA Ao Root diam: 3.30 cm Ao Asc diam:  2.90 cm MITRAL VALVE                TRICUSPID  VALVE MV Area (PHT): 2.93 cm     TR Peak grad:   25.0 mmHg MV Decel Time: 259 msec     TR Vmax:        250.00 cm/s MV E velocity: 107.14 cm/s MV A velocity: 144.50 cm/s  SHUNTS MV E/A ratio:  0.74         Systemic VTI:  0.19 m  Systemic Diam: 1.90 cm Yvonne Kendall MD Electronically signed by Yvonne Kendall MD Signature Date/Time: 02/04/2023/7:35:40 AM    Final    CT Angio Chest Pulmonary Embolism (PE) W or WO Contrast  Result Date: 02/03/2023 CLINICAL DATA:  High probability of pulmonary embolus. EXAM: CT ANGIOGRAPHY CHEST WITH CONTRAST TECHNIQUE: Multidetector CT imaging of the chest was performed using the standard protocol during bolus administration of intravenous contrast. Multiplanar CT image reconstructions and MIPs were obtained to evaluate the vascular anatomy. RADIATION DOSE REDUCTION: This exam was performed according to the departmental dose-optimization program which includes automated exposure control, adjustment of the mA and/or kV according to patient size and/or use of iterative reconstruction technique. CONTRAST:  60mL OMNIPAQUE IOHEXOL 350 MG/ML SOLN COMPARISON:  None Available. FINDINGS: Cardiovascular: Satisfactory opacification of the pulmonary arteries to the segmental level. No evidence of pulmonary embolism. Normal heart size. No pericardial effusion. Coronary artery calcifications are noted. Mediastinum/Nodes: Thyroid gland is unremarkable. No adenopathy is noted. Severely dilated and fluid-filled esophagus is noted. Lungs/Pleura: No pneumothorax or pleural effusion is noted. Patchy airspace opacities are noted throughout the upper and lower lobes bilaterally most consistent with multifocal pneumonia. Upper Abdomen: Moderate to severe gastric distention is noted. Musculoskeletal: No chest wall abnormality. No acute or significant osseous findings. Review of the MIP images confirms the above findings. IMPRESSION: No definite evidence of pulmonary embolus.  Patchy airspace opacities are noted throughout both lungs most consistent with multifocal pneumonia. Severely dilated and fluid-filled esophagus is noted as well as moderate to severe gastric distention seen in visualized portion of upper abdomen. This is concerning for gastric outlet obstruction. Coronary artery calcifications are noted suggesting coronary artery disease. Aortic Atherosclerosis (ICD10-I70.0). Electronically Signed   By: Lupita Raider M.D.   On: 02/03/2023 19:03       Assessment & Plan:  Anemia, unspecified type Assessment & Plan: Follow cbc.  Continue B12 injections.   Orders: -     CBC with Differential/Platelet -     IBC + Ferritin  Essential hypertension Assessment & Plan: Intolerant to amlodipine.  On hydralazine.  Also on coreg. Pressure as outlined. No changes in medication. Follow metabolic panel.   Orders: -     Basic metabolic panel  B12 deficiency -     Cyanocobalamin  Abnormal CXR Assessment & Plan:  Also found to have multifocal pneumonia.  Treated with abx. Needs f/u cxr. Unable to do today.  Will arrange for cxr when she returns for next B12 injection.   Orders: -     DG Chest 2 View; Future  Gastroesophageal reflux disease, unspecified whether esophagitis present Assessment & Plan: S/p EGD - gastritis.  On nexium now.  No problems reported today. States doing ok on this regimen.  Follow.     Aortic atherosclerosis (HCC) Assessment & Plan: Continue mevacor.    PAF (paroxysmal atrial fibrillation) (HCC) Assessment & Plan: Noted during recent hospitalization.  Appears to be in SR today.  On eliquis and coreg.  Continue current medication regimen.    Chronic interstitial cystitis Assessment & Plan: Followed by urology.  On sanctura.    H/O Malignant melanoma Assessment & Plan: Followed by dermatology.     History of hip fracture Assessment & Plan:  Dr Martha Clan - 02/02/23 - PERCUTANEOUS FIXATION OF LEFT FEMORAL NECK HIP FRACTURE.   Was in rehab and now home.  Doing well.  Continue f/u with ortho.     Hypercholesterolemia Assessment & Plan: Continue mevacor.  Low cholesterol diet and  exercise.  Follow lipid panel and liver function tests.    Hyperglycemia Assessment & Plan: Low carb diet and exercise. Follow met b and a1c.    Multifocal pneumonia Assessment & Plan: Diagnosed in hospital and treated.  Breathing back to baseline. No increased cough or congestion.  Needs f/u cxr.  Unable to do today.  Will plan f/u cxr with next B12 injection.    Stress Assessment & Plan: Continue zoloft.  Follow.        Dale Hilo, MD

## 2023-05-12 NOTE — Progress Notes (Unsigned)
05/13/2023 3:44 PM   Burnard Bunting 11-18-1940 [id number]  Referring provider: Dale [place], MD [address] [place],  [place] [address]  Urological history: 1. OAB -Contributing factors of age, GSM, hypertension, detrusor dyssynergia, hyperglycemia, COVID, constipation, antihistamines and smoking history -trospium IR 20 mg   2. IC  -contributing factors of age, GSM and constipation -Imipramine 10 mg daily -cannot take due to QT prolongation  Chief Complaint  Patient presents with   Follow-up   HPI: Rachael Austin is a 82 y.o. female who presents today for yearly follow up.   Previous records reviewed.   She is having 1-7 daytime voids with 3 or more episodes of nocturia with a severe urge to urinate.  She is having urge incontinence.  She is leaking 3 more times a day.  She wears 3-4 absorbent pads daily.  She does not limit fluid intake.  She does engage in toilet mapping.  She has been having cloudy urine and dysuria and worsening of her urinary continence since she came out of the hospital from her hip replacement in June.  Patient denies any modifying or aggravating factors.  Patient denies any recent UTI's, gross hematuria, dysuria or suprapubic/flank pain.  Patient denies any fevers, chills, nausea or vomiting.   UA yellow cloudy, specific gravity 1.015, trace heme, pH 6.5, 2+ protein, 3+ leukocytes, greater than 30 WBCs, 3-10 RBCs, mucus threads are present and many bacteria  PVR 109 mL   PMH: Past Medical History:  Diagnosis Date   AK (actinic keratosis) 11/12/2022   left upper arm, tx'd with EDC   AK (actinic keratosis) 11/12/2022   right medial pretibia, LN2 11/25/22   Basal cell carcinoma 06/21/2020   left post shoulder sup, left mid chest, left lat thigh   Basal cell carcinoma 11/07/2021   L upper back, EDC   Basal cell carcinoma 11/07/2021   R upper back, EDC   Basal cell carcinoma 11/27/2021   right upper arm, EDC    Basal cell carcinoma 11/27/2021   right shoulder posterior, EDC   Basal cell carcinoma 11/27/2021   right anterior shoulder, EDC   Basal cell carcinoma (BCC) 06/13/2021   left dorsal foot, EDC 10/02/2021   Basal cell carcinoma (BCC) 06/13/2021   right postauricular tx'd w/ EDC   BCC (basal cell carcinoma of skin) 03/30/2007   R inf med pretibial - BCC   BCC (basal cell carcinoma of skin) 10/12/2013   L nasal ala - BCC   BCC (basal cell carcinoma of skin) 03/10/2018   L mid dorsum med forearm - superficial BCC    BCC (basal cell carcinoma) 10/02/2021   left dorsal foot proximal, EDC 10/02/2021   BCC (basal cell carcinoma) 10/02/2021   left mid back, superficial EDC 11/07/21   BCC (basal cell carcinoma) 10/02/2021   right pretibia   BCC (basal cell carcinoma) 10/02/2021   right mid back, superficial EDC 11/07/21   BCC (basal cell carcinoma) 08/14/2022   left distal calf, tx'd with EDC   BCC (basal cell carcinoma) 11/12/2022   superficial at left lateral pretibial,l tx'd with EDC   BCC (basal cell carcinoma) 11/12/2022   superficial at mid back right of midline, scheduled for EDC   BCC (basal cell carcinoma) 11/12/2022   mid back right of midline, [facility] 11/25/22   Breast screening, unspecified 2013   Cancer (HCC) 1992   skin   Coronary artery calcification seen on CT scan    Degenerative disk  disease    Diastolic dysfunction    a. 10/2021 Echo: EF 60-65%, no rwma, GrI DD, mild MR; b. 01/2023 Echo: EF 65-70%, no rwma, mild LVH, GrI DD Nl RV fxn. Triv MR. Mild-mod TR. AoV sclerosis.   GERD (gastroesophageal reflux disease)    History of basal cell carcinoma (BCC) 08/29/2020    left inferior knee lateral, , left inferior knee medial, right pretibia   History of SCC (squamous cell carcinoma) of skin 08/29/2020   right posterior calf, left lateral calf, and    Hypercholesterolemia 2008   Interstitial cystitis    followed by Dr Achilles Dunk   Near syncope    Other sign and symptom in breast 2013    Left upper outer quadrant breast "soreness" Ultrasound exam of right breast in the 2 o'clock position with the breast distracted medially showed a 0.3-0.4 with 0.5 cm simple cyst. In the 1 o'clock position where pt reported tenderness US exam was negatiive.  Because of her history of intermittent nipple drainage, ultrasound was completed of the retroareolar area.   PAF (paroxysmal atrial fibrillation) (HCC)    a. dx 01/2023-->Eliquis 5 BID (CHA2DS2VASc = 5).   Personal history of tobacco use, presenting hazards to health    PONV (postoperative nausea and vomiting)    Primary hypertension    SCC (squamous cell carcinoma) 03/28/2008   R dorsum hand - SCC   SCC (squamous cell carcinoma) 05/09/2009   R lat lower leg - SCCIS   SCC (squamous cell carcinoma) 05/09/2009   L lat lower leg - SCCIS   SCC (squamous cell carcinoma) 04/07/2013   L lower leg - SCCIS   SCC (squamous cell carcinoma) 04/27/2013   R forearm - SCC   SCC (squamous cell carcinoma) 04/28/2017   L lat knee - SCCIS   SCC (squamous cell carcinoma) 05/12/2018   L prox dorsum forearm - SCC   SCC (squamous cell carcinoma) 06/13/2021   left forearm tx'd w/ EDC   SCC (squamous cell carcinoma), leg, right 03/30/2007   R sup pretibial - SCCIS   SCC (squamous cell carcinoma);BCC 10/12/2013   Mid back - superficial BCC with SCCIS    Special screening for malignant neoplasms, colon    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   left dorsal forearm, left lat calf   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   left lower leg superior, EDC at follow up   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   chest right of midline, tx'd with [facility]    Surgical History: Past Surgical History:  Procedure Laterality Date   ABDOMINAL HYSTERECTOMY  1992   APPENDECTOMY     CERVICAL DISCECTOMY     S/P C7-T1 discectomy with fusion   CHOLECYSTECTOMY  1990   COLONOSCOPY WITH PROPOFOL N/A 02/14/2016   Procedure: COLONOSCOPY WITH  PROPOFOL;  Surgeon: Midge Minium, MD;  Location[facility];  Service: Endoscopy;  Laterality: N/A;  PT WOULD LIKE 10 ARRIVAL TIME OR LATER   DILATION AND CURETTAGE OF UTERUS     ESOPHAGOGASTRODUODENOSCOPY (EGD) WITH PROPOFOL N/A 02/14/2016   Procedure: ESOPHAGOGASTRODUODENOSCOPY (EGD) WITH PROPOFOL with dialtion;  Surgeon: Midge Minium, MD;  Location: [facility];  Service: Endoscopy;  Laterality: N/A;   ESOPHAGOGASTRODUODENOSCOPY (EGD) WITH PROPOFOL N/A 04/13/2019   Procedure: ESOPHAGOGASTRODUODENOSCOPY (EGD) WITH PROPOFOL;  Surgeon: Toledo, Boykin Nearing, MD;  Location: [facility];  Service: Gastroenterology;  Laterality: N/A;   HIP PINNING,CANNULATED Left 02/02/2023   Procedure: PERCUTANEOUS FIXATION OF FEMORAL NECK;  Surgeon: Juanell Fairly, MD;  Location: [facility];  Service: Orthopedics;  Laterality: Left;   MELANOMA EXCISION     removed from Left calf 1994    Home Medications:  Allergies as of 05/13/2023       Reactions   Augmentin [amoxicillin-pot Clavulanate] Swelling, Rash   Swelling of the lips   Lexapro [escitalopram Oxalate]    Micardis [telmisartan] Itching   Myrbetriq [mirabegron] Itching   Niacin And Related Itching   Codeine Sulfate Other (See Comments)   "Makes her hyper"   Vesicare [solifenacin Succinate] Nausea And Vomiting, Rash        Medication List        Accurate as of May 13, 2023  3:44 PM. If you have any questions, ask your nurse or doctor.          acetaminophen 325 MG tablet Commonly known as: TYLENOL Take 650 mg by mouth as needed.   apixaban 5 MG Tabs tablet Commonly known as: ELIQUIS Take 1 tablet (5 mg total) by mouth 2 (two) times daily.   carvedilol 25 MG tablet Commonly known as: COREG Take 1 tablet (25 mg total) by mouth 2 (two) times daily with a meal.   cefUROXime 500 MG tablet Commonly known as: CEFTIN Take 1 tablet (500 mg total) by mouth 2 (two) times daily with a meal. Started by: Michiel Cowboy    esomeprazole 40 MG capsule Commonly known as: NEXIUM Take 1 capsule by mouth once daily   hydrALAZINE 25 MG tablet Commonly known as: APRESOLINE Take 1 tablet (25 mg total) by mouth 2 (two) times daily.   lovastatin 20 MG tablet Commonly known as: MEVACOR Take 1 tablet (20 mg total) by mouth at bedtime.   sertraline 50 MG tablet Commonly known as: ZOLOFT Take 1 tablet (50 mg total) by mouth daily.   trospium 20 MG tablet Commonly known as: SANCTURA Take 1 tablet (20 mg total) by mouth daily.        Allergies:  Allergies  Allergen Reactions   Augmentin [Amoxicillin-Pot Clavulanate] Swelling and Rash    Swelling of the lips   Lexapro [Escitalopram Oxalate]    Micardis [Telmisartan] Itching   Myrbetriq [Mirabegron] Itching   Niacin And Related Itching   Codeine Sulfate Other (See Comments)    "Makes her hyper"   Vesicare [Solifenacin Succinate] Nausea And Vomiting and Rash    Family History: Family History  Problem Relation Age of Onset   Hodgkin's lymphoma Mother    Heart failure Father    Heart attack Father    Arthritis Sister        Three sisters w/ degeneratve disk disease   Headache Sister        Two sisters hx of headache   Breast cancer Neg Hx    Colon cancer Neg Hx    Bladder Cancer Neg Hx    Kidney cancer Neg Hx     Social History:  reports that she has quit smoking. Her smoking use included cigarettes. She has a 15 pack-year smoking history. She has never used smokeless tobacco. She reports that she does not drink alcohol and does not use drugs.  ROS: Pertinent ROS in HPI  Physical Exam: BP 120/69   Pulse 77   LMP 11/20/1976   Constitutional:  Well nourished. Alert and oriented, No acute distress. HEENT: [place] AT, moist mucus membranes.  Trachea midline Cardiovascular: No clubbing, cyanosis, or edema. Respiratory: Normal respiratory effort, no increased work of breathing. Neurologic: Grossly intact,  no focal deficits, moving all 4  extremities. Psychiatric: Normal mood and affect.    Laboratory Data: Lab Results  Component Value Date   WBC 7.2 05/12/2023   HGB 11.8 (L) 05/12/2023   HCT 36.4 05/12/2023   MCV 86.0 05/12/2023   PLT 240.0 05/12/2023    Lab Results  Component Value Date   CREATININE 0.96 05/12/2023    Lab Results  Component Value Date   HGBA1C 5.2 03/05/2023    Lab Results  Component Value Date   TSH 4.50 11/04/2022       Component Value Date/Time   CHOL 140 03/05/2023 1058   HDL 54.70 03/05/2023 1058   CHOLHDL 3 03/05/2023 1058   VLDL 15.8 03/05/2023 1058   LDLCALC 70 03/05/2023 1058    Lab Results  Component Value Date   AST 13 03/05/2023   Lab Results  Component Value Date   ALT 9 03/05/2023    Urinalysis Results for orders placed or performed in visit on 05/13/23  Microscopic Examination   Urine  Result Value Ref Range   WBC, UA >30 (A) 0 - 5 /hpf   RBC, Urine 3-10 (A) 0 - 2 /hpf   Epithelial Cells (non renal) 0-10 0 - 10 /hpf   Mucus, UA Present (A) Not Estab.   Bacteria, UA Many (A) None seen/Few  Urinalysis, Complete  Result Value Ref Range   Specific Gravity, UA 1.015 1.005 - 1.030   pH, UA 6.5 5.0 - 7.5   Color, UA Yellow Yellow   Appearance Ur Cloudy (A) Clear   Leukocytes,UA 3+ (A) Negative   Protein,UA 2+ (A) Negative/Trace   Glucose, UA Negative Negative   Ketones, UA Negative Negative   RBC, UA Trace (A) Negative   Bilirubin, UA Negative Negative   Urobilinogen, Ur 1.0 0.2 - 1.0 mg/dL   Nitrite, UA Negative Negative   Microscopic Examination See below:   BLADDER SCAN AMB NON-IMAGING  Result Value Ref Range   Scan Result 109 ml      I have reviewed the labs.   Pertinent Imaging:  05/13/23 14:11  Scan Result 109 ml    Assessment & Plan:    1. OAB wet -She has had a worsening of her urinary symptoms over the past 2 months -UA with pyuria, hematuria and bacteriuria -PVR demonstrates adequate emptying -Continue trospium IR 20 mg  daily -I will send the urine for culture and start her empirically on Ceftin 500 mg twice daily  2. IC -Worsening symptoms -Cannot be on imipramine due to her history of QT prolongation  3. Microscopic hematuria -UA w/ micro heme -Will need to check her urine in 2 months to make sure the hematuria clears with treatment of the infection, if urine culture is negative we will need to pursue hematuria workup  Return in about 2 months (around 07/13/2023) for UA, OAB and PVR .  These notes generated with voice recognition software. I apologize for typographical errors.  Cloretta Ned  [facility] [address]  [address] [place], [place] [address] [phone]

## 2023-05-13 ENCOUNTER — Telehealth: Payer: Self-pay

## 2023-05-13 ENCOUNTER — Ambulatory Visit: Payer: Medicare PPO | Admitting: Urology

## 2023-05-13 ENCOUNTER — Encounter: Payer: Self-pay | Admitting: Urology

## 2023-05-13 VITALS — BP 120/69 | HR 77

## 2023-05-13 DIAGNOSIS — R3989 Other symptoms and signs involving the genitourinary system: Secondary | ICD-10-CM

## 2023-05-13 DIAGNOSIS — R3121 Asymptomatic microscopic hematuria: Secondary | ICD-10-CM

## 2023-05-13 DIAGNOSIS — N3010 Interstitial cystitis (chronic) without hematuria: Secondary | ICD-10-CM

## 2023-05-13 DIAGNOSIS — S72002A Fracture of unspecified part of neck of left femur, initial encounter for closed fracture: Secondary | ICD-10-CM | POA: Diagnosis not present

## 2023-05-13 DIAGNOSIS — S52502A Unspecified fracture of the lower end of left radius, initial encounter for closed fracture: Secondary | ICD-10-CM | POA: Diagnosis not present

## 2023-05-13 DIAGNOSIS — N3281 Overactive bladder: Secondary | ICD-10-CM

## 2023-05-13 DIAGNOSIS — E871 Hypo-osmolality and hyponatremia: Secondary | ICD-10-CM

## 2023-05-13 LAB — URINALYSIS, COMPLETE
Bilirubin, UA: NEGATIVE
Glucose, UA: NEGATIVE
Ketones, UA: NEGATIVE
Nitrite, UA: NEGATIVE
Specific Gravity, UA: 1.015 (ref 1.005–1.030)
Urobilinogen, Ur: 1 mg/dL (ref 0.2–1.0)
pH, UA: 6.5 (ref 5.0–7.5)

## 2023-05-13 LAB — MICROSCOPIC EXAMINATION: WBC, UA: >30 /hpf — AB (ref 0–5)

## 2023-05-13 LAB — BLADDER SCAN AMB NON-IMAGING: Scan Result: 109

## 2023-05-13 MED ORDER — CEFUROXIME AXETIL 500 MG PO TABS: 500.0000 mg | ORAL_TABLET | Freq: Two times a day (BID) | ORAL | 0 refills | Status: DC

## 2023-05-13 MED ORDER — TROSPIUM CHLORIDE 20 MG PO TABS
20.0000 mg | ORAL_TABLET | Freq: Every day | ORAL | 3 refills | Status: DC
Start: 2023-05-13 — End: 2024-06-06

## 2023-05-13 MED ORDER — SULFAMETHOXAZOLE-TRIMETHOPRIM 800-160 MG PO TABS
1.0000 | ORAL_TABLET | Freq: Two times a day (BID) | ORAL | 0 refills | Status: DC
Start: 2023-05-13 — End: 2023-05-13

## 2023-05-13 NOTE — Telephone Encounter (Signed)
Patient just returned call. I read her the message. She still would like for someone to call her in the morning. Her number is [phone].

## 2023-05-13 NOTE — Telephone Encounter (Signed)
-----   Message from [place] sent at 05/13/2023  6:00 AM EDT ----- Notify - kidney function is stable.  Sodium has decreased some.  Will need to follow.  Recheck sodium within one week to confirm stable/improved. Hgb improved.  Iron studies improved. Continue iron

## 2023-05-14 ENCOUNTER — Encounter (INDEPENDENT_AMBULATORY_CARE_PROVIDER_SITE_OTHER): Payer: Self-pay

## 2023-05-14 ENCOUNTER — Telehealth: Payer: Self-pay

## 2023-05-14 DIAGNOSIS — M80032D Age-related osteoporosis with current pathological fracture, left forearm, subsequent encounter for fracture with routine healing: Secondary | ICD-10-CM | POA: Diagnosis not present

## 2023-05-14 DIAGNOSIS — K5900 Constipation, unspecified: Secondary | ICD-10-CM

## 2023-05-14 DIAGNOSIS — H538 Other visual disturbances: Secondary | ICD-10-CM

## 2023-05-14 DIAGNOSIS — K219 Gastro-esophageal reflux disease without esophagitis: Secondary | ICD-10-CM

## 2023-05-14 DIAGNOSIS — I2510 Atherosclerotic heart disease of native coronary artery without angina pectoris: Secondary | ICD-10-CM

## 2023-05-14 DIAGNOSIS — H9193 Unspecified hearing loss, bilateral: Secondary | ICD-10-CM

## 2023-05-14 DIAGNOSIS — I130 Hypertensive heart and chronic kidney disease with heart failure and stage 1 through stage 4 chronic kidney disease, or unspecified chronic kidney disease: Secondary | ICD-10-CM | POA: Diagnosis not present

## 2023-05-14 DIAGNOSIS — Z9089 Acquired absence of other organs: Secondary | ICD-10-CM

## 2023-05-14 DIAGNOSIS — Z974 Presence of external hearing-aid: Secondary | ICD-10-CM

## 2023-05-14 DIAGNOSIS — N3010 Interstitial cystitis (chronic) without hematuria: Secondary | ICD-10-CM | POA: Diagnosis not present

## 2023-05-14 DIAGNOSIS — D631 Anemia in chronic kidney disease: Secondary | ICD-10-CM | POA: Diagnosis not present

## 2023-05-14 DIAGNOSIS — J45909 Unspecified asthma, uncomplicated: Secondary | ICD-10-CM

## 2023-05-14 DIAGNOSIS — Z85828 Personal history of other malignant neoplasm of skin: Secondary | ICD-10-CM

## 2023-05-14 DIAGNOSIS — Z981 Arthrodesis status: Secondary | ICD-10-CM

## 2023-05-14 DIAGNOSIS — I5020 Unspecified systolic (congestive) heart failure: Secondary | ICD-10-CM | POA: Diagnosis not present

## 2023-05-14 DIAGNOSIS — M80052D Age-related osteoporosis with current pathological fracture, left femur, subsequent encounter for fracture with routine healing: Secondary | ICD-10-CM | POA: Diagnosis not present

## 2023-05-14 DIAGNOSIS — N184 Chronic kidney disease, stage 4 (severe): Secondary | ICD-10-CM | POA: Diagnosis not present

## 2023-05-14 DIAGNOSIS — R251 Tremor, unspecified: Secondary | ICD-10-CM

## 2023-05-14 DIAGNOSIS — Z556 Problems related to health literacy: Secondary | ICD-10-CM

## 2023-05-14 DIAGNOSIS — F17210 Nicotine dependence, cigarettes, uncomplicated: Secondary | ICD-10-CM

## 2023-05-14 DIAGNOSIS — Z7901 Long term (current) use of anticoagulants: Secondary | ICD-10-CM

## 2023-05-14 DIAGNOSIS — Z5982 Transportation insecurity: Secondary | ICD-10-CM

## 2023-05-14 DIAGNOSIS — F419 Anxiety disorder, unspecified: Secondary | ICD-10-CM

## 2023-05-14 DIAGNOSIS — I480 Paroxysmal atrial fibrillation: Secondary | ICD-10-CM | POA: Diagnosis not present

## 2023-05-14 DIAGNOSIS — E7800 Pure hypercholesterolemia, unspecified: Secondary | ICD-10-CM | POA: Diagnosis not present

## 2023-05-14 DIAGNOSIS — Z9049 Acquired absence of other specified parts of digestive tract: Secondary | ICD-10-CM

## 2023-05-14 DIAGNOSIS — Z9181 History of falling: Secondary | ICD-10-CM

## 2023-05-14 NOTE — Addendum Note (Signed)
Addended by: Rita Ohara D on: 05/14/2023 02:00 PM   Modules accepted: Orders

## 2023-05-14 NOTE — Telephone Encounter (Signed)
Discussed results with patient. Scheduled for repeat sodium. Lab ordered.

## 2023-05-14 NOTE — Telephone Encounter (Signed)
Rachael Austin called from [facility] to request we resend the fax to her from 05/13/2023 at 2:51pm.  Rachael Austin states she only received page 2 of the 3 pages.

## 2023-05-14 NOTE — Telephone Encounter (Signed)
Re-faxed.

## 2023-05-17 ENCOUNTER — Encounter: Payer: Self-pay | Admitting: Internal Medicine

## 2023-05-17 NOTE — Assessment & Plan Note (Signed)
No upper symptoms reported.  Continue nexium.  

## 2023-05-17 NOTE — Assessment & Plan Note (Signed)
Low carb diet and exercise.  Follow met b and a1c.   

## 2023-05-17 NOTE — Assessment & Plan Note (Addendum)
Followed by dermatology

## 2023-05-17 NOTE — Assessment & Plan Note (Signed)
S/p EGD - gastritis.  On nexium now.  No problems reported today. States doing ok on this regimen.  Follow.

## 2023-05-17 NOTE — Assessment & Plan Note (Signed)
Follow cbc.  Continue B12 injections.

## 2023-05-17 NOTE — Assessment & Plan Note (Signed)
Continue mevacor.  Low cholesterol diet and exercise.  Follow lipid panel and liver function tests.  

## 2023-05-17 NOTE — Assessment & Plan Note (Signed)
 Intolerant to amlodipine.  On hydralazine.  Also on coreg. Pressure as outlined. No changes in medication. Follow metabolic panel.

## 2023-05-17 NOTE — Assessment & Plan Note (Signed)
Diagnosed in hospital and treated.  Breathing back to baseline. No increased cough or congestion.  Needs f/u cxr.  Unable to do today.  Will plan f/u cxr with next B12 injection.

## 2023-05-17 NOTE — Assessment & Plan Note (Signed)
Followed by urology.  On sanctura.

## 2023-05-17 NOTE — Assessment & Plan Note (Signed)
Continue mevacor °

## 2023-05-17 NOTE — Assessment & Plan Note (Signed)
Continue zoloft.  Follow.   

## 2023-05-17 NOTE — Assessment & Plan Note (Signed)
Also found to have multifocal pneumonia.  Treated with abx. Needs f/u cxr. Unable to do today.  Will arrange for cxr when she returns for next B12 injection.

## 2023-05-17 NOTE — Assessment & Plan Note (Signed)
 Noted during recent hospitalization.  Appears to be in SR today.  On eliquis and coreg.  Continue current medication regimen.

## 2023-05-17 NOTE — Assessment & Plan Note (Signed)
Dr Martha Clan - 02/02/23 - PERCUTANEOUS FIXATION OF LEFT FEMORAL NECK HIP FRACTURE.  Was in rehab and now home.  Doing well.  Continue f/u with ortho.

## 2023-05-18 LAB — CULTURE, URINE COMPREHENSIVE

## 2023-05-19 ENCOUNTER — Other Ambulatory Visit (INDEPENDENT_AMBULATORY_CARE_PROVIDER_SITE_OTHER): Payer: Medicare PPO

## 2023-05-19 DIAGNOSIS — E871 Hypo-osmolality and hyponatremia: Secondary | ICD-10-CM

## 2023-05-19 LAB — SODIUM: Sodium: 131 mEq/L — ABNORMAL LOW (ref 135–145)

## 2023-05-21 ENCOUNTER — Other Ambulatory Visit: Payer: Self-pay | Admitting: Internal Medicine

## 2023-05-21 DIAGNOSIS — E871 Hypo-osmolality and hyponatremia: Secondary | ICD-10-CM

## 2023-05-21 NOTE — Progress Notes (Signed)
Order placed for f/u labs.  

## 2023-05-22 ENCOUNTER — Telehealth: Payer: Self-pay | Admitting: *Deleted

## 2023-05-22 NOTE — Telephone Encounter (Addendum)
-----   Message from [place] sent at 05/21/2023  8:05 AM EDT ----- Please notify - sodium level is stable.  Remains decreased but stable.  We will need to continue to follow.  I would like to recheck her sodium and a couple of other labs to see if contributing.  Please schedule non fasting lab in 7-10 days.  I will place order for labs.

## 2023-05-22 NOTE — Telephone Encounter (Signed)
Left voicemail to return call (mychart message was not read)  *SEE BELOW*

## 2023-05-22 NOTE — Telephone Encounter (Signed)
Patient states she is returning our call.  I read message from Dr. Dale Clarkson to patient.  I scheduled patient for a lab appointment on 05/27/2023.

## 2023-05-27 ENCOUNTER — Other Ambulatory Visit (INDEPENDENT_AMBULATORY_CARE_PROVIDER_SITE_OTHER): Payer: Medicare PPO

## 2023-05-27 DIAGNOSIS — E871 Hypo-osmolality and hyponatremia: Secondary | ICD-10-CM

## 2023-05-27 DIAGNOSIS — S52502A Unspecified fracture of the lower end of left radius, initial encounter for closed fracture: Secondary | ICD-10-CM | POA: Diagnosis not present

## 2023-05-27 LAB — HEPATIC FUNCTION PANEL
ALT: 8 U/L (ref 0–35)
AST: 14 U/L (ref 0–37)
Albumin: 4 g/dL (ref 3.5–5.2)
Alkaline Phosphatase: 72 U/L (ref 39–117)
Bilirubin, Direct: 0.1 mg/dL (ref 0.0–0.3)
Total Bilirubin: 0.5 mg/dL (ref 0.2–1.2)
Total Protein: 6.4 g/dL (ref 6.0–8.3)

## 2023-05-27 LAB — TSH: TSH: 2.8 u[IU]/mL (ref 0.35–5.50)

## 2023-05-27 LAB — BASIC METABOLIC PANEL
BUN: 9 mg/dL (ref 6–23)
CO2: 27 mEq/L (ref 19–32)
Calcium: 9 mg/dL (ref 8.4–10.5)
Chloride: 99 mEq/L (ref 96–112)
Creatinine, Ser: 0.84 mg/dL (ref 0.40–1.20)
GFR: 64.28 mL/min (ref >60.00)
Glucose, Bld: 113 mg/dL — ABNORMAL HIGH (ref 70–99)
Potassium: 4.4 mEq/L (ref 3.5–5.1)
Sodium: 132 mEq/L — ABNORMAL LOW (ref 135–145)

## 2023-05-28 ENCOUNTER — Other Ambulatory Visit: Payer: Self-pay | Admitting: Internal Medicine

## 2023-05-28 DIAGNOSIS — D649 Anemia, unspecified: Secondary | ICD-10-CM

## 2023-05-28 DIAGNOSIS — I10 Essential (primary) hypertension: Secondary | ICD-10-CM

## 2023-05-28 DIAGNOSIS — E7800 Pure hypercholesterolemia, unspecified: Secondary | ICD-10-CM

## 2023-05-28 DIAGNOSIS — R739 Hyperglycemia, unspecified: Secondary | ICD-10-CM

## 2023-05-28 LAB — OSMOLALITY, URINE: Osmolality, Ur: 305 mosm/kg (ref 50–1200)

## 2023-05-28 LAB — OSMOLALITY: Osmolality: 271 mosm/kg — ABNORMAL LOW (ref 278–305)

## 2023-05-28 LAB — SODIUM, URINE, RANDOM: Sodium, Ur: 29 mmol/L (ref 28–272)

## 2023-05-28 NOTE — Progress Notes (Signed)
Order placed for f/u labs.  

## 2023-06-01 ENCOUNTER — Telehealth: Payer: Self-pay | Admitting: Urology

## 2023-06-01 ENCOUNTER — Encounter: Payer: Self-pay | Admitting: Internal Medicine

## 2023-06-01 DIAGNOSIS — E871 Hypo-osmolality and hyponatremia: Secondary | ICD-10-CM | POA: Insufficient documentation

## 2023-06-01 NOTE — Telephone Encounter (Signed)
Mrs. [place] did not respond to my MyChart message.  Mrs. Market, Your urine culture was negative for infection, so you can stop the Ceftin.  Because the urine culture was negative, we will need to conduct more tests to see where the blood is coming from.  This consists of a CT scan with contrast.  Do you have any allergies to the contrast they use with CT scans?   Regards, Bodhi Stenglein, PA-C  I need to know if she has allergies to contrast, iodine or seafood, so we can schedule a CT urogram.

## 2023-06-02 ENCOUNTER — Telehealth: Payer: Self-pay | Admitting: Internal Medicine

## 2023-06-02 DIAGNOSIS — E871 Hypo-osmolality and hyponatremia: Secondary | ICD-10-CM

## 2023-06-02 NOTE — Telephone Encounter (Signed)
Annice Pih from [place], [phone] called. She needs a referral from Dr Lorin Picket for this patient to be seen there.

## 2023-06-02 NOTE — Telephone Encounter (Signed)
Let me know if you are ok with me placing referral. Can see me about this tomorrow AM.

## 2023-06-02 NOTE — Telephone Encounter (Signed)
Spoke with daughter Annabelle Harman per [place], she seen the results, she is the proxy on her mother's mychart. Daughter would like to just watch and see where things are going. They decline further testing.

## 2023-06-02 NOTE — Telephone Encounter (Signed)
See me about this.  See latest lab result note regarding referral.  If agreeable, I can place order for referral.

## 2023-06-03 NOTE — Telephone Encounter (Signed)
Spoke with patient regarding below. Agreeable to referral. Referral placed. Spoke with daughter [name] regarding referral per patient request. Called and spoke with Annice Pih at [facility]. Faxed over referral info so patient can be scheduled

## 2023-06-03 NOTE — Addendum Note (Signed)
Addended by: Rita Ohara D on: 06/03/2023 09:18 AM   Modules accepted: Orders

## 2023-06-11 ENCOUNTER — Ambulatory Visit: Payer: Medicare PPO

## 2023-06-15 ENCOUNTER — Ambulatory Visit (INDEPENDENT_AMBULATORY_CARE_PROVIDER_SITE_OTHER): Payer: Medicare PPO

## 2023-06-15 DIAGNOSIS — E538 Deficiency of other specified B group vitamins: Secondary | ICD-10-CM

## 2023-06-15 MED ORDER — CYANOCOBALAMIN 1000 MCG/ML IJ SOLN
1000.0000 ug | Freq: Once | INTRAMUSCULAR | Status: AC
Start: 2023-06-15 — End: 2023-06-15
  Administered 2023-06-15: 1000 ug via INTRAMUSCULAR

## 2023-06-15 NOTE — Progress Notes (Signed)
Pt presented for their vitamin B12 injection. Pt was identified through two identifiers. Pt tolerated shot well in their right deltoid.  

## 2023-06-22 NOTE — Progress Notes (Unsigned)
Neck cardiology Office Note  Date:  06/23/2023   ID:  Jhordyn, Zahrt Nov 10, 1940, MRN [id number]  PCP:  Dale [place], MD   Chief Complaint  Patient presents with   3 month follow up     "Doing well." Medications reviewed by the patient verbally.     HPI:  Ms. Oretha Abby is a 82 year old woman with past medical history of Hypertension Hyperlipidemia Who presents for f/u of her near syncope, aortic atherosclerosis, chest pain, paroxysmal atrial fibrillation  Recent events reviewed Hospitalization May 2024 Left hip fracture, After mechanical fall S/p Percutaneous fixation of left femoral neck performed Paroxysmal atrial fibrillation, noted on admission In the setting of a fall presenting with hypertensive urgency systolic pressure 200, chest pain, aspiration pneumonia, ileus on IV fluids, status post left hip fracture surgery Converting to normal sinus rhythm on diltiazem infusion 15 mg/h, labetalol every 6 hours  CT scan negative for PE  Several weeks later had additional falls with fracture to left arm  In follow-up she reports she is slowly getting stronger Still some discomfort in her left arm, legs weak, uses a cane Trying to get out more  Denies near-syncope or syncope, no lightheadedness   EKG personally reviewed by myself on todays visit EKG Interpretation Date/Time:  Tuesday June 23 2023 12:07:42 EDT Ventricular Rate:  61 PR Interval:  176 QRS Duration:  112 QT Interval:  414 QTC Calculation: 416 R Axis:   4  Text Interpretation: Normal sinus rhythm Right bundle branch block When compared with ECG of 04-Feb-2023 05:29, No significant change was found Confirmed by Julien Nordmann [phone]) on 06/23/2023 12:23:33 PM    PMH:   has a past medical history of AK (actinic keratosis) (11/12/2022), AK (actinic keratosis) (11/12/2022), Basal cell carcinoma (06/21/2020), Basal cell carcinoma (11/07/2021), Basal cell carcinoma (11/07/2021), Basal cell  carcinoma (11/27/2021), Basal cell carcinoma (11/27/2021), Basal cell carcinoma (11/27/2021), Basal cell carcinoma (BCC) (06/13/2021), Basal cell carcinoma (BCC) (06/13/2021), BCC (basal cell carcinoma of skin) (03/30/2007), BCC (basal cell carcinoma of skin) (10/12/2013), BCC (basal cell carcinoma of skin) (03/10/2018), BCC (basal cell carcinoma) (10/02/2021), BCC (basal cell carcinoma) (10/02/2021), BCC (basal cell carcinoma) (10/02/2021), BCC (basal cell carcinoma) (10/02/2021), BCC (basal cell carcinoma) (08/14/2022), BCC (basal cell carcinoma) (11/12/2022), BCC (basal cell carcinoma) (11/12/2022), BCC (basal cell carcinoma) (11/12/2022), Breast screening, unspecified (2013), Cancer (HCC) (1992), Coronary artery calcification seen on CT scan, Degenerative disk disease, Diastolic dysfunction, GERD (gastroesophageal reflux disease), History of basal cell carcinoma (BCC) (08/29/2020), History of SCC (squamous cell carcinoma) of skin (08/29/2020), Hypercholesterolemia (2008), Interstitial cystitis, Near syncope, Other sign and symptom in breast (2013), PAF (paroxysmal atrial fibrillation) (HCC), Personal history of tobacco use, presenting hazards to health, PONV (postoperative nausea and vomiting), Primary hypertension, SCC (squamous cell carcinoma) (03/28/2008), SCC (squamous cell carcinoma) (05/09/2009), SCC (squamous cell carcinoma) (05/09/2009), SCC (squamous cell carcinoma) (04/07/2013), SCC (squamous cell carcinoma) (04/27/2013), SCC (squamous cell carcinoma) (04/28/2017), SCC (squamous cell carcinoma) (05/12/2018), SCC (squamous cell carcinoma) (06/13/2021), SCC (squamous cell carcinoma), leg, right (03/30/2007), SCC (squamous cell carcinoma);BCC (10/12/2013), Special screening for malignant neoplasms, colon, Squamous cell carcinoma in situ (06/21/2020), Squamous cell carcinoma in situ (SCCIS) (08/14/2022), and Squamous cell carcinoma in situ (SCCIS) (11/12/2022).  PSH:    Past Surgical History:   Procedure Laterality Date   ABDOMINAL HYSTERECTOMY  1992   APPENDECTOMY     CERVICAL DISCECTOMY     S/P C7-T1 discectomy with fusion   CHOLECYSTECTOMY  1990   COLONOSCOPY WITH PROPOFOL N/A 02/14/2016  Procedure: COLONOSCOPY WITH PROPOFOL;  Surgeon: Midge Minium, MD;  Location: [facility];  Service: Endoscopy;  Laterality: N/A;  PT WOULD LIKE 10 ARRIVAL TIME OR LATER   DILATION AND CURETTAGE OF UTERUS     ESOPHAGOGASTRODUODENOSCOPY (EGD) WITH PROPOFOL N/A 02/14/2016   Procedure: ESOPHAGOGASTRODUODENOSCOPY (EGD) WITH PROPOFOL with dialtion;  Surgeon: Midge Minium, MD;  Location: [facility];  Service: Endoscopy;  Laterality: N/A;   ESOPHAGOGASTRODUODENOSCOPY (EGD) WITH PROPOFOL N/A 04/13/2019   Procedure: ESOPHAGOGASTRODUODENOSCOPY (EGD) WITH PROPOFOL;  Surgeon: Toledo, Boykin Nearing, MD;  Location: [facility];  Service: Gastroenterology;  Laterality: N/A;   HIP PINNING,CANNULATED Left 02/02/2023   Procedure: PERCUTANEOUS FIXATION OF FEMORAL NECK;  Surgeon: Juanell Fairly, MD;  Location: [facility];  Service: Orthopedics;  Laterality: Left;   MELANOMA EXCISION     removed from Left calf 1994    Current Outpatient Medications  Medication Sig Dispense Refill   acetaminophen (TYLENOL) 325 MG tablet Take 650 mg by mouth as needed.     apixaban (ELIQUIS) 5 MG TABS tablet Take 1 tablet (5 mg total) by mouth 2 (two) times daily. 60 tablet 3   carvedilol (COREG) 25 MG tablet Take 1 tablet (25 mg total) by mouth 2 (two) times daily with a meal. 180 tablet 1   cefUROXime (CEFTIN) 500 MG tablet Take 1 tablet (500 mg total) by mouth 2 (two) times daily with a meal. 14 tablet 0   esomeprazole (NEXIUM) 40 MG capsule Take 1 capsule by mouth once daily 90 capsule 3   hydrALAZINE (APRESOLINE) 25 MG tablet Take 1 tablet (25 mg total) by mouth 2 (two) times daily. 180 tablet 3   imipramine (TOFRANIL) 10 MG tablet Take 10 mg by mouth at bedtime.     lovastatin (MEVACOR) 20 MG tablet Take 1 tablet  (20 mg total) by mouth at bedtime. 90 tablet 1   sertraline (ZOLOFT) 50 MG tablet Take 1 tablet (50 mg total) by mouth daily. 90 tablet 1   trospium (SANCTURA) 20 MG tablet Take 1 tablet (20 mg total) by mouth daily. 90 tablet 3   No current facility-administered medications for this visit.     Allergies:   Augmentin [amoxicillin-pot clavulanate], Lexapro [escitalopram oxalate], Micardis [telmisartan], Myrbetriq [mirabegron], Niacin and related, Codeine sulfate, and Vesicare [solifenacin succinate]   Social History:  The patient  reports that she has quit smoking. Her smoking use included cigarettes. She has a 15 pack-year smoking history. She has never used smokeless tobacco. She reports that she does not drink alcohol and does not use drugs.   Family History:   family history includes Arthritis in her sister; Headache in her sister; Heart attack in her father; Heart failure in her father; Hodgkin's lymphoma in her mother.    Review of Systems: Review of Systems  Constitutional: Negative.   HENT: Negative.    Respiratory: Negative.    Cardiovascular: Negative.   Gastrointestinal: Negative.   Musculoskeletal: Negative.   Neurological: Negative.   Psychiatric/Behavioral: Negative.    All other systems reviewed and are negative.    PHYSICAL EXAM: VS:  BP (!) 130/56 (BP Location: Left Arm, Patient Position: Sitting, Cuff Size: Normal)   Pulse 61   Ht 5' 4.5" (1.638 m)   Wt 141 lb (64 kg)   LMP 11/20/1976   SpO2 98%   BMI 23.83 kg/m  , BMI Body mass index is 23.83 kg/m. Constitutional:  oriented to person, place, and time. No distress.  HENT:  Head: Grossly normal Eyes:  no discharge. No scleral icterus.  Neck: No JVD, no carotid bruits  Cardiovascular: Regular rate and rhythm, no murmurs appreciated Pulmonary/Chest: Clear to auscultation bilaterally, no wheezes or rails Abdominal: Soft.  no distension.  no tenderness.  Musculoskeletal: Normal range of motion Neurological:   normal muscle tone. Coordination normal. No atrophy Skin: Skin warm and dry Psychiatric: normal affect, pleasant   Recent Labs: 02/09/2023: Magnesium 2.0 05/12/2023: Hemoglobin 11.8; Platelets 240.0 05/27/2023: ALT 8; BUN 9; Creatinine, Ser 0.84; Potassium 4.4; Sodium 132; TSH 2.80    Lipid Panel Lab Results  Component Value Date   CHOL 140 03/05/2023   HDL 54.70 03/05/2023   LDLCALC 70 03/05/2023   TRIG 79.0 03/05/2023      Wt Readings from Last 3 Encounters:  06/23/23 141 lb (64 kg)  05/12/23 136 lb (61.7 kg)  04/24/23 143 lb 9.6 oz (65.1 kg)       ASSESSMENT AND PLAN:  Problem List Items Addressed This Visit       Cardiology Problems   Essential hypertension   Relevant Orders   EKG 12-Lead (Completed)   Aortic atherosclerosis (HCC)   Relevant Orders   EKG 12-Lead (Completed)   PAF (paroxysmal atrial fibrillation) (HCC) - Primary   Relevant Orders   EKG 12-Lead (Completed)     Other   SOB (shortness of breath)   Other Visit Diagnoses     Coronary artery calcification seen on CT scan       Relevant Orders   EKG 12-Lead (Completed)   Precordial chest pain       Mixed hyperlipidemia       Palpitations       Bilateral carotid bruits           Near syncope No recent symptoms In retrospect could be secondary to arrhythmia  Paroxysmal atrial fibrillation Recommend she continue on Eliquis 5 twice daily, Coreg 25 twice daily Maintaining normal sinus rhythm  Aortic atherosclerosis CT scan images showing mild aortic atherosclerosis no significant coronary calcification Continue lovastatin  Chest pain No recent episodes of chest pain  Essential hypertension Blood pressure is well controlled on today's visit. No changes made to the medications.    Total encounter time more than 30 minutes  Greater than 50% was spent in counseling and coordination of care with the patient    Signed, Dossie Arbour, M.D., Ph.D. [facility] [place],  [place] [phone]

## 2023-06-23 ENCOUNTER — Ambulatory Visit: Payer: Medicare PPO | Attending: Cardiovascular Disease | Admitting: Cardiovascular Disease

## 2023-06-23 ENCOUNTER — Encounter: Payer: Self-pay | Admitting: Cardiovascular Disease

## 2023-06-23 VITALS — BP 130/56 | HR 61 | Ht 64.5 in | Wt 141.0 lb

## 2023-06-23 DIAGNOSIS — R002 Palpitations: Secondary | ICD-10-CM

## 2023-06-23 DIAGNOSIS — R072 Precordial pain: Secondary | ICD-10-CM

## 2023-06-23 DIAGNOSIS — I2510 Atherosclerotic heart disease of native coronary artery without angina pectoris: Secondary | ICD-10-CM | POA: Diagnosis not present

## 2023-06-23 DIAGNOSIS — I10 Essential (primary) hypertension: Secondary | ICD-10-CM | POA: Diagnosis not present

## 2023-06-23 DIAGNOSIS — R0989 Other specified symptoms and signs involving the circulatory and respiratory systems: Secondary | ICD-10-CM | POA: Diagnosis not present

## 2023-06-23 DIAGNOSIS — I700 Atherosclerosis of aorta: Secondary | ICD-10-CM

## 2023-06-23 DIAGNOSIS — I480 Paroxysmal atrial fibrillation: Secondary | ICD-10-CM | POA: Diagnosis not present

## 2023-06-23 DIAGNOSIS — E782 Mixed hyperlipidemia: Secondary | ICD-10-CM | POA: Diagnosis not present

## 2023-06-23 DIAGNOSIS — R0602 Shortness of breath: Secondary | ICD-10-CM

## 2023-06-23 NOTE — Patient Instructions (Signed)

## 2023-06-26 ENCOUNTER — Other Ambulatory Visit: Payer: Medicare PPO

## 2023-06-26 ENCOUNTER — Telehealth: Payer: Self-pay | Admitting: Internal Medicine

## 2023-06-26 NOTE — Telephone Encounter (Signed)
Copied from CRM [id number]. Topic: Medicare AWV >> Jun 26, 2023  2:01 PM Payton Doughty wrote: Reason for CRM: Called 06/26/2023 to sched AWV - NO VOICEMAIL  Verlee Rossetti; Care Guide Ambulatory Clinical Support [facility] Direct Dial: [phone]

## 2023-07-14 NOTE — Progress Notes (Unsigned)
07/15/2023 8:29 AM   Rachael Austin 02-25-1941 [id number]  Referring provider: Dale , MD [address] [place],  [place] [address]  Urological history: 1. OAB -Contributing factors of age, GSM, hypertension, detrusor dyssynergia, hyperglycemia, COVID, constipation, antihistamines and smoking history -trospium IR 20 mg   2. IC  -contributing factors of age, GSM and constipation -Imipramine 10 mg daily -cannot take due to QT prolongation  No chief complaint on file.  HPI: Rachael Austin is a 82 y.o. female who presents today UA recheck and symptoms recheck.    Previous records reviewed.   At her visit on 05/13/2023, she is having 1-7 daytime voids with 3 or more episodes of nocturia with a severe urge to urinate.  She is having urge incontinence.  She is leaking 3 more times a day.  She wears 3-4 absorbent pads daily.  She does not limit fluid intake.  She does engage in toilet mapping.  She has been having cloudy urine and dysuria and worsening of her urinary continence since she came out of the hospital from her hip replacement in June.  Patient denies any modifying or aggravating factors.  Patient denies any recent UTI's, gross hematuria, dysuria or suprapubic/flank pain.  Patient denies any fevers, chills, nausea or vomiting.  UA yellow cloudy, specific gravity 1.015, trace heme, pH 6.5, 2+ protein, 3+ leukocytes, greater than 30 WBCs, 3-10 RBCs, mucus threads are present and many bacteria.  Urine culture  MUF.   PVR 109 mL.  UA ***  PVR ***  PMH: Past Medical History:  Diagnosis Date   AK (actinic keratosis) 11/12/2022   left upper arm, tx'd with EDC   AK (actinic keratosis) 11/12/2022   right medial pretibia, LN2 11/25/22   Basal cell carcinoma 06/21/2020   left post shoulder sup, left mid chest, left lat thigh   Basal cell carcinoma 11/07/2021   L upper back, EDC   Basal cell carcinoma 11/07/2021   R upper back, EDC   Basal  cell carcinoma 11/27/2021   right upper arm, EDC   Basal cell carcinoma 11/27/2021   right shoulder posterior, EDC   Basal cell carcinoma 11/27/2021   right anterior shoulder, EDC   Basal cell carcinoma (BCC) 06/13/2021   left dorsal foot, EDC 10/02/2021   Basal cell carcinoma (BCC) 06/13/2021   right postauricular tx'd w/ EDC   BCC (basal cell carcinoma of skin) 03/30/2007   R inf med pretibial - BCC   BCC (basal cell carcinoma of skin) 10/12/2013   L nasal ala - BCC   BCC (basal cell carcinoma of skin) 03/10/2018   L mid dorsum med forearm - superficial BCC    BCC (basal cell carcinoma) 10/02/2021   left dorsal foot proximal, EDC 10/02/2021   BCC (basal cell carcinoma) 10/02/2021   left mid back, superficial EDC 11/07/21   BCC (basal cell carcinoma) 10/02/2021   right pretibia   BCC (basal cell carcinoma) 10/02/2021   right mid back, superficial EDC 11/07/21   BCC (basal cell carcinoma) 08/14/2022   left distal calf, tx'd with EDC   BCC (basal cell carcinoma) 11/12/2022   superficial at left lateral pretibial,l tx'd with EDC   BCC (basal cell carcinoma) 11/12/2022   superficial at mid back right of midline, scheduled for EDC   BCC (basal cell carcinoma) 11/12/2022   mid back right of midline, EDC 11/25/22   Breast screening, unspecified 2013   Cancer (HCC) 1992   skin  Coronary artery calcification seen on CT scan    Degenerative disk disease    Diastolic dysfunction    a. 10/2021 Echo: EF 60-65%, no rwma, GrI DD, mild MR; b. 01/2023 Echo: EF 65-70%, no rwma, mild LVH, GrI DD Nl RV fxn. Triv MR. Mild-mod TR. AoV sclerosis.   GERD (gastroesophageal reflux disease)    History of basal cell carcinoma (BCC) 08/29/2020    left inferior knee lateral, , left inferior knee medial, right pretibia   History of SCC (squamous cell carcinoma) of skin 08/29/2020   right posterior calf, left lateral calf, and    Hypercholesterolemia 2008   Interstitial cystitis    followed by Dr Achilles Dunk   Near  syncope    Other sign and symptom in breast 2013   Left upper outer quadrant breast "soreness" Ultrasound exam of right breast in the 2 o'clock position with the breast distracted medially showed a 0.3-0.4 with 0.5 cm simple cyst. In the 1 o'clock position where pt reported tenderness US exam was negatiive.  Because of her history of intermittent nipple drainage, ultrasound was completed of the retroareolar area.   PAF (paroxysmal atrial fibrillation) (HCC)    a. dx 01/2023-->Eliquis 5 BID (CHA2DS2VASc = 5).   Personal history of tobacco use, presenting hazards to health    PONV (postoperative nausea and vomiting)    Primary hypertension    SCC (squamous cell carcinoma) 03/28/2008   R dorsum hand - SCC   SCC (squamous cell carcinoma) 05/09/2009   R lat lower leg - SCCIS   SCC (squamous cell carcinoma) 05/09/2009   L lat lower leg - SCCIS   SCC (squamous cell carcinoma) 04/07/2013   L lower leg - SCCIS   SCC (squamous cell carcinoma) 04/27/2013   R forearm - SCC   SCC (squamous cell carcinoma) 04/28/2017   L lat knee - SCCIS   SCC (squamous cell carcinoma) 05/12/2018   L prox dorsum forearm - SCC   SCC (squamous cell carcinoma) 06/13/2021   left forearm tx'd w/ EDC   SCC (squamous cell carcinoma), leg, right 03/30/2007   R sup pretibial - SCCIS   SCC (squamous cell carcinoma);BCC 10/12/2013   Mid back - superficial BCC with SCCIS    Special screening for malignant neoplasms, colon    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   left dorsal forearm, left lat calf   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   left lower leg superior, EDC at follow up   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   chest right of midline, tx'd with [facility]    Surgical History: Past Surgical History:  Procedure Laterality Date   ABDOMINAL HYSTERECTOMY  1992   APPENDECTOMY     CERVICAL DISCECTOMY     S/P C7-T1 discectomy with fusion   CHOLECYSTECTOMY  1990   COLONOSCOPY WITH PROPOFOL  N/A 02/14/2016   Procedure: COLONOSCOPY WITH PROPOFOL;  Surgeon: Midge Minium, MD;  Location: [facility];  Service: Endoscopy;  Laterality: N/A;  PT WOULD LIKE 10 ARRIVAL TIME OR LATER   DILATION AND CURETTAGE OF UTERUS     ESOPHAGOGASTRODUODENOSCOPY (EGD) WITH PROPOFOL N/A 02/14/2016   Procedure: ESOPHAGOGASTRODUODENOSCOPY (EGD) WITH PROPOFOL with dialtion;  Surgeon: Midge Minium, MD;  Location: [facility];  Service: Endoscopy;  Laterality: N/A;   ESOPHAGOGASTRODUODENOSCOPY (EGD) WITH PROPOFOL N/A 04/13/2019   Procedure: ESOPHAGOGASTRODUODENOSCOPY (EGD) WITH PROPOFOL;  Surgeon: Toledo, Boykin Nearing, MD;  Location: [facility];  Service: Gastroenterology;  Laterality: N/A;  HIP PINNING,CANNULATED Left 02/02/2023   Procedure: PERCUTANEOUS FIXATION OF FEMORAL NECK;  Surgeon: Juanell Fairly, MD;  Location: [facility];  Service: Orthopedics;  Laterality: Left;   MELANOMA EXCISION     removed from Left calf 1994    Home Medications:  Allergies as of 07/15/2023       Reactions   Augmentin [amoxicillin-pot Clavulanate] Swelling, Rash   Swelling of the lips   Lexapro [escitalopram Oxalate]    Micardis [telmisartan] Itching   Myrbetriq [mirabegron] Itching   Niacin And Related Itching   Codeine Sulfate Other (See Comments)   "Makes her hyper"   Vesicare [solifenacin Succinate] Nausea And Vomiting, Rash        Medication List        Accurate as of July 14, 2023  8:29 AM. If you have any questions, ask your nurse or doctor.          acetaminophen 325 MG tablet Commonly known as: TYLENOL Take 650 mg by mouth as needed.   apixaban 5 MG Tabs tablet Commonly known as: ELIQUIS Take 1 tablet (5 mg total) by mouth 2 (two) times daily.   carvedilol 25 MG tablet Commonly known as: COREG Take 1 tablet (25 mg total) by mouth 2 (two) times daily with a meal.   cefUROXime 500 MG tablet Commonly known as: CEFTIN Take 1 tablet (500 mg total) by mouth 2 (two) times daily  with a meal.   esomeprazole 40 MG capsule Commonly known as: NEXIUM Take 1 capsule by mouth once daily   hydrALAZINE 25 MG tablet Commonly known as: APRESOLINE Take 1 tablet (25 mg total) by mouth 2 (two) times daily.   imipramine 10 MG tablet Commonly known as: TOFRANIL Take 10 mg by mouth at bedtime.   lovastatin 20 MG tablet Commonly known as: MEVACOR Take 1 tablet (20 mg total) by mouth at bedtime.   sertraline 50 MG tablet Commonly known as: ZOLOFT Take 1 tablet (50 mg total) by mouth daily.   trospium 20 MG tablet Commonly known as: SANCTURA Take 1 tablet (20 mg total) by mouth daily.        Allergies:  Allergies  Allergen Reactions   Augmentin [Amoxicillin-Pot Clavulanate] Swelling and Rash    Swelling of the lips   Lexapro [Escitalopram Oxalate]    Micardis [Telmisartan] Itching   Myrbetriq [Mirabegron] Itching   Niacin And Related Itching   Codeine Sulfate Other (See Comments)    "Makes her hyper"   Vesicare [Solifenacin Succinate] Nausea And Vomiting and Rash    Family History: Family History  Problem Relation Age of Onset   Hodgkin's lymphoma Mother    Heart failure Father    Heart attack Father    Arthritis Sister        Three sisters w/ degeneratve disk disease   Headache Sister        Two sisters hx of headache   Breast cancer Neg Hx    Colon cancer Neg Hx    Bladder Cancer Neg Hx    Kidney cancer Neg Hx     Social History:  reports that she has quit smoking. Her smoking use included cigarettes. She has a 15 pack-year smoking history. She has never used smokeless tobacco. She reports that she does not drink alcohol and does not use drugs.  ROS: Pertinent ROS in HPI  Physical Exam: LMP 11/20/1976   Constitutional:  Well nourished. Alert and oriented, No acute distress. HEENT: [place] AT, moist mucus membranes.  Trachea midline,  no masses. Cardiovascular: No clubbing, cyanosis, or edema. Respiratory: Normal respiratory effort, no increased  work of breathing. GU: No CVA tenderness.  No bladder fullness or masses.  Recession of labia minora, dry, pale vulvar vaginal mucosa and loss of mucosal ridges and folds.  Normal urethral meatus, no lesions, no prolapse, no discharge.   No urethral masses, tenderness and/or tenderness. No bladder fullness, tenderness or masses. *** vagina mucosa, *** estrogen effect, no discharge, no lesions, *** pelvic support, *** cystocele and *** rectocele noted.  No cervical motion tenderness.  Uterus is freely mobile and non-fixed.  No adnexal/parametria masses or tenderness noted.  Anus and perineum are without rashes or lesions.   ***  Neurologic: Grossly intact, no focal deficits, moving all 4 extremities. Psychiatric: Normal mood and affect.    Laboratory Data: Lab Results  Component Value Date   CREATININE 0.84 05/27/2023    Lab Results  Component Value Date   TSH 2.80 05/27/2023    Lab Results  Component Value Date   AST 14 05/27/2023   Lab Results  Component Value Date   ALT 8 05/27/2023    Urinalysis See HPI and EPIC  I have reviewed the labs.   Pertinent Imaging: ***  Assessment & Plan:    1. OAB wet -She has had a worsening of her urinary symptoms over the past 2 months -UA with pyuria, hematuria and bacteriuria -PVR demonstrates adequate emptying -Continue trospium IR 20 mg daily -I will send the urine for culture and start her empirically on Ceftin 500 mg twice daily  2. IC -Worsening symptoms -Cannot be on imipramine due to her history of QT prolongation  3. Microscopic hematuria -UA w/ micro heme -Will need to check her urine in 2 months to make sure the hematuria clears with treatment of the infection, if urine culture is negative we will need to pursue hematuria workup  No follow-ups on file.  These notes generated with voice recognition software. I apologize for typographical errors.  Cloretta Ned  [facility] [address]  [address] [place], [place] [address] [phone]

## 2023-07-15 ENCOUNTER — Ambulatory Visit: Payer: Medicare PPO | Admitting: Urology

## 2023-07-15 ENCOUNTER — Encounter: Payer: Self-pay | Admitting: Urology

## 2023-07-15 VITALS — BP 158/84 | HR 75 | Ht 64.0 in | Wt 148.0 lb

## 2023-07-15 DIAGNOSIS — N3281 Overactive bladder: Secondary | ICD-10-CM | POA: Diagnosis not present

## 2023-07-15 DIAGNOSIS — N952 Postmenopausal atrophic vaginitis: Secondary | ICD-10-CM

## 2023-07-15 DIAGNOSIS — N3010 Interstitial cystitis (chronic) without hematuria: Secondary | ICD-10-CM | POA: Diagnosis not present

## 2023-07-15 DIAGNOSIS — R3129 Other microscopic hematuria: Secondary | ICD-10-CM

## 2023-07-15 LAB — URINALYSIS, COMPLETE
Bilirubin, UA: NEGATIVE
Glucose, UA: NEGATIVE
Ketones, UA: NEGATIVE
Nitrite, UA: NEGATIVE
Protein,UA: NEGATIVE
RBC, UA: NEGATIVE
Specific Gravity, UA: 1.015 (ref 1.005–1.030)
Urobilinogen, Ur: 0.2 mg/dL (ref 0.2–1.0)
pH, UA: 7 (ref 5.0–7.5)

## 2023-07-15 LAB — MICROSCOPIC EXAMINATION

## 2023-07-15 LAB — BLADDER SCAN AMB NON-IMAGING: Scan Result: 0

## 2023-07-15 MED ORDER — PREMARIN 0.625 MG/GM VA CREA: 1.0000 | TOPICAL_CREAM | Freq: Every day | VAGINAL | 12 refills | Status: AC

## 2023-07-15 NOTE — Patient Instructions (Signed)
Using your finger-tip apply a pea-sized amount of the Premarin cream to the external vaginal area on Monday, Wednesday and Friday nights

## 2023-07-21 ENCOUNTER — Ambulatory Visit: Payer: Medicare PPO

## 2023-07-21 ENCOUNTER — Ambulatory Visit: Payer: Medicare PPO | Admitting: Internal Medicine

## 2023-07-21 ENCOUNTER — Encounter: Payer: Self-pay | Admitting: Internal Medicine

## 2023-07-21 VITALS — BP 130/70 | HR 75 | Temp 98.2°F | Resp 16 | Ht 64.0 in | Wt 141.0 lb

## 2023-07-21 DIAGNOSIS — K219 Gastro-esophageal reflux disease without esophagitis: Secondary | ICD-10-CM

## 2023-07-21 DIAGNOSIS — E538 Deficiency of other specified B group vitamins: Secondary | ICD-10-CM

## 2023-07-21 DIAGNOSIS — L659 Nonscarring hair loss, unspecified: Secondary | ICD-10-CM | POA: Diagnosis not present

## 2023-07-21 DIAGNOSIS — J189 Pneumonia, unspecified organism: Secondary | ICD-10-CM | POA: Diagnosis not present

## 2023-07-21 DIAGNOSIS — F439 Reaction to severe stress, unspecified: Secondary | ICD-10-CM

## 2023-07-21 DIAGNOSIS — Z23 Encounter for immunization: Secondary | ICD-10-CM | POA: Diagnosis not present

## 2023-07-21 DIAGNOSIS — L989 Disorder of the skin and subcutaneous tissue, unspecified: Secondary | ICD-10-CM

## 2023-07-21 DIAGNOSIS — R9389 Abnormal findings on diagnostic imaging of other specified body structures: Secondary | ICD-10-CM | POA: Diagnosis not present

## 2023-07-21 DIAGNOSIS — D649 Anemia, unspecified: Secondary | ICD-10-CM

## 2023-07-21 DIAGNOSIS — I480 Paroxysmal atrial fibrillation: Secondary | ICD-10-CM

## 2023-07-21 DIAGNOSIS — E7800 Pure hypercholesterolemia, unspecified: Secondary | ICD-10-CM | POA: Diagnosis not present

## 2023-07-21 DIAGNOSIS — R739 Hyperglycemia, unspecified: Secondary | ICD-10-CM

## 2023-07-21 DIAGNOSIS — I10 Essential (primary) hypertension: Secondary | ICD-10-CM

## 2023-07-21 DIAGNOSIS — I700 Atherosclerosis of aorta: Secondary | ICD-10-CM

## 2023-07-21 DIAGNOSIS — Z85820 Personal history of malignant melanoma of skin: Secondary | ICD-10-CM

## 2023-07-21 LAB — IBC + FERRITIN
Ferritin: 16.7 ng/mL (ref 10.0–291.0)
Iron: 70 ug/dL (ref 42–145)
Saturation Ratios: 18 % — ABNORMAL LOW (ref 20.0–50.0)
TIBC: 389.2 ug/dL (ref 250.0–450.0)
Transferrin: 278 mg/dL (ref 212.0–360.0)

## 2023-07-21 LAB — CBC WITH DIFFERENTIAL/PLATELET
Basophils Absolute: 0.1 10*3/uL (ref 0.0–0.1)
Basophils Relative: 0.9 % (ref 0.0–3.0)
Eosinophils Absolute: 0.1 10*3/uL (ref 0.0–0.7)
Eosinophils Relative: 1.1 % (ref 0.0–5.0)
HCT: 36.5 % (ref 36.0–46.0)
Hemoglobin: 12 g/dL (ref 12.0–15.0)
Lymphocytes Relative: 22.9 % (ref 12.0–46.0)
Lymphs Abs: 1.5 10*3/uL (ref 0.7–4.0)
MCHC: 32.9 g/dL (ref 30.0–36.0)
MCV: 87.4 fL (ref 78.0–100.0)
Monocytes Absolute: 0.6 10*3/uL (ref 0.1–1.0)
Monocytes Relative: 8.4 % (ref 3.0–12.0)
Neutro Abs: 4.4 10*3/uL (ref 1.4–7.7)
Neutrophils Relative %: 66.7 % (ref 43.0–77.0)
Platelets: 208 10*3/uL (ref 150.0–400.0)
RBC: 4.18 Mil/uL (ref 3.87–5.11)
RDW: 16.7 % — ABNORMAL HIGH (ref 11.5–15.5)
WBC: 6.6 10*3/uL (ref 4.0–10.5)

## 2023-07-21 LAB — HEPATIC FUNCTION PANEL
ALT: 8 U/L (ref 0–35)
AST: 15 U/L (ref 0–37)
Albumin: 4.3 g/dL (ref 3.5–5.2)
Alkaline Phosphatase: 71 U/L (ref 39–117)
Bilirubin, Direct: 0.1 mg/dL (ref 0.0–0.3)
Total Bilirubin: 0.6 mg/dL (ref 0.2–1.2)
Total Protein: 6.8 g/dL (ref 6.0–8.3)

## 2023-07-21 LAB — LIPID PANEL
Cholesterol: 166 mg/dL (ref 0–200)
HDL: 61 mg/dL (ref >39.00)
LDL Cholesterol: 91 mg/dL (ref 0–99)
NonHDL: 105.45
Total CHOL/HDL Ratio: 3
Triglycerides: 72 mg/dL (ref 0.0–149.0)
VLDL: 14.4 mg/dL (ref 0.0–40.0)

## 2023-07-21 LAB — BASIC METABOLIC PANEL
BUN: 10 mg/dL (ref 6–23)
CO2: 27 meq/L (ref 19–32)
Calcium: 9.2 mg/dL (ref 8.4–10.5)
Chloride: 101 meq/L (ref 96–112)
Creatinine, Ser: 0.96 mg/dL (ref 0.40–1.20)
GFR: 54.71 mL/min — ABNORMAL LOW (ref >60.00)
Glucose, Bld: 107 mg/dL — ABNORMAL HIGH (ref 70–99)
Potassium: 4.5 meq/L (ref 3.5–5.1)
Sodium: 136 meq/L (ref 135–145)

## 2023-07-21 LAB — TSH: TSH: 3.29 u[IU]/mL (ref 0.35–5.50)

## 2023-07-21 LAB — HEMOGLOBIN A1C: Hgb A1c MFr Bld: 6 % (ref 4.6–6.5)

## 2023-07-21 MED ORDER — LOVASTATIN 20 MG PO TABS: 20.0000 mg | ORAL_TABLET | Freq: Every day | ORAL | 1 refills | Status: DC

## 2023-07-21 MED ORDER — CYANOCOBALAMIN 1000 MCG/ML IJ SOLN
1000.0000 ug | Freq: Once | INTRAMUSCULAR | Status: AC
Start: 2023-07-21 — End: 2023-07-21
  Administered 2023-07-21: 1000 ug via INTRAMUSCULAR

## 2023-07-21 MED ORDER — SERTRALINE HCL 50 MG PO TABS: 50.0000 mg | ORAL_TABLET | Freq: Every day | ORAL | 1 refills | Status: DC

## 2023-07-21 NOTE — Progress Notes (Signed)
Signed   By: Lupita Raider M.D.   On: 02/04/2023 12:55   ECHOCARDIOGRAM COMPLETE  Result Date: 02/04/2023    ECHOCARDIOGRAM REPORT   Patient Name:   Rachael Austin [facility] Date of Exam: 02/03/2023 Medical Rec #:  [id number]          Height:       64.0 in Accession #:    [id number]         Weight:       142.0 lb Date of Birth:  June 04, 1941          BSA:          1.691 m Patient Age:    82 years           BP:           159/63 mmHg Patient Gender: F                  HR:           115 bpm. Exam Location:  [facility] Procedure: 2D Echo, Cardiac Doppler and Color Doppler Indications:     R07.9 Chest pain  History:         Patient has prior history of Echocardiogram examinations, most                  recent 11/21/2021.  Sonographer:     Daphine Deutscher RDCS Referring Phys:  [id number] Rachael Austin Diagnosing Phys: Rachael Kendall MD IMPRESSIONS  1. Left ventricular ejection fraction, by estimation, is 65 to 70%. The left ventricle has normal  function. The left ventricle has no regional wall motion abnormalities. There is mild left ventricular hypertrophy. Left ventricular diastolic parameters are consistent with Grade I diastolic dysfunction (impaired relaxation). Elevated left atrial pressure.  2. Right ventricular systolic function is mildly reduced. The right ventricular size is normal. There is normal pulmonary artery systolic pressure.  3. The mitral valve is normal in structure. Trivial mitral valve regurgitation. No evidence of mitral stenosis.  4. Tricuspid valve regurgitation is mild to moderate.  5. The aortic valve has an indeterminant number of cusps. There is mild calcification of the aortic valve. There is mild thickening of the aortic valve. Aortic valve regurgitation is not visualized. Aortic valve sclerosis/calcification is present, without any evidence of aortic stenosis.  6. The inferior vena cava is normal in size with <50% respiratory variability, suggesting right atrial pressure of 8 mmHg. FINDINGS  Left Ventricle: Left ventricular ejection fraction, by estimation, is 65 to 70%. The left ventricle has normal function. The left ventricle has no regional wall motion abnormalities. The left ventricular internal cavity size was normal in size. There is  mild left ventricular hypertrophy. Left ventricular diastolic parameters are consistent with Grade I diastolic dysfunction (impaired relaxation). Elevated left atrial pressure. Right Ventricle: The right ventricular size is normal. No increase in right ventricular wall thickness. Right ventricular systolic function is mildly reduced. There is normal pulmonary artery systolic pressure. The tricuspid regurgitant velocity is 2.50 m/s, and with an assumed right atrial pressure of 8 mmHg, the estimated right ventricular systolic pressure is 33.0 mmHg. Left Atrium: Left atrial size was normal in size. Right Atrium: Right atrial size was normal in size. Pericardium: There is no evidence of  pericardial effusion. Mitral Valve: The mitral valve is normal in structure. Trivial mitral valve regurgitation. No evidence of mitral valve stenosis. Tricuspid Valve: The tricuspid valve is normal in structure. Tricuspid valve regurgitation is mild to moderate. Aortic Valve:  Subjective:    [name], female    DOB: 11-Jul-1941, 82 y.o.   MRN: [id number]  Patient here for  Chief Complaint  Patient presents with   Medical Management of Chronic Issues    HPI Here for a scheduled follow up. Here to follow up regarding hypercholesterolemia, hypertension and afib. Recently admitted 02/01/23 - with left femur fracture.  Is S/p percutaneous fixation of left femoral neck hip fracture on 02/02/2023.  Continues f/u with ortho. Saw ortho 7//24 - left distal radius fracture and nondisplaced fracture of the middle phalanx. Overall doing better.  Evaluated by urology 07/15/23 - continue trospium for OAB.  Placed on ceftin for possible UTI. Recommended trial of vaginal estrogen. Had f/u with cardiology 06/23/23 - recommended continuing eliquis and coreg. Overall doing better.  Walking. Persistent right leg lesion. Request referral to dermatology. Discussed needs f/u cxr - f/u for clearance.    Past Medical History:  Diagnosis Date   AK (actinic keratosis) 11/12/2022   left upper arm, tx'd with EDC   AK (actinic keratosis) 11/12/2022   right medial pretibia, LN2 11/25/22   Basal cell carcinoma 06/21/2020   left post shoulder sup, left mid chest, left lat thigh   Basal cell carcinoma 11/07/2021   L upper back, EDC   Basal cell carcinoma 11/07/2021   R upper back, EDC   Basal cell carcinoma 11/27/2021   right upper arm, EDC   Basal cell carcinoma 11/27/2021   right shoulder posterior, EDC   Basal cell carcinoma 11/27/2021   right anterior shoulder, EDC   Basal cell carcinoma (BCC) 06/13/2021   left dorsal foot, EDC 10/02/2021   Basal cell carcinoma (BCC) 06/13/2021   right postauricular tx'd w/ EDC   BCC (basal cell carcinoma of skin) 03/30/2007   R inf med pretibial - BCC   BCC (basal cell carcinoma of skin) 10/12/2013   L nasal ala - BCC   BCC (basal cell carcinoma of skin) 03/10/2018   L mid dorsum med forearm - superficial BCC    BCC (basal cell  carcinoma) 10/02/2021   left dorsal foot proximal, EDC 10/02/2021   BCC (basal cell carcinoma) 10/02/2021   left mid back, superficial EDC 11/07/21   BCC (basal cell carcinoma) 10/02/2021   right pretibia   BCC (basal cell carcinoma) 10/02/2021   right mid back, superficial EDC 11/07/21   BCC (basal cell carcinoma) 08/14/2022   left distal calf, tx'd with EDC   BCC (basal cell carcinoma) 11/12/2022   superficial at left lateral pretibial,l tx'd with EDC   BCC (basal cell carcinoma) 11/12/2022   superficial at mid back right of midline, scheduled for EDC   BCC (basal cell carcinoma) 11/12/2022   mid back right of midline, [facility] 11/25/22   Breast screening, unspecified 2013   Cancer (HCC) 1992   skin   Coronary artery calcification seen on CT scan    Degenerative disk disease    Diastolic dysfunction    a. 10/2021 Echo: EF 60-65%, no rwma, GrI DD, mild MR; b. 01/2023 Echo: EF 65-70%, no rwma, mild LVH, GrI DD Nl RV fxn. Triv MR. Mild-mod TR. AoV sclerosis.   GERD (gastroesophageal reflux disease)    History of basal cell carcinoma (BCC) 08/29/2020    left inferior knee lateral, , left inferior knee medial, right pretibia   History of SCC (squamous cell carcinoma) of skin 08/29/2020   right posterior calf, left lateral calf, and    Hypercholesterolemia 2008  Subjective:    [name], female    DOB: 11-Jul-1941, 82 y.o.   MRN: [id number]  Patient here for  Chief Complaint  Patient presents with   Medical Management of Chronic Issues    HPI Here for a scheduled follow up. Here to follow up regarding hypercholesterolemia, hypertension and afib. Recently admitted 02/01/23 - with left femur fracture.  Is S/p percutaneous fixation of left femoral neck hip fracture on 02/02/2023.  Continues f/u with ortho. Saw ortho 7//24 - left distal radius fracture and nondisplaced fracture of the middle phalanx. Overall doing better.  Evaluated by urology 07/15/23 - continue trospium for OAB.  Placed on ceftin for possible UTI. Recommended trial of vaginal estrogen. Had f/u with cardiology 06/23/23 - recommended continuing eliquis and coreg. Overall doing better.  Walking. Persistent right leg lesion. Request referral to dermatology. Discussed needs f/u cxr - f/u for clearance.    Past Medical History:  Diagnosis Date   AK (actinic keratosis) 11/12/2022   left upper arm, tx'd with EDC   AK (actinic keratosis) 11/12/2022   right medial pretibia, LN2 11/25/22   Basal cell carcinoma 06/21/2020   left post shoulder sup, left mid chest, left lat thigh   Basal cell carcinoma 11/07/2021   L upper back, EDC   Basal cell carcinoma 11/07/2021   R upper back, EDC   Basal cell carcinoma 11/27/2021   right upper arm, EDC   Basal cell carcinoma 11/27/2021   right shoulder posterior, EDC   Basal cell carcinoma 11/27/2021   right anterior shoulder, EDC   Basal cell carcinoma (BCC) 06/13/2021   left dorsal foot, EDC 10/02/2021   Basal cell carcinoma (BCC) 06/13/2021   right postauricular tx'd w/ EDC   BCC (basal cell carcinoma of skin) 03/30/2007   R inf med pretibial - BCC   BCC (basal cell carcinoma of skin) 10/12/2013   L nasal ala - BCC   BCC (basal cell carcinoma of skin) 03/10/2018   L mid dorsum med forearm - superficial BCC    BCC (basal cell  carcinoma) 10/02/2021   left dorsal foot proximal, EDC 10/02/2021   BCC (basal cell carcinoma) 10/02/2021   left mid back, superficial EDC 11/07/21   BCC (basal cell carcinoma) 10/02/2021   right pretibia   BCC (basal cell carcinoma) 10/02/2021   right mid back, superficial EDC 11/07/21   BCC (basal cell carcinoma) 08/14/2022   left distal calf, tx'd with EDC   BCC (basal cell carcinoma) 11/12/2022   superficial at left lateral pretibial,l tx'd with EDC   BCC (basal cell carcinoma) 11/12/2022   superficial at mid back right of midline, scheduled for EDC   BCC (basal cell carcinoma) 11/12/2022   mid back right of midline, [facility] 11/25/22   Breast screening, unspecified 2013   Cancer (HCC) 1992   skin   Coronary artery calcification seen on CT scan    Degenerative disk disease    Diastolic dysfunction    a. 10/2021 Echo: EF 60-65%, no rwma, GrI DD, mild MR; b. 01/2023 Echo: EF 65-70%, no rwma, mild LVH, GrI DD Nl RV fxn. Triv MR. Mild-mod TR. AoV sclerosis.   GERD (gastroesophageal reflux disease)    History of basal cell carcinoma (BCC) 08/29/2020    left inferior knee lateral, , left inferior knee medial, right pretibia   History of SCC (squamous cell carcinoma) of skin 08/29/2020   right posterior calf, left lateral calf, and    Hypercholesterolemia 2008  Signed   By: Lupita Raider M.D.   On: 02/04/2023 12:55   ECHOCARDIOGRAM COMPLETE  Result Date: 02/04/2023    ECHOCARDIOGRAM REPORT   Patient Name:   Rachael Austin [facility] Date of Exam: 02/03/2023 Medical Rec #:  [id number]          Height:       64.0 in Accession #:    [id number]         Weight:       142.0 lb Date of Birth:  June 04, 1941          BSA:          1.691 m Patient Age:    82 years           BP:           159/63 mmHg Patient Gender: F                  HR:           115 bpm. Exam Location:  [facility] Procedure: 2D Echo, Cardiac Doppler and Color Doppler Indications:     R07.9 Chest pain  History:         Patient has prior history of Echocardiogram examinations, most                  recent 11/21/2021.  Sonographer:     Daphine Deutscher RDCS Referring Phys:  [id number] Rachael Austin Diagnosing Phys: Rachael Kendall MD IMPRESSIONS  1. Left ventricular ejection fraction, by estimation, is 65 to 70%. The left ventricle has normal  function. The left ventricle has no regional wall motion abnormalities. There is mild left ventricular hypertrophy. Left ventricular diastolic parameters are consistent with Grade I diastolic dysfunction (impaired relaxation). Elevated left atrial pressure.  2. Right ventricular systolic function is mildly reduced. The right ventricular size is normal. There is normal pulmonary artery systolic pressure.  3. The mitral valve is normal in structure. Trivial mitral valve regurgitation. No evidence of mitral stenosis.  4. Tricuspid valve regurgitation is mild to moderate.  5. The aortic valve has an indeterminant number of cusps. There is mild calcification of the aortic valve. There is mild thickening of the aortic valve. Aortic valve regurgitation is not visualized. Aortic valve sclerosis/calcification is present, without any evidence of aortic stenosis.  6. The inferior vena cava is normal in size with <50% respiratory variability, suggesting right atrial pressure of 8 mmHg. FINDINGS  Left Ventricle: Left ventricular ejection fraction, by estimation, is 65 to 70%. The left ventricle has normal function. The left ventricle has no regional wall motion abnormalities. The left ventricular internal cavity size was normal in size. There is  mild left ventricular hypertrophy. Left ventricular diastolic parameters are consistent with Grade I diastolic dysfunction (impaired relaxation). Elevated left atrial pressure. Right Ventricle: The right ventricular size is normal. No increase in right ventricular wall thickness. Right ventricular systolic function is mildly reduced. There is normal pulmonary artery systolic pressure. The tricuspid regurgitant velocity is 2.50 m/s, and with an assumed right atrial pressure of 8 mmHg, the estimated right ventricular systolic pressure is 33.0 mmHg. Left Atrium: Left atrial size was normal in size. Right Atrium: Right atrial size was normal in size. Pericardium: There is no evidence of  pericardial effusion. Mitral Valve: The mitral valve is normal in structure. Trivial mitral valve regurgitation. No evidence of mitral valve stenosis. Tricuspid Valve: The tricuspid valve is normal in structure. Tricuspid valve regurgitation is mild to moderate. Aortic Valve:  Signed   By: Lupita Raider M.D.   On: 02/04/2023 12:55   ECHOCARDIOGRAM COMPLETE  Result Date: 02/04/2023    ECHOCARDIOGRAM REPORT   Patient Name:   Rachael Austin [facility] Date of Exam: 02/03/2023 Medical Rec #:  [id number]          Height:       64.0 in Accession #:    [id number]         Weight:       142.0 lb Date of Birth:  June 04, 1941          BSA:          1.691 m Patient Age:    82 years           BP:           159/63 mmHg Patient Gender: F                  HR:           115 bpm. Exam Location:  [facility] Procedure: 2D Echo, Cardiac Doppler and Color Doppler Indications:     R07.9 Chest pain  History:         Patient has prior history of Echocardiogram examinations, most                  recent 11/21/2021.  Sonographer:     Daphine Deutscher RDCS Referring Phys:  [id number] Rachael Austin Diagnosing Phys: Rachael Kendall MD IMPRESSIONS  1. Left ventricular ejection fraction, by estimation, is 65 to 70%. The left ventricle has normal  function. The left ventricle has no regional wall motion abnormalities. There is mild left ventricular hypertrophy. Left ventricular diastolic parameters are consistent with Grade I diastolic dysfunction (impaired relaxation). Elevated left atrial pressure.  2. Right ventricular systolic function is mildly reduced. The right ventricular size is normal. There is normal pulmonary artery systolic pressure.  3. The mitral valve is normal in structure. Trivial mitral valve regurgitation. No evidence of mitral stenosis.  4. Tricuspid valve regurgitation is mild to moderate.  5. The aortic valve has an indeterminant number of cusps. There is mild calcification of the aortic valve. There is mild thickening of the aortic valve. Aortic valve regurgitation is not visualized. Aortic valve sclerosis/calcification is present, without any evidence of aortic stenosis.  6. The inferior vena cava is normal in size with <50% respiratory variability, suggesting right atrial pressure of 8 mmHg. FINDINGS  Left Ventricle: Left ventricular ejection fraction, by estimation, is 65 to 70%. The left ventricle has normal function. The left ventricle has no regional wall motion abnormalities. The left ventricular internal cavity size was normal in size. There is  mild left ventricular hypertrophy. Left ventricular diastolic parameters are consistent with Grade I diastolic dysfunction (impaired relaxation). Elevated left atrial pressure. Right Ventricle: The right ventricular size is normal. No increase in right ventricular wall thickness. Right ventricular systolic function is mildly reduced. There is normal pulmonary artery systolic pressure. The tricuspid regurgitant velocity is 2.50 m/s, and with an assumed right atrial pressure of 8 mmHg, the estimated right ventricular systolic pressure is 33.0 mmHg. Left Atrium: Left atrial size was normal in size. Right Atrium: Right atrial size was normal in size. Pericardium: There is no evidence of  pericardial effusion. Mitral Valve: The mitral valve is normal in structure. Trivial mitral valve regurgitation. No evidence of mitral valve stenosis. Tricuspid Valve: The tricuspid valve is normal in structure. Tricuspid valve regurgitation is mild to moderate. Aortic Valve:  Subjective:    [name], female    DOB: 11-Jul-1941, 82 y.o.   MRN: [id number]  Patient here for  Chief Complaint  Patient presents with   Medical Management of Chronic Issues    HPI Here for a scheduled follow up. Here to follow up regarding hypercholesterolemia, hypertension and afib. Recently admitted 02/01/23 - with left femur fracture.  Is S/p percutaneous fixation of left femoral neck hip fracture on 02/02/2023.  Continues f/u with ortho. Saw ortho 7//24 - left distal radius fracture and nondisplaced fracture of the middle phalanx. Overall doing better.  Evaluated by urology 07/15/23 - continue trospium for OAB.  Placed on ceftin for possible UTI. Recommended trial of vaginal estrogen. Had f/u with cardiology 06/23/23 - recommended continuing eliquis and coreg. Overall doing better.  Walking. Persistent right leg lesion. Request referral to dermatology. Discussed needs f/u cxr - f/u for clearance.    Past Medical History:  Diagnosis Date   AK (actinic keratosis) 11/12/2022   left upper arm, tx'd with EDC   AK (actinic keratosis) 11/12/2022   right medial pretibia, LN2 11/25/22   Basal cell carcinoma 06/21/2020   left post shoulder sup, left mid chest, left lat thigh   Basal cell carcinoma 11/07/2021   L upper back, EDC   Basal cell carcinoma 11/07/2021   R upper back, EDC   Basal cell carcinoma 11/27/2021   right upper arm, EDC   Basal cell carcinoma 11/27/2021   right shoulder posterior, EDC   Basal cell carcinoma 11/27/2021   right anterior shoulder, EDC   Basal cell carcinoma (BCC) 06/13/2021   left dorsal foot, EDC 10/02/2021   Basal cell carcinoma (BCC) 06/13/2021   right postauricular tx'd w/ EDC   BCC (basal cell carcinoma of skin) 03/30/2007   R inf med pretibial - BCC   BCC (basal cell carcinoma of skin) 10/12/2013   L nasal ala - BCC   BCC (basal cell carcinoma of skin) 03/10/2018   L mid dorsum med forearm - superficial BCC    BCC (basal cell  carcinoma) 10/02/2021   left dorsal foot proximal, EDC 10/02/2021   BCC (basal cell carcinoma) 10/02/2021   left mid back, superficial EDC 11/07/21   BCC (basal cell carcinoma) 10/02/2021   right pretibia   BCC (basal cell carcinoma) 10/02/2021   right mid back, superficial EDC 11/07/21   BCC (basal cell carcinoma) 08/14/2022   left distal calf, tx'd with EDC   BCC (basal cell carcinoma) 11/12/2022   superficial at left lateral pretibial,l tx'd with EDC   BCC (basal cell carcinoma) 11/12/2022   superficial at mid back right of midline, scheduled for EDC   BCC (basal cell carcinoma) 11/12/2022   mid back right of midline, [facility] 11/25/22   Breast screening, unspecified 2013   Cancer (HCC) 1992   skin   Coronary artery calcification seen on CT scan    Degenerative disk disease    Diastolic dysfunction    a. 10/2021 Echo: EF 60-65%, no rwma, GrI DD, mild MR; b. 01/2023 Echo: EF 65-70%, no rwma, mild LVH, GrI DD Nl RV fxn. Triv MR. Mild-mod TR. AoV sclerosis.   GERD (gastroesophageal reflux disease)    History of basal cell carcinoma (BCC) 08/29/2020    left inferior knee lateral, , left inferior knee medial, right pretibia   History of SCC (squamous cell carcinoma) of skin 08/29/2020   right posterior calf, left lateral calf, and    Hypercholesterolemia 2008  Signed   By: Lupita Raider M.D.   On: 02/04/2023 12:55   ECHOCARDIOGRAM COMPLETE  Result Date: 02/04/2023    ECHOCARDIOGRAM REPORT   Patient Name:   Rachael Austin [facility] Date of Exam: 02/03/2023 Medical Rec #:  [id number]          Height:       64.0 in Accession #:    [id number]         Weight:       142.0 lb Date of Birth:  June 04, 1941          BSA:          1.691 m Patient Age:    82 years           BP:           159/63 mmHg Patient Gender: F                  HR:           115 bpm. Exam Location:  [facility] Procedure: 2D Echo, Cardiac Doppler and Color Doppler Indications:     R07.9 Chest pain  History:         Patient has prior history of Echocardiogram examinations, most                  recent 11/21/2021.  Sonographer:     Daphine Deutscher RDCS Referring Phys:  [id number] Rachael Austin Diagnosing Phys: Rachael Kendall MD IMPRESSIONS  1. Left ventricular ejection fraction, by estimation, is 65 to 70%. The left ventricle has normal  function. The left ventricle has no regional wall motion abnormalities. There is mild left ventricular hypertrophy. Left ventricular diastolic parameters are consistent with Grade I diastolic dysfunction (impaired relaxation). Elevated left atrial pressure.  2. Right ventricular systolic function is mildly reduced. The right ventricular size is normal. There is normal pulmonary artery systolic pressure.  3. The mitral valve is normal in structure. Trivial mitral valve regurgitation. No evidence of mitral stenosis.  4. Tricuspid valve regurgitation is mild to moderate.  5. The aortic valve has an indeterminant number of cusps. There is mild calcification of the aortic valve. There is mild thickening of the aortic valve. Aortic valve regurgitation is not visualized. Aortic valve sclerosis/calcification is present, without any evidence of aortic stenosis.  6. The inferior vena cava is normal in size with <50% respiratory variability, suggesting right atrial pressure of 8 mmHg. FINDINGS  Left Ventricle: Left ventricular ejection fraction, by estimation, is 65 to 70%. The left ventricle has normal function. The left ventricle has no regional wall motion abnormalities. The left ventricular internal cavity size was normal in size. There is  mild left ventricular hypertrophy. Left ventricular diastolic parameters are consistent with Grade I diastolic dysfunction (impaired relaxation). Elevated left atrial pressure. Right Ventricle: The right ventricular size is normal. No increase in right ventricular wall thickness. Right ventricular systolic function is mildly reduced. There is normal pulmonary artery systolic pressure. The tricuspid regurgitant velocity is 2.50 m/s, and with an assumed right atrial pressure of 8 mmHg, the estimated right ventricular systolic pressure is 33.0 mmHg. Left Atrium: Left atrial size was normal in size. Right Atrium: Right atrial size was normal in size. Pericardium: There is no evidence of  pericardial effusion. Mitral Valve: The mitral valve is normal in structure. Trivial mitral valve regurgitation. No evidence of mitral valve stenosis. Tricuspid Valve: The tricuspid valve is normal in structure. Tricuspid valve regurgitation is mild to moderate. Aortic Valve:  Signed   By: Lupita Raider M.D.   On: 02/04/2023 12:55   ECHOCARDIOGRAM COMPLETE  Result Date: 02/04/2023    ECHOCARDIOGRAM REPORT   Patient Name:   Rachael Austin [facility] Date of Exam: 02/03/2023 Medical Rec #:  [id number]          Height:       64.0 in Accession #:    [id number]         Weight:       142.0 lb Date of Birth:  June 04, 1941          BSA:          1.691 m Patient Age:    82 years           BP:           159/63 mmHg Patient Gender: F                  HR:           115 bpm. Exam Location:  [facility] Procedure: 2D Echo, Cardiac Doppler and Color Doppler Indications:     R07.9 Chest pain  History:         Patient has prior history of Echocardiogram examinations, most                  recent 11/21/2021.  Sonographer:     Daphine Deutscher RDCS Referring Phys:  [id number] Rachael Austin Diagnosing Phys: Rachael Kendall MD IMPRESSIONS  1. Left ventricular ejection fraction, by estimation, is 65 to 70%. The left ventricle has normal  function. The left ventricle has no regional wall motion abnormalities. There is mild left ventricular hypertrophy. Left ventricular diastolic parameters are consistent with Grade I diastolic dysfunction (impaired relaxation). Elevated left atrial pressure.  2. Right ventricular systolic function is mildly reduced. The right ventricular size is normal. There is normal pulmonary artery systolic pressure.  3. The mitral valve is normal in structure. Trivial mitral valve regurgitation. No evidence of mitral stenosis.  4. Tricuspid valve regurgitation is mild to moderate.  5. The aortic valve has an indeterminant number of cusps. There is mild calcification of the aortic valve. There is mild thickening of the aortic valve. Aortic valve regurgitation is not visualized. Aortic valve sclerosis/calcification is present, without any evidence of aortic stenosis.  6. The inferior vena cava is normal in size with <50% respiratory variability, suggesting right atrial pressure of 8 mmHg. FINDINGS  Left Ventricle: Left ventricular ejection fraction, by estimation, is 65 to 70%. The left ventricle has normal function. The left ventricle has no regional wall motion abnormalities. The left ventricular internal cavity size was normal in size. There is  mild left ventricular hypertrophy. Left ventricular diastolic parameters are consistent with Grade I diastolic dysfunction (impaired relaxation). Elevated left atrial pressure. Right Ventricle: The right ventricular size is normal. No increase in right ventricular wall thickness. Right ventricular systolic function is mildly reduced. There is normal pulmonary artery systolic pressure. The tricuspid regurgitant velocity is 2.50 m/s, and with an assumed right atrial pressure of 8 mmHg, the estimated right ventricular systolic pressure is 33.0 mmHg. Left Atrium: Left atrial size was normal in size. Right Atrium: Right atrial size was normal in size. Pericardium: There is no evidence of  pericardial effusion. Mitral Valve: The mitral valve is normal in structure. Trivial mitral valve regurgitation. No evidence of mitral valve stenosis. Tricuspid Valve: The tricuspid valve is normal in structure. Tricuspid valve regurgitation is mild to moderate. Aortic Valve:

## 2023-07-26 ENCOUNTER — Encounter: Payer: Self-pay | Admitting: Internal Medicine

## 2023-07-26 DIAGNOSIS — L659 Nonscarring hair loss, unspecified: Secondary | ICD-10-CM | POA: Insufficient documentation

## 2023-07-26 NOTE — Assessment & Plan Note (Signed)
Also found to have multifocal pneumonia.  Treated with abx. Needs f/u cxr to confirm clearance.  Cxr today.

## 2023-07-26 NOTE — Assessment & Plan Note (Signed)
Continue zoloft.  Follow.   

## 2023-07-26 NOTE — Assessment & Plan Note (Signed)
Intolerant to amlodipine.  On hydralazine.  Also on coreg. Pressure as outlined. No changes in medication. Follow metabolic panel.

## 2023-07-26 NOTE — Assessment & Plan Note (Signed)
Noted during recent hospitalization.  Appears to be in SR today.  On eliquis and coreg.  Continue current medication regimen.

## 2023-07-26 NOTE — Assessment & Plan Note (Signed)
Low carb diet and exercise.  Follow met b and a1c.   

## 2023-07-26 NOTE — Assessment & Plan Note (Signed)
S/p EGD - gastritis.  On nexium now.  No problems reported today. States doing ok on this regimen.  Follow.

## 2023-07-26 NOTE — Assessment & Plan Note (Signed)
Persistent right leg lesion.  Refer to dermatology.

## 2023-07-26 NOTE — Assessment & Plan Note (Signed)
Continue mevacor °

## 2023-07-26 NOTE — Assessment & Plan Note (Signed)
Reported concern regarding hair loss.  Check tsh and routine labs.

## 2023-07-26 NOTE — Assessment & Plan Note (Signed)
Request referral to dermatology - right leg lesion.

## 2023-07-26 NOTE — Assessment & Plan Note (Signed)
Continue mevacor.  Low cholesterol diet and exercise.  Follow lipid panel and liver function tests.  

## 2023-08-19 ENCOUNTER — Ambulatory Visit: Payer: Medicare PPO | Admitting: Family Medicine

## 2023-08-19 ENCOUNTER — Encounter: Payer: Self-pay | Admitting: Family Medicine

## 2023-08-19 VITALS — BP 128/62 | HR 64 | Temp 98.2°F | Resp 16 | Ht 64.0 in | Wt 143.1 lb

## 2023-08-19 DIAGNOSIS — R051 Acute cough: Secondary | ICD-10-CM

## 2023-08-19 DIAGNOSIS — R0982 Postnasal drip: Secondary | ICD-10-CM | POA: Diagnosis not present

## 2023-08-19 DIAGNOSIS — E538 Deficiency of other specified B group vitamins: Secondary | ICD-10-CM | POA: Diagnosis not present

## 2023-08-19 LAB — POC COVID19 BINAXNOW: SARS Coronavirus 2 Ag: NEGATIVE

## 2023-08-19 MED ORDER — CYANOCOBALAMIN 1000 MCG/ML IJ SOLN
1000.0000 ug | Freq: Once | INTRAMUSCULAR | Status: AC
  Administered 2023-08-19: 1000 ug via INTRAMUSCULAR

## 2023-08-19 MED ORDER — IPRATROPIUM BROMIDE 0.03 % NA SOLN: 2.0000 | Freq: Two times a day (BID) | NASAL | 12 refills | Status: AC

## 2023-08-19 NOTE — Progress Notes (Signed)
SUBJECTIVE:   Chief Complaint  Patient presents with   Cough    Since last Friday   HPI Presents for acute visit  Discussed the use of AI scribe software for clinical note transcription with the patient, who gave verbal consent to proceed.  History of Present Illness The patient, with a history of pneumonia and recent hospitalization, presents with a week-long history of cough that began with a runny nose and sneezing. The cough is described as severe, particularly at night, causing sleep disturbances. The patient denies any fever or wheezing but reports a sensation of chest tightness, which they describe as 'different.' They deny any shortness of breath. The patient also mentions recent contact with a grandchild who had a cold.  The patient's medical regimen has not changed since their last hospitalization. They report no changes in their medications, which include Nexium for reflux. They have a history of smoking but quit in 1972. The patient denies any recent changes in their living environment and reports no pets at home.  The patient also mentions a recent visit to an ear, nose, and throat doctor and an endoscopy performed two weeks ago. The patient also mentions a history of receiving vitamin B12  injection, but they did not receive their usual dose this month.  The patient's primary concern is to rule out COVID-19 or any other contagious disease due to their recent symptoms and history of hospitalization. They also express concern about the possibility of another pneumonia episode.    PERTINENT PMH / PSH: As above  OBJECTIVE:  BP 128/62   Pulse 64   Temp 98.2 F (36.8 C)   Resp 16   Ht 5\' 4"  (1.626 m)   Wt 143 lb 2 oz (64.9 kg)   LMP 11/20/1976   SpO2 97%   BMI 24.57 kg/m    Physical Exam Vitals reviewed.  Constitutional:      General: She is not in acute distress.    Appearance: She is not ill-appearing.  HENT:     Head: Normocephalic.     Right Ear: Tympanic  membrane, ear canal and external ear normal.     Left Ear: Tympanic membrane, ear canal and external ear normal.     Nose: Nose normal.     Mouth/Throat:     Mouth: Mucous membranes are moist.  Eyes:     Extraocular Movements: Extraocular movements intact.     Conjunctiva/sclera: Conjunctivae normal.     Pupils: Pupils are equal, round, and reactive to light.  Neck:     Vascular: No carotid bruit.  Cardiovascular:     Rate and Rhythm: Normal rate and regular rhythm.     Pulses: Normal pulses.     Heart sounds: Normal heart sounds.  Pulmonary:     Effort: Pulmonary effort is normal.     Breath sounds: Normal breath sounds. No wheezing or rhonchi.  Abdominal:     General: There is no distension.     Tenderness: There is no abdominal tenderness. There is no right CVA tenderness, left CVA tenderness, guarding or rebound.  Musculoskeletal:     Cervical back: Normal range of motion.     Right lower leg: No edema.     Left lower leg: No edema.  Lymphadenopathy:     Cervical: No cervical adenopathy.  Skin:    Capillary Refill: Capillary refill takes less than 2 seconds.  Neurological:     General: No focal deficit present.     Mental Status:  She is alert and oriented to person, place, and time. Mental status is at baseline.     Motor: No weakness.  Psychiatric:        Mood and Affect: Mood normal.        Behavior: Behavior normal.        Thought Content: Thought content normal.        Judgment: Judgment normal.        08/19/2023   11:24 AM 07/21/2023   10:25 AM 06/26/2022    2:52 PM 06/19/2022   10:16 AM 05/01/2022   10:24 AM  Depression screen PHQ 2/9  Decreased Interest 1 1 3  0 0  Down, Depressed, Hopeless 1 1 0 0 0  PHQ - 2 Score 2 2 3  0 0  Altered sleeping 2 1 0    Tired, decreased energy 2 1 0    Change in appetite 1 2 0    Feeling bad or failure about yourself  1 1 0    Trouble concentrating 1 1 0    Moving slowly or fidgety/restless 0 0 0    Suicidal thoughts 0 0  0    PHQ-9 Score 9 8 3     Difficult doing work/chores Somewhat difficult Somewhat difficult Not difficult at all        08/19/2023   11:25 AM  GAD 7 : Generalized Anxiety Score  Nervous, Anxious, on Edge 1  Control/stop worrying 1  Trouble relaxing 1  Restless 0  Easily annoyed or irritable 0  Afraid - awful might happen 0  Anxiety Difficulty Somewhat difficult    ASSESSMENT/PLAN:  Post-nasal drainage Assessment & Plan: Cough for one week, worse at night, with prior rhinorrhea and sneezing. No fever, wheezing, or significant shortness of breath. Possible postnasal drip contributing to cough. No signs of pneumonia on recent chest x-ray. -Trial of Flonase nasal spray to address possible postnasal drip. -If symptoms persist, consider referral to Ear, Nose, and Throat specialist.  Orders: -     Ipratropium Bromide; Place 2 sprays into both nostrils every 12 (twelve) hours.  Dispense: 30 mL; Refill: 12  Acute cough Assessment & Plan: Cough for one week, worse at night, with prior rhinorrhea and sneezing. No fever, wheezing, or significant shortness of breath. Possible postnasal drip contributing to cough. No signs of pneumonia on recent chest x-ray.  -Trial of Flonase nasal spray to address possible postnasal drip. -COVID negative  Orders: -     POC COVID-19 BinaxNow  B12 deficiency Assessment & Plan: Patient inquires about Vitamin B12 injection. -Vitamin B12  injection administered today by CMA  Orders: -     Cyanocobalamin    PDMP reviewed  Return if symptoms worsen or fail to improve, for PCP.  Dana Allan, MD

## 2023-08-19 NOTE — Patient Instructions (Addendum)
It was a pleasure meeting you today. Thank you for allowing me to take part in your health care.  Our goals for today as we discussed include:  COVID negative  Vitamin B injection was given Oct 22 during clinic visit.  Due Nov 22  Start Atrovent nasal spray 2 sprays two times a day If no improvement follow up with PCP  This is a list of the screening recommended for you and due dates:  Health Maintenance  Topic Date Due   Medicare Annual Wellness Visit  09/20/2015   COVID-19 Vaccine (3 - Pfizer risk series) 12/21/2019   Zoster (Shingles) Vaccine (1 of 2) 10/21/2023*   Pneumonia Vaccine (1 of 1 - PCV) 11/05/2023*   Mammogram  05/11/2024*   Flu Shot  Completed   DEXA scan (bone density measurement)  Completed   HPV Vaccine  Aged Out   DTaP/Tdap/Td vaccine  Discontinued  *Topic was postponed. The date shown is not the original due date.      If you have any questions or concerns, please do not hesitate to call the office at [phone].  I look forward to our next visit and until then take care and stay safe.  Regards,   Dana Allan, MD   [facility]

## 2023-08-20 DIAGNOSIS — S72002D Fracture of unspecified part of neck of left femur, subsequent encounter for closed fracture with routine healing: Secondary | ICD-10-CM | POA: Diagnosis not present

## 2023-08-24 ENCOUNTER — Telehealth: Payer: Self-pay | Admitting: Internal Medicine

## 2023-08-24 NOTE — Telephone Encounter (Signed)
Prescription Request  08/24/2023  LOV: 07/21/2023  What is the name of the medication or equipment? ELIQUIS   Have you contacted your pharmacy to request a refill? No   Which pharmacy would you like this sent to?  [facility] [address] [place] - [address] [place] [address] Phone: [phone] Fax: [phone]    Patient notified that their request is being sent to the clinical staff for review and that they should receive a response within 2 business days.   Please advise at Mobile [phone] (mobile)

## 2023-08-24 NOTE — Telephone Encounter (Signed)
Prescribed by cardiology. Gave pt number to contact.

## 2023-08-25 ENCOUNTER — Telehealth: Payer: Self-pay | Admitting: Internal Medicine

## 2023-08-25 ENCOUNTER — Other Ambulatory Visit: Payer: Self-pay

## 2023-08-25 MED ORDER — APIXABAN 5 MG PO TABS: 5.0000 mg | ORAL_TABLET | Freq: Two times a day (BID) | ORAL | 3 refills | Status: DC

## 2023-08-25 NOTE — Telephone Encounter (Signed)
Eliquis refilled under Nicolasa Ducking, NP per last phone note giving the ok.

## 2023-08-25 NOTE — Telephone Encounter (Signed)
Prescription Request  08/25/2023  LOV: 07/21/2023  What is the name of the medication or equipment? apixaban (ELIQUIS) 5 MG TABS tablet   Have you contacted your pharmacy to request a refill? No   Which pharmacy would you like this sent to?  [facility] [address], [place] - [address] [place] [address] Phone: [phone] Fax: [phone]    Patient notified that their request is being sent to the clinical staff for review and that they should receive a response within 2 business days.   Please advise at Mobile [phone] (mobile)

## 2023-08-28 ENCOUNTER — Encounter: Payer: Self-pay | Admitting: Family Medicine

## 2023-08-28 DIAGNOSIS — E538 Deficiency of other specified B group vitamins: Secondary | ICD-10-CM | POA: Insufficient documentation

## 2023-08-28 DIAGNOSIS — R051 Acute cough: Secondary | ICD-10-CM | POA: Insufficient documentation

## 2023-08-28 DIAGNOSIS — R059 Cough, unspecified: Secondary | ICD-10-CM | POA: Insufficient documentation

## 2023-08-28 NOTE — Assessment & Plan Note (Signed)
Patient inquires about Vitamin B12 injection. -Vitamin B12  injection administered today by CMA

## 2023-08-28 NOTE — Assessment & Plan Note (Signed)
Cough for one week, worse at night, with prior rhinorrhea and sneezing. No fever, wheezing, or significant shortness of breath. Possible postnasal drip contributing to cough. No signs of pneumonia on recent chest x-ray.  -Trial of Flonase nasal spray to address possible postnasal drip. -COVID negative

## 2023-08-28 NOTE — Assessment & Plan Note (Addendum)
Cough for one week, worse at night, with prior rhinorrhea and sneezing. No fever, wheezing, or significant shortness of breath. Possible postnasal drip contributing to cough. No signs of pneumonia on recent chest x-ray. -Trial of Flonase nasal spray to address possible postnasal drip. -If symptoms persist, consider referral to Ear, Nose, and Throat specialist.

## 2023-09-07 ENCOUNTER — Other Ambulatory Visit: Payer: Self-pay | Admitting: Urology

## 2023-09-10 ENCOUNTER — Other Ambulatory Visit: Payer: Self-pay | Admitting: Urology

## 2023-09-11 ENCOUNTER — Other Ambulatory Visit: Payer: Self-pay | Admitting: Urology

## 2023-09-11 NOTE — Telephone Encounter (Signed)
Patient called and left message stating that she needs her medication refilled. This was denied originally because per last note with Carollee Herter patient is not to be on this medication due to QT prolonged waves. I left a message for the patient to let us know if she has been taking this medication and never stopped?

## 2023-09-11 NOTE — Telephone Encounter (Signed)
Pt aware she is to not be taking the imipramine.   Pt voiced understanding.

## 2023-09-18 ENCOUNTER — Ambulatory Visit: Payer: Medicare PPO | Admitting: Nurse Practitioner

## 2023-09-18 ENCOUNTER — Encounter: Payer: Self-pay | Admitting: Nurse Practitioner

## 2023-09-18 ENCOUNTER — Telehealth: Payer: Self-pay

## 2023-09-18 VITALS — BP 120/60 | HR 63 | Temp 97.6°F | Ht 64.0 in | Wt 134.4 lb

## 2023-09-18 DIAGNOSIS — R319 Hematuria, unspecified: Secondary | ICD-10-CM

## 2023-09-18 DIAGNOSIS — E538 Deficiency of other specified B group vitamins: Secondary | ICD-10-CM

## 2023-09-18 DIAGNOSIS — R399 Unspecified symptoms and signs involving the genitourinary system: Secondary | ICD-10-CM

## 2023-09-18 LAB — URINALYSIS, ROUTINE W REFLEX MICROSCOPIC
Bilirubin Urine: NEGATIVE
Ketones, ur: NEGATIVE
Nitrite: NEGATIVE
Specific Gravity, Urine: <=1.005 — AB (ref 1.000–1.030)
Total Protein, Urine: NEGATIVE
Urine Glucose: NEGATIVE
Urobilinogen, UA: 0.2 (ref 0.0–1.0)
pH: 6.5 (ref 5.0–8.0)

## 2023-09-18 LAB — POCT URINALYSIS DIPSTICK
Bilirubin, UA: NEGATIVE
Glucose, UA: NEGATIVE
Ketones, UA: NEGATIVE
Nitrite, UA: NEGATIVE
Protein, UA: NEGATIVE
Spec Grav, UA: <=1.005 — AB (ref 1.010–1.025)
Urobilinogen, UA: 0.2 U/dL
pH, UA: 5.5 (ref 5.0–8.0)

## 2023-09-18 MED ORDER — CYANOCOBALAMIN 1000 MCG/ML IJ SOLN
1000.0000 ug | Freq: Once | INTRAMUSCULAR | Status: AC
  Administered 2023-09-18: 1000 ug via INTRAMUSCULAR

## 2023-09-18 MED ORDER — FOSFOMYCIN TROMETHAMINE 3 G PO PACK: 3.0000 g | PACK | Freq: Once | ORAL | 0 refills | Status: DC

## 2023-09-18 NOTE — Progress Notes (Signed)
Please inform pt I have sent fosfomycin to the pharmacy. It is a one time dose.  We will call with the culture result. If her symptoms are not improving she need to be reevaluated.

## 2023-09-18 NOTE — Telephone Encounter (Signed)
Copied from CRM [id number]. Topic: General - Other >> Sep 18, 2023  8:10 AM Donita Brooks wrote: Reason for CRM: pt would like for Dr. Lorin Picket nurse Rosann Auerbach to give her a call at [phone]

## 2023-09-18 NOTE — Telephone Encounter (Signed)
Scheduled for appt.

## 2023-09-18 NOTE — Progress Notes (Unsigned)
Established Patient Office Visit  Subjective:  [name], female    DOB: 1941-08-28  Age: 82 y.o. MRN: [id number]  CC:  Chief Complaint  Patient presents with   Bleeding/Bruising    PT stated that she noticed there was a pink/red when she wiped after using the a bathroom    HPI  Rachael Austin presents for dysuria, bleeding that started yesterday. Pt states that she saw blood on toilet paper yesterday when she wiped after urination and pink color in the commode.   Denise abdominal pain, frequency,  She is on eliquis 5 mg daily.   She would also want her B12 injection today.  HPI   Past Medical History:  Diagnosis Date   AK (actinic keratosis) 11/12/2022   left upper arm, tx'd with EDC   AK (actinic keratosis) 11/12/2022   right medial pretibia, LN2 11/25/22   Basal cell carcinoma 06/21/2020   left post shoulder sup, left mid chest, left lat thigh   Basal cell carcinoma 11/07/2021   L upper back, EDC   Basal cell carcinoma 11/07/2021   R upper back, EDC   Basal cell carcinoma 11/27/2021   right upper arm, EDC   Basal cell carcinoma 11/27/2021   right shoulder posterior, EDC   Basal cell carcinoma 11/27/2021   right anterior shoulder, EDC   Basal cell carcinoma (BCC) 06/13/2021   left dorsal foot, EDC 10/02/2021   Basal cell carcinoma (BCC) 06/13/2021   right postauricular tx'd w/ EDC   BCC (basal cell carcinoma of skin) 03/30/2007   R inf med pretibial - BCC   BCC (basal cell carcinoma of skin) 10/12/2013   L nasal ala - BCC   BCC (basal cell carcinoma of skin) 03/10/2018   L mid dorsum med forearm - superficial BCC    BCC (basal cell carcinoma) 10/02/2021   left dorsal foot proximal, EDC 10/02/2021   BCC (basal cell carcinoma) 10/02/2021   left mid back, superficial EDC 11/07/21   BCC (basal cell carcinoma) 10/02/2021   right pretibia   BCC (basal cell carcinoma) 10/02/2021   right mid back, superficial EDC 11/07/21   BCC (basal cell  carcinoma) 08/14/2022   left distal calf, tx'd with EDC   BCC (basal cell carcinoma) 11/12/2022   superficial at left lateral pretibial,l tx'd with EDC   BCC (basal cell carcinoma) 11/12/2022   superficial at mid back right of midline, scheduled for EDC   BCC (basal cell carcinoma) 11/12/2022   mid back right of midline, [facility] 11/25/22   Breast screening, unspecified 2013   Cancer (HCC) 1992   skin   Coronary artery calcification seen on CT scan    Degenerative disk disease    Diastolic dysfunction    a. 10/2021 Echo: EF 60-65%, no rwma, GrI DD, mild MR; b. 01/2023 Echo: EF 65-70%, no rwma, mild LVH, GrI DD Nl RV fxn. Triv MR. Mild-mod TR. AoV sclerosis.   GERD (gastroesophageal reflux disease)    History of basal cell carcinoma (BCC) 08/29/2020    left inferior knee lateral, , left inferior knee medial, right pretibia   History of SCC (squamous cell carcinoma) of skin 08/29/2020   right posterior calf, left lateral calf, and    Hypercholesterolemia 2008   Interstitial cystitis    followed by Dr Achilles Dunk   Near syncope    Other sign and symptom in breast 2013   Left upper outer quadrant breast "soreness" Ultrasound exam of right breast in the  2 o'clock position with the breast distracted medially showed a 0.3-0.4 with 0.5 cm simple cyst. In the 1 o'clock position where pt reported tenderness US exam was negatiive.  Because of her history of intermittent nipple drainage, ultrasound was completed of the retroareolar area.   PAF (paroxysmal atrial fibrillation) (HCC)    a. dx 01/2023-->Eliquis 5 BID (CHA2DS2VASc = 5).   Personal history of tobacco use, presenting hazards to health    PONV (postoperative nausea and vomiting)    Primary hypertension    SCC (squamous cell carcinoma) 03/28/2008   R dorsum hand - SCC   SCC (squamous cell carcinoma) 05/09/2009   R lat lower leg - SCCIS   SCC (squamous cell carcinoma) 05/09/2009   L lat lower leg - SCCIS   SCC (squamous cell carcinoma) 04/07/2013    L lower leg - SCCIS   SCC (squamous cell carcinoma) 04/27/2013   R forearm - SCC   SCC (squamous cell carcinoma) 04/28/2017   L lat knee - SCCIS   SCC (squamous cell carcinoma) 05/12/2018   L prox dorsum forearm - SCC   SCC (squamous cell carcinoma) 06/13/2021   left forearm tx'd w/ EDC   SCC (squamous cell carcinoma), leg, right 03/30/2007   R sup pretibial - SCCIS   SCC (squamous cell carcinoma);BCC 10/12/2013   Mid back - superficial BCC with SCCIS    Special screening for malignant neoplasms, colon    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   left dorsal forearm, left lat calf   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   left lower leg superior, EDC at follow up   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   chest right of midline, tx'd with [facility]    Past Surgical History:  Procedure Laterality Date   ABDOMINAL HYSTERECTOMY  1992   APPENDECTOMY     CERVICAL DISCECTOMY     S/P C7-T1 discectomy with fusion   CHOLECYSTECTOMY  1990   COLONOSCOPY WITH PROPOFOL N/A 02/14/2016   Procedure: COLONOSCOPY WITH PROPOFOL;  Surgeon: Midge Minium, MD;  Location: [facility];  Service: Endoscopy;  Laterality: N/A;  PT WOULD LIKE 10 ARRIVAL TIME OR LATER   DILATION AND CURETTAGE OF UTERUS     ESOPHAGOGASTRODUODENOSCOPY (EGD) WITH PROPOFOL N/A 02/14/2016   Procedure: ESOPHAGOGASTRODUODENOSCOPY (EGD) WITH PROPOFOL with dialtion;  Surgeon: Midge Minium, MD;  Location: [facility];  Service: Endoscopy;  Laterality: N/A;   ESOPHAGOGASTRODUODENOSCOPY (EGD) WITH PROPOFOL N/A 04/13/2019   Procedure: ESOPHAGOGASTRODUODENOSCOPY (EGD) WITH PROPOFOL;  Surgeon: Toledo, Boykin Nearing, MD;  Location: [facility];  Service: Gastroenterology;  Laterality: N/A;   HIP PINNING,CANNULATED Left 02/02/2023   Procedure: PERCUTANEOUS FIXATION OF FEMORAL NECK;  Surgeon: Juanell Fairly, MD;  Location: [facility];  Service: Orthopedics;  Laterality: Left;   MELANOMA EXCISION     removed from  Left calf 1994    Family History  Problem Relation Age of Onset   Hodgkin's lymphoma Mother    Heart failure Father    Heart attack Father    Arthritis Sister        Three sisters w/ degeneratve disk disease   Headache Sister        Two sisters hx of headache   Breast cancer Neg Hx    Colon cancer Neg Hx    Bladder Cancer Neg Hx    Kidney cancer Neg Hx     Social History   Socioeconomic History   Marital status: Widowed    Spouse name:  Not on file   Number of children: 2   Years of education: Not on file   Highest education level: Not on file  Occupational History   Not on file  Tobacco Use   Smoking status: Former    Current packs/day: 1.00    Average packs/day: 1 pack/day for 15.0 years (15.0 ttl pk-yrs)    Types: Cigarettes   Smokeless tobacco: Never  Vaping Use   Vaping status: Never Used  Substance and Sexual Activity   Alcohol use: No    Alcohol/week: 0.0 standard drinks of alcohol   Drug use: No   Sexual activity: Not on file  Other Topics Concern   Not on file  Social History Narrative   Married and has 2 children, daughters.   Social Drivers of Corporate investment banker Strain: Not on file  Food Insecurity: No Food Insecurity (02/11/2023)   Hunger Vital Sign    Worried About Running Out of Food in the Last Year: Never true    Ran Out of Food in the Last Year: Never true  Transportation Needs: No Transportation Needs (02/11/2023)   PRAPARE - Administrator, Civil Service (Medical): No    Lack of Transportation (Non-Medical): No  Physical Activity: Not on file  Stress: Not on file  Social Connections: Not on file  Intimate Partner Violence: Not At Risk (02/11/2023)   Humiliation, Afraid, Rape, and Kick questionnaire    Fear of Current or Ex-Partner: No    Emotionally Abused: No    Physically Abused: No    Sexually Abused: No     Outpatient Medications Prior to Visit  Medication Sig Dispense Refill   acetaminophen (TYLENOL) 325 MG  tablet Take 650 mg by mouth as needed.     apixaban (ELIQUIS) 5 MG TABS tablet Take 1 tablet (5 mg total) by mouth 2 (two) times daily. 60 tablet 3   conjugated estrogens (PREMARIN) vaginal cream Place 1 Applicatorful vaginally daily. Apply 0.5mg  (pea-sized amount)  just inside the vaginal introitus with a finger-tip on  Monday, Wednesday and Friday nights. 30 g 12   esomeprazole (NEXIUM) 40 MG capsule Take 1 capsule by mouth once daily 90 capsule 3   hydrALAZINE (APRESOLINE) 25 MG tablet Take 1 tablet (25 mg total) by mouth 2 (two) times daily. 180 tablet 3   imipramine (TOFRANIL) 10 MG tablet Take 10 mg by mouth at bedtime.     ipratropium (ATROVENT) 0.03 % nasal spray Place 2 sprays into both nostrils every 12 (twelve) hours. 30 mL 12   lovastatin (MEVACOR) 20 MG tablet Take 1 tablet (20 mg total) by mouth at bedtime. 90 tablet 1   sertraline (ZOLOFT) 50 MG tablet Take 1 tablet (50 mg total) by mouth daily. 90 tablet 1   carvedilol (COREG) 25 MG tablet Take 1 tablet (25 mg total) by mouth 2 (two) times daily with a meal. 180 tablet 1   trospium (SANCTURA) 20 MG tablet Take 1 tablet (20 mg total) by mouth daily. 90 tablet 3   No facility-administered medications prior to visit.    Allergies  Allergen Reactions   Augmentin [Amoxicillin-Pot Clavulanate] Swelling and Rash    Swelling of the lips   Lexapro [Escitalopram Oxalate]    Micardis [Telmisartan] Itching   Myrbetriq [Mirabegron] Itching   Niacin And Related Itching   Codeine Sulfate Other (See Comments)    "Makes her hyper"   Vesicare [Solifenacin Succinate] Nausea And Vomiting and Rash    ROS  Review of Systems Negative unless indicated in HPI.    Objective:    Physical Exam Exam conducted with a chaperone present.  Constitutional:      Appearance: Normal appearance.  Cardiovascular:     Rate and Rhythm: Normal rate and regular rhythm.     Pulses: Normal pulses.     Heart sounds: Normal heart sounds.  Abdominal:      General: Bowel sounds are normal.     Palpations: Abdomen is soft.     Tenderness: There is no abdominal tenderness. There is no right CVA tenderness or left CVA tenderness.  Musculoskeletal:     Cervical back: Normal range of motion.  Neurological:     General: No focal deficit present.     Mental Status: She is alert. Mental status is at baseline.  Psychiatric:        Mood and Affect: Mood normal.        Behavior: Behavior normal.        Thought Content: Thought content normal.        Judgment: Judgment normal.     BP 120/60   Pulse 63   Temp 97.6 F (36.4 C) (Oral)   Ht 5\' 4"  (1.626 m)   Wt 134 lb 6.4 oz (61 kg)   LMP 11/20/1976   SpO2 98%   BMI 23.07 kg/m  Wt Readings from Last 3 Encounters:  09/18/23 134 lb 6.4 oz (61 kg)  08/19/23 143 lb 2 oz (64.9 kg)  07/21/23 141 lb (64 kg)     Health Maintenance  Topic Date Due   Medicare Annual Wellness (AWV)  09/20/2015   COVID-19 Vaccine (3 - Pfizer risk series) 12/21/2019   Zoster Vaccines- Shingrix (1 of 2) 10/21/2023 (Originally 01/11/1960)   Pneumonia Vaccine 14+ Years old (1 of 2 - PCV) 11/05/2023 (Originally 01/10/1947)   MAMMOGRAM  05/11/2024 (Originally 05/01/2023)   INFLUENZA VACCINE  Completed   DEXA SCAN  Completed   HPV VACCINES  Aged Out   DTaP/Tdap/Td  Discontinued    There are no preventive care reminders to display for this patient.  Lab Results  Component Value Date   TSH 3.29 07/21/2023   Lab Results  Component Value Date   WBC 6.6 07/21/2023   HGB 12.0 07/21/2023   HCT 36.5 07/21/2023   MCV 87.4 07/21/2023   PLT 208.0 07/21/2023   Lab Results  Component Value Date   NA 136 07/21/2023   K 4.5 07/21/2023   CO2 27 07/21/2023   GLUCOSE 107 (H) 07/21/2023   BUN 10 07/21/2023   CREATININE 0.96 07/21/2023   BILITOT 0.6 07/21/2023   ALKPHOS 71 07/21/2023   AST 15 07/21/2023   ALT 8 07/21/2023   PROT 6.8 07/21/2023   ALBUMIN 4.3 07/21/2023   CALCIUM 9.2 07/21/2023   ANIONGAP 6 02/12/2023    GFR 54.71 (L) 07/21/2023   Lab Results  Component Value Date   CHOL 166 07/21/2023   Lab Results  Component Value Date   HDL 61.00 07/21/2023   Lab Results  Component Value Date   LDLCALC 91 07/21/2023   Lab Results  Component Value Date   TRIG 72.0 07/21/2023   Lab Results  Component Value Date   CHOLHDL 3 07/21/2023   Lab Results  Component Value Date   HGBA1C 6.0 07/21/2023      Assessment & Plan:  UTI symptoms -     POCT urinalysis dipstick    Follow-up: No follow-ups on file.   Chai Verdejo  Evelene Croon, NP

## 2023-09-18 NOTE — Progress Notes (Unsigned)
Pt presented for their vitamin B12 injection. Pt was identified through two identifiers. Pt tolerated shot well in their left  deltoid.  

## 2023-09-21 ENCOUNTER — Telehealth: Payer: Self-pay | Admitting: Nurse Practitioner

## 2023-09-21 DIAGNOSIS — R399 Unspecified symptoms and signs involving the genitourinary system: Secondary | ICD-10-CM | POA: Insufficient documentation

## 2023-09-21 DIAGNOSIS — R319 Hematuria, unspecified: Secondary | ICD-10-CM | POA: Insufficient documentation

## 2023-09-21 DIAGNOSIS — N3001 Acute cystitis with hematuria: Secondary | ICD-10-CM

## 2023-09-21 MED ORDER — CIPROFLOXACIN HCL 250 MG PO TABS: 250.0000 mg | ORAL_TABLET | Freq: Two times a day (BID) | ORAL | 0 refills | Status: AC

## 2023-09-21 NOTE — Telephone Encounter (Signed)
Urine Cx-preliminary report

## 2023-09-21 NOTE — Assessment & Plan Note (Addendum)
Urinalysis positive for leukocytes, large blood and negative for nitrite.  Patient on Eliquis 5 Mg daily. Will treat with fosfomycin.  Urine culture pending

## 2023-09-21 NOTE — Telephone Encounter (Signed)
Pt.notified

## 2023-09-21 NOTE — Assessment & Plan Note (Signed)
Urinalysis positive for leukocytes, large blood and negative for nitrite. Will treat with fosfomycin.  Patient on Eliquis 5 mg daily. Urine culture pending. She will let us know if symptoms not improving.

## 2023-09-21 NOTE — Telephone Encounter (Signed)
Please cal the pt and let her know that we have send ciprofloxacin to the pharmacy take it twice  a day for 5 days. If the pt has not taken the fosfomycin then do not take it now. Just take cipro.  Keep [place] updated regarding her symptoms.

## 2023-09-23 LAB — URINE CULTURE
MICRO NUMBER:: 15877148
SPECIMEN QUALITY:: ADEQUATE

## 2023-09-24 ENCOUNTER — Other Ambulatory Visit: Payer: Self-pay | Admitting: Internal Medicine

## 2023-09-24 MED ORDER — NITROFURANTOIN MONOHYD MACRO 100 MG PO CAPS: 100.0000 mg | ORAL_CAPSULE | Freq: Two times a day (BID) | ORAL | 0 refills | Status: AC

## 2023-09-24 NOTE — Progress Notes (Signed)
Opened in error

## 2023-09-24 NOTE — Addendum Note (Signed)
Addended by: Rita Ohara D on: 09/24/2023 09:12 AM   Modules accepted: Orders

## 2023-09-24 NOTE — Progress Notes (Signed)
Noted! Thank you

## 2023-09-26 ENCOUNTER — Other Ambulatory Visit: Payer: Self-pay | Admitting: Nurse Practitioner

## 2023-10-01 ENCOUNTER — Other Ambulatory Visit: Payer: Self-pay

## 2023-10-01 ENCOUNTER — Telehealth: Payer: Self-pay

## 2023-10-01 MED ORDER — LOVASTATIN 20 MG PO TABS: 20.0000 mg | ORAL_TABLET | Freq: Every day | ORAL | 1 refills | Status: DC

## 2023-10-01 NOTE — Telephone Encounter (Signed)
 Copied from CRM [id number]. Topic: Clinical - Medication Refill >> Oct 01, 2023  8:49 AM Burnard DEL wrote: Most Recent Primary Care Visit:  Provider: VINCENTE SABER  Department: [facility]  Visit Type: ACUTE  Date: 09/18/2023  Medication:  lovastatin  (MEVACOR ) 20 MG tablet   Has the patient contacted their pharmacy? Yes (Agent: If no, request that the patient contact the pharmacy for the refill. If patient does not wish to contact the pharmacy document the reason why and proceed with request.) (Agent: If yes, when and what did the pharmacy advise?)  Is this the correct pharmacy for this prescription? Yes If no, delete pharmacy and type the correct one.  This is the patient's preferred pharmacy:  [facility] [address] [place] - [address] [place] [address] Phone: [phone] Fax: [phone]   Has the prescription been filled recently? Yes  Is the patient out of the medication? No  Has the patient been seen for an appointment in the last year OR does the patient have an upcoming appointment? Yes  Can we respond through MyChart? Yes  Agent: Please be advised that Rx refills may take up to 3 business days. We ask that you follow-up with your pharmacy.

## 2023-10-01 NOTE — Telephone Encounter (Signed)
 Medication refilled

## 2023-10-06 ENCOUNTER — Encounter: Payer: Self-pay | Admitting: Dermatology

## 2023-10-06 ENCOUNTER — Ambulatory Visit: Payer: Medicare PPO | Admitting: Dermatology

## 2023-10-06 DIAGNOSIS — D492 Neoplasm of unspecified behavior of bone, soft tissue, and skin: Secondary | ICD-10-CM | POA: Diagnosis not present

## 2023-10-06 DIAGNOSIS — L821 Other seborrheic keratosis: Secondary | ICD-10-CM | POA: Diagnosis not present

## 2023-10-06 DIAGNOSIS — D485 Neoplasm of uncertain behavior of skin: Secondary | ICD-10-CM

## 2023-10-06 DIAGNOSIS — W908XXA Exposure to other nonionizing radiation, initial encounter: Secondary | ICD-10-CM | POA: Diagnosis not present

## 2023-10-06 DIAGNOSIS — L578 Other skin changes due to chronic exposure to nonionizing radiation: Secondary | ICD-10-CM

## 2023-10-06 NOTE — Patient Instructions (Signed)

## 2023-10-06 NOTE — Progress Notes (Addendum)
   Follow-Up Visit   Subjective  Rachael Austin is a 83 y.o. female who presents for the following: Irregular skin lesion on the R lower leg which was crusted and painful, but now the crust has fallen off, and it's not as bothersome as it was when she first noticed it. Pt would like her legs checked today.  The patient has spots, moles and lesions to be evaluated, some may be new or changing and the patient may have concern these could be cancer.  The following portions of the chart were reviewed this encounter and updated as appropriate: medications, allergies, medical history  Review of Systems:  No other skin or systemic complaints except as noted in HPI or Assessment and Plan.  Objective  Well appearing patient in no apparent distress; mood and affect are within normal limits.   A focused examination was performed of the following areas: the face and legs   Relevant exam findings are noted in the Assessment and Plan.  Lower legs Pink papules with scar-like appearance on R > L lower leg  Assessment & Plan   ACTINIC DAMAGE - chronic, secondary to cumulative UV radiation exposure/sun exposure over time - diffuse scaly erythematous macules with underlying dyspigmentation - Recommend daily broad spectrum sunscreen SPF 30+ to sun-exposed areas, reapply every 2 hours as needed.  - Recommend staying in the shade or wearing long sleeves, sun glasses (UVA+UVB protection) and wide brim hats (4-inch brim around the entire circumference of the hat). - Call for new or changing lesions.  SEBORRHEIC KERATOSIS - Stuck-on, waxy, tan-brown papules and/or plaques  - Benign-appearing - Discussed benign etiology and prognosis. - Observe - Call for any changes NEOPLASM OF UNCERTAIN BEHAVIOR OF SKIN Lower legs Discussed with patient that basal cell carcinomas rarely spread and are not deadly. Location on lower legs is low risk. If lesions aren't bothersome to patient we don't necessarily  need to biopsy and treat them if they aren't affecting her quality of life. Patient prefers observation today. Will recheck at [facility] appointment with Dr. Jackquline. SEBORRHEIC KERATOSES    Nonhealing wound of the post neck - ulcerated crusted papule 0.7 cm Suspicious for BCC. Would need Mohs. Patient prefers to wait on biopsy If bothersome to patient or changing (growing, hurting, becoming raised, bleeding), patient will return for biopsy, will recheck at [facility] with Dr. Jackquline.   Return for appointment as scheduled for [facility] with Dr. Jackquline.  LILLETTE Rosina Mayans, CMA, am acting as scribe for Boneta Sharps, MD .   Documentation: I have reviewed the above documentation for accuracy and completeness, and I agree with the above.  Boneta Sharps, MD

## 2023-10-12 NOTE — Progress Notes (Signed)
10/15/2023 10:15 PM   Rachael Austin [birth year] [id number]  Referring provider: Dale Mellen, MD [address] [place],  [place] [address]  Urological history: 1. OAB -Contributing factors of age, GSM, hypertension, detrusor dyssynergia, hyperglycemia, COVID, constipation, antihistamines and smoking history -trospium IR 20 mg   2. IC  -contributing factors of age, GSM and constipation -Imipramine 10 mg daily -cannot take due to QT prolongation  Chief Complaint  Patient presents with   Over Active Bladder   HPI: Rachael Austin is a 83 y.o. female who presents today for 54-month follow-up.  Previous records reviewed.   At her visit on 05/13/2023, she is having 1-7 daytime voids with 3 or more episodes of nocturia with a severe urge to urinate.  She is having urge incontinence.  She is leaking 3 more times a day.  She wears 3-4 absorbent pads daily.  She does not limit fluid intake.  She does engage in toilet mapping.  She has been having cloudy urine and dysuria and worsening of her urinary continence since she came out of the hospital from her hip replacement in June.  Patient denies any modifying or aggravating factors.  Patient denies any recent UTI's, gross hematuria, dysuria or suprapubic/flank pain.  Patient denies any fevers, chills, nausea or vomiting.  UA yellow cloudy, specific gravity 1.015, trace heme, pH 6.5, 2+ protein, 3+ leukocytes, greater than 30 WBCs, 3-10 RBCs, mucus threads are present and many bacteria.  Urine culture  MUF.   PVR 109 mL.   Her daughter is her healthcare proxy and deferred a hematuria workup.  At her visit on 07/15/2023, She is having 1-7 daytime voids, 3 or more episodes of nocturia with strong urge to urinate.  She does have stress urinary incontinence.  She wears 3-4 pads a day.  She does limit fluid intake.  She does engage in toilet mapping.  Patient denies any modifying or aggravating factors.  Patient denies  any recent UTI's, gross hematuria, dysuria or suprapubic/flank pain.  Patient denies any fevers, chills, nausea or vomiting.    UA pyuria and bacteruria.  No symptoms of UTI at this time.  PVR 0 mL  She is having vaginal burning.   She was prescribed vaginal estrogen cream.  She was seen at her PCPs office in December for dysuria and blood on the toilet paper after wiping.  Urinalysis with pyuria, hematuria and bacteriuria.  Urine culture was positive for Enterococcus faecalis.  She is given 5 days of Cipro.  She is having 8 or more daytime voids, 3 more episodes of nocturia with strong urge to urinate.  She has stress urinary incontinence.  She is wearing 4-5 absorbent depends daily.  She does limit fluid intake.  She does engage in toilet mapping.  She still feels like her UTI is still present.  She has dysuria and dark-colored urine.  Patient denies any modifying or aggravating factors.  Patient denies any gross hematuria or suprapubic/flank pain.  Patient denies any fevers, chills, nausea or vomiting.    UA yellow hazy, 3+ leukocytes, pH 6.5, specific gravity 1.010, RBC trace, greater than 30 WBCs, 3-10 RBCs, greater than 10 epithelial cells, 0-10 renal epithelial cells and many bacteria.  PVR 82 mL    PMH: Past Medical History:  Diagnosis Date   AK (actinic keratosis) 11/12/2022   left upper arm, tx'd with EDC   AK (actinic keratosis) 11/12/2022   right medial pretibia, LN2 11/25/22   Basal cell carcinoma  06/21/2020   left post shoulder sup, left mid chest, left lat thigh   Basal cell carcinoma 11/07/2021   L upper back, EDC   Basal cell carcinoma 11/07/2021   R upper back, EDC   Basal cell carcinoma 11/27/2021   right upper arm, EDC   Basal cell carcinoma 11/27/2021   right shoulder posterior, EDC   Basal cell carcinoma 11/27/2021   right anterior shoulder, EDC   Basal cell carcinoma (BCC) 06/13/2021   left dorsal foot, EDC 10/02/2021   Basal cell carcinoma (BCC) 06/13/2021    right postauricular tx'd w/ EDC   BCC (basal cell carcinoma of skin) 03/30/2007   R inf med pretibial - BCC   BCC (basal cell carcinoma of skin) 10/12/2013   L nasal ala - BCC   BCC (basal cell carcinoma of skin) 03/10/2018   L mid dorsum med forearm - superficial BCC    BCC (basal cell carcinoma) 10/02/2021   left dorsal foot proximal, EDC 10/02/2021   BCC (basal cell carcinoma) 10/02/2021   left mid back, superficial EDC 11/07/21   BCC (basal cell carcinoma) 10/02/2021   right pretibia   BCC (basal cell carcinoma) 10/02/2021   right mid back, superficial EDC 11/07/21   BCC (basal cell carcinoma) 08/14/2022   left distal calf, tx'd with EDC   BCC (basal cell carcinoma) 11/12/2022   superficial at left lateral pretibial,l tx'd with EDC   BCC (basal cell carcinoma) 11/12/2022   superficial at mid back right of midline, scheduled for EDC   BCC (basal cell carcinoma) 11/12/2022   mid back right of midline, [facility] 11/25/22   Breast screening, unspecified 2013   Cancer (HCC) 1992   skin   Coronary artery calcification seen on CT scan    Degenerative disk disease    Diastolic dysfunction    a. 10/2021 Echo: EF 60-65%, no rwma, GrI DD, mild MR; b. 01/2023 Echo: EF 65-70%, no rwma, mild LVH, GrI DD Nl RV fxn. Triv MR. Mild-mod TR. AoV sclerosis.   GERD (gastroesophageal reflux disease)    History of basal cell carcinoma (BCC) 08/29/2020    left inferior knee lateral, , left inferior knee medial, right pretibia   History of SCC (squamous cell carcinoma) of skin 08/29/2020   right posterior calf, left lateral calf, and    Hypercholesterolemia 2008   Interstitial cystitis    followed by Dr Achilles Dunk   Near syncope    Other sign and symptom in breast 2013   Left upper outer quadrant breast "soreness" Ultrasound exam of right breast in the 2 o'clock position with the breast distracted medially showed a 0.3-0.4 with 0.5 cm simple cyst. In the 1 o'clock position where pt reported tenderness US exam was  negatiive.  Because of her history of intermittent nipple drainage, ultrasound was completed of the retroareolar area.   PAF (paroxysmal atrial fibrillation) (HCC)    a. dx 01/2023-->Eliquis 5 BID (CHA2DS2VASc = 5).   Personal history of tobacco use, presenting hazards to health    PONV (postoperative nausea and vomiting)    Primary hypertension    SCC (squamous cell carcinoma) 03/28/2008   R dorsum hand - SCC   SCC (squamous cell carcinoma) 05/09/2009   R lat lower leg - SCCIS   SCC (squamous cell carcinoma) 05/09/2009   L lat lower leg - SCCIS   SCC (squamous cell carcinoma) 04/07/2013   L lower leg - SCCIS   SCC (squamous cell carcinoma) 04/27/2013   R forearm -  SCC   SCC (squamous cell carcinoma) 04/28/2017   L lat knee - SCCIS   SCC (squamous cell carcinoma) 05/12/2018   L prox dorsum forearm - SCC   SCC (squamous cell carcinoma) 06/13/2021   left forearm tx'd w/ EDC   SCC (squamous cell carcinoma), leg, right 03/30/2007   R sup pretibial - SCCIS   SCC (squamous cell carcinoma);BCC 10/12/2013   Mid back - superficial BCC with SCCIS    Special screening for malignant neoplasms, colon    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   left dorsal forearm, left lat calf   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   left lower leg superior, EDC at follow up   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   chest right of midline, tx'd with [facility]    Surgical History: Past Surgical History:  Procedure Laterality Date   ABDOMINAL HYSTERECTOMY  1992   APPENDECTOMY     CERVICAL DISCECTOMY     S/P C7-T1 discectomy with fusion   CHOLECYSTECTOMY  1990   COLONOSCOPY WITH PROPOFOL N/A 02/14/2016   Procedure: COLONOSCOPY WITH PROPOFOL;  Surgeon: Midge Minium, MD;  Location: [facility];  Service: Endoscopy;  Laterality: N/A;  PT WOULD LIKE 10 ARRIVAL TIME OR LATER   DILATION AND CURETTAGE OF UTERUS     ESOPHAGOGASTRODUODENOSCOPY (EGD) WITH PROPOFOL N/A 02/14/2016    Procedure: ESOPHAGOGASTRODUODENOSCOPY (EGD) WITH PROPOFOL with dialtion;  Surgeon: Midge Minium, MD;  Location: [facility];  Service: Endoscopy;  Laterality: N/A;   ESOPHAGOGASTRODUODENOSCOPY (EGD) WITH PROPOFOL N/A 04/13/2019   Procedure: ESOPHAGOGASTRODUODENOSCOPY (EGD) WITH PROPOFOL;  Surgeon: Toledo, Boykin Nearing, MD;  Location: [facility];  Service: Gastroenterology;  Laterality: N/A;   HIP PINNING,CANNULATED Left 02/02/2023   Procedure: PERCUTANEOUS FIXATION OF FEMORAL NECK;  Surgeon: Juanell Fairly, MD;  Location: [facility];  Service: Orthopedics;  Laterality: Left;   MELANOMA EXCISION     removed from Left calf 1994    Home Medications:  Allergies as of 10/15/2023       Reactions   Augmentin [amoxicillin-pot Clavulanate] Swelling, Rash   Swelling of the lips   Lexapro [escitalopram Oxalate]    Micardis [telmisartan] Itching   Myrbetriq [mirabegron] Itching   Niacin And Related Itching   Codeine Sulfate Other (See Comments)   "Makes her hyper"   Vesicare [solifenacin Succinate] Nausea And Vomiting, Rash        Medication List        Accurate as of October 15, 2023 11:59 PM. If you have any questions, ask your nurse or doctor.          acetaminophen 325 MG tablet Commonly known as: TYLENOL Take 650 mg by mouth as needed.   apixaban 5 MG Tabs tablet Commonly known as: ELIQUIS Take 1 tablet (5 mg total) by mouth 2 (two) times daily.   carvedilol 25 MG tablet Commonly known as: COREG TAKE 1 TABLET BY MOUTH TWICE DAILY WITH A MEAL   esomeprazole 40 MG capsule Commonly known as: NEXIUM Take 1 capsule by mouth once daily   hydrALAZINE 25 MG tablet Commonly known as: APRESOLINE Take 1 tablet (25 mg total) by mouth 2 (two) times daily.   imipramine 10 MG tablet Commonly known as: TOFRANIL Take 10 mg by mouth at bedtime.   ipratropium 0.03 % nasal spray Commonly known as: ATROVENT Place 2 sprays into both nostrils every 12 (twelve) hours.    lovastatin 20 MG tablet Commonly known as: MEVACOR Take 1 tablet (20  mg total) by mouth at bedtime.   Premarin vaginal cream Generic drug: conjugated estrogens Place 1 Applicatorful vaginally daily. Apply 0.5mg  (pea-sized amount)  just inside the vaginal introitus with a finger-tip on  Monday, Wednesday and Friday nights.   sertraline 50 MG tablet Commonly known as: ZOLOFT Take 1 tablet (50 mg total) by mouth daily.   trospium 20 MG tablet Commonly known as: SANCTURA Take 1 tablet (20 mg total) by mouth daily.        Allergies:  Allergies  Allergen Reactions   Augmentin [Amoxicillin-Pot Clavulanate] Swelling and Rash    Swelling of the lips   Lexapro [Escitalopram Oxalate]    Micardis [Telmisartan] Itching   Myrbetriq [Mirabegron] Itching   Niacin And Related Itching   Codeine Sulfate Other (See Comments)    "Makes her hyper"   Vesicare [Solifenacin Succinate] Nausea And Vomiting and Rash    Family History: Family History  Problem Relation Age of Onset   Hodgkin's lymphoma Mother    Heart failure Father    Heart attack Father    Arthritis Sister        Three sisters w/ degeneratve disk disease   Headache Sister        Two sisters hx of headache   Breast cancer Neg Hx    Colon cancer Neg Hx    Bladder Cancer Neg Hx    Kidney cancer Neg Hx     Social History:  reports that she has quit smoking. Her smoking use included cigarettes. She has a 15 pack-year smoking history. She has never used smokeless tobacco. She reports that she does not drink alcohol and does not use drugs.  ROS: Pertinent ROS in HPI  Physical Exam: BP 123/72   Pulse 66   Ht 5\' 4"  (1.626 m)   Wt 142 lb (64.4 kg)   LMP 11/20/1976   BMI 24.37 kg/m   Constitutional:  Well nourished. Alert and oriented, No acute distress. HEENT:  AT, moist mucus membranes.  Trachea midline Cardiovascular: No clubbing, cyanosis, or edema. Respiratory: Normal respiratory effort, no increased work of  breathing. Neurologic: Grossly intact, no focal deficits, moving all 4 extremities. Psychiatric: Normal mood and affect.    Laboratory Data: Lab Results  Component Value Date   CREATININE 0.96 07/21/2023    Lab Results  Component Value Date   TSH 3.29 07/21/2023    Lab Results  Component Value Date   AST 15 07/21/2023   Lab Results  Component Value Date   ALT 8 07/21/2023   Urinalysis See HPI and EPIC  I have reviewed the labs.   Pertinent Imaging: Component     Latest Ref Rng 10/15/2023  PVR     WU 82.0      Assessment & Plan:    1. Vaginal atrophy -She tries to apply the vaginal estrogen cream when she remembers  2. OAB wet -Continue Sanctura IR 20 mg daily  3. IC -Cannot be on imipramine due to her history of QT prolongation  4. Microscopic hematuria -UA w/ micro hematuria -urine culture pending  5. Suspected UTI -UA grossly infected  -Urine culture pending -We will wait on urine culture results prior to prescribing antibiotics  Return for Pending urine culture results.  These notes generated with voice recognition software. I apologize for typographical errors.  Cloretta Ned  [facility] [address]  [address] [place], [place] [address] [phone]

## 2023-10-15 ENCOUNTER — Ambulatory Visit: Payer: Medicare PPO | Admitting: Urology

## 2023-10-15 VITALS — BP 123/72 | HR 66 | Ht 64.0 in | Wt 142.0 lb

## 2023-10-15 DIAGNOSIS — N3281 Overactive bladder: Secondary | ICD-10-CM | POA: Diagnosis not present

## 2023-10-15 DIAGNOSIS — N952 Postmenopausal atrophic vaginitis: Secondary | ICD-10-CM | POA: Diagnosis not present

## 2023-10-15 DIAGNOSIS — R3129 Other microscopic hematuria: Secondary | ICD-10-CM | POA: Diagnosis not present

## 2023-10-15 DIAGNOSIS — N3010 Interstitial cystitis (chronic) without hematuria: Secondary | ICD-10-CM | POA: Diagnosis not present

## 2023-10-15 LAB — MICROSCOPIC EXAMINATION
Epithelial Cells (non renal): >10 /[HPF] — AB (ref 0–10)
WBC, UA: >30 /[HPF] — AB (ref 0–5)

## 2023-10-15 LAB — URINALYSIS, COMPLETE
Bilirubin, UA: NEGATIVE
Glucose, UA: NEGATIVE
Ketones, UA: NEGATIVE
Nitrite, UA: NEGATIVE
Protein,UA: NEGATIVE
Specific Gravity, UA: 1.01 (ref 1.005–1.030)
Urobilinogen, Ur: 0.2 mg/dL (ref 0.2–1.0)
pH, UA: 6.5 (ref 5.0–7.5)

## 2023-10-15 LAB — BLADDER SCAN AMB NON-IMAGING: PVR: 82 WU

## 2023-10-18 ENCOUNTER — Other Ambulatory Visit: Payer: Self-pay | Admitting: Urology

## 2023-10-18 ENCOUNTER — Encounter: Payer: Self-pay | Admitting: Urology

## 2023-10-18 DIAGNOSIS — B952 Enterococcus as the cause of diseases classified elsewhere: Secondary | ICD-10-CM

## 2023-10-18 LAB — CULTURE, URINE COMPREHENSIVE

## 2023-10-18 MED ORDER — NITROFURANTOIN MONOHYD MACRO 100 MG PO CAPS: 100.0000 mg | ORAL_CAPSULE | Freq: Two times a day (BID) | ORAL | 0 refills | Status: DC

## 2023-11-05 ENCOUNTER — Telehealth: Payer: Self-pay | Admitting: Internal Medicine

## 2023-11-05 DIAGNOSIS — E7800 Pure hypercholesterolemia, unspecified: Secondary | ICD-10-CM

## 2023-11-05 DIAGNOSIS — R739 Hyperglycemia, unspecified: Secondary | ICD-10-CM

## 2023-11-05 DIAGNOSIS — D649 Anemia, unspecified: Secondary | ICD-10-CM

## 2023-11-05 NOTE — Telephone Encounter (Signed)
 Patient need lab orders.

## 2023-11-06 NOTE — Addendum Note (Signed)
 Addended by: Victorino Grates D on: 11/06/2023 09:40 AM   Modules accepted: Orders

## 2023-11-12 ENCOUNTER — Other Ambulatory Visit (INDEPENDENT_AMBULATORY_CARE_PROVIDER_SITE_OTHER): Payer: Medicare PPO

## 2023-11-12 DIAGNOSIS — E7800 Pure hypercholesterolemia, unspecified: Secondary | ICD-10-CM | POA: Diagnosis not present

## 2023-11-12 DIAGNOSIS — D649 Anemia, unspecified: Secondary | ICD-10-CM | POA: Diagnosis not present

## 2023-11-12 DIAGNOSIS — R739 Hyperglycemia, unspecified: Secondary | ICD-10-CM

## 2023-11-12 LAB — HEPATIC FUNCTION PANEL
ALT: 9 U/L (ref 0–35)
AST: 15 U/L (ref 0–37)
Albumin: 4.1 g/dL (ref 3.5–5.2)
Alkaline Phosphatase: 60 U/L (ref 39–117)
Bilirubin, Direct: 0.1 mg/dL (ref 0.0–0.3)
Total Bilirubin: 0.7 mg/dL (ref 0.2–1.2)
Total Protein: 6.4 g/dL (ref 6.0–8.3)

## 2023-11-12 LAB — BASIC METABOLIC PANEL
BUN: 9 mg/dL (ref 6–23)
CO2: 27 meq/L (ref 19–32)
Calcium: 8.7 mg/dL (ref 8.4–10.5)
Chloride: 101 meq/L (ref 96–112)
Creatinine, Ser: 1 mg/dL (ref 0.40–1.20)
GFR: 51.98 mL/min — ABNORMAL LOW (ref >60.00)
Glucose, Bld: 88 mg/dL (ref 70–99)
Potassium: 4.3 meq/L (ref 3.5–5.1)
Sodium: 134 meq/L — ABNORMAL LOW (ref 135–145)

## 2023-11-12 LAB — LIPID PANEL
Cholesterol: 161 mg/dL (ref 0–200)
HDL: 62.6 mg/dL (ref >39.00)
LDL Cholesterol: 81 mg/dL (ref 0–99)
NonHDL: 98.53
Total CHOL/HDL Ratio: 3
Triglycerides: 87 mg/dL (ref 0.0–149.0)
VLDL: 17.4 mg/dL (ref 0.0–40.0)

## 2023-11-12 LAB — HEMOGLOBIN A1C: Hgb A1c MFr Bld: 5.9 % (ref 4.6–6.5)

## 2023-11-12 LAB — TSH: TSH: 2.97 u[IU]/mL (ref 0.35–5.50)

## 2023-11-20 ENCOUNTER — Encounter: Payer: Self-pay | Admitting: Internal Medicine

## 2023-11-20 ENCOUNTER — Ambulatory Visit: Payer: Medicare PPO | Admitting: Internal Medicine

## 2023-11-20 VITALS — BP 136/72 | HR 61 | Temp 97.9°F | Resp 16 | Ht 64.0 in | Wt 146.2 lb

## 2023-11-20 DIAGNOSIS — R944 Abnormal results of kidney function studies: Secondary | ICD-10-CM | POA: Diagnosis not present

## 2023-11-20 DIAGNOSIS — E7800 Pure hypercholesterolemia, unspecified: Secondary | ICD-10-CM

## 2023-11-20 DIAGNOSIS — R739 Hyperglycemia, unspecified: Secondary | ICD-10-CM

## 2023-11-20 DIAGNOSIS — D649 Anemia, unspecified: Secondary | ICD-10-CM | POA: Diagnosis not present

## 2023-11-20 DIAGNOSIS — N898 Other specified noninflammatory disorders of vagina: Secondary | ICD-10-CM

## 2023-11-20 DIAGNOSIS — K219 Gastro-esophageal reflux disease without esophagitis: Secondary | ICD-10-CM

## 2023-11-20 DIAGNOSIS — I10 Essential (primary) hypertension: Secondary | ICD-10-CM

## 2023-11-20 DIAGNOSIS — I480 Paroxysmal atrial fibrillation: Secondary | ICD-10-CM | POA: Diagnosis not present

## 2023-11-20 DIAGNOSIS — F439 Reaction to severe stress, unspecified: Secondary | ICD-10-CM | POA: Diagnosis not present

## 2023-11-20 DIAGNOSIS — I700 Atherosclerosis of aorta: Secondary | ICD-10-CM

## 2023-11-20 MED ORDER — NYSTATIN 100000 UNIT/GM EX CREA: 1.0000 | TOPICAL_CREAM | Freq: Two times a day (BID) | CUTANEOUS | 0 refills | Status: AC

## 2023-11-20 NOTE — Progress Notes (Signed)
 Subjective:    [name], female    DOB: 1941/03/28, 83 y.o.   MRN: [id number]  Patient here for  Chief Complaint  Patient presents with   Medical Management of Chronic Issues    HPI Here for a scheduled follow up.  Here to follow up regarding hypercholesterolemia, hypertension and afib. Continues on hydralazine and coreg. Recently admitted 02/01/23 - with left femur fracture.  Is S/p percutaneous fixation of left femoral neck hip fracture on 02/02/2023. Had f/u 07/2023 - doing well post surgery and denies hip pain. Uses a cane. Saw ortho 7//24 - left distal radius fracture and nondisplaced fracture of the middle phalanx. Overall doing better.  Evaluated by urology 07/15/23 - continue trospium for OAB. Recommended vaginal estrogen cream. Saw urology 10/15/23 - recommended to continue sanctura for OAB. Treated for UTI. Recommended f/u in 2 months. She does report some peri vaginal irritation. Reports no vaginal discharge or intravaginal irritation. Noticed this irritation after having a bowel "blow out" - 3-4 weeks ago. Bowels are back to her normal. May have a bowel movement q 2 days.  Also reports some nausea in am. No vomiting. Certain foods may aggravate. Noticing worsening symptoms with greasy foods. Has had dental work. Can't eat as well.    Past Medical History:  Diagnosis Date   AK (actinic keratosis) 11/12/2022   left upper arm, tx'd with EDC   AK (actinic keratosis) 11/12/2022   right medial pretibia, LN2 11/25/22   Basal cell carcinoma 06/21/2020   left post shoulder sup, left mid chest, left lat thigh   Basal cell carcinoma 11/07/2021   L upper back, EDC   Basal cell carcinoma 11/07/2021   R upper back, EDC   Basal cell carcinoma 11/27/2021   right upper arm, EDC   Basal cell carcinoma 11/27/2021   right shoulder posterior, EDC   Basal cell carcinoma 11/27/2021   right anterior shoulder, EDC   Basal cell carcinoma (BCC) 06/13/2021   left dorsal foot, EDC  10/02/2021   Basal cell carcinoma (BCC) 06/13/2021   right postauricular tx'd w/ EDC   BCC (basal cell carcinoma of skin) 03/30/2007   R inf med pretibial - BCC   BCC (basal cell carcinoma of skin) 10/12/2013   L nasal ala - BCC   BCC (basal cell carcinoma of skin) 03/10/2018   L mid dorsum med forearm - superficial BCC    BCC (basal cell carcinoma) 10/02/2021   left dorsal foot proximal, EDC 10/02/2021   BCC (basal cell carcinoma) 10/02/2021   left mid back, superficial EDC 11/07/21   BCC (basal cell carcinoma) 10/02/2021   right pretibia   BCC (basal cell carcinoma) 10/02/2021   right mid back, superficial EDC 11/07/21   BCC (basal cell carcinoma) 08/14/2022   left distal calf, tx'd with EDC   BCC (basal cell carcinoma) 11/12/2022   superficial at left lateral pretibial,l tx'd with EDC   BCC (basal cell carcinoma) 11/12/2022   superficial at mid back right of midline, scheduled for EDC   BCC (basal cell carcinoma) 11/12/2022   mid back right of midline, [facility] 11/25/22   Breast screening, unspecified 2013   Cancer (HCC) 1992   skin   Coronary artery calcification seen on CT scan    Degenerative disk disease    Diastolic dysfunction    a. 10/2021 Echo: EF 60-65%, no rwma, GrI DD, mild MR; b. 01/2023 Echo: EF 65-70%, no rwma, mild LVH, GrI DD Nl RV fxn.  Triv MR. Mild-mod TR. AoV sclerosis.   GERD (gastroesophageal reflux disease)    History of basal cell carcinoma (BCC) 08/29/2020    left inferior knee lateral, , left inferior knee medial, right pretibia   History of SCC (squamous cell carcinoma) of skin 08/29/2020   right posterior calf, left lateral calf, and    Hypercholesterolemia 2008   Interstitial cystitis    followed by Dr Achilles Dunk   Near syncope    Other sign and symptom in breast 2013   Left upper outer quadrant breast "soreness" Ultrasound exam of right breast in the 2 o'clock position with the breast distracted medially showed a 0.3-0.4 with 0.5 cm simple cyst. In the 1 o'clock  position where pt reported tenderness US exam was negatiive.  Because of her history of intermittent nipple drainage, ultrasound was completed of the retroareolar area.   PAF (paroxysmal atrial fibrillation) (HCC)    a. dx 01/2023-->Eliquis 5 BID (CHA2DS2VASc = 5).   Personal history of tobacco use, presenting hazards to health    PONV (postoperative nausea and vomiting)    Primary hypertension    SCC (squamous cell carcinoma) 03/28/2008   R dorsum hand - SCC   SCC (squamous cell carcinoma) 05/09/2009   R lat lower leg - SCCIS   SCC (squamous cell carcinoma) 05/09/2009   L lat lower leg - SCCIS   SCC (squamous cell carcinoma) 04/07/2013   L lower leg - SCCIS   SCC (squamous cell carcinoma) 04/27/2013   R forearm - SCC   SCC (squamous cell carcinoma) 04/28/2017   L lat knee - SCCIS   SCC (squamous cell carcinoma) 05/12/2018   L prox dorsum forearm - SCC   SCC (squamous cell carcinoma) 06/13/2021   left forearm tx'd w/ EDC   SCC (squamous cell carcinoma), leg, right 03/30/2007   R sup pretibial - SCCIS   SCC (squamous cell carcinoma);BCC 10/12/2013   Mid back - superficial BCC with SCCIS    Special screening for malignant neoplasms, colon    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   left dorsal forearm, left lat calf   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   left lower leg superior, EDC at follow up   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   chest right of midline, tx'd with [facility]   Past Surgical History:  Procedure Laterality Date   ABDOMINAL HYSTERECTOMY  1992   APPENDECTOMY     CERVICAL DISCECTOMY     S/P C7-T1 discectomy with fusion   CHOLECYSTECTOMY  1990   COLONOSCOPY WITH PROPOFOL N/A 02/14/2016   Procedure: COLONOSCOPY WITH PROPOFOL;  Surgeon: Midge Minium, MD;  Location: [facility];  Service: Endoscopy;  Laterality: N/A;  PT WOULD LIKE 10 ARRIVAL TIME OR LATER   DILATION AND CURETTAGE OF UTERUS     ESOPHAGOGASTRODUODENOSCOPY (EGD)  WITH PROPOFOL N/A 02/14/2016   Procedure: ESOPHAGOGASTRODUODENOSCOPY (EGD) WITH PROPOFOL with dialtion;  Surgeon: Midge Minium, MD;  Location: [facility];  Service: Endoscopy;  Laterality: N/A;   ESOPHAGOGASTRODUODENOSCOPY (EGD) WITH PROPOFOL N/A 04/13/2019   Procedure: ESOPHAGOGASTRODUODENOSCOPY (EGD) WITH PROPOFOL;  Surgeon: Toledo, Boykin Nearing, MD;  Location: [facility] END[facility];  Service: Gastroenterology;  Laterality: N/A;   HIP PINNING,CANNULATED Left 02/02/2023   Procedure: PERCUTANEOUS FIXATION OF FEMORAL NECK;  Surgeon: Juanell Fairly, MD;  Location: [facility] OR[facility];  Service: Orthopedics;  Laterality: Left;   MELANOMA EXCISION     removed from Left calf 1994   Family History  Problem Relation Age of  Onset   Hodgkin's lymphoma Mother    Heart failure Father    Heart attack Father    Arthritis Sister        Three sisters w/ degeneratve disk disease   Headache Sister        Two sisters hx of headache   Breast cancer Neg Hx    Colon cancer Neg Hx    Bladder Cancer Neg Hx    Kidney cancer Neg Hx    Social History   Socioeconomic History   Marital status: Widowed    Spouse name: Not on file   Number of children: 2   Years of education: Not on file   Highest education level: Not on file  Occupational History   Not on file  Tobacco Use   Smoking status: Former    Current packs/day: 1.00    Average packs/day: 1 pack/day for 15.0 years (15.0 ttl pk-yrs)    Types: Cigarettes   Smokeless tobacco: Never  Vaping Use   Vaping status: Never Used  Substance and Sexual Activity   Alcohol use: No    Alcohol/week: 0.0 standard drinks of alcohol   Drug use: No   Sexual activity: Not on file  Other Topics Concern   Not on file  Social History Narrative   Married and has 2 children, daughters.   Social Drivers of Corporate investment banker Strain: Not on file  Food Insecurity: No Food Insecurity (02/11/2023)   Hunger Vital Sign    Worried About Running Out of Food in the Last Year:  Never true    Ran Out of Food in the Last Year: Never true  Transportation Needs: No Transportation Needs (02/11/2023)   PRAPARE - Administrator, Civil Service (Medical): No    Lack of Transportation (Non-Medical): No  Physical Activity: Not on file  Stress: Not on file  Social Connections: Not on file     Review of Systems  Constitutional:  Negative for appetite change and unexpected weight change.  HENT:  Negative for congestion and sinus pressure.   Respiratory:  Negative for cough, chest tightness and shortness of breath.   Cardiovascular:  Negative for chest pain, palpitations and leg swelling.  Gastrointestinal:  Negative for abdominal pain, diarrhea, nausea and vomiting.  Genitourinary:  Negative for difficulty urinating.       Peri vaginal irritation as outlined.   Musculoskeletal:  Negative for joint swelling and myalgias.  Skin:        Rash/irritation - peri vaginal area.   Neurological:  Negative for dizziness and headaches.  Psychiatric/Behavioral:  Negative for agitation and dysphoric mood.        Objective:     BP 136/72   Pulse 61   Temp 97.9 F (36.6 C)   Resp 16   Ht 5\' 4"  (1.626 m)   Wt 146 lb 3.2 oz (66.3 kg)   LMP 11/20/1976   SpO2 99%   BMI 25.10 kg/m  Wt Readings from Last 3 Encounters:  11/20/23 146 lb 3.2 oz (66.3 kg)  10/15/23 142 lb (64.4 kg)  09/18/23 134 lb 6.4 oz (61 kg)    Physical Exam Vitals reviewed.  Constitutional:      General: She is not in acute distress.    Appearance: Normal appearance.  HENT:     Head: Normocephalic and atraumatic.     Right Ear: External ear normal.     Left Ear: External ear normal.     Mouth/Throat:  Pharynx: No oropharyngeal exudate or posterior oropharyngeal erythema.  Eyes:     General: No scleral icterus.       Right eye: No discharge.        Left eye: No discharge.     Conjunctiva/sclera: Conjunctivae normal.  Neck:     Thyroid: No thyromegaly.  Cardiovascular:     Rate  and Rhythm: Normal rate and regular rhythm.  Pulmonary:     Effort: No respiratory distress.     Breath sounds: Normal breath sounds. No wheezing.  Abdominal:     General: Bowel sounds are normal.     Palpations: Abdomen is soft.     Tenderness: There is no abdominal tenderness.  Genitourinary:    Comments: Erythema - peri vaginal area.  Musculoskeletal:        General: No swelling or tenderness.     Cervical back: Neck supple. No tenderness.  Lymphadenopathy:     Cervical: No cervical adenopathy.  Skin:    Comments: Erythema = peri vaginal area.   Neurological:     Mental Status: She is alert.  Psychiatric:        Mood and Affect: Mood normal.        Behavior: Behavior normal.         Outpatient Encounter Medications as of 11/20/2023  Medication Sig   nystatin cream (MYCOSTATIN) Apply 1 Application topically 2 (two) times daily.   acetaminophen (TYLENOL) 325 MG tablet Take 650 mg by mouth as needed.   apixaban (ELIQUIS) 5 MG TABS tablet Take 1 tablet (5 mg total) by mouth 2 (two) times daily.   carvedilol (COREG) 25 MG tablet TAKE 1 TABLET BY MOUTH TWICE DAILY WITH A MEAL   conjugated estrogens (PREMARIN) vaginal cream Place 1 Applicatorful vaginally daily. Apply 0.5mg  (pea-sized amount)  just inside the vaginal introitus with a finger-tip on  Monday, Wednesday and Friday nights.   esomeprazole (NEXIUM) 40 MG capsule Take 1 capsule by mouth once daily   hydrALAZINE (APRESOLINE) 25 MG tablet Take 1 tablet (25 mg total) by mouth 2 (two) times daily.   imipramine (TOFRANIL) 10 MG tablet Take 10 mg by mouth at bedtime.   ipratropium (ATROVENT) 0.03 % nasal spray Place 2 sprays into both nostrils every 12 (twelve) hours.   lovastatin (MEVACOR) 20 MG tablet Take 1 tablet (20 mg total) by mouth at bedtime.   sertraline (ZOLOFT) 50 MG tablet Take 1 tablet (50 mg total) by mouth daily.   trospium (SANCTURA) 20 MG tablet Take 1 tablet (20 mg total) by mouth daily.   [DISCONTINUED]  nitrofurantoin, macrocrystal-monohydrate, (MACROBID) 100 MG capsule Take 1 capsule (100 mg total) by mouth every 12 (twelve) hours.   No facility-administered encounter medications on file as of 11/20/2023.     Lab Results  Component Value Date   WBC 6.6 07/21/2023   HGB 12.0 07/21/2023   HCT 36.5 07/21/2023   PLT 208.0 07/21/2023   GLUCOSE 88 11/12/2023   CHOL 161 11/12/2023   TRIG 87.0 11/12/2023   HDL 62.60 11/12/2023   LDLCALC 81 11/12/2023   ALT 9 11/12/2023   AST 15 11/12/2023   NA 134 (L) 11/12/2023   K 4.3 11/12/2023   CL 101 11/12/2023   CREATININE 1.00 11/12/2023   BUN 9 11/12/2023   CO2 27 11/12/2023   TSH 2.97 11/12/2023   HGBA1C 5.9 11/12/2023    DG ABD ACUTE 2+V W 1V CHEST Result Date: 02/04/2023 CLINICAL DATA:  Gastric outlet obstruction. EXAM: DG ABDOMEN  ACUTE WITH 1 VIEW CHEST COMPARISON:  Feb 01, 2023. FINDINGS: Mildly dilated small bowel loops are noted concerning for distal small bowel obstruction or ileus. Residual contrast is noted in urinary bladder. No radiopaque calculi or other significant radiographic abnormality is seen. Heart size and mediastinal contours are within normal limits. Mild bilateral perihilar opacities are noted concerning for pulmonary edema or inflammation, right greater than left. IMPRESSION: Dilated small bowel loops are noted concerning for distal small bowel obstruction or ileus. Bilateral lung opacities are noted, right greater than left, concerning for pulmonary edema or inflammation. Electronically Signed   By: Lupita Raider M.D.   On: 02/04/2023 12:55   ECHOCARDIOGRAM COMPLETE Result Date: 02/04/2023    ECHOCARDIOGRAM REPORT   Patient Name:   OPHA MCGHEE [facility] Date of Exam: 02/03/2023 Medical Rec #:  [id number]          Height:       64.0 in Accession #:    [id number]         Weight:       142.0 lb Date of Birth:  [birth year]          BSA:          1.691 m Patient Age:    82 years           BP:           159/63 mmHg Patient Gender: F                   HR:           115 bpm. Exam Location:  [facility] Procedure: 2D Echo, Cardiac Doppler and Color Doppler Indications:     R07.9 Chest pain  History:         Patient has prior history of Echocardiogram examinations, most                  recent 11/21/2021.  Sonographer:     Daphine Deutscher RDCS Referring Phys:  [id number] Diagnosing Phys: Yvonne Kendall MD IMPRESSIONS  1. Left ventricular ejection fraction, by estimation, is 65 to 70%. The left ventricle has normal function. The left ventricle has no regional wall motion abnormalities. There is mild left ventricular hypertrophy. Left ventricular diastolic parameters are consistent with Grade I diastolic dysfunction (impaired relaxation). Elevated left atrial pressure.  2. Right ventricular systolic function is mildly reduced. The right ventricular size is normal. There is normal pulmonary artery systolic pressure.  3. The mitral valve is normal in structure. Trivial mitral valve regurgitation. No evidence of mitral stenosis.  4. Tricuspid valve regurgitation is mild to moderate.  5. The aortic valve has an indeterminant number of cusps. There is mild calcification of the aortic valve. There is mild thickening of the aortic valve. Aortic valve regurgitation is not visualized. Aortic valve sclerosis/calcification is present, without any evidence of aortic stenosis.  6. The inferior vena cava is normal in size with <50% respiratory variability, suggesting right atrial pressure of 8 mmHg. FINDINGS  Left Ventricle: Left ventricular ejection fraction, by estimation, is 65 to 70%. The left ventricle has normal function. The left ventricle has no regional wall motion abnormalities. The left ventricular internal cavity size was normal in size. There is  mild left ventricular hypertrophy. Left ventricular diastolic parameters are consistent with Grade I diastolic dysfunction (impaired relaxation). Elevated left atrial pressure. Right Ventricle: The right  ventricular size is normal. No increase in right ventricular wall thickness. Right ventricular systolic function is  mildly reduced. There is normal pulmonary artery systolic pressure. The tricuspid regurgitant velocity is 2.50 m/s, and with an assumed right atrial pressure of 8 mmHg, the estimated right ventricular systolic pressure is 33.0 mmHg. Left Atrium: Left atrial size was normal in size. Right Atrium: Right atrial size was normal in size. Pericardium: There is no evidence of pericardial effusion. Mitral Valve: The mitral valve is normal in structure. Trivial mitral valve regurgitation. No evidence of mitral valve stenosis. Tricuspid Valve: The tricuspid valve is normal in structure. Tricuspid valve regurgitation is mild to moderate. Aortic Valve: The aortic valve has an indeterminant number of cusps. There is mild calcification of the aortic valve. There is mild thickening of the aortic valve. Aortic valve regurgitation is not visualized. Aortic valve sclerosis/calcification is present, without any evidence of aortic stenosis. Aortic valve mean gradient measures 9.0 mmHg. Aortic valve peak gradient measures 11.4 mmHg. Aortic valve area, by VTI measures 2.05 cm. Pulmonic Valve: The pulmonic valve was normal in structure. Pulmonic valve regurgitation is trivial. No evidence of pulmonic stenosis. Aorta: The aortic root is normal in size and structure. Pulmonary Artery: The pulmonary artery is of normal size. Venous: The inferior vena cava is normal in size with less than 50% respiratory variability, suggesting right atrial pressure of 8 mmHg. IAS/Shunts: The interatrial septum was not well visualized.  LEFT VENTRICLE PLAX 2D LVIDd:         3.40 cm   Diastology LVIDs:         2.30 cm   LV e' medial:    6.20 cm/s LV PW:         1.20 cm   LV E/e' medial:  17.3 LV IVS:        1.20 cm   LV e' lateral:   6.05 cm/s LVOT diam:     1.90 cm   LV E/e' lateral: 17.7 LV SV:         53 LV SV Index:   31 LVOT Area:     2.84  cm  RIGHT VENTRICLE             IVC RV Basal diam:  3.30 cm     IVC diam: 1.60 cm RV S prime:     10.22 cm/s LEFT ATRIUM             Index        RIGHT ATRIUM           Index LA diam:        4.20 cm 2.48 cm/m   RA Area:     10.90 cm LA Vol (A2C):   41.3 ml 24.42 ml/m  RA Volume:   25.70 ml  15.19 ml/m LA Vol (A4C):   36.5 ml 21.58 ml/m LA Biplane Vol: 39.2 ml 23.18 ml/m  AORTIC VALVE AV Area (Vmax):    1.83 cm AV Area (Vmean):   1.80 cm AV Area (VTI):     2.05 cm AV Vmax:           168.78 cm/s AV Vmean:          118.474 cm/s AV VTI:            0.259 m AV Peak Grad:      11.4 mmHg AV Mean Grad:      9.0 mmHg LVOT Vmax:         108.75 cm/s LVOT Vmean:        75.350 cm/s LVOT VTI:  0.188 m LVOT/AV VTI ratio: 0.72  AORTA Ao Root diam: 3.30 cm Ao Asc diam:  2.90 cm MITRAL VALVE                TRICUSPID VALVE MV Area (PHT): 2.93 cm     TR Peak grad:   25.0 mmHg MV Decel Time: 259 msec     TR Vmax:        250.00 cm/s MV E velocity: 107.14 cm/s MV A velocity: 144.50 cm/s  SHUNTS MV E/A ratio:  0.74         Systemic VTI:  0.19 m                             Systemic Diam: 1.90 cm Yvonne Kendall MD Electronically signed by Yvonne Kendall MD Signature Date/Time: 02/04/2023/7:35:40 AM    Final    CT Angio Chest Pulmonary Embolism (PE) W or WO Contrast Result Date: 02/03/2023 CLINICAL DATA:  High probability of pulmonary embolus. EXAM: CT ANGIOGRAPHY CHEST WITH CONTRAST TECHNIQUE: Multidetector CT imaging of the chest was performed using the standard protocol during bolus administration of intravenous contrast. Multiplanar CT image reconstructions and MIPs were obtained to evaluate the vascular anatomy. RADIATION DOSE REDUCTION: This exam was performed according to the departmental dose-optimization program which includes automated exposure control, adjustment of the mA and/or kV according to patient size and/or use of iterative reconstruction technique. CONTRAST:  60mL OMNIPAQUE IOHEXOL 350 MG/ML SOLN  COMPARISON:  None Available. FINDINGS: Cardiovascular: Satisfactory opacification of the pulmonary arteries to the segmental level. No evidence of pulmonary embolism. Normal heart size. No pericardial effusion. Coronary artery calcifications are noted. Mediastinum/Nodes: Thyroid gland is unremarkable. No adenopathy is noted. Severely dilated and fluid-filled esophagus is noted. Lungs/Pleura: No pneumothorax or pleural effusion is noted. Patchy airspace opacities are noted throughout the upper and lower lobes bilaterally most consistent with multifocal pneumonia. Upper Abdomen: Moderate to severe gastric distention is noted. Musculoskeletal: No chest wall abnormality. No acute or significant osseous findings. Review of the MIP images confirms the above findings. IMPRESSION: No definite evidence of pulmonary embolus. Patchy airspace opacities are noted throughout both lungs most consistent with multifocal pneumonia. Severely dilated and fluid-filled esophagus is noted as well as moderate to severe gastric distention seen in visualized portion of upper abdomen. This is concerning for gastric outlet obstruction. Coronary artery calcifications are noted suggesting coronary artery disease. Aortic Atherosclerosis (ICD10-I70.0). Electronically Signed   By: Lupita Raider M.D.   On: 02/03/2023 19:03       Assessment & Plan:  Decreased GFR -     Basic metabolic panel; Future  Gastroesophageal reflux disease, unspecified whether esophagitis present Assessment & Plan: S/p EGD - gastritis. Continue PPI. With nausea. Avoid foods that aggravate. Add pepcid as directed. Discussed further evaluation. Notify me if persistent symptoms or if desires further evaluation.    Anemia, unspecified type Assessment & Plan: Follow cbc.    Aortic atherosclerosis (HCC) Assessment & Plan: Continue mevacor.    Essential hypertension Assessment & Plan: On hydralazine and coreg. Pressure as outlined. No changes in  medication. Follow pressures. Follow metabolic panel.    Hypercholesterolemia Assessment & Plan: Continue mevacor. Low cholesterol diet and exercise. Follow lipid panel and liver function tests.    Hyperglycemia Assessment & Plan: Low carb diet and exercise. Follow met b and A1c.    PAF (paroxysmal atrial fibrillation) (HCC) Assessment & Plan: Continues on eliquis and coreg. Appears to  be in SR today. Follow.    Stress Assessment & Plan: Continue zoloft. Overall stable. Follow.    Vaginal irritation Assessment & Plan: Peri vaginal irritation as outlined. Nystatin cream as directed. Follow.    Other orders -     Nystatin; Apply 1 Application topically 2 (two) times daily.  Dispense: 30 g; Refill: 0     Dale , MD

## 2023-11-20 NOTE — Patient Instructions (Signed)
 Pepcid (famotidine) - 20mg  - take one tablet before evening meal.

## 2023-11-23 ENCOUNTER — Encounter: Payer: Self-pay | Admitting: Internal Medicine

## 2023-11-23 NOTE — Assessment & Plan Note (Signed)
Continue mevacor.  Low cholesterol diet and exercise.  Follow lipid panel and liver function tests.  

## 2023-11-23 NOTE — Assessment & Plan Note (Signed)
Continue zoloft.  Overall stable.  Follow.   

## 2023-11-23 NOTE — Assessment & Plan Note (Signed)
 S/p EGD - gastritis. Continue PPI. With nausea. Avoid foods that aggravate. Add pepcid as directed. Discussed further evaluation. Notify me if persistent symptoms or if desires further evaluation.

## 2023-11-23 NOTE — Assessment & Plan Note (Signed)
 Low-carb diet and exercise.  Follow met b and A1c.

## 2023-11-23 NOTE — Assessment & Plan Note (Signed)
 Peri vaginal irritation as outlined. Nystatin cream as directed. Follow.

## 2023-11-23 NOTE — Assessment & Plan Note (Signed)
 On hydralazine and coreg. Pressure as outlined. No changes in medication. Follow pressures. Follow metabolic panel.

## 2023-11-23 NOTE — Assessment & Plan Note (Signed)
 Continues on eliquis and coreg. Appears to be in SR today. Follow.

## 2023-11-23 NOTE — Assessment & Plan Note (Signed)
 Follow cbc.

## 2023-11-23 NOTE — Assessment & Plan Note (Signed)
Continue mevacor °

## 2023-11-30 NOTE — Progress Notes (Signed)
 12/01/2023 1:57 PM   DIONNE KNOOP Dec 03, 1940 [id number]  Referring provider: Dale [place], MD [address] [place],  [place] [address]  Urological history: 1. OAB -Contributing factors of age, GSM, hypertension, detrusor dyssynergia, hyperglycemia, COVID, constipation, antihistamines and smoking history -trospium IR 20 mg   2. IC  -contributing factors of age, GSM and constipation -Imipramine 10 mg daily -cannot take due to QT prolongation  Chief Complaint  Patient presents with   Other   HPI: Rachael Austin is a 83 y.o. female who presents today for 33-month follow-up.  Previous records reviewed.   At her visit on 05/13/2023, she is having 1-7 daytime voids with 3 or more episodes of nocturia with a severe urge to urinate.  She is having urge incontinence.  She is leaking 3 more times a day.  She wears 3-4 absorbent pads daily.  She does not limit fluid intake.  She does engage in toilet mapping.  She has been having cloudy urine and dysuria and worsening of her urinary continence since she came out of the hospital from her hip replacement in June.  Patient denies any modifying or aggravating factors.  Patient denies any recent UTI's, gross hematuria, dysuria or suprapubic/flank pain.  Patient denies any fevers, chills, nausea or vomiting.  UA yellow cloudy, specific gravity 1.015, trace heme, pH 6.5, 2+ protein, 3+ leukocytes, greater than 30 WBCs, 3-10 RBCs, mucus threads are present and many bacteria.  Urine culture  MUF.   PVR 109 mL.   Her daughter is her healthcare proxy and deferred a hematuria workup.  At her visit on 07/15/2023, She is having 1-7 daytime voids, 3 or more episodes of nocturia with strong urge to urinate.  She does have stress urinary incontinence.  She wears 3-4 pads a day.  She does limit fluid intake.  She does engage in toilet mapping.  Patient denies any modifying or aggravating factors.  Patient denies any recent UTI's,  gross hematuria, dysuria or suprapubic/flank pain.  Patient denies any fevers, chills, nausea or vomiting.    UA pyuria and bacteruria.  No symptoms of UTI at this time.  PVR 0 mL  She is having vaginal burning.   She was prescribed vaginal estrogen cream.  At her visit on 10/15/2023, she was seen at her PCPs office in December for dysuria and blood on the toilet paper after wiping.  Urinalysis with pyuria, hematuria and bacteriuria.  Urine culture was positive for Enterococcus faecalis.  She is given 5 days of Cipro.  She is having 8 or more daytime voids, 3 more episodes of nocturia with strong urge to urinate.  She has stress urinary incontinence.  She is wearing 4-5 absorbent depends daily.  She does limit fluid intake.  She does engage in toilet mapping.  She still feels like her UTI is still present.  She has dysuria and dark-colored urine.  Patient denies any modifying or aggravating factors.  Patient denies any gross hematuria or suprapubic/flank pain.  Patient denies any fevers, chills, nausea or vomiting.   UA yellow hazy, 3+ leukocytes, pH 6.5, specific gravity 1.010, RBC trace, greater than 30 WBCs, 3-10 RBCs, greater than 10 epithelial cells, 0-10 renal epithelial cells and many bacteria.  Urine culture was positive for Enterococcus faecalis.  PVR 82 mL.  She is not having any issues.  Patient denies any modifying or aggravating factors.  Patient denies any recent UTI's, gross hematuria, dysuria or suprapubic/flank pain.  Patient denies any fevers,  chills, nausea or vomiting.    She was recently treated for yeast infection.  UA yellow slightly cloudy, specific gravity 1.010, pH 6.0, nitrite positive, 2+ leukocyte, greater than 30 WBCs, 0-2 RBCs, 0-10 epithelial cells and moderate bacteria.  PVR 30 mL    PMH: Past Medical History:  Diagnosis Date   AK (actinic keratosis) 11/12/2022   left upper arm, tx'd with EDC   AK (actinic keratosis) 11/12/2022   right medial pretibia, LN2 11/25/22    Basal cell carcinoma 06/21/2020   left post shoulder sup, left mid chest, left lat thigh   Basal cell carcinoma 11/07/2021   L upper back, EDC   Basal cell carcinoma 11/07/2021   R upper back, EDC   Basal cell carcinoma 11/27/2021   right upper arm, EDC   Basal cell carcinoma 11/27/2021   right shoulder posterior, EDC   Basal cell carcinoma 11/27/2021   right anterior shoulder, EDC   Basal cell carcinoma (BCC) 06/13/2021   left dorsal foot, EDC 10/02/2021   Basal cell carcinoma (BCC) 06/13/2021   right postauricular tx'd w/ EDC   BCC (basal cell carcinoma of skin) 03/30/2007   R inf med pretibial - BCC   BCC (basal cell carcinoma of skin) 10/12/2013   L nasal ala - BCC   BCC (basal cell carcinoma of skin) 03/10/2018   L mid dorsum med forearm - superficial BCC    BCC (basal cell carcinoma) 10/02/2021   left dorsal foot proximal, EDC 10/02/2021   BCC (basal cell carcinoma) 10/02/2021   left mid back, superficial EDC 11/07/21   BCC (basal cell carcinoma) 10/02/2021   right pretibia   BCC (basal cell carcinoma) 10/02/2021   right mid back, superficial EDC 11/07/21   BCC (basal cell carcinoma) 08/14/2022   left distal calf, tx'd with EDC   BCC (basal cell carcinoma) 11/12/2022   superficial at left lateral pretibial,l tx'd with EDC   BCC (basal cell carcinoma) 11/12/2022   superficial at mid back right of midline, scheduled for EDC   BCC (basal cell carcinoma) 11/12/2022   mid back right of midline, [facility] 11/25/22   Breast screening, unspecified 2013   Cancer (HCC) 1992   skin   Coronary artery calcification seen on CT scan    Degenerative disk disease    Diastolic dysfunction    a. 10/2021 Echo: EF 60-65%, no rwma, GrI DD, mild MR; b. 01/2023 Echo: EF 65-70%, no rwma, mild LVH, GrI DD Nl RV fxn. Triv MR. Mild-mod TR. AoV sclerosis.   GERD (gastroesophageal reflux disease)    History of basal cell carcinoma (BCC) 08/29/2020    left inferior knee lateral, , left inferior knee medial,  right pretibia   History of SCC (squamous cell carcinoma) of skin 08/29/2020   right posterior calf, left lateral calf, and    Hypercholesterolemia 2008   Interstitial cystitis    followed by Dr Achilles Dunk   Near syncope    Other sign and symptom in breast 2013   Left upper outer quadrant breast "soreness" Ultrasound exam of right breast in the 2 o'clock position with the breast distracted medially showed a 0.3-0.4 with 0.5 cm simple cyst. In the 1 o'clock position where pt reported tenderness US exam was negatiive.  Because of her history of intermittent nipple drainage, ultrasound was completed of the retroareolar area.   PAF (paroxysmal atrial fibrillation) (HCC)    a. dx 01/2023-->Eliquis 5 BID (CHA2DS2VASc = 5).   Personal history of tobacco use, presenting hazards  to health    PONV (postoperative nausea and vomiting)    Primary hypertension    SCC (squamous cell carcinoma) 03/28/2008   R dorsum hand - SCC   SCC (squamous cell carcinoma) 05/09/2009   R lat lower leg - SCCIS   SCC (squamous cell carcinoma) 05/09/2009   L lat lower leg - SCCIS   SCC (squamous cell carcinoma) 04/07/2013   L lower leg - SCCIS   SCC (squamous cell carcinoma) 04/27/2013   R forearm - SCC   SCC (squamous cell carcinoma) 04/28/2017   L lat knee - SCCIS   SCC (squamous cell carcinoma) 05/12/2018   L prox dorsum forearm - SCC   SCC (squamous cell carcinoma) 06/13/2021   left forearm tx'd w/ EDC   SCC (squamous cell carcinoma), leg, right 03/30/2007   R sup pretibial - SCCIS   SCC (squamous cell carcinoma);BCC 10/12/2013   Mid back - superficial BCC with SCCIS    Special screening for malignant neoplasms, colon    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   left dorsal forearm, left lat calf   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   left lower leg superior, EDC at follow up   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   chest right of midline, tx'd with [facility]    Surgical  History: Past Surgical History:  Procedure Laterality Date   ABDOMINAL HYSTERECTOMY  1992   APPENDECTOMY     CERVICAL DISCECTOMY     S/P C7-T1 discectomy with fusion   CHOLECYSTECTOMY  1990   COLONOSCOPY WITH PROPOFOL N/A 02/14/2016   Procedure: COLONOSCOPY WITH PROPOFOL;  Surgeon: Midge Minium, MD;  Location: [facility];  Service: Endoscopy;  Laterality: N/A;  PT WOULD LIKE 10 ARRIVAL TIME OR LATER   DILATION AND CURETTAGE OF UTERUS     ESOPHAGOGASTRODUODENOSCOPY (EGD) WITH PROPOFOL N/A 02/14/2016   Procedure: ESOPHAGOGASTRODUODENOSCOPY (EGD) WITH PROPOFOL with dialtion;  Surgeon: Midge Minium, MD;  Location: [facility];  Service: Endoscopy;  Laterality: N/A;   ESOPHAGOGASTRODUODENOSCOPY (EGD) WITH PROPOFOL N/A 04/13/2019   Procedure: ESOPHAGOGASTRODUODENOSCOPY (EGD) WITH PROPOFOL;  Surgeon: Toledo, Boykin Nearing, MD;  Location: [facility];  Service: Gastroenterology;  Laterality: N/A;   HIP PINNING,CANNULATED Left 02/02/2023   Procedure: PERCUTANEOUS FIXATION OF FEMORAL NECK;  Surgeon: Juanell Fairly, MD;  Location: [facility] OR[facility];  Service: Orthopedics;  Laterality: Left;   MELANOMA EXCISION     removed from Left calf 1994    Home Medications:  Allergies as of 12/01/2023       Reactions   Augmentin [amoxicillin-pot Clavulanate] Swelling, Rash   Swelling of the lips   Lexapro [escitalopram Oxalate]    Micardis [telmisartan] Itching   Myrbetriq [mirabegron] Itching   Niacin And Related Itching   Codeine Sulfate Other (See Comments)   "Makes her hyper"   Vesicare [solifenacin Succinate] Nausea And Vomiting, Rash        Medication List        Accurate as of December 01, 2023  1:57 PM. If you have any questions, ask your nurse or doctor.          acetaminophen 325 MG tablet Commonly known as: TYLENOL Take 650 mg by mouth as needed.   apixaban 5 MG Tabs tablet Commonly known as: ELIQUIS Take 1 tablet (5 mg total) by mouth 2 (two) times daily.   carvedilol 25 MG  tablet Commonly known as: COREG TAKE 1 TABLET BY MOUTH TWICE DAILY WITH A MEAL   esomeprazole 40  MG capsule Commonly known as: NEXIUM Take 1 capsule by mouth once daily   hydrALAZINE 25 MG tablet Commonly known as: APRESOLINE Take 1 tablet (25 mg total) by mouth 2 (two) times daily.   imipramine 10 MG tablet Commonly known as: TOFRANIL Take 10 mg by mouth at bedtime.   ipratropium 0.03 % nasal spray Commonly known as: ATROVENT Place 2 sprays into both nostrils every 12 (twelve) hours.   lovastatin 20 MG tablet Commonly known as: MEVACOR Take 1 tablet (20 mg total) by mouth at bedtime.   nystatin cream Commonly known as: MYCOSTATIN Apply 1 Application topically 2 (two) times daily.   Premarin vaginal cream Generic drug: conjugated estrogens Place 1 Applicatorful vaginally daily. Apply 0.5mg  (pea-sized amount)  just inside the vaginal introitus with a finger-tip on  Monday, Wednesday and Friday nights.   sertraline 50 MG tablet Commonly known as: ZOLOFT Take 1 tablet (50 mg total) by mouth daily.   trospium 20 MG tablet Commonly known as: SANCTURA Take 1 tablet (20 mg total) by mouth daily.        Allergies:  Allergies  Allergen Reactions   Augmentin [Amoxicillin-Pot Clavulanate] Swelling and Rash    Swelling of the lips   Lexapro [Escitalopram Oxalate]    Micardis [Telmisartan] Itching   Myrbetriq [Mirabegron] Itching   Niacin And Related Itching   Codeine Sulfate Other (See Comments)    "Makes her hyper"   Vesicare [Solifenacin Succinate] Nausea And Vomiting and Rash    Family History: Family History  Problem Relation Age of Onset   Hodgkin's lymphoma Mother    Heart failure Father    Heart attack Father    Arthritis Sister        Three sisters w/ degeneratve disk disease   Headache Sister        Two sisters hx of headache   Breast cancer Neg Hx    Colon cancer Neg Hx    Bladder Cancer Neg Hx    Kidney cancer Neg Hx     Social History:   reports that she has quit smoking. Her smoking use included cigarettes. She has a 15 pack-year smoking history. She has never used smokeless tobacco. She reports that she does not drink alcohol and does not use drugs.  ROS: Pertinent ROS in HPI  Physical Exam: BP 132/69   Pulse 68   Ht 5\' 4"  (1.626 m)   Wt 146 lb (66.2 kg)   LMP 11/20/1976   BMI 25.06 kg/m   Constitutional:  Well nourished. Alert and oriented, No acute distress. HEENT: [place] AT, moist mucus membranes.  Trachea midline Cardiovascular: No clubbing, cyanosis, or edema. Respiratory: Normal respiratory effort, no increased work of breathing. Neurologic: Grossly intact, no focal deficits, moving all 4 extremities. Psychiatric: Normal mood and affect.    Laboratory Data: Lab Results  Component Value Date   CREATININE 1.00 11/12/2023    Lab Results  Component Value Date   TSH 2.97 11/12/2023    Lab Results  Component Value Date   AST 15 11/12/2023   Lab Results  Component Value Date   ALT 9 11/12/2023   Urinalysis See HPI and EPIC  I have reviewed the labs.   Pertinent Imaging:  12/01/23 13:21  Scan Result 0ml    Assessment & Plan:    1. Vaginal atrophy -She tries to apply the vaginal estrogen cream when she remembers  2. OAB wet -Continue [place] IR 20 mg daily  3. IC -Cannot be on imipramine  due to her history of QT prolongation  4. Microscopic hematuria -Today's UA w/o micro hematuria -urine culture pending -could still consider a RUS as she did have an episode of micro heme with a negative culture in August -she would like to discuss this further with Dr. Lorin Picket   5. Chronic bacteriuria vs UTI -UA w/ bacteria and leuks -Urine culture pending -We will wait on urine culture results prior to prescribing antibiotics  These notes generated with voice recognition software. I apologize for typographical errors.  Cloretta Ned  [facility] [address]  [address] [place], [place] [address] [phone]

## 2023-12-01 ENCOUNTER — Encounter: Payer: Self-pay | Admitting: Urology

## 2023-12-01 ENCOUNTER — Ambulatory Visit: Payer: Self-pay | Admitting: Urology

## 2023-12-01 VITALS — BP 132/69 | HR 68 | Ht 64.0 in | Wt 146.0 lb

## 2023-12-01 DIAGNOSIS — N3281 Overactive bladder: Secondary | ICD-10-CM | POA: Diagnosis not present

## 2023-12-01 DIAGNOSIS — N952 Postmenopausal atrophic vaginitis: Secondary | ICD-10-CM

## 2023-12-01 DIAGNOSIS — R3129 Other microscopic hematuria: Secondary | ICD-10-CM

## 2023-12-01 DIAGNOSIS — R3989 Other symptoms and signs involving the genitourinary system: Secondary | ICD-10-CM

## 2023-12-01 DIAGNOSIS — N3010 Interstitial cystitis (chronic) without hematuria: Secondary | ICD-10-CM

## 2023-12-01 LAB — MICROSCOPIC EXAMINATION: WBC, UA: >30 /HPF — AB (ref 0–5)

## 2023-12-01 LAB — URINALYSIS, COMPLETE
Bilirubin, UA: NEGATIVE
Glucose, UA: NEGATIVE
Ketones, UA: NEGATIVE
Nitrite, UA: POSITIVE — AB
Protein,UA: NEGATIVE
RBC, UA: NEGATIVE
Specific Gravity, UA: 1.01 (ref 1.005–1.030)
Urobilinogen, Ur: 0.2 mg/dL (ref 0.2–1.0)
pH, UA: 6 (ref 5.0–7.5)

## 2023-12-01 LAB — BLADDER SCAN AMB NON-IMAGING

## 2023-12-04 ENCOUNTER — Encounter: Payer: Self-pay | Admitting: Internal Medicine

## 2023-12-04 LAB — CULTURE, URINE COMPREHENSIVE

## 2023-12-04 NOTE — Telephone Encounter (Signed)
 See message.  Cholesterol looks good. Kidney function has been stable. Sodium overall stable. I am not sure which labs she is referring to. Can schedule an appt to discuss if has concerns.

## 2023-12-07 NOTE — Telephone Encounter (Signed)
 Pt scheduled for 12/18/23 to discuss

## 2023-12-07 NOTE — Telephone Encounter (Signed)
 See attached message. Ok to schedule appt to discuss and for further evaluation.

## 2023-12-15 ENCOUNTER — Other Ambulatory Visit: Payer: Self-pay | Admitting: Nurse Practitioner

## 2023-12-15 NOTE — Telephone Encounter (Signed)
 Prescription refill request for Eliquis received. Indication:afib Last office visit:9/24 Scr:1.00  2/25 Age: 83 Weight:66.2  kg  Prescription refilled

## 2023-12-18 ENCOUNTER — Other Ambulatory Visit: Payer: Medicare PPO

## 2023-12-18 ENCOUNTER — Ambulatory Visit: Admitting: Internal Medicine

## 2023-12-18 VITALS — BP 120/70 | HR 69 | Temp 98.0°F | Resp 16 | Ht 64.0 in | Wt 145.8 lb

## 2023-12-18 DIAGNOSIS — F439 Reaction to severe stress, unspecified: Secondary | ICD-10-CM

## 2023-12-18 DIAGNOSIS — E538 Deficiency of other specified B group vitamins: Secondary | ICD-10-CM | POA: Diagnosis not present

## 2023-12-18 DIAGNOSIS — K5900 Constipation, unspecified: Secondary | ICD-10-CM

## 2023-12-18 DIAGNOSIS — R944 Abnormal results of kidney function studies: Secondary | ICD-10-CM

## 2023-12-18 DIAGNOSIS — R400 Somnolence: Secondary | ICD-10-CM

## 2023-12-18 DIAGNOSIS — K219 Gastro-esophageal reflux disease without esophagitis: Secondary | ICD-10-CM

## 2023-12-18 DIAGNOSIS — E7800 Pure hypercholesterolemia, unspecified: Secondary | ICD-10-CM

## 2023-12-18 DIAGNOSIS — I700 Atherosclerosis of aorta: Secondary | ICD-10-CM

## 2023-12-18 DIAGNOSIS — I10 Essential (primary) hypertension: Secondary | ICD-10-CM | POA: Diagnosis not present

## 2023-12-18 DIAGNOSIS — I480 Paroxysmal atrial fibrillation: Secondary | ICD-10-CM | POA: Diagnosis not present

## 2023-12-18 DIAGNOSIS — J189 Pneumonia, unspecified organism: Secondary | ICD-10-CM

## 2023-12-18 DIAGNOSIS — R319 Hematuria, unspecified: Secondary | ICD-10-CM

## 2023-12-18 DIAGNOSIS — R739 Hyperglycemia, unspecified: Secondary | ICD-10-CM | POA: Diagnosis not present

## 2023-12-18 LAB — BASIC METABOLIC PANEL
BUN: 9 mg/dL (ref 6–23)
CO2: 28 meq/L (ref 19–32)
Calcium: 8.8 mg/dL (ref 8.4–10.5)
Chloride: 101 meq/L (ref 96–112)
Creatinine, Ser: 0.89 mg/dL (ref 0.40–1.20)
GFR: 59.74 mL/min — ABNORMAL LOW (ref >60.00)
Glucose, Bld: 93 mg/dL (ref 70–99)
Potassium: 4.4 meq/L (ref 3.5–5.1)
Sodium: 136 meq/L (ref 135–145)

## 2023-12-18 LAB — VITAMIN B12: Vitamin B-12: 436 pg/mL (ref 211–911)

## 2023-12-18 MED ORDER — CYANOCOBALAMIN 1000 MCG/ML IJ SOLN
1000.0000 ug | Freq: Once | INTRAMUSCULAR | Status: AC
Start: 2023-12-18 — End: 2023-12-18
  Administered 2023-12-18: 1000 ug via INTRAMUSCULAR

## 2023-12-18 NOTE — Progress Notes (Signed)
 Subjective:    [name], female    DOB: May 21, 1941, 84 y.o.   MRN: [id number]  Patient here for No chief complaint on file.   HPI Here for a work in appt - work in to discuss recent labs. She is accompanied by her daughter. History obtained from both of them.  Had f/u with urology 12/01/23 - vaginal atrophy - esterogen cream. Recommended to continue [facility] IR and recommended a renal ultrasound. Had questions regarding her urine results. Concerned regarding findings in the urine. Discussed red blood cells and white blood cells - noted in urinalysis. Per review, urine checked urology - no red blood cells. Discussed decreased GFR and I am in agreement to check renal ultrasound - as recommended by urology. She reports she feels she is doing relatively well. She does report staying sleepy. Does snore. Discussed possible sleep apnea and w/up. She wants to hold on further testing. No chest pain or sob reported. Reports occasional constipation. Last visit GERD - added pepcid.    Past Medical History:  Diagnosis Date   AK (actinic keratosis) 11/12/2022   left upper arm, tx'd with EDC   AK (actinic keratosis) 11/12/2022   right medial pretibia, LN2 11/25/22   Basal cell carcinoma 06/21/2020   left post shoulder sup, left mid chest, left lat thigh   Basal cell carcinoma 11/07/2021   L upper back, EDC   Basal cell carcinoma 11/07/2021   R upper back, EDC   Basal cell carcinoma 11/27/2021   right upper arm, EDC   Basal cell carcinoma 11/27/2021   right shoulder posterior, EDC   Basal cell carcinoma 11/27/2021   right anterior shoulder, EDC   Basal cell carcinoma (BCC) 06/13/2021   left dorsal foot, EDC 10/02/2021   Basal cell carcinoma (BCC) 06/13/2021   right postauricular tx'd w/ EDC   BCC (basal cell carcinoma of skin) 03/30/2007   R inf med pretibial - BCC   BCC (basal cell carcinoma of skin) 10/12/2013   L nasal ala - BCC   BCC (basal cell carcinoma of skin)  03/10/2018   L mid dorsum med forearm - superficial BCC    BCC (basal cell carcinoma) 10/02/2021   left dorsal foot proximal, EDC 10/02/2021   BCC (basal cell carcinoma) 10/02/2021   left mid back, superficial EDC 11/07/21   BCC (basal cell carcinoma) 10/02/2021   right pretibia   BCC (basal cell carcinoma) 10/02/2021   right mid back, superficial EDC 11/07/21   BCC (basal cell carcinoma) 08/14/2022   left distal calf, tx'd with EDC   BCC (basal cell carcinoma) 11/12/2022   superficial at left lateral pretibial,l tx'd with EDC   BCC (basal cell carcinoma) 11/12/2022   superficial at mid back right of midline, scheduled for EDC   BCC (basal cell carcinoma) 11/12/2022   mid back right of midline, [facility] 11/25/22   Breast screening, unspecified 2013   Cancer (HCC) 1992   skin   Coronary artery calcification seen on CT scan    Degenerative disk disease    Diastolic dysfunction    a. 10/2021 Echo: EF 60-65%, no rwma, GrI DD, mild MR; b. 01/2023 Echo: EF 65-70%, no rwma, mild LVH, GrI DD Nl RV fxn. Triv MR. Mild-mod TR. AoV sclerosis.   GERD (gastroesophageal reflux disease)    History of basal cell carcinoma (BCC) 08/29/2020    left inferior knee lateral, , left inferior knee medial, right pretibia   History of SCC (squamous cell  carcinoma) of skin 08/29/2020   right posterior calf, left lateral calf, and    Hypercholesterolemia 2008   Interstitial cystitis    followed by Dr Achilles Dunk   Near syncope    Other sign and symptom in breast 2013   Left upper outer quadrant breast "soreness" Ultrasound exam of right breast in the 2 o'clock position with the breast distracted medially showed a 0.3-0.4 with 0.5 cm simple cyst. In the 1 o'clock position where pt reported tenderness US exam was negatiive.  Because of her history of intermittent nipple drainage, ultrasound was completed of the retroareolar area.   PAF (paroxysmal atrial fibrillation) (HCC)    a. dx 01/2023-->Eliquis 5 BID (CHA2DS2VASc = 5).    Personal history of tobacco use, presenting hazards to health    PONV (postoperative nausea and vomiting)    Primary hypertension    SCC (squamous cell carcinoma) 03/28/2008   R dorsum hand - SCC   SCC (squamous cell carcinoma) 05/09/2009   R lat lower leg - SCCIS   SCC (squamous cell carcinoma) 05/09/2009   L lat lower leg - SCCIS   SCC (squamous cell carcinoma) 04/07/2013   L lower leg - SCCIS   SCC (squamous cell carcinoma) 04/27/2013   R forearm - SCC   SCC (squamous cell carcinoma) 04/28/2017   L lat knee - SCCIS   SCC (squamous cell carcinoma) 05/12/2018   L prox dorsum forearm - SCC   SCC (squamous cell carcinoma) 06/13/2021   left forearm tx'd w/ EDC   SCC (squamous cell carcinoma), leg, right 03/30/2007   R sup pretibial - SCCIS   SCC (squamous cell carcinoma);BCC 10/12/2013   Mid back - superficial BCC with SCCIS    Special screening for malignant neoplasms, colon    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   left dorsal forearm, left lat calf   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   left lower leg superior, EDC at follow up   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   chest right of midline, tx'd with [facility]   Past Surgical History:  Procedure Laterality Date   ABDOMINAL HYSTERECTOMY  1992   APPENDECTOMY     CERVICAL DISCECTOMY     S/P C7-T1 discectomy with fusion   CHOLECYSTECTOMY  1990   COLONOSCOPY WITH PROPOFOL N/A 02/14/2016   Procedure: COLONOSCOPY WITH PROPOFOL;  Surgeon: Midge Minium, MD;  Location[facility];  Service: Endoscopy;  Laterality: N/A;  PT WOULD LIKE 10 ARRIVAL TIME OR LATER   DILATION AND CURETTAGE OF UTERUS     ESOPHAGOGASTRODUODENOSCOPY (EGD) WITH PROPOFOL N/A 02/14/2016   Procedure: ESOPHAGOGASTRODUODENOSCOPY (EGD) WITH PROPOFOL with dialtion;  Surgeon: Midge Minium, MD;  Location: [facility];  Service: Endoscopy;  Laterality: N/A;   ESOPHAGOGASTRODUODENOSCOPY (EGD) WITH PROPOFOL N/A 04/13/2019    Procedure: ESOPHAGOGASTRODUODENOSCOPY (EGD) WITH PROPOFOL;  Surgeon: Toledo, Boykin Nearing, MD;  Location: [facility] ENDOSCOPY;  Service: Gastroenterology;  Laterality: N/A;   HIP PINNING,CANNULATED Left 02/02/2023   Procedure: PERCUTANEOUS FIXATION OF FEMORAL NECK;  Surgeon: Juanell Fairly, MD;  Location: [facility] ORS;  Service: Orthopedics;  Laterality: Left;   MELANOMA EXCISION     removed from Left calf 1994   Family History  Problem Relation Age of Onset   Hodgkin's lymphoma Mother    Heart failure Father    Heart attack Father    Arthritis Sister        Three sisters w/ degeneratve disk disease   Headache Sister  Two sisters hx of headache   Breast cancer Neg Hx    Colon cancer Neg Hx    Bladder Cancer Neg Hx    Kidney cancer Neg Hx    Social History   Socioeconomic History   Marital status: Widowed    Spouse name: Not on file   Number of children: 2   Years of education: Not on file   Highest education level: Not on file  Occupational History   Not on file  Tobacco Use   Smoking status: Former    Current packs/day: 1.00    Average packs/day: 1 pack/day for 15.0 years (15.0 ttl pk-yrs)    Types: Cigarettes   Smokeless tobacco: Never  Vaping Use   Vaping status: Never Used  Substance and Sexual Activity   Alcohol use: No    Alcohol/week: 0.0 standard drinks of alcohol   Drug use: No   Sexual activity: Not on file  Other Topics Concern   Not on file  Social History Narrative   Married and has 2 children, daughters.   Social Drivers of Corporate investment banker Strain: Not on file  Food Insecurity: No Food Insecurity (02/11/2023)   Hunger Vital Sign    Worried About Running Out of Food in the Last Year: Never true    Ran Out of Food in the Last Year: Never true  Transportation Needs: No Transportation Needs (02/11/2023)   PRAPARE - Administrator, Civil Service (Medical): No    Lack of Transportation (Non-Medical): No  Physical Activity: Not on file   Stress: Not on file  Social Connections: Not on file     Review of Systems  Constitutional:  Negative for appetite change and unexpected weight change.  HENT:  Negative for congestion and sinus pressure.   Respiratory:  Negative for cough, chest tightness and shortness of breath.   Cardiovascular:  Negative for chest pain, palpitations and leg swelling.  Gastrointestinal:  Negative for abdominal pain, diarrhea, nausea and vomiting.       Occasional constipation.   Genitourinary:  Negative for difficulty urinating and dysuria.  Musculoskeletal:  Negative for joint swelling and myalgias.  Skin:  Negative for color change and rash.  Neurological:  Negative for dizziness and headaches.  Psychiatric/Behavioral:  Negative for agitation and dysphoric mood.        Objective:     BP 120/70   Pulse 69   Temp 98 F (36.7 C)   Resp 16   Ht 5\' 4"  (1.626 m)   Wt 145 lb 12.8 oz (66.1 kg)   LMP 11/20/1976   SpO2 98%   BMI 25.03 kg/m  Wt Readings from Last 3 Encounters:  12/18/23 145 lb 12.8 oz (66.1 kg)  12/01/23 146 lb (66.2 kg)  11/20/23 146 lb 3.2 oz (66.3 kg)    Physical Exam Vitals reviewed.  Constitutional:      General: She is not in acute distress.    Appearance: Normal appearance.  HENT:     Head: Normocephalic and atraumatic.     Right Ear: External ear normal.     Left Ear: External ear normal.     Mouth/Throat:     Pharynx: No oropharyngeal exudate or posterior oropharyngeal erythema.  Eyes:     General: No scleral icterus.       Right eye: No discharge.        Left eye: No discharge.     Conjunctiva/sclera: Conjunctivae normal.  Neck:  Thyroid: No thyromegaly.  Cardiovascular:     Rate and Rhythm: Normal rate and regular rhythm.  Pulmonary:     Effort: No respiratory distress.     Breath sounds: Normal breath sounds. No wheezing.  Abdominal:     General: Bowel sounds are normal.     Palpations: Abdomen is soft.     Tenderness: There is no abdominal  tenderness.  Musculoskeletal:        General: No swelling or tenderness.     Cervical back: Neck supple. No tenderness.  Lymphadenopathy:     Cervical: No cervical adenopathy.  Skin:    Findings: No erythema or rash.  Neurological:     Mental Status: She is alert.  Psychiatric:        Mood and Affect: Mood normal.        Behavior: Behavior normal.         Outpatient Encounter Medications as of 12/18/2023  Medication Sig   acetaminophen (TYLENOL) 325 MG tablet Take 650 mg by mouth as needed.   apixaban (ELIQUIS) 5 MG TABS tablet Take 1 tablet by mouth twice daily   carvedilol (COREG) 25 MG tablet TAKE 1 TABLET BY MOUTH TWICE DAILY WITH A MEAL   conjugated estrogens (PREMARIN) vaginal cream Place 1 Applicatorful vaginally daily. Apply 0.5mg  (pea-sized amount)  just inside the vaginal introitus with a finger-tip on  Monday, Wednesday and Friday nights.   esomeprazole (NEXIUM) 40 MG capsule Take 1 capsule by mouth once daily   hydrALAZINE (APRESOLINE) 25 MG tablet Take 1 tablet (25 mg total) by mouth 2 (two) times daily.   imipramine (TOFRANIL) 10 MG tablet Take 10 mg by mouth at bedtime.   ipratropium (ATROVENT) 0.03 % nasal spray Place 2 sprays into both nostrils every 12 (twelve) hours.   lovastatin (MEVACOR) 20 MG tablet Take 1 tablet (20 mg total) by mouth at bedtime.   nystatin cream (MYCOSTATIN) Apply 1 Application topically 2 (two) times daily.   sertraline (ZOLOFT) 50 MG tablet Take 1 tablet (50 mg total) by mouth daily.   trospium (SANCTURA) 20 MG tablet Take 1 tablet (20 mg total) by mouth daily.   [EXPIRED] cyanocobalamin (VITAMIN B12) injection 1,000 mcg    No facility-administered encounter medications on file as of 12/18/2023.     Lab Results  Component Value Date   WBC 6.6 07/21/2023   HGB 12.0 07/21/2023   HCT 36.5 07/21/2023   PLT 208.0 07/21/2023   GLUCOSE 93 12/18/2023   CHOL 161 11/12/2023   TRIG 87.0 11/12/2023   HDL 62.60 11/12/2023   LDLCALC 81  11/12/2023   ALT 9 11/12/2023   AST 15 11/12/2023   NA 136 12/18/2023   K 4.4 12/18/2023   CL 101 12/18/2023   CREATININE 0.89 12/18/2023   BUN 9 12/18/2023   CO2 28 12/18/2023   TSH 2.97 11/12/2023   HGBA1C 5.9 11/12/2023    DG ABD ACUTE 2+V W 1V CHEST Result Date: 02/04/2023 CLINICAL DATA:  Gastric outlet obstruction. EXAM: DG ABDOMEN ACUTE WITH 1 VIEW CHEST COMPARISON:  Feb 01, 2023. FINDINGS: Mildly dilated small bowel loops are noted concerning for distal small bowel obstruction or ileus. Residual contrast is noted in urinary bladder. No radiopaque calculi or other significant radiographic abnormality is seen. Heart size and mediastinal contours are within normal limits. Mild bilateral perihilar opacities are noted concerning for pulmonary edema or inflammation, right greater than left. IMPRESSION: Dilated small bowel loops are noted concerning for distal small bowel obstruction or  ileus. Bilateral lung opacities are noted, right greater than left, concerning for pulmonary edema or inflammation. Electronically Signed   By: Lupita Raider M.D.   On: 02/04/2023 12:55   ECHOCARDIOGRAM COMPLETE Result Date: 02/04/2023    ECHOCARDIOGRAM REPORT   Patient Name:   Rachael Austin [facility] Date of Exam: 02/03/2023 Medical Rec #:  [id number]          Height:       64.0 in Accession #:    [id number]         Weight:       142.0 lb Date of Birth:  1941-07-04          BSA:          1.691 m Patient Age:    82 years           BP:           159/63 mmHg Patient Gender: F                  HR:           115 bpm. Exam Location:  [facility] Procedure: 2D Echo, Cardiac Doppler and Color Doppler Indications:     R07.9 Chest pain  History:         Patient has prior history of Echocardiogram examinations, most                  recent 11/21/2021.  Sonographer:     Daphine Deutscher RDCS Referring Phys:  [id number] END Diagnosing Phys: Yvonne Kendall MD IMPRESSIONS  1. Left ventricular ejection fraction, by estimation, is 65 to  70%. The left ventricle has normal function. The left ventricle has no regional wall motion abnormalities. There is mild left ventricular hypertrophy. Left ventricular diastolic parameters are consistent with Grade I diastolic dysfunction (impaired relaxation). Elevated left atrial pressure.  2. Right ventricular systolic function is mildly reduced. The right ventricular size is normal. There is normal pulmonary artery systolic pressure.  3. The mitral valve is normal in structure. Trivial mitral valve regurgitation. No evidence of mitral stenosis.  4. Tricuspid valve regurgitation is mild to moderate.  5. The aortic valve has an indeterminant number of cusps. There is mild calcification of the aortic valve. There is mild thickening of the aortic valve. Aortic valve regurgitation is not visualized. Aortic valve sclerosis/calcification is present, without any evidence of aortic stenosis.  6. The inferior vena cava is normal in size with <50% respiratory variability, suggesting right atrial pressure of 8 mmHg. FINDINGS  Left Ventricle: Left ventricular ejection fraction, by estimation, is 65 to 70%. The left ventricle has normal function. The left ventricle has no regional wall motion abnormalities. The left ventricular internal cavity size was normal in size. There is  mild left ventricular hypertrophy. Left ventricular diastolic parameters are consistent with Grade I diastolic dysfunction (impaired relaxation). Elevated left atrial pressure. Right Ventricle: The right ventricular size is normal. No increase in right ventricular wall thickness. Right ventricular systolic function is mildly reduced. There is normal pulmonary artery systolic pressure. The tricuspid regurgitant velocity is 2.50 m/s, and with an assumed right atrial pressure of 8 mmHg, the estimated right ventricular systolic pressure is 33.0 mmHg. Left Atrium: Left atrial size was normal in size. Right Atrium: Right atrial size was normal in size.  Pericardium: There is no evidence of pericardial effusion. Mitral Valve: The mitral valve is normal in structure. Trivial mitral valve regurgitation. No evidence of mitral valve stenosis. Tricuspid Valve:  The tricuspid valve is normal in structure. Tricuspid valve regurgitation is mild to moderate. Aortic Valve: The aortic valve has an indeterminant number of cusps. There is mild calcification of the aortic valve. There is mild thickening of the aortic valve. Aortic valve regurgitation is not visualized. Aortic valve sclerosis/calcification is present, without any evidence of aortic stenosis. Aortic valve mean gradient measures 9.0 mmHg. Aortic valve peak gradient measures 11.4 mmHg. Aortic valve area, by VTI measures 2.05 cm. Pulmonic Valve: The pulmonic valve was normal in structure. Pulmonic valve regurgitation is trivial. No evidence of pulmonic stenosis. Aorta: The aortic root is normal in size and structure. Pulmonary Artery: The pulmonary artery is of normal size. Venous: The inferior vena cava is normal in size with less than 50% respiratory variability, suggesting right atrial pressure of 8 mmHg. IAS/Shunts: The interatrial septum was not well visualized.  LEFT VENTRICLE PLAX 2D LVIDd:         3.40 cm   Diastology LVIDs:         2.30 cm   LV e' medial:    6.20 cm/s LV PW:         1.20 cm   LV E/e' medial:  17.3 LV IVS:        1.20 cm   LV e' lateral:   6.05 cm/s LVOT diam:     1.90 cm   LV E/e' lateral: 17.7 LV SV:         53 LV SV Index:   31 LVOT Area:     2.84 cm  RIGHT VENTRICLE             IVC RV Basal diam:  3.30 cm     IVC diam: 1.60 cm RV S prime:     10.22 cm/s LEFT ATRIUM             Index        RIGHT ATRIUM           Index LA diam:        4.20 cm 2.48 cm/m   RA Area:     10.90 cm LA Vol (A2C):   41.3 ml 24.42 ml/m  RA Volume:   25.70 ml  15.19 ml/m LA Vol (A4C):   36.5 ml 21.58 ml/m LA Biplane Vol: 39.2 ml 23.18 ml/m  AORTIC VALVE AV Area (Vmax):    1.83 cm AV Area (Vmean):   1.80 cm  AV Area (VTI):     2.05 cm AV Vmax:           168.78 cm/s AV Vmean:          118.474 cm/s AV VTI:            0.259 m AV Peak Grad:      11.4 mmHg AV Mean Grad:      9.0 mmHg LVOT Vmax:         108.75 cm/s LVOT Vmean:        75.350 cm/s LVOT VTI:          0.188 m LVOT/AV VTI ratio: 0.72  AORTA Ao Root diam: 3.30 cm Ao Asc diam:  2.90 cm MITRAL VALVE                TRICUSPID VALVE MV Area (PHT): 2.93 cm     TR Peak grad:   25.0 mmHg MV Decel Time: 259 msec     TR Vmax:        250.00 cm/s MV E velocity: 107.14  cm/s MV A velocity: 144.50 cm/s  SHUNTS MV E/A ratio:  0.74         Systemic VTI:  0.19 m                             Systemic Diam: 1.90 cm Yvonne Kendall MD Electronically signed by Yvonne Kendall MD Signature Date/Time: 02/04/2023/7:35:40 AM    Final    CT Angio Chest Pulmonary Embolism (PE) W or WO Contrast Result Date: 02/03/2023 CLINICAL DATA:  High probability of pulmonary embolus. EXAM: CT ANGIOGRAPHY CHEST WITH CONTRAST TECHNIQUE: Multidetector CT imaging of the chest was performed using the standard protocol during bolus administration of intravenous contrast. Multiplanar CT image reconstructions and MIPs were obtained to evaluate the vascular anatomy. RADIATION DOSE REDUCTION: This exam was performed according to the departmental dose-optimization program which includes automated exposure control, adjustment of the mA and/or kV according to patient size and/or use of iterative reconstruction technique. CONTRAST:  60mL OMNIPAQUE IOHEXOL 350 MG/ML SOLN COMPARISON:  None Available. FINDINGS: Cardiovascular: Satisfactory opacification of the pulmonary arteries to the segmental level. No evidence of pulmonary embolism. Normal heart size. No pericardial effusion. Coronary artery calcifications are noted. Mediastinum/Nodes: Thyroid gland is unremarkable. No adenopathy is noted. Severely dilated and fluid-filled esophagus is noted. Lungs/Pleura: No pneumothorax or pleural effusion is noted. Patchy airspace  opacities are noted throughout the upper and lower lobes bilaterally most consistent with multifocal pneumonia. Upper Abdomen: Moderate to severe gastric distention is noted. Musculoskeletal: No chest wall abnormality. No acute or significant osseous findings. Review of the MIP images confirms the above findings. IMPRESSION: No definite evidence of pulmonary embolus. Patchy airspace opacities are noted throughout both lungs most consistent with multifocal pneumonia. Severely dilated and fluid-filled esophagus is noted as well as moderate to severe gastric distention seen in visualized portion of upper abdomen. This is concerning for gastric outlet obstruction. Coronary artery calcifications are noted suggesting coronary artery disease. Aortic Atherosclerosis (ICD10-I70.0). Electronically Signed   By: Lupita Raider M.D.   On: 02/03/2023 19:03       Assessment & Plan:  Essential hypertension Assessment & Plan: On hydralazine and coreg. Pressure as outlined. No changes in medication. Follow pressures. Follow metabolic panel.   Orders: -     Basic metabolic panel  B12 deficiency Assessment & Plan: Has not received B12 injection recently. Check B12 level today. B12 injection today.   Orders: -     Vitamin B12 -     Cyanocobalamin  Stress Assessment & Plan: Continue zoloft. Overall stable.    PAF (paroxysmal atrial fibrillation) (HCC) Assessment & Plan: Appears to be in SR today. Continue coreg and eliquis. Stable.    Multifocal pneumonia Assessment & Plan: Diagnosed in hospital and treated.  Breathing back to baseline. No increased cough or congestion.  Needs f/u cxr.  Follow up cxr - no pneumonia.    Hyperglycemia Assessment & Plan: Low carb diet and exercise. Follow met b and A1c.    Hypercholesterolemia Assessment & Plan: Continue mevacor. Low cholesterol diet and exercise. Follow lipid panel and liver function tests.    Hematuria, unspecified type Assessment &  Plan: Recently evaluated by urology. No red blood cells. Recommended renal ultrasound. Discussed today. Agreeable.    Gastroesophageal reflux disease, unspecified whether esophagitis present Assessment & Plan: No upper symptoms reported. Continue nexium.    Constipation, unspecified constipation type Assessment & Plan: Stay hydrated. Follow.    Aortic atherosclerosis (HCC)  Assessment & Plan: Continue mevacor.    Decreased GFR Assessment & Plan: Discussed. Also discussed recent urology visit. No red blood cells in their urine check. They did recommend a renal ultrasound. Discussed with her today. She is agreeable.   Orders: -     US RENAL; Future  Daytime somnolence Assessment & Plan: Discussed possible sleep apnea. Discussed further testing. She wants to hold on further testing. Follow. Avoid sleeping supine. Avoid sedating medication. Follow.       Dale Hotchkiss, MD

## 2023-12-19 ENCOUNTER — Encounter: Payer: Self-pay | Admitting: Internal Medicine

## 2023-12-19 DIAGNOSIS — R400 Somnolence: Secondary | ICD-10-CM | POA: Insufficient documentation

## 2023-12-19 DIAGNOSIS — R944 Abnormal results of kidney function studies: Secondary | ICD-10-CM | POA: Insufficient documentation

## 2023-12-19 NOTE — Assessment & Plan Note (Signed)
 Appears to be in SR today. Continue coreg and eliquis. Stable.

## 2023-12-19 NOTE — Assessment & Plan Note (Signed)
 Low-carb diet and exercise.  Follow met b and A1c.

## 2023-12-19 NOTE — Assessment & Plan Note (Signed)
No upper symptoms reported.  Continue nexium.  

## 2023-12-19 NOTE — Assessment & Plan Note (Signed)
 On hydralazine and coreg. Pressure as outlined. No changes in medication. Follow pressures. Follow metabolic panel.

## 2023-12-19 NOTE — Assessment & Plan Note (Signed)
 Recently evaluated by urology. No red blood cells. Recommended renal ultrasound. Discussed today. Agreeable.

## 2023-12-19 NOTE — Assessment & Plan Note (Signed)
Stay hydrated.  Follow.  

## 2023-12-19 NOTE — Assessment & Plan Note (Signed)
Continue mevacor °

## 2023-12-19 NOTE — Assessment & Plan Note (Signed)
 Discussed. Also discussed recent urology visit. No red blood cells in their urine check. They did recommend a renal ultrasound. Discussed with her today. She is agreeable.

## 2023-12-19 NOTE — Assessment & Plan Note (Signed)
 Has not received B12 injection recently. Check B12 level today. B12 injection today.

## 2023-12-19 NOTE — Assessment & Plan Note (Signed)
 Diagnosed in hospital and treated.  Breathing back to baseline. No increased cough or congestion.  Needs f/u cxr.  Follow up cxr - no pneumonia.

## 2023-12-19 NOTE — Assessment & Plan Note (Signed)
Continue mevacor.  Low cholesterol diet and exercise.  Follow lipid panel and liver function tests.  

## 2023-12-19 NOTE — Assessment & Plan Note (Signed)
 Discussed possible sleep apnea. Discussed further testing. She wants to hold on further testing. Follow. Avoid sleeping supine. Avoid sedating medication. Follow.

## 2023-12-19 NOTE — Assessment & Plan Note (Signed)
 Continue zoloft. Overall stable.

## 2023-12-21 ENCOUNTER — Telehealth: Payer: Self-pay | Admitting: *Deleted

## 2023-12-21 NOTE — Telephone Encounter (Signed)
 Received PA for Prolia, but never heard back if pt wants to proceed or note. Mychart message sent.

## 2023-12-29 ENCOUNTER — Ambulatory Visit
Admission: RE | Admit: 2023-12-29 | Discharge: 2023-12-29 | Disposition: A | Discharge status: Home / Self Care | Source: Ambulatory Visit | Attending: Internal Medicine | Admitting: Internal Medicine

## 2023-12-29 DIAGNOSIS — R944 Abnormal results of kidney function studies: Secondary | ICD-10-CM | POA: Diagnosis not present

## 2024-01-05 ENCOUNTER — Encounter: Payer: Self-pay | Admitting: Dermatology

## 2024-01-05 ENCOUNTER — Ambulatory Visit: Payer: Medicare PPO | Admitting: Dermatology

## 2024-01-05 DIAGNOSIS — Z85828 Personal history of other malignant neoplasm of skin: Secondary | ICD-10-CM

## 2024-01-05 DIAGNOSIS — D492 Neoplasm of unspecified behavior of bone, soft tissue, and skin: Secondary | ICD-10-CM | POA: Diagnosis not present

## 2024-01-05 DIAGNOSIS — L821 Other seborrheic keratosis: Secondary | ICD-10-CM

## 2024-01-05 DIAGNOSIS — L570 Actinic keratosis: Secondary | ICD-10-CM | POA: Diagnosis not present

## 2024-01-05 DIAGNOSIS — L910 Hypertrophic scar: Secondary | ICD-10-CM

## 2024-01-05 DIAGNOSIS — Z872 Personal history of diseases of the skin and subcutaneous tissue: Secondary | ICD-10-CM

## 2024-01-05 DIAGNOSIS — L820 Inflamed seborrheic keratosis: Secondary | ICD-10-CM | POA: Diagnosis not present

## 2024-01-05 DIAGNOSIS — L578 Other skin changes due to chronic exposure to nonionizing radiation: Secondary | ICD-10-CM

## 2024-01-05 DIAGNOSIS — W908XXA Exposure to other nonionizing radiation, initial encounter: Secondary | ICD-10-CM | POA: Diagnosis not present

## 2024-01-05 NOTE — Progress Notes (Signed)
 Follow-Up Visit   Subjective  Rachael Austin is a 83 y.o. female who presents for the following: Spots on back and lower legs. Hx of multiple BCCs, SCCs and AKs.   Dr. Katrinka Blazing noted a lesion on right lower leg and posterior neck at last visit.  Will recheck at this appointment. Patient states she is leaving this afternoon for [place], [place], and does not want anything major done today. Pink spots on legs, and itchy spots on back.  The patient has spots, moles and lesions to be evaluated, some may be new or changing and the patient may have concern these could be cancer.   The following portions of the chart were reviewed this encounter and updated as appropriate: medications, allergies, medical history  Review of Systems:  No other skin or systemic complaints except as noted in HPI or Assessment and Plan.  Objective  Well appearing patient in no apparent distress; mood and affect are within normal limits.  A focused examination was performed of the following areas: Face, back, legs  Relevant physical exam findings are noted in the Assessment and Plan.  L pretibial x3, R pretibial x2, R dorsal wrist x1, L hand dorsum x1 (6) Erythematous thin papules/macules with gritty scale.  Back x7 (7) Erythematous keratotic or waxy stuck-on papule       Assessment & Plan   ACTINIC DAMAGE - chronic, secondary to cumulative UV radiation exposure/sun exposure over time - diffuse scaly erythematous macules with underlying dyspigmentation - Recommend daily broad spectrum sunscreen SPF 30+ to sun-exposed areas, reapply every 2 hours as needed.  - Recommend staying in the shade or wearing long sleeves, sun glasses (UVA+UVB protection) and wide brim hats (4-inch brim around the entire circumference of the hat). - Call for new or changing lesions.   SEBORRHEIC KERATOSIS - Stuck-on, waxy, tan-brown papules and/or plaques  - Benign-appearing - Discussed benign etiology and prognosis. -  Observe - Call for any changes  SEBORRHEIC KERATOSIS vs other - R calf 2.5 cm waxy pink-tan patch, photo today - Benign-appearing.  Observation.  Call clinic for new or changing lesions.  Recommend daily use of broad spectrum spf 30+ sunscreen to sun-exposed areas.  Observe for now - Call for any changes    HYPERTROPHIC SCARS Exam: indurated plaque at left mid back at bra line, R spinal mid back.   Benign-appearing.  Call clinic for new or changing lesions. Discussed option of intralesional triamcinolone if this becomes bothersome. Pt states very itchy  Treatment Plan:  Procedure Note Intralesional Injection  Location: Left mid back at bra line, right spinal mid back  Informed Consent: Discussed risks (infection, pain, bleeding, bruising, thinning of the skin, loss of skin pigment, lack of resolution, and recurrence of lesion) and benefits of the procedure, as well as the alternatives. Informed consent was obtained. Preparation: The area was prepared a standard fashion.  Anesthesia: None  Procedure Details: An intralesional injection was performed with Kenalog 40 mg/cc. 0.4 cc in total were injected to both sites [facility] #: [id number] Exp: 12/27/2024 Lot: [id number]  Total number of injections: >7  Plan: The patient was instructed on post-op care. Recommend OTC analgesia as needed for pain.  Intralesional steroid injection side effects were reviewed including thinning of the skin and discoloration, such as redness, lightening or darkening.   HISTORY OF SQUAMOUS CELL CARCINOMA OF THE SKIN - No evidence of recurrence today - Recommend regular full body skin exams - Recommend daily broad spectrum sunscreen SPF 30+ to  sun-exposed areas, reapply every 2 hours as needed.  - Call if any new or changing lesions are noted between office visits   HISTORY OF BASAL CELL CARCINOMA OF THE SKIN - No evidence of recurrence today - Recommend regular full body skin exams - Recommend daily broad  spectrum sunscreen SPF 30+ to sun-exposed areas, reapply every 2 hours as needed.  - Call if any new or changing lesions are noted between office visits   Neoplasm; R/O BCC 1.2 cm pink pearly macule at posterior neck Clinically suspect BCC. Patient defers biopsy today. Will biopsy/EDC at next visit.   AK (ACTINIC KERATOSIS) (6) L pretibial x3, R pretibial x2, R dorsal wrist x1, L hand dorsum x1 (6) Actinic keratoses are precancerous spots that appear secondary to cumulative UV radiation exposure/sun exposure over time. They are chronic with expected duration over 1 year. A portion of actinic keratoses will progress to squamous cell carcinoma of the skin. It is not possible to reliably predict which spots will progress to skin cancer and so treatment is recommended to prevent development of skin cancer.  Recommend daily broad spectrum sunscreen SPF 30+ to sun-exposed areas, reapply every 2 hours as needed.  Recommend staying in the shade or wearing long sleeves, sun glasses (UVA+UVB protection) and wide brim hats (4-inch brim around the entire circumference of the hat). Call for new or changing lesions. Destruction of lesion - L pretibial x3, R pretibial x2, R dorsal wrist x1, L hand dorsum x1 (6)  Destruction method: cryotherapy   Informed consent: discussed and consent obtained   Lesion destroyed using liquid nitrogen: Yes   Region frozen until ice ball extended beyond lesion: Yes   Outcome: patient tolerated procedure well with no complications   Post-procedure details: wound care instructions given   Additional details:  Prior to procedure, discussed risks of blister formation, small wound, skin dyspigmentation, or rare scar following cryotherapy. Recommend Vaseline ointment to treated areas while healing.  INFLAMED SEBORRHEIC KERATOSIS (7) Back x7 (7) Symptomatic, irritating, patient would like treated. Destruction of lesion - Back x7 (7)  Destruction method: cryotherapy   Informed  consent: discussed and consent obtained   Lesion destroyed using liquid nitrogen: Yes   Region frozen until ice ball extended beyond lesion: Yes   Outcome: patient tolerated procedure well with no complications   Post-procedure details: wound care instructions given   Additional details:  Prior to procedure, discussed risks of blister formation, small wound, skin dyspigmentation, or rare scar following cryotherapy. Recommend Vaseline ointment to treated areas while healing.    Return for AK Follow Up, biopsy in 4-6 weeks.  I, Lawson Radar, CMA, am acting as scribe for Willeen Niece, MD.   Documentation: I have reviewed the above documentation for accuracy and completeness, and I agree with the above.  Willeen Niece, MD

## 2024-01-05 NOTE — Progress Notes (Deleted)
   Follow-Up Visit   Subjective  Rachael Austin is a 83 y.o. female who presents for the following: Skin Cancer Screening and Full Body Skin Exam. Hx of BCCs, Hx of SCCs, Hx of AKs  The patient presents for Total-Body Skin Exam (TBSE) for skin cancer screening and mole check. The patient has spots, moles and lesions to be evaluated, some may be new or changing and the patient may have concern these could be cancer.    The following portions of the chart were reviewed this encounter and updated as appropriate: medications, allergies, medical history  Review of Systems:  No other skin or systemic complaints except as noted in HPI or Assessment and Plan.  Objective  Well appearing patient in no apparent distress; mood and affect are within normal limits.  A full examination was performed including scalp, head, eyes, ears, nose, lips, neck, chest, axillae, abdomen, back, buttocks, bilateral upper extremities, bilateral lower extremities, hands, feet, fingers, toes, fingernails, and toenails. All findings within normal limits unless otherwise noted below.   Relevant physical exam findings are noted in the Assessment and Plan.    Assessment & Plan   SKIN CANCER SCREENING PERFORMED TODAY.  ACTINIC DAMAGE - Chronic condition, secondary to cumulative UV/sun exposure - diffuse scaly erythematous macules with underlying dyspigmentation - Recommend daily broad spectrum sunscreen SPF 30+ to sun-exposed areas, reapply every 2 hours as needed.  - Staying in the shade or wearing long sleeves, sun glasses (UVA+UVB protection) and wide brim hats (4-inch brim around the entire circumference of the hat) are also recommended for sun protection.  - Call for new or changing lesions.  LENTIGINES, SEBORRHEIC KERATOSES, HEMANGIOMAS - Benign normal skin lesions - Benign-appearing - Call for any changes  MELANOCYTIC NEVI - Tan-brown and/or pink-flesh-colored symmetric macules and papules - Benign  appearing on exam today - Observation - Call clinic for new or changing moles - Recommend daily use of broad spectrum spf 30+ sunscreen to sun-exposed areas.        No follow-ups on file.  I, Lawson Radar, CMA, am acting as scribe for Willeen Niece, MD.   Documentation: I have reviewed the above documentation for accuracy and completeness, and I agree with the above.  Willeen Niece, MD

## 2024-01-05 NOTE — Patient Instructions (Addendum)
 Cryotherapy Aftercare  Wash gently with soap and water everyday.   Apply Vaseline Jelly or Aquaphor daily until healed.    Intralesional steroid injection side effects were reviewed including thinning of the skin and discoloration, such as redness, lightening or darkening.    Recommend daily broad spectrum sunscreen SPF 30+ to sun-exposed areas, reapply every 2 hours as needed. Call for new or changing lesions.  Staying in the shade or wearing long sleeves, sun glasses (UVA+UVB protection) and wide brim hats (4-inch brim around the entire circumference of the hat) are also recommended for sun protection.      Due to recent changes in healthcare laws, you may see results of your pathology and/or laboratory studies on MyChart before the doctors have had a chance to review them. We understand that in some cases there may be results that are confusing or concerning to you. Please understand that not all results are received at the same time and often the doctors may need to interpret multiple results in order to provide you with the best plan of care or course of treatment. Therefore, we ask that you please give [place] 2 business days to thoroughly review all your results before contacting the office for clarification. Should we see a critical lab result, you will be contacted sooner.   If You Need Anything After Your Visit  If you have any questions or concerns for your doctor, please call our main line at [phone] and press option 4 to reach your doctor's medical assistant. If no one answers, please leave a voicemail as directed and we will return your call as soon as possible. Messages left after 4 pm will be answered the following business day.   You may also send [place] a message via MyChart. We typically respond to MyChart messages within 1-2 business days.  For prescription refills, please ask your pharmacy to contact our office. Our fax number is [phone].  If you have an urgent issue  when the clinic is closed that cannot wait until the next business day, you can page your doctor at the number below.    Please note that while we do our best to be available for urgent issues outside of office hours, we are not available 24/7.   If you have an urgent issue and are unable to reach [place], you may choose to seek medical care at your doctor's office, retail clinic, urgent care center, or emergency room.  If you have a medical emergency, please immediately call 911 or go to the emergency department.  Pager Numbers  - Dr. Gwen Pounds: [phone]  - Dr. Roseanne Reno: [phone]  - Dr. Katrinka Blazing: [phone]   In the event of inclement weather, please call our main line at [phone] for an update on the status of any delays or closures.  Dermatology Medication Tips: Please keep the boxes that topical medications come in in order to help keep track of the instructions about where and how to use these. Pharmacies typically print the medication instructions only on the boxes and not directly on the medication tubes.   If your medication is too expensive, please contact our office at [phone] option 4 or send [place] a message through MyChart.   We are unable to tell what your co-pay for medications will be in advance as this is different depending on your insurance coverage. However, we may be able to find a substitute medication at lower cost or fill out paperwork to get insurance to cover a needed medication.  If a prior authorization is required to get your medication covered by your insurance company, please allow [place] 1-2 business days to complete this process.  Drug prices often vary depending on where the prescription is filled and some pharmacies may offer cheaper prices.  The website www.goodrx.com contains coupons for medications through different pharmacies. The prices here do not account for what the cost may be with help from insurance (it may be cheaper with your insurance),  but the website can give you the price if you did not use any insurance.  - You can print the associated coupon and take it with your prescription to the pharmacy.  - You may also stop by our office during regular business hours and pick up a GoodRx coupon card.  - If you need your prescription sent electronically to a different pharmacy, notify our office through [facility] or by phone at [phone] option 4.     Si Usted Necesita Algo Despus de Su Visita  Tambin puede enviarnos un mensaje a travs de Clinical cytogeneticist. Por lo general respondemos a los mensajes de MyChart en el transcurso de 1 a 2 das hbiles.  Para renovar recetas, por favor pida a su farmacia que se ponga en contacto con nuestra oficina. Annie Sable de fax es [place] [phone].  Si tiene un asunto urgente cuando la clnica est cerrada y que no puede esperar hasta el siguiente da hbil, puede llamar/localizar a su doctor(a) al nmero que aparece a continuacin.   Por favor, tenga en cuenta que aunque hacemos todo lo posible para estar disponibles para asuntos urgentes fuera del horario de [place], no estamos disponibles las 24 horas del da, los 7 [address]  [address] de la [place].   Si tiene un problema urgente y no puede comunicarse con nosotros, puede optar por buscar atencin mdica  en el consultorio de su doctor(a), en una clnica privada, en un centro de atencin urgente o en una sala de emergencias.  Si tiene Engineer, drilling, por favor llame inmediatamente al 911 o vaya a la sala de emergencias.  Nmeros de bper  - Dr. Gwen Pounds: [phone]  - Dra. Roseanne Reno: [phone]  - Dr. Katrinka Blazing: [phone]   En caso de inclemencias del tiempo, por favor llame a Lacy Duverney principal al [phone] para una actualizacin sobre el [place] de cualquier retraso o cierre.  Consejos para la medicacin en dermatologa: Por favor, guarde las cajas en las que vienen los medicamentos de uso tpico para ayudarle a seguir las  instrucciones sobre dnde y cmo usarlos. Las farmacias generalmente imprimen las instrucciones del medicamento slo en las cajas y no directamente en los tubos del [place].   Si su medicamento es muy caro, por favor, pngase en contacto con Rolm Gala llamando al [phone] y presione la opcin 4 o envenos un mensaje a travs de Clinical cytogeneticist.   No podemos decirle cul ser su copago por los medicamentos por adelantado ya que esto es diferente dependiendo de la cobertura de su seguro. Sin embargo, es posible que podamos encontrar un medicamento sustituto a Audiological scientist un formulario para que el seguro cubra el medicamento que se considera necesario.   Si se requiere una autorizacin previa para que su compaa de seguros [place] su medicamento, por favor permtanos de 1 a 2 das hbiles para completar [address]  Los precios de los medicamentos varan con frecuencia dependiendo del Environmental consultant de dnde se surte la receta y alguna farmacias pueden ofrecer precios ms baratos.  El sitio web  www.goodrx.com tiene cupones para medicamentos de Health and safety inspector. Los precios aqu no tienen en cuenta lo que podra costar con la ayuda del seguro (puede ser ms barato con su seguro), pero el sitio web puede darle el precio si no utiliz Tourist information centre manager.  - Puede imprimir el cupn correspondiente y llevarlo con su receta a la farmacia.  - Tambin puede pasar por nuestra oficina durante el horario de atencin regular y Education officer, museum una tarjeta de cupones de GoodRx.  - Si necesita que su receta se enve electrnicamente a una farmacia diferente, informe a nuestra oficina a travs de MyChart de [place] o por telfono llamando al [phone] y presione la opcin 4.

## 2024-01-28 ENCOUNTER — Ambulatory Visit: Payer: Medicare PPO | Admitting: Internal Medicine

## 2024-01-28 VITALS — BP 120/72 | HR 72 | Temp 98.0°F | Resp 16 | Ht 64.0 in | Wt 146.2 lb

## 2024-01-28 DIAGNOSIS — I10 Essential (primary) hypertension: Secondary | ICD-10-CM | POA: Diagnosis not present

## 2024-01-28 DIAGNOSIS — K219 Gastro-esophageal reflux disease without esophagitis: Secondary | ICD-10-CM | POA: Diagnosis not present

## 2024-01-28 DIAGNOSIS — F439 Reaction to severe stress, unspecified: Secondary | ICD-10-CM | POA: Diagnosis not present

## 2024-01-28 DIAGNOSIS — I480 Paroxysmal atrial fibrillation: Secondary | ICD-10-CM | POA: Diagnosis not present

## 2024-01-28 DIAGNOSIS — I700 Atherosclerosis of aorta: Secondary | ICD-10-CM

## 2024-01-28 DIAGNOSIS — D649 Anemia, unspecified: Secondary | ICD-10-CM | POA: Diagnosis not present

## 2024-01-28 DIAGNOSIS — N3010 Interstitial cystitis (chronic) without hematuria: Secondary | ICD-10-CM | POA: Diagnosis not present

## 2024-01-28 DIAGNOSIS — R739 Hyperglycemia, unspecified: Secondary | ICD-10-CM

## 2024-01-28 DIAGNOSIS — E7800 Pure hypercholesterolemia, unspecified: Secondary | ICD-10-CM

## 2024-01-28 MED ORDER — SERTRALINE HCL 50 MG PO TABS: 50.0000 mg | ORAL_TABLET | Freq: Every day | ORAL | 1 refills | Status: DC

## 2024-01-28 MED ORDER — HYDRALAZINE HCL 25 MG PO TABS
25.0000 mg | ORAL_TABLET | Freq: Two times a day (BID) | ORAL | 3 refills | Status: AC
Start: 2024-01-28 — End: ?

## 2024-01-28 MED ORDER — LOVASTATIN 20 MG PO TABS: 20.0000 mg | ORAL_TABLET | Freq: Every day | ORAL | 1 refills | Status: DC

## 2024-01-28 NOTE — Progress Notes (Unsigned)
 Subjective:    [name], female    DOB: 02-25-1941, 83 y.o.   MRN: [id number]  Patient here for  Chief Complaint  Patient presents with  . Medical Management of Chronic Issues    HPI Here for a scheduled follow up - follow up regarding hypertension, hypercholesterolemia, PAF and increased stress. Had f/u with urology 12/01/23 - vaginal atrophy - esterogen cream. Recommended to continue sanctura  IR and recommended a renal ultrasound. Renal ultrasound 12/30/23 - no acute abnormality.    Past Medical History:  Diagnosis Date  . AK (actinic keratosis) 11/12/2022   left upper arm, tx'd with EDC  . AK (actinic keratosis) 11/12/2022   right medial pretibia, LN2 11/25/22  . Basal cell carcinoma 06/21/2020   left post shoulder sup, left mid chest, left lat thigh  . Basal cell carcinoma 11/07/2021   L upper back, EDC  . Basal cell carcinoma 11/07/2021   R upper back, EDC  . Basal cell carcinoma 11/27/2021   right upper arm, EDC  . Basal cell carcinoma 11/27/2021   right shoulder posterior, EDC  . Basal cell carcinoma 11/27/2021   right anterior shoulder, EDC  . Basal cell carcinoma (BCC) 06/13/2021   left dorsal foot, EDC 10/02/2021  . Basal cell carcinoma (BCC) 06/13/2021   right postauricular tx'd w/ EDC  . BCC (basal cell carcinoma of skin) 03/30/2007   R inf med pretibial - BCC  . BCC (basal cell carcinoma of skin) 10/12/2013   L nasal ala - BCC  . BCC (basal cell carcinoma of skin) 03/10/2018   L mid dorsum med forearm - superficial BCC   . BCC (basal cell carcinoma) 10/02/2021   left dorsal foot proximal, EDC 10/02/2021  . BCC (basal cell carcinoma) 10/02/2021   left mid back, superficial EDC 11/07/21  . BCC (basal cell carcinoma) 10/02/2021   right pretibia  . BCC (basal cell carcinoma) 10/02/2021   right mid back, superficial EDC 11/07/21  . BCC (basal cell carcinoma) 08/14/2022   left distal calf, tx'd with EDC  . BCC (basal cell carcinoma) 11/12/2022    superficial at left lateral pretibial,l tx'd with EDC  . BCC (basal cell carcinoma) 11/12/2022   superficial at mid back right of midline, scheduled for EDC  . BCC (basal cell carcinoma) 11/12/2022   mid back right of midline, [facility] 11/25/22  . Breast screening, unspecified 2013  . Cancer (HCC) 1992   skin  . Coronary artery calcification seen on CT scan   . Degenerative disk disease   . Diastolic dysfunction    a. 10/2021 Echo: EF 60-65%, no rwma, GrI DD, mild MR; b. 01/2023 Echo: EF 65-70%, no rwma, mild LVH, GrI DD Nl RV fxn. Triv MR. Mild-mod TR. AoV sclerosis.  Aaron Aas GERD (gastroesophageal reflux disease)   . History of basal cell carcinoma (BCC) 08/29/2020    left inferior knee lateral, , left inferior knee medial, right pretibia  . History of SCC (squamous cell carcinoma) of skin 08/29/2020   right posterior calf, left lateral calf, and   . Hypercholesterolemia 2008  . Interstitial cystitis    followed by Dr Aram Knights  . Near syncope   . Other sign and symptom in breast 2013   Left upper outer quadrant breast "soreness" Ultrasound exam of right breast in the 2 o'clock position with the breast distracted medially showed a 0.3-0.4 with 0.5 cm simple cyst. In the 1 o'clock position where pt reported tenderness US  exam was negatiive.  Because of her history of intermittent nipple drainage, ultrasound was completed of the retroareolar area.  Aaron Aas PAF (paroxysmal atrial fibrillation) (HCC)    a. dx 01/2023-->Eliquis  5 BID (CHA2DS2VASc = 5).  . Personal history of tobacco use, presenting hazards to health   . PONV (postoperative nausea and vomiting)   . Primary hypertension   . SCC (squamous cell carcinoma) 03/28/2008   R dorsum hand - SCC  . SCC (squamous cell carcinoma) 05/09/2009   R lat lower leg - SCCIS  . SCC (squamous cell carcinoma) 05/09/2009   L lat lower leg - SCCIS  . SCC (squamous cell carcinoma) 04/07/2013   L lower leg - SCCIS  . SCC (squamous cell carcinoma) 04/27/2013   R forearm  - SCC  . SCC (squamous cell carcinoma) 04/28/2017   L lat knee - SCCIS  . SCC (squamous cell carcinoma) 05/12/2018   L prox dorsum forearm - SCC  . SCC (squamous cell carcinoma) 06/13/2021   left forearm tx'd w/ EDC  . SCC (squamous cell carcinoma), leg, right 03/30/2007   R sup pretibial - SCCIS  . SCC (squamous cell carcinoma);BCC 10/12/2013   Mid back - superficial BCC with SCCIS   . Special screening for malignant neoplasms, colon   . 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   left dorsal forearm, left lat calf  . 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   left lower leg superior, EDC at follow up  . 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   chest right of midline, tx'd with [facility]   Past Surgical History:  Procedure Laterality Date  . ABDOMINAL HYSTERECTOMY  1992  . APPENDECTOMY    . CERVICAL DISCECTOMY     S/P C7-T1 discectomy with fusion  . CHOLECYSTECTOMY  1990  . COLONOSCOPY WITH PROPOFOL  N/A 02/14/2016   Procedure: COLONOSCOPY WITH PROPOFOL ;  Surgeon: Marnee Sink, MD;  Location: [facility];  Service: Endoscopy;  Laterality: N/A;  PT WOULD LIKE 10 ARRIVAL TIME OR LATER  . DILATION AND CURETTAGE OF UTERUS    . ESOPHAGOGASTRODUODENOSCOPY (EGD) WITH PROPOFOL  N/A 02/14/2016   Procedure: ESOPHAGOGASTRODUODENOSCOPY (EGD) WITH PROPOFOL  with dialtion;  Surgeon: Marnee Sink, MD;  Location: [facility];  Service: Endoscopy;  Laterality: N/A;  . ESOPHAGOGASTRODUODENOSCOPY (EGD) WITH PROPOFOL  N/A 04/13/2019   Procedure: ESOPHAGOGASTRODUODENOSCOPY (EGD) WITH PROPOFOL ;  Surgeon: Toledo, Alphonsus Jeans, MD;  Location: [facility];  Service: Gastroenterology;  Laterality: N/A;  . HIP PINNING,CANNULATED Left 02/02/2023   Procedure: PERCUTANEOUS FIXATION OF FEMORAL NECK;  Surgeon: Rande Bushy, MD;  Location: [facility];  Service: Orthopedics;  Laterality: Left;  Aaron Aas MELANOMA EXCISION     removed from Left calf 1994   Family History  Problem Relation Age of  Onset  . Hodgkin's lymphoma Mother   . Heart failure Father   . Heart attack Father   . Arthritis Sister        Three sisters w/ degeneratve disk disease  . Headache Sister        Two sisters hx of headache  . Breast cancer Neg Hx   . Colon cancer Neg Hx   . Bladder Cancer Neg Hx   . Kidney cancer Neg Hx    Social History   Socioeconomic History  . Marital status: Widowed    Spouse name: Not on file  . Number of children: 2  . Years of education: Not on file  . Highest education level: Not on file  Occupational History  . Not on file  Tobacco Use  . Smoking status: Former    Current packs/day: 1.00    Average packs/day: 1 pack/day for 15.0 years (15.0 ttl pk-yrs)    Types: Cigarettes  . Smokeless tobacco: Never  Vaping Use  . Vaping status: Never Used  Substance and Sexual Activity  . Alcohol use: No    Alcohol/week: 0.0 standard drinks of alcohol  . Drug use: No  . Sexual activity: Not on file  Other Topics Concern  . Not on file  Social History Narrative   Married and has 2 children, daughters.   Social Drivers of Corporate investment banker Strain: Not on file  Food Insecurity: No Food Insecurity (02/11/2023)   Hunger Vital Sign   . Worried About Programme researcher, broadcasting/film/video in the Last Year: Never true   . Ran Out of Food in the Last Year: Never true  Transportation Needs: No Transportation Needs (02/11/2023)   PRAPARE - Transportation   . Lack of Transportation (Medical): No   . Lack of Transportation (Non-Medical): No  Physical Activity: Not on file  Stress: Not on file  Social Connections: Not on file     Review of Systems     Objective:     BP 120/72   Pulse 72   Temp 98 F (36.7 C)   Resp 16   Ht 5\' 4"  (1.626 m)   Wt 146 lb 3.2 oz (66.3 kg)   LMP 11/20/1976   SpO2 98%   BMI 25.10 kg/m  Wt Readings from Last 3 Encounters:  01/28/24 146 lb 3.2 oz (66.3 kg)  12/18/23 145 lb 12.8 oz (66.1 kg)  12/01/23 146 lb (66.2 kg)    Physical  Exam  {Perform Simple Foot Exam  Perform Detailed exam:1} {Insert foot Exam (Optional):30965}   Outpatient Encounter Medications as of 01/28/2024  Medication Sig  . acetaminophen  (TYLENOL ) 325 MG tablet Take 650 mg by mouth as needed.  . apixaban  (ELIQUIS ) 5 MG TABS tablet Take 1 tablet by mouth twice daily  . carvedilol  (COREG ) 25 MG tablet TAKE 1 TABLET BY MOUTH TWICE DAILY WITH A MEAL  . conjugated estrogens  (PREMARIN ) vaginal cream Place 1 Applicatorful vaginally daily. Apply 0.5mg  (pea-sized amount)  just inside the vaginal introitus with a finger-tip on  Monday, Wednesday and Friday nights.  . esomeprazole  (NEXIUM ) 40 MG capsule Take 1 capsule by mouth once daily  . hydrALAZINE  (APRESOLINE ) 25 MG tablet Take 1 tablet (25 mg total) by mouth 2 (two) times daily.  . imipramine  (TOFRANIL ) 10 MG tablet Take 10 mg by mouth at bedtime.  Aaron Aas ipratropium (ATROVENT ) 0.03 % nasal spray Place 2 sprays into both nostrils every 12 (twelve) hours.  . lovastatin  (MEVACOR ) 20 MG tablet Take 1 tablet (20 mg total) by mouth at bedtime.  . nystatin  cream (MYCOSTATIN ) Apply 1 Application topically 2 (two) times daily.  . sertraline  (ZOLOFT ) 50 MG tablet Take 1 tablet (50 mg total) by mouth daily.  . trospium  (SANCTURA ) 20 MG tablet Take 1 tablet (20 mg total) by mouth daily.   No facility-administered encounter medications on file as of 01/28/2024.     Lab Results  Component Value Date   WBC 6.6 07/21/2023   HGB 12.0 07/21/2023   HCT 36.5 07/21/2023   PLT 208.0 07/21/2023   GLUCOSE 93 12/18/2023   CHOL 161 11/12/2023   TRIG 87.0 11/12/2023   HDL 62.60 11/12/2023   LDLCALC 81 11/12/2023   ALT 9 11/12/2023   AST 15 11/12/2023  NA 136 12/18/2023   K 4.4 12/18/2023   CL 101 12/18/2023   CREATININE 0.89 12/18/2023   BUN 9 12/18/2023   CO2 28 12/18/2023   TSH 2.97 11/12/2023   HGBA1C 5.9 11/12/2023    US  Renal Result Date: 12/30/2023 CLINICAL DATA:  Decreased GFR EXAM: RENAL / URINARY TRACT  ULTRASOUND COMPLETE COMPARISON:  Limited abdomen ultrasound December 09, 2010 FINDINGS: Right Kidney: Renal measurements: 11.2 x 3.7 x 4.7 cm = volume: 101 mL. Echogenicity within normal limits. No mass or hydronephrosis visualized. Left Kidney: Renal measurements: 7.6 x 3.7 x 3.9 cm = volume: 57 mL. Echogenicity within normal limits. No mass or hydronephrosis visualized. Bladder: Appears normal for degree of bladder distention. Other: None. IMPRESSION: No acute abnormality identified. Electronically Signed   By: Anna Barnes M.D.   On: 12/30/2023 09:08       Assessment & Plan:  Hypercholesterolemia  Anemia, unspecified type  Hyperglycemia     Dellar Fenton, MD

## 2024-01-31 ENCOUNTER — Encounter: Payer: Self-pay | Admitting: Internal Medicine

## 2024-01-31 NOTE — Assessment & Plan Note (Signed)
 S/p EGD - gastritis. Continue PPI. Pepcid  added previously. No upper symptoms reported.

## 2024-01-31 NOTE — Assessment & Plan Note (Signed)
 On hydralazine and coreg. Pressure as outlined. No changes in medication. Follow pressures. Follow metabolic panel.

## 2024-01-31 NOTE — Assessment & Plan Note (Signed)
 Last hgb wnl. Recent B12 level improved.

## 2024-01-31 NOTE — Assessment & Plan Note (Signed)
 Appears to be in SR today. Continue coreg and eliquis. Stable.

## 2024-01-31 NOTE — Assessment & Plan Note (Signed)
No upper symptoms.  Continue nexium.  

## 2024-01-31 NOTE — Assessment & Plan Note (Signed)
 Continue zoloft . Notify me if feels needs further intervention. Follow.

## 2024-01-31 NOTE — Assessment & Plan Note (Signed)
Continue mevacor °

## 2024-01-31 NOTE — Assessment & Plan Note (Signed)
 Low-carb diet and exercise.  Follow met b and A1c.

## 2024-01-31 NOTE — Assessment & Plan Note (Signed)
Continue mevacor.  Low cholesterol diet and exercise.  Follow lipid panel and liver function tests.  

## 2024-01-31 NOTE — Assessment & Plan Note (Signed)
 Followed by urology. Had f/u with urology 12/01/23 - vaginal atrophy - estrogen cream. Recommended to continue sanctura  IR and recommended a renal ultrasound. Renal ultrasound 12/30/23 - no acute abnormality.

## 2024-02-17 ENCOUNTER — Ambulatory Visit: Admitting: Dermatology

## 2024-02-25 ENCOUNTER — Other Ambulatory Visit: Payer: Self-pay | Admitting: Internal Medicine

## 2024-02-29 ENCOUNTER — Ambulatory Visit: Payer: Self-pay

## 2024-02-29 NOTE — Telephone Encounter (Signed)
 Copied from CRM [id number]. Topic: Clinical - Red Word Triage >> Feb 29, 2024  9:26 AM Rachael Austin wrote: Red Word that prompted transfer to Nurse Triage: Patient says she is having a problem, thinks she may have a urinary tract infection. Urine is cloudy and hurts so bad. Stings and tingles. Started Friday  Chief Complaint: dysuria, cloudy urine Symptoms: see above Frequency: constant Pertinent Negatives: Patient denies fever, flank pain Disposition: [] ED /[] Urgent Care (no appt availability in office) / [x] Appointment(In office/virtual)/ []  [facility]/ [] Home Care/ [] Refused Recommended Disposition /[] [facility]/ []  Follow-up with PCP Additional Notes: apt made for tomorrow; care advice given, denies questions; instructed to go to ER if becomes worse.   Reason for Disposition  [1] SEVERE pain with urination (e.g., excruciating) AND [2] not improved after 2 hours of pain medicine and Sitz bath  Answer Assessment - Initial Assessment Questions 1. SEVERITY: "How bad is the pain?"  (e.g., Scale 1-10; mild, moderate, or severe)   - MILD (1-3): complains slightly about urination hurting   - MODERATE (4-7): interferes with normal activities     - SEVERE (8-10): excruciating, unwilling or unable to urinate because of the pain      severe 2. FREQUENCY: "How many times have you had painful urination today?"      Every time she urinates  3. PATTERN: "Is pain present every time you urinate or just sometimes?"      constant 4. ONSET: "When did the painful urination start?"      Friday 5. FEVER: "Do you have a fever?" If Yes, ask: "What is your temperature, how was it measured, and when did it start?"     no 6. PAST UTI: "Have you had a urine infection before?" If Yes, ask: "When was the last time?" and "What happened that time?"      yes 7. CAUSE: "What do you think is causing the painful urination?"  (e.g., UTI, scratch, Herpes sore)     uti 8. OTHER SYMPTOMS: "Do you have  any other symptoms?" (e.g., blood in urine, flank pain, genital sores, urgency, vaginal discharge)     Cloudy urine 9. PREGNANCY: "Is there any chance you are pregnant?" "When was your last menstrual period?"     na  Protocols used: Urination Pain - Female-A-AH

## 2024-03-01 ENCOUNTER — Ambulatory Visit

## 2024-03-01 ENCOUNTER — Encounter: Payer: Self-pay | Admitting: Family Medicine

## 2024-03-01 ENCOUNTER — Ambulatory Visit: Payer: Self-pay | Admitting: Family Medicine

## 2024-03-01 ENCOUNTER — Ambulatory Visit: Admitting: Family Medicine

## 2024-03-01 VITALS — BP 130/64 | HR 57 | Temp 98.0°F | Resp 20 | Ht 64.0 in | Wt 147.5 lb

## 2024-03-01 DIAGNOSIS — R300 Dysuria: Secondary | ICD-10-CM

## 2024-03-01 DIAGNOSIS — R399 Unspecified symptoms and signs involving the genitourinary system: Secondary | ICD-10-CM | POA: Diagnosis not present

## 2024-03-01 DIAGNOSIS — N3281 Overactive bladder: Secondary | ICD-10-CM

## 2024-03-01 DIAGNOSIS — N3010 Interstitial cystitis (chronic) without hematuria: Secondary | ICD-10-CM

## 2024-03-01 LAB — POC URINALSYSI DIPSTICK (AUTOMATED)
Bilirubin, UA: NEGATIVE
Glucose, UA: NEGATIVE
Ketones, UA: NEGATIVE
Nitrite, UA: NEGATIVE
Protein, UA: NEGATIVE
Spec Grav, UA: 1.01 (ref 1.010–1.025)
Urobilinogen, UA: 0.2 U/dL
pH, UA: 6 (ref 5.0–8.0)

## 2024-03-01 LAB — URINALYSIS, ROUTINE W REFLEX MICROSCOPIC
Bilirubin Urine: NEGATIVE
Hgb urine dipstick: NEGATIVE
Ketones, ur: NEGATIVE
Nitrite: NEGATIVE
Specific Gravity, Urine: 1.01 (ref 1.000–1.030)
Total Protein, Urine: NEGATIVE
Urine Glucose: NEGATIVE
Urobilinogen, UA: 0.2 (ref 0.0–1.0)
pH: 6 (ref 5.0–8.0)

## 2024-03-01 NOTE — Progress Notes (Unsigned)
 SUBJECTIVE:   Chief Complaint  Patient presents with   Urinary Tract Infection    X 4 days   HPI Presents for acute visit  Discussed the use of AI scribe software for clinical note transcription with the patient, who gave verbal consent to proceed.  History of Present Illness Rachael Austin is an 83 year old female who presents with symptoms suggestive of a urinary tract infection.  She experiences dysuria, describing the pain as deep and radiating upwards, which began on Friday afternoon. There is increased urinary frequency, including nocturia, necessitating the use of extra thick pads at night, which she changed three times due to leakage. No fever is present.  She uses estrogen cream approximately twice a week, applied vaginally but not on the urethra. Additionally, she uses nystatin  cream regularly, though it was not applied on the morning of the visit.  No hematuria, vaginal discharge, or current sexual activity. Occasional back pain is noted, which she associates with her kidneys. She maintains adequate hydration with water  but avoids cranberry juice due to burning sensations. Cranberry supplements were tried in the past but are not currently used.  Her husband passed away eight years ago.     PERTINENT PMH / PSH: As above  OBJECTIVE:  BP 130/64   Pulse (!) 57   Temp 98 F (36.7 C)   Resp 20   Ht 5\' 4"  (1.626 m)   Wt 147 lb 8 oz (66.9 kg)   LMP 11/20/1976   SpO2 99%   BMI 25.32 kg/m    Physical Exam Vitals reviewed.  Constitutional:      General: She is not in acute distress.    Appearance: Normal appearance. She is normal weight. She is not ill-appearing, toxic-appearing or diaphoretic.  Eyes:     General:        Right eye: No discharge.        Left eye: No discharge.     Conjunctiva/sclera: Conjunctivae normal.  Cardiovascular:     Rate and Rhythm: Normal rate.  Pulmonary:     Effort: Pulmonary effort is normal.  Abdominal:     General: Bowel  sounds are normal.     Tenderness: There is no abdominal tenderness.  Musculoskeletal:        General: Normal range of motion.  Skin:    General: Skin is warm and dry.  Neurological:     General: No focal deficit present.     Mental Status: She is alert and oriented to person, place, and time. Mental status is at baseline.  Psychiatric:        Mood and Affect: Mood normal.        Behavior: Behavior normal.        Thought Content: Thought content normal.        Judgment: Judgment normal.    {Perform Simple Foot Exam  Perform Detailed exam:1} {Insert foot Exam (Optional):30965}      09/18/2023   10:53 AM 08/19/2023   11:24 AM 07/21/2023   10:25 AM 06/26/2022    2:52 PM 06/19/2022   10:16 AM  Depression screen PHQ 2/9  Decreased Interest 1 1 1 3  0  Down, Depressed, Hopeless 1 1 1  0 0  PHQ - 2 Score 2 2 2 3  0  Altered sleeping 3 2 1  0   Tired, decreased energy 3 2 1  0   Change in appetite 3 1 2  0   Feeling bad or failure about yourself  0 1  1 0   Trouble concentrating 0 1 1 0   Moving slowly or fidgety/restless 0 0 0 0   Suicidal thoughts 0 0 0 0   PHQ-9 Score 11 9 8 3    Difficult doing work/chores Somewhat difficult Somewhat difficult Somewhat difficult Not difficult at all       09/18/2023   10:54 AM 08/19/2023   11:25 AM  GAD 7 : Generalized Anxiety Score  Nervous, Anxious, on Edge 2 1  Control/stop worrying 2 1  Worry too much - different things 2   Trouble relaxing 2 1  Restless 1 0  Easily annoyed or irritable 1 0  Afraid - awful might happen 1 0  Total GAD 7 Score 11   Anxiety Difficulty Somewhat difficult Somewhat difficult    ASSESSMENT/PLAN:  Dysuria -     POCT Urinalysis Dipstick (Automated) -     Urine Culture -     Urinalysis, Routine w reflex microscopic  Chronic interstitial cystitis  Detrusor dyssynergia    Assessment and Plan Assessment & Plan Urinary Tract Infection (UTI) Suspected UTI with dysuria, increased frequency, and nocturia.  No fever, hematuria, or discharge. Back pain possibly renal. Hydrates well, avoids cranberry juice. - Await urinalysis results. - Discuss treatment options post-urinalysis. - Consider advising on urethral application of estrogen cream if appropriate.  General Health Maintenance Estrogen cream used less than recommended. No recent urological procedures. Not sexually active. - Educated on applying estrogen cream at least every other day.       PDMP reviewed***  Return if symptoms worsen or fail to improve, for PCP.  Valli Gaw, MD

## 2024-03-01 NOTE — Patient Instructions (Addendum)
 It was a pleasure meeting you today. Thank you for allowing me to take part in your health care.  Our goals for today as we discussed include:  Urine did not show any infection Will send for further evaluation  Can use over the counter AZO to help with symptoms Can take probiotics daily Can take cranberry supplements daily  Follow up with Urology as scheduled.  If no improvement recommend calling to schedule earlier appointment.  Recommend pelvic floor therapy.  Can send referral to physical therapy.  This is to help with bladder retraining.  Make sure you: Pee often and fully. Do not hold your pee for a long time. Wipe from front to back after you pee or poop. Use each tissue only once when you wipe. Pee after you have sex. Do not douche or use sprays or powders in your genital area.    This is a list of the screening recommended for you and due dates:  Health Maintenance  Topic Date Due   Zoster (Shingles) Vaccine (1 of 2) [birth year]/1959   Medicare Annual Wellness Visit  09/20/2015   COVID-19 Vaccine (4 - 2024-25 season) 05/31/2023   Mammogram  05/11/2024*   Pneumonia Vaccine (1 of 2 - PCV) 11/19/2024*   Flu Shot  04/29/2024   DEXA scan (bone density measurement)  Completed   HPV Vaccine  Aged Out   Meningitis B Vaccine  Aged Out   DTaP/Tdap/Td vaccine  Discontinued  *Topic was postponed. The date shown is not the original due date.    If you have any questions or concerns, please do not hesitate to call the office at [phone].  I look forward to our next visit and until then take care and stay safe.  Regards,   Valli Gaw, MD   [facility]

## 2024-03-03 ENCOUNTER — Encounter: Payer: Self-pay | Admitting: Family Medicine

## 2024-03-03 LAB — URINE CULTURE
MICRO NUMBER:: 16532575
SPECIMEN QUALITY:: ADEQUATE

## 2024-03-03 MED ORDER — CEFDINIR 300 MG PO CAPS: 300.0000 mg | ORAL_CAPSULE | Freq: Two times a day (BID) | ORAL | 0 refills | Status: AC

## 2024-03-03 NOTE — Assessment & Plan Note (Signed)
 Suspected UTI with dysuria, increased frequency, and nocturia. No fever, hematuria, or discharge. Back pain possibly renal. Hydrates well, avoids cranberry juice. - POC urine postive nitrites, negative leukocytes - Await urine cultures, given no fevers   Addendum Urine culture positive for E.Coli.  Sensitivities pending Start Cefdinir 300 mg BID Start Probiotics daily Follow up with PCP if no improvement

## 2024-03-09 ENCOUNTER — Ambulatory Visit: Admitting: Dermatology

## 2024-03-09 DIAGNOSIS — C4441 Basal cell carcinoma of skin of scalp and neck: Secondary | ICD-10-CM | POA: Diagnosis not present

## 2024-03-09 DIAGNOSIS — W908XXA Exposure to other nonionizing radiation, initial encounter: Secondary | ICD-10-CM

## 2024-03-09 DIAGNOSIS — D492 Neoplasm of unspecified behavior of bone, soft tissue, and skin: Secondary | ICD-10-CM

## 2024-03-09 DIAGNOSIS — D485 Neoplasm of uncertain behavior of skin: Secondary | ICD-10-CM

## 2024-03-09 DIAGNOSIS — L821 Other seborrheic keratosis: Secondary | ICD-10-CM | POA: Diagnosis not present

## 2024-03-09 DIAGNOSIS — L578 Other skin changes due to chronic exposure to nonionizing radiation: Secondary | ICD-10-CM

## 2024-03-09 DIAGNOSIS — L910 Hypertrophic scar: Secondary | ICD-10-CM

## 2024-03-09 DIAGNOSIS — Z872 Personal history of diseases of the skin and subcutaneous tissue: Secondary | ICD-10-CM | POA: Diagnosis not present

## 2024-03-09 NOTE — Progress Notes (Signed)
 Follow-Up Visit   Subjective  Rachael Austin is a 83 y.o. female who presents for the following: 6 wk AK f/u and biopsy to posterior neck.  The patient has spots, moles and lesions to be evaluated, some may be new or changing.    The following portions of the chart were reviewed this encounter and updated as appropriate: medications, allergies, medical history  Review of Systems:  No other skin or systemic complaints except as noted in HPI or Assessment and Plan.  Objective  Well appearing patient in no apparent distress; mood and affect are within normal limits.  A focused examination was performed of the following areas: Face, neck, hands, legs Relevant physical exam findings are noted in the Assessment and Plan.  Posterior Neck 1.0 cm pink pearly macule   Assessment & Plan  ACTINIC DAMAGE - chronic, secondary to cumulative UV radiation exposure/sun exposure over time - diffuse scaly erythematous macules with underlying dyspigmentation - Recommend daily broad spectrum sunscreen SPF 30+ to sun-exposed areas, reapply every 2 hours as needed.  - Recommend staying in the shade or wearing long sleeves, sun glasses (UVA+UVB protection) and wide brim hats (4-inch brim around the entire circumference of the hat). - Call for new or changing lesions.  HISTORY OF PRECANCEROUS ACTINIC KERATOSIS - site(s) of PreCancerous Actinic Keratosis clear today. - these may recur and new lesions may form requiring treatment to prevent transformation into skin cancer - observe for new or changing spots and contact [facility] for appointment if occur - photoprotection with sun protective clothing; sunglasses and broad spectrum sunscreen with SPF of at least 30 + and frequent self skin exams recommended - yearly exams by a dermatologist recommended for persons with history of PreCancerous Actinic Keratoses  SEBORRHEIC KERATOSIS - Stuck-on, waxy, tan-brown papules and/or plaques,  including right calf  - Benign-appearing - Discussed benign etiology and prognosis. - Observe - Call for any changes  HYPERTROPHIC SCARS Exam: indurated plaque, left mid back at bra line, R spinal mid back.    Improved with ILK injections. Not bothersome to patient. Benign-appearing.  Call clinic for new or changing lesions.   NEOPLASM OF UNCERTAIN BEHAVIOR OF SKIN Posterior Neck Epidermal / dermal shaving  Lesion diameter (cm):  1 Informed consent: discussed and consent obtained   Patient was prepped and draped in usual sterile fashion: Area prepped with alcohol. Anesthesia: the lesion was anesthetized in a standard fashion   Anesthetic:  1% lidocaine  w/ epinephrine 1-100,000 buffered w/ 8.4% NaHCO3 Instrument used: flexible razor blade   Hemostasis achieved with: pressure, aluminum chloride and electrodesiccation   Outcome: patient tolerated procedure well    Destruction of lesion  Destruction method: electrodesiccation and curettage   Informed consent: discussed and consent obtained   Curettage performed in three different directions: Yes   Electrodesiccation performed over the curetted area: Yes   Final wound size (cm):  1.2 Hemostasis achieved with:  pressure, aluminum chloride and electrodesiccation Outcome: patient tolerated procedure well with no complications   Post-procedure details: wound care instructions given   Post-procedure details comment:  Ointment and bandage applied. Specimen 1 - Surgical pathology Differential Diagnosis: Scar r/o BCC Check Margins: No EDC today.   Return in about 6 months (around 09/08/2024) for TBSE, Hx SCC, Hx BCC.  IBernardine Bridegroom, CMA, am acting as scribe for Artemio Larry, MD .   Documentation: I have reviewed the above documentation for accuracy and completeness, and I agree with the above.  Artemio Larry,  MD

## 2024-03-09 NOTE — Patient Instructions (Addendum)
 Wound Care Instructions  Cleanse wound gently with soap and water once a day then pat dry with clean gauze. Apply a thin coat of Petrolatum (petroleum jelly, "Vaseline") over the wound (unless you have an allergy to this). We recommend that you use a new, sterile tube of Vaseline. Do not pick or remove scabs. Do not remove the yellow or white "healing tissue" from the base of the wound.  Cover the wound with fresh, clean, nonstick gauze and secure with paper tape. You may use Band-Aids in place of gauze and tape if the wound is small enough, but would recommend trimming much of the tape off as there is often too much. Sometimes Band-Aids can irritate the skin.  You should call the office for your biopsy report after 1 week if you have not already been contacted.  If you experience any problems, such as abnormal amounts of bleeding, swelling, significant bruising, significant pain, or evidence of infection, please call the office immediately.  FOR ADULT SURGERY PATIENTS: If you need something for pain relief you may take 1 extra strength Tylenol  (acetaminophen ) AND 2 Ibuprofen (200mg  each) together every 4 hours as needed for pain. (do not take these if you are allergic to them or if you have a reason you should not take them.) Typically, you may only need pain medication for 1 to 3 days.         Seborrheic Keratosis  What causes seborrheic keratoses? Seborrheic keratoses are harmless, common skin growths that first appear during adult life.  As time goes by, more growths appear.  Some people may develop a large number of them.  Seborrheic keratoses appear on both covered and uncovered body parts.  They are not caused by sunlight.  The tendency to develop seborrheic keratoses can be inherited.  They vary in color from skin-colored to gray, brown, or even black.  They can be either smooth or have a rough, warty surface.   Seborrheic keratoses are superficial and look as if they were stuck on the  skin.  Under the microscope this type of keratosis looks like layers upon layers of skin.  That is why at times the top layer may seem to fall off, but the rest of the growth remains and re-grows.    Treatment Seborrheic keratoses do not need to be treated, but can easily be removed in the office.  Seborrheic keratoses often cause symptoms when they rub on clothing or jewelry.  Lesions can be in the way of shaving.  If they become inflamed, they can cause itching, soreness, or burning.  Removal of a seborrheic keratosis can be accomplished by freezing, burning, or surgery. If any spot bleeds, scabs, or grows rapidly, please return to have it checked, as these can be an indication of a skin cancer.      Due to recent changes in healthcare laws, you may see results of your pathology and/or laboratory studies on MyChart before the doctors have had a chance to review them. We understand that in some cases there may be results that are confusing or concerning to you. Please understand that not all results are received at the same time and often the doctors may need to interpret multiple results in order to provide you with the best plan of care or course of treatment. Therefore, we ask that you please give us  2 business days to thoroughly review all your results before contacting the office for clarification. Should we see a critical lab result, you will  be contacted sooner.   If You Need Anything After Your Visit  If you have any questions or concerns for your doctor, please call our main line at [phone] and press option 4 to reach your doctor's medical assistant. If no one answers, please leave a voicemail as directed and we will return your call as soon as possible. Messages left after 4 pm will be answered the following business day.   You may also send us  a message via MyChart. We typically respond to MyChart messages within 1-2 business days.  For prescription refills, please ask your  pharmacy to contact our office. Our fax number is [phone].  If you have an urgent issue when the clinic is closed that cannot wait until the next business day, you can page your doctor at the number below.    Please note that while we do our best to be available for urgent issues outside of office hours, we are not available 24/7.   If you have an urgent issue and are unable to reach us , you may choose to seek medical care at your doctor's office, retail clinic, urgent care center, or emergency room.  If you have a medical emergency, please immediately call 911 or go to the emergency department.  Pager Numbers  - Dr. Bary Likes: [phone]  - Dr. Annette Barters: [phone]  - Dr. Felipe Horton: [phone]   In the event of inclement weather, please call our main line at [phone] for an update on the status of any delays or closures.  Dermatology Medication Tips: Please keep the boxes that topical medications come in in order to help keep track of the instructions about where and how to use these. Pharmacies typically print the medication instructions only on the boxes and not directly on the medication tubes.   If your medication is too expensive, please contact our office at [phone] option 4 or send us  a message through MyChart.   We are unable to tell what your co-pay for medications will be in advance as this is different depending on your insurance coverage. However, we may be able to find a substitute medication at lower cost or fill out paperwork to get insurance to cover a needed medication.   If a prior authorization is required to get your medication covered by your insurance company, please allow us  1-2 business days to complete this process.  Drug prices often vary depending on where the prescription is filled and some pharmacies may offer cheaper prices.  The website www.goodrx.com contains coupons for medications through different pharmacies. The prices here do not  account for what the cost may be with help from insurance (it may be cheaper with your insurance), but the website can give you the price if you did not use any insurance.  - You can print the associated coupon and take it with your prescription to the pharmacy.  - You may also stop by our office during regular business hours and pick up a GoodRx coupon card.  - If you need your prescription sent electronically to a different pharmacy, notify our office through [facility] or by phone at [phone] option 4.     Si Usted Necesita Algo Despus de Su Visita  Tambin puede enviarnos un mensaje a travs de Clinical cytogeneticist. Por lo general respondemos a los mensajes de MyChart en el transcurso de 1 a 2 das hbiles.  Para renovar recetas, por favor pida a su farmacia que se ponga en contacto con nuestra oficina. Nuestro nmero de  fax es el [phone].  Si tiene un asunto urgente cuando la clnica est cerrada y que no puede esperar hasta el siguiente da hbil, puede llamar/localizar a su doctor(a) al nmero que aparece a continuacin.   Por favor, tenga en cuenta que aunque hacemos todo lo posible para estar disponibles para asuntos urgentes fuera del horario de [place], no estamos disponibles las 24 horas del da, los 7 [address]  [address] de la [place].   Si tiene un problema urgente y no puede comunicarse con nosotros, puede optar por buscar atencin mdica  en el consultorio de su doctor(a), en una clnica privada, en un centro de atencin urgente o en una sala de emergencias.  Si tiene Engineer, drilling, por favor llame inmediatamente al 911 o vaya a la sala de emergencias.  Nmeros de bper  - Dr. Bary Likes: [phone]  - Dra. Annette Barters: [phone]  - Dr. Felipe Horton: [phone]   En caso de inclemencias del tiempo, por favor llame a L[place]uan [place] principal al [phone] para una actualizacin sobre el [place] de cualquier retraso o cierre.  Consejos para la medicacin en dermatologa: Por  favor, guarde las cajas en las que vienen los medicamentos de uso tpico para ayudarle a seguir las instrucciones sobre dnde y cmo usarlos. Las farmacias generalmente imprimen las instrucciones del medicamento slo en las cajas y no directamente en los tubos del [place].   Si su medicamento es muy caro, por favor, pngase en contacto con Bettyjane Brunet llamando al [phone] y presione la opcin 4 o envenos un mensaje a travs de Clinical cytogeneticist.   No podemos decirle cul ser su copago por los medicamentos por adelantado ya que esto es diferente dependiendo de la cobertura de su seguro. Sin embargo, es posible que podamos encontrar un medicamento sustituto a Audiological scientist un formulario para que el seguro cubra el medicamento que se considera necesario.   Si se requiere una autorizacin previa para que su compaa de seguros [place] su medicamento, por favor permtanos de 1 a 2 das hbiles para completar este proceso.  Los precios de los medicamentos varan con frecuencia dependiendo del Environmental consultant de dnde se surte la receta y alguna farmacias pueden ofrecer precios ms baratos.  El sitio web www.goodrx.com tiene cupones para medicamentos de Health and safety inspector. Los precios aqu no tienen en cuenta lo que podra costar con la ayuda del seguro (puede ser ms barato con su seguro), pero el sitio web puede darle el precio si no utiliz Tourist information centre manager.  - Puede imprimir el cupn correspondiente y llevarlo con su receta a la farmacia.  - Tambin puede pasar por nuestra oficina durante el horario de atencin regular y Education officer, museum una tarjeta de cupones de GoodRx.  - Si necesita que su receta se enve electrnicamente a una farmacia diferente, informe a nuestra oficina a travs de MyChart de [place] o por telfono llamando al [phone] y presione la opcin 4.

## 2024-03-10 LAB — SURGICAL PATHOLOGY

## 2024-03-14 ENCOUNTER — Encounter: Payer: Self-pay | Admitting: Dermatology

## 2024-03-14 ENCOUNTER — Ambulatory Visit: Payer: Self-pay | Admitting: Dermatology

## 2024-03-14 NOTE — Telephone Encounter (Signed)
-----   Message from Artemio Larry sent at 03/14/2024 11:57 AM EDT ----- 1. Skin, posterior neck :       BASAL CELL CARCINOMA, NODULAR PATTERN, PERIPHERAL AND DEEP MARGINS INVOLVED  BCC skin cancer- already treated with EDC at time of biopsy   - please call patient ----- Message ----- From: Interface, Lab In Three Zero One Sent: 03/10/2024   5:43 PM EDT To: Artemio Larry, MD

## 2024-03-14 NOTE — Telephone Encounter (Signed)
 Advised patient of bx results./sh

## 2024-05-04 ENCOUNTER — Other Ambulatory Visit (INDEPENDENT_AMBULATORY_CARE_PROVIDER_SITE_OTHER)

## 2024-05-04 DIAGNOSIS — R944 Abnormal results of kidney function studies: Secondary | ICD-10-CM

## 2024-05-04 DIAGNOSIS — E7800 Pure hypercholesterolemia, unspecified: Secondary | ICD-10-CM

## 2024-05-04 DIAGNOSIS — R739 Hyperglycemia, unspecified: Secondary | ICD-10-CM

## 2024-05-04 DIAGNOSIS — D649 Anemia, unspecified: Secondary | ICD-10-CM | POA: Diagnosis not present

## 2024-05-04 LAB — BASIC METABOLIC PANEL WITH GFR
BUN: 11 mg/dL (ref 6–23)
CO2: 30 meq/L (ref 19–32)
Calcium: 9.2 mg/dL (ref 8.4–10.5)
Chloride: 99 meq/L (ref 96–112)
Creatinine, Ser: 1.04 mg/dL (ref 0.40–1.20)
GFR: 49.42 mL/min — ABNORMAL LOW (ref >60.00)
Glucose, Bld: 100 mg/dL — ABNORMAL HIGH (ref 70–99)
Potassium: 4.8 meq/L (ref 3.5–5.1)
Sodium: 133 meq/L — ABNORMAL LOW (ref 135–145)

## 2024-05-04 LAB — LIPID PANEL
Cholesterol: 173 mg/dL (ref 0–200)
HDL: 71.3 mg/dL (ref >39.00)
LDL Cholesterol: 90 mg/dL (ref 0–99)
NonHDL: 101.88
Total CHOL/HDL Ratio: 2
Triglycerides: 60 mg/dL (ref 0.0–149.0)
VLDL: 12 mg/dL (ref 0.0–40.0)

## 2024-05-04 LAB — HEPATIC FUNCTION PANEL
ALT: 8 U/L (ref 0–35)
AST: 12 U/L (ref 0–37)
Albumin: 4.3 g/dL (ref 3.5–5.2)
Alkaline Phosphatase: 58 U/L (ref 39–117)
Bilirubin, Direct: 0.2 mg/dL (ref 0.0–0.3)
Total Bilirubin: 0.8 mg/dL (ref 0.2–1.2)
Total Protein: 6.4 g/dL (ref 6.0–8.3)

## 2024-05-04 LAB — HEMOGLOBIN A1C: Hgb A1c MFr Bld: 6 % (ref 4.6–6.5)

## 2024-05-04 NOTE — Addendum Note (Signed)
 Addended by: TANDA HARVEY D on: 05/04/2024 10:20 AM   Modules accepted: Orders

## 2024-05-05 ENCOUNTER — Ambulatory Visit: Payer: Self-pay | Admitting: Internal Medicine

## 2024-05-11 ENCOUNTER — Ambulatory Visit: Admitting: Internal Medicine

## 2024-05-11 ENCOUNTER — Ambulatory Visit (INDEPENDENT_AMBULATORY_CARE_PROVIDER_SITE_OTHER)

## 2024-05-11 ENCOUNTER — Encounter: Payer: Self-pay | Admitting: Internal Medicine

## 2024-05-11 VITALS — BP 120/62 | HR 72 | Temp 98.0°F | Ht 64.0 in | Wt 148.0 lb

## 2024-05-11 DIAGNOSIS — R053 Chronic cough: Secondary | ICD-10-CM | POA: Diagnosis not present

## 2024-05-11 DIAGNOSIS — I480 Paroxysmal atrial fibrillation: Secondary | ICD-10-CM

## 2024-05-11 DIAGNOSIS — F439 Reaction to severe stress, unspecified: Secondary | ICD-10-CM

## 2024-05-11 DIAGNOSIS — R059 Cough, unspecified: Secondary | ICD-10-CM

## 2024-05-11 DIAGNOSIS — R944 Abnormal results of kidney function studies: Secondary | ICD-10-CM

## 2024-05-11 DIAGNOSIS — E871 Hypo-osmolality and hyponatremia: Secondary | ICD-10-CM | POA: Diagnosis not present

## 2024-05-11 DIAGNOSIS — E2839 Other primary ovarian failure: Secondary | ICD-10-CM | POA: Diagnosis not present

## 2024-05-11 DIAGNOSIS — H9190 Unspecified hearing loss, unspecified ear: Secondary | ICD-10-CM

## 2024-05-11 DIAGNOSIS — I700 Atherosclerosis of aorta: Secondary | ICD-10-CM

## 2024-05-11 DIAGNOSIS — Z0000 Encounter for general adult medical examination without abnormal findings: Secondary | ICD-10-CM

## 2024-05-11 DIAGNOSIS — Z1231 Encounter for screening mammogram for malignant neoplasm of breast: Secondary | ICD-10-CM

## 2024-05-11 DIAGNOSIS — E7800 Pure hypercholesterolemia, unspecified: Secondary | ICD-10-CM

## 2024-05-11 DIAGNOSIS — D649 Anemia, unspecified: Secondary | ICD-10-CM

## 2024-05-11 DIAGNOSIS — I771 Stricture of artery: Secondary | ICD-10-CM | POA: Diagnosis not present

## 2024-05-11 DIAGNOSIS — I10 Essential (primary) hypertension: Secondary | ICD-10-CM

## 2024-05-11 DIAGNOSIS — R739 Hyperglycemia, unspecified: Secondary | ICD-10-CM | POA: Diagnosis not present

## 2024-05-11 DIAGNOSIS — K219 Gastro-esophageal reflux disease without esophagitis: Secondary | ICD-10-CM

## 2024-05-11 MED ORDER — ESOMEPRAZOLE MAGNESIUM 40 MG PO CPDR: 40.0000 mg | DELAYED_RELEASE_CAPSULE | Freq: Every day | ORAL | 3 refills | Status: AC

## 2024-05-11 MED ORDER — LOVASTATIN 20 MG PO TABS: 20.0000 mg | ORAL_TABLET | Freq: Every day | ORAL | 1 refills | Status: DC

## 2024-05-11 MED ORDER — SERTRALINE HCL 50 MG PO TABS: 50.0000 mg | ORAL_TABLET | Freq: Every day | ORAL | 1 refills | Status: DC

## 2024-05-11 NOTE — Progress Notes (Unsigned)
 Subjective:    [name], female    DOB: October 28, 1940, 83 y.o.   MRN: [id number]  Patient here for  Chief Complaint  Patient presents with  . Annual Exam    Would like to discuss b/p medications    HPI Here for a physical exam. Had f/u with urology 12/01/23 - vaginal atrophy - estrogen cream. Recommended to continue sanctura  IR and recommended a renal ultrasound. Renal ultrasound 12/30/23 - no acute abnormality. Continues on hydralazine  and coreg . Continues on zoloft .    Past Medical History:  Diagnosis Date  . AK (actinic keratosis) 11/12/2022   left upper arm, tx'd with EDC  . AK (actinic keratosis) 11/12/2022   right medial pretibia, LN2 11/25/22  . Basal cell carcinoma 06/21/2020   left post shoulder sup, left mid chest, left lat thigh  . Basal cell carcinoma 11/07/2021   L upper back, EDC  . Basal cell carcinoma 11/07/2021   R upper back, EDC  . Basal cell carcinoma 11/27/2021   right upper arm, EDC  . Basal cell carcinoma 11/27/2021   right shoulder posterior, EDC  . Basal cell carcinoma 11/27/2021   right anterior shoulder, EDC  . Basal cell carcinoma 03/09/2024   posterior neck, EDC  . Basal cell carcinoma (BCC) 06/13/2021   left dorsal foot, EDC 10/02/2021  . Basal cell carcinoma (BCC) 06/13/2021   right postauricular tx'd w/ EDC  . BCC (basal cell carcinoma of skin) 03/30/2007   R inf med pretibial - BCC  . BCC (basal cell carcinoma of skin) 10/12/2013   L nasal ala - BCC  . BCC (basal cell carcinoma of skin) 03/10/2018   L mid dorsum med forearm - superficial BCC   . BCC (basal cell carcinoma) 10/02/2021   left dorsal foot proximal, EDC 10/02/2021  . BCC (basal cell carcinoma) 10/02/2021   left mid back, superficial EDC 11/07/21  . BCC (basal cell carcinoma) 10/02/2021   right pretibia  . BCC (basal cell carcinoma) 10/02/2021   right mid back, superficial EDC 11/07/21  . BCC (basal cell carcinoma) 08/14/2022   left distal calf, tx'd with EDC   . BCC (basal cell carcinoma) 11/12/2022   superficial at left lateral pretibial,l tx'd with EDC  . BCC (basal cell carcinoma) 11/12/2022   superficial at mid back right of midline, scheduled for EDC  . BCC (basal cell carcinoma) 11/12/2022   mid back right of midline, [facility] 11/25/22  . Breast screening, unspecified 2013  . Cancer (HCC) 1992   skin  . Coronary artery calcification seen on CT scan   . Degenerative disk disease   . Diastolic dysfunction    a. 10/2021 Echo: EF 60-65%, no rwma, GrI DD, mild MR; b. 01/2023 Echo: EF 65-70%, no rwma, mild LVH, GrI DD Nl RV fxn. Triv MR. Mild-mod TR. AoV sclerosis.  SABRA GERD (gastroesophageal reflux disease)   . History of basal cell carcinoma (BCC) 08/29/2020    left inferior knee lateral, , left inferior knee medial, right pretibia  . History of SCC (squamous cell carcinoma) of skin 08/29/2020   right posterior calf, left lateral calf, and   . Hypercholesterolemia 2008  . Interstitial cystitis    followed by Dr Ike  . Near syncope   . Other sign and symptom in breast 2013   Left upper outer quadrant breast soreness Ultrasound exam of right breast in the 2 o'clock position with the breast distracted medially showed a 0.3-0.4 with 0.5 cm simple cyst.  In the 1 o'clock position where pt reported tenderness US  exam was negatiive.  Because of her history of intermittent nipple drainage, ultrasound was completed of the retroareolar area.  SABRA PAF (paroxysmal atrial fibrillation) (HCC)    a. dx 01/2023-->Eliquis  5 BID (CHA2DS2VASc = 5).  . Personal history of tobacco use, presenting hazards to health   . PONV (postoperative nausea and vomiting)   . Primary hypertension   . SCC (squamous cell carcinoma) 03/28/2008   R dorsum hand - SCC  . SCC (squamous cell carcinoma) 05/09/2009   R lat lower leg - SCCIS  . SCC (squamous cell carcinoma) 05/09/2009   L lat lower leg - SCCIS  . SCC (squamous cell carcinoma) 04/07/2013   L lower leg - SCCIS  . SCC  (squamous cell carcinoma) 04/27/2013   R forearm - SCC  . SCC (squamous cell carcinoma) 04/28/2017   L lat knee - SCCIS  . SCC (squamous cell carcinoma) 05/12/2018   L prox dorsum forearm - SCC  . SCC (squamous cell carcinoma) 06/13/2021   left forearm tx'd w/ EDC  . SCC (squamous cell carcinoma), leg, right 03/30/2007   R sup pretibial - SCCIS  . SCC (squamous cell carcinoma);BCC 10/12/2013   Mid back - superficial BCC with SCCIS   . Special screening for malignant neoplasms, colon   . 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   left dorsal forearm, left lat calf  . 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   left lower leg superior, EDC at follow up  . 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   chest right of midline, tx'd with [place]   Past Surgical History:  Procedure Laterality Date  . ABDOMINAL HYSTERECTOMY  1992  . APPENDECTOMY    . CERVICAL DISCECTOMY     S/P C7-T1 discectomy with fusion  . CHOLECYSTECTOMY  1990  . COLONOSCOPY WITH PROPOFOL  N/A 02/14/2016   Procedure: COLONOSCOPY WITH PROPOFOL ;  Surgeon: Rogelia Copping, MD;  Location[facility];  Service: Endoscopy;  Laterality: N/A;  PT WOULD LIKE 10 ARRIVAL TIME OR LATER  . DILATION AND CURETTAGE OF UTERUS    . ESOPHAGOGASTRODUODENOSCOPY (EGD) WITH PROPOFOL  N/A 02/14/2016   Procedure: ESOPHAGOGASTRODUODENOSCOPY (EGD) WITH PROPOFOL  with dialtion;  Surgeon: Rogelia Copping, MD;  Location: [facility];  Service: Endoscopy;  Laterality: N/A;  . ESOPHAGOGASTRODUODENOSCOPY (EGD) WITH PROPOFOL  N/A 04/13/2019   Procedure: ESOPHAGOGASTRODUODENOSCOPY (EGD) WITH PROPOFOL ;  Surgeon: Toledo, Ladell POUR, MD;  Location: [facility] ENDOSCOPY;  Service: Gastroenterology;  Laterality: N/A;  . HIP PINNING,CANNULATED Left 02/02/2023   Procedure: PERCUTANEOUS FIXATION OF FEMORAL NECK;  Surgeon: Marchia Drivers, MD;  Location: [facility] ORS;  Service: Orthopedics;  Laterality: Left;  SABRA MELANOMA EXCISION     removed from Left calf  1994   Family History  Problem Relation Age of Onset  . Hodgkin's lymphoma Mother   . Heart failure Father   . Heart attack Father   . Arthritis Sister        Three sisters w/ degeneratve disk disease  . Headache Sister        Two sisters hx of headache  . Breast cancer Neg Hx   . Colon cancer Neg Hx   . Bladder Cancer Neg Hx   . Kidney cancer Neg Hx    Social History   Socioeconomic History  . Marital status: Widowed    Spouse name: Not on file  . Number of children: 2  . Years of education: Not on file  .  Highest education level: Not on file  Occupational History  . Not on file  Tobacco Use  . Smoking status: Former    Current packs/day: 1.00    Average packs/day: 1 pack/day for 15.0 years (15.0 ttl pk-yrs)    Types: Cigarettes  . Smokeless tobacco: Never  Vaping Use  . Vaping status: Never Used  Substance and Sexual Activity  . Alcohol use: No    Alcohol/week: 0.0 standard drinks of alcohol  . Drug use: No  . Sexual activity: Not on file  Other Topics Concern  . Not on file  Social History Narrative   Married and has 2 children, daughters.   Social Drivers of Corporate investment banker Strain: Not on file  Food Insecurity: No Food Insecurity (02/11/2023)   Hunger Vital Sign   . Worried About Programme researcher, broadcasting/film/video in the Last Year: Never true   . Ran Out of Food in the Last Year: Never true  Transportation Needs: No Transportation Needs (02/11/2023)   PRAPARE - Transportation   . Lack of Transportation (Medical): No   . Lack of Transportation (Non-Medical): No  Physical Activity: Not on file  Stress: Not on file  Social Connections: Not on file     Review of Systems     Objective:     BP 120/62 (BP Location: Left Arm, Patient Position: Sitting, Cuff Size: Normal)   Pulse 72   Temp 98 F (36.7 C) (Oral)   Ht 5' 4 (1.626 m)   Wt 148 lb (67.1 kg)   LMP 11/20/1976   SpO2 95%   BMI 25.40 kg/m  Wt Readings from Last 3 Encounters:  05/11/24 148  lb (67.1 kg)  03/01/24 147 lb 8 oz (66.9 kg)  01/28/24 146 lb 3.2 oz (66.3 kg)    Physical Exam  {Perform Simple Foot Exam  Perform Detailed exam:1} {Insert foot Exam (Optional):30965}   Outpatient Encounter Medications as of 05/11/2024  Medication Sig  . acetaminophen  (TYLENOL ) 325 MG tablet Take 650 mg by mouth as needed.  . apixaban  (ELIQUIS ) 5 MG TABS tablet Take 1 tablet by mouth twice daily  . carvedilol  (COREG ) 25 MG tablet TAKE 1 TABLET BY MOUTH TWICE DAILY WITH A MEAL  . conjugated estrogens  (PREMARIN ) vaginal cream Place 1 Applicatorful vaginally daily. Apply 0.5mg  (pea-sized amount)  just inside the vaginal introitus with a finger-tip on  Monday, Wednesday and Friday nights.  . esomeprazole  (NEXIUM ) 40 MG capsule Take 1 capsule by mouth once daily  . hydrALAZINE  (APRESOLINE ) 25 MG tablet Take 1 tablet (25 mg total) by mouth 2 (two) times daily.  . imipramine  (TOFRANIL ) 10 MG tablet Take 10 mg by mouth at bedtime.  SABRA ipratropium (ATROVENT ) 0.03 % nasal spray Place 2 sprays into both nostrils every 12 (twelve) hours.  . lovastatin  (MEVACOR ) 20 MG tablet Take 1 tablet (20 mg total) by mouth at bedtime.  . nystatin  cream (MYCOSTATIN ) Apply 1 Application topically 2 (two) times daily.  . sertraline  (ZOLOFT ) 50 MG tablet Take 1 tablet (50 mg total) by mouth daily.  . trospium  (SANCTURA ) 20 MG tablet Take 1 tablet (20 mg total) by mouth daily.   No facility-administered encounter medications on file as of 05/11/2024.     Lab Results  Component Value Date   WBC 6.6 07/21/2023   HGB 12.0 07/21/2023   HCT 36.5 07/21/2023   PLT 208.0 07/21/2023   GLUCOSE 100 (H) 05/04/2024   CHOL 173 05/04/2024   TRIG 60.0 05/04/2024  HDL 71.30 05/04/2024   LDLCALC 90 05/04/2024   ALT 8 05/04/2024   AST 12 05/04/2024   NA 133 (L) 05/04/2024   K 4.8 05/04/2024   CL 99 05/04/2024   CREATININE 1.04 05/04/2024   BUN 11 05/04/2024   CO2 30 05/04/2024   TSH 2.97 11/12/2023   HGBA1C 6.0  05/04/2024    US  Renal Result Date: 12/30/2023 CLINICAL DATA:  Decreased GFR EXAM: RENAL / URINARY TRACT ULTRASOUND COMPLETE COMPARISON:  Limited abdomen ultrasound December 09, 2010 FINDINGS: Right Kidney: Renal measurements: 11.2 x 3.7 x 4.7 cm = volume: 101 mL. Echogenicity within normal limits. No mass or hydronephrosis visualized. Left Kidney: Renal measurements: 7.6 x 3.7 x 3.9 cm = volume: 57 mL. Echogenicity within normal limits. No mass or hydronephrosis visualized. Bladder: Appears normal for degree of bladder distention. Other: None. IMPRESSION: No acute abnormality identified. Electronically Signed   By: Craig Farr M.D.   On: 12/30/2023 09:08       Assessment & Plan:  Health care maintenance Assessment & Plan: Physical today 05/11/24.  S/p hysterectomy.  Mammogram 04/30/22- Birads I.  Overdue. Colonoscopy 01/2016 - diverticulosis and non bleeding internal hemorrhoids.     Hyperglycemia  Anemia, unspecified type  Hypercholesterolemia  Encounter for screening mammogram for malignant neoplasm of breast  Estrogen deficiency     Allena Hamilton, MD

## 2024-05-11 NOTE — Assessment & Plan Note (Signed)
 Physical today 05/11/24.  S/p hysterectomy.  Mammogram 04/30/22- Birads I.  Overdue. Colonoscopy 01/2016 - diverticulosis and non bleeding internal hemorrhoids.

## 2024-05-12 ENCOUNTER — Ambulatory Visit: Payer: Self-pay | Admitting: Internal Medicine

## 2024-05-12 DIAGNOSIS — R053 Chronic cough: Secondary | ICD-10-CM

## 2024-05-17 ENCOUNTER — Encounter: Payer: Self-pay | Admitting: Internal Medicine

## 2024-05-17 NOTE — Assessment & Plan Note (Signed)
 Has noticed decreased hearing. Request referral for further evaluation.

## 2024-05-17 NOTE — Assessment & Plan Note (Signed)
 Low-carb diet and exercise.  Follow met b and A1c.

## 2024-05-17 NOTE — Assessment & Plan Note (Signed)
 Appears to be in SR today. Continue coreg and eliquis. Stable.

## 2024-05-17 NOTE — Assessment & Plan Note (Signed)
 Cough as outlined. Treat allergies. Continue nexium . Acid reflux controlled. Check cxr. Further w/jup pending results.

## 2024-05-17 NOTE — Telephone Encounter (Signed)
 Copied from CRM #[id number]. Topic: Referral - Question >> May 17, 2024  9:41 AM Franky GRADE wrote: Reason for CRM: [facility] is calling because they tried to contact patient to schedule due to a referral they received. Patient thought it was a scam and hung up the phone they attempted to call the patient two more times and the call went straight to v/m. They would like to know if we can give patient a call and advise that she was referred and to give them a call back to schedule as they will not be calling back [phone].  I spoke with patient and she states she was on the phone with [facility], and she asked them to hold on so she could go get her card, and when she returned to the phone, the call had been disconnected.  I gave patient their phone number and asked her to please call them so they can schedule an appointment for her.  Patient states she will.

## 2024-05-17 NOTE — Assessment & Plan Note (Signed)
Continue mevacor.  Low cholesterol diet and exercise.  Follow lipid panel and liver function tests.  

## 2024-05-17 NOTE — Assessment & Plan Note (Signed)
 Schedule mammogram.

## 2024-05-17 NOTE — Assessment & Plan Note (Signed)
 Follow cbc.

## 2024-05-17 NOTE — Assessment & Plan Note (Signed)
 Slightly decreased sodium. Eating. Recheck met b as outlined.

## 2024-05-17 NOTE — Assessment & Plan Note (Signed)
 Continue zoloft . Discussed. Appears to be stable.

## 2024-05-17 NOTE — Assessment & Plan Note (Signed)
Continue mevacor °

## 2024-05-17 NOTE — Assessment & Plan Note (Signed)
 Noted on recent labs. Avoid antiinflammatory medications. Recheck urine and met b as ordered.

## 2024-05-17 NOTE — Assessment & Plan Note (Signed)
 S/p EGD - gastritis.  Continue nexium . Upper symptoms controlled.

## 2024-05-17 NOTE — Assessment & Plan Note (Signed)
 On hydralazine and coreg. Pressure as outlined. No changes in medication. Follow pressures. Follow metabolic panel.

## 2024-05-19 DIAGNOSIS — R053 Chronic cough: Secondary | ICD-10-CM | POA: Diagnosis not present

## 2024-05-19 DIAGNOSIS — J301 Allergic rhinitis due to pollen: Secondary | ICD-10-CM | POA: Diagnosis not present

## 2024-05-19 DIAGNOSIS — R0602 Shortness of breath: Secondary | ICD-10-CM | POA: Diagnosis not present

## 2024-05-20 ENCOUNTER — Other Ambulatory Visit: Payer: Self-pay | Admitting: Emergency Medicine

## 2024-05-20 DIAGNOSIS — R0602 Shortness of breath: Secondary | ICD-10-CM

## 2024-05-20 DIAGNOSIS — R053 Chronic cough: Secondary | ICD-10-CM

## 2024-05-20 DIAGNOSIS — R058 Other specified cough: Secondary | ICD-10-CM

## 2024-05-20 DIAGNOSIS — J301 Allergic rhinitis due to pollen: Secondary | ICD-10-CM

## 2024-05-25 ENCOUNTER — Other Ambulatory Visit (INDEPENDENT_AMBULATORY_CARE_PROVIDER_SITE_OTHER)

## 2024-05-25 DIAGNOSIS — R944 Abnormal results of kidney function studies: Secondary | ICD-10-CM

## 2024-05-25 DIAGNOSIS — E871 Hypo-osmolality and hyponatremia: Secondary | ICD-10-CM

## 2024-05-25 LAB — BASIC METABOLIC PANEL WITH GFR
BUN: 15 mg/dL (ref 6–23)
CO2: 27 meq/L (ref 19–32)
Calcium: 8.7 mg/dL (ref 8.4–10.5)
Chloride: 99 meq/L (ref 96–112)
Creatinine, Ser: 1.01 mg/dL (ref 0.40–1.20)
GFR: 51.17 mL/min — ABNORMAL LOW (ref >60.00)
Glucose, Bld: 101 mg/dL — ABNORMAL HIGH (ref 70–99)
Potassium: 4.3 meq/L (ref 3.5–5.1)
Sodium: 135 meq/L (ref 135–145)

## 2024-05-26 LAB — URINALYSIS, ROUTINE W REFLEX MICROSCOPIC
Bilirubin Urine: NEGATIVE
Hgb urine dipstick: NEGATIVE
Ketones, ur: NEGATIVE
Nitrite: NEGATIVE
RBC / HPF: NONE SEEN (ref 0–?)
Specific Gravity, Urine: 1.01 (ref 1.000–1.030)
Total Protein, Urine: NEGATIVE
Urine Glucose: NEGATIVE
Urobilinogen, UA: 0.2 (ref 0.0–1.0)
pH: 6 (ref 5.0–8.0)

## 2024-05-26 NOTE — Addendum Note (Signed)
 Addended by: BRIEN SHARENE RAMAN on: 05/26/2024 09:47 AM   Modules accepted: Orders

## 2024-05-27 ENCOUNTER — Other Ambulatory Visit: Payer: Self-pay

## 2024-05-27 ENCOUNTER — Ambulatory Visit

## 2024-05-27 DIAGNOSIS — R300 Dysuria: Secondary | ICD-10-CM

## 2024-05-29 LAB — URINE CULTURE
MICRO NUMBER:: 16902342
SPECIMEN QUALITY:: ADEQUATE

## 2024-05-31 ENCOUNTER — Ambulatory Visit: Payer: Self-pay | Admitting: Internal Medicine

## 2024-05-31 ENCOUNTER — Other Ambulatory Visit: Payer: Self-pay

## 2024-05-31 DIAGNOSIS — R300 Dysuria: Secondary | ICD-10-CM

## 2024-05-31 MED ORDER — NITROFURANTOIN MONOHYD MACRO 100 MG PO CAPS: 100.0000 mg | ORAL_CAPSULE | Freq: Two times a day (BID) | ORAL | 0 refills | Status: AC

## 2024-06-01 ENCOUNTER — Other Ambulatory Visit: Payer: Self-pay | Admitting: Urology

## 2024-06-01 DIAGNOSIS — N3281 Overactive bladder: Secondary | ICD-10-CM

## 2024-06-15 ENCOUNTER — Ambulatory Visit
Admission: RE | Admit: 2024-06-15 | Discharge: 2024-06-15 | Disposition: A | Discharge status: Home / Self Care | Source: Ambulatory Visit | Attending: Internal Medicine | Admitting: Internal Medicine

## 2024-06-15 ENCOUNTER — Other Ambulatory Visit: Payer: Self-pay | Admitting: Nurse Practitioner

## 2024-06-15 DIAGNOSIS — Z1231 Encounter for screening mammogram for malignant neoplasm of breast: Secondary | ICD-10-CM | POA: Diagnosis not present

## 2024-06-15 DIAGNOSIS — M810 Age-related osteoporosis without current pathological fracture: Secondary | ICD-10-CM | POA: Diagnosis not present

## 2024-06-15 DIAGNOSIS — E2839 Other primary ovarian failure: Secondary | ICD-10-CM | POA: Insufficient documentation

## 2024-06-15 DIAGNOSIS — Z780 Asymptomatic menopausal state: Secondary | ICD-10-CM | POA: Diagnosis not present

## 2024-06-15 NOTE — Telephone Encounter (Signed)
 Prescription refill request for Eliquis  received. Indication:afib Last office visit:9/24 Scr:1.04  8/25 Age: 83 Weight:67.1  kg  Prescription refilled

## 2024-06-17 DIAGNOSIS — H90A31 Mixed conductive and sensorineural hearing loss, unilateral, right ear with restricted hearing on the contralateral side: Secondary | ICD-10-CM | POA: Diagnosis not present

## 2024-06-20 DIAGNOSIS — H90A31 Mixed conductive and sensorineural hearing loss, unilateral, right ear with restricted hearing on the contralateral side: Secondary | ICD-10-CM | POA: Diagnosis not present

## 2024-06-20 DIAGNOSIS — H6521 Chronic serous otitis media, right ear: Secondary | ICD-10-CM | POA: Diagnosis not present

## 2024-06-22 ENCOUNTER — Telehealth: Payer: Self-pay

## 2024-06-22 NOTE — Telephone Encounter (Signed)
 Copied from CRM #[id number]. Topic: General - Other >> Jun 22, 2024 12:11 PM Thersia C wrote: Reason for CRM: Patient called in stated she needs to talk to someone who sent her to [facility] clinic had some questions and also wanted to know if [facility] is in her network..   [phone]

## 2024-06-28 DIAGNOSIS — J301 Allergic rhinitis due to pollen: Secondary | ICD-10-CM | POA: Diagnosis not present

## 2024-06-28 DIAGNOSIS — R0602 Shortness of breath: Secondary | ICD-10-CM | POA: Diagnosis not present

## 2024-06-28 DIAGNOSIS — R058 Other specified cough: Secondary | ICD-10-CM | POA: Diagnosis not present

## 2024-06-28 DIAGNOSIS — R053 Chronic cough: Secondary | ICD-10-CM | POA: Diagnosis not present

## 2024-07-06 ENCOUNTER — Ambulatory Visit: Payer: Self-pay

## 2024-07-06 NOTE — Telephone Encounter (Signed)
 Patient reports pain with urination for the last couple of days. Concerned that she has another UTI. Patient states she is unable to come into the office and is asking if medication can be called in for her. Requesting a call back today.  FYI Only or Action Required?: Action required by provider: clinical question for provider.  Patient was last seen in primary care on 05/11/2024 by Glendia Shad, MD.  Called Nurse Triage reporting Dysuria.  Symptoms began several days ago.  Interventions attempted: Rest, hydration, or home remedies.  Symptoms are: unchanged.  Triage Disposition: See HCP Within 4 Hours (Or PCP Triage)  Patient/caregiver understands and will follow disposition?: No, wishes to speak with PCP  Copied from CRM #[id number]. Topic: Clinical - Red Word Triage >> Jul 06, 2024 10:25 AM Rea BROCKS wrote: Red Word that prompted transfer to Nurse Triage: UTI flared up again- can't come in today. Patient is asking if something can be called in. Reason for Disposition  [1] SEVERE pain with urination (e.g., excruciating) AND [2] not improved after 2 hours of pain medicine  Answer Assessment - Initial Assessment Questions 1. SEVERITY: How bad is the pain?  (e.g., Scale 1-10; mild, moderate, or severe)     8 out of 10 2. FREQUENCY: How many times have you had painful urination today?      Couple of times a day 3. PATTERN: Is pain present every time you urinate or just sometimes?      Every time she goes to the bathroom 4. ONSET: When did the painful urination start?      Couple of days ago 5. FEVER: Do you have a fever? If Yes, ask: What is your temperature, how was it measured, and when did it start?     no 6. PAST UTI: Have you had a urine infection before? If Yes, ask: When was the last time? and What happened that time?      yes 7. CAUSE: What do you think is causing the painful urination?  (e.g., UTI, scratch, Herpes sore)     Possible UTI 8. OTHER SYMPTOMS:  Do you have any other symptoms? (e.g., blood in urine, flank pain, genital sores, urgency, vaginal discharge)     no  Protocols used: Urination Pain - Female-A-AH

## 2024-07-06 NOTE — Telephone Encounter (Signed)
 Lvm for pt to give office a call back. Please transfer call to clinic

## 2024-07-07 ENCOUNTER — Ambulatory Visit
Admission: EM | Admit: 2024-07-07 | Discharge: 2024-07-07 | Disposition: A | Discharge status: Home / Self Care | Attending: Emergency Medicine | Admitting: Emergency Medicine

## 2024-07-07 DIAGNOSIS — R300 Dysuria: Secondary | ICD-10-CM | POA: Diagnosis not present

## 2024-07-07 DIAGNOSIS — N390 Urinary tract infection, site not specified: Secondary | ICD-10-CM | POA: Insufficient documentation

## 2024-07-07 LAB — POCT URINE DIPSTICK
Bilirubin, UA: NEGATIVE
Glucose, UA: NEGATIVE mg/dL
Ketones, POC UA: NEGATIVE mg/dL
Nitrite, UA: NEGATIVE
POC PROTEIN,UA: NEGATIVE
Spec Grav, UA: 1.01 (ref 1.010–1.025)
Urobilinogen, UA: 1 U/dL
pH, UA: 7 (ref 5.0–8.0)

## 2024-07-07 MED ORDER — CEFDINIR 300 MG PO CAPS: 300.0000 mg | ORAL_CAPSULE | Freq: Two times a day (BID) | ORAL | 0 refills | Status: AC

## 2024-07-07 NOTE — Discharge Instructions (Addendum)
 Take the antibiotic as directed.  The urine culture is pending.  We will call you if it shows the need to change or discontinue your antibiotic.    Follow-up with your primary care provider or your urologist.

## 2024-07-07 NOTE — ED Provider Notes (Signed)
 Rachael Austin    CSN: [id number] Arrival date & time: 07/07/24  1245      History   Chief Complaint No chief complaint on file.   HPI Rachael Austin is a 83 y.o. female.  Accompanied by her daughter, patient presents with 3-day history of dysuria.  No fever, chills, hematuria, abdominal pain, flank pain.  No OTC medications taken today.  Patient has history of recurrent UTIs.  She is followed by urology; last seen on 12/01/2023.  The history is provided by the patient, a relative and medical records.    Past Medical History:  Diagnosis Date   AK (actinic keratosis) 11/12/2022   left upper arm, tx'd with EDC   AK (actinic keratosis) 11/12/2022   right medial pretibia, LN2 11/25/22   Basal cell carcinoma 06/21/2020   left post shoulder sup, left mid chest, left lat thigh   Basal cell carcinoma 11/07/2021   L upper back, EDC   Basal cell carcinoma 11/07/2021   R upper back, EDC   Basal cell carcinoma 11/27/2021   right upper arm, EDC   Basal cell carcinoma 11/27/2021   right shoulder posterior, EDC   Basal cell carcinoma 11/27/2021   right anterior shoulder, EDC   Basal cell carcinoma 03/09/2024   posterior neck, EDC   Basal cell carcinoma (BCC) 06/13/2021   left dorsal foot, EDC 10/02/2021   Basal cell carcinoma (BCC) 06/13/2021   right postauricular tx'd w/ EDC   BCC (basal cell carcinoma of skin) 03/30/2007   R inf med pretibial - BCC   BCC (basal cell carcinoma of skin) 10/12/2013   L nasal ala - BCC   BCC (basal cell carcinoma of skin) 03/10/2018   L mid dorsum med forearm - superficial BCC    BCC (basal cell carcinoma) 10/02/2021   left dorsal foot proximal, EDC 10/02/2021   BCC (basal cell carcinoma) 10/02/2021   left mid back, superficial EDC 11/07/21   BCC (basal cell carcinoma) 10/02/2021   right pretibia   BCC (basal cell carcinoma) 10/02/2021   right mid back, superficial EDC 11/07/21   BCC (basal cell carcinoma) 08/14/2022   left distal calf, tx'd  with EDC   BCC (basal cell carcinoma) 11/12/2022   superficial at left lateral pretibial,l tx'd with EDC   BCC (basal cell carcinoma) 11/12/2022   superficial at mid back right of midline, scheduled for EDC   BCC (basal cell carcinoma) 11/12/2022   mid back right of midline, [facility] 11/25/22   Breast screening, unspecified 2013   Cancer (HCC) 1992   skin   Coronary artery calcification seen on CT scan    Degenerative disk disease    Diastolic dysfunction    a. 10/2021 Echo: EF 60-65%, no rwma, GrI DD, mild MR; b. 01/2023 Echo: EF 65-70%, no rwma, mild LVH, GrI DD Nl RV fxn. Triv MR. Mild-mod TR. AoV sclerosis.   GERD (gastroesophageal reflux disease)    History of basal cell carcinoma (BCC) 08/29/2020    left inferior knee lateral, , left inferior knee medial, right pretibia   History of SCC (squamous cell carcinoma) of skin 08/29/2020   right posterior calf, left lateral calf, and    Hypercholesterolemia 2008   Interstitial cystitis    followed by Dr Ike   Near syncope    Other sign and symptom in breast 2013   Left upper outer quadrant breast soreness Ultrasound exam of right breast in the 2 o'clock position with the breast distracted medially showed  a 0.3-0.4 with 0.5 cm simple cyst. In the 1 o'clock position where pt reported tenderness US  exam was negatiive.  Because of her history of intermittent nipple drainage, ultrasound was completed of the retroareolar area.   PAF (paroxysmal atrial fibrillation) (HCC)    a. dx 01/2023-->Eliquis  5 BID (CHA2DS2VASc = 5).   Personal history of tobacco use, presenting hazards to health    PONV (postoperative nausea and vomiting)    Primary hypertension    SCC (squamous cell carcinoma) 03/28/2008   R dorsum hand - SCC   SCC (squamous cell carcinoma) 05/09/2009   R lat lower leg - SCCIS   SCC (squamous cell carcinoma) 05/09/2009   L lat lower leg - SCCIS   SCC (squamous cell carcinoma) 04/07/2013   L lower leg - SCCIS   SCC (squamous cell  carcinoma) 04/27/2013   R forearm - SCC   SCC (squamous cell carcinoma) 04/28/2017   L lat knee - SCCIS   SCC (squamous cell carcinoma) 05/12/2018   L prox dorsum forearm - SCC   SCC (squamous cell carcinoma) 06/13/2021   left forearm tx'd w/ EDC   SCC (squamous cell carcinoma), leg, right 03/30/2007   R sup pretibial - SCCIS   SCC (squamous cell carcinoma);BCC 10/12/2013   Mid back - superficial BCC with SCCIS    Special screening for malignant neoplasms, colon    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   left dorsal forearm, left lat calf   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   left lower leg superior, EDC at follow up   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   chest right of midline, tx'd with [facility]    Patient Active Problem List   Diagnosis Date Noted   Decreased GFR 12/19/2023   Daytime somnolence 12/19/2023   UTI symptoms 09/21/2023   Hematuria 09/21/2023   Cough 08/28/2023   B12 deficiency 08/28/2023   Hair loss 07/26/2023   Hyponatremia 06/01/2023   Abnormal CXR 05/12/2023   Swelling of lower extremity 04/26/2023   Anemia 03/05/2023   Hypokalemia 02/06/2023   Subcapital fracture of hip, left, closed, initial encounter (HCC) 02/04/2023   PAF (paroxysmal atrial fibrillation) (HCC) 02/04/2023   Multifocal pneumonia 02/04/2023   History of hip fracture 02/02/2023   Hypertensive urgency 02/01/2023   Prolonged QT interval 02/01/2023   Fall 06/28/2022   Thoracic arthritis 06/26/2022   Osteoporosis 06/26/2022   At high risk for falls 06/26/2022   Multiple closed fractures of ribs of left side 06/26/2022   Leg weakness 10/23/2021   SOB (shortness of breath) 10/22/2021   Vaginal irritation 08/13/2021   Constipation 08/13/2021   Dysuria 05/26/2021   Skin lesion 03/23/2021   Breast cancer screening 03/23/2021   Near syncope 01/29/2021   Hyperglycemia 10/18/2020   Left carotid bruit 04/07/2020   Post-nasal drainage 01/29/2020   Chest  tightness 10/04/2017   Aortic atherosclerosis 07/02/2017   Special screening for malignant neoplasms, colon    Hiatal hernia    H/O: osteoarthritis 02/05/2016   H/O neoplasm 02/19/2015   H/O Malignant melanoma 02/19/2015   Essential hypertension 02/14/2015   Health care maintenance 02/14/2015   Abnormal involuntary movement 07/27/2014   Stress 05/09/2014   Abdominal pain 02/21/2014   Hearing loss 08/16/2013   GERD (gastroesophageal reflux disease) 06/17/2013   Acid reflux 06/17/2013   Detrusor dyssynergia 02/04/2013   Tremor    Hypercholesterolemia 08/09/2012   Chronic interstitial cystitis 08/09/2012    Past Surgical History:  Procedure  Laterality Date   ABDOMINAL HYSTERECTOMY  1992   APPENDECTOMY     CERVICAL DISCECTOMY     S/P C7-T1 discectomy with fusion   CHOLECYSTECTOMY  1990   COLONOSCOPY WITH PROPOFOL  N/A 02/14/2016   Procedure: COLONOSCOPY WITH PROPOFOL ;  Surgeon: Rogelia Copping, MD;  Location: [facility];  Service: Endoscopy;  Laterality: N/A;  PT WOULD LIKE 10 ARRIVAL TIME OR LATER   DILATION AND CURETTAGE OF UTERUS     ESOPHAGOGASTRODUODENOSCOPY (EGD) WITH PROPOFOL  N/A 02/14/2016   Procedure: ESOPHAGOGASTRODUODENOSCOPY (EGD) WITH PROPOFOL  with dialtion;  Surgeon: Rogelia Copping, MD;  Location: [facility];  Service: Endoscopy;  Laterality: N/A;   ESOPHAGOGASTRODUODENOSCOPY (EGD) WITH PROPOFOL  N/A 04/13/2019   Procedure: ESOPHAGOGASTRODUODENOSCOPY (EGD) WITH PROPOFOL ;  Surgeon: Toledo, Ladell POUR, MD;  Location: [facility];  Service: Gastroenterology;  Laterality: N/A;   HIP PINNING,CANNULATED Left 02/02/2023   Procedure: PERCUTANEOUS FIXATION OF FEMORAL NECK;  Surgeon: Marchia Drivers, MD;  Location: [facility];  Service: Orthopedics;  Laterality: Left;   MELANOMA EXCISION     removed from Left calf 1994    OB History     Gravida  2   Para      Term      Preterm      AB      Living  2      SAB      IAB      Ectopic      Multiple       Live Births           Obstetric Comments  Age with first menstruation-13 Age with first pregnancy-17 LMP-age 82, hysterectomy          Home Medications    Prior to Admission medications   Medication Sig Start Date End Date Taking? Authorizing Provider  cefdinir  (OMNICEF ) 300 MG capsule Take 1 capsule (300 mg total) by mouth 2 (two) times daily for 5 days. 07/07/24 07/12/24 Yes Corlis Burnard DEL, NP  acetaminophen  (TYLENOL ) 325 MG tablet Take 650 mg by mouth as needed.    [provider]  apixaban  (ELIQUIS ) 5 MG TABS tablet Take 1 tablet by mouth twice daily 06/15/24   Vivienne Lonni Ingle, NP  carvedilol  (COREG ) 25 MG tablet TAKE 1 TABLET BY MOUTH TWICE DAILY WITH A MEAL 09/28/23   Vivienne Lonni Ingle, NP  conjugated estrogens  (PREMARIN ) vaginal cream Place 1 Applicatorful vaginally daily. Apply 0.5mg  (pea-sized amount)  just inside the vaginal introitus with a finger-tip on  Monday, Wednesday and Friday nights. 07/15/23   Helon Kirsch A, PA-C  esomeprazole  (NEXIUM ) 40 MG capsule Take 1 capsule (40 mg total) by mouth daily. 05/11/24   Glendia Shad, MD  hydrALAZINE  (APRESOLINE ) 25 MG tablet Take 1 tablet (25 mg total) by mouth 2 (two) times daily. 01/28/24   Glendia Shad, MD  imipramine  (TOFRANIL ) 10 MG tablet Take 10 mg by mouth at bedtime. 06/05/23   [provider]  ipratropium (ATROVENT ) 0.03 % nasal spray Place 2 sprays into both nostrils every 12 (twelve) hours. 08/19/23   Hope Merle, MD  lovastatin  (MEVACOR ) 20 MG tablet Take 1 tablet (20 mg total) by mouth at bedtime. 05/11/24   Glendia Shad, MD  nystatin  cream (MYCOSTATIN ) Apply 1 Application topically 2 (two) times daily. 11/20/23   Glendia Shad, MD  sertraline  (ZOLOFT ) 50 MG tablet Take 1 tablet (50 mg total) by mouth daily. 05/11/24   Glendia Shad, MD  trospium  (SANCTURA ) 20 MG tablet Take 1 tablet by mouth once daily  06/06/24   Helon Clotilda LABOR, PA-C    Family History Family History   Problem Relation Age of Onset   Hodgkin's lymphoma Mother    Heart failure Father    Heart attack Father    Arthritis Sister        Three sisters w/ degeneratve disk disease   Headache Sister        Two sisters hx of headache   Breast cancer Neg Hx    Colon cancer Neg Hx    Bladder Cancer Neg Hx    Kidney cancer Neg Hx     Social History Social History   Tobacco Use   Smoking status: Former    Current packs/day: 1.00    Average packs/day: 1 pack/day for 15.0 years (15.0 ttl pk-yrs)    Types: Cigarettes   Smokeless tobacco: Never  Vaping Use   Vaping status: Never Used  Substance Use Topics   Alcohol use: No    Alcohol/week: 0.0 standard drinks of alcohol   Drug use: No     Allergies   Augmentin [amoxicillin-pot clavulanate], Lexapro  [escitalopram  oxalate], Micardis  [telmisartan ], Myrbetriq [mirabegron], Niacin and related, Codeine sulfate, and Vesicare  [solifenacin  succinate]   Review of Systems Review of Systems  Constitutional:  Negative for chills and fever.  Gastrointestinal:  Negative for abdominal pain.  Genitourinary:  Positive for dysuria. Negative for flank pain and hematuria.     Physical Exam Triage Vital Signs ED Triage Vitals [07/07/24 1356]  Encounter Vitals Group     BP 139/74     Girls Systolic BP Percentile      Girls Diastolic BP Percentile      Boys Systolic BP Percentile      Boys Diastolic BP Percentile      Pulse Rate (!) 59     Resp 18     Temp 97.8 F (36.6 C)     Temp src      SpO2 98 %     Weight      Height      Head Circumference      Peak Flow      Pain Score      Pain Loc      Pain Education      Exclude from Growth Chart    No data found.  Updated Vital Signs BP 139/74   Pulse (!) 59   Temp 97.8 F (36.6 C)   Resp 18   LMP 11/20/1976   SpO2 98%   Visual Acuity Right Eye Distance:   Left Eye Distance:   Bilateral Distance:    Right Eye Near:   Left Eye Near:    Bilateral Near:     Physical  Exam Constitutional:      General: She is not in acute distress. HENT:     Mouth/Throat:     Mouth: Mucous membranes are moist.  Cardiovascular:     Rate and Rhythm: Normal rate and regular rhythm.  Pulmonary:     Effort: Pulmonary effort is normal. No respiratory distress.  Abdominal:     General: Bowel sounds are normal.     Palpations: Abdomen is soft.     Tenderness: There is no abdominal tenderness. There is no right CVA tenderness, left CVA tenderness, guarding or rebound.  Neurological:     Mental Status: She is alert.      UC Treatments / Results  Labs (all labs ordered are listed, but only abnormal results are displayed) Labs Reviewed  POCT URINE  DIPSTICK - Abnormal; Notable for the following components:      Result Value   Clarity, UA cloudy (*)    Blood, UA trace-intact (*)    Leukocytes, UA Large (3+) (*)    All other components within normal limits  URINE CULTURE    EKG   Radiology No results found.  Procedures Procedures (including critical care time)  Medications Ordered in UC Medications - No data to display  Initial Impression / Assessment and Plan / UC Course  I have reviewed the triage vital signs and the nursing notes.  Pertinent labs & imaging results that were available during my care of the patient were reviewed by me and considered in my medical decision making (see chart for details).   Dysuria, recurrent UTIs.  Afebrile and vital signs are stable.  Based on previous urine cultures, treating today with cefdinir  x 5 days.  Urine culture pending.  Instructed patient to follow-up with her PCP or urologist.  Education provided on dysuria.  Patient agrees to plan of care.    Final Clinical Impressions(s) / UC Diagnoses   Final diagnoses:  Dysuria  Recurrent UTI     Discharge Instructions      Take the antibiotic as directed.  The urine culture is pending.  We will call you if it shows the need to change or discontinue your antibiotic.     Follow-up with your primary care provider or your urologist.     ED Prescriptions     Medication Sig Dispense Auth. Provider   cefdinir  (OMNICEF ) 300 MG capsule Take 1 capsule (300 mg total) by mouth 2 (two) times daily for 5 days. 10 capsule Corlis Burnard DEL, NP      PDMP not reviewed this encounter.   Corlis Burnard DEL, NP 07/07/24 [phone]

## 2024-07-07 NOTE — Telephone Encounter (Signed)
 Reviewed. We offered her an appt at 10:30 07/06/24.  See her note. Plans for evaluation at another office.

## 2024-07-09 LAB — URINE CULTURE: Culture: >=100000 — AB

## 2024-07-11 ENCOUNTER — Ambulatory Visit (HOSPITAL_COMMUNITY): Payer: Self-pay

## 2024-07-14 ENCOUNTER — Ambulatory Visit: Admitting: Urology

## 2024-07-14 ENCOUNTER — Encounter: Payer: Self-pay | Admitting: Urology

## 2024-07-14 VITALS — BP 122/74 | HR 47 | Ht 64.0 in | Wt 148.0 lb

## 2024-07-14 DIAGNOSIS — N958 Other specified menopausal and perimenopausal disorders: Secondary | ICD-10-CM

## 2024-07-14 DIAGNOSIS — N3010 Interstitial cystitis (chronic) without hematuria: Secondary | ICD-10-CM | POA: Diagnosis not present

## 2024-07-14 DIAGNOSIS — N3281 Overactive bladder: Secondary | ICD-10-CM | POA: Diagnosis not present

## 2024-07-14 LAB — URINALYSIS, COMPLETE
Bilirubin, UA: NEGATIVE
Glucose, UA: NEGATIVE
Ketones, UA: NEGATIVE
Nitrite, UA: NEGATIVE
Protein,UA: NEGATIVE
RBC, UA: NEGATIVE
Specific Gravity, UA: 1.01 (ref 1.005–1.030)
Urobilinogen, Ur: 1 mg/dL (ref 0.2–1.0)
pH, UA: 6.5 (ref 5.0–7.5)

## 2024-07-14 LAB — MICROSCOPIC EXAMINATION
Epithelial Cells (non renal): >10 /HPF — AB (ref 0–10)
RBC, Urine: NONE SEEN /HPF (ref 0–2)

## 2024-07-14 NOTE — Progress Notes (Signed)
 07/14/2024 1:39 PM   Rachael Austin [birth year] [id number]  Referring provider: Glendia Shad, MD [address] [place],  [place] [address]  Urological history: 1. OAB -Contributing factors of age, GSM, hypertension, detrusor dyssynergia, hyperglycemia, COVID, constipation, antihistamines and smoking history -trospium  IR 20 mg   2. IC  -contributing factors of age, GSM and constipation -Imipramine  10 mg daily -cannot take due to QT prolongation  Chief Complaint  Patient presents with   Dysuria   Vaginal Atrophy   HPI: Rachael Austin is a 83 y.o. woman who presents today for dysuria, recurrent UTIs and urgent care follow-up.  Previous records reviewed.   She was seen in urgent care on July 07, 2024 for 3 days of dysuria.  Urine dip was yellow cloudy, specific gravity 1.010, trace heme, pH 7.0, and large leukocytes.  Urine culture was positive for E. coli.  She was placed on 10 days of Omnicef .  She had a renal ultrasound back in April which was normal.  She was having symptoms of burning, hurting, chills, running up from her suprapubic area through the umbilicus into her sternum when she urinates.  These symptoms do not abate while she is on antibiotics.   Her urinalysis is yellow slightly cloudy, specific already 1.010, pH 6.5, trace leukocyte, 6-10 WBCs, no RBCs, greater than 10 epithelial cells and a few bacteria.  PMH: Past Medical History:  Diagnosis Date   AK (actinic keratosis) 11/12/2022   left upper arm, tx'd with EDC   AK (actinic keratosis) 11/12/2022   right medial pretibia, LN2 11/25/22   Basal cell carcinoma 06/21/2020   left post shoulder sup, left mid chest, left lat thigh   Basal cell carcinoma 11/07/2021   L upper back, EDC   Basal cell carcinoma 11/07/2021   R upper back, EDC   Basal cell carcinoma 11/27/2021   right upper arm, EDC   Basal cell carcinoma 11/27/2021   right shoulder posterior, EDC   Basal cell  carcinoma 11/27/2021   right anterior shoulder, EDC   Basal cell carcinoma 03/09/2024   posterior neck, EDC   Basal cell carcinoma (BCC) 06/13/2021   left dorsal foot, EDC 10/02/2021   Basal cell carcinoma (BCC) 06/13/2021   right postauricular tx'd w/ EDC   BCC (basal cell carcinoma of skin) 03/30/2007   R inf med pretibial - BCC   BCC (basal cell carcinoma of skin) 10/12/2013   L nasal ala - BCC   BCC (basal cell carcinoma of skin) 03/10/2018   L mid dorsum med forearm - superficial BCC    BCC (basal cell carcinoma) 10/02/2021   left dorsal foot proximal, EDC 10/02/2021   BCC (basal cell carcinoma) 10/02/2021   left mid back, superficial EDC 11/07/21   BCC (basal cell carcinoma) 10/02/2021   right pretibia   BCC (basal cell carcinoma) 10/02/2021   right mid back, superficial EDC 11/07/21   BCC (basal cell carcinoma) 08/14/2022   left distal calf, tx'd with EDC   BCC (basal cell carcinoma) 11/12/2022   superficial at left lateral pretibial,l tx'd with EDC   BCC (basal cell carcinoma) 11/12/2022   superficial at mid back right of midline, scheduled for EDC   BCC (basal cell carcinoma) 11/12/2022   mid back right of midline, [facility] 11/25/22   Breast screening, unspecified 2013   Cancer (HCC) 1992   skin   Coronary artery calcification seen on CT scan    Degenerative disk disease    Diastolic  dysfunction    a. 10/2021 Echo: EF 60-65%, no rwma, GrI DD, mild MR; b. 01/2023 Echo: EF 65-70%, no rwma, mild LVH, GrI DD Nl RV fxn. Triv MR. Mild-mod TR. AoV sclerosis.   GERD (gastroesophageal reflux disease)    History of basal cell carcinoma (BCC) 08/29/2020    left inferior knee lateral, , left inferior knee medial, right pretibia   History of SCC (squamous cell carcinoma) of skin 08/29/2020   right posterior calf, left lateral calf, and    Hypercholesterolemia 2008   Interstitial cystitis    followed by Dr Ike   Near syncope    Other sign and symptom in breast 2013   Left upper outer  quadrant breast soreness Ultrasound exam of right breast in the 2 o'clock position with the breast distracted medially showed a 0.3-0.4 with 0.5 cm simple cyst. In the 1 o'clock position where pt reported tenderness US  exam was negatiive.  Because of her history of intermittent nipple drainage, ultrasound was completed of the retroareolar area.   PAF (paroxysmal atrial fibrillation) (HCC)    a. dx 01/2023-->Eliquis  5 BID (CHA2DS2VASc = 5).   Personal history of tobacco use, presenting hazards to health    PONV (postoperative nausea and vomiting)    Primary hypertension    SCC (squamous cell carcinoma) 03/28/2008   R dorsum hand - SCC   SCC (squamous cell carcinoma) 05/09/2009   R lat lower leg - SCCIS   SCC (squamous cell carcinoma) 05/09/2009   L lat lower leg - SCCIS   SCC (squamous cell carcinoma) 04/07/2013   L lower leg - SCCIS   SCC (squamous cell carcinoma) 04/27/2013   R forearm - SCC   SCC (squamous cell carcinoma) 04/28/2017   L lat knee - SCCIS   SCC (squamous cell carcinoma) 05/12/2018   L prox dorsum forearm - SCC   SCC (squamous cell carcinoma) 06/13/2021   left forearm tx'd w/ EDC   SCC (squamous cell carcinoma), leg, right 03/30/2007   R sup pretibial - SCCIS   SCC (squamous cell carcinoma);BCC 10/12/2013   Mid back - superficial BCC with SCCIS    Special screening for malignant neoplasms, colon    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   left dorsal forearm, left lat calf   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   left lower leg superior, EDC at follow up   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   chest right of midline, tx'd with [facility]    Surgical History: Past Surgical History:  Procedure Laterality Date   ABDOMINAL HYSTERECTOMY  1992   APPENDECTOMY     CERVICAL DISCECTOMY     S/P C7-T1 discectomy with fusion   CHOLECYSTECTOMY  1990   COLONOSCOPY WITH PROPOFOL  N/A 02/14/2016   Procedure: COLONOSCOPY WITH PROPOFOL ;  Surgeon:  Rogelia Copping, MD;  Location: [facility];  Service: Endoscopy;  Laterality: N/A;  PT WOULD LIKE 10 ARRIVAL TIME OR LATER   DILATION AND CURETTAGE OF UTERUS     ESOPHAGOGASTRODUODENOSCOPY (EGD) WITH PROPOFOL  N/A 02/14/2016   Procedure: ESOPHAGOGASTRODUODENOSCOPY (EGD) WITH PROPOFOL  with dialtion;  Surgeon: Rogelia Copping, MD;  Location: [facility];  Service: Endoscopy;  Laterality: N/A;   ESOPHAGOGASTRODUODENOSCOPY (EGD) WITH PROPOFOL  N/A 04/13/2019   Procedure: ESOPHAGOGASTRODUODENOSCOPY (EGD) WITH PROPOFOL ;  Surgeon: Toledo, Ladell POUR, MD;  Location: [facility];  Service: Gastroenterology;  Laterality: N/A;   HIP PINNING,CANNULATED Left 02/02/2023   Procedure: PERCUTANEOUS FIXATION OF FEMORAL NECK;  Surgeon: Marchia Drivers, MD;  Location: [facility];  Service: Orthopedics;  Laterality: Left;   MELANOMA EXCISION     removed from Left calf 1994    Home Medications:  Allergies as of 07/14/2024       Reactions   Augmentin [amoxicillin-pot Clavulanate] Swelling, Rash   Swelling of the lips   Lexapro  [escitalopram  Oxalate]    Micardis  [telmisartan ] Itching   Myrbetriq [mirabegron] Itching   Niacin And Related Itching   Codeine Sulfate Other (See Comments)   Makes her hyper   Vesicare  [solifenacin  Succinate] Nausea And Vomiting, Rash        Medication List        Accurate as of July 14, 2024  1:39 PM. If you have any questions, ask your nurse or doctor.          acetaminophen  325 MG tablet Commonly known as: TYLENOL  Take 650 mg by mouth as needed.   carvedilol  25 MG tablet Commonly known as: COREG  TAKE 1 TABLET BY MOUTH TWICE DAILY WITH A MEAL   Eliquis  5 MG Tabs tablet Generic drug: apixaban  Take 1 tablet by mouth twice daily   esomeprazole  40 MG capsule Commonly known as: NEXIUM  Take 1 capsule (40 mg total) by mouth daily.   hydrALAZINE  25 MG tablet Commonly known as: APRESOLINE  Take 1 tablet (25 mg total) by mouth 2 (two) times daily.    imipramine  10 MG tablet Commonly known as: TOFRANIL  Take 10 mg by mouth at bedtime.   ipratropium 0.03 % nasal spray Commonly known as: ATROVENT  Place 2 sprays into both nostrils every 12 (twelve) hours.   lovastatin  20 MG tablet Commonly known as: MEVACOR  Take 1 tablet (20 mg total) by mouth at bedtime.   nystatin  cream Commonly known as: MYCOSTATIN  Apply 1 Application topically 2 (two) times daily.   Premarin  vaginal cream Generic drug: conjugated estrogens  Place 1 Applicatorful vaginally daily. Apply 0.5mg  (pea-sized amount)  just inside the vaginal introitus with a finger-tip on  Monday, Wednesday and Friday nights.   sertraline  50 MG tablet Commonly known as: ZOLOFT  Take 1 tablet (50 mg total) by mouth daily.   trospium  20 MG tablet Commonly known as: SANCTURA  Take 1 tablet by mouth once daily        Allergies:  Allergies  Allergen Reactions   Augmentin [Amoxicillin-Pot Clavulanate] Swelling and Rash    Swelling of the lips   Lexapro  [Escitalopram  Oxalate]    Micardis  [Telmisartan ] Itching   Myrbetriq [Mirabegron] Itching   Niacin And Related Itching   Codeine Sulfate Other (See Comments)    Makes her hyper   Vesicare  [Solifenacin  Succinate] Nausea And Vomiting and Rash    Family History: Family History  Problem Relation Age of Onset   Hodgkin's lymphoma Mother    Heart failure Father    Heart attack Father    Arthritis Sister        Three sisters w/ degeneratve disk disease   Headache Sister        Two sisters hx of headache   Breast cancer Neg Hx    Colon cancer Neg Hx    Bladder Cancer Neg Hx    Kidney cancer Neg Hx     Social History:  reports that she has quit smoking. Her smoking use included cigarettes. She has a 15 pack-year smoking history. She has never used smokeless tobacco. She reports that she does not drink alcohol and does not use drugs.  ROS: Pertinent ROS in HPI  Physical Exam: BP 122/74   Pulse ROLLEN)  47   Ht 5' 4 (1.626  m)   Wt 148 lb (67.1 kg)   LMP 11/20/1976   SpO2 96%   BMI 25.40 kg/m   Constitutional:  Well nourished. Alert and oriented, No acute distress. HEENT: [place] AT, moist mucus membranes.  Trachea midline Cardiovascular: No clubbing, cyanosis, or edema. Respiratory: Normal respiratory effort, no increased work of breathing. Neurologic: Grossly intact, no focal deficits, moving all 4 extremities. Psychiatric: Normal mood and affect.    Laboratory Data See HPI and Epic I have reviewed the labs.   Pertinent Imaging: CLINICAL DATA:  Decreased GFR   EXAM: RENAL / URINARY TRACT ULTRASOUND COMPLETE   COMPARISON:  Limited abdomen ultrasound December 09, 2010   FINDINGS: Right Kidney:   Renal measurements: 11.2 x 3.7 x 4.7 cm = volume: 101 mL. Echogenicity within normal limits. No mass or hydronephrosis visualized.   Left Kidney:   Renal measurements: 7.6 x 3.7 x 3.9 cm = volume: 57 mL. Echogenicity within normal limits. No mass or hydronephrosis visualized.   Bladder:   Appears normal for degree of bladder distention.   Other:   None.   IMPRESSION: No acute abnormality identified.     Electronically Signed   By: Craig Farr M.D.   On: 12/30/2023 09:08 I have independently reviewed the films.  See HPI.    Assessment & Plan:    1. Genitourinary Syndrome of Menopause (GSM)  - Explained that GSM is a common condition that affects women during and after menopause. It is characterized by a cluster of symptoms related to the genital and urinary tracts, including: vaginal dryness, pain or discomfort during intercourse (dyspareunia), burning or irritation in the vulva or vagina, thinning or loss of vaginal tissue, frequent urination, urgency (feeling the need to urinate immediately), incontinence (loss of bladder control), and pain or burning during urination - explained that GSM is primarily caused by the decline in estrogen levels during menopause. This leads to changes in the  vaginal tissue, making it thinner, drier, and more susceptible to irritation. Other factors that may contribute to GSM include: Smoking, certain medications (e.g., chemotherapy drugs, and pelvic organ prolapse - advised that First-line treatment options are: Local low-dose vaginal estrogen (cream, ring, tablet), which improves dryness, irritation, dyspareunia and it is safe for most patients, including those with history of breast cancer (with multidisciplinary input). - advised they can also use vaginal moisturizers and lubricants (alone or combined) and avoid irritants and harsh cleansers.  Pelvic floor physical therapy for dysfunction is also helpful.  Referral to sex therapy or counseling if psychosocial concerns are present. - Reassured that there is no evidence linking local estrogen to breast or endometrial cancer - Explained that it may take up to 8 months of consistent application of the vaginal estrogen cream to cause unnecessary changes needed to address GSM and that this will be a lifelong medication - she states she is forgetful with the cream  - Will start with vaginal estrogen cream, applying a pea-sized amount with a fingertip just inside the vaginal introitus every night for 30 days and then continuing on to Monday, Wednesday and Friday nights - estradiol (ESTRACE) 0.01 % CREA vaginal cream, Apply one pea-sized amount around the opening of the urethra daily for 30 days, then 3 times weekly moving forward   2. OAB wet -Continue [place]  IR 20 mg daily  3. IC -Cannot be on imipramine  due to her history of QT prolongation  4. rUTI's vs IC symptoms - UA  bland today - urine sent for cytology   These notes generated with voice recognition software. I apologize for typographical errors.  Rachael Austin  [facility] [address]  [address] [place], [place] [address] [phone]

## 2024-07-14 NOTE — Addendum Note (Signed)
 Addended by: DEBBY LAYMON HERO on: 07/14/2024 03:33 PM   Modules accepted: Orders

## 2024-07-20 ENCOUNTER — Ambulatory Visit: Payer: Self-pay | Admitting: Urology

## 2024-07-20 LAB — CYTOLOGY PLUS MONITORING PROFILE: PAP & FEULGEN

## 2024-07-26 DIAGNOSIS — H6521 Chronic serous otitis media, right ear: Secondary | ICD-10-CM | POA: Diagnosis not present

## 2024-07-26 DIAGNOSIS — H90A31 Mixed conductive and sensorineural hearing loss, unilateral, right ear with restricted hearing on the contralateral side: Secondary | ICD-10-CM | POA: Diagnosis not present

## 2024-08-10 ENCOUNTER — Telehealth: Payer: Self-pay

## 2024-08-10 ENCOUNTER — Other Ambulatory Visit: Payer: Self-pay

## 2024-08-10 DIAGNOSIS — R350 Frequency of micturition: Secondary | ICD-10-CM

## 2024-08-10 DIAGNOSIS — R300 Dysuria: Secondary | ICD-10-CM

## 2024-08-10 NOTE — Telephone Encounter (Signed)
 Please clarify symptoms. Ok to place orders.  Will also need to keep her 08/16/24 appt

## 2024-08-10 NOTE — Telephone Encounter (Signed)
 Copied from CRM #[id number]. Topic: Clinical - Request for Lab/Test Order >> Aug 10, 2024 10:10 AM Carlyon D wrote: Reason for CRM: PT is calling in regards to a possible UTI she is asking if a urine sample can be done as well tomorrow at her lab visit.  Pt would like a call back to know the order has been entered for a urine sample to be collected

## 2024-08-11 ENCOUNTER — Other Ambulatory Visit (INDEPENDENT_AMBULATORY_CARE_PROVIDER_SITE_OTHER)

## 2024-08-11 DIAGNOSIS — R350 Frequency of micturition: Secondary | ICD-10-CM

## 2024-08-11 DIAGNOSIS — R739 Hyperglycemia, unspecified: Secondary | ICD-10-CM | POA: Diagnosis not present

## 2024-08-11 DIAGNOSIS — E7800 Pure hypercholesterolemia, unspecified: Secondary | ICD-10-CM | POA: Diagnosis not present

## 2024-08-11 DIAGNOSIS — D649 Anemia, unspecified: Secondary | ICD-10-CM

## 2024-08-11 DIAGNOSIS — R300 Dysuria: Secondary | ICD-10-CM | POA: Diagnosis not present

## 2024-08-11 NOTE — Addendum Note (Signed)
 Addended by: MARYLEN PRO A on: 08/11/2024 10:26 AM   Modules accepted: Orders

## 2024-08-11 NOTE — Addendum Note (Signed)
 Addended by: MARYLEN PRO A on: 08/11/2024 10:25 AM   Modules accepted: Orders

## 2024-08-12 LAB — CBC WITH DIFFERENTIAL/PLATELET
Absolute Lymphocytes: 1276 {cells}/uL (ref 850–3900)
Absolute Monocytes: 592 {cells}/uL (ref 200–950)
Basophils Absolute: 41 {cells}/uL (ref 0–200)
Basophils Relative: 0.7 %
Eosinophils Absolute: 29 {cells}/uL (ref 15–500)
Eosinophils Relative: 0.5 %
HCT: 35.9 % (ref 35.0–45.0)
Hemoglobin: 11.9 g/dL (ref 11.7–15.5)
MCH: 29.7 pg (ref 27.0–33.0)
MCHC: 33.1 g/dL (ref 32.0–36.0)
MCV: 89.5 fL (ref 80.0–100.0)
MPV: 11.3 fL (ref 7.5–12.5)
Monocytes Relative: 10.2 %
Neutro Abs: 3863 {cells}/uL (ref 1500–7800)
Neutrophils Relative %: 66.6 %
Platelets: 191 Thousand/uL (ref 140–400)
RBC: 4.01 Million/uL (ref 3.80–5.10)
RDW: 13.5 % (ref 11.0–15.0)
Total Lymphocyte: 22 %
WBC: 5.8 Thousand/uL (ref 3.8–10.8)

## 2024-08-12 LAB — HEPATIC FUNCTION PANEL
AG Ratio: 2.1 (calc) (ref 1.0–2.5)
ALT: 8 U/L (ref 6–29)
AST: 13 U/L (ref 10–35)
Albumin: 4.1 g/dL (ref 3.6–5.1)
Alkaline phosphatase (APISO): 54 U/L (ref 37–153)
Bilirubin, Direct: 0.2 mg/dL (ref 0.0–0.2)
Globulin: 2 g/dL (ref 1.9–3.7)
Indirect Bilirubin: 0.6 mg/dL (ref 0.2–1.2)
Total Bilirubin: 0.8 mg/dL (ref 0.2–1.2)
Total Protein: 6.1 g/dL (ref 6.1–8.1)

## 2024-08-12 LAB — LIPID PANEL
Cholesterol: 172 mg/dL (ref <200)
HDL: 70 mg/dL (ref >=50)
LDL Cholesterol (Calc): 87 mg/dL
Non-HDL Cholesterol (Calc): 102 mg/dL (ref <130)
Total CHOL/HDL Ratio: 2.5 (calc) (ref <5.0)
Triglycerides: 66 mg/dL (ref <150)

## 2024-08-12 LAB — BASIC METABOLIC PANEL WITH GFR
BUN: 9 mg/dL (ref 7–25)
CO2: 25 mmol/L (ref 20–32)
Calcium: 9.2 mg/dL (ref 8.6–10.4)
Chloride: 102 mmol/L (ref 98–110)
Creat: 0.95 mg/dL (ref 0.60–0.95)
Glucose, Bld: 109 mg/dL — ABNORMAL HIGH (ref 65–99)
Potassium: 4.3 mmol/L (ref 3.5–5.3)
Sodium: 137 mmol/L (ref 135–146)
eGFR: 59 mL/min/1.73m2 — ABNORMAL LOW (ref >=60)

## 2024-08-12 LAB — URINALYSIS, ROUTINE W REFLEX MICROSCOPIC
Bacteria, UA: NONE SEEN /HPF
Bilirubin Urine: NEGATIVE
Glucose, UA: NEGATIVE
Hgb urine dipstick: NEGATIVE
Hyaline Cast: NONE SEEN /LPF
Ketones, ur: NEGATIVE
Nitrite: NEGATIVE
Protein, ur: NEGATIVE
RBC / HPF: NONE SEEN /HPF (ref 0–2)
Specific Gravity, Urine: 1.005 (ref 1.001–1.035)
Squamous Epithelial / HPF: NONE SEEN /HPF (ref <=5)
WBC, UA: NONE SEEN /HPF (ref 0–5)
pH: 7.5 (ref 5.0–8.0)

## 2024-08-12 LAB — URINE CULTURE
MICRO NUMBER:: 17231321
SPECIMEN QUALITY:: ADEQUATE

## 2024-08-12 LAB — HEMOGLOBIN A1C
Hgb A1c MFr Bld: 5.7 % — ABNORMAL HIGH (ref <5.7)
Mean Plasma Glucose: 117 mg/dL
eAG (mmol/L): 6.5 mmol/L

## 2024-08-12 LAB — MICROSCOPIC MESSAGE

## 2024-08-12 LAB — EXTRA URINE SPECIMEN

## 2024-08-15 ENCOUNTER — Encounter: Payer: Self-pay | Admitting: Internal Medicine

## 2024-08-16 ENCOUNTER — Ambulatory Visit: Admitting: Internal Medicine

## 2024-08-16 VITALS — BP 120/60 | HR 65 | Temp 97.9°F | Ht 64.0 in | Wt 155.4 lb

## 2024-08-16 DIAGNOSIS — R739 Hyperglycemia, unspecified: Secondary | ICD-10-CM

## 2024-08-16 DIAGNOSIS — N939 Abnormal uterine and vaginal bleeding, unspecified: Secondary | ICD-10-CM

## 2024-08-16 DIAGNOSIS — K219 Gastro-esophageal reflux disease without esophagitis: Secondary | ICD-10-CM | POA: Diagnosis not present

## 2024-08-16 DIAGNOSIS — F439 Reaction to severe stress, unspecified: Secondary | ICD-10-CM | POA: Diagnosis not present

## 2024-08-16 DIAGNOSIS — M810 Age-related osteoporosis without current pathological fracture: Secondary | ICD-10-CM | POA: Diagnosis not present

## 2024-08-16 DIAGNOSIS — E7800 Pure hypercholesterolemia, unspecified: Secondary | ICD-10-CM

## 2024-08-16 DIAGNOSIS — I480 Paroxysmal atrial fibrillation: Secondary | ICD-10-CM | POA: Diagnosis not present

## 2024-08-16 DIAGNOSIS — N3010 Interstitial cystitis (chronic) without hematuria: Secondary | ICD-10-CM

## 2024-08-16 DIAGNOSIS — I10 Essential (primary) hypertension: Secondary | ICD-10-CM | POA: Diagnosis not present

## 2024-08-16 DIAGNOSIS — D649 Anemia, unspecified: Secondary | ICD-10-CM

## 2024-08-16 DIAGNOSIS — E871 Hypo-osmolality and hyponatremia: Secondary | ICD-10-CM

## 2024-08-16 NOTE — Telephone Encounter (Signed)
 Printed - place with appt. See if can connect with her during appt.

## 2024-08-16 NOTE — Progress Notes (Signed)
 Subjective:    [name], female    DOB: January 03, 1941, 83 y.o.   MRN: [id number]  Patient here for a scheduled follow up.   HPI Here for a scheduled follow up - follow up regarding hypertension. Saw urology 07/14/24 - f/u [facility] - recommended trospium  IR 20mg . They discussed genitourinary syndrome of menopause.reports she may get up 4-5x/night.  Saw pulmonary 06/28/24 - recommended to continue atrovent  nasal spray and zyrtec - persistent cough due to allergic rhinitis and upper airway cough syndrome with mild asthma. Recommended chest CT. No chest congestion. No increased cough. No increased sob reported. She does report noticing some vaginal bleeding/spotting - two weeks ago. No bleeding since. Some loose stool this am. No persistent diarrhea.    Past Medical History:  Diagnosis Date   AK (actinic keratosis) 11/12/2022   left upper arm, tx'd with EDC   AK (actinic keratosis) 11/12/2022   right medial pretibia, LN2 11/25/22   Basal cell carcinoma 06/21/2020   left post shoulder sup, left mid chest, left lat thigh   Basal cell carcinoma 11/07/2021   L upper back, EDC   Basal cell carcinoma 11/07/2021   R upper back, EDC   Basal cell carcinoma 11/27/2021   right upper arm, EDC   Basal cell carcinoma 11/27/2021   right shoulder posterior, EDC   Basal cell carcinoma 11/27/2021   right anterior shoulder, EDC   Basal cell carcinoma 03/09/2024   posterior neck, EDC   Basal cell carcinoma (BCC) 06/13/2021   left dorsal foot, EDC 10/02/2021   Basal cell carcinoma (BCC) 06/13/2021   right postauricular tx'd w/ [facility]   BCC (basal cell carcinoma of skin) 03/30/2007   R inf med pretibial - BCC   BCC (basal cell carcinoma of skin) 10/12/2013   L nasal ala - BCC   BCC (basal cell carcinoma of skin) 03/10/2018   L mid dorsum med forearm - superficial BCC    BCC (basal cell carcinoma) 10/02/2021   left dorsal foot proximal, EDC 10/02/2021   BCC (basal cell carcinoma) 10/02/2021    left mid back, superficial EDC 11/07/21   BCC (basal cell carcinoma) 10/02/2021   right pretibia   BCC (basal cell carcinoma) 10/02/2021   right mid back, superficial EDC 11/07/21   BCC (basal cell carcinoma) 08/14/2022   left distal calf, tx'd with EDC   BCC (basal cell carcinoma) 11/12/2022   superficial at left lateral pretibial,l tx'd with EDC   BCC (basal cell carcinoma) 11/12/2022   superficial at mid back right of midline, scheduled for EDC   BCC (basal cell carcinoma) 11/12/2022   mid back right of midline, [facility] 11/25/22   Breast screening, unspecified 2013   Cancer (HCC) 1992   skin   Coronary artery calcification seen on CT scan    Degenerative disk disease    Diastolic dysfunction    a. 10/2021 Echo: EF 60-65%, no rwma, GrI DD, mild MR; b. 01/2023 Echo: EF 65-70%, no rwma, mild LVH, GrI DD Nl RV fxn. Triv MR. Mild-mod TR. AoV sclerosis.   GERD (gastroesophageal reflux disease)    History of basal cell carcinoma (BCC) 08/29/2020    left inferior knee lateral, , left inferior knee medial, right pretibia   History of SCC (squamous cell carcinoma) of skin 08/29/2020   right posterior calf, left lateral calf, and    Hypercholesterolemia 2008   Interstitial cystitis    followed by Dr Ike   Near syncope  Other sign and symptom in breast 2013   Left upper outer quadrant breast soreness Ultrasound exam of right breast in the 2 o'clock position with the breast distracted medially showed a 0.3-0.4 with 0.5 cm simple cyst. In the 1 o'clock position where pt reported tenderness US  exam was negatiive.  Because of her history of intermittent nipple drainage, ultrasound was completed of the retroareolar area.   PAF (paroxysmal atrial fibrillation) (HCC)    a. dx 01/2023-->Eliquis  5 BID (CHA2DS2VASc = 5).   Personal history of tobacco use, presenting hazards to health    PONV (postoperative nausea and vomiting)    Primary hypertension    SCC (squamous cell carcinoma) 03/28/2008   R dorsum  hand - SCC   SCC (squamous cell carcinoma) 05/09/2009   R lat lower leg - SCCIS   SCC (squamous cell carcinoma) 05/09/2009   L lat lower leg - SCCIS   SCC (squamous cell carcinoma) 04/07/2013   L lower leg - SCCIS   SCC (squamous cell carcinoma) 04/27/2013   R forearm - SCC   SCC (squamous cell carcinoma) 04/28/2017   L lat knee - SCCIS   SCC (squamous cell carcinoma) 05/12/2018   L prox dorsum forearm - SCC   SCC (squamous cell carcinoma) 06/13/2021   left forearm tx'd w/ EDC   SCC (squamous cell carcinoma), leg, right 03/30/2007   R sup pretibial - SCCIS   SCC (squamous cell carcinoma);BCC 10/12/2013   Mid back - superficial BCC with SCCIS    Special screening for malignant neoplasms, colon    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   left dorsal forearm, left lat calf   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   left lower leg superior, EDC at follow up   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   chest right of midline, tx'd with [facility]   Past Surgical History:  Procedure Laterality Date   ABDOMINAL HYSTERECTOMY  1992   APPENDECTOMY     CERVICAL DISCECTOMY     S/P C7-T1 discectomy with fusion   CHOLECYSTECTOMY  1990   COLONOSCOPY WITH PROPOFOL  N/A 02/14/2016   Procedure: COLONOSCOPY WITH PROPOFOL ;  Surgeon: Rogelia Copping, MD;  Location: [facility];  Service: Endoscopy;  Laterality: N/A;  PT WOULD LIKE 10 ARRIVAL TIME OR LATER   DILATION AND CURETTAGE OF UTERUS     ESOPHAGOGASTRODUODENOSCOPY (EGD) WITH PROPOFOL  N/A 02/14/2016   Procedure: ESOPHAGOGASTRODUODENOSCOPY (EGD) WITH PROPOFOL  with dialtion;  Surgeon: Rogelia Copping, MD;  Location: [facility];  Service: Endoscopy;  Laterality: N/A;   ESOPHAGOGASTRODUODENOSCOPY (EGD) WITH PROPOFOL  N/A 04/13/2019   Procedure: ESOPHAGOGASTRODUODENOSCOPY (EGD) WITH PROPOFOL ;  Surgeon: Toledo, Ladell POUR, MD;  Location: [facility];  Service: Gastroenterology;  Laterality: N/A;   HIP PINNING,CANNULATED  Left 02/02/2023   Procedure: PERCUTANEOUS FIXATION OF FEMORAL NECK;  Surgeon: Marchia Drivers, MD;  Location: [facility];  Service: Orthopedics;  Laterality: Left;   MELANOMA EXCISION     removed from Left calf 1994   Family History  Problem Relation Age of Onset   Hodgkin's lymphoma Mother    Heart failure Father    Heart attack Father    Arthritis Sister        Three sisters w/ degeneratve disk disease   Headache Sister        Two sisters hx of headache   Breast cancer Neg Hx    Colon cancer Neg Hx    Bladder Cancer Neg Hx    Kidney cancer Neg Hx  Social History   Socioeconomic History   Marital status: Widowed    Spouse name: Not on file   Number of children: 2   Years of education: Not on file   Highest education level: Not on file  Occupational History   Not on file  Tobacco Use   Smoking status: Former    Current packs/day: 1.00    Average packs/day: 1 pack/day for 15.0 years (15.0 ttl pk-yrs)    Types: Cigarettes   Smokeless tobacco: Never  Vaping Use   Vaping status: Never Used  Substance and Sexual Activity   Alcohol use: No    Alcohol/week: 0.0 standard drinks of alcohol   Drug use: No   Sexual activity: Not on file  Other Topics Concern   Not on file  Social History Narrative   Married and has 2 children, daughters.   Social Drivers of Corporate Investment Banker Strain: Not on file  Food Insecurity: No Food Insecurity (02/11/2023)   Hunger Vital Sign    Worried About Running Out of Food in the Last Year: Never true    Ran Out of Food in the Last Year: Never true  Transportation Needs: No Transportation Needs (02/11/2023)   PRAPARE - Administrator, Civil Service (Medical): No    Lack of Transportation (Non-Medical): No  Physical Activity: Not on file  Stress: Not on file  Social Connections: Not on file     Review of Systems  Constitutional:  Negative for appetite change and unexpected weight change.  HENT:  Negative for congestion  and sinus pressure.   Respiratory:  Negative for cough, chest tightness and shortness of breath.   Cardiovascular:  Negative for chest pain, palpitations and leg swelling.  Gastrointestinal:  Negative for nausea and vomiting.       Some loose stool this am. No persistent diarrhea.   Genitourinary:  Negative for difficulty urinating and dysuria.  Musculoskeletal:  Negative for joint swelling and myalgias.  Skin:  Negative for color change and rash.  Neurological:  Negative for dizziness and headaches.  Psychiatric/Behavioral:  Negative for agitation and dysphoric mood.        Objective:     BP 120/60   Pulse 65   Temp 97.9 F (36.6 C) (Oral)   Ht 5' 4 (1.626 m)   Wt 155 lb 6 oz (70.5 kg)   LMP 11/20/1976   SpO2 99%   BMI 26.67 kg/m  Wt Readings from Last 3 Encounters:  08/16/24 155 lb 6 oz (70.5 kg)  07/14/24 148 lb (67.1 kg)  05/11/24 148 lb (67.1 kg)    Physical Exam Vitals reviewed.  Constitutional:      General: She is not in acute distress.    Appearance: Normal appearance.  HENT:     Head: Normocephalic and atraumatic.     Right Ear: External ear normal.     Left Ear: External ear normal.     Mouth/Throat:     Pharynx: No oropharyngeal exudate or posterior oropharyngeal erythema.  Eyes:     General: No scleral icterus.       Right eye: No discharge.        Left eye: No discharge.     Conjunctiva/sclera: Conjunctivae normal.  Neck:     Thyroid : No thyromegaly.  Cardiovascular:     Rate and Rhythm: Normal rate and regular rhythm.  Pulmonary:     Effort: No respiratory distress.     Breath sounds: Normal breath sounds.  No wheezing.  Abdominal:     General: Bowel sounds are normal.     Palpations: Abdomen is soft.     Tenderness: There is no abdominal tenderness.  Genitourinary:    Comments: Normal external genitalia.  Vaginal vault without lesions.  No bleeding. Atrophy changes. Could not appreciate any adnexal masses or tenderness.   Musculoskeletal:         General: No swelling or tenderness.     Cervical back: Neck supple. No tenderness.  Lymphadenopathy:     Cervical: No cervical adenopathy.  Skin:    Findings: No erythema or rash.  Neurological:     Mental Status: She is alert.  Psychiatric:        Mood and Affect: Mood normal.        Behavior: Behavior normal.         Outpatient Encounter Medications as of 08/16/2024  Medication Sig   acetaminophen  (TYLENOL ) 325 MG tablet Take 650 mg by mouth as needed.   apixaban  (ELIQUIS ) 5 MG TABS tablet Take 1 tablet by mouth twice daily   carvedilol  (COREG ) 25 MG tablet TAKE 1 TABLET BY MOUTH TWICE DAILY WITH A MEAL   conjugated estrogens  (PREMARIN ) vaginal cream Place 1 Applicatorful vaginally daily. Apply 0.5mg  (pea-sized amount)  just inside the vaginal introitus with a finger-tip on  Monday, Wednesday and Friday nights.   esomeprazole  (NEXIUM ) 40 MG capsule Take 1 capsule (40 mg total) by mouth daily.   hydrALAZINE  (APRESOLINE ) 25 MG tablet Take 1 tablet (25 mg total) by mouth 2 (two) times daily.   imipramine  (TOFRANIL ) 10 MG tablet Take 10 mg by mouth at bedtime.   ipratropium (ATROVENT ) 0.03 % nasal spray Place 2 sprays into both nostrils every 12 (twelve) hours.   nystatin  cream (MYCOSTATIN ) Apply 1 Application topically 2 (two) times daily.   trospium  (SANCTURA ) 20 MG tablet Take 1 tablet by mouth once daily   lovastatin  (MEVACOR ) 20 MG tablet Take 1 tablet (20 mg total) by mouth at bedtime.   sertraline  (ZOLOFT ) 50 MG tablet Take 1 tablet (50 mg total) by mouth daily.   [DISCONTINUED] lovastatin  (MEVACOR ) 20 MG tablet Take 1 tablet (20 mg total) by mouth at bedtime.   [DISCONTINUED] sertraline  (ZOLOFT ) 50 MG tablet Take 1 tablet (50 mg total) by mouth daily.   No facility-administered encounter medications on file as of 08/16/2024.     Lab Results  Component Value Date   WBC 5.8 08/11/2024   HGB 11.9 08/11/2024   HCT 35.9 08/11/2024   PLT 191 08/11/2024   GLUCOSE 109  (H) 08/11/2024   CHOL 172 08/11/2024   TRIG 66 08/11/2024   HDL 70 08/11/2024   LDLCALC 87 08/11/2024   ALT 8 08/11/2024   AST 13 08/11/2024   NA 137 08/11/2024   K 4.3 08/11/2024   CL 102 08/11/2024   CREATININE 0.95 08/11/2024   BUN 9 08/11/2024   CO2 25 08/11/2024   TSH 2.97 11/12/2023   HGBA1C 5.7 (H) 08/11/2024       Assessment & Plan:  Stress Assessment & Plan: Continues zoloft . Stable.    Anemia, unspecified type Assessment & Plan: Follow cbc.   Orders: -     CBC with Differential/Platelet; Future -     Basic metabolic panel with GFR; Future  Hypercholesterolemia Assessment & Plan: Continue mevacor . Low cholesterol diet and exercise. Follow lipid panel.  Lab Results  Component Value Date   CHOL 172 08/11/2024   HDL 70 08/11/2024  LDLCALC 87 08/11/2024   TRIG 66 08/11/2024   CHOLHDL 2.5 08/11/2024     Orders: -     Lipid panel; Future -     Hepatic function panel; Future  Hyperglycemia Assessment & Plan: Follow met b and A1c.  Lab Results  Component Value Date   HGBA1C 5.7 (H) 08/11/2024     Orders: -     Hemoglobin A1c; Future  PAF (paroxysmal atrial fibrillation) (HCC) Assessment & Plan: Continues on coreg  and eliquis . Appears to be in SR. Follow.    Osteoporosis, unspecified osteoporosis type, unspecified pathological fracture presence Assessment & Plan: Discussed bone density results and treatment options.  Discussed reclast and prolia.  Given her GI history, wants to avoid oral bisphosphonates.  She will notify me if desires to pursue further treatment. Continue calcium  and vitamin D and weight bearing exercise.    Hyponatremia Assessment & Plan: Sodium wnl 08/11/24.    Gastroesophageal reflux disease, unspecified whether esophagitis present Assessment & Plan: Continues on nexium . No upper symptoms reported.    Essential hypertension Assessment & Plan: On hydralazine  and coreg . Pressure as outlined. No changes in medication.  Follow pressures. Follow metabolic panel.    Chronic interstitial cystitis Assessment & Plan: Followed by urology.    Vaginal bleeding Assessment & Plan: Noticed vaginal bleeding two weeks ago. Is s/p hysterectomy. No lesions and no bleeding noticed on exam. Some atrophy changes. Denies hematuria. Denies rectal bleeding. Discussed gyn evaluation. She wants to hold at this time. Will notify me if she changes her mind or if she has any further bleeding.    Other orders -     Lovastatin ; Take 1 tablet (20 mg total) by mouth at bedtime.  Dispense: 90 tablet; Refill: 1 -     Sertraline  HCl; Take 1 tablet (50 mg total) by mouth daily.  Dispense: 90 tablet; Refill: 1     Allena Hamilton, MD

## 2024-08-22 ENCOUNTER — Encounter: Payer: Self-pay | Admitting: Internal Medicine

## 2024-08-23 ENCOUNTER — Encounter: Payer: Self-pay | Admitting: Internal Medicine

## 2024-08-23 DIAGNOSIS — N939 Abnormal uterine and vaginal bleeding, unspecified: Secondary | ICD-10-CM | POA: Insufficient documentation

## 2024-08-23 MED ORDER — SERTRALINE HCL 50 MG PO TABS: 50.0000 mg | ORAL_TABLET | Freq: Every day | ORAL | 1 refills | Status: AC

## 2024-08-23 MED ORDER — LOVASTATIN 20 MG PO TABS: 20.0000 mg | ORAL_TABLET | Freq: Every day | ORAL | 1 refills | Status: AC

## 2024-08-23 NOTE — Assessment & Plan Note (Signed)
 Discussed bone density results and treatment options.  Discussed reclast and prolia.  Given her GI history, wants to avoid oral bisphosphonates.  She will notify me if desires to pursue further treatment. Continue calcium  and vitamin D and weight bearing exercise.

## 2024-08-23 NOTE — Assessment & Plan Note (Signed)
 Followed by urology.

## 2024-08-23 NOTE — Assessment & Plan Note (Signed)
 On hydralazine and coreg. Pressure as outlined. No changes in medication. Follow pressures. Follow metabolic panel.

## 2024-08-23 NOTE — Assessment & Plan Note (Signed)
 Continues on coreg  and eliquis . Appears to be in SR. Follow.

## 2024-08-23 NOTE — Assessment & Plan Note (Signed)
 Continue mevacor . Low cholesterol diet and exercise. Follow lipid panel.  Lab Results  Component Value Date   CHOL 172 08/11/2024   HDL 70 08/11/2024   LDLCALC 87 08/11/2024   TRIG 66 08/11/2024   CHOLHDL 2.5 08/11/2024

## 2024-08-23 NOTE — Assessment & Plan Note (Signed)
Continues on nexium.  No upper symptoms reported.

## 2024-08-23 NOTE — Assessment & Plan Note (Signed)
 Noticed vaginal bleeding two weeks ago. Is s/p hysterectomy. No lesions and no bleeding noticed on exam. Some atrophy changes. Denies hematuria. Denies rectal bleeding. Discussed gyn evaluation. She wants to hold at this time. Will notify me if she changes her mind or if she has any further bleeding.

## 2024-08-23 NOTE — Assessment & Plan Note (Signed)
 Follow met b and A1c.  Lab Results  Component Value Date   HGBA1C 5.7 (H) 08/11/2024

## 2024-08-23 NOTE — Assessment & Plan Note (Signed)
 Follow cbc.

## 2024-08-23 NOTE — Assessment & Plan Note (Signed)
 Continues zoloft . Stable.

## 2024-08-23 NOTE — Assessment & Plan Note (Signed)
 Sodium wnl 08/11/24.

## 2024-08-30 ENCOUNTER — Ambulatory Visit: Admitting: *Deleted

## 2024-08-30 ENCOUNTER — Other Ambulatory Visit: Payer: Self-pay | Admitting: Urology

## 2024-08-30 ENCOUNTER — Telehealth: Payer: Self-pay | Admitting: *Deleted

## 2024-08-30 VITALS — Ht 64.0 in | Wt 156.0 lb

## 2024-08-30 DIAGNOSIS — Z0000 Encounter for general adult medical examination without abnormal findings: Secondary | ICD-10-CM | POA: Diagnosis not present

## 2024-08-30 DIAGNOSIS — N3281 Overactive bladder: Secondary | ICD-10-CM

## 2024-08-30 NOTE — Progress Notes (Signed)
 Chief Complaint  Patient presents with   Medicare Wellness     Subjective:   Rachael Austin is a 83 y.o. female who presents for a Medicare Annual Wellness Visit.  Visit info / Clinical Intake: Medicare Wellness Visit Type:: Subsequent Annual Wellness Visit Persons participating in visit and providing information:: patient Medicare Wellness Visit Mode:: Telephone If telephone:: video declined Since this visit was completed virtually, some vitals may be partially provided or unavailable. Missing vitals are due to the limitations of the virtual format.: Unable to obtain vitals - no equipment If Telephone or Video please confirm:: I connected with patient using audio/video enable telemedicine. I verified patient identity with two identifiers, discussed telehealth limitations, and patient agreed to proceed. Patient Location:: Office Provider Location:: Office/Home Interpreter Needed?: No Pre-visit prep was completed: yes AWV questionnaire completed by patient prior to visit?: no Living arrangements:: (!) lives alone Patient's Overall Health Status Rating: (!) fair Typical amount of pain: (!) a lot Does pain affect daily life?: (!) yes Are you currently prescribed opioids?: no  Dietary Habits and Nutritional Risks How many meals a day?: 2 Eats fruit and vegetables daily?: (!) no Most meals are obtained by: having others provide food (sister) In the last 2 weeks, have you had any of the following?: (!) nausea, vomiting, diarrhea (diarrhea last night and today) Diabetic:: no  Functional Status Activities of Daily Living (to include ambulation/medication): Independent Ambulation: Independent with device- listed below Home Assistive Devices/Equipment: [place] (some times) Medication Administration: Independent Home Management (perform basic housework or laundry): Independent Manage your own finances?: yes Primary transportation is: driving Concerns about vision?: (!) yes (glasses  need to be changed) Concerns about hearing?: (!) yes Uses hearing aids?: (!) yes Hear whispered voice?: -- (televisit)  Fall Screening Falls in the past year?: 0 Number of falls in past year: 0 Was there an injury with Fall?: 0 Fall Risk Category Calculator: 0 Patient Fall Risk Level: Low Fall Risk  Fall Risk Patient at Risk for Falls Due to: No Fall Risks Fall risk Follow up: Falls evaluation completed; Falls prevention discussed  Home and Transportation Safety: All rugs have non-skid backing?: yes All stairs or steps have railings?: yes Grab bars in the bathtub or shower?: yes Have non-skid surface in bathtub or shower?: yes Good home lighting?: yes Regular seat belt use?: yes Hospital stays in the last year:: no  Cognitive Assessment Difficulty concentrating, remembering, or making decisions? : yes Will 6CIT or Mini Cog be Completed: yes What year is it?: 0 points What month is it?: 0 points Give patient an address phrase to remember (5 components): [address] [place] About what time is it?: 0 points Count backwards from 20 to 1: 0 points Say the months of the year in reverse: 0 points Repeat the address phrase from earlier: 2 points 6 CIT Score: 2 points  Advance Directives (For Healthcare) Does Patient Have a Medical Advance Directive?: No Would patient like information on creating a medical advance directive?: No - Patient declined  Reviewed/Updated  Reviewed/Updated: Reviewed All (Medical, Surgical, Family, Medications, Allergies, Care Teams, Patient Goals)    Allergies (verified) Augmentin [amoxicillin-pot clavulanate], Lexapro  [escitalopram  oxalate], Micardis  [telmisartan ], Myrbetriq [mirabegron], Niacin and related, Codeine sulfate, and Vesicare  [solifenacin  succinate]   Current Medications (verified) Outpatient Encounter Medications as of 08/30/2024  Medication Sig   acetaminophen  (TYLENOL ) 325 MG tablet Take 650 mg by mouth as needed.    apixaban  (ELIQUIS ) 5 MG TABS tablet Take 1 tablet by  mouth twice daily   carvedilol  (COREG ) 25 MG tablet TAKE 1 TABLET BY MOUTH TWICE DAILY WITH A MEAL   Cholecalciferol (VITAMIN D) 125 MCG CAPS Take by mouth daily.   conjugated estrogens  (PREMARIN ) vaginal cream Place 1 Applicatorful vaginally daily. Apply 0.5mg  (pea-sized amount)  just inside the vaginal introitus with a finger-tip on  Monday, Wednesday and Friday nights.   cyanocobalamin  (VITAMIN B12) 1000 MCG tablet Take 1,000 mcg by mouth daily.   esomeprazole  (NEXIUM ) 40 MG capsule Take 1 capsule (40 mg total) by mouth daily.   hydrALAZINE  (APRESOLINE ) 25 MG tablet Take 1 tablet (25 mg total) by mouth 2 (two) times daily.   imipramine  (TOFRANIL ) 10 MG tablet Take 10 mg by mouth at bedtime.   ipratropium (ATROVENT ) 0.03 % nasal spray Place 2 sprays into both nostrils every 12 (twelve) hours.   lovastatin  (MEVACOR ) 20 MG tablet Take 1 tablet (20 mg total) by mouth at bedtime.   nystatin  cream (MYCOSTATIN ) Apply 1 Application topically 2 (two) times daily.   sertraline  (ZOLOFT ) 50 MG tablet Take 1 tablet (50 mg total) by mouth daily.   trospium  (SANCTURA ) 20 MG tablet Take 1 tablet by mouth once daily   No facility-administered encounter medications on file as of 08/30/2024.    History: Past Medical History:  Diagnosis Date   AK (actinic keratosis) 11/12/2022   left upper arm, tx'd with EDC   AK (actinic keratosis) 11/12/2022   right medial pretibia, LN2 11/25/22   Basal cell carcinoma 06/21/2020   left post shoulder sup, left mid chest, left lat thigh   Basal cell carcinoma 11/07/2021   L upper back, EDC   Basal cell carcinoma 11/07/2021   R upper back, EDC   Basal cell carcinoma 11/27/2021   right upper arm, EDC   Basal cell carcinoma 11/27/2021   right shoulder posterior, EDC   Basal cell carcinoma 11/27/2021   right anterior shoulder, EDC   Basal cell carcinoma 03/09/2024   posterior neck, EDC   Basal cell carcinoma (BCC)  06/13/2021   left dorsal foot, EDC 10/02/2021   Basal cell carcinoma (BCC) 06/13/2021   right postauricular tx'd w/ EDC   BCC (basal cell carcinoma of skin) 03/30/2007   R inf med pretibial - BCC   BCC (basal cell carcinoma of skin) 10/12/2013   L nasal ala - BCC   BCC (basal cell carcinoma of skin) 03/10/2018   L mid dorsum med forearm - superficial BCC    BCC (basal cell carcinoma) 10/02/2021   left dorsal foot proximal, EDC 10/02/2021   BCC (basal cell carcinoma) 10/02/2021   left mid back, superficial EDC 11/07/21   BCC (basal cell carcinoma) 10/02/2021   right pretibia   BCC (basal cell carcinoma) 10/02/2021   right mid back, superficial EDC 11/07/21   BCC (basal cell carcinoma) 08/14/2022   left distal calf, tx'd with EDC   BCC (basal cell carcinoma) 11/12/2022   superficial at left lateral pretibial,l tx'd with EDC   BCC (basal cell carcinoma) 11/12/2022   superficial at mid back right of midline, scheduled for EDC   BCC (basal cell carcinoma) 11/12/2022   mid back right of midline, [facility] 11/25/22   Breast screening, unspecified 2013   Cancer [facility]) 1992   skin   Coronary artery calcification seen on CT scan    Degenerative disk disease    Diastolic dysfunction    a. 10/2021 Echo: EF 60-65%, no rwma, GrI DD, mild MR; b. 01/2023 Echo: EF 65-70%,  no rwma, mild LVH, GrI DD Nl RV fxn. Triv MR. Mild-mod TR. AoV sclerosis.   GERD (gastroesophageal reflux disease)    History of basal cell carcinoma (BCC) 08/29/2020    left inferior knee lateral, , left inferior knee medial, right pretibia   History of SCC (squamous cell carcinoma) of skin 08/29/2020   right posterior calf, left lateral calf, and    Hypercholesterolemia 2008   Interstitial cystitis    followed by Dr Ike   Near syncope    Other sign and symptom in breast 2013   Left upper outer quadrant breast soreness Ultrasound exam of right breast in the 2 o'clock position with the breast distracted medially showed a 0.3-0.4 with 0.5  cm simple cyst. In the 1 o'clock position where pt reported tenderness US  exam was negatiive.  Because of her history of intermittent nipple drainage, ultrasound was completed of the retroareolar area.   PAF (paroxysmal atrial fibrillation) (HCC)    a. dx 01/2023-->Eliquis  5 BID (CHA2DS2VASc = 5).   Personal history of tobacco use, presenting hazards to health    PONV (postoperative nausea and vomiting)    Primary hypertension    SCC (squamous cell carcinoma) 03/28/2008   R dorsum hand - SCC   SCC (squamous cell carcinoma) 05/09/2009   R lat lower leg - SCCIS   SCC (squamous cell carcinoma) 05/09/2009   L lat lower leg - SCCIS   SCC (squamous cell carcinoma) 04/07/2013   L lower leg - SCCIS   SCC (squamous cell carcinoma) 04/27/2013   R forearm - SCC   SCC (squamous cell carcinoma) 04/28/2017   L lat knee - SCCIS   SCC (squamous cell carcinoma) 05/12/2018   L prox dorsum forearm - SCC   SCC (squamous cell carcinoma) 06/13/2021   left forearm tx'd w/ EDC   SCC (squamous cell carcinoma), leg, right 03/30/2007   R sup pretibial - SCCIS   SCC (squamous cell carcinoma);BCC 10/12/2013   Mid back - superficial BCC with SCCIS    Special screening for malignant neoplasms, colon    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   left dorsal forearm, left lat calf   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   left lower leg superior, EDC at follow up   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   chest right of midline, tx'd with [facility]   Past Surgical History:  Procedure Laterality Date   ABDOMINAL HYSTERECTOMY  1992   APPENDECTOMY     CERVICAL DISCECTOMY     S/P C7-T1 discectomy with fusion   CHOLECYSTECTOMY  1990   COLONOSCOPY WITH PROPOFOL  N/A 02/14/2016   Procedure: COLONOSCOPY WITH PROPOFOL ;  Surgeon: Rogelia Copping, MD;  Location: [facility];  Service: Endoscopy;  Laterality: N/A;  PT WOULD LIKE 10 ARRIVAL TIME OR LATER   DILATION AND CURETTAGE OF UTERUS      ESOPHAGOGASTRODUODENOSCOPY (EGD) WITH PROPOFOL  N/A 02/14/2016   Procedure: ESOPHAGOGASTRODUODENOSCOPY (EGD) WITH PROPOFOL  with dialtion;  Surgeon: Rogelia Copping, MD;  Location: [facility];  Service: Endoscopy;  Laterality: N/A;   ESOPHAGOGASTRODUODENOSCOPY (EGD) WITH PROPOFOL  N/A 04/13/2019   Procedure: ESOPHAGOGASTRODUODENOSCOPY (EGD) WITH PROPOFOL ;  Surgeon: Toledo, Ladell POUR, MD;  Location: [facility];  Service: Gastroenterology;  Laterality: N/A;   HIP PINNING,CANNULATED Left 02/02/2023   Procedure: PERCUTANEOUS FIXATION OF FEMORAL NECK;  Surgeon: Marchia Drivers, MD;  Location: [facility];  Service: Orthopedics;  Laterality: Left;   MELANOMA EXCISION     removed from Left calf 1994  Family History  Problem Relation Age of Onset   Hodgkin's lymphoma Mother    Heart failure Father    Heart attack Father    Arthritis Sister        Three sisters w/ degeneratve disk disease   Headache Sister        Two sisters hx of headache   Breast cancer Neg Hx    Colon cancer Neg Hx    Bladder Cancer Neg Hx    Kidney cancer Neg Hx    Social History   Occupational History   Not on file  Tobacco Use   Smoking status: Former    Current packs/day: 1.00    Average packs/day: 1 pack/day for 15.0 years (15.0 ttl pk-yrs)    Types: Cigarettes   Smokeless tobacco: Never  Vaping Use   Vaping status: Never Used  Substance and Sexual Activity   Alcohol use: No    Alcohol/week: 0.0 standard drinks of alcohol   Drug use: No   Sexual activity: Not on file   Tobacco Counseling Counseling given: Not Answered  SDOH Screenings   Food Insecurity: No Food Insecurity (08/30/2024)  Housing: Low Risk  (08/30/2024)  Transportation Needs: No Transportation Needs (08/30/2024)  Utilities: Not At Risk (08/30/2024)  Alcohol Screen: Low Risk  (08/30/2024)  Depression (PHQ2-9): Medium Risk (08/30/2024)  Financial Resource Strain: Low Risk  (08/30/2024)  Physical Activity: Inactive (08/30/2024)  Social  Connections: Moderately Integrated (08/30/2024)  Stress: No Stress Concern Present (08/30/2024)  Tobacco Use: Medium Risk (08/30/2024)  Health Literacy: Adequate Health Literacy (08/30/2024)   See flowsheets for full screening details  Depression Screen PHQ 2 & 9 Depression Scale- Over the past 2 weeks, how often have you been bothered by any of the following problems? Little interest or pleasure in doing things: 0 Feeling down, depressed, or hopeless (PHQ Adolescent also includes...irritable): 1 PHQ-2 Total Score: 1 Trouble falling or staying asleep, or sleeping too much: 2 Feeling tired or having little energy: 2 Poor appetite or overeating (PHQ Adolescent also includes...weight loss): 1 Feeling bad about yourself - or that you are a failure or have let yourself or your family down: 0 Trouble concentrating on things, such as reading the newspaper or watching television (PHQ Adolescent also includes...like school work): 0 Moving or speaking so slowly that other people could have noticed. Or the opposite - being so fidgety or restless that you have been moving around a lot more than usual: 0 Thoughts that you would be better off dead, or of hurting yourself in some way: 0 PHQ-9 Total Score: 6 If you checked off any problems, how difficult have these problems made it for you to do your work, take care of things at home, or get along with other people?: Somewhat difficult  Depression Treatment Depression Interventions/Treatment : Medication     Goals Addressed             This Visit's Progress    Patient Stated       Wants to get better and be able to walk better             Objective:    Today's Vitals   08/30/24 0945  Weight: 156 lb (70.8 kg)  Height: 5' 4 (1.626 m)   Body mass index is 26.78 kg/m.  Hearing/Vision screen Hearing Screening - Comments:: Wears aids, getting new ones tomorrow. Vision Screening - Comments:: Glasses, [facility], overdue, needs to call and  schedule an appointment Immunizations and Health Maintenance Health Maintenance  Topic Date Due   Zoster Vaccines- Shingrix (1 of 2) 01/11/1960   COVID-19 Vaccine (3 - Pfizer risk series) 12/21/2019   Pneumococcal Vaccine: 50+ Years (1 of 1 - PCV) 11/19/2024 (Originally 01/10/1990)   Influenza Vaccine  12/27/2024 (Originally 04/29/2024)   Mammogram  06/15/2025   Medicare Annual Wellness (AWV)  08/30/2025   Bone Density Scan  Completed   Meningococcal B Vaccine  Aged Out   DTaP/Tdap/Td  Discontinued        Assessment/Plan:  This is a routine wellness examination for [place].  Patient Care Team: Glendia Shad, MD as PCP - General (Internal Medicine) Perla Evalene PARAS, MD as PCP - Cardiology (Cardiology) Ike Redell RAMAN, MD (Unknown Physician Specialty) Helon Clotilda DELENA DEVONNA as Physician Assistant (Urology) Parris Manna, MD as Consulting Physician (Pulmonary Disease)  I have personally reviewed and noted the following in the patient's chart:   Medical and social history Use of alcohol, tobacco or illicit drugs  Current medications and supplements including opioid prescriptions. Functional ability and status Nutritional status Physical activity Advanced directives List of other physicians Hospitalizations, surgeries, and ER visits in previous 12 months Vitals Screenings to include cognitive, depression, and falls Referrals and appointments  No orders of the defined types were placed in this encounter.  In addition, I have reviewed and discussed with patient certain preventive protocols, quality metrics, and best practice recommendations. A written personalized care plan for preventive services as well as general preventive health recommendations were provided to patient.   Angeline Fredericks, LPN   [id number]   Return in 1 year (on 08/30/2025).  After Visit Summary: (MyChart) Due to this being a telephonic visit, the after visit summary with patients personalized plan was  offered to patient via MyChart   Nurse Notes: Patient declines shingles vaccines. Patient stated that she will get her flu vaccine at her pharmacy. Discussed the need to update tetanus vaccine. Phone note sent to PCP

## 2024-08-30 NOTE — Telephone Encounter (Signed)
 Please notify - it appears that she has a wellness visit with Angeline. I am going to forward this to [place] as well.

## 2024-08-30 NOTE — Patient Instructions (Signed)
 Ms. Friedly,  Thank you for taking the time for your Medicare Wellness Visit. I appreciate your continued commitment to your health goals. Please review the care plan we discussed, and feel free to reach out if I can assist you further.  Please note that Annual Wellness Visits do not include a physical exam. Some assessments may be limited, especially if the visit was conducted virtually. If needed, we may recommend an in-person follow-up with your provider.  Ongoing Care Seeing your primary care provider every 3 to 6 months helps us  monitor your health and provide consistent, personalized care.   Remember to update your flu and tetanus vaccines. Make sure that you call and schedule an eye appointment.  Consider getting your shingles vaccines.   Referrals If a referral was made during today's visit and you haven't received any updates within two weeks, please contact the referred provider directly to check on the status.  Recommended Screenings:  Health Maintenance  Topic Date Due   Zoster (Shingles) Vaccine (1 of 2) 01/11/1960   COVID-19 Vaccine (3 - Pfizer risk series) 12/21/2019   Pneumococcal Vaccine for age over 27 (1 of 1 - PCV) 11/19/2024*   Flu Shot  12/27/2024*   Breast Cancer Screening  06/15/2025   Medicare Annual Wellness Visit  08/30/2025   Osteoporosis screening with Bone Density Scan  Completed   Meningitis B Vaccine  Aged Out   DTaP/Tdap/Td vaccine  Discontinued  *Topic was postponed. The date shown is not the original due date.       08/30/2024    9:53 AM  Advanced Directives  Does Patient Have a Medical Advance Directive? No  Would patient like information on creating a medical advance directive? No - Patient declined    Vision: Annual vision screenings are recommended for early detection of glaucoma, cataracts, and diabetic retinopathy. These exams can also reveal signs of chronic conditions such as diabetes and high blood pressure.  Dental: Annual dental  screenings help detect early signs of oral cancer, gum disease, and other conditions linked to overall health, including heart disease and diabetes.  Please see the attached documents for additional preventive care recommendations.

## 2024-08-30 NOTE — Telephone Encounter (Signed)
 Performed AWV While on the phone patient complained of having some stomach pain last night. Patient stated she has felt bad since Sunday. Patient stated she started with diarrhea this morning and has had it twice and some stomach pain today. . Patient stated that her urine is cloudy and burns at times when she urinates. Patient denies a fever. Patient stated that she feels real weak . Patient declines an appointment today or tomorrow but requested that this message go back to Dr. Glendia. Patient stated that she has an appointment scheduled tomorrow to get her new hearing aids and her daughter is coming into town to go with her for that appointment.

## 2024-08-30 NOTE — Telephone Encounter (Signed)
 If she is having diarrhea, urinary symptoms and abdominal pain - needs to be evaluated. Note states she refused appt today and tomorrow. Needs to be evaluated.

## 2024-08-31 DIAGNOSIS — H903 Sensorineural hearing loss, bilateral: Secondary | ICD-10-CM | POA: Diagnosis not present

## 2024-09-01 ENCOUNTER — Ambulatory Visit: Admitting: Urology

## 2024-09-01 ENCOUNTER — Ambulatory Visit: Payer: Self-pay

## 2024-09-01 ENCOUNTER — Encounter: Payer: Self-pay | Admitting: Urology

## 2024-09-01 VITALS — BP 138/74 | HR 68 | Wt 150.0 lb

## 2024-09-01 DIAGNOSIS — R3129 Other microscopic hematuria: Secondary | ICD-10-CM | POA: Diagnosis not present

## 2024-09-01 DIAGNOSIS — R3989 Other symptoms and signs involving the genitourinary system: Secondary | ICD-10-CM

## 2024-09-01 DIAGNOSIS — N958 Other specified menopausal and perimenopausal disorders: Secondary | ICD-10-CM | POA: Diagnosis not present

## 2024-09-01 LAB — URINALYSIS, COMPLETE
Bilirubin, UA: NEGATIVE
Glucose, UA: NEGATIVE
Ketones, UA: NEGATIVE
Nitrite, UA: POSITIVE — AB
Protein,UA: NEGATIVE
Specific Gravity, UA: <1.005 — ABNORMAL LOW (ref 1.005–1.030)
Urobilinogen, Ur: 0.2 mg/dL (ref 0.2–1.0)
pH, UA: 5.5 (ref 5.0–7.5)

## 2024-09-01 LAB — MICROSCOPIC EXAMINATION: WBC, UA: >30 /HPF — AB (ref 0–5)

## 2024-09-01 MED ORDER — NITROFURANTOIN MONOHYD MACRO 100 MG PO CAPS: 100.0000 mg | ORAL_CAPSULE | Freq: Two times a day (BID) | ORAL | 0 refills | Status: DC

## 2024-09-01 NOTE — Telephone Encounter (Signed)
 FYI Only or Action Required?: Action required by provider: request for appointment. Request work in today for suspected UTI. Refused urgent care due to wait.   Patient was last seen in primary care on 08/16/2024 by Glendia Shad, MD.  Called Nurse Triage reporting Dysuria.  Symptoms began several days ago.  Interventions attempted: Rest, hydration, or home remedies.  Symptoms are: gradually worsening.  Triage Disposition: See HCP Within 4 Hours (Or PCP Triage)  Patient/caregiver understands and will follow disposition?: Yes    Copied from CRM [id number]. Topic: Clinical - Red Word Triage >> Sep 01, 2024  8:04 AM Zy'onna H wrote: [facility] that prompted transfer to Nurse Triage: Patient is experiencing UTI symptoms - Urine Cloudy - Painful to urinate - Having a chill when urinating  Looking to schedule appt. Reason for Disposition  Side (flank) or lower back pain present  Answer Assessment - Initial Assessment Questions Additional info: No appointments are available in clinic today, refusing urgent care due to wait time and unable to drive in the evening. She is requesting work in visit with pcp or other provider in clinic today.  She also plans to call her urologist to see if they can fit her in today.  Please follow up with patient for requested appointment.     1. SYMPTOM: What's the main symptom you're concerned about? (e.g., frequency, incontinence)     dysuria 2. ONSET: When did the  dysuria start?     Few days ago worse today 3. PAIN: Is there any pain? If Yes, ask: How bad is it? (Scale: 1-10; mild, moderate, severe)     burning 4. CAUSE: What do you think is causing the symptoms?     UTI 5. OTHER SYMPTOMS: Do you have any other symptoms? (e.g., blood in urine, fever, flank pain, pain with urination)     Chills, cloudy 6. PREGNANCY: Is there any chance you are pregnant? When was your last menstrual period?  Protocols used: Urinary  Symptoms-A-AH

## 2024-09-01 NOTE — Telephone Encounter (Signed)
 Attempted to reach the patient on three different attempts to get her scheduled with any other practice with open availability. Unable to leave a message due to the line giving a busy signal each time. Will attempt to reach patient through MyChart message.

## 2024-09-01 NOTE — Telephone Encounter (Signed)
 Noted! Thank you

## 2024-09-01 NOTE — Progress Notes (Signed)
 09/01/2024 1:58 PM   Rachael Austin 08-20-1941 [id number]  Referring provider: Glendia Shad, MD [address] [place],  [place] [address]  Urological history: 1. OAB -Contributing factors of age, GSM, hypertension, detrusor dyssynergia, hyperglycemia, COVID, constipation, antihistamines and smoking history -trospium  IR 20 mg   2. IC  -contributing factors of age, GSM and constipation -Imipramine  10 mg daily -cannot take due to QT prolongation  Chief Complaint  Patient presents with   Urinary Tract Infection   HPI: Rachael Austin is a 83 y.o. woman who presents today for possible UTI.  Previous records reviewed.   She has heavy malodorous urine, chills going up when she voids and brown-colored urine.  Patient denies any modifying or aggravating factors.  Patient denies any recent UTI's, gross hematuria, dysuria or suprapubic/flank pain.  Patient denies any fevers, chills, nausea or vomiting.    Urinalysis is yellow slightly cloudy, specific gravity less than 1.005, pH 5.5, trace heme, nitrate positive, 2+ leukocyte, greater than 30 WBCs, 3-10 RBCs, 0-10 epithelial cells and many bacteria.    She admits that she does not use the vaginal estrogen cream as prescribed.     PMH: Past Medical History:  Diagnosis Date   AK (actinic keratosis) 11/12/2022   left upper arm, tx'd with EDC   AK (actinic keratosis) 11/12/2022   right medial pretibia, LN2 11/25/22   Basal cell carcinoma 06/21/2020   left post shoulder sup, left mid chest, left lat thigh   Basal cell carcinoma 11/07/2021   L upper back, EDC   Basal cell carcinoma 11/07/2021   R upper back, EDC   Basal cell carcinoma 11/27/2021   right upper arm, EDC   Basal cell carcinoma 11/27/2021   right shoulder posterior, EDC   Basal cell carcinoma 11/27/2021   right anterior shoulder, EDC   Basal cell carcinoma 03/09/2024   posterior neck, EDC   Basal cell carcinoma (BCC) 06/13/2021    left dorsal foot, EDC 10/02/2021   Basal cell carcinoma (BCC) 06/13/2021   right postauricular tx'd w/ EDC   BCC (basal cell carcinoma of skin) 03/30/2007   R inf med pretibial - BCC   BCC (basal cell carcinoma of skin) 10/12/2013   L nasal ala - BCC   BCC (basal cell carcinoma of skin) 03/10/2018   L mid dorsum med forearm - superficial BCC    BCC (basal cell carcinoma) 10/02/2021   left dorsal foot proximal, EDC 10/02/2021   BCC (basal cell carcinoma) 10/02/2021   left mid back, superficial EDC 11/07/21   BCC (basal cell carcinoma) 10/02/2021   right pretibia   BCC (basal cell carcinoma) 10/02/2021   right mid back, superficial EDC 11/07/21   BCC (basal cell carcinoma) 08/14/2022   left distal calf, tx'd with EDC   BCC (basal cell carcinoma) 11/12/2022   superficial at left lateral pretibial,l tx'd with EDC   BCC (basal cell carcinoma) 11/12/2022   superficial at mid back right of midline, scheduled for EDC   BCC (basal cell carcinoma) 11/12/2022   mid back right of midline, [facility] 11/25/22   Breast screening, unspecified 2013   Cancer (HCC) 1992   skin   Coronary artery calcification seen on CT scan    Degenerative disk disease    Diastolic dysfunction    a. 10/2021 Echo: EF 60-65%, no rwma, GrI DD, mild MR; b. 01/2023 Echo: EF 65-70%, no rwma, mild LVH, GrI DD Nl RV fxn. Triv MR. Mild-mod TR. AoV sclerosis.  GERD (gastroesophageal reflux disease)    History of basal cell carcinoma (BCC) 08/29/2020    left inferior knee lateral, , left inferior knee medial, right pretibia   History of SCC (squamous cell carcinoma) of skin 08/29/2020   right posterior calf, left lateral calf, and    Hypercholesterolemia 2008   Interstitial cystitis    followed by Dr Ike   Near syncope    Other sign and symptom in breast 2013   Left upper outer quadrant breast soreness Ultrasound exam of right breast in the 2 o'clock position with the breast distracted medially showed a 0.3-0.4 with 0.5 cm simple  cyst. In the 1 o'clock position where pt reported tenderness US  exam was negatiive.  Because of her history of intermittent nipple drainage, ultrasound was completed of the retroareolar area.   PAF (paroxysmal atrial fibrillation) (HCC)    a. dx 01/2023-->Eliquis  5 BID (CHA2DS2VASc = 5).   Personal history of tobacco use, presenting hazards to health    PONV (postoperative nausea and vomiting)    Primary hypertension    SCC (squamous cell carcinoma) 03/28/2008   R dorsum hand - SCC   SCC (squamous cell carcinoma) 05/09/2009   R lat lower leg - SCCIS   SCC (squamous cell carcinoma) 05/09/2009   L lat lower leg - SCCIS   SCC (squamous cell carcinoma) 04/07/2013   L lower leg - SCCIS   SCC (squamous cell carcinoma) 04/27/2013   R forearm - SCC   SCC (squamous cell carcinoma) 04/28/2017   L lat knee - SCCIS   SCC (squamous cell carcinoma) 05/12/2018   L prox dorsum forearm - SCC   SCC (squamous cell carcinoma) 06/13/2021   left forearm tx'd w/ EDC   SCC (squamous cell carcinoma), leg, right 03/30/2007   R sup pretibial - SCCIS   SCC (squamous cell carcinoma);BCC 10/12/2013   Mid back - superficial BCC with SCCIS    Special screening for malignant neoplasms, colon    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   left dorsal forearm, left lat calf   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   left lower leg superior, EDC at follow up   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   chest right of midline, tx'd with [facility]    Surgical History: Past Surgical History:  Procedure Laterality Date   ABDOMINAL HYSTERECTOMY  1992   APPENDECTOMY     CERVICAL DISCECTOMY     S/P C7-T1 discectomy with fusion   CHOLECYSTECTOMY  1990   COLONOSCOPY WITH PROPOFOL  N/A 02/14/2016   Procedure: COLONOSCOPY WITH PROPOFOL ;  Surgeon: Rogelia Copping, MD;  Location: [facility];  Service: Endoscopy;  Laterality: N/A;  PT WOULD LIKE 10 ARRIVAL TIME OR LATER   DILATION AND CURETTAGE OF  UTERUS     ESOPHAGOGASTRODUODENOSCOPY (EGD) WITH PROPOFOL  N/A 02/14/2016   Procedure: ESOPHAGOGASTRODUODENOSCOPY (EGD) WITH PROPOFOL  with dialtion;  Surgeon: Rogelia Copping, MD;  Location: [facility];  Service: Endoscopy;  Laterality: N/A;   ESOPHAGOGASTRODUODENOSCOPY (EGD) WITH PROPOFOL  N/A 04/13/2019   Procedure: ESOPHAGOGASTRODUODENOSCOPY (EGD) WITH PROPOFOL ;  Surgeon: Toledo, Ladell POUR, MD;  Location: [facility] END[facility];  Service: Gastroenterology;  Laterality: N/A;   HIP PINNING,CANNULATED Left 02/02/2023   Procedure: PERCUTANEOUS FIXATION OF FEMORAL NECK;  Surgeon: Marchia Drivers, MD;  Location: [facility] ORS;  Service: Orthopedics;  Laterality: Left;   MELANOMA EXCISION     removed from Left calf 1994    Home Medications:  Allergies as of 09/01/2024  Reactions   Augmentin [amoxicillin-pot Clavulanate] Swelling, Rash   Swelling of the lips   Lexapro  [escitalopram  Oxalate]    Micardis  [telmisartan ] Itching   Myrbetriq [mirabegron] Itching   Niacin And Related Itching   Codeine Sulfate Other (See Comments)   Makes her hyper   Vesicare  [solifenacin  Succinate] Nausea And Vomiting, Rash        Medication List        Accurate as of September 01, 2024  1:58 PM. If you have any questions, ask your nurse or doctor.          acetaminophen  325 MG tablet Commonly known as: TYLENOL  Take 650 mg by mouth as needed.   carvedilol  25 MG tablet Commonly known as: COREG  TAKE 1 TABLET BY MOUTH TWICE DAILY WITH A MEAL   cyanocobalamin  1000 MCG tablet Commonly known as: VITAMIN B12 Take 1,000 mcg by mouth daily.   Eliquis  5 MG Tabs tablet Generic drug: apixaban  Take 1 tablet by mouth twice daily   esomeprazole  40 MG capsule Commonly known as: NEXIUM  Take 1 capsule (40 mg total) by mouth daily.   hydrALAZINE  25 MG tablet Commonly known as: APRESOLINE  Take 1 tablet (25 mg total) by mouth 2 (two) times daily.   imipramine  10 MG tablet Commonly known as: TOFRANIL  Take 10 mg  by mouth at bedtime.   ipratropium 0.03 % nasal spray Commonly known as: ATROVENT  Place 2 sprays into both nostrils every 12 (twelve) hours.   lovastatin  20 MG tablet Commonly known as: MEVACOR  Take 1 tablet (20 mg total) by mouth at bedtime.   nystatin  cream Commonly known as: MYCOSTATIN  Apply 1 Application topically 2 (two) times daily.   Premarin  vaginal cream Generic drug: conjugated estrogens  Place 1 Applicatorful vaginally daily. Apply 0.5mg  (pea-sized amount)  just inside the vaginal introitus with a finger-tip on  Monday, Wednesday and Friday nights.   sertraline  50 MG tablet Commonly known as: ZOLOFT  Take 1 tablet (50 mg total) by mouth daily.   trospium  20 MG tablet Commonly known as: SANCTURA  Take 1 tablet by mouth once daily   Vitamin D 125 MCG Caps Take by mouth daily.        Allergies:  Allergies  Allergen Reactions   Augmentin [Amoxicillin-Pot Clavulanate] Swelling and Rash    Swelling of the lips   Lexapro  [Escitalopram  Oxalate]    Micardis  [Telmisartan ] Itching   Myrbetriq [Mirabegron] Itching   Niacin And Related Itching   Codeine Sulfate Other (See Comments)    Makes her hyper   Vesicare  [Solifenacin  Succinate] Nausea And Vomiting and Rash    Family History: Family History  Problem Relation Age of Onset   Hodgkin's lymphoma Mother    Heart failure Father    Heart attack Father    Arthritis Sister        Three sisters w/ degeneratve disk disease   Headache Sister        Two sisters hx of headache   Breast cancer Neg Hx    Colon cancer Neg Hx    Bladder Cancer Neg Hx    Kidney cancer Neg Hx     Social History:  reports that she has quit smoking. Her smoking use included cigarettes. She has a 15 pack-year smoking history. She has never used smokeless tobacco. She reports that she does not drink alcohol and does not use drugs.  ROS: Pertinent ROS in HPI  Physical Exam: BP 138/74 (BP Location: Left Arm, Patient Position: Sitting,  Cuff Size: Normal)   Pulse  68   Wt 150 lb (68 kg)   LMP 11/20/1976   SpO2 99%   BMI 25.75 kg/m   Constitutional:  Well nourished. Alert and oriented, No acute distress. HEENT: [place] AT, moist mucus membranes.  Trachea midline Cardiovascular: No clubbing, cyanosis, or edema. Respiratory: Normal respiratory effort, no increased work of breathing. Neurologic: Grossly intact, no focal deficits, moving all 4 extremities. Psychiatric: Normal mood and affect.    Laboratory Data See HPI and Epic I have reviewed the labs.   Pertinent Imaging: N/A  Assessment & Plan:    1. Genitourinary Syndrome of Menopause (GSM)  -It is difficult for patient to apply the vaginal estrogen cream as prescribed due to her memory issues  2. OAB wet -Continue [place]  IR 20 mg daily  3. IC -Cannot be on imipramine  due to her history of QT prolongation  4. rUTI's vs IC symptoms - UA suspicious for infection - urine culture is pending - She started on Macrobid  100 mg twice daily for 7 days empirically, we will adjust once urine culture results are available - Urine culture is positive we will treat with culture appropriate antibiotic and then start a low-dose daily antibiotic to see if we can prevent any recurrent UTIs in the future, she is having difficulty remembering to apply the vaginal estrogen cream, so it will likely not be possible to make the necessary changes in the vaginal mucosa to prevent UTIs in the future  5. Microscopic/gross hematuria -Schedule repeat evaluation in 2 months to confirm resolution of hematuria. -Persistent hematuria after infection clearance may indicate underlying pathology (e.g., urinary stone, urothelial malignancy). -Next steps if hematuria persists: Consider imaging (CT urogram or renal/bladder ultrasound) and cystoscopy for further evaluation. -Patient education: Discuss importance of follow-up to ensure hematuria resolves and to rule out serious causes. Reinforce that  hematuria can be infection-related but may also signal other urologic disease.   These notes generated with voice recognition software. I apologize for typographical errors.  Rachael Austin  [facility] [address]  [address] [place], [place] [address] [phone]

## 2024-09-02 ENCOUNTER — Ambulatory Visit: Admitting: Urology

## 2024-09-03 ENCOUNTER — Ambulatory Visit
Admission: EM | Admit: 2024-09-03 | Discharge: 2024-09-03 | Disposition: A | Discharge status: Home / Self Care | Attending: Student | Admitting: Student

## 2024-09-03 ENCOUNTER — Encounter: Payer: Self-pay | Admitting: Emergency Medicine

## 2024-09-03 DIAGNOSIS — N3001 Acute cystitis with hematuria: Secondary | ICD-10-CM

## 2024-09-03 DIAGNOSIS — T50905A Adverse effect of unspecified drugs, medicaments and biological substances, initial encounter: Secondary | ICD-10-CM

## 2024-09-03 DIAGNOSIS — T378X5A Adverse effect of other specified systemic anti-infectives and antiparasitics, initial encounter: Secondary | ICD-10-CM

## 2024-09-03 MED ORDER — CEFDINIR 300 MG PO CAPS: 300.0000 mg | ORAL_CAPSULE | Freq: Two times a day (BID) | ORAL | 0 refills | Status: AC

## 2024-09-03 NOTE — Discharge Instructions (Addendum)
-  Stop Macrobid .  Start cefdinir .  Twice daily for 5 days. -You have done well on cefdinir  in the past, but any medication can cause a reaction.  If you develop new symptoms like dizziness, weakness, nausea, swelling-stop the medication and seek immediate help. -We are testing your kidney function.  This lab will take 2 to 3 days, and we will call with any abnormal results. -Please call your urologist on 09/05/2024 to let them know that the medication they prescribed caused a reaction, and that we changed it.

## 2024-09-03 NOTE — ED Triage Notes (Signed)
 Patient reports feeling lightheaded, nauseated and pain all over that started yesterday. Patient states she started feeling like this after taking Nitrofurantoin  mono 100 mg Q 12 hrs. Patient states she did not take any today and after eating a meal today she states she felt better. Patient also complains of left leg cramps. Rates left leg cramps 8/10.

## 2024-09-03 NOTE — ED Provider Notes (Signed)
 [name]    CSN: [id number] Arrival date & time: 09/03/24  1126      History   Chief Complaint Chief Complaint  Patient presents with   Medication Reaction   Nausea   Leg Pain    HPI Rachael Austin is a 83 y.o. female presenting w medication reaction.  The patient was diagnosed with a urinary tract infection by her urologist on 09/01/24, and was started on nitrofurantoin  on the same day.  After completing 4 doses, she developed lightheadedness, nausea without vomiting, and generalized pain worse in the stomach and back.  She stopped the medication 1 day ago, and has not taken any today.  She reports feeling well today, other than leg cramps that developed while she was waiting in the car for this appointment.  She commonly experiences leg cramps, and these are no different than normal..  Regarding the urinary symptoms, still endorses frequency, suprapubic tenderness, nausea, and dark urine.  Per 09/01/24 note, She has heavy malodorous urine, chills going up when she voids and brown-colored urine.  Patient denies any modifying or aggravating factors.  Patient denies any recent UTI's, gross hematuria, dysuria or suprapubic/flank pain.  Patient denies any fevers, chills, nausea or vomiting.     Urinalysis is yellow slightly cloudy, specific gravity less than 1.005, pH 5.5, trace heme, nitrate positive, 2+ leukocyte, greater than 30 WBCs, 3-10 RBCs, 0-10 epithelial cells and many bacteria.    Urology note indicates no recent UTI; actually, she had a urinary tract infection 07/07/2024, which was managed with Cefdinir  with resolution.    HPI  Past Medical History:  Diagnosis Date   AK (actinic keratosis) 11/12/2022   left upper arm, tx'd with EDC   AK (actinic keratosis) 11/12/2022   right medial pretibia, LN2 11/25/22   Basal cell carcinoma 06/21/2020   left post shoulder sup, left mid chest, left lat thigh   Basal cell carcinoma 11/07/2021   L upper back, EDC    Basal cell carcinoma 11/07/2021   R upper back, EDC   Basal cell carcinoma 11/27/2021   right upper arm, EDC   Basal cell carcinoma 11/27/2021   right shoulder posterior, EDC   Basal cell carcinoma 11/27/2021   right anterior shoulder, EDC   Basal cell carcinoma 03/09/2024   posterior neck, EDC   Basal cell carcinoma (BCC) 06/13/2021   left dorsal foot, EDC 10/02/2021   Basal cell carcinoma (BCC) 06/13/2021   right postauricular tx'd w/ EDC   BCC (basal cell carcinoma of skin) 03/30/2007   R inf med pretibial - BCC   BCC (basal cell carcinoma of skin) 10/12/2013   L nasal ala - BCC   BCC (basal cell carcinoma of skin) 03/10/2018   L mid dorsum med forearm - superficial BCC    BCC (basal cell carcinoma) 10/02/2021   left dorsal foot proximal, EDC 10/02/2021   BCC (basal cell carcinoma) 10/02/2021   left mid back, superficial EDC 11/07/21   BCC (basal cell carcinoma) 10/02/2021   right pretibia   BCC (basal cell carcinoma) 10/02/2021   right mid back, superficial EDC 11/07/21   BCC (basal cell carcinoma) 08/14/2022   left distal calf, tx'd with EDC   BCC (basal cell carcinoma) 11/12/2022   superficial at left lateral pretibial,l tx'd with EDC   BCC (basal cell carcinoma) 11/12/2022   superficial at mid back right of midline, scheduled for EDC   BCC (basal cell carcinoma) 11/12/2022   mid back right of midline,  [facility] 11/25/22   Breast screening, unspecified 2013   Cancer [facility]) 1992   skin   Coronary artery calcification seen on CT scan    Degenerative disk disease    Diastolic dysfunction    a. 10/2021 Echo: EF 60-65%, no rwma, GrI DD, mild MR; b. 01/2023 Echo: EF 65-70%, no rwma, mild LVH, GrI DD Nl RV fxn. Triv MR. Mild-mod TR. AoV sclerosis.   GERD (gastroesophageal reflux disease)    History of basal cell carcinoma (BCC) 08/29/2020    left inferior knee lateral, , left inferior knee medial, right pretibia   History of SCC (squamous cell carcinoma) of skin 08/29/2020   right  posterior calf, left lateral calf, and    Hypercholesterolemia 2008   Interstitial cystitis    followed by Dr Ike   Near syncope    Other sign and symptom in breast 2013   Left upper outer quadrant breast soreness Ultrasound exam of right breast in the 2 o'clock position with the breast distracted medially showed a 0.3-0.4 with 0.5 cm simple cyst. In the 1 o'clock position where pt reported tenderness US  exam was negatiive.  Because of her history of intermittent nipple drainage, ultrasound was completed of the retroareolar area.   PAF (paroxysmal atrial fibrillation) (HCC)    a. dx 01/2023-->Eliquis  5 BID (CHA2DS2VASc = 5).   Personal history of tobacco use, presenting hazards to health    PONV (postoperative nausea and vomiting)    Primary hypertension    SCC (squamous cell carcinoma) 03/28/2008   R dorsum hand - SCC   SCC (squamous cell carcinoma) 05/09/2009   R lat lower leg - SCCIS   SCC (squamous cell carcinoma) 05/09/2009   L lat lower leg - SCCIS   SCC (squamous cell carcinoma) 04/07/2013   L lower leg - SCCIS   SCC (squamous cell carcinoma) 04/27/2013   R forearm - SCC   SCC (squamous cell carcinoma) 04/28/2017   L lat knee - SCCIS   SCC (squamous cell carcinoma) 05/12/2018   L prox dorsum forearm - SCC   SCC (squamous cell carcinoma) 06/13/2021   left forearm tx'd w/ EDC   SCC (squamous cell carcinoma), leg, right 03/30/2007   R sup pretibial - SCCIS   SCC (squamous cell carcinoma);BCC 10/12/2013   Mid back - superficial BCC with SCCIS    Special screening for malignant neoplasms, colon    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   left dorsal forearm, left lat calf   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   left lower leg superior, EDC at follow up   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   chest right of midline, tx'd with [facility]    Patient Active Problem List   Diagnosis Date Noted   Vaginal bleeding 08/23/2024   Decreased GFR  12/19/2023   Daytime somnolence 12/19/2023   UTI symptoms 09/21/2023   Hematuria 09/21/2023   Cough 08/28/2023   B12 deficiency 08/28/2023   Hair loss 07/26/2023   Hyponatremia 06/01/2023   Abnormal CXR 05/12/2023   Swelling of lower extremity 04/26/2023   Anemia 03/05/2023   Hypokalemia 02/06/2023   Subcapital fracture of hip, left, closed, initial encounter (HCC) 02/04/2023   PAF (paroxysmal atrial fibrillation) (HCC) 02/04/2023   Multifocal pneumonia 02/04/2023   History of hip fracture 02/02/2023   Hypertensive urgency 02/01/2023   Prolonged QT interval 02/01/2023   Fall 06/28/2022   Thoracic arthritis 06/26/2022   Osteoporosis 06/26/2022   At high risk for  falls 06/26/2022   Multiple closed fractures of ribs of left side 06/26/2022   Leg weakness 10/23/2021   SOB (shortness of breath) 10/22/2021   Vaginal irritation 08/13/2021   Constipation 08/13/2021   Dysuria 05/26/2021   Skin lesion 03/23/2021   Breast cancer screening 03/23/2021   Near syncope 01/29/2021   Hyperglycemia 10/18/2020   Left carotid bruit 04/07/2020   Post-nasal drainage 01/29/2020   Chest tightness 10/04/2017   Aortic atherosclerosis 07/02/2017   Special screening for malignant neoplasms, colon    Hiatal hernia    H/O: osteoarthritis 02/05/2016   H/O neoplasm 02/19/2015   H/O Malignant melanoma 02/19/2015   Essential hypertension 02/14/2015   Health care maintenance 02/14/2015   Abnormal involuntary movement 07/27/2014   Stress 05/09/2014   Abdominal pain 02/21/2014   Hearing loss 08/16/2013   GERD (gastroesophageal reflux disease) 06/17/2013   Acid reflux 06/17/2013   Detrusor dyssynergia 02/04/2013   Tremor    Hypercholesterolemia 08/09/2012   Chronic interstitial cystitis 08/09/2012    Past Surgical History:  Procedure Laterality Date   ABDOMINAL HYSTERECTOMY  1992   APPENDECTOMY     CERVICAL DISCECTOMY     S/P C7-T1 discectomy with fusion   CHOLECYSTECTOMY  1990   COLONOSCOPY  WITH PROPOFOL  N/A 02/14/2016   Procedure: COLONOSCOPY WITH PROPOFOL ;  Surgeon: Rogelia Copping, MD;  Location: [facility];  Service: Endoscopy;  Laterality: N/A;  PT WOULD LIKE 10 ARRIVAL TIME OR LATER   DILATION AND CURETTAGE OF UTERUS     ESOPHAGOGASTRODUODENOSCOPY (EGD) WITH PROPOFOL  N/A 02/14/2016   Procedure: ESOPHAGOGASTRODUODENOSCOPY (EGD) WITH PROPOFOL  with dialtion;  Surgeon: Rogelia Copping, MD;  Location: [facility];  Service: Endoscopy;  Laterality: N/A;   ESOPHAGOGASTRODUODENOSCOPY (EGD) WITH PROPOFOL  N/A 04/13/2019   Procedure: ESOPHAGOGASTRODUODENOSCOPY (EGD) WITH PROPOFOL ;  Surgeon: Toledo, Ladell POUR, MD;  Location: [facility];  Service: Gastroenterology;  Laterality: N/A;   HIP PINNING,CANNULATED Left 02/02/2023   Procedure: PERCUTANEOUS FIXATION OF FEMORAL NECK;  Surgeon: Marchia Drivers, MD;  Location: [facility];  Service: Orthopedics;  Laterality: Left;   MELANOMA EXCISION     removed from Left calf 1994    OB History     Gravida  2   Para      Term      Preterm      AB      Living  2      SAB      IAB      Ectopic      Multiple      Live Births           Obstetric Comments  Age with first menstruation-13 Age with first pregnancy-17 LMP-age 18, hysterectomy          Home Medications    Prior to Admission medications   Medication Sig Start Date End Date Taking? Authorizing Provider  cefdinir  (OMNICEF ) 300 MG capsule Take 1 capsule (300 mg total) by mouth 2 (two) times daily for 5 days. 09/03/24 09/08/24 Yes Baran Kuhrt E, PA-C  acetaminophen  (TYLENOL ) 325 MG tablet Take 650 mg by mouth as needed.    [provider]  apixaban  (ELIQUIS ) 5 MG TABS tablet Take 1 tablet by mouth twice daily 06/15/24   Vivienne Lonni Ingle, NP  carvedilol  (COREG ) 25 MG tablet TAKE 1 TABLET BY MOUTH TWICE DAILY WITH A MEAL 09/28/23   Vivienne Lonni Ingle, NP  Cholecalciferol (VITAMIN D) 125 MCG CAPS Take by mouth daily.    [provider]  conjugated  estrogens  (PREMARIN ) vaginal cream Place 1 Applicatorful vaginally daily. Apply 0.5mg  (pea-sized amount)  just inside the vaginal introitus with a finger-tip on  Monday, Wednesday and Friday nights. 07/15/23   Helon Kirsch A, PA-C  cyanocobalamin  (VITAMIN B12) 1000 MCG tablet Take 1,000 mcg by mouth daily.    [provider]  esomeprazole  (NEXIUM ) 40 MG capsule Take 1 capsule (40 mg total) by mouth daily. 05/11/24   Glendia Shad, MD  hydrALAZINE  (APRESOLINE ) 25 MG tablet Take 1 tablet (25 mg total) by mouth 2 (two) times daily. 01/28/24   Glendia Shad, MD  imipramine  (TOFRANIL ) 10 MG tablet Take 10 mg by mouth at bedtime. 06/05/23   [provider]  ipratropium (ATROVENT ) 0.03 % nasal spray Place 2 sprays into both nostrils every 12 (twelve) hours. 08/19/23   Hope Merle, MD  lovastatin  (MEVACOR ) 20 MG tablet Take 1 tablet (20 mg total) by mouth at bedtime. 08/23/24   Glendia Shad, MD  nystatin  cream (MYCOSTATIN ) Apply 1 Application topically 2 (two) times daily. 11/20/23   Glendia Shad, MD  sertraline  (ZOLOFT ) 50 MG tablet Take 1 tablet (50 mg total) by mouth daily. 08/23/24   Glendia Shad, MD  trospium  (SANCTURA ) 20 MG tablet Take 1 tablet by mouth once daily 08/30/24   McGowan, Kirsch LABOR, PA-C    Family History Family History  Problem Relation Age of Onset   Hodgkin's lymphoma Mother    Heart failure Father    Heart attack Father    Arthritis Sister        Three sisters w/ degeneratve disk disease   Headache Sister        Two sisters hx of headache   Breast cancer Neg Hx    Colon cancer Neg Hx    Bladder Cancer Neg Hx    Kidney cancer Neg Hx     Social History Social History   Tobacco Use   Smoking status: Former    Current packs/day: 1.00    Average packs/day: 1 pack/day for 15.0 years (15.0 ttl pk-yrs)    Types: Cigarettes   Smokeless tobacco: Never  Vaping Use   Vaping status: Never Used  Substance Use Topics    Alcohol use: No    Alcohol/week: 0.0 standard drinks of alcohol   Drug use: No     Allergies   Augmentin [amoxicillin-pot clavulanate], Lexapro  [escitalopram  oxalate], Macrobid  [nitrofurantoin ], Micardis  [telmisartan ], Myrbetriq [mirabegron], Niacin, Niacin and related, Solifenacin , Codeine sulfate, and Vesicare  [solifenacin  succinate]   Review of Systems Review of Systems  Constitutional:  Negative for appetite change, chills, diaphoresis and fever.  Respiratory:  Negative for shortness of breath.   Cardiovascular:  Negative for chest pain.  Gastrointestinal:  Positive for abdominal pain. Negative for blood in stool, constipation, diarrhea, nausea and vomiting.  Genitourinary:  Positive for frequency. Negative for decreased urine volume, difficulty urinating, dysuria, flank pain, genital sores, hematuria and urgency.  Musculoskeletal:  Negative for back pain.  Neurological:  Negative for dizziness, weakness and light-headedness.  All other systems reviewed and are negative.    Physical Exam Triage Vital Signs ED Triage Vitals  Encounter Vitals Group     BP      Girls Systolic BP Percentile      Girls Diastolic BP Percentile      Boys Systolic BP Percentile      Boys Diastolic BP Percentile      Pulse      Resp      Temp      Temp  src      SpO2      Weight      Height      Head Circumference      Peak Flow      Pain Score      Pain Loc      Pain Education      Exclude from Growth Chart    No data found.  Updated Vital Signs BP (!) 104/55 (BP Location: Left Arm)   Pulse 75   Temp 98.2 F (36.8 C) (Oral)   Resp 18   LMP 11/20/1976   SpO2 97%   Visual Acuity Right Eye Distance:   Left Eye Distance:   Bilateral Distance:    Right Eye Near:   Left Eye Near:    Bilateral Near:     Physical Exam Vitals reviewed.  Constitutional:      General: She is not in acute distress.    Appearance: Normal appearance. She is not ill-appearing.  HENT:     Head:  Normocephalic and atraumatic.     Mouth/Throat:     Mouth: Mucous membranes are moist.     Comments: Moist mucous membranes Eyes:     Extraocular Movements: Extraocular movements intact.     Pupils: Pupils are equal, round, and reactive to light.  Cardiovascular:     Rate and Rhythm: Normal rate and regular rhythm.     Heart sounds: Normal heart sounds.  Pulmonary:     Effort: Pulmonary effort is normal.     Breath sounds: Normal breath sounds. No wheezing, rhonchi or rales.  Abdominal:     General: Bowel sounds are normal. There is no distension.     Palpations: Abdomen is soft. There is no mass.     Tenderness: There is abdominal tenderness in the suprapubic area. There is no right CVA tenderness, left CVA tenderness, guarding or rebound. Negative signs include Murphy's sign, Rovsing's sign and McBurney's sign.     Comments: There is suprapubic tenderness with no guarding or rebound.  No CVAT.  Skin:    General: Skin is warm.     Capillary Refill: Capillary refill takes less than 2 seconds.     Comments: Good skin turgor  Neurological:     General: No focal deficit present.     Mental Status: She is alert and oriented to person, place, and time.     Comments: PERRLA, EOMI Ambulates without assistance   Psychiatric:        Mood and Affect: Mood normal.        Behavior: Behavior normal.      UC Treatments / Results  Labs (all labs ordered are listed, but only abnormal results are displayed) Labs Reviewed  BASIC METABOLIC PANEL WITH GFR    EKG   Radiology No results found.  Procedures Procedures (including critical care time)  Medications Ordered in UC Medications - No data to display  Initial Impression / Assessment and Plan / UC Course  I have reviewed the triage vital signs and the nursing notes.  Pertinent labs & imaging results that were available during my care of the patient were reviewed by me and considered in my medical decision making (see chart for  details).     Patient is a pleasant 83 y.o. female presenting with acute cystitis and medication reaction. The patient is afebrile and nontachycardic.  Antipyretic has not been administered today.  Per chart review, she was started on Macrobid  for a UTI on 09/01/2024. UA  showed RBC and nitrite.  Culture showed 100,000 colonies of E. coli.  After 4 doses of Macrobid , she developed the side effects of lightheadedness, nausea without vomiting, and generalized pain.  Since stopping the medication, she has not had the side effects any longer, and has not had any side effects today. I have updated her allergy list to reflect this.  She does not have a history of kidney disease.  Creatinine WNL and GFR very mildly reduced at 59 on 07/2024 BMP.  Creatinine clearance estimated to be 47 mL/min, based on age, gender, and weight.  She is amoxicillin allergic, but has tolerated cefdinir  in the past, as recently as 06/2024.  Following discussion, we will stop the Macrobid , and start the Omnicef .  Encouraged good hydration.  She will call her urologist on next business day (09/05/2024) to let them know the change in treatment.   Final Clinical Impressions(s) / UC Diagnoses   Final diagnoses:  Acute cystitis with hematuria  Adverse effect of drug, initial encounter     Discharge Instructions      -Stop Macrobid .  Start cefdinir .  Twice daily for 5 days. -You have done well on cefdinir  in the past, but any medication can cause a reaction.  If you develop new symptoms like dizziness, weakness, nausea, swelling-stop the medication and seek immediate help. -We are testing your kidney function.  This lab will take 2 to 3 days, and we will call with any abnormal results. -Please call your urologist on 09/05/2024 to let them know that the medication they prescribed caused a reaction, and that we changed it.     ED Prescriptions     Medication Sig Dispense Auth. Provider   cefdinir  (OMNICEF ) 300 MG capsule  Take 1 capsule (300 mg total) by mouth 2 (two) times daily for 5 days. 10 capsule Abdias Hickam E, PA-C      PDMP not reviewed this encounter.   Arlyss Leita BRAVO, PA-C 09/03/24 [phone]

## 2024-09-04 ENCOUNTER — Telehealth: Payer: Self-pay | Admitting: Student

## 2024-09-04 ENCOUNTER — Telehealth: Payer: Self-pay | Admitting: Emergency Medicine

## 2024-09-04 LAB — BASIC METABOLIC PANEL WITH GFR
BUN/Creatinine Ratio: 7 — ABNORMAL LOW (ref 12–28)
BUN: 8 mg/dL (ref 8–27)
CO2: 21 mmol/L (ref 20–29)
Calcium: 9 mg/dL (ref 8.7–10.3)
Chloride: 94 mmol/L — ABNORMAL LOW (ref 96–106)
Creatinine, Ser: 1.17 mg/dL — ABNORMAL HIGH (ref 0.57–1.00)
Glucose: 143 mg/dL — ABNORMAL HIGH (ref 70–99)
Potassium: 4.3 mmol/L (ref 3.5–5.2)
Sodium: 129 mmol/L — ABNORMAL LOW (ref 134–144)
eGFR: 46 mL/min/1.73 — ABNORMAL LOW (ref >59)

## 2024-09-04 NOTE — Telephone Encounter (Signed)
 Confirmed patient's identity using two identifiers.   We discussed her reduced kidney function on BMP. This is most likely due to the Macrobid  that she was prescribed for her UTI. Her last dose of Macrobid  was 09/02/2024. She is feeling much better today. She denies pain, including abdominal pain, back pain.  She denies lightheadedness, nausea, vomiting.  She understands that this lab needs to be repeated within 3 days, and that she needs to call her primary care and her urologist to get this accomplished.  If she develops new symptoms, she understands to head to the emergency department.

## 2024-09-05 ENCOUNTER — Ambulatory Visit (HOSPITAL_COMMUNITY): Payer: Self-pay

## 2024-09-06 LAB — CULTURE, URINE COMPREHENSIVE

## 2024-09-07 ENCOUNTER — Ambulatory Visit: Payer: Self-pay | Admitting: Urology

## 2024-09-07 NOTE — Telephone Encounter (Signed)
-----   Message from [facility] sent at 09/07/2024  9:52 AM EST ----- Please let Mrs. Jurgens know that her urine culture was positive for infection.  I saw that see was seen in the ED after having a reaction to the Macrobid  and was switched to Omnicef .  I hope she is  feeling better and we will see her in February for an office visit.  She will need a repeat BMP by her PCP or us  next week, because her kidney function was decreased in the ED.  We need to make sure  her kidneys have recovered.  ----- Message ----- From: Rebecka Memos Lab Results In Sent: 09/01/2024   4:36 PM EST To: Clotilda DELENA Cornwall, PA-C

## 2024-09-07 NOTE — Telephone Encounter (Signed)
 Advised patient of urine culture results per S. McGowan. Patient states that she is feeling better since the ED changed her antibiotic. Patient advised that she has an upcoming appointment with her PCP and will have the PCP do blood work. Patient verbalized understanding of information given.  T.Cary Lothrop LPN

## 2024-09-14 ENCOUNTER — Other Ambulatory Visit: Payer: Self-pay | Admitting: Nurse Practitioner

## 2024-10-10 ENCOUNTER — Telehealth: Payer: Self-pay | Admitting: Pharmacy Technician

## 2024-10-10 ENCOUNTER — Other Ambulatory Visit: Payer: Self-pay

## 2024-10-10 MED ORDER — CARVEDILOL 25 MG PO TABS: 25.0000 mg | ORAL_TABLET | Freq: Two times a day (BID) | ORAL | 0 refills | Status: AC

## 2024-10-10 NOTE — Telephone Encounter (Signed)
 Hi, we received a refill request for carvedilol  from [facility]. Thank you!

## 2024-10-11 ENCOUNTER — Encounter: Admitting: Dermatology

## 2024-10-12 ENCOUNTER — Ambulatory Visit: Admitting: Internal Medicine

## 2024-10-12 DIAGNOSIS — R739 Hyperglycemia, unspecified: Secondary | ICD-10-CM

## 2024-10-12 DIAGNOSIS — E7800 Pure hypercholesterolemia, unspecified: Secondary | ICD-10-CM

## 2024-10-12 DIAGNOSIS — D649 Anemia, unspecified: Secondary | ICD-10-CM

## 2024-10-26 ENCOUNTER — Ambulatory Visit: Admitting: Internal Medicine

## 2024-11-02 ENCOUNTER — Ambulatory Visit: Admitting: Urology

## 2024-11-02 DIAGNOSIS — N390 Urinary tract infection, site not specified: Secondary | ICD-10-CM

## 2024-11-02 DIAGNOSIS — R310 Gross hematuria: Secondary | ICD-10-CM

## 2024-11-02 DIAGNOSIS — N3010 Interstitial cystitis (chronic) without hematuria: Secondary | ICD-10-CM

## 2024-11-02 DIAGNOSIS — N958 Other specified menopausal and perimenopausal disorders: Secondary | ICD-10-CM

## 2024-11-02 DIAGNOSIS — N3281 Overactive bladder: Secondary | ICD-10-CM

## 2024-11-22 ENCOUNTER — Encounter: Admitting: Dermatology

## 2024-12-19 ENCOUNTER — Ambulatory Visit: Admitting: Internal Medicine

## 2025-09-05 ENCOUNTER — Ambulatory Visit
